# Patient Record
Sex: Female | Born: 1935
Health system: Southern US, Community
[De-identification: ages and names within clinical notes are randomized; demographics above are authoritative.]

## PROBLEM LIST (undated history)

## (undated) DIAGNOSIS — K279 Peptic ulcer, site unspecified, unspecified as acute or chronic, without hemorrhage or perforation: Secondary | ICD-10-CM

## (undated) DIAGNOSIS — R0600 Dyspnea, unspecified: Secondary | ICD-10-CM

## (undated) DIAGNOSIS — M1990 Unspecified osteoarthritis, unspecified site: Secondary | ICD-10-CM

## (undated) DIAGNOSIS — N189 Chronic kidney disease, unspecified: Secondary | ICD-10-CM

## (undated) DIAGNOSIS — Z8639 Personal history of other endocrine, nutritional and metabolic disease: Secondary | ICD-10-CM

## (undated) DIAGNOSIS — I350 Nonrheumatic aortic (valve) stenosis: Secondary | ICD-10-CM

## (undated) DIAGNOSIS — F419 Anxiety disorder, unspecified: Secondary | ICD-10-CM

## (undated) DIAGNOSIS — D649 Anemia, unspecified: Secondary | ICD-10-CM

## (undated) DIAGNOSIS — I509 Heart failure, unspecified: Secondary | ICD-10-CM

## (undated) DIAGNOSIS — I739 Peripheral vascular disease, unspecified: Secondary | ICD-10-CM

## (undated) DIAGNOSIS — K922 Gastrointestinal hemorrhage, unspecified: Secondary | ICD-10-CM

## (undated) DIAGNOSIS — K219 Gastro-esophageal reflux disease without esophagitis: Secondary | ICD-10-CM

## (undated) DIAGNOSIS — R519 Headache, unspecified: Secondary | ICD-10-CM

## (undated) DIAGNOSIS — E785 Hyperlipidemia, unspecified: Secondary | ICD-10-CM

## (undated) DIAGNOSIS — I4891 Unspecified atrial fibrillation: Secondary | ICD-10-CM

## (undated) DIAGNOSIS — N2581 Secondary hyperparathyroidism of renal origin: Secondary | ICD-10-CM

## (undated) DIAGNOSIS — I10 Essential (primary) hypertension: Secondary | ICD-10-CM

## (undated) DIAGNOSIS — R51 Headache: Secondary | ICD-10-CM

## (undated) HISTORY — DX: Secondary hyperparathyroidism of renal origin: N25.81

## (undated) HISTORY — PX: CHOLECYSTECTOMY: SHX55

## (undated) HISTORY — DX: Anemia, unspecified: D64.9

## (undated) HISTORY — DX: Personal history of other endocrine, nutritional and metabolic disease: Z86.39

## (undated) HISTORY — PX: ABDOMINAL HYSTERECTOMY: SHX81

## (undated) HISTORY — PX: EYE SURGERY: SHX253

## (undated) HISTORY — DX: Essential (primary) hypertension: I10

## (undated) HISTORY — PX: COLON SURGERY: SHX602

## (undated) HISTORY — DX: Chronic kidney disease, unspecified: N18.9

---

## 2008-12-06 ENCOUNTER — Ambulatory Visit (HOSPITAL_COMMUNITY): Admission: RE | Admit: 2008-12-06 | Discharge: 2008-12-06 | Payer: Self-pay | Admitting: Nephrology

## 2011-12-11 ENCOUNTER — Encounter (HOSPITAL_COMMUNITY): Payer: Medicare Other | Attending: Nephrology

## 2011-12-11 VITALS — BP 162/78 | HR 67 | Temp 98.5°F

## 2011-12-11 DIAGNOSIS — D631 Anemia in chronic kidney disease: Secondary | ICD-10-CM | POA: Insufficient documentation

## 2011-12-11 MED ORDER — EPOETIN ALFA 10000 UNIT/ML IJ SOLN
6000.0000 [IU] | Freq: Once | INTRAMUSCULAR | Status: AC
  Administered 2011-12-11: 6000 [IU] via SUBCUTANEOUS

## 2011-12-11 NOTE — Progress Notes (Signed)
Patricia Ayala presents today for injection per MD orders. Procrit 6000 administered SQ in right Abdomen. Administration without incident. Patient tolerated well.

## 2011-12-25 ENCOUNTER — Encounter (HOSPITAL_COMMUNITY): Payer: Medicare Other

## 2011-12-25 ENCOUNTER — Other Ambulatory Visit (HOSPITAL_COMMUNITY): Payer: Medicare Other

## 2011-12-25 VITALS — BP 131/65 | HR 67

## 2011-12-25 DIAGNOSIS — D649 Anemia, unspecified: Secondary | ICD-10-CM

## 2011-12-25 LAB — RENAL FUNCTION PANEL
CO2: 18 mEq/L — ABNORMAL LOW (ref 19–32)
Calcium: 7.6 mg/dL — ABNORMAL LOW (ref 8.4–10.5)
Chloride: 107 mEq/L (ref 96–112)
Creatinine, Ser: 6.25 mg/dL — ABNORMAL HIGH (ref 0.50–1.10)
Glucose, Bld: 104 mg/dL — ABNORMAL HIGH (ref 70–99)

## 2011-12-25 MED ORDER — EPOETIN ALFA 10000 UNIT/ML IJ SOLN
6000.0000 [IU] | Freq: Once | INTRAMUSCULAR | Status: AC
  Administered 2011-12-25: 6000 [IU] via SUBCUTANEOUS

## 2011-12-25 NOTE — Progress Notes (Signed)
[name] presents today for injection per MD orders. Procrit 6,000 units administered SQ in right Abdomen.  Blood drawn via venipuncture for labs ordered per Dr. Fausto Skillern. Administration without incident. Patient tolerated well.

## 2012-01-07 ENCOUNTER — Encounter (HOSPITAL_COMMUNITY): Payer: Medicare Other

## 2012-01-07 ENCOUNTER — Encounter (HOSPITAL_COMMUNITY): Payer: Medicare Other | Attending: Nephrology

## 2012-01-07 VITALS — BP 129/70 | HR 69

## 2012-01-07 DIAGNOSIS — N289 Disorder of kidney and ureter, unspecified: Secondary | ICD-10-CM | POA: Insufficient documentation

## 2012-01-07 DIAGNOSIS — D649 Anemia, unspecified: Secondary | ICD-10-CM | POA: Insufficient documentation

## 2012-01-07 MED ORDER — EPOETIN ALFA 10000 UNIT/ML IJ SOLN
6000.0000 [IU] | Freq: Once | INTRAMUSCULAR | Status: AC
  Administered 2012-01-07: 6000 [IU] via SUBCUTANEOUS
  Filled 2012-01-07: qty 1

## 2012-01-07 NOTE — Progress Notes (Signed)
[name] presents today for injection per MD orders. Procrit 6000 units administered SQ in right Abdomen. Administration without incident. Patient tolerated well.

## 2012-01-08 ENCOUNTER — Other Ambulatory Visit (HOSPITAL_COMMUNITY): Payer: Medicare Other

## 2012-01-21 ENCOUNTER — Ambulatory Visit (HOSPITAL_COMMUNITY): Payer: Medicare Other

## 2012-02-11 ENCOUNTER — Encounter (HOSPITAL_COMMUNITY): Payer: Medicare Other | Attending: Nephrology

## 2012-02-11 ENCOUNTER — Encounter (HOSPITAL_COMMUNITY): Payer: Self-pay

## 2012-02-11 VITALS — BP 145/73 | HR 66

## 2012-02-11 DIAGNOSIS — D649 Anemia, unspecified: Secondary | ICD-10-CM

## 2012-02-11 HISTORY — DX: Anemia, unspecified: D64.9

## 2012-02-11 MED ORDER — EPOETIN ALFA 10000 UNIT/ML IJ SOLN
6000.0000 [IU] | Freq: Once | INTRAMUSCULAR | Status: AC
  Administered 2012-02-11: 6000 [IU] via SUBCUTANEOUS

## 2012-02-11 NOTE — Progress Notes (Signed)
Tolerated well

## 2012-02-25 ENCOUNTER — Encounter (HOSPITAL_COMMUNITY): Payer: Medicare Other

## 2012-02-25 VITALS — BP 137/77 | HR 62

## 2012-02-25 DIAGNOSIS — D649 Anemia, unspecified: Secondary | ICD-10-CM

## 2012-02-25 MED ORDER — EPOETIN ALFA 10000 UNIT/ML IJ SOLN
6000.0000 [IU] | Freq: Once | INTRAMUSCULAR | Status: AC
  Administered 2012-02-25: 6000 [IU] via SUBCUTANEOUS

## 2012-02-25 NOTE — Progress Notes (Signed)
Patricia Ayala presents today for injection per MD orders. Procrit 6000 units administered SQ in right Abdomen. Administration without incident. Patient tolerated well. Per pt and her husband she has her lab work done at [facility] office and just had it done last week and is going again this week.

## 2012-03-06 ENCOUNTER — Other Ambulatory Visit: Payer: Self-pay

## 2012-03-06 DIAGNOSIS — Z01810 Encounter for preprocedural cardiovascular examination: Secondary | ICD-10-CM

## 2012-03-06 DIAGNOSIS — N185 Chronic kidney disease, stage 5: Secondary | ICD-10-CM

## 2012-03-10 ENCOUNTER — Ambulatory Visit (HOSPITAL_COMMUNITY): Payer: Medicare Other

## 2012-03-11 ENCOUNTER — Encounter (HOSPITAL_COMMUNITY): Payer: Medicare Other | Attending: Nephrology

## 2012-03-11 VITALS — BP 139/75 | HR 60

## 2012-03-11 DIAGNOSIS — D649 Anemia, unspecified: Secondary | ICD-10-CM | POA: Insufficient documentation

## 2012-03-11 MED ORDER — EPOETIN ALFA 10000 UNIT/ML IJ SOLN
10000.0000 [IU] | INTRAMUSCULAR | Status: DC
  Administered 2012-03-11: 6000 [IU] via SUBCUTANEOUS

## 2012-03-11 NOTE — Progress Notes (Signed)
Tolerated injection well. 

## 2012-03-14 ENCOUNTER — Encounter: Payer: Self-pay | Admitting: Vascular Surgery

## 2012-03-25 ENCOUNTER — Ambulatory Visit (HOSPITAL_COMMUNITY): Payer: Medicare Other

## 2012-03-26 ENCOUNTER — Encounter (HOSPITAL_COMMUNITY): Payer: Medicare Other

## 2012-03-26 ENCOUNTER — Encounter: Payer: Self-pay | Admitting: Vascular Surgery

## 2012-03-26 VITALS — BP 133/68 | HR 69

## 2012-03-26 DIAGNOSIS — D649 Anemia, unspecified: Secondary | ICD-10-CM

## 2012-03-26 MED ORDER — EPOETIN ALFA 10000 UNIT/ML IJ SOLN
10000.0000 [IU] | Freq: Once | INTRAMUSCULAR | Status: AC
  Administered 2012-03-26: 6000 [IU] via SUBCUTANEOUS

## 2012-03-26 NOTE — Progress Notes (Signed)
VSS,  Tolerated injection well.  Refused lab work.  Stated she will "get it done at Dr. Susa Griffins office."

## 2012-03-27 ENCOUNTER — Encounter: Payer: Self-pay | Admitting: Vascular Surgery

## 2012-03-27 ENCOUNTER — Encounter (INDEPENDENT_AMBULATORY_CARE_PROVIDER_SITE_OTHER): Payer: Medicare Other | Admitting: Vascular Surgery

## 2012-03-27 ENCOUNTER — Ambulatory Visit (INDEPENDENT_AMBULATORY_CARE_PROVIDER_SITE_OTHER): Payer: Medicare Other | Admitting: Vascular Surgery

## 2012-03-27 VITALS — BP 161/77 | HR 66 | Temp 98.0°F | Ht 64.0 in | Wt 178.0 lb

## 2012-03-27 DIAGNOSIS — N186 End stage renal disease: Secondary | ICD-10-CM

## 2012-03-27 DIAGNOSIS — N185 Chronic kidney disease, stage 5: Secondary | ICD-10-CM

## 2012-03-27 DIAGNOSIS — Z01810 Encounter for preprocedural cardiovascular examination: Secondary | ICD-10-CM

## 2012-03-27 NOTE — Progress Notes (Signed)
[facility] HISTORY AND PHYSICAL   History of Present Illness:  Patient is a 78 y.o. year old female who presents for placement of a permanent hemodialysis access. The patient is right handed .  The patient is not currently on hemodialysis.  Other chronic medical problems include anemia, hypertension, hyperparathyroidism. These problems are currently controlled.  She is referred by Dr. Befakadu  Past Medical History  Diagnosis Date  . Anemia 02/11/2012  . Chronic kidney disease   . Hypertension   . Vitamin d deficiency   . Hyperparathyroidism, secondary   . H/O metabolic acidosis     History reviewed. No pertinent past surgical history.   Social History History  Substance Use Topics  . Smoking status: Never Smoker   . Smokeless tobacco: Never Used  . Alcohol Use: No    Family History History reviewed. No pertinent family history.  Allergies  No Known Allergies   Current Outpatient Prescriptions  Medication Sig Dispense Refill  . amLODipine (NORVASC) 10 MG tablet Take 10 mg by mouth daily.      . calcitRIOL (ROCALTROL) 0.5 MCG capsule Take 0.5 mcg by mouth daily.      . ergocalciferol (VITAMIN D2) 50000 UNITS capsule Take 50,000 Units by mouth once a week.      . labetalol (NORMODYNE) 200 MG tablet Take 200 mg by mouth 2 (two) times daily.      . promethazine (PHENERGAN) 25 MG tablet Take 25 mg by mouth every 6 (six) hours as needed.      . torsemide (DEMADEX) 20 MG tablet Take 20 mg by mouth daily. 2 tablets      . ALPRAZolam (XANAX) 0.5 MG tablet Take 0.5 mg by mouth at bedtime as needed.      . amLODipine (NORVASC) 5 MG tablet Take 5 mg by mouth daily.      . calcitRIOL (ROCALTROL) 1 MCG/ML solution Take 0.25 mcg by mouth daily.      . doxazosin (CARDURA) 2 MG tablet Take 2 mg by mouth at bedtime.      . triazolam (HALCION) 0.25 MG tablet Take 0.25 mg by mouth at bedtime as needed.        ROS:   General:  No weight loss, Fever,  chills  HEENT: No recent headaches, no nasal bleeding, no visual changes, no sore throat  Neurologic: No dizziness, blackouts, seizures. No recent symptoms of stroke or mini- stroke. No recent episodes of slurred speech, or temporary blindness.  Cardiac: No recent episodes of chest pain/pressure, no shortness of breath at rest.  No shortness of breath with exertion.  Denies history of atrial fibrillation or irregular heartbeat  Vascular: No history of rest pain in feet.  No history of claudication.  No history of non-healing ulcer, No history of DVT   Pulmonary: No home oxygen, no productive cough, no hemoptysis,  No asthma or wheezing  Musculoskeletal:  [ ] Arthritis, [ ] Low back pain,  [ ] Joint pain  Hematologic:No history of hypercoagulable state.  No history of easy bleeding.  No history of anemia  Gastrointestinal: No hematochezia or melena,  No gastroesophageal reflux, no trouble swallowing  Urinary: [x ] chronic Kidney disease, [ ] on HD - [ ] MWF or [ ] TTHS, [ ] Burning with urination, [ ] Frequent urination, [ ] Difficulty urinating;   Skin: No rashes  Psychological: No history of anxiety,  No history of depression   Physical Examination  Filed Vitals:     03/27/12 1552  BP: 161/77  Pulse: 66  Temp: 98 F (36.7 C)  TempSrc: Oral  Height: 5' 4" (1.626 m)  Weight: 178 lb (80.74 kg)    Body mass index is 30.55 kg/(m^2).  General:  Alert and oriented, no acute distress HEENT: Normal Neck: No bruit or JVD Pulmonary: Clear to auscultation bilaterally Cardiac: Regular Rate and Rhythm without murmur Gastrointestinal: Soft, non-tender, non-distended, no mass Skin: No rash Extremity Pulses:  2+ radial, brachial pulses bilaterally Musculoskeletal: No deformity or edema  Neurologic: Upper and lower extremity motor 5/5 and symmetric  DATA: She had a vein mapping ultrasound today which I reviewed and interpreted. The basilic vein was of reasonable quality between 3 and  5 mm bilaterally. The cephalic vein was fairly small bilaterally. However, on placement of a tourniquet on the left upper arm the cephalic vein was of reasonable quality   ASSESSMENT: Left brachiocephalic AV fistula Tuesday, 04/01/2012. Risks benefits possible complications and procedure details were explained to the patient and her family today. She understands and agrees to proceed. These include but are not limited to bleeding infection ischemic steal non-maturation of the fistula.   PLAN: See above  Kiora Hallberg, MD [facility] Office: [phone] Pager: [phone]  

## 2012-03-28 ENCOUNTER — Other Ambulatory Visit: Payer: Self-pay

## 2012-03-31 ENCOUNTER — Encounter (HOSPITAL_COMMUNITY): Payer: Self-pay | Admitting: Pharmacy Technician

## 2012-03-31 ENCOUNTER — Encounter (HOSPITAL_COMMUNITY): Payer: Self-pay | Admitting: *Deleted

## 2012-03-31 MED ORDER — CEFAZOLIN SODIUM 1-5 GM-% IV SOLN
1.0000 g | Freq: Once | INTRAVENOUS | Status: DC
  Filled 2012-03-31: qty 50

## 2012-03-31 MED ORDER — SODIUM CHLORIDE 0.9 % IV SOLN
INTRAVENOUS | Status: DC
  Administered 2012-04-01: 08:00:00 via INTRAVENOUS

## 2012-04-01 ENCOUNTER — Telehealth: Payer: Self-pay | Admitting: Vascular Surgery

## 2012-04-01 ENCOUNTER — Encounter (HOSPITAL_COMMUNITY)
Admission: RE | Disposition: A | Discharge status: Home / Self Care | Payer: Self-pay | Source: Ambulatory Visit | Attending: Vascular Surgery

## 2012-04-01 ENCOUNTER — Other Ambulatory Visit: Payer: Self-pay

## 2012-04-01 ENCOUNTER — Encounter (HOSPITAL_COMMUNITY): Payer: Self-pay

## 2012-04-01 ENCOUNTER — Ambulatory Visit (HOSPITAL_COMMUNITY): Payer: Medicare Other

## 2012-04-01 ENCOUNTER — Ambulatory Visit (HOSPITAL_COMMUNITY): Payer: Medicare Other | Admitting: Anesthesiology

## 2012-04-01 ENCOUNTER — Encounter (HOSPITAL_COMMUNITY): Payer: Self-pay | Admitting: Anesthesiology

## 2012-04-01 ENCOUNTER — Ambulatory Visit (HOSPITAL_COMMUNITY)
Admission: RE | Admit: 2012-04-01 | Discharge: 2012-04-01 | Disposition: A | Discharge status: Home / Self Care | Payer: Medicare Other | Source: Ambulatory Visit | Attending: Vascular Surgery | Admitting: Vascular Surgery

## 2012-04-01 DIAGNOSIS — I120 Hypertensive chronic kidney disease with stage 5 chronic kidney disease or end stage renal disease: Secondary | ICD-10-CM | POA: Insufficient documentation

## 2012-04-01 DIAGNOSIS — N2581 Secondary hyperparathyroidism of renal origin: Secondary | ICD-10-CM | POA: Insufficient documentation

## 2012-04-01 DIAGNOSIS — N186 End stage renal disease: Secondary | ICD-10-CM

## 2012-04-01 DIAGNOSIS — K219 Gastro-esophageal reflux disease without esophagitis: Secondary | ICD-10-CM | POA: Insufficient documentation

## 2012-04-01 HISTORY — PX: AV FISTULA PLACEMENT: SHX1204

## 2012-04-01 LAB — SURGICAL PCR SCREEN: Staphylococcus aureus: NEGATIVE

## 2012-04-01 SURGERY — ARTERIOVENOUS (AV) FISTULA CREATION
Anesthesia: General | Site: Arm Upper | Laterality: Left | Wound class: Clean

## 2012-04-01 MED ORDER — PHENYLEPHRINE HCL 10 MG/ML IJ SOLN
INTRAMUSCULAR | Status: DC | PRN
  Administered 2012-04-01 (×2): 40 ug via INTRAVENOUS

## 2012-04-01 MED ORDER — MUPIROCIN 2 % EX OINT
TOPICAL_OINTMENT | CUTANEOUS | Status: AC
  Administered 2012-04-01: 1 via NASAL
  Filled 2012-04-01: qty 22

## 2012-04-01 MED ORDER — 0.9 % SODIUM CHLORIDE (POUR BTL) OPTIME
TOPICAL | Status: DC | PRN
  Administered 2012-04-01: 1000 mL

## 2012-04-01 MED ORDER — HEPARIN SODIUM (PORCINE) 5000 UNIT/ML IJ SOLN
INTRAMUSCULAR | Status: DC | PRN
  Administered 2012-04-01: 09:00:00

## 2012-04-01 MED ORDER — FENTANYL CITRATE 0.05 MG/ML IJ SOLN
INTRAMUSCULAR | Status: DC | PRN
  Administered 2012-04-01: 25 ug via INTRAVENOUS
  Administered 2012-04-01: 75 ug via INTRAVENOUS

## 2012-04-01 MED ORDER — EPHEDRINE SULFATE 50 MG/ML IJ SOLN
INTRAMUSCULAR | Status: DC | PRN
  Administered 2012-04-01 (×4): 5 mg via INTRAVENOUS

## 2012-04-01 MED ORDER — OXYCODONE HCL 5 MG PO TABS: 5.0000 mg | ORAL_TABLET | Freq: Four times a day (QID) | ORAL | Status: DC | PRN

## 2012-04-01 MED ORDER — GLYCOPYRROLATE 0.2 MG/ML IJ SOLN
INTRAMUSCULAR | Status: DC | PRN
  Administered 2012-04-01: 0.2 mg via INTRAVENOUS

## 2012-04-01 MED ORDER — ONDANSETRON HCL 4 MG/2ML IJ SOLN: 4.0000 mg | Freq: Once | INTRAMUSCULAR | Status: DC | PRN

## 2012-04-01 MED ORDER — CEFAZOLIN SODIUM-DEXTROSE 2-3 GM-% IV SOLR
INTRAVENOUS | Status: AC
  Filled 2012-04-01: qty 50

## 2012-04-01 MED ORDER — HYDROMORPHONE HCL PF 1 MG/ML IJ SOLN: 0.2500 mg | INTRAMUSCULAR | Status: DC | PRN

## 2012-04-01 MED ORDER — HEPARIN SODIUM (PORCINE) 1000 UNIT/ML IJ SOLN
INTRAMUSCULAR | Status: DC | PRN
  Administered 2012-04-01: 5000 [IU] via INTRAVENOUS

## 2012-04-01 MED ORDER — CEFAZOLIN SODIUM-DEXTROSE 2-3 GM-% IV SOLR
2.0000 g | Freq: Once | INTRAVENOUS | Status: AC
  Administered 2012-04-01: 2 g via INTRAVENOUS

## 2012-04-01 MED ORDER — ONDANSETRON HCL 4 MG/2ML IJ SOLN
INTRAMUSCULAR | Status: DC | PRN
  Administered 2012-04-01: 4 mg via INTRAVENOUS

## 2012-04-01 MED ORDER — LIDOCAINE HCL (CARDIAC) 20 MG/ML IV SOLN
INTRAVENOUS | Status: DC | PRN
  Administered 2012-04-01: 100 mg via INTRAVENOUS

## 2012-04-01 MED ORDER — LIDOCAINE HCL (PF) 1 % IJ SOLN
INTRAMUSCULAR | Status: DC | PRN
  Administered 2012-04-01: 7 mL

## 2012-04-01 MED ORDER — MUPIROCIN 2 % EX OINT: TOPICAL_OINTMENT | Freq: Two times a day (BID) | CUTANEOUS | Status: DC

## 2012-04-01 MED ORDER — PROPOFOL 10 MG/ML IV EMUL
INTRAVENOUS | Status: DC | PRN
  Administered 2012-04-01: 110 mg via INTRAVENOUS

## 2012-04-01 SURGICAL SUPPLY — 43 items
CANISTER SUCTION 2500CC (MISCELLANEOUS) ×2 IMPLANT
CLIP TI MEDIUM 6 (CLIP) ×2 IMPLANT
CLIP TI WIDE RED SMALL 6 (CLIP) ×2 IMPLANT
CLOTH BEACON ORANGE TIMEOUT ST (SAFETY) ×2 IMPLANT
COVER PROBE W GEL 5X96 (DRAPES) ×2 IMPLANT
COVER SURGICAL LIGHT HANDLE (MISCELLANEOUS) ×2 IMPLANT
DECANTER SPIKE VIAL GLASS SM (MISCELLANEOUS) ×2 IMPLANT
DERMABOND ADVANCED (GAUZE/BANDAGES/DRESSINGS) ×1
DERMABOND ADVANCED .7 DNX12 (GAUZE/BANDAGES/DRESSINGS) ×1 IMPLANT
DRAIN PENROSE 1/4X12 LTX STRL (WOUND CARE) ×2 IMPLANT
ELECT REM PT RETURN 9FT ADLT (ELECTROSURGICAL) ×2
ELECTRODE REM PT RTRN 9FT ADLT (ELECTROSURGICAL) ×1 IMPLANT
GAUZE SPONGE 2X2 8PLY STRL LF (GAUZE/BANDAGES/DRESSINGS) IMPLANT
GEL ULTRASOUND 20GR AQUASONIC (MISCELLANEOUS) IMPLANT
GLOVE BIO SURGEON STRL SZ 6.5 (GLOVE) ×4 IMPLANT
GLOVE BIO SURGEON STRL SZ7.5 (GLOVE) ×2 IMPLANT
GLOVE BIOGEL PI IND STRL 6.5 (GLOVE) ×2 IMPLANT
GLOVE BIOGEL PI IND STRL 7.0 (GLOVE) ×1 IMPLANT
GLOVE BIOGEL PI IND STRL 7.5 (GLOVE) ×2 IMPLANT
GLOVE BIOGEL PI INDICATOR 6.5 (GLOVE) ×2
GLOVE BIOGEL PI INDICATOR 7.0 (GLOVE) ×1
GLOVE BIOGEL PI INDICATOR 7.5 (GLOVE) ×2
GLOVE ECLIPSE 6.5 STRL STRAW (GLOVE) ×2 IMPLANT
GLOVE SURG SS PI 7.5 STRL IVOR (GLOVE) ×4 IMPLANT
GOWN EXTRA PROTECTION XXL 0583 (GOWNS) ×2 IMPLANT
GOWN PREVENTION PLUS XLARGE (GOWN DISPOSABLE) ×2 IMPLANT
GOWN STRL NON-REIN LRG LVL3 (GOWN DISPOSABLE) ×4 IMPLANT
KIT BASIN OR (CUSTOM PROCEDURE TRAY) ×2 IMPLANT
KIT ROOM TURNOVER OR (KITS) ×2 IMPLANT
LOOP VESSEL MINI RED (MISCELLANEOUS) IMPLANT
NS IRRIG 1000ML POUR BTL (IV SOLUTION) ×2 IMPLANT
PACK CV ACCESS (CUSTOM PROCEDURE TRAY) ×2 IMPLANT
PAD ARMBOARD 7.5X6 YLW CONV (MISCELLANEOUS) ×4 IMPLANT
SPONGE GAUZE 2X2 STER 10/PKG (GAUZE/BANDAGES/DRESSINGS)
SPONGE SURGIFOAM ABS GEL 100 (HEMOSTASIS) IMPLANT
SUT PROLENE 7 0 BV 1 (SUTURE) ×2 IMPLANT
SUT VIC AB 3-0 SH 27 (SUTURE) ×1
SUT VIC AB 3-0 SH 27X BRD (SUTURE) ×1 IMPLANT
SUT VICRYL 4-0 PS2 18IN ABS (SUTURE) ×2 IMPLANT
TOWEL OR 17X24 6PK STRL BLUE (TOWEL DISPOSABLE) ×2 IMPLANT
TOWEL OR 17X26 10 PK STRL BLUE (TOWEL DISPOSABLE) ×2 IMPLANT
UNDERPAD 30X30 INCONTINENT (UNDERPADS AND DIAPERS) ×2 IMPLANT
WATER STERILE IRR 1000ML POUR (IV SOLUTION) ×2 IMPLANT

## 2012-04-01 NOTE — Anesthesia Postprocedure Evaluation (Signed)
  Anesthesia Post-op Note  Patient: Patricia Ayala  Procedure(s) Performed: Procedure(s) (LRB): ARTERIOVENOUS (AV) FISTULA CREATION (Left)  Patient Location: PACU  Anesthesia Type: General  Level of Consciousness: awake, alert , oriented and patient cooperative  Airway and Oxygen Therapy: Patient Spontanous Breathing and Patient connected to nasal cannula oxygen  Post-op Pain: mild  Post-op Assessment: Post-op Vital signs reviewed, Patient's Cardiovascular Status Stable, Respiratory Function Stable, Patent Airway, No signs of Nausea or vomiting and Pain level controlled  Post-op Vital Signs: stable  Complications: No apparent anesthesia complications

## 2012-04-01 NOTE — Transfer of Care (Signed)
Immediate Anesthesia Transfer of Care Note  Patient: Patricia Ayala  Procedure(s) Performed: Procedure(s) (LRB): ARTERIOVENOUS (AV) FISTULA CREATION (Left)  Patient Location: PACU  Anesthesia Type: General  Level of Consciousness: awake, oriented and patient cooperative  Airway & Oxygen Therapy: Patient Spontanous Breathing and Patient connected to nasal cannula oxygen  Post-op Assessment: Report given to PACU RN and Post -op Vital signs reviewed and stable  Post vital signs: Reviewed  Complications: No apparent anesthesia complications

## 2012-04-01 NOTE — Anesthesia Procedure Notes (Signed)
Procedure Name: LMA Insertion Date/Time: 04/01/2012 8:44 AM Performed by: Romie Minus K Pre-anesthesia Checklist: Patient identified, Emergency Drugs available, Suction available, Patient being monitored and Timeout performed Patient Re-evaluated:Patient Re-evaluated prior to inductionOxygen Delivery Method: Circle system utilized Preoxygenation: Pre-oxygenation with 100% oxygen Intubation Type: IV induction Ventilation: Mask ventilation without difficulty LMA: LMA inserted LMA Size: 4.0 Number of attempts: 1 Placement Confirmation: positive ETCO2 and breath sounds checked- equal and bilateral Tube secured with: Tape Dental Injury: Teeth and Oropharynx as per pre-operative assessment

## 2012-04-01 NOTE — Discharge Instructions (Signed)
    04/01/2012 Patricia Ayala [id number] [birth year]  Surgeon(s): Sherren Kerns, MD  Procedure(s): ARTERIOVENOUS (AV) FISTULA CREATION-left brachiocephalic AVF        X Do not stick graft for 12 weeks

## 2012-04-01 NOTE — Preoperative (Signed)
Beta Blockers   Reason not to administer Beta Blockers:Not Applicable 

## 2012-04-01 NOTE — OR Nursing (Signed)
Late entry at 1155 to document fistula.

## 2012-04-01 NOTE — Telephone Encounter (Addendum)
Message copied by Rosalyn Charters on Tue Apr 01, 2012 11:19 AM ------      Message from: Sherren Kerns      Created: Tue Apr 01, 2012 10:57 AM       Left brachial cephalic AVF      Rhyne asst            Needs follow up in 1 month            Charles  NO ANSWERING MACHINE - MAILED APPT. INFO FOR 05-08-12

## 2012-04-01 NOTE — Interval H&P Note (Signed)
History and Physical Interval Note:  04/01/2012 7:35 AM  Patricia Ayala  has presented today for surgery, with the diagnosis of End Stage Renal Disease  The various methods of treatment have been discussed with the patient and family. After consideration of risks, benefits and other options for treatment, the patient has consented to  Procedure(s) (LRB): ARTERIOVENOUS (AV) FISTULA CREATION (Left) as a surgical intervention .  The patients' history has been reviewed, patient examined, no change in status, stable for surgery.  I have reviewed the patients' chart and labs.  Questions were answered to the patient's satisfaction.     Rudolf Blizard E

## 2012-04-01 NOTE — Anesthesia Preprocedure Evaluation (Addendum)
Anesthesia Evaluation  Patient identified by MRN, date of birth, ID band Patient awake    Reviewed: Allergy & Precautions, H&P , NPO status , Patient's Chart, lab work & pertinent test results, reviewed documented beta blocker date and time   History of Anesthesia Complications Negative for: history of anesthetic complications  Airway Mallampati: I TM Distance: >3 FB Neck ROM: full    Dental  (+) Teeth Intact and Dental Advisory Given   Pulmonary neg pulmonary ROS,          Cardiovascular hypertension, Pt. on medications and Pt. on home beta blockers Rhythm:regular Rate:Normal     Neuro/Psych negative neurological ROS     GI/Hepatic GERD-  Medicated and Controlled,  Endo/Other    Renal/GU ESRF and CRFRenal diseasePatient states she has not started dialysis.     Musculoskeletal   Abdominal   Peds  Hematology   Anesthesia Other Findings   Reproductive/Obstetrics (+) Breast feeding                           Anesthesia Physical Anesthesia Plan  ASA: III  Anesthesia Plan: General   Post-op Pain Management:    Induction: Intravenous  Airway Management Planned: LMA and Oral ETT  Additional Equipment:   Intra-op Plan:   Post-operative Plan: Extubation in OR  Informed Consent: I have reviewed the patients History and Physical, chart, labs and discussed the procedure including the risks, benefits and alternatives for the proposed anesthesia with the patient or authorized representative who has indicated his/her understanding and acceptance.     Plan Discussed with: CRNA, Anesthesiologist and Surgeon  Anesthesia Plan Comments:         Anesthesia Quick Evaluation

## 2012-04-01 NOTE — H&P (View-Only) (Signed)
[facility] [place] HISTORY AND PHYSICAL   History of Present Illness:  Patient is a 78 y.o. year old female who presents for placement of a permanent hemodialysis access. The patient is right handed .  The patient is not currently on hemodialysis.  Other chronic medical problems include anemia, hypertension, hyperparathyroidism. These problems are currently controlled.  She is referred by Dr. Fausto Skillern  Past Medical History  Diagnosis Date  . Anemia 02/11/2012  . Chronic kidney disease   . Hypertension   . Vitamin d deficiency   . Hyperparathyroidism, secondary   . H/O metabolic acidosis     History reviewed. No pertinent past surgical history.   Social History History  Substance Use Topics  . Smoking status: Never Smoker   . Smokeless tobacco: Never Used  . Alcohol Use: No    Family History History reviewed. No pertinent family history.  Allergies  No Known Allergies   Current Outpatient Prescriptions  Medication Sig Dispense Refill  . amLODipine (NORVASC) 10 MG tablet Take 10 mg by mouth daily.      . calcitRIOL (ROCALTROL) 0.5 MCG capsule Take 0.5 mcg by mouth daily.      . ergocalciferol (VITAMIN D2) 50000 UNITS capsule Take 50,000 Units by mouth once a week.      . labetalol (NORMODYNE) 200 MG tablet Take 200 mg by mouth 2 (two) times daily.      . promethazine (PHENERGAN) 25 MG tablet Take 25 mg by mouth every 6 (six) hours as needed.      . torsemide (DEMADEX) 20 MG tablet Take 20 mg by mouth daily. 2 tablets      . ALPRAZolam (XANAX) 0.5 MG tablet Take 0.5 mg by mouth at bedtime as needed.      Marland Kitchen amLODipine (NORVASC) 5 MG tablet Take 5 mg by mouth daily.      . calcitRIOL (ROCALTROL) 1 MCG/ML solution Take 0.25 mcg by mouth daily.      Marland Kitchen doxazosin (CARDURA) 2 MG tablet Take 2 mg by mouth at bedtime.      . triazolam (HALCION) 0.25 MG tablet Take 0.25 mg by mouth at bedtime as needed.        ROS:   General:  No weight loss, Fever,  chills  HEENT: No recent headaches, no nasal bleeding, no visual changes, no sore throat  Neurologic: No dizziness, blackouts, seizures. No recent symptoms of stroke or mini- stroke. No recent episodes of slurred speech, or temporary blindness.  Cardiac: No recent episodes of chest pain/pressure, no shortness of breath at rest.  No shortness of breath with exertion.  Denies history of atrial fibrillation or irregular heartbeat  Vascular: No history of rest pain in feet.  No history of claudication.  No history of non-healing ulcer, No history of DVT   Pulmonary: No home oxygen, no productive cough, no hemoptysis,  No asthma or wheezing  Musculoskeletal:  [ ]  Arthritis, [ ]  Low back pain,  [ ]  Joint pain  Hematologic:No history of hypercoagulable state.  No history of easy bleeding.  No history of anemia  Gastrointestinal: No hematochezia or melena,  No gastroesophageal reflux, no trouble swallowing  Urinary: [x ] chronic Kidney disease, [ ]  on HD - [ ]  MWF or [ ]  TTHS, [ ]  Burning with urination, [ ]  Frequent urination, [ ]  Difficulty urinating;   Skin: No rashes  Psychological: No history of anxiety,  No history of depression   Physical Examination  Filed Vitals:  03/27/12 1552  BP: 161/77  Pulse: 66  Temp: 98 F (36.7 C)  TempSrc: Oral  Height: 5\' 4"  (1.626 m)  Weight: 178 lb (80.74 kg)    Body mass index is 30.55 kg/(m^2).  General:  Alert and oriented, no acute distress HEENT: Normal Neck: No bruit or JVD Pulmonary: Clear to auscultation bilaterally Cardiac: Regular Rate and Rhythm without murmur Gastrointestinal: Soft, non-tender, non-distended, no mass Skin: No rash Extremity Pulses:  2+ radial, brachial pulses bilaterally Musculoskeletal: No deformity or edema  Neurologic: Upper and lower extremity motor 5/5 and symmetric  DATA: She had a vein mapping ultrasound today which I reviewed and interpreted. The basilic vein was of reasonable quality between 3 and  5 mm bilaterally. The cephalic vein was fairly small bilaterally. However, on placement of a tourniquet on the left upper arm the cephalic vein was of reasonable quality   ASSESSMENT: Left brachiocephalic AV fistula Tuesday, 04/01/2012. Risks benefits possible complications and procedure details were explained to the patient and her family today. She understands and agrees to proceed. These include but are not limited to bleeding infection ischemic steal non-maturation of the fistula.   PLAN: See above  Fabienne Bruns, MD [facility] [place] [facility] Office: [phone] Pager: [phone]

## 2012-04-01 NOTE — Op Note (Signed)
Procedure: Left Brachial Cephalic AV fistula  Preop: ESRD  Postop: ESRD  Anesthesia: General  Assistant:Samantha Rhyne, PA-C  Findings: 3 mm cephalic vein  Procedure: After obtaining informed consent, the patient was taken to the operating room.  After induction of general anesthesia, the left upper extremity was prepped and draped in usual sterile fashion.  A transverse incision was then made near the antecubital crease the left arm. The incision was carried into the subcutaneous tissues down to level of the cephalic vein. The cephalic vein was approximately 3 mm in diameter. It was of good quality. This was dissected free circumferentially and small side branches ligated and divided between silk ties or clips. Next the brachial artery was dissected free in the medial portion of the incision. The artery was  3 mm in diameter. The vessel loops were placed proximal and distal to the planned site of arteriotomy. The patient was given 5000 units of intravenous heparin. After appropriate circulation time, the vessel loops were used to control the artery. A longitudinal opening was made in the brachial artery.  The vein was ligated distally with a 2-0 silk tie. The vein was controlled proximally with a fine bulldog clamp. The vein was then swung over to the artery and sewn end of vein to side of artery using a running 7-0 Prolene suture. Just prior to completion of the anastomosis, everything was fore bled back bled and thoroughly flushed. The anastomosis was secured, vessel loops released, and there was a palpable thrill in the fistula immediately. After hemostasis was obtained, the subcutaneous tissues were reapproximated using a running 3-0 Vicryl suture. The skin was then closed with a 4 Vicryl subcuticular stitch. Dermabond was applied to the skin incision.    Fabienne Bruns, MD [facility] [place] Office: [phone] Pager: [phone]

## 2012-04-02 ENCOUNTER — Other Ambulatory Visit: Payer: Self-pay | Admitting: *Deleted

## 2012-04-02 ENCOUNTER — Encounter (HOSPITAL_COMMUNITY): Payer: Self-pay | Admitting: Vascular Surgery

## 2012-04-02 DIAGNOSIS — N186 End stage renal disease: Secondary | ICD-10-CM

## 2012-04-02 DIAGNOSIS — Z4931 Encounter for adequacy testing for hemodialysis: Secondary | ICD-10-CM

## 2012-04-04 NOTE — Procedures (Unsigned)
CEPHALIC VEIN MAPPING  INDICATION:  Chronic kidney disease stage 5.  HISTORY: Hypertension.  EXAM:  The right cephalic vein is compressible.  Diameter measurements range from 0.18 to 0.41 cm  The right basilic vein is compressible.  Diameter measurements range from 0.37 to 0.55 cm.  The left cephalic vein is compressible.  Diameter measurements range from 0.19 to 0.32 cm.  The left basilic vein is compressible.  Diameter measurements range from 0.25 to 0.40 cm.  See attached worksheet for all measurements.  IMPRESSION: 1. Patent bilateral cephalic and basilic veins with diameter     measurements as described above. 2. Incidental note is made of bilateral radial and ulnar arteries are     followed in continuity into the upper arm segment without a     connection at the antecubital fossa.  ___________________________________________ Janetta Hora. Fields, MD  SH/MEDQ  D:  03/27/2012  T:  03/27/2012  Job:  [id number]

## 2012-04-08 ENCOUNTER — Other Ambulatory Visit: Payer: Self-pay | Admitting: *Deleted

## 2012-04-09 ENCOUNTER — Encounter (HOSPITAL_COMMUNITY): Payer: Self-pay | Admitting: *Deleted

## 2012-04-09 ENCOUNTER — Encounter (HOSPITAL_COMMUNITY): Payer: Self-pay | Admitting: Respiratory Therapy

## 2012-04-09 ENCOUNTER — Ambulatory Visit (HOSPITAL_COMMUNITY): Payer: Medicare Other

## 2012-04-09 MED ORDER — DEXTROSE 5 % IV SOLN
1.5000 g | INTRAVENOUS | Status: AC
  Administered 2012-04-10: 1.5 g via INTRAVENOUS
  Filled 2012-04-09: qty 1.5

## 2012-04-10 ENCOUNTER — Ambulatory Visit (HOSPITAL_COMMUNITY): Payer: Medicare Other

## 2012-04-10 ENCOUNTER — Encounter (HOSPITAL_COMMUNITY): Payer: Self-pay | Admitting: Anesthesiology

## 2012-04-10 ENCOUNTER — Ambulatory Visit (HOSPITAL_COMMUNITY): Payer: Medicare Other | Admitting: Anesthesiology

## 2012-04-10 ENCOUNTER — Ambulatory Visit (HOSPITAL_COMMUNITY)
Admission: RE | Admit: 2012-04-10 | Discharge: 2012-04-10 | Disposition: A | Discharge status: Home / Self Care | Payer: Medicare Other | Source: Ambulatory Visit | Attending: Vascular Surgery | Admitting: Vascular Surgery

## 2012-04-10 ENCOUNTER — Encounter (HOSPITAL_COMMUNITY)
Admission: RE | Disposition: A | Discharge status: Home / Self Care | Payer: Self-pay | Source: Ambulatory Visit | Attending: Vascular Surgery

## 2012-04-10 ENCOUNTER — Encounter (HOSPITAL_COMMUNITY): Payer: Self-pay | Admitting: Surgery

## 2012-04-10 DIAGNOSIS — N2581 Secondary hyperparathyroidism of renal origin: Secondary | ICD-10-CM | POA: Insufficient documentation

## 2012-04-10 DIAGNOSIS — I120 Hypertensive chronic kidney disease with stage 5 chronic kidney disease or end stage renal disease: Secondary | ICD-10-CM | POA: Insufficient documentation

## 2012-04-10 DIAGNOSIS — N186 End stage renal disease: Secondary | ICD-10-CM | POA: Insufficient documentation

## 2012-04-10 HISTORY — DX: Anxiety disorder, unspecified: F41.9

## 2012-04-10 HISTORY — PX: INSERTION OF DIALYSIS CATHETER: SHX1324

## 2012-04-10 LAB — POCT I-STAT 4, (NA,K, GLUC, HGB,HCT)
HCT: 31 % — ABNORMAL LOW (ref 36.0–46.0)
Hemoglobin: 10.5 g/dL — ABNORMAL LOW (ref 12.0–15.0)
Sodium: 139 mEq/L (ref 135–145)

## 2012-04-10 SURGERY — INSERTION OF DIALYSIS CATHETER
Anesthesia: Monitor Anesthesia Care | Site: Neck | Wound class: Clean

## 2012-04-10 MED ORDER — SODIUM CHLORIDE 0.9 % IR SOLN
Status: DC | PRN
  Administered 2012-04-10: 1000 mL

## 2012-04-10 MED ORDER — DEXTROSE 5 % IV SOLN
INTRAVENOUS | Status: DC | PRN
  Administered 2012-04-10: 10:00:00 via INTRAVENOUS

## 2012-04-10 MED ORDER — LIDOCAINE HCL (PF) 1 % IJ SOLN
INTRAMUSCULAR | Status: DC | PRN
  Administered 2012-04-10: 14 mL

## 2012-04-10 MED ORDER — HEPARIN SODIUM (PORCINE) 1000 UNIT/ML IJ SOLN
INTRAMUSCULAR | Status: DC | PRN
  Administered 2012-04-10: 4.2 mL

## 2012-04-10 MED ORDER — SODIUM CHLORIDE 0.9 % IV SOLN
INTRAVENOUS | Status: DC
  Administered 2012-04-10: 35 mL/h via INTRAVENOUS

## 2012-04-10 MED ORDER — SODIUM CHLORIDE 0.9 % IR SOLN
Status: DC | PRN
  Administered 2012-04-10: 10:00:00

## 2012-04-10 MED ORDER — PROPOFOL 10 MG/ML IV EMUL
INTRAVENOUS | Status: DC | PRN
  Administered 2012-04-10: 25 ug/kg/min via INTRAVENOUS

## 2012-04-10 MED ORDER — FENTANYL CITRATE 0.05 MG/ML IJ SOLN
INTRAMUSCULAR | Status: DC | PRN
  Administered 2012-04-10: 50 ug via INTRAVENOUS

## 2012-04-10 MED ORDER — OXYCODONE HCL 5 MG PO TABS: 5.0000 mg | ORAL_TABLET | Freq: Four times a day (QID) | ORAL | Status: AC | PRN

## 2012-04-10 MED ORDER — SODIUM CHLORIDE 0.9 % IV SOLN
INTRAVENOUS | Status: DC | PRN
  Administered 2012-04-10: 09:00:00 via INTRAVENOUS

## 2012-04-10 MED ORDER — LIDOCAINE HCL (CARDIAC) 20 MG/ML IV SOLN
INTRAVENOUS | Status: DC | PRN
  Administered 2012-04-10: 75 mg via INTRAVENOUS

## 2012-04-10 SURGICAL SUPPLY — 41 items
BAG DECANTER FOR FLEXI CONT (MISCELLANEOUS) ×2 IMPLANT
CATH CANNON HEMO 15F 50CM (CATHETERS) IMPLANT
CATH CANNON HEMO 15FR 19 (HEMODIALYSIS SUPPLIES) ×2 IMPLANT
CATH CANNON HEMO 15FR 23CM (HEMODIALYSIS SUPPLIES) IMPLANT
CATH CANNON HEMO 15FR 31CM (HEMODIALYSIS SUPPLIES) IMPLANT
CATH CANNON HEMO 15FR 32CM (HEMODIALYSIS SUPPLIES) IMPLANT
CLOTH BEACON ORANGE TIMEOUT ST (SAFETY) ×2 IMPLANT
COVER PROBE W GEL 5X96 (DRAPES) IMPLANT
COVER SURGICAL LIGHT HANDLE (MISCELLANEOUS) ×2 IMPLANT
DECANTER SPIKE VIAL GLASS SM (MISCELLANEOUS) IMPLANT
DRAPE C-ARM 42X72 X-RAY (DRAPES) ×2 IMPLANT
DRAPE CHEST BREAST 15X10 FENES (DRAPES) ×2 IMPLANT
GAUZE SPONGE 2X2 8PLY STRL LF (GAUZE/BANDAGES/DRESSINGS) ×1 IMPLANT
GAUZE SPONGE 4X4 16PLY XRAY LF (GAUZE/BANDAGES/DRESSINGS) ×2 IMPLANT
GLOVE BIOGEL PI IND STRL 6.5 (GLOVE) ×2 IMPLANT
GLOVE BIOGEL PI INDICATOR 6.5 (GLOVE) ×2
GLOVE SS BIOGEL STRL SZ 7 (GLOVE) ×1 IMPLANT
GLOVE SUPERSENSE BIOGEL SZ 7 (GLOVE) ×1
GLOVE SURG SS PI 6.0 STRL IVOR (GLOVE) ×2 IMPLANT
GOWN STRL NON-REIN LRG LVL3 (GOWN DISPOSABLE) ×4 IMPLANT
KIT BASIN OR (CUSTOM PROCEDURE TRAY) ×2 IMPLANT
KIT ROOM TURNOVER OR (KITS) ×2 IMPLANT
NEEDLE 18GX1X1/2 (RX/OR ONLY) (NEEDLE) ×2 IMPLANT
NEEDLE 22X1 1/2 (OR ONLY) (NEEDLE) ×2 IMPLANT
NEEDLE HYPO 25GX1X1/2 BEV (NEEDLE) ×2 IMPLANT
NS IRRIG 1000ML POUR BTL (IV SOLUTION) ×2 IMPLANT
PACK SURGICAL SETUP 50X90 (CUSTOM PROCEDURE TRAY) ×2 IMPLANT
PAD ARMBOARD 7.5X6 YLW CONV (MISCELLANEOUS) ×4 IMPLANT
SOAP 2 % CHG 4 OZ (WOUND CARE) ×2 IMPLANT
SPONGE GAUZE 2X2 STER 10/PKG (GAUZE/BANDAGES/DRESSINGS) ×1
SUT ETHILON 3 0 PS 1 (SUTURE) ×2 IMPLANT
SUT VICRYL 4-0 PS2 18IN ABS (SUTURE) ×2 IMPLANT
SYR 20CC LL (SYRINGE) ×2 IMPLANT
SYR 30ML LL (SYRINGE) IMPLANT
SYR 5ML LL (SYRINGE) ×6 IMPLANT
SYR CONTROL 10ML LL (SYRINGE) ×2 IMPLANT
SYRINGE 10CC LL (SYRINGE) IMPLANT
TAPE CLOTH SURG 4X10 WHT LF (GAUZE/BANDAGES/DRESSINGS) ×2 IMPLANT
TOWEL OR 17X24 6PK STRL BLUE (TOWEL DISPOSABLE) ×2 IMPLANT
TOWEL OR 17X26 10 PK STRL BLUE (TOWEL DISPOSABLE) ×2 IMPLANT
WATER STERILE IRR 1000ML POUR (IV SOLUTION) ×2 IMPLANT

## 2012-04-10 NOTE — Anesthesia Preprocedure Evaluation (Addendum)
Anesthesia Evaluation   Patient awake    Reviewed: Allergy & Precautions, H&P , NPO status , Patient's Chart, lab work & pertinent test results, reviewed documented beta blocker date and time   Airway Mallampati: II TM Distance: >3 FB Neck ROM: Full    Dental  (+) Partial Lower and Teeth Intact   Pulmonary Recent URI ,          Cardiovascular hypertension, Pt. on home beta blockers     Neuro/Psych    GI/Hepatic GERD-  Controlled and Medicated,  Endo/Other    Renal/GU      Musculoskeletal   Abdominal   Peds  Hematology   Anesthesia Other Findings   Reproductive/Obstetrics                          Anesthesia Physical Anesthesia Plan  ASA: III  Anesthesia Plan: MAC   Post-op Pain Management:    Induction: Intravenous  Airway Management Planned: Simple Face Mask  Additional Equipment:   Intra-op Plan:   Post-operative Plan:   Informed Consent: I have reviewed the patients History and Physical, chart, labs and discussed the procedure including the risks, benefits and alternatives for the proposed anesthesia with the patient or authorized representative who has indicated his/her understanding and acceptance.     Plan Discussed with: CRNA, Anesthesiologist and Surgeon  Anesthesia Plan Comments:         Anesthesia Quick Evaluation

## 2012-04-10 NOTE — Interval H&P Note (Signed)
History and Physical Interval Note:  04/10/2012 9:13 AM  [name]  has presented today for surgery, with the diagnosis of ESRD  The various methods of treatment have been discussed with the patient and family. After consideration of risks, benefits and other options for treatment, the patient has consented to  Procedure(s) (LRB): INSERTION OF DIALYSIS CATHETER (N/A) as a surgical intervention .  The patients' history has been reviewed, patient examined, no change in status, stable for surgery.  I have reviewed the patients' chart and labs.  Questions were answered to the patient's satisfaction.     Patricia Ayala

## 2012-04-10 NOTE — Anesthesia Postprocedure Evaluation (Signed)
  Anesthesia Post-op Note  Patient: Patricia Ayala  Procedure(s) Performed: Procedure(s) (LRB): INSERTION OF DIALYSIS CATHETER (N/A)  Patient Location: PACU  Anesthesia Type: MAC  Level of Consciousness: awake, oriented, sedated and patient cooperative  Airway and Oxygen Therapy: Patient Spontanous Breathing and Patient connected to nasal cannula oxygen  Post-op Pain: mild  Post-op Assessment: Post-op Vital signs reviewed, Patient's Cardiovascular Status Stable, Respiratory Function Stable, Patent Airway, No signs of Nausea or vomiting and Pain level controlled  Post-op Vital Signs: stable  Complications: No apparent anesthesia complications

## 2012-04-10 NOTE — Progress Notes (Signed)
RN admitting pt is [employer].   I am assuming care at this time

## 2012-04-10 NOTE — Transfer of Care (Signed)
Immediate Anesthesia Transfer of Care Note  Patient: Patricia Ayala  Procedure(s) Performed: Procedure(s) (LRB): INSERTION OF DIALYSIS CATHETER (N/A)  Patient Location: PACU  Anesthesia Type: MAC  Level of Consciousness: awake, alert , oriented and patient cooperative  Airway & Oxygen Therapy: Patient Spontanous Breathing and Patient connected to nasal cannula oxygen  Post-op Assessment: Report given to PACU RN, Post -op Vital signs reviewed and stable and Patient moving all extremities  Post vital signs: Reviewed and stable  Complications: No apparent anesthesia complications

## 2012-04-10 NOTE — Op Note (Signed)
OPERATIVE REPORT  Date of Surgery: 04/10/2012  Surgeon: Josephina Gip, MD  Assistant: Nurse  Pre-op Diagnosis: ESRD  Post-op Diagnosis: Same  Procedure: Procedure(s): INSERTION OF DIALYSIS CATHETER-right internal jugular-19 cm Bilateral localization internal jugular veins using ultrasound Anesthesia: General  EBL: Minimal  Complications: None  Procedure Details: The patient was taken to the operating room placed in the supine position at which time the upper chest was exposed in both internal jugular veins are imaged using B. mode ultrasound. Both noted to be widely patent. After prepping and draping in the routine sterile manner right internal jugular vein was entered using a supraclavicular approach guidewire passed into the right atrium under fluoroscopic guidance. After dilating the track appropriately and 19 cm diateck was inserted positioned in the right atrium total peripherally and secured with nylon sutures. The wound was closed with Vicryl in a subcuticular fashion with Dermabond. Patient taken to the recovery room in stable condition.   Josephina Gip, MD 04/10/2012 10:01 AM

## 2012-04-10 NOTE — Preoperative (Signed)
Beta Blockers   Reason not to administer Beta Blockers:Labetelol 5 a.m. today

## 2012-04-10 NOTE — Progress Notes (Signed)
GIVEN CLAMP X1 AND STERILE DRESSINGS... INSTRUCTED IN CARE OF HD CATHETER AT HOME AND IF CATHETER IS DAMAGED .

## 2012-04-10 NOTE — H&P (View-Only) (Signed)
[facility]  HISTORY AND PHYSICAL   History of Present Illness:  Patient is a 78 y.o. year old female who presents for placement of a permanent hemodialysis access. The patient is right handed .  The patient is not currently on hemodialysis.  Other chronic medical problems include anemia, hypertension, hyperparathyroidism. These problems are currently controlled.  She is referred by Dr. Befakadu  Past Medical History  Diagnosis Date  . Anemia 02/11/2012  . Chronic kidney disease   . Hypertension   . Vitamin d deficiency   . Hyperparathyroidism, secondary   . H/O metabolic acidosis     History reviewed. No pertinent past surgical history.   Social History History  Substance Use Topics  . Smoking status: Never Smoker   . Smokeless tobacco: Never Used  . Alcohol Use: No    Family History History reviewed. No pertinent family history.  Allergies  No Known Allergies   Current Outpatient Prescriptions  Medication Sig Dispense Refill  . amLODipine (NORVASC) 10 MG tablet Take 10 mg by mouth daily.      . calcitRIOL (ROCALTROL) 0.5 MCG capsule Take 0.5 mcg by mouth daily.      . ergocalciferol (VITAMIN D2) 50000 UNITS capsule Take 50,000 Units by mouth once a week.      . labetalol (NORMODYNE) 200 MG tablet Take 200 mg by mouth 2 (two) times daily.      . promethazine (PHENERGAN) 25 MG tablet Take 25 mg by mouth every 6 (six) hours as needed.      . torsemide (DEMADEX) 20 MG tablet Take 20 mg by mouth daily. 2 tablets      . ALPRAZolam (XANAX) 0.5 MG tablet Take 0.5 mg by mouth at bedtime as needed.      . amLODipine (NORVASC) 5 MG tablet Take 5 mg by mouth daily.      . calcitRIOL (ROCALTROL) 1 MCG/ML solution Take 0.25 mcg by mouth daily.      . doxazosin (CARDURA) 2 MG tablet Take 2 mg by mouth at bedtime.      . triazolam (HALCION) 0.25 MG tablet Take 0.25 mg by mouth at bedtime as needed.        ROS:   General:  No weight loss, Fever,  chills  HEENT: No recent headaches, no nasal bleeding, no visual changes, no sore throat  Neurologic: No dizziness, blackouts, seizures. No recent symptoms of stroke or mini- stroke. No recent episodes of slurred speech, or temporary blindness.  Cardiac: No recent episodes of chest pain/pressure, no shortness of breath at rest.  No shortness of breath with exertion.  Denies history of atrial fibrillation or irregular heartbeat  Vascular: No history of rest pain in feet.  No history of claudication.  No history of non-healing ulcer, No history of DVT   Pulmonary: No home oxygen, no productive cough, no hemoptysis,  No asthma or wheezing  Musculoskeletal:  [ ] Arthritis, [ ] Low back pain,  [ ] Joint pain  Hematologic:No history of hypercoagulable state.  No history of easy bleeding.  No history of anemia  Gastrointestinal: No hematochezia or melena,  No gastroesophageal reflux, no trouble swallowing  Urinary: [x ] chronic Kidney disease, [ ] on HD - [ ] MWF or [ ] TTHS, [ ] Burning with urination, [ ] Frequent urination, [ ] Difficulty urinating;   Skin: No rashes  Psychological: No history of anxiety,  No history of depression   Physical Examination  Filed Vitals:     03/27/12 1552  BP: 161/77  Pulse: 66  Temp: 98 F (36.7 C)  TempSrc: Oral  Height: 5' 4" (1.626 m)  Weight: 178 lb (80.74 kg)    Body mass index is 30.55 kg/(m^2).  General:  Alert and oriented, no acute distress HEENT: Normal Neck: No bruit or JVD Pulmonary: Clear to auscultation bilaterally Cardiac: Regular Rate and Rhythm without murmur Gastrointestinal: Soft, non-tender, non-distended, no mass Skin: No rash Extremity Pulses:  2+ radial, brachial pulses bilaterally Musculoskeletal: No deformity or edema  Neurologic: Upper and lower extremity motor 5/5 and symmetric  DATA: She had a vein mapping ultrasound today which I reviewed and interpreted. The basilic vein was of reasonable quality between 3 and  5 mm bilaterally. The cephalic vein was fairly small bilaterally. However, on placement of a tourniquet on the left upper arm the cephalic vein was of reasonable quality   ASSESSMENT: Left brachiocephalic AV fistula Tuesday, 04/01/2012. Risks benefits possible complications and procedure details were explained to the patient and her family today. She understands and agrees to proceed. These include but are not limited to bleeding infection ischemic steal non-maturation of the fistula.   PLAN: See above  Elexa Kivi, MD [facility] [place] Office: [phone] Pager: [phone]  

## 2012-04-11 ENCOUNTER — Encounter (HOSPITAL_COMMUNITY): Payer: Self-pay | Admitting: Vascular Surgery

## 2012-05-07 ENCOUNTER — Encounter: Payer: Self-pay | Admitting: Neurosurgery

## 2012-05-08 ENCOUNTER — Encounter: Payer: Self-pay | Admitting: Neurosurgery

## 2012-05-08 ENCOUNTER — Ambulatory Visit (INDEPENDENT_AMBULATORY_CARE_PROVIDER_SITE_OTHER): Payer: Medicare Other | Admitting: Neurosurgery

## 2012-05-08 ENCOUNTER — Ambulatory Visit: Payer: Medicare Other | Admitting: Vascular Surgery

## 2012-05-08 VITALS — BP 142/72 | HR 68 | Resp 16 | Ht 64.0 in | Wt 177.2 lb

## 2012-05-08 DIAGNOSIS — N186 End stage renal disease: Secondary | ICD-10-CM

## 2012-05-08 NOTE — Progress Notes (Signed)
Subjective:     Patient ID: Patricia Ayala, female   DOB: [birth year], 78 y.o.   MRN: [id number]  HPI: 78 year old female patient of Dr. Darrick Penna who underwent a left brachiocephalic AV fistula on 04/01/2012. The patient subsequently had a catheter placed by Dr. Hart Rochester on June 13 4 use until the fistula matures. The patient has no complaints today other than some mild numbness in her forearm.   Review of Systems: 12 point review of systems is notable for the difficulties described above otherwise unremarkable    Objective:   Physical Exam: The patient is afebrile, vital signs are stable, hemodialysis via her catheter. No signs of steal, positive grip in the left hand, she is well perfused.     Assessment:     6 weeks status post left brachiocephalic AV fistula doing well.    Plan:     The patient knows the catheter will mature and be able to be cannulized the first week in September. If she has any difficulty she knows to notify our office. Otherwise she will followup on a when necessary basis her questions were encouraged and answered, she is in agreement with this.  Lauree Chandler ANP next  Clinic M.D.: [place]

## 2012-08-18 ENCOUNTER — Encounter: Payer: Self-pay | Admitting: Vascular Surgery

## 2012-08-20 ENCOUNTER — Ambulatory Visit: Payer: Medicare Other | Admitting: Vascular Surgery

## 2012-09-22 ENCOUNTER — Other Ambulatory Visit: Payer: Self-pay | Admitting: *Deleted

## 2012-09-22 DIAGNOSIS — T82598A Other mechanical complication of other cardiac and vascular devices and implants, initial encounter: Secondary | ICD-10-CM

## 2012-10-14 ENCOUNTER — Encounter: Payer: Self-pay | Admitting: Vascular Surgery

## 2012-10-15 ENCOUNTER — Ambulatory Visit (INDEPENDENT_AMBULATORY_CARE_PROVIDER_SITE_OTHER): Payer: Medicare Other | Admitting: Vascular Surgery

## 2012-10-15 ENCOUNTER — Encounter: Payer: Self-pay | Admitting: Vascular Surgery

## 2012-10-15 ENCOUNTER — Other Ambulatory Visit: Payer: Self-pay

## 2012-10-15 VITALS — BP 149/71 | HR 70 | Ht 64.0 in | Wt 171.6 lb

## 2012-10-15 DIAGNOSIS — T82598A Other mechanical complication of other cardiac and vascular devices and implants, initial encounter: Secondary | ICD-10-CM

## 2012-10-15 DIAGNOSIS — N186 End stage renal disease: Secondary | ICD-10-CM

## 2012-10-15 NOTE — Progress Notes (Signed)
Left brachiocephalic arteriovenous fistula duplex performed @ [facility] 10/15/2012

## 2012-10-15 NOTE — Progress Notes (Signed)
[facility] [place]  Patient name: Patricia Ayala MRN: [id number] DOB: 11/12/1928 Sex: female  REASON FOR VISIT: evaluate left upper arm AV fistula  HPI: Patricia Ayala is a 78 y.o. female who had a left brachiocephalic AV fistula placed on 04/01/2012. She has been on dialysis for approximately 5-6 months. Recently they have had more problems cannulating her fistula and she was sent to have her fistula evaluated. She has had no real pain associated with the fistula or paresthesias in the left arm.   REVIEW OF SYSTEMS: [X ] denotes positive finding; [  ] denotes negative finding  CARDIOVASCULAR:  [ ] chest pain   [ ] dyspnea on exertion    CONSTITUTIONAL:  [ ] fever   [ ] chills  PHYSICAL EXAM: Filed Vitals:   10/15/12 1514  BP: 149/71  Pulse: 70  Height: 5' 4" (1.626 m)  Weight: 171 lb 9.6 oz (77.837 kg)  SpO2: 98%   Body mass index is 29.45 kg/(m^2). GENERAL: The patient is a well-nourished female, in no acute distress. The vital signs are documented above. CARDIOVASCULAR: There is a regular rate and rhythm  PULMONARY: There is good air exchange bilaterally without wheezing or rales. The fistula has a palpable thrill but the thrill is weak in the mid upper arm. Suspect she developed some stenosis here. She has a palpable left radial pulse.  Have independently interpreted her duplex of her fistula which shows an area of stenosis in the proximal fistula.  MEDICAL ISSUES: I have recommended that we proceed with a fistulogram to evaluate the stenosis in the proximal fistula and hopefully this will be something that is amenable to venoplasty. This is been scheduled for 11/03/2012 if she wanted to wait until after Christmas. We have discussed the procedure potential complications and she is agreeable to proceed. Hopefully we can get the fistula working better with a venoplasty.  Lariah Fleer S [facility] [place] Beeper:  [phone]     

## 2012-10-17 ENCOUNTER — Encounter (HOSPITAL_COMMUNITY): Payer: Self-pay | Admitting: Pharmacy Technician

## 2012-11-03 ENCOUNTER — Ambulatory Visit (HOSPITAL_COMMUNITY)
Admission: RE | Admit: 2012-11-03 | Discharge: 2012-11-03 | Disposition: A | Discharge status: Home / Self Care | Payer: Medicare Other | Source: Ambulatory Visit | Attending: Vascular Surgery | Admitting: Vascular Surgery

## 2012-11-03 ENCOUNTER — Encounter (HOSPITAL_COMMUNITY)
Admission: RE | Disposition: A | Discharge status: Home / Self Care | Payer: Self-pay | Source: Ambulatory Visit | Attending: Vascular Surgery

## 2012-11-03 DIAGNOSIS — I871 Compression of vein: Secondary | ICD-10-CM | POA: Insufficient documentation

## 2012-11-03 DIAGNOSIS — Z992 Dependence on renal dialysis: Secondary | ICD-10-CM | POA: Insufficient documentation

## 2012-11-03 DIAGNOSIS — T82898A Other specified complication of vascular prosthetic devices, implants and grafts, initial encounter: Secondary | ICD-10-CM | POA: Insufficient documentation

## 2012-11-03 DIAGNOSIS — Y832 Surgical operation with anastomosis, bypass or graft as the cause of abnormal reaction of the patient, or of later complication, without mention of misadventure at the time of the procedure: Secondary | ICD-10-CM | POA: Insufficient documentation

## 2012-11-03 DIAGNOSIS — N186 End stage renal disease: Secondary | ICD-10-CM

## 2012-11-03 HISTORY — PX: SHUNTOGRAM: SHX5491

## 2012-11-03 LAB — POCT I-STAT, CHEM 8
BUN: 37 mg/dL — ABNORMAL HIGH (ref 6–23)
Chloride: 107 mEq/L (ref 96–112)
Glucose, Bld: 104 mg/dL — ABNORMAL HIGH (ref 70–99)
HCT: 34 % — ABNORMAL LOW (ref 36.0–46.0)
Potassium: 4.2 mEq/L (ref 3.5–5.1)

## 2012-11-03 SURGERY — ASSESSMENT, SHUNT FUNCTION, WITH CONTRAST RADIOGRAPHIC STUDY
Anesthesia: LOCAL

## 2012-11-03 MED ORDER — SODIUM CHLORIDE 0.9 % IV SOLN: INTRAVENOUS | Status: DC

## 2012-11-03 MED ORDER — MIDAZOLAM HCL 2 MG/2ML IJ SOLN
INTRAMUSCULAR | Status: AC
  Filled 2012-11-03: qty 2

## 2012-11-03 MED ORDER — HEPARIN (PORCINE) IN NACL 2-0.9 UNIT/ML-% IJ SOLN
INTRAMUSCULAR | Status: AC
  Filled 2012-11-03: qty 500

## 2012-11-03 MED ORDER — OXYCODONE-ACETAMINOPHEN 5-325 MG PO TABS
1.0000 | ORAL_TABLET | ORAL | Status: DC | PRN
  Administered 2012-11-03: 1 via ORAL

## 2012-11-03 MED ORDER — LIDOCAINE HCL (PF) 1 % IJ SOLN
INTRAMUSCULAR | Status: AC
  Filled 2012-11-03: qty 30

## 2012-11-03 MED ORDER — OXYCODONE-ACETAMINOPHEN 5-325 MG PO TABS
ORAL_TABLET | ORAL | Status: AC
  Filled 2012-11-03: qty 1

## 2012-11-03 NOTE — Interval H&P Note (Signed)
History and Physical Interval Note:  11/03/2012 10:35 AM  [name]  has presented today for surgery, with the diagnosis of End of life  The various methods of treatment have been discussed with the patient and family. After consideration of risks, benefits and other options for treatment, the patient has consented to  Procedure(s) (LRB) with comments: SHUNTOGRAM (N/A) as a surgical intervention .  The patient's history has been reviewed, patient examined, no change in status, stable for surgery.  I have reviewed the patient's chart and labs.  Questions were answered to the patient's satisfaction.     DICKSON,CHRISTOPHER S

## 2012-11-03 NOTE — H&P (View-Only) (Signed)
[facility] [place]  Patient name: Patricia Ayala MRN: [id number] DOB: 1929/03/18 Sex: female  REASON FOR VISIT: evaluate left upper arm AV fistula  HPI: Patricia Ayala is a 79 y.o. female who had a left brachiocephalic AV fistula placed on 40/98/1191. She has been on dialysis for approximately 5-6 months. Recently they have had more problems cannulating her fistula and she was sent to have her fistula evaluated. She has had no real pain associated with the fistula or paresthesias in the left arm.   REVIEW OF SYSTEMS: Arly.Keller ] denotes positive finding; [  ] denotes negative finding  CARDIOVASCULAR:  [ ]  chest pain   [ ]  dyspnea on exertion    CONSTITUTIONAL:  [ ]  fever   [ ]  chills  PHYSICAL EXAM: Filed Vitals:   10/15/12 1514  BP: 149/71  Pulse: 70  Height: 5\' 4"  (1.626 m)  Weight: 171 lb 9.6 oz (77.837 kg)  SpO2: 98%   Body mass index is 29.45 kg/(m^2). GENERAL: The patient is a well-nourished female, in no acute distress. The vital signs are documented above. CARDIOVASCULAR: There is a regular rate and rhythm  PULMONARY: There is good air exchange bilaterally without wheezing or rales. The fistula has a palpable thrill but the thrill is weak in the mid upper arm. Suspect she developed some stenosis here. She has a palpable left radial pulse.  Have independently interpreted her duplex of her fistula which shows an area of stenosis in the proximal fistula.  MEDICAL ISSUES: I have recommended that we proceed with a fistulogram to evaluate the stenosis in the proximal fistula and hopefully this will be something that is amenable to venoplasty. This is been scheduled for 11/03/2012 if she wanted to wait until after Christmas. We have discussed the procedure potential complications and she is agreeable to proceed. Hopefully we can get the fistula working better with a venoplasty.  DICKSON,CHRISTOPHER S [facility] [place] Beeper:  [phone]

## 2012-11-03 NOTE — Op Note (Signed)
PATIENT: Patricia Ayala   MRN: [id number] DOB: 06/04/1929    DATE OF PROCEDURE: 11/03/2012  INDICATIONS: Patricia Ayala is a 79 y.o. female with a poorly functioning left brachiocephalic AV fistula.  PROCEDURE:  1. Ultrasound-guided access to the left brachiocephalic AV fistula. 2. fistulogram left brachial cephalic AV fistula  SURGEON: Di Kindle. Edilia Bo, MD, FACS  ANESTHESIA: local with sedation   EBL: minimal  TECHNIQUE: The patient was brought to the peripheral vascular lab. She received 1 mg of Versed. The left upper extremity was prepped and draped in the usual sterile fashion. After the skin was anesthetized with 1% lidocaine, and under ultrasound guidance, the upper arm fistula was cannulated in the upper arm in a retrograde fashion. A micropuncture sheath was introduced over the wire and secured. Fistula was then obtained. The fistula was examined from the site of cannulation to the central veins. Next the fistula was compressed central to the cannulation site to allow reflux into the proximal fistula and arterial anastomosis.  FINDINGS:  1. The fistula is widely patent with no areas of significant stenosis identified. 2. The arterial anastomosis is widely patent. 3. There is one large competing branch in the proximal fistula  CLINICAL NOTE: the patient will be scheduled for ligation of the large competing branches on a nondialysis day.  Waverly Ferrari, MD, FACS [facility]  DATE OF DICTATION:   11/03/2012

## 2012-11-04 ENCOUNTER — Encounter (HOSPITAL_COMMUNITY): Payer: Self-pay

## 2012-11-04 ENCOUNTER — Other Ambulatory Visit: Payer: Self-pay | Admitting: *Deleted

## 2012-11-06 ENCOUNTER — Encounter (HOSPITAL_COMMUNITY): Payer: Self-pay

## 2012-11-06 MED ORDER — DEXTROSE 5 % IV SOLN
1.5000 g | INTRAVENOUS | Status: AC
  Administered 2012-11-07: 1.5 g via INTRAVENOUS
  Filled 2012-11-06: qty 1.5

## 2012-11-07 ENCOUNTER — Encounter (HOSPITAL_COMMUNITY): Payer: Self-pay | Admitting: Surgery

## 2012-11-07 ENCOUNTER — Encounter (HOSPITAL_COMMUNITY): Payer: Self-pay | Admitting: Certified Registered Nurse Anesthetist

## 2012-11-07 ENCOUNTER — Ambulatory Visit (HOSPITAL_COMMUNITY): Payer: Medicare Other | Admitting: Certified Registered Nurse Anesthetist

## 2012-11-07 ENCOUNTER — Ambulatory Visit (HOSPITAL_COMMUNITY)
Admission: RE | Admit: 2012-11-07 | Discharge: 2012-11-07 | Disposition: A | Discharge status: Home / Self Care | Payer: Medicare Other | Source: Ambulatory Visit | Attending: Vascular Surgery | Admitting: Vascular Surgery

## 2012-11-07 ENCOUNTER — Encounter (HOSPITAL_COMMUNITY)
Admission: RE | Disposition: A | Discharge status: Home / Self Care | Payer: Self-pay | Source: Ambulatory Visit | Attending: Vascular Surgery

## 2012-11-07 DIAGNOSIS — Z992 Dependence on renal dialysis: Secondary | ICD-10-CM | POA: Insufficient documentation

## 2012-11-07 DIAGNOSIS — Y832 Surgical operation with anastomosis, bypass or graft as the cause of abnormal reaction of the patient, or of later complication, without mention of misadventure at the time of the procedure: Secondary | ICD-10-CM | POA: Insufficient documentation

## 2012-11-07 DIAGNOSIS — T82898A Other specified complication of vascular prosthetic devices, implants and grafts, initial encounter: Secondary | ICD-10-CM

## 2012-11-07 DIAGNOSIS — I120 Hypertensive chronic kidney disease with stage 5 chronic kidney disease or end stage renal disease: Secondary | ICD-10-CM | POA: Insufficient documentation

## 2012-11-07 DIAGNOSIS — N186 End stage renal disease: Secondary | ICD-10-CM

## 2012-11-07 HISTORY — PX: LIGATION OF COMPETING BRANCHES OF ARTERIOVENOUS FISTULA: SHX5949

## 2012-11-07 LAB — POCT I-STAT 4, (NA,K, GLUC, HGB,HCT)
Glucose, Bld: 89 mg/dL (ref 70–99)
Potassium: 4.3 mEq/L (ref 3.5–5.1)
Sodium: 141 mEq/L (ref 135–145)

## 2012-11-07 SURGERY — LIGATION OF COMPETING BRANCHES OF ARTERIOVENOUS FISTULA
Anesthesia: General | Site: Arm Upper | Laterality: Left | Wound class: Clean

## 2012-11-07 MED ORDER — OXYCODONE HCL 5 MG/5ML PO SOLN: 5.0000 mg | Freq: Once | ORAL | Status: AC | PRN

## 2012-11-07 MED ORDER — FENTANYL CITRATE 0.05 MG/ML IJ SOLN
INTRAMUSCULAR | Status: AC
  Filled 2012-11-07: qty 2

## 2012-11-07 MED ORDER — SODIUM CHLORIDE 0.9 % IV SOLN
INTRAVENOUS | Status: DC
  Administered 2012-11-07: 13:00:00 via INTRAVENOUS

## 2012-11-07 MED ORDER — PROPOFOL 10 MG/ML IV BOLUS
INTRAVENOUS | Status: DC | PRN
  Administered 2012-11-07: 130 mg via INTRAVENOUS

## 2012-11-07 MED ORDER — 0.9 % SODIUM CHLORIDE (POUR BTL) OPTIME
TOPICAL | Status: DC | PRN
  Administered 2012-11-07: 1000 mL

## 2012-11-07 MED ORDER — SODIUM CHLORIDE 0.9 % IV SOLN
INTRAVENOUS | Status: DC | PRN
  Administered 2012-11-07: 14:00:00 via INTRAVENOUS

## 2012-11-07 MED ORDER — MUPIROCIN 2 % EX OINT
TOPICAL_OINTMENT | Freq: Two times a day (BID) | CUTANEOUS | Status: DC
  Administered 2012-11-07: 1 via NASAL
  Filled 2012-11-07: qty 22

## 2012-11-07 MED ORDER — PROMETHAZINE HCL 25 MG/ML IJ SOLN: 6.2500 mg | INTRAMUSCULAR | Status: DC | PRN

## 2012-11-07 MED ORDER — MEPERIDINE HCL 25 MG/ML IJ SOLN: 6.2500 mg | INTRAMUSCULAR | Status: DC | PRN

## 2012-11-07 MED ORDER — MIDAZOLAM HCL 2 MG/2ML IJ SOLN: 0.5000 mg | Freq: Once | INTRAMUSCULAR | Status: DC | PRN

## 2012-11-07 MED ORDER — FENTANYL CITRATE 0.05 MG/ML IJ SOLN
INTRAMUSCULAR | Status: DC | PRN
  Administered 2012-11-07: 50 ug via INTRAVENOUS

## 2012-11-07 MED ORDER — OXYCODONE HCL 5 MG PO TABS
5.0000 mg | ORAL_TABLET | Freq: Once | ORAL | Status: AC | PRN
  Administered 2012-11-07: 5 mg via ORAL

## 2012-11-07 MED ORDER — MUPIROCIN 2 % EX OINT
TOPICAL_OINTMENT | CUTANEOUS | Status: AC
  Administered 2012-11-07: 1 via NASAL
  Filled 2012-11-07: qty 22

## 2012-11-07 MED ORDER — OXYCODONE HCL 5 MG PO TABS: 5.0000 mg | ORAL_TABLET | Freq: Four times a day (QID) | ORAL | Status: DC | PRN

## 2012-11-07 MED ORDER — LIDOCAINE HCL (CARDIAC) 10 MG/ML IV SOLN
INTRAVENOUS | Status: DC | PRN
  Administered 2012-11-07: 20 mg via INTRAVENOUS

## 2012-11-07 MED ORDER — FENTANYL CITRATE 0.05 MG/ML IJ SOLN
25.0000 ug | INTRAMUSCULAR | Status: DC | PRN
  Administered 2012-11-07 (×2): 50 ug via INTRAVENOUS

## 2012-11-07 MED ORDER — OXYCODONE HCL 5 MG PO TABS
ORAL_TABLET | ORAL | Status: AC
  Filled 2012-11-07: qty 1

## 2012-11-07 MED ORDER — MIDAZOLAM HCL 5 MG/5ML IJ SOLN
INTRAMUSCULAR | Status: DC | PRN
  Administered 2012-11-07: 2 mg via INTRAVENOUS

## 2012-11-07 SURGICAL SUPPLY — 36 items
CANISTER SUCTION 2500CC (MISCELLANEOUS) ×2 IMPLANT
CLOTH BEACON ORANGE TIMEOUT ST (SAFETY) ×2 IMPLANT
COVER SURGICAL LIGHT HANDLE (MISCELLANEOUS) ×2 IMPLANT
DERMABOND ADVANCED (GAUZE/BANDAGES/DRESSINGS) ×1
DERMABOND ADVANCED .7 DNX12 (GAUZE/BANDAGES/DRESSINGS) ×1 IMPLANT
ELECT REM PT RETURN 9FT ADLT (ELECTROSURGICAL) ×2
ELECTRODE REM PT RTRN 9FT ADLT (ELECTROSURGICAL) ×1 IMPLANT
GEL ULTRASOUND 20GR AQUASONIC (MISCELLANEOUS) IMPLANT
GLOVE BIO SURGEON STRL SZ7.5 (GLOVE) ×2 IMPLANT
GLOVE BIOGEL PI IND STRL 6.5 (GLOVE) ×1 IMPLANT
GLOVE BIOGEL PI IND STRL 7.0 (GLOVE) ×1 IMPLANT
GLOVE BIOGEL PI IND STRL 8 (GLOVE) ×1 IMPLANT
GLOVE BIOGEL PI INDICATOR 6.5 (GLOVE) ×1
GLOVE BIOGEL PI INDICATOR 7.0 (GLOVE) ×1
GLOVE BIOGEL PI INDICATOR 8 (GLOVE) ×1
GLOVE ECLIPSE 6.5 STRL STRAW (GLOVE) ×2 IMPLANT
GOWN STRL NON-REIN LRG LVL3 (GOWN DISPOSABLE) ×4 IMPLANT
KIT BASIN OR (CUSTOM PROCEDURE TRAY) ×2 IMPLANT
KIT ROOM TURNOVER OR (KITS) ×2 IMPLANT
NS IRRIG 1000ML POUR BTL (IV SOLUTION) ×2 IMPLANT
PACK CV ACCESS (CUSTOM PROCEDURE TRAY) ×2 IMPLANT
PAD ARMBOARD 7.5X6 YLW CONV (MISCELLANEOUS) ×4 IMPLANT
SPONGE GAUZE 4X4 12PLY (GAUZE/BANDAGES/DRESSINGS) ×2 IMPLANT
SPONGE SURGIFOAM ABS GEL 100 (HEMOSTASIS) IMPLANT
SUT ETHILON 3 0 PS 1 (SUTURE) IMPLANT
SUT PROLENE 6 0 BV (SUTURE) IMPLANT
SUT SILK 0 TIES 10X30 (SUTURE) ×2 IMPLANT
SUT VIC AB 3-0 SH 27 (SUTURE) ×1
SUT VIC AB 3-0 SH 27X BRD (SUTURE) ×1 IMPLANT
SUT VICRYL 4-0 PS2 18IN ABS (SUTURE) ×2 IMPLANT
SWAB COLLECTION DEVICE MRSA (MISCELLANEOUS) IMPLANT
TOWEL OR 17X24 6PK STRL BLUE (TOWEL DISPOSABLE) ×2 IMPLANT
TOWEL OR 17X26 10 PK STRL BLUE (TOWEL DISPOSABLE) ×2 IMPLANT
TUBE ANAEROBIC SPECIMEN COL (MISCELLANEOUS) IMPLANT
UNDERPAD 30X30 INCONTINENT (UNDERPADS AND DIAPERS) ×2 IMPLANT
WATER STERILE IRR 1000ML POUR (IV SOLUTION) ×2 IMPLANT

## 2012-11-07 NOTE — Anesthesia Postprocedure Evaluation (Signed)
  Anesthesia Post-op Note  Patient: Patricia Ayala  Procedure(s) Performed: Procedure(s) (LRB) with comments: LIGATION OF COMPETING BRANCHES OF ARTERIOVENOUS FISTULA (Left) - Brachio/Cephalic Fistula  Patient Location: PACU  Anesthesia Type:General  Level of Consciousness: awake, alert , oriented and patient cooperative  Airway and Oxygen Therapy: Patient Spontanous Breathing  Post-op Pain: none  Post-op Assessment: Post-op Vital signs reviewed, Patient's Cardiovascular Status Stable, Respiratory Function Stable, Patent Airway, No signs of Nausea or vomiting and Pain level controlled  Post-op Vital Signs: Reviewed and stable  Complications: No apparent anesthesia complications

## 2012-11-07 NOTE — H&P (View-Only) (Signed)
[facility] of [place]  Patient name: Patricia Ayala MRN: [id number] DOB: 10/11/1929 Sex: female  REASON FOR VISIT: evaluate left upper arm AV fistula  HPI: Patricia Ayala is a 79 y.o. female who had a left brachiocephalic AV fistula placed on 04/01/2012. She has been on dialysis for approximately 5-6 months. Recently they have had more problems cannulating her fistula and she was sent to have her fistula evaluated. She has had no real pain associated with the fistula or paresthesias in the left arm.   REVIEW OF SYSTEMS: [X ] denotes positive finding; [  ] denotes negative finding  CARDIOVASCULAR:  [ ] chest pain   [ ] dyspnea on exertion    CONSTITUTIONAL:  [ ] fever   [ ] chills  PHYSICAL EXAM: Filed Vitals:   10/15/12 1514  BP: 149/71  Pulse: 70  Height: 5' 4" (1.626 m)  Weight: 171 lb 9.6 oz (77.837 kg)  SpO2: 98%   Body mass index is 29.45 kg/(m^2). GENERAL: The patient is a well-nourished female, in no acute distress. The vital signs are documented above. CARDIOVASCULAR: There is a regular rate and rhythm  PULMONARY: There is good air exchange bilaterally without wheezing or rales. The fistula has a palpable thrill but the thrill is weak in the mid upper arm. Suspect she developed some stenosis here. She has a palpable left radial pulse.  Have independently interpreted her duplex of her fistula which shows an area of stenosis in the proximal fistula.  MEDICAL ISSUES: I have recommended that we proceed with a fistulogram to evaluate the stenosis in the proximal fistula and hopefully this will be something that is amenable to venoplasty. This is been scheduled for 11/03/2012 if she wanted to wait until after Christmas. We have discussed the procedure potential complications and she is agreeable to proceed. Hopefully we can get the fistula working better with a venoplasty.  DICKSON,CHRISTOPHER S [facility] [place] Beeper:  [phone]     

## 2012-11-07 NOTE — Preoperative (Signed)
Beta Blockers   Reason not to administer Beta Blockers:Not Applicable 

## 2012-11-07 NOTE — Anesthesia Preprocedure Evaluation (Signed)
Anesthesia Evaluation  Patient identified by MRN, date of birth, ID band Patient awake    Reviewed: Allergy & Precautions, H&P , NPO status , Patient's Chart, lab work & pertinent test results  History of Anesthesia Complications Negative for: history of anesthetic complications  Airway Mallampati: I TM Distance: >3 FB Neck ROM: Full    Dental  (+) Missing, Dental Advisory Given and Poor Dentition   Pulmonary neg pulmonary ROS,  breath sounds clear to auscultation  Pulmonary exam normal       Cardiovascular hypertension, Pt. on medications and Pt. on home beta blockers Rhythm:Regular Rate:Normal     Neuro/Psych negative neurological ROS     GI/Hepatic negative GI ROS, Neg liver ROS,   Endo/Other  negative endocrine ROS  Renal/GU ESRF and DialysisRenal disease (dialsis TuThSa, K+  4.3)     Musculoskeletal   Abdominal   Peds  Hematology   Anesthesia Other Findings   Reproductive/Obstetrics                           Anesthesia Physical Anesthesia Plan  ASA: III  Anesthesia Plan: General   Post-op Pain Management:    Induction: Intravenous  Airway Management Planned: LMA  Additional Equipment:   Intra-op Plan:   Post-operative Plan:   Informed Consent: I have reviewed the patients History and Physical, chart, labs and discussed the procedure including the risks, benefits and alternatives for the proposed anesthesia with the patient or authorized representative who has indicated his/her understanding and acceptance.   Dental advisory given  Plan Discussed with: CRNA and Surgeon  Anesthesia Plan Comments: (Plan routine monitors, GA- LMA OK)        Anesthesia Quick Evaluation

## 2012-11-07 NOTE — Op Note (Signed)
NAME: Patricia Ayala   MRN: [id number] DOB: [birth year]    DATE OF OPERATION: 11/07/2012  PREOP DIAGNOSIS: poorly functioning AV fistula  POSTOP DIAGNOSIS: poorly functioning left brachiocephalic AV fistula  PROCEDURE: ligation of competing branch of left brachiocephalic AV fistula  SURGEON: Di Kindle. Edilia Bo, MD, FACS  ASSIST: none  ANESTHESIA: Gen.   EBL: minimal  INDICATIONS: Patricia Ayala is a 79 y.o. female with a poorly functioning left brachiocephalic AV fistula. She underwent a fistulogram and the only potentially correctable problem was a large competing branch. She is brought in for ligation.  FINDINGS: 1 large competing branch which was ligated.  TECHNIQUE: The patient was taken to room and received a general anesthetic. The left upper extremity was prepped and draped in usual sterile fashion. A small incision was made at the site where the large branch had been identified. The branch was dissected free and ligated with 2 2-0 silk ties. Incision was closed with a 4-0 subcuticular stitch. Dermabond was applied. The patient tolerated the procedure well and was transferred to the recovery room in stable condition. All needle and sponge counts were correct.  Waverly Ferrari, MD, FACS [facility]  DATE OF DICTATION:   11/07/2012

## 2012-11-07 NOTE — Transfer of Care (Signed)
Immediate Anesthesia Transfer of Care Note  Patient: Patricia Ayala  Procedure(s) Performed: Procedure(s) (LRB) with comments: LIGATION OF COMPETING BRANCHES OF ARTERIOVENOUS FISTULA (Left) - Brachio/Cephalic Fistula  Patient Location: PACU  Anesthesia Type:General  Level of Consciousness: awake, alert  and oriented  Airway & Oxygen Therapy: Patient Spontanous Breathing and Patient connected to nasal cannula oxygen  Post-op Assessment: Report given to PACU RN, Post -op Vital signs reviewed and stable and Patient moving all extremities X 4  Post vital signs: Reviewed and stable  Complications: No apparent anesthesia complications

## 2012-11-07 NOTE — Interval H&P Note (Signed)
History and Physical Interval Note:  11/07/2012 1:35 PM  Patricia Ayala  has presented today for surgery, with the diagnosis of ESRD;COMP BRANCH  The various methods of treatment have been discussed with the patient and family. After consideration of risks, benefits and other options for treatment, the patient has consented to  Procedure(s) (LRB) with comments: LIGATION OF COMPETING BRANCHES OF ARTERIOVENOUS FISTULA (Left) - B-C as a surgical intervention .  The patient's history has been reviewed, patient examined, no change in status, stable for surgery.  I have reviewed the patient's chart and labs.  Questions were answered to the patient's satisfaction.     Xavier Munger S

## 2012-11-10 ENCOUNTER — Encounter (HOSPITAL_COMMUNITY): Payer: Self-pay | Admitting: Vascular Surgery

## 2012-11-10 ENCOUNTER — Telehealth: Payer: Self-pay | Admitting: Vascular Surgery

## 2012-11-10 NOTE — Telephone Encounter (Addendum)
Message copied by Rosalyn Charters on Mon Nov 10, 2012 11:28 AM ------      Message from: Melene Plan      Created: Mon Nov 10, 2012  9:28 AM      Regarding: FW: charge                   ----- Message -----         From: Chuck Hint, MD         Sent: 11/07/2012   5:28 PM           To: Reuel Derby, Melene Plan, RN      Subject: charge                                                   PROCEDURE: ligation of competing branch of left brachiocephalic AV fistula            SURGEON: Di Kindle. Edilia Bo, MD, FACS            ASSIST: none            She will need a follow up visit in 3 weeks. Thank you. CSD  nofified pt. of 3 wk fu appt. with csd as per staff message on 11-10-12 no answer mailed appt letter

## 2012-12-02 ENCOUNTER — Encounter: Payer: Self-pay | Admitting: Vascular Surgery

## 2012-12-03 ENCOUNTER — Ambulatory Visit (INDEPENDENT_AMBULATORY_CARE_PROVIDER_SITE_OTHER): Payer: Medicare Other | Admitting: Vascular Surgery

## 2012-12-03 ENCOUNTER — Encounter: Payer: Self-pay | Admitting: Vascular Surgery

## 2012-12-03 VITALS — BP 148/69 | HR 66 | Ht 64.0 in | Wt 174.0 lb

## 2012-12-03 DIAGNOSIS — N186 End stage renal disease: Secondary | ICD-10-CM

## 2012-12-03 NOTE — Assessment & Plan Note (Signed)
Her left brachiocephalic AV fistula appears to be working better since she had this large competing branches ligated. Hopefully this will continue to provide adequate access. We will see her back as needed.

## 2012-12-03 NOTE — Progress Notes (Signed)
[facility] [place]  Patient name: MONIGUE Ayala MRN: [id number] DOB: 1929-03-13 Sex: female  REASON FOR VISIT: follow up after ligation of competing branches of left brachiocephalic AV fistula  HPI: Patricia Ayala is a 79 y.o. female who had a left brachiocephalic AV fistula placed on 40/98/1191. She underwent a fistulogram on 11/03/2012 which showed that the fistula was widely patent with no areas of significant stenosis noted. Likewise the anastomosis was widely patent. There was one large competing branches however in the proximal fistula. She underwent ligation of competing branches in the left brachiocephalic AV fistula on 11/07/2012. She comes in for a routine follow up visit. She states that they have been using her fistula for dialysis and it has been working better sense her revision. She's had no pain or paresthesias in the left arm.  REVIEW OF SYSTEMS: Arly.Keller ] denotes positive finding; [  ] denotes negative finding  CARDIOVASCULAR:  [ ]  chest pain   [ ]  dyspnea on exertion    CONSTITUTIONAL:  [ ]  fever   [ ]  chills  PHYSICAL EXAM: Filed Vitals:   12/03/12 1342  BP: 148/69  Pulse: 66  Height: 5\' 4"  (1.626 m)  Weight: 174 lb (78.926 kg)  SpO2: 100%   Body mass index is 29.87 kg/(m^2). GENERAL: The patient is a well-nourished female, in no acute distress. The vital signs are documented above. CARDIOVASCULAR: There is a regular rate and rhythm  PULMONARY: There is good air exchange bilaterally without wheezing or rales. Her left upper arm AV fistula has an excellent bruit and thrill. The fistula is not pulsatile.  MEDICAL ISSUES:  End stage renal disease Her left brachiocephalic AV fistula appears to be working better since she had this large competing branches ligated. Hopefully this will continue to provide adequate access. We will see her back as needed.   DICKSON,CHRISTOPHER S [facility] [place] Beeper: [phone]

## 2013-02-27 ENCOUNTER — Other Ambulatory Visit: Payer: Self-pay | Admitting: *Deleted

## 2013-02-27 DIAGNOSIS — T82598A Other mechanical complication of other cardiac and vascular devices and implants, initial encounter: Secondary | ICD-10-CM

## 2013-03-03 ENCOUNTER — Encounter: Payer: Self-pay | Admitting: Vascular Surgery

## 2013-03-04 ENCOUNTER — Encounter (INDEPENDENT_AMBULATORY_CARE_PROVIDER_SITE_OTHER): Payer: Medicare Other | Admitting: *Deleted

## 2013-03-04 ENCOUNTER — Ambulatory Visit (INDEPENDENT_AMBULATORY_CARE_PROVIDER_SITE_OTHER): Payer: Medicare Other | Admitting: Vascular Surgery

## 2013-03-04 ENCOUNTER — Other Ambulatory Visit: Payer: Self-pay

## 2013-03-04 ENCOUNTER — Encounter: Payer: Self-pay | Admitting: Vascular Surgery

## 2013-03-04 VITALS — BP 185/75 | HR 67 | Resp 18 | Ht 64.0 in | Wt 175.0 lb

## 2013-03-04 DIAGNOSIS — N186 End stage renal disease: Secondary | ICD-10-CM

## 2013-03-04 DIAGNOSIS — T82598A Other mechanical complication of other cardiac and vascular devices and implants, initial encounter: Secondary | ICD-10-CM

## 2013-03-04 NOTE — Progress Notes (Signed)
[facility] [place]  Established Dialysis Access  History of Present Illness  Patricia Ayala is a 79 y.o. (1929/04/19) female who presents for re-evaluation of L BC AVF.  Per patient, they are having poor flow rates with the access.  She denies any steal sx.  Her last procedure was in 11/03/12: sidebranch ligation.   The patient notes they have been able to successfully complete hemodialysis runs with the fistula previously.  Past Medical History  Diagnosis Date  . Anemia 02/11/2012  . Vitamin D deficiency   . Hyperparathyroidism, secondary   . H/O metabolic acidosis   . Anxiety   . Hypertension     Dr. Regino Schultze, [place], [place]  . Chronic kidney disease     not on dialysis yet, Tues, thurs, sat    Past Surgical History  Procedure Laterality Date  . Abdominal hysterectomy    . Cholecystectomy    . Eye surgery      bilateral cataracts, /w IOL - 2013  . Av fistula placement  04/01/2012    Procedure: ARTERIOVENOUS (AV) FISTULA CREATION;  Surgeon: Sherren Kerns, MD;  Location: [facility] OR;  Service: Vascular;  Laterality: Left;  . Colon surgery      for a blockage  . Insertion of dialysis catheter  04/10/2012    Procedure: INSERTION OF DIALYSIS CATHETER;  Surgeon: Pryor Ochoa, MD;  Location: [facility] OR;  Service: Vascular;  Laterality: N/A;  Insertion Diatek Catheter Right Internal Jugular  . Ligation of competing branches of arteriovenous fistula  11/07/2012    Procedure: LIGATION OF COMPETING BRANCHES OF ARTERIOVENOUS FISTULA;  Surgeon: Chuck Hint, MD;  Location: [facility] OR;  Service: Vascular;  Laterality: Left;  Brachio/Cephalic Fistula    History   Social History  . Marital Status: Married    Spouse Name: N/A    Number of Children: N/A  . Years of Education: N/A   Occupational History  . Not on file.   Social History Main Topics  . Smoking status: Never Smoker   . Smokeless tobacco: Never Used  . Alcohol Use: No  . Drug Use: No  . Sexually Active:  Not on file   Other Topics Concern  . Not on file   Social History Narrative  . No narrative on file    Family History  Problem Relation Age of Onset  . Stroke Mother   . Cancer Father   . Heart attack Father     Current Outpatient Prescriptions on File Prior to Visit  Medication Sig Dispense Refill  . ALPRAZolam (XANAX) 0.5 MG tablet Take 1 tablet by mouth every 6 (six) hours as needed. For anxiety      . amLODipine (NORVASC) 10 MG tablet Take 10 mg by mouth daily.      Marland Kitchen BIOTIN PO Take 1 tablet by mouth daily.      . calcitRIOL (ROCALTROL) 0.5 MCG capsule Take 0.5 mcg by mouth daily.      Marland Kitchen labetalol (NORMODYNE) 200 MG tablet Take 400 mg by mouth 2 (two) times daily.       Marland Kitchen oxyCODONE (ROXICODONE) 5 MG immediate release tablet Take 1 tablet (5 mg total) by mouth every 6 (six) hours as needed for pain.  20 tablet  0  . promethazine (PHENERGAN) 25 MG tablet Take 25 mg by mouth every 6 (six) hours as needed. For nausea/vomiting      . sevelamer (RENVELA) 800 MG tablet Take 800 mg by mouth 3 (three) times  daily with meals.      . torsemide (DEMADEX) 20 MG tablet Take 20 mg by mouth daily.       No current facility-administered medications on file prior to visit.    No Known Allergies  Review of Systems (Positive items checked otherwise negative)  General: [ ]  Weight loss, [ ]  Weight gain, [ ]   Loss of appetite, [ ]  Fever  Neurologic: [ ]  Dizziness, [ ]  Blackouts, [ ]  Headaches, [ ]  Seizure  Ear/Nose/Throat: [ ]  Change in eyesight, [ ]  Change in hearing, [ ]  Nose bleeds, [ ]  Sore throat  Vascular: [ ]  Pain in legs with walking, [ ]  Pain in feet while lying flat, [ ]  Non-healing ulcer, Stroke, [ ]  "Mini stroke", [ ]  Slurred speech, [ ]  Temporary blindness, [ ]  Blood clot in vein, [ ]  Phlebitis  Pulmonary: [ ]  Home oxygen, [ ]  Productive cough, [ ]  Bronchitis, [ ]  Coughing up blood, [ ]  Asthma, [ ]  Wheezing  Musculoskeletal: [ ]  Arthritis, [ ]  Joint pain, [ ]  Muscle  pain  Cardiac: [ ]  Chest pain, [ ]  Chest tightness/pressure, [ ]  Shortness of breath when lying flat, [ ]  Shortness of breath with exertion, [ ]  Palpitations, [ ]  Heart murmur, [ ]  Arrythmia,  [ ]  Atrial fibrillation  Hematologic: [ ]  Bleeding problems, [ ]  Clotting disorder, [ ]  Anemia  Psychiatric:  [ ]  Depression, [ ]  Anxiety, [ ]  Attention deficit disorder  Gastrointestinal:  [ ]  Black stool,[ ]   Blood in stool, [ ]  Peptic ulcer disease, [ ]  Reflux,[ ]  Hiatal hernia, [ ]  Trouble swallowing, [ ]  Diarrhea, [ ]  Constipation  Urinary:  [x]  Kidney disease, [ ]  Burning with urination, [ ]  Frequent urination, [ ]  Difficulty urinating  Skin: [ ]  Ulcers, [ ]  Rashes    Physical Examination  Filed Vitals:   03/04/13 1533  BP: 185/75  Pulse: 67  Resp: 18  Height: 5\' 4"  (1.626 m)  Weight: 175 lb (79.379 kg)   Body mass index is 30.02 kg/(m^2).  General: A&O x 3, WD, obese  Pulmonary: Sym exp, good air movt, CTAB, no rales, rhonchi, & wheezing  Cardiac: RRR, Nl S1, S2, no Murmurs, rubs or gallops  Gastrointestinal: soft, NTND, -G/R, - HSM, - masses, - CVAT B  Musculoskeletal: M/S 5/5 throughout , Extremities without  ischemic changes , palpable thrill in access in L BC AVF, + bruit in access  Neurologic: Pain and light touch intact in extremities , Motor exam as listed above  Non-Invasive Vascular Imaging  L access duplex (Date: 03/04/13):   Patent L BC AVF  Anastomosis: 312 c/s  Distal vein: 901 c/s  Diameter: 2.6-8.4 c/s  Medical Decision Making  Patricia Ayala is a 79 y.o. female who presents with poorly functioning L BC AVF, ESRD requiring hemodialysis.   Based on the access duplex, a concern with distal fistula stenosis is raised, which may be compromising inflow to the fistula.    I recommend L arm fistulogram, possible intervention to help determine if this is so.  I discussed with the patient the nature of angiographic procedures, especially the limited  patencies of any endovascular intervention.  The patient is aware of that the risks of an angiographic procedure include but are not limited to: bleeding, infection, access site complications, renal failure, embolization, rupture of vessel, dissection, possible need for emergent surgical intervention, possible need for surgical procedures to treat the patient's pathology, and stroke and death.  The  patient is aware of the risks and agrees to proceed.  The patient has agreed to proceed with the above procedure which will be scheduled 16 MAY 14.  Leonides Sake, MD [facility] [place] Office: [phone] Pager: [phone]  03/04/2013, 4:29 PM

## 2013-03-06 ENCOUNTER — Encounter (HOSPITAL_COMMUNITY): Payer: Self-pay | Admitting: Pharmacy Technician

## 2013-03-09 ENCOUNTER — Ambulatory Visit (HOSPITAL_COMMUNITY)
Admission: RE | Admit: 2013-03-09 | Discharge: 2013-03-09 | Disposition: A | Discharge status: Home / Self Care | Payer: Medicare Other | Source: Ambulatory Visit | Attending: Vascular Surgery | Admitting: Vascular Surgery

## 2013-03-09 ENCOUNTER — Encounter (HOSPITAL_COMMUNITY)
Admission: RE | Disposition: A | Discharge status: Home / Self Care | Payer: Self-pay | Source: Ambulatory Visit | Attending: Vascular Surgery

## 2013-03-09 DIAGNOSIS — Y832 Surgical operation with anastomosis, bypass or graft as the cause of abnormal reaction of the patient, or of later complication, without mention of misadventure at the time of the procedure: Secondary | ICD-10-CM | POA: Insufficient documentation

## 2013-03-09 DIAGNOSIS — T82898A Other specified complication of vascular prosthetic devices, implants and grafts, initial encounter: Secondary | ICD-10-CM

## 2013-03-09 DIAGNOSIS — N2581 Secondary hyperparathyroidism of renal origin: Secondary | ICD-10-CM | POA: Insufficient documentation

## 2013-03-09 DIAGNOSIS — I871 Compression of vein: Secondary | ICD-10-CM | POA: Insufficient documentation

## 2013-03-09 DIAGNOSIS — E559 Vitamin D deficiency, unspecified: Secondary | ICD-10-CM | POA: Insufficient documentation

## 2013-03-09 DIAGNOSIS — F411 Generalized anxiety disorder: Secondary | ICD-10-CM | POA: Insufficient documentation

## 2013-03-09 DIAGNOSIS — N186 End stage renal disease: Secondary | ICD-10-CM | POA: Insufficient documentation

## 2013-03-09 DIAGNOSIS — D649 Anemia, unspecified: Secondary | ICD-10-CM | POA: Insufficient documentation

## 2013-03-09 DIAGNOSIS — Z992 Dependence on renal dialysis: Secondary | ICD-10-CM | POA: Insufficient documentation

## 2013-03-09 DIAGNOSIS — I120 Hypertensive chronic kidney disease with stage 5 chronic kidney disease or end stage renal disease: Secondary | ICD-10-CM | POA: Insufficient documentation

## 2013-03-09 DIAGNOSIS — Z79899 Other long term (current) drug therapy: Secondary | ICD-10-CM | POA: Insufficient documentation

## 2013-03-09 HISTORY — PX: SHUNTOGRAM: SHX5491

## 2013-03-09 LAB — POCT I-STAT, CHEM 8
Calcium, Ion: 1.18 mmol/L (ref 1.13–1.30)
Chloride: 108 mEq/L (ref 96–112)
Glucose, Bld: 93 mg/dL (ref 70–99)
HCT: 37 % (ref 36.0–46.0)
Hemoglobin: 12.6 g/dL (ref 12.0–15.0)
TCO2: 24 mmol/L (ref 0–100)

## 2013-03-09 SURGERY — ASSESSMENT, SHUNT FUNCTION, WITH CONTRAST RADIOGRAPHIC STUDY
Anesthesia: LOCAL | Laterality: Left

## 2013-03-09 MED ORDER — HEPARIN (PORCINE) IN NACL 2-0.9 UNIT/ML-% IJ SOLN
INTRAMUSCULAR | Status: AC
  Filled 2013-03-09: qty 500

## 2013-03-09 MED ORDER — ACETAMINOPHEN 325 MG PO TABS
650.0000 mg | ORAL_TABLET | ORAL | Status: DC | PRN
  Administered 2013-03-09: 650 mg via ORAL

## 2013-03-09 MED ORDER — ONDANSETRON HCL 4 MG/2ML IJ SOLN: 4.0000 mg | Freq: Four times a day (QID) | INTRAMUSCULAR | Status: DC | PRN

## 2013-03-09 MED ORDER — ACETAMINOPHEN 325 MG PO TABS
ORAL_TABLET | ORAL | Status: AC
  Filled 2013-03-09: qty 2

## 2013-03-09 MED ORDER — SODIUM CHLORIDE 0.9 % IJ SOLN: 3.0000 mL | INTRAMUSCULAR | Status: DC | PRN

## 2013-03-09 MED ORDER — ACETAMINOPHEN 325 MG PO TABS: 650.0000 mg | ORAL_TABLET | ORAL | Status: DC | PRN

## 2013-03-09 MED ORDER — LIDOCAINE HCL (PF) 1 % IJ SOLN
INTRAMUSCULAR | Status: AC
  Filled 2013-03-09: qty 30

## 2013-03-09 MED ORDER — SODIUM CHLORIDE 0.9 % IJ SOLN: 3.0000 mL | Freq: Two times a day (BID) | INTRAMUSCULAR | Status: DC

## 2013-03-09 MED ORDER — SODIUM CHLORIDE 0.9 % IV SOLN: 250.0000 mL | INTRAVENOUS | Status: DC | PRN

## 2013-03-09 NOTE — Op Note (Signed)
OPERATIVE NOTE   PROCEDURE: 1. Left brachiocephalic arteriovenous fistula cannulation under ultrasound guidance 2. Left arm fistulogram  PRE-OPERATIVE DIAGNOSIS: Malfunctioning left brachiocephalic arteriovenous fistula  POST-OPERATIVE DIAGNOSIS: same as above   SURGEON: Leonides Sake, MD  ANESTHESIA: local  ESTIMATED BLOOD LOSS: 5 cc  FINDING(S): 1. Narrowed distal 2 cm of cephalic vein (2.0-3.0 mm) 2. Widely patent brachiocephalic arteriovenous fistula and central venous structures  SPECIMEN(S):  None  CONTRAST: 30 cc  INDICATIONS: Patricia Ayala is a 79 y.o. female who  presents with malfunctioning left brachiocephalic arteriovenous fistula with elevated velocities distally in the fistula.  The patient does note since her last office visit that she has undergone successful cannulation and hemodialysis runs.  The patient is scheduled for left arm fistulogram.  The patient is aware the risks include but are not limited to: bleeding, infection, thrombosis of the cannulated access, and possible anaphylactic reaction to the contrast.  The patient is aware of the risks of the procedure and elects to proceed forward.  DESCRIPTION: After full informed written consent was obtained, the patient was brought back to the angiography suite and placed supine upon the angiography table.  The patient was connected to monitoring equipment.  The left arm was prepped and draped in the standard fashion for a left arm fistulogram.  Under ultrasound guidance, the left brachiocephalic arteriovenous fistula was cannulated with a micropuncture needle.  The microwire was advanced into the fistula and the needle was exchanged for the a microsheath, which was lodged 2 cm into the access.  The wire was removed and the sheath was connected to the IV extension tubing.  Hand injections were completed to image the access from the antecubitum up to the level of axilla.  The central venous structures were also imaged by  hand injections.  Based on the images, this patient will need: revision of the brachiocephalic arteriovenous fistula's anastomosis if she continues having flow-rate problems.  A 4-0 Monocryl purse-string suture was sewn around the sheath.  The sheath was removed while tying down the suture.  A sterile bandage was applied to the puncture site.  COMPLICATIONS: none  CONDITION: stable  Leonides Sake, MD [facility] [place] Office: [phone] Pager: [phone]  03/09/2013 12:55 PM

## 2013-03-09 NOTE — Interval H&P Note (Signed)
[facility]   History and Physical Update  The patient was interviewed and re-examined.  The patient's previous History and Physical has been reviewed and is unchanged.  There is no change in the plan of care: L arm fistulogram, with possible intervention.  Leonides Sake, MD [facility] [place] Office: [phone] Pager: [phone]  03/09/2013, 9:37 AM

## 2013-03-09 NOTE — H&P (View-Only) (Signed)
[facility] [place]  Established Dialysis Access  History of Present Illness  Patricia Ayala is a 79 y.o. (06/19/1929) female who presents for re-evaluation of L BC AVF.  Per patient, they are having poor flow rates with the access.  She denies any steal sx.  Her last procedure was in 11/03/12: sidebranch ligation.   The patient notes they have been able to successfully complete hemodialysis runs with the fistula previously.  Past Medical History  Diagnosis Date  . Anemia 02/11/2012  . Vitamin D deficiency   . Hyperparathyroidism, secondary   . H/O metabolic acidosis   . Anxiety   . Hypertension     Dr. McGough, [place], [place]  . Chronic kidney disease     not on dialysis yet, Tues, thurs, sat    Past Surgical History  Procedure Laterality Date  . Abdominal hysterectomy    . Cholecystectomy    . Eye surgery      bilateral cataracts, /w IOL - 2013  . Av fistula placement  04/01/2012    Procedure: ARTERIOVENOUS (AV) FISTULA CREATION;  Surgeon: Charles E Fields, MD;  Location: [facility] OR;  Service: Vascular;  Laterality: Left;  . Colon surgery      for a blockage  . Insertion of dialysis catheter  04/10/2012    Procedure: INSERTION OF DIALYSIS CATHETER;  Surgeon: James D Lawson, MD;  Location: [facility] OR;  Service: Vascular;  Laterality: N/A;  Insertion Diatek Catheter Right Internal Jugular  . Ligation of competing branches of arteriovenous fistula  11/07/2012    Procedure: LIGATION OF COMPETING BRANCHES OF ARTERIOVENOUS FISTULA;  Surgeon: Christopher S Dickson, MD;  Location: [facility] OR;  Service: Vascular;  Laterality: Left;  Brachio/Cephalic Fistula    History   Social History  . Marital Status: Married    Spouse Name: N/A    Number of Children: N/A  . Years of Education: N/A   Occupational History  . Not on file.   Social History Main Topics  . Smoking status: Never Smoker   . Smokeless tobacco: Never Used  . Alcohol Use: No  . Drug Use: No  . Sexually Active:  Not on file   Other Topics Concern  . Not on file   Social History Narrative  . No narrative on file    Family History  Problem Relation Age of Onset  . Stroke Mother   . Cancer Father   . Heart attack Father     Current Outpatient Prescriptions on File Prior to Visit  Medication Sig Dispense Refill  . ALPRAZolam (XANAX) 0.5 MG tablet Take 1 tablet by mouth every 6 (six) hours as needed. For anxiety      . amLODipine (NORVASC) 10 MG tablet Take 10 mg by mouth daily.      . BIOTIN PO Take 1 tablet by mouth daily.      . calcitRIOL (ROCALTROL) 0.5 MCG capsule Take 0.5 mcg by mouth daily.      . labetalol (NORMODYNE) 200 MG tablet Take 400 mg by mouth 2 (two) times daily.       . oxyCODONE (ROXICODONE) 5 MG immediate release tablet Take 1 tablet (5 mg total) by mouth every 6 (six) hours as needed for pain.  20 tablet  0  . promethazine (PHENERGAN) 25 MG tablet Take 25 mg by mouth every 6 (six) hours as needed. For nausea/vomiting      . sevelamer (RENVELA) 800 MG tablet Take 800 mg by mouth 3 (three) times   daily with meals.      . torsemide (DEMADEX) 20 MG tablet Take 20 mg by mouth daily.       No current facility-administered medications on file prior to visit.    No Known Allergies  Review of Systems (Positive items checked otherwise negative)  General: [ ] Weight loss, [ ] Weight gain, [ ]  Loss of appetite, [ ] Fever  Neurologic: [ ] Dizziness, [ ] Blackouts, [ ] Headaches, [ ] Seizure  Ear/Nose/Throat: [ ] Change in eyesight, [ ] Change in hearing, [ ] Nose bleeds, [ ] Sore throat  Vascular: [ ] Pain in legs with walking, [ ] Pain in feet while lying flat, [ ] Non-healing ulcer, Stroke, [ ] "Mini stroke", [ ] Slurred speech, [ ] Temporary blindness, [ ] Blood clot in vein, [ ] Phlebitis  Pulmonary: [ ] Home oxygen, [ ] Productive cough, [ ] Bronchitis, [ ] Coughing up blood, [ ] Asthma, [ ] Wheezing  Musculoskeletal: [ ] Arthritis, [ ] Joint pain, [ ] Muscle  pain  Cardiac: [ ] Chest pain, [ ] Chest tightness/pressure, [ ] Shortness of breath when lying flat, [ ] Shortness of breath with exertion, [ ] Palpitations, [ ] Heart murmur, [ ] Arrythmia,  [ ] Atrial fibrillation  Hematologic: [ ] Bleeding problems, [ ] Clotting disorder, [ ] Anemia  Psychiatric:  [ ] Depression, [ ] Anxiety, [ ] Attention deficit disorder  Gastrointestinal:  [ ] Black stool,[ ]  Blood in stool, [ ] Peptic ulcer disease, [ ] Reflux,[ ] Hiatal hernia, [ ] Trouble swallowing, [ ] Diarrhea, [ ] Constipation  Urinary:  [x] Kidney disease, [ ] Burning with urination, [ ] Frequent urination, [ ] Difficulty urinating  Skin: [ ] Ulcers, [ ] Rashes    Physical Examination  Filed Vitals:   03/04/13 1533  BP: 185/75  Pulse: 67  Resp: 18  Height: 5' 4" (1.626 m)  Weight: 175 lb (79.379 kg)   Body mass index is 30.02 kg/(m^2).  General: A&O x 3, WD, obese  Pulmonary: Sym exp, good air movt, CTAB, no rales, rhonchi, & wheezing  Cardiac: RRR, Nl S1, S2, no Murmurs, rubs or gallops  Gastrointestinal: soft, NTND, -G/R, - HSM, - masses, - CVAT B  Musculoskeletal: M/S 5/5 throughout , Extremities without  ischemic changes , palpable thrill in access in L BC AVF, + bruit in access  Neurologic: Pain and light touch intact in extremities , Motor exam as listed above  Non-Invasive Vascular Imaging  L access duplex (Date: 03/04/13):   Patent L BC AVF  Anastomosis: 312 c/s  Distal vein: 901 c/s  Diameter: 2.6-8.4 c/s  Medical Decision Making  Patricia Ayala is a 79 y.o. female who presents with poorly functioning L BC AVF, ESRD requiring hemodialysis.   Based on the access duplex, a concern with distal fistula stenosis is raised, which may be compromising inflow to the fistula.    I recommend L arm fistulogram, possible intervention to help determine if this is so.  I discussed with the patient the nature of angiographic procedures, especially the limited  patencies of any endovascular intervention.  The patient is aware of that the risks of an angiographic procedure include but are not limited to: bleeding, infection, access site complications, renal failure, embolization, rupture of vessel, dissection, possible need for emergent surgical intervention, possible need for surgical procedures to treat the patient's pathology, and stroke and death.  The   patient is aware of the risks and agrees to proceed.  The patient has agreed to proceed with the above procedure which will be scheduled 16 MAY 14.  Brian Chen, MD [facility] [place] Office: [phone] Pager: [phone]  03/04/2013, 4:29 PM   

## 2013-07-29 ENCOUNTER — Other Ambulatory Visit: Payer: Self-pay | Admitting: *Deleted

## 2013-07-29 DIAGNOSIS — T82598A Other mechanical complication of other cardiac and vascular devices and implants, initial encounter: Secondary | ICD-10-CM

## 2013-08-06 ENCOUNTER — Encounter: Payer: Self-pay | Admitting: Vascular Surgery

## 2013-08-07 ENCOUNTER — Ambulatory Visit (INDEPENDENT_AMBULATORY_CARE_PROVIDER_SITE_OTHER): Payer: Medicare Other | Admitting: Vascular Surgery

## 2013-08-07 ENCOUNTER — Encounter: Payer: Self-pay | Admitting: Vascular Surgery

## 2013-08-07 ENCOUNTER — Ambulatory Visit (HOSPITAL_COMMUNITY)
Admission: RE | Admit: 2013-08-07 | Discharge: 2013-08-07 | Disposition: A | Discharge status: Home / Self Care | Payer: Medicare Other | Source: Ambulatory Visit | Attending: Vascular Surgery | Admitting: Vascular Surgery

## 2013-08-07 VITALS — BP 156/64 | HR 63 | Ht 64.0 in | Wt 174.8 lb

## 2013-08-07 DIAGNOSIS — N186 End stage renal disease: Secondary | ICD-10-CM

## 2013-08-07 DIAGNOSIS — T82598A Other mechanical complication of other cardiac and vascular devices and implants, initial encounter: Secondary | ICD-10-CM | POA: Insufficient documentation

## 2013-08-07 DIAGNOSIS — Y849 Medical procedure, unspecified as the cause of abnormal reaction of the patient, or of later complication, without mention of misadventure at the time of the procedure: Secondary | ICD-10-CM | POA: Insufficient documentation

## 2013-08-07 NOTE — Progress Notes (Signed)
[facility] [place]  Established Dialysis Access  History of Present Illness  Patricia Ayala is a 79 y.o. (Dec 18, 1928) female who presents for re-evaluation of her L BC AVF.  The patient notes she is able to complete her dialysis without any interrupts.  She also notes the light on the machine appears to be green.  By report the flow rate machine demonstrated poor rates.  The patient denies any steal sx.  She has not had any bleeding complications.  The patient's PMH, PSH, SH, FamHx, Med, and Allergies are unchanged from 03/09/13.  On ROS today: no steal sx, no bleeding complications from AVF  Physical Examination  Filed Vitals:   08/07/13 1529  BP: 156/64  Pulse: 63  Height: 5\' 4"  (1.626 m)  Weight: 174 lb 12.8 oz (79.289 kg)  SpO2: 100%   Body mass index is 29.99 kg/(m^2).  General: A&O x 3, WD, WN  Pulmonary: Sym exp, good air movt, CTAB, no rales, rhonchi, & wheezing  Cardiac: RRR, Nl S1, S2, no Murmurs, rubs or gallops  Vascular: Vessel Right Left  Radial Palpable Palpable  Brachial Palpable Palpable   Gastrointestinal: soft, NTND, -G/R, - HSM, - masses, - CVAT B  Musculoskeletal: M/S 5/5 throughout , Extremities without  ischemic changes , palpable thrill in access in Left upper arm, normal bruit in access  Neurologic: Pain and light touch intact in extremities , Motor exam as listed above  Non-Invasive Vascular Imaging  Left arm Access Duplex  (Date: 08/07/2013):   Diameters:  2.7-7.8 mm  Depth:  1.7-8.8 mm  PSV:  687 c/s at anastomosis  2 pages of outside nephrology chart was reviewed.  No flow rate data is documented.  Medical Decision Making  Patricia Ayala is a 79 y.o. female who presents with ESRD requiring hemodialysis, tapered distal Left brachiocephalic arteriovenous fistula    On her May 2014 fistulogram, she had a tapered distal fistula, so the access duplex demonstrates to difference.  The PSV consequentially reflects  this tapered fistula.  As long as the flow rates are adequate on hemodialysis, I doubt there is any benefit to any intervention.    Additionally, the patient is not interested in a repeat fistulogram at this point.  Leonides Sake, MD [facility] [place] Office: [phone] Pager: [phone]  08/07/2013, 4:53 PM

## 2013-10-07 ENCOUNTER — Other Ambulatory Visit: Payer: Self-pay | Admitting: *Deleted

## 2013-10-07 DIAGNOSIS — T82598A Other mechanical complication of other cardiac and vascular devices and implants, initial encounter: Secondary | ICD-10-CM

## 2013-10-23 ENCOUNTER — Ambulatory Visit: Payer: Medicare Other | Admitting: Vascular Surgery

## 2013-10-23 ENCOUNTER — Other Ambulatory Visit (HOSPITAL_COMMUNITY): Payer: Medicare Other

## 2013-11-12 ENCOUNTER — Encounter: Payer: Self-pay | Admitting: Vascular Surgery

## 2013-11-13 ENCOUNTER — Ambulatory Visit (HOSPITAL_COMMUNITY)
Admission: RE | Admit: 2013-11-13 | Discharge: 2013-11-13 | Disposition: A | Discharge status: Home / Self Care | Payer: Medicare Other | Source: Ambulatory Visit | Attending: Vascular Surgery | Admitting: Vascular Surgery

## 2013-11-13 ENCOUNTER — Encounter: Payer: Self-pay | Admitting: Vascular Surgery

## 2013-11-13 ENCOUNTER — Encounter (INDEPENDENT_AMBULATORY_CARE_PROVIDER_SITE_OTHER): Payer: Self-pay

## 2013-11-13 ENCOUNTER — Ambulatory Visit (INDEPENDENT_AMBULATORY_CARE_PROVIDER_SITE_OTHER): Payer: Medicare Other | Admitting: Vascular Surgery

## 2013-11-13 ENCOUNTER — Other Ambulatory Visit: Payer: Self-pay | Admitting: Vascular Surgery

## 2013-11-13 VITALS — BP 145/61 | HR 65 | Ht 64.0 in | Wt 172.7 lb

## 2013-11-13 DIAGNOSIS — T82598A Other mechanical complication of other cardiac and vascular devices and implants, initial encounter: Secondary | ICD-10-CM

## 2013-11-13 DIAGNOSIS — Z4931 Encounter for adequacy testing for hemodialysis: Secondary | ICD-10-CM

## 2013-11-13 DIAGNOSIS — N186 End stage renal disease: Secondary | ICD-10-CM

## 2013-11-13 DIAGNOSIS — T82898A Other specified complication of vascular prosthetic devices, implants and grafts, initial encounter: Secondary | ICD-10-CM | POA: Insufficient documentation

## 2013-11-13 DIAGNOSIS — Y832 Surgical operation with anastomosis, bypass or graft as the cause of abnormal reaction of the patient, or of later complication, without mention of misadventure at the time of the procedure: Secondary | ICD-10-CM | POA: Insufficient documentation

## 2013-11-13 NOTE — Progress Notes (Signed)
Established Dialysis Access  History of Present Illness  Patricia Ayala is a 80 y.o. (08-Sep-1929) female who presents for re-evaluation of left BC AVF.  The patient's flow rates have been poor and they intermittent have difficulties with cannulation.  She had limited infiltration recently on HD.  She has had to have them discontinued HD a few times due to pain.  Past Medical History  Diagnosis Date  . Anemia 02/11/2012  . Vitamin D deficiency   . Hyperparathyroidism, secondary   . H/O metabolic acidosis   . Anxiety   . Hypertension     Dr. Regino SchultzeMcGough, [place],   . Chronic kidney disease     not on dialysis yet, Tues, thurs, sat    Past Surgical History  Procedure Laterality Date  . Abdominal hysterectomy    . Cholecystectomy    . Eye surgery      bilateral cataracts, /w IOL - 2013  . Av fistula placement  04/01/2012    Procedure: ARTERIOVENOUS (AV) FISTULA CREATION;  Surgeon: Sherren Kernsharles E Fields, MD;  [facility];  Service: Vascular;  Laterality: Left;  . Colon surgery      for a blockage  . Insertion of dialysis catheter  04/10/2012    Procedure: INSERTION OF DIALYSIS CATHETER;  Surgeon: Pryor OchoaJames D Lawson, MD;  [facility];  Service: Vascular;  Laterality: N/A;  Insertion Diatek Catheter Right Internal Jugular  . Ligation of competing branches of arteriovenous fistula  11/07/2012    Procedure: LIGATION OF COMPETING BRANCHES OF ARTERIOVENOUS FISTULA;  Surgeon: Chuck Hinthristopher S Dickson, MD;  [facility];  Service: Vascular;  Laterality: Left;  Brachio/Cephalic Fistula    History   Social History  . Marital Status: Married    Spouse Name: N/A    Number of Children: N/A  . Years of Education: N/A   Occupational History  . Not on file.   Social History Main Topics  . Smoking status: Never Smoker   . Smokeless tobacco: Never Used  . Alcohol Use: No  . Drug Use: No  . Sexual Activity: Not on file   Other Topics Concern  . Not on file   Social History Narrative    . No narrative on file    Family History  Problem Relation Age of Onset  . Stroke Mother   . Cancer Father   . Heart attack Father     Current Outpatient Prescriptions on File Prior to Visit  Medication Sig Dispense Refill  . ALPRAZolam (XANAX) 0.5 MG tablet Take 1 tablet by mouth every 6 (six) hours as needed. For anxiety      . amLODipine (NORVASC) 10 MG tablet Take 10 mg by mouth daily.      Marland Kitchen. BIOTIN PO Take 1 tablet by mouth daily.      . calcitRIOL (ROCALTROL) 0.5 MCG capsule Take 0.5 mcg by mouth daily.      Marland Kitchen. doxycycline (VIBRA-TABS) 100 MG tablet Take 100 mg by mouth 2 (two) times daily.       Marland Kitchen. HYDROcodone-acetaminophen (NORCO/VICODIN) 5-325 MG per tablet Take 1 tablet by mouth every 8 (eight) hours as needed for pain.       Marland Kitchen. labetalol (NORMODYNE) 100 MG tablet       . labetalol (NORMODYNE) 200 MG tablet Take 400 mg by mouth 2 (two) times daily.       Marland Kitchen. lidocaine-prilocaine (EMLA) cream Apply 1 application topically as needed (for IV access).       .Marland Kitchen  nystatin-triamcinolone (MYCOLOG II) cream Apply 1 application topically 2 (two) times daily.       Marland Kitchen oxyCODONE (ROXICODONE) 5 MG immediate release tablet Take 1 tablet (5 mg total) by mouth every 6 (six) hours as needed for pain.  20 tablet  0  . promethazine (PHENERGAN) 25 MG tablet Take 25 mg by mouth every 6 (six) hours as needed. For nausea/vomiting      . simvastatin (ZOCOR) 10 MG tablet Take 10 mg by mouth at bedtime.       . torsemide (DEMADEX) 20 MG tablet Take 20 mg by mouth daily.      . sevelamer (RENVELA) 800 MG tablet Take 800 mg by mouth 3 (three) times daily with meals.       No current facility-administered medications on file prior to visit.    No Known Allergies  REVIEW OF SYSTEMS:  (Positives checked otherwise negative)  CARDIOVASCULAR:  []  chest pain, []  chest pressure, []  palpitations, []  shortness of breath when laying flat, []  shortness of breath with exertion,  []  pain in feet when walking, []  pain  in feet when laying flat, []  history of blood clot in veins (DVT), []  history of phlebitis, [x]  swelling in legs, []  varicose veins  PULMONARY:  []  productive cough, []  asthma, []  wheezing  NEUROLOGIC:  []  weakness in arms or legs, []  numbness in arms or legs, []  difficulty speaking or slurred speech, []  temporary loss of vision in one eye, [x]  dizziness  HEMATOLOGIC:  []  bleeding problems, []  problems with blood clotting too easily  MUSCULOSKEL:  []  joint pain, []  joint swelling  GASTROINTEST:  []  vomiting blood, []  blood in stool     GENITOURINARY:  []  burning with urination, []  blood in urine, [x]  ESRD-HD: T-R-Sat  PSYCHIATRIC:  []  history of major depression  INTEGUMENTARY:  [x]  rashes, []  ulcers  Physical Examination  Filed Vitals:   11/13/13 1021  BP: 145/61  Pulse: 65  Height: 5\' 4"  (1.626 m)  Weight: 172 lb 11.2 oz (78.336 kg)  SpO2: 100%   Body mass index is 29.63 kg/(m^2).  General: A&O x 3, WD, WN  Pulmonary: Sym exp, good air movt, CTAB, no rales, rhonchi, & wheezing  Cardiac: RRR, Nl S1, S2, no Murmurs, rubs or gallops  Vascular: Vessel Right Left  Radial Palpable Palpable  Ulnar Faintly Palpable Faintly Palpable  Brachial Palpable Palpable   Gastrointestinal: soft, NTND, -G/R, - HSM, - masses, - CVAT B  Musculoskeletal: M/S 5/5 throughout , Extremities without  ischemic changes , + palpable thrill in access in L BC AVF, + bruit in access, sclerotic palpable distal fistula  Neurologic: Pain and light touch intact in extremities , Motor exam as listed above  Non-Invasive Vascular Imaging  Left Arm Access Duplex  (Date: 11/13/2013):   Diameters:  1.6-9.2 cm  Depth:  0.3-1.2 cm  PSV:  879 c/s  Thrombus in distal fistula  Medical Decision Making  KEAMBER Patricia Ayala is a 80 y.o. female who presents with ESRD requiring hemodialysis, likely distal fistula stenosis with some thrombus  Pt previously has had a distal fistula taper that has kept her from  having steal sx.  At this point, I suspect she has developed some cannulation complications with the distal segment, so fistulogram of L arm with intervention is recommended. I discussed with the patient the nature of angiographic procedures, especially the limited patencies of any endovascular intervention.  The patient is aware of that the risks of an angiographic procedure include  but are not limited to: bleeding, infection, access site complications, renal failure, embolization, rupture of vessel, dissection, possible need for emergent surgical intervention, possible need for surgical procedures to treat the patient's pathology, anaphylactic reaction to contrast, and stroke and death.   The patient is aware of the risks and agrees to proceed.  The patient has agreed to proceed with the above procedure which will be scheduled 26 JAN 15.  Leonides Sake, MD [facility] Office: [phone] Pager: [phone]  11/13/2013, 10:46 AM

## 2013-11-20 ENCOUNTER — Other Ambulatory Visit: Payer: Self-pay

## 2013-11-22 MED ORDER — SODIUM CHLORIDE 0.9 % IJ SOLN: 3.0000 mL | INTRAMUSCULAR | Status: DC | PRN

## 2013-11-23 ENCOUNTER — Other Ambulatory Visit: Payer: Self-pay | Admitting: *Deleted

## 2013-11-23 ENCOUNTER — Encounter (HOSPITAL_COMMUNITY)
Admission: RE | Disposition: A | Discharge status: Home / Self Care | Payer: Self-pay | Source: Ambulatory Visit | Attending: Vascular Surgery

## 2013-11-23 ENCOUNTER — Ambulatory Visit (HOSPITAL_COMMUNITY)
Admission: RE | Admit: 2013-11-23 | Discharge: 2013-11-23 | Disposition: A | Discharge status: Home / Self Care | Payer: Medicare Other | Source: Ambulatory Visit | Attending: Vascular Surgery | Admitting: Vascular Surgery

## 2013-11-23 DIAGNOSIS — T82898A Other specified complication of vascular prosthetic devices, implants and grafts, initial encounter: Secondary | ICD-10-CM

## 2013-11-23 DIAGNOSIS — N189 Chronic kidney disease, unspecified: Secondary | ICD-10-CM | POA: Insufficient documentation

## 2013-11-23 DIAGNOSIS — E213 Hyperparathyroidism, unspecified: Secondary | ICD-10-CM | POA: Insufficient documentation

## 2013-11-23 DIAGNOSIS — I129 Hypertensive chronic kidney disease with stage 1 through stage 4 chronic kidney disease, or unspecified chronic kidney disease: Secondary | ICD-10-CM | POA: Insufficient documentation

## 2013-11-23 DIAGNOSIS — Z4931 Encounter for adequacy testing for hemodialysis: Secondary | ICD-10-CM

## 2013-11-23 DIAGNOSIS — Y832 Surgical operation with anastomosis, bypass or graft as the cause of abnormal reaction of the patient, or of later complication, without mention of misadventure at the time of the procedure: Secondary | ICD-10-CM | POA: Insufficient documentation

## 2013-11-23 DIAGNOSIS — N186 End stage renal disease: Secondary | ICD-10-CM

## 2013-11-23 DIAGNOSIS — F411 Generalized anxiety disorder: Secondary | ICD-10-CM | POA: Insufficient documentation

## 2013-11-23 HISTORY — PX: SHUNTOGRAM: SHX5491

## 2013-11-23 LAB — POCT I-STAT, CHEM 8
BUN: 42 mg/dL — ABNORMAL HIGH (ref 6–23)
Calcium, Ion: 1.12 mmol/L — ABNORMAL LOW (ref 1.13–1.30)
Chloride: 107 mEq/L (ref 96–112)
Creatinine, Ser: 5.3 mg/dL — ABNORMAL HIGH (ref 0.50–1.10)
Glucose, Bld: 102 mg/dL — ABNORMAL HIGH (ref 70–99)
HEMATOCRIT: 36 % (ref 36.0–46.0)
Hemoglobin: 12.2 g/dL (ref 12.0–15.0)
POTASSIUM: 4.1 meq/L (ref 3.7–5.3)
Sodium: 143 mEq/L (ref 137–147)
TCO2: 25 mmol/L (ref 0–100)

## 2013-11-23 SURGERY — ASSESSMENT, SHUNT FUNCTION, WITH CONTRAST RADIOGRAPHIC STUDY
Anesthesia: LOCAL

## 2013-11-23 MED ORDER — HEPARIN (PORCINE) IN NACL 2-0.9 UNIT/ML-% IJ SOLN
INTRAMUSCULAR | Status: AC
  Filled 2013-11-23: qty 500

## 2013-11-23 MED ORDER — ACETAMINOPHEN 325 MG PO TABS: 650.0000 mg | ORAL_TABLET | ORAL | Status: DC | PRN

## 2013-11-23 MED ORDER — SODIUM CHLORIDE 0.9 % IJ SOLN: 3.0000 mL | INTRAMUSCULAR | Status: DC | PRN

## 2013-11-23 MED ORDER — SODIUM CHLORIDE 0.9 % IV SOLN: 250.0000 mL | INTRAVENOUS | Status: DC | PRN

## 2013-11-23 MED ORDER — HEPARIN SODIUM (PORCINE) 1000 UNIT/ML IJ SOLN
INTRAMUSCULAR | Status: AC
Start: 2013-11-23 — End: 2013-11-23
  Filled 2013-11-23: qty 1

## 2013-11-23 MED ORDER — SODIUM CHLORIDE 0.9 % IJ SOLN: 3.0000 mL | Freq: Two times a day (BID) | INTRAMUSCULAR | Status: DC

## 2013-11-23 MED ORDER — ONDANSETRON HCL 4 MG/2ML IJ SOLN
4.0000 mg | Freq: Four times a day (QID) | INTRAMUSCULAR | Status: DC | PRN
Start: 2013-11-23 — End: 2013-11-23

## 2013-11-23 MED ORDER — LIDOCAINE HCL (PF) 1 % IJ SOLN
INTRAMUSCULAR | Status: AC
Start: 2013-11-23 — End: 2013-11-23
  Filled 2013-11-23: qty 30

## 2013-11-23 NOTE — Discharge Instructions (Signed)
Angiography  Angiography is a procedure used to look at the blood vessels that carry blood to different parts of your body (arteries). In this procedure, dye is injected through a long, thin tube (catheter) into an artery. X-rays are then taken. The X-rays will show if there is a blockage or problem in a blood vessel.   LET YOUR HEALTH CARE PROVIDER KNOW ABOUT:   Any allergies you have, including allergies to shellfish or contrast dye.    All medicines you are taking, including vitamins, herbs, eye drops, creams, and over-the-counter medicines.    Previous problems you or members of your family have had with the use of anesthetics.    Any blood disorders you have.    Previous surgeries you have had.   Any previous kidney problems or failure you have had.   Medical conditions you have.    Possibility of pregnancy, if this applies.  RISKS AND COMPLICATIONS  Generally, angiography is a safe procedure. However, as with any procedure, complications can occur. Possible complications include:   Injury to the blood vessels, including rupture or bleeding.   Infection or bruising at the catheter site.   Allergic reaction to the dye or contrast used.   Kidney damage from the dye or contrast used.   Blood clots that can lead to a stroke or heart attack.  BEFORE THE PROCEDURE   Do not eat or drink after midnight on the night before the procedure, or as directed by your health care provider.    Ask your health care provider if you may drink enough water to take any needed medicines the morning of the procedure.   PROCEDURE   You may be given a medicine to help you relax (sedative) before and during the procedure. This medicine is given through an IV access tube that is inserted into one of your veins.    The area where the catheter will be inserted will be washed and shaved. This is usually done in the groin but may be done in the fold of your arm (near your elbow) or in the wrist.   A medicine will be  given to numb the area where the catheter will be inserted (local anesthetic).   The catheter will be inserted with a guide wire into an artery. The catheter is guided by using a type of X-ray (fluoroscopy) to the blood vessel being examined.    Dye is then injected into the catheter, and X-rays are taken. The dye helps to show where any narrowing or blockages are located.   AFTER THE PROCEDURE    If the procedure is done through the leg, you will be kept in bed lying flat for several hours. You will be instructed to not bend or cross your legs.   The insertion site will be checked frequently.   The pulse in your feet or wrist will be checked frequently.   Additional blood tests, X-rays, and electrocardiography may be done.    You may need to stay in the hospital overnight for observation.   Document Released: 07/25/2005 Document Revised: 06/17/2013 Document Reviewed: 03/18/2013  [employer] Patient Information 2014 [employer].

## 2013-11-23 NOTE — Progress Notes (Signed)
Received pt from cath procedure alert .  Pt complain of soreness at site on left arm.  Noted no swelling , or bleeding.

## 2013-11-23 NOTE — H&P (View-Only) (Signed)
Established Dialysis Access  History of Present Illness  Patricia Ayala is a 80 y.o. (08-Sep-1929) female who presents for re-evaluation of left BC AVF.  The patient's flow rates have been poor and they intermittent have difficulties with cannulation.  She had limited infiltration recently on HD.  She has had to have them discontinued HD a few times due to pain.  Past Medical History  Diagnosis Date  . Anemia 02/11/2012  . Vitamin D deficiency   . Hyperparathyroidism, secondary   . H/O metabolic acidosis   . Anxiety   . Hypertension     Dr. Regino SchultzeMcGough, [place],   . Chronic kidney disease     not on dialysis yet, Tues, thurs, sat    Past Surgical History  Procedure Laterality Date  . Abdominal hysterectomy    . Cholecystectomy    . Eye surgery      bilateral cataracts, /w IOL - 2013  . Av fistula placement  04/01/2012    Procedure: ARTERIOVENOUS (AV) FISTULA CREATION;  Surgeon: Sherren Kernsharles E Fields, MD;  [facility];  Service: Vascular;  Laterality: Left;  . Colon surgery      for a blockage  . Insertion of dialysis catheter  04/10/2012    Procedure: INSERTION OF DIALYSIS CATHETER;  Surgeon: Pryor OchoaJames D Lawson, MD;  [facility];  Service: Vascular;  Laterality: N/A;  Insertion Diatek Catheter Right Internal Jugular  . Ligation of competing branches of arteriovenous fistula  11/07/2012    Procedure: LIGATION OF COMPETING BRANCHES OF ARTERIOVENOUS FISTULA;  Surgeon: Chuck Hinthristopher S Dickson, MD;  [facility];  Service: Vascular;  Laterality: Left;  Brachio/Cephalic Fistula    History   Social History  . Marital Status: Married    Spouse Name: N/A    Number of Children: N/A  . Years of Education: N/A   Occupational History  . Not on file.   Social History Main Topics  . Smoking status: Never Smoker   . Smokeless tobacco: Never Used  . Alcohol Use: No  . Drug Use: No  . Sexual Activity: Not on file   Other Topics Concern  . Not on file   Social History Narrative    . No narrative on file    Family History  Problem Relation Age of Onset  . Stroke Mother   . Cancer Father   . Heart attack Father     Current Outpatient Prescriptions on File Prior to Visit  Medication Sig Dispense Refill  . ALPRAZolam (XANAX) 0.5 MG tablet Take 1 tablet by mouth every 6 (six) hours as needed. For anxiety      . amLODipine (NORVASC) 10 MG tablet Take 10 mg by mouth daily.      Marland Kitchen. BIOTIN PO Take 1 tablet by mouth daily.      . calcitRIOL (ROCALTROL) 0.5 MCG capsule Take 0.5 mcg by mouth daily.      Marland Kitchen. doxycycline (VIBRA-TABS) 100 MG tablet Take 100 mg by mouth 2 (two) times daily.       Marland Kitchen. HYDROcodone-acetaminophen (NORCO/VICODIN) 5-325 MG per tablet Take 1 tablet by mouth every 8 (eight) hours as needed for pain.       Marland Kitchen. labetalol (NORMODYNE) 100 MG tablet       . labetalol (NORMODYNE) 200 MG tablet Take 400 mg by mouth 2 (two) times daily.       Marland Kitchen. lidocaine-prilocaine (EMLA) cream Apply 1 application topically as needed (for IV access).       .Marland Kitchen  nystatin-triamcinolone (MYCOLOG II) cream Apply 1 application topically 2 (two) times daily.       Marland Kitchen oxyCODONE (ROXICODONE) 5 MG immediate release tablet Take 1 tablet (5 mg total) by mouth every 6 (six) hours as needed for pain.  20 tablet  0  . promethazine (PHENERGAN) 25 MG tablet Take 25 mg by mouth every 6 (six) hours as needed. For nausea/vomiting      . simvastatin (ZOCOR) 10 MG tablet Take 10 mg by mouth at bedtime.       . torsemide (DEMADEX) 20 MG tablet Take 20 mg by mouth daily.      . sevelamer (RENVELA) 800 MG tablet Take 800 mg by mouth 3 (three) times daily with meals.       No current facility-administered medications on file prior to visit.    No Known Allergies  REVIEW OF SYSTEMS:  (Positives checked otherwise negative)  CARDIOVASCULAR:  []  chest pain, []  chest pressure, []  palpitations, []  shortness of breath when laying flat, []  shortness of breath with exertion,  []  pain in feet when walking, []  pain  in feet when laying flat, []  history of blood clot in veins (DVT), []  history of phlebitis, [x]  swelling in legs, []  varicose veins  PULMONARY:  []  productive cough, []  asthma, []  wheezing  NEUROLOGIC:  []  weakness in arms or legs, []  numbness in arms or legs, []  difficulty speaking or slurred speech, []  temporary loss of vision in one eye, [x]  dizziness  HEMATOLOGIC:  []  bleeding problems, []  problems with blood clotting too easily  MUSCULOSKEL:  []  joint pain, []  joint swelling  GASTROINTEST:  []  vomiting blood, []  blood in stool     GENITOURINARY:  []  burning with urination, []  blood in urine, [x]  ESRD-HD: T-R-Sat  PSYCHIATRIC:  []  history of major depression  INTEGUMENTARY:  [x]  rashes, []  ulcers  Physical Examination  Filed Vitals:   11/13/13 1021  BP: 145/61  Pulse: 65  Height: 5\' 4"  (1.626 m)  Weight: 172 lb 11.2 oz (78.336 kg)  SpO2: 100%   Body mass index is 29.63 kg/(m^2).  General: A&O x 3, WD, WN  Pulmonary: Sym exp, good air movt, CTAB, no rales, rhonchi, & wheezing  Cardiac: RRR, Nl S1, S2, no Murmurs, rubs or gallops  Vascular: Vessel Right Left  Radial Palpable Palpable  Ulnar Faintly Palpable Faintly Palpable  Brachial Palpable Palpable   Gastrointestinal: soft, NTND, -G/R, - HSM, - masses, - CVAT B  Musculoskeletal: M/S 5/5 throughout , Extremities without  ischemic changes , + palpable thrill in access in L BC AVF, + bruit in access, sclerotic palpable distal fistula  Neurologic: Pain and light touch intact in extremities , Motor exam as listed above  Non-Invasive Vascular Imaging  Left Arm Access Duplex  (Date: 11/13/2013):   Diameters:  1.6-9.2 cm  Depth:  0.3-1.2 cm  PSV:  879 c/s  Thrombus in distal fistula  Medical Decision Making  [name] is a 80 y.o. female who presents with ESRD requiring hemodialysis, likely distal fistula stenosis with some thrombus  Pt previously has had a distal fistula taper that has kept her from  having steal sx.  At this point, I suspect she has developed some cannulation complications with the distal segment, so fistulogram of L arm with intervention is recommended. I discussed with the patient the nature of angiographic procedures, especially the limited patencies of any endovascular intervention.  The patient is aware of that the risks of an angiographic procedure include  but are not limited to: bleeding, infection, access site complications, renal failure, embolization, rupture of vessel, dissection, possible need for emergent surgical intervention, possible need for surgical procedures to treat the patient's pathology, anaphylactic reaction to contrast, and stroke and death.   The patient is aware of the risks and agrees to proceed.  The patient has agreed to proceed with the above procedure which will be scheduled 26 JAN 15.  Leonides Sake, MD [facility] Office: [phone] Pager: [phone]  11/13/2013, 10:46 AM

## 2013-11-23 NOTE — Interval H&P Note (Signed)
[facility]  History and Physical Update  The patient was interviewed and re-examined.  The patient's previous History and Physical has been reviewed and is unchanged.  There is no change in the plan of care: L fistulogram, possible intervention.  Leonides Sake, MD [facility] [place] Office: [phone] Pager: [phone]  11/23/2013, 10:53 AM

## 2013-11-23 NOTE — Progress Notes (Signed)
Pt stated that she ate and egg sandwich and a cup of juice this AM at 0830.  Md paged and he is aware.  Will continue to monitor closely.

## 2013-11-23 NOTE — Op Note (Signed)
OPERATIVE NOTE   PROCEDURE: 1.  left brachiocephalic arteriovenous fistula cannulation under ultrasound guidance 2.  left arm fistulogram 3.  Percutaneous thrombectomy of distal fistula via balloon maceration  3.  Venoplasty of cephalic vein x (3 mm x 40 mm, 4 mm x 40 mm)   PRE-OPERATIVE DIAGNOSIS: Malfunctioning left brachiocephalic arteriovenous fistula  POST-OPERATIVE DIAGNOSIS: same as above   SURGEON: Adele Barthel, MD  ANESTHESIA: local  ESTIMATED BLOOD LOSS: 5 cc  FINDING(S): 1. Small amount of thrombus in distal fistula: resolved after serial venoplasty 2. Tapered distal 2.5 cm of fistula: 3 mm residual lumen after serial venoplasty 3. Widely patent fistula and central venous structures otherwise   SPECIMEN(S):  None  CONTRAST: 30 cc  INDICATIONS: Patricia Ayala is a 80 y.o. female who presents with malfunctioning left brachiocephalic arteriovenous fistula.  The patient is scheduled for left arm fistulogram, possible intervention.  The patient is aware the risks include but are not limited to: bleeding, infection, thrombosis of the cannulated access, and possible anaphylactic reaction to the contrast.  The patient is aware of the risks of the procedure and elects to proceed forward.  DESCRIPTION: After full informed written consent was obtained, the patient was brought back to the angiography suite and placed supine upon the angiography table.  The patient was connected to monitoring equipment.  The left arm was prepped and draped in the standard fashion for a percutaneous access intervention.  Under ultrasound guidance, the left brachiocephalic arteriovenous fistula was cannulated with a micropuncture needle.  The microwire was advanced into the fistula and the needle was exchanged for the a microsheath, which was lodged 2 cm into the access.  The wire was removed and the sheath was connected to the IV extension tubing.  Hand injections were completed to image the  access from the antecubitum up to the level of axilla.  The central venous structures were also imaged by hand injections.  Based on the images, this patient will need: venoplasty and balloon maceration of thrombus.  A Benson wire was advanced into the axillary vein and the sheath was exchanged for a short 6-Fr sheath.  Based on the the imaging, a 3 mm x 40 mm angioplasty balloon was selected.  The balloon was centered around the distal stenosis and inflated to 10 atm for 2 minutes.  At this point, the balloon was exchanged for a 4 mm x 40 mm angioplasty balloon.  The balloon was centered around the stenosis and inflated to 10 atm for 2 minutes.  On completion imaging, the residual thrombus was resolved but there appeared to be some recoil.  I placed a high pressure 4 mm x 40 mm balloon and reinflated the balloon in the distal fistula at 16 atm for 2 minutes.  On the completion imaging, there was a 3 mm distal lumen present without any signs of rupture.  The previous thrombus was gone.  I did not feel further venoplasty would be successful and felt rupture might be more likely.  If this fistula fails to function adequately, likely surgical revision is needed.  Based on the completion imaging, no further intervention is necessary.  The wire and balloon were removed from the sheath.  A 4-0 Monocryl purse-string suture was sewn around the sheath.  The sheath was removed while tying down the suture.  A sterile bandage was applied to the puncture site.  COMPLICATIONS: none  CONDITION: stable  Adele Barthel, MD [facility] Office:  [phone] Pager: [phone]  11/23/2013 4:50 PM

## 2013-11-23 NOTE — Progress Notes (Signed)
Discharge instruction given to pt and CG.  Pt and CG able to verbalize understanding.  Pt to car via wheelchair. 

## 2013-11-25 ENCOUNTER — Telehealth: Payer: Self-pay | Admitting: Vascular Surgery

## 2013-11-25 NOTE — Telephone Encounter (Addendum)
Message copied by Fredrich Birks on Wed Nov 25, 2013 10:34 AM ------      Message from: Lorin Mercy K      Created: Mon Nov 23, 2013  5:15 PM      Regarding: Schedule                   ----- Message -----         From: Fransisco Hertz, MD         Sent: 11/23/2013   4:57 PM           To: Reuel Derby, [facility] [address]            NAIDELYN KILIAN      [phone]      05/14/1929            PROCEDURE:      1.  left brachiocephalic arteriovenous fistula cannulation under ultrasound guidance      2.  left arm fistulogram      3.  Percutaneous thrombectomy of distal fistula via balloon maceration        3.  Venoplasty of cephalic vein x (3 mm x 40 mm, 4 mm x 40 mm)            Follow-up: 3 months            Orders(s) for follow-up: L access duplex       ------  11/25/13: spoke with patient to schedule, dpm

## 2014-03-04 ENCOUNTER — Encounter: Payer: Self-pay | Admitting: Vascular Surgery

## 2014-03-05 ENCOUNTER — Encounter: Payer: Medicare Other | Admitting: Vascular Surgery

## 2014-03-05 ENCOUNTER — Other Ambulatory Visit (HOSPITAL_COMMUNITY): Payer: Medicare Other

## 2014-04-01 ENCOUNTER — Encounter: Payer: Self-pay | Admitting: Vascular Surgery

## 2014-04-02 ENCOUNTER — Ambulatory Visit (HOSPITAL_COMMUNITY)
Admission: RE | Admit: 2014-04-02 | Discharge: 2014-04-02 | Disposition: A | Discharge status: Home / Self Care | Payer: Medicare Other | Source: Ambulatory Visit | Attending: Vascular Surgery | Admitting: Vascular Surgery

## 2014-04-02 ENCOUNTER — Ambulatory Visit (INDEPENDENT_AMBULATORY_CARE_PROVIDER_SITE_OTHER): Payer: Medicare Other | Admitting: Vascular Surgery

## 2014-04-02 ENCOUNTER — Encounter: Payer: Self-pay | Admitting: Vascular Surgery

## 2014-04-02 ENCOUNTER — Other Ambulatory Visit: Payer: Self-pay | Admitting: Vascular Surgery

## 2014-04-02 VITALS — BP 165/72 | HR 59 | Ht 64.0 in | Wt 170.0 lb

## 2014-04-02 DIAGNOSIS — Z4931 Encounter for adequacy testing for hemodialysis: Secondary | ICD-10-CM

## 2014-04-02 DIAGNOSIS — T82598A Other mechanical complication of other cardiac and vascular devices and implants, initial encounter: Secondary | ICD-10-CM

## 2014-04-02 DIAGNOSIS — N186 End stage renal disease: Secondary | ICD-10-CM

## 2014-04-02 DIAGNOSIS — T82898A Other specified complication of vascular prosthetic devices, implants and grafts, initial encounter: Secondary | ICD-10-CM | POA: Insufficient documentation

## 2014-04-02 DIAGNOSIS — Y832 Surgical operation with anastomosis, bypass or graft as the cause of abnormal reaction of the patient, or of later complication, without mention of misadventure at the time of the procedure: Secondary | ICD-10-CM | POA: Insufficient documentation

## 2014-04-02 NOTE — Progress Notes (Signed)
    Established Dialysis Access  History of Present Illness  Patricia Ayala is a 80 y.o. (Sep 24, 1929) female who presents for re-evaluation of L BC AVF.  Left BC AVF underwent venoplasty of distal vein on 11/23/13.  The patient denies any flow rate problems.  She has had no bleeding complications.  The patient's PMH, PSH, SH, FamHx, Med, and Allergies are unchanged from 11/23/13.  On ROS today: no steal syndrome, no bleeding complications  Physical Examination  Filed Vitals:   04/02/14 1454  BP: 165/72  Pulse: 59  Height: 5\' 4"  (1.626 m)  Weight: 170 lb (77.111 kg)  SpO2: 100%   Body mass index is 29.17 kg/(m^2).  General: A&O x 3, WD, WN  Pulmonary: Sym exp, good air movt  Cardiac: RRR   Vascular: Vessel Right Left  Radial Palpable Faintly Palpable  Brachial Palpable Palpable   Musculoskeletal: M/S 5/5 throughout , palpable thrill in access in L UA AVG, + bruit in access  Neurologic: Pain and light touch intact in extremities   Non-Invasive Vascular Imaging  Left Arm Access Duplex  (Date: 04/02/2014):   Diameters:  2.0-9.6 mm  Depth:  0.28-1.0 cm  PSV:  103-779 c/s  Medical Decision Making  Patricia Ayala is a 80 y.o. female who presents with ESRD requiring hemodialysis, recurrent distal L BC AVF stenosis   The patient does not want to undergo another percutaneous procedure in the future.  If she develop flow rate issues, I recommend surgically replacing the distal fistula with a tapered graft.  Leonides Sake, MD [facility] [place] Office: [phone] Pager: [phone]  04/02/2014, 3:24 PM

## 2014-05-18 ENCOUNTER — Other Ambulatory Visit: Payer: Self-pay | Admitting: *Deleted

## 2014-05-18 DIAGNOSIS — T82598A Other mechanical complication of other cardiac and vascular devices and implants, initial encounter: Secondary | ICD-10-CM

## 2014-05-21 ENCOUNTER — Encounter: Payer: Self-pay | Admitting: Surgery

## 2014-05-24 ENCOUNTER — Ambulatory Visit: Payer: Medicare Other | Admitting: Surgery

## 2014-05-24 ENCOUNTER — Other Ambulatory Visit (HOSPITAL_COMMUNITY): Payer: Medicare Other

## 2014-10-07 ENCOUNTER — Encounter (HOSPITAL_COMMUNITY): Payer: Self-pay | Admitting: Vascular Surgery

## 2014-10-30 DIAGNOSIS — D509 Iron deficiency anemia, unspecified: Secondary | ICD-10-CM | POA: Diagnosis not present

## 2014-10-30 DIAGNOSIS — N2581 Secondary hyperparathyroidism of renal origin: Secondary | ICD-10-CM | POA: Diagnosis not present

## 2014-10-30 DIAGNOSIS — Z23 Encounter for immunization: Secondary | ICD-10-CM | POA: Diagnosis not present

## 2014-10-30 DIAGNOSIS — N186 End stage renal disease: Secondary | ICD-10-CM | POA: Diagnosis not present

## 2014-10-30 DIAGNOSIS — D631 Anemia in chronic kidney disease: Secondary | ICD-10-CM | POA: Diagnosis not present

## 2014-10-30 DIAGNOSIS — Z992 Dependence on renal dialysis: Secondary | ICD-10-CM | POA: Diagnosis not present

## 2014-11-02 DIAGNOSIS — D631 Anemia in chronic kidney disease: Secondary | ICD-10-CM | POA: Diagnosis not present

## 2014-11-02 DIAGNOSIS — D509 Iron deficiency anemia, unspecified: Secondary | ICD-10-CM | POA: Diagnosis not present

## 2014-11-02 DIAGNOSIS — N186 End stage renal disease: Secondary | ICD-10-CM | POA: Diagnosis not present

## 2014-11-02 DIAGNOSIS — Z23 Encounter for immunization: Secondary | ICD-10-CM | POA: Diagnosis not present

## 2014-11-02 DIAGNOSIS — Z992 Dependence on renal dialysis: Secondary | ICD-10-CM | POA: Diagnosis not present

## 2014-11-02 DIAGNOSIS — N2581 Secondary hyperparathyroidism of renal origin: Secondary | ICD-10-CM | POA: Diagnosis not present

## 2014-11-04 ENCOUNTER — Other Ambulatory Visit: Payer: Self-pay

## 2014-11-04 DIAGNOSIS — Z23 Encounter for immunization: Secondary | ICD-10-CM | POA: Diagnosis not present

## 2014-11-04 DIAGNOSIS — D509 Iron deficiency anemia, unspecified: Secondary | ICD-10-CM | POA: Diagnosis not present

## 2014-11-04 DIAGNOSIS — D631 Anemia in chronic kidney disease: Secondary | ICD-10-CM | POA: Diagnosis not present

## 2014-11-04 DIAGNOSIS — N2581 Secondary hyperparathyroidism of renal origin: Secondary | ICD-10-CM | POA: Diagnosis not present

## 2014-11-04 DIAGNOSIS — N186 End stage renal disease: Secondary | ICD-10-CM | POA: Diagnosis not present

## 2014-11-04 DIAGNOSIS — Z992 Dependence on renal dialysis: Secondary | ICD-10-CM | POA: Diagnosis not present

## 2014-11-04 MED ORDER — SODIUM CHLORIDE 0.9 % IJ SOLN: 3.0000 mL | INTRAMUSCULAR | Status: DC | PRN

## 2014-11-05 ENCOUNTER — Ambulatory Visit (HOSPITAL_COMMUNITY)
Admission: RE | Admit: 2014-11-05 | Discharge: 2014-11-05 | Disposition: A | Discharge status: Home / Self Care | Payer: Medicare Other | Source: Ambulatory Visit | Attending: Vascular Surgery | Admitting: Vascular Surgery

## 2014-11-05 ENCOUNTER — Encounter (HOSPITAL_COMMUNITY)
Admission: RE | Disposition: A | Discharge status: Home / Self Care | Payer: Self-pay | Source: Ambulatory Visit | Attending: Vascular Surgery

## 2014-11-05 ENCOUNTER — Encounter (HOSPITAL_COMMUNITY): Payer: Self-pay | Admitting: Vascular Surgery

## 2014-11-05 DIAGNOSIS — T82898A Other specified complication of vascular prosthetic devices, implants and grafts, initial encounter: Secondary | ICD-10-CM | POA: Insufficient documentation

## 2014-11-05 DIAGNOSIS — N2581 Secondary hyperparathyroidism of renal origin: Secondary | ICD-10-CM | POA: Insufficient documentation

## 2014-11-05 DIAGNOSIS — Z862 Personal history of diseases of the blood and blood-forming organs and certain disorders involving the immune mechanism: Secondary | ICD-10-CM | POA: Insufficient documentation

## 2014-11-05 DIAGNOSIS — Z79899 Other long term (current) drug therapy: Secondary | ICD-10-CM | POA: Diagnosis not present

## 2014-11-05 DIAGNOSIS — N186 End stage renal disease: Secondary | ICD-10-CM | POA: Insufficient documentation

## 2014-11-05 DIAGNOSIS — I120 Hypertensive chronic kidney disease with stage 5 chronic kidney disease or end stage renal disease: Secondary | ICD-10-CM | POA: Insufficient documentation

## 2014-11-05 DIAGNOSIS — Z992 Dependence on renal dialysis: Secondary | ICD-10-CM | POA: Insufficient documentation

## 2014-11-05 DIAGNOSIS — Y929 Unspecified place or not applicable: Secondary | ICD-10-CM | POA: Insufficient documentation

## 2014-11-05 DIAGNOSIS — R9431 Abnormal electrocardiogram [ECG] [EKG]: Secondary | ICD-10-CM | POA: Diagnosis not present

## 2014-11-05 DIAGNOSIS — Y69 Unspecified misadventure during surgical and medical care: Secondary | ICD-10-CM | POA: Diagnosis not present

## 2014-11-05 HISTORY — PX: FISTULOGRAM: SHX5832

## 2014-11-05 LAB — POCT I-STAT, CHEM 8
BUN: 38 mg/dL — ABNORMAL HIGH (ref 6–23)
CALCIUM ION: 1.07 mmol/L — AB (ref 1.13–1.30)
CHLORIDE: 101 meq/L (ref 96–112)
Creatinine, Ser: 5.3 mg/dL — ABNORMAL HIGH (ref 0.50–1.10)
Glucose, Bld: 101 mg/dL — ABNORMAL HIGH (ref 70–99)
HCT: 37 % (ref 36.0–46.0)
Hemoglobin: 12.6 g/dL (ref 12.0–15.0)
POTASSIUM: 4 mmol/L (ref 3.5–5.1)
SODIUM: 140 mmol/L (ref 135–145)
TCO2: 27 mmol/L (ref 0–100)

## 2014-11-05 SURGERY — FISTULOGRAM
Anesthesia: LOCAL

## 2014-11-05 MED ORDER — SODIUM CHLORIDE 0.9 % IV SOLN: 1.0000 mL/kg/h | INTRAVENOUS | Status: DC

## 2014-11-05 MED ORDER — OXYCODONE-ACETAMINOPHEN 5-325 MG PO TABS: 1.0000 | ORAL_TABLET | ORAL | Status: DC | PRN

## 2014-11-05 MED ORDER — HEPARIN (PORCINE) IN NACL 2-0.9 UNIT/ML-% IJ SOLN
INTRAMUSCULAR | Status: AC
  Filled 2014-11-05: qty 500

## 2014-11-05 MED ORDER — LIDOCAINE HCL (PF) 1 % IJ SOLN
INTRAMUSCULAR | Status: AC
  Filled 2014-11-05: qty 30

## 2014-11-05 NOTE — Discharge Instructions (Signed)
Fistulogram, Care After °Refer to this sheet in the next few weeks. These instructions provide you with information on caring for yourself after your procedure. Your health care provider may also give you more specific instructions. Your treatment has been planned according to current medical practices, but problems sometimes occur. Call your health care provider if you have any problems or questions after your procedure. °WHAT TO EXPECT AFTER THE PROCEDURE °After your procedure, it is typical to have the following: °· A small amount of discomfort in the area where the catheters were placed. °· A small amount of bruising around the fistula. °· Sleepiness and fatigue. °HOME CARE INSTRUCTIONS °· Rest at home for the day following your procedure. °· Do not drive or operate heavy machinery while taking pain medicine. °· Take medicines only as directed by your health care provider. °· Do not take baths, swim, or use a hot tub until your health care provider approves. You may shower 24 hours after the procedure or as directed by your health care provider. °· There are many different ways to close and cover an incision, including stitches, skin glue, and adhesive strips. Follow your health care provider's instructions on: °¨ Incision care. °¨ Bandage (dressing) changes and removal. °¨ Incision closure removal. °· Monitor your dialysis fistula carefully. °SEEK MEDICAL CARE IF: °· You have drainage, redness, swelling, or pain at your catheter site. °· You have a fever. °· You have chills. °SEEK IMMEDIATE MEDICAL CARE IF: °· You feel weak. °· You have trouble balancing. °· You have trouble moving your arms or legs. °· You have problems with your speech or vision. °· You can no longer feel a vibration or buzz when you put your fingers over your dialysis fistula. °· The limb that was used for the procedure: °¨ Swells. °¨ Is painful. °¨ Is cold. °¨ Is discolored, such as blue or pale white. °Document Released: 03/01/2014  Document Reviewed: 12/04/2013 °ExitCare® Patient Information ©2015 [employer]. This information is not intended to replace advice given to you by your health care provider. Make sure you discuss any questions you have with your health care provider. ° °

## 2014-11-05 NOTE — H&P (Signed)
Patient name: Patricia Ayala MRN: [id number] DOB: 1929/04/05 Sex: female  History of present illness:   Patient is here today for evaluation of left arm AV fistula. She has a left upper arm AV fistula that was initially placed in June 2013. She had [facility] for [facility]ulogram. She did have difficulty in the past and had a angioplasty of the upper arm venous stenosis one year ago. She is concerned that the main reason for access difficulty as the quality of the access technicians.    Past Medical History  Diagnosis Date  . Anemia 02/11/2012  . Vitamin D deficiency   . Hyperparathyroidism, secondary   . H/O metabolic acidosis   . Anxiety   . Hypertension     Dr. Regino Schultze, [facility], [place]  . Chronic kidney disease     not on dialysis yet, Tues, thurs, sat    Past Surgical History  Procedure Laterality Date  . Abdominal hysterectomy    . Cholecystectomy    . Eye surgery      bilateral cataracts, /w IOL - 2013  . Av fistula placement  04/01/2012    Procedure: ARTERIOVENOUS (AV) FISTULA CREATION;  Surgeon: Sherren Kerns, MD;  Location: [facility] OR;  Service: Vascular;  Laterality: Left;  . Colon surgery      for a blockage  . Insertion of dialysis catheter  04/10/2012    Procedure: INSERTION OF DIALYSIS CATHETER;  Surgeon: Pryor Ochoa, MD;  Location: [facility] OR;  Service: Vascular;  Laterality: N/A;  Insertion Diatek Catheter Right Internal Jugular  . Ligation of competing branches of arteriovenous fistula  11/07/2012    Procedure: LIGATION OF COMPETING BRANCHES OF ARTERIOVENOUS FISTULA;  Surgeon: Chuck Hint, MD;  Location: [facility] OR;  Service: Vascular;  Laterality: Left;  Brachio/Cephalic Fistula  . Shuntogram N/A 11/03/2012    Procedure: Betsey Amen;  Surgeon: Chuck Hint, MD;  Location: [facility] CATH LAB;  Service: Cardiovascular;  Laterality: N/A;  . Shuntogram Left 03/09/2013    Procedure: Fistulogram;  Surgeon: Fransisco Hertz, MD;  Location: [facility] CATH LAB;  Service: Cardiovascular;  Laterality: Left;  . Shuntogram Left 11/23/2013    Procedure: FISTULOGRAM;  Surgeon: Fransisco Hertz, MD;  Location: [facility] CATH LAB;  Service: Cardiovascular;  Laterality: Left;    History   Social History  . Marital Status: Married    Spouse Name: N/A    Number of Children: N/A  . Years of Education: N/A   Occupational History  . Not on file.   Social History Main Topics  . Smoking status: Never Smoker   . Smokeless tobacco: Never Used  . Alcohol Use: No  . Drug Use: No  . Sexual Activity: Not on file   Other Topics Concern  . Not on file   Social History Narrative    Family History  Problem Relation Age of Onset  . Stroke Mother   . Cancer Father   . Heart attack Father     Allergies as of 11/03/2014  . (No Known Allergies)    No current facility-administered medications on file prior to encounter.   Current Outpatient Prescriptions on File Prior to Encounter  Medication Sig Dispense Refill  . ALPRAZolam (XANAX) 0.5 MG tablet Take 1 tablet by mouth every 6 (six) hours as needed. For anxiety    . amLODipine (NORVASC) 10 MG tablet Take 10 mg by mouth daily.    Marland Kitchen BIOTIN  PO Take 1,000 mg by mouth daily.     . calcitRIOL (ROCALTROL) 0.5 MCG capsule Take 0.5 mcg by mouth daily.    Marland Kitchen labetalol (NORMODYNE) 200 MG tablet Take 100 mg by mouth daily.     Marland Kitchen lidocaine-prilocaine (EMLA) cream Apply 1 application topically as needed (for IV access).     . sevelamer (RENVELA) 800 MG tablet Take 800 mg by mouth 3 (three) times daily with meals.    . simvastatin (ZOCOR) 10 MG tablet Take 10 mg by mouth at bedtime.     Marland Kitchen zolpidem (AMBIEN) 10 MG tablet Take 10 mg by mouth at bedtime as needed for sleep.         PHYSICAL EXAMINATION:  General: The patient is a well-nourished female, in no acute distress. Vital signs are BP 156/62 mmHg  Pulse 70  Temp(Src) 97.8 F (36.6 C) (Oral)  Resp 18  Ht 5\' 4"  (1.626 m)  Wt 174  lb (78.926 kg)  BMI 29.85 kg/m2  SpO2 95% Pulmonary: There is a good air exchange  Musculoskeletal: There are no major deformities.  There is no significant extremity pain. Neurologic: No focal weakness or paresthesias are detected, Skin: There are no ulcer or rashes noted. Psychiatric: The patient has normal affect. Cardiovascular: Palpable radial pulses Excellent thrill in her left upper arm AV fistula    Impression and Plan:  Recent difficulty with access from left upper arm AV fistula at the dialysis center. For shuntogram today and possible angioplasty    Leaner Morici [facility] Office: [phone]

## 2014-11-05 NOTE — Op Note (Signed)
    OPERATIVE REPORT  DATE OF SURGERY: 11/05/2014  PATIENT: Patricia Ayala, 81 y.o. female MRN: [id number]  DOB: 09-04-1929  PRE-OPERATIVE DIAGNOSIS: End-stage renal disease with recent infiltration of left upper arm AV fistula  POST-OPERATIVE DIAGNOSIS:  Same  PROCEDURE: Left upper arm AV fistula shuntogram  SURGEON:  Gretta Began, M.D.  PHYSICIAN ASSISTANT: Nurse  ANESTHESIA:  1% lidocaine local  EBL: Minimal ml     BLOOD ADMINISTERED: None  DRAINS: None  SPECIMEN: None  COUNTS CORRECT:  YES  PLAN OF CARE: PACU   PATIENT DISPOSITION:  PACU - hemodynamically stable  PROCEDURE DETAILS: The patient was taken to the perfect cath lab placed supine position with the left arm prepped and draped in sterile fashion using SonoSite ultrasound and micropuncture technique the left upper arm fistula was accessed with a needle directed towards the antecubital space. A micropuncture catheter was placed over this. Hand injections were used through this for shuntogram. The fistula was imaged from the antecubital space on the way centrally. There was no evidence of any central venous stenosis and the fistula was widely patent throughout its course. The fistula was manually compressed proximally and hand injection was used to give reflux through the arterial anastomosis. There was a change in caliber of the vein for approximately 2-3 cm from the arterial anastomosis to the first few centimeters of the vein. On comparing this to the shuntogram from one year ago there was no change. There was no critical stenosis throughout this area and this is felt to be the caliber of the vein at this location. No intervention was deemed appropriate. The micropuncture sheath was pulled after placing a 3-0 nylon pursestring around this area. This was secured and hemostasis was adequate. The patient was transferred to the holding area in stable condition   Gretta Began, M.D. 11/05/2014 11:05 AM

## 2014-11-06 DIAGNOSIS — D631 Anemia in chronic kidney disease: Secondary | ICD-10-CM | POA: Diagnosis not present

## 2014-11-06 DIAGNOSIS — N2581 Secondary hyperparathyroidism of renal origin: Secondary | ICD-10-CM | POA: Diagnosis not present

## 2014-11-06 DIAGNOSIS — Z992 Dependence on renal dialysis: Secondary | ICD-10-CM | POA: Diagnosis not present

## 2014-11-06 DIAGNOSIS — Z23 Encounter for immunization: Secondary | ICD-10-CM | POA: Diagnosis not present

## 2014-11-06 DIAGNOSIS — D509 Iron deficiency anemia, unspecified: Secondary | ICD-10-CM | POA: Diagnosis not present

## 2014-11-06 DIAGNOSIS — N186 End stage renal disease: Secondary | ICD-10-CM | POA: Diagnosis not present

## 2014-11-09 DIAGNOSIS — D509 Iron deficiency anemia, unspecified: Secondary | ICD-10-CM | POA: Diagnosis not present

## 2014-11-09 DIAGNOSIS — N2581 Secondary hyperparathyroidism of renal origin: Secondary | ICD-10-CM | POA: Diagnosis not present

## 2014-11-09 DIAGNOSIS — Z992 Dependence on renal dialysis: Secondary | ICD-10-CM | POA: Diagnosis not present

## 2014-11-09 DIAGNOSIS — N186 End stage renal disease: Secondary | ICD-10-CM | POA: Diagnosis not present

## 2014-11-09 DIAGNOSIS — Z23 Encounter for immunization: Secondary | ICD-10-CM | POA: Diagnosis not present

## 2014-11-09 DIAGNOSIS — D631 Anemia in chronic kidney disease: Secondary | ICD-10-CM | POA: Diagnosis not present

## 2014-11-13 DIAGNOSIS — N186 End stage renal disease: Secondary | ICD-10-CM | POA: Diagnosis not present

## 2014-11-13 DIAGNOSIS — N2581 Secondary hyperparathyroidism of renal origin: Secondary | ICD-10-CM | POA: Diagnosis not present

## 2014-11-13 DIAGNOSIS — Z23 Encounter for immunization: Secondary | ICD-10-CM | POA: Diagnosis not present

## 2014-11-13 DIAGNOSIS — D509 Iron deficiency anemia, unspecified: Secondary | ICD-10-CM | POA: Diagnosis not present

## 2014-11-13 DIAGNOSIS — Z992 Dependence on renal dialysis: Secondary | ICD-10-CM | POA: Diagnosis not present

## 2014-11-13 DIAGNOSIS — D631 Anemia in chronic kidney disease: Secondary | ICD-10-CM | POA: Diagnosis not present

## 2014-11-16 DIAGNOSIS — D631 Anemia in chronic kidney disease: Secondary | ICD-10-CM | POA: Diagnosis not present

## 2014-11-16 DIAGNOSIS — Z992 Dependence on renal dialysis: Secondary | ICD-10-CM | POA: Diagnosis not present

## 2014-11-16 DIAGNOSIS — N2581 Secondary hyperparathyroidism of renal origin: Secondary | ICD-10-CM | POA: Diagnosis not present

## 2014-11-16 DIAGNOSIS — N186 End stage renal disease: Secondary | ICD-10-CM | POA: Diagnosis not present

## 2014-11-16 DIAGNOSIS — Z23 Encounter for immunization: Secondary | ICD-10-CM | POA: Diagnosis not present

## 2014-11-16 DIAGNOSIS — D509 Iron deficiency anemia, unspecified: Secondary | ICD-10-CM | POA: Diagnosis not present

## 2014-11-18 DIAGNOSIS — D631 Anemia in chronic kidney disease: Secondary | ICD-10-CM | POA: Diagnosis not present

## 2014-11-18 DIAGNOSIS — Z23 Encounter for immunization: Secondary | ICD-10-CM | POA: Diagnosis not present

## 2014-11-18 DIAGNOSIS — D509 Iron deficiency anemia, unspecified: Secondary | ICD-10-CM | POA: Diagnosis not present

## 2014-11-18 DIAGNOSIS — N186 End stage renal disease: Secondary | ICD-10-CM | POA: Diagnosis not present

## 2014-11-18 DIAGNOSIS — Z992 Dependence on renal dialysis: Secondary | ICD-10-CM | POA: Diagnosis not present

## 2014-11-18 DIAGNOSIS — N2581 Secondary hyperparathyroidism of renal origin: Secondary | ICD-10-CM | POA: Diagnosis not present

## 2014-11-23 DIAGNOSIS — Z23 Encounter for immunization: Secondary | ICD-10-CM | POA: Diagnosis not present

## 2014-11-23 DIAGNOSIS — D509 Iron deficiency anemia, unspecified: Secondary | ICD-10-CM | POA: Diagnosis not present

## 2014-11-23 DIAGNOSIS — N2581 Secondary hyperparathyroidism of renal origin: Secondary | ICD-10-CM | POA: Diagnosis not present

## 2014-11-23 DIAGNOSIS — Z992 Dependence on renal dialysis: Secondary | ICD-10-CM | POA: Diagnosis not present

## 2014-11-23 DIAGNOSIS — D631 Anemia in chronic kidney disease: Secondary | ICD-10-CM | POA: Diagnosis not present

## 2014-11-23 DIAGNOSIS — N186 End stage renal disease: Secondary | ICD-10-CM | POA: Diagnosis not present

## 2014-11-25 DIAGNOSIS — D509 Iron deficiency anemia, unspecified: Secondary | ICD-10-CM | POA: Diagnosis not present

## 2014-11-25 DIAGNOSIS — N186 End stage renal disease: Secondary | ICD-10-CM | POA: Diagnosis not present

## 2014-11-25 DIAGNOSIS — D631 Anemia in chronic kidney disease: Secondary | ICD-10-CM | POA: Diagnosis not present

## 2014-11-25 DIAGNOSIS — Z992 Dependence on renal dialysis: Secondary | ICD-10-CM | POA: Diagnosis not present

## 2014-11-25 DIAGNOSIS — N2581 Secondary hyperparathyroidism of renal origin: Secondary | ICD-10-CM | POA: Diagnosis not present

## 2014-11-25 DIAGNOSIS — Z23 Encounter for immunization: Secondary | ICD-10-CM | POA: Diagnosis not present

## 2014-11-27 DIAGNOSIS — D631 Anemia in chronic kidney disease: Secondary | ICD-10-CM | POA: Diagnosis not present

## 2014-11-27 DIAGNOSIS — Z23 Encounter for immunization: Secondary | ICD-10-CM | POA: Diagnosis not present

## 2014-11-27 DIAGNOSIS — N186 End stage renal disease: Secondary | ICD-10-CM | POA: Diagnosis not present

## 2014-11-27 DIAGNOSIS — D509 Iron deficiency anemia, unspecified: Secondary | ICD-10-CM | POA: Diagnosis not present

## 2014-11-27 DIAGNOSIS — Z992 Dependence on renal dialysis: Secondary | ICD-10-CM | POA: Diagnosis not present

## 2014-11-27 DIAGNOSIS — N2581 Secondary hyperparathyroidism of renal origin: Secondary | ICD-10-CM | POA: Diagnosis not present

## 2014-11-28 DIAGNOSIS — N186 End stage renal disease: Secondary | ICD-10-CM | POA: Diagnosis not present

## 2014-11-28 DIAGNOSIS — Z992 Dependence on renal dialysis: Secondary | ICD-10-CM | POA: Diagnosis not present

## 2014-11-30 DIAGNOSIS — Z992 Dependence on renal dialysis: Secondary | ICD-10-CM | POA: Diagnosis not present

## 2014-11-30 DIAGNOSIS — D631 Anemia in chronic kidney disease: Secondary | ICD-10-CM | POA: Diagnosis not present

## 2014-11-30 DIAGNOSIS — N2581 Secondary hyperparathyroidism of renal origin: Secondary | ICD-10-CM | POA: Diagnosis not present

## 2014-11-30 DIAGNOSIS — N186 End stage renal disease: Secondary | ICD-10-CM | POA: Diagnosis not present

## 2014-12-02 DIAGNOSIS — N186 End stage renal disease: Secondary | ICD-10-CM | POA: Diagnosis not present

## 2014-12-02 DIAGNOSIS — Z992 Dependence on renal dialysis: Secondary | ICD-10-CM | POA: Diagnosis not present

## 2014-12-02 DIAGNOSIS — D631 Anemia in chronic kidney disease: Secondary | ICD-10-CM | POA: Diagnosis not present

## 2014-12-02 DIAGNOSIS — N2581 Secondary hyperparathyroidism of renal origin: Secondary | ICD-10-CM | POA: Diagnosis not present

## 2014-12-04 DIAGNOSIS — D631 Anemia in chronic kidney disease: Secondary | ICD-10-CM | POA: Diagnosis not present

## 2014-12-04 DIAGNOSIS — N186 End stage renal disease: Secondary | ICD-10-CM | POA: Diagnosis not present

## 2014-12-04 DIAGNOSIS — Z992 Dependence on renal dialysis: Secondary | ICD-10-CM | POA: Diagnosis not present

## 2014-12-04 DIAGNOSIS — N2581 Secondary hyperparathyroidism of renal origin: Secondary | ICD-10-CM | POA: Diagnosis not present

## 2014-12-07 DIAGNOSIS — N186 End stage renal disease: Secondary | ICD-10-CM | POA: Diagnosis not present

## 2014-12-07 DIAGNOSIS — D631 Anemia in chronic kidney disease: Secondary | ICD-10-CM | POA: Diagnosis not present

## 2014-12-07 DIAGNOSIS — Z992 Dependence on renal dialysis: Secondary | ICD-10-CM | POA: Diagnosis not present

## 2014-12-07 DIAGNOSIS — N2581 Secondary hyperparathyroidism of renal origin: Secondary | ICD-10-CM | POA: Diagnosis not present

## 2014-12-09 DIAGNOSIS — D631 Anemia in chronic kidney disease: Secondary | ICD-10-CM | POA: Diagnosis not present

## 2014-12-09 DIAGNOSIS — Z992 Dependence on renal dialysis: Secondary | ICD-10-CM | POA: Diagnosis not present

## 2014-12-09 DIAGNOSIS — N2581 Secondary hyperparathyroidism of renal origin: Secondary | ICD-10-CM | POA: Diagnosis not present

## 2014-12-09 DIAGNOSIS — N186 End stage renal disease: Secondary | ICD-10-CM | POA: Diagnosis not present

## 2014-12-10 DIAGNOSIS — Z6831 Body mass index (BMI) 31.0-31.9, adult: Secondary | ICD-10-CM | POA: Diagnosis not present

## 2014-12-10 DIAGNOSIS — B370 Candidal stomatitis: Secondary | ICD-10-CM | POA: Diagnosis not present

## 2014-12-11 DIAGNOSIS — D631 Anemia in chronic kidney disease: Secondary | ICD-10-CM | POA: Diagnosis not present

## 2014-12-11 DIAGNOSIS — Z992 Dependence on renal dialysis: Secondary | ICD-10-CM | POA: Diagnosis not present

## 2014-12-11 DIAGNOSIS — N2581 Secondary hyperparathyroidism of renal origin: Secondary | ICD-10-CM | POA: Diagnosis not present

## 2014-12-11 DIAGNOSIS — N186 End stage renal disease: Secondary | ICD-10-CM | POA: Diagnosis not present

## 2014-12-14 DIAGNOSIS — N186 End stage renal disease: Secondary | ICD-10-CM | POA: Diagnosis not present

## 2014-12-14 DIAGNOSIS — Z992 Dependence on renal dialysis: Secondary | ICD-10-CM | POA: Diagnosis not present

## 2014-12-14 DIAGNOSIS — N2581 Secondary hyperparathyroidism of renal origin: Secondary | ICD-10-CM | POA: Diagnosis not present

## 2014-12-14 DIAGNOSIS — D631 Anemia in chronic kidney disease: Secondary | ICD-10-CM | POA: Diagnosis not present

## 2014-12-16 DIAGNOSIS — N186 End stage renal disease: Secondary | ICD-10-CM | POA: Diagnosis not present

## 2014-12-16 DIAGNOSIS — N2581 Secondary hyperparathyroidism of renal origin: Secondary | ICD-10-CM | POA: Diagnosis not present

## 2014-12-16 DIAGNOSIS — D631 Anemia in chronic kidney disease: Secondary | ICD-10-CM | POA: Diagnosis not present

## 2014-12-16 DIAGNOSIS — Z992 Dependence on renal dialysis: Secondary | ICD-10-CM | POA: Diagnosis not present

## 2014-12-18 DIAGNOSIS — N186 End stage renal disease: Secondary | ICD-10-CM | POA: Diagnosis not present

## 2014-12-18 DIAGNOSIS — Z992 Dependence on renal dialysis: Secondary | ICD-10-CM | POA: Diagnosis not present

## 2014-12-18 DIAGNOSIS — N2581 Secondary hyperparathyroidism of renal origin: Secondary | ICD-10-CM | POA: Diagnosis not present

## 2014-12-18 DIAGNOSIS — D631 Anemia in chronic kidney disease: Secondary | ICD-10-CM | POA: Diagnosis not present

## 2014-12-21 DIAGNOSIS — D631 Anemia in chronic kidney disease: Secondary | ICD-10-CM | POA: Diagnosis not present

## 2014-12-21 DIAGNOSIS — N2581 Secondary hyperparathyroidism of renal origin: Secondary | ICD-10-CM | POA: Diagnosis not present

## 2014-12-21 DIAGNOSIS — N186 End stage renal disease: Secondary | ICD-10-CM | POA: Diagnosis not present

## 2014-12-21 DIAGNOSIS — Z992 Dependence on renal dialysis: Secondary | ICD-10-CM | POA: Diagnosis not present

## 2014-12-23 DIAGNOSIS — D631 Anemia in chronic kidney disease: Secondary | ICD-10-CM | POA: Diagnosis not present

## 2014-12-23 DIAGNOSIS — Z992 Dependence on renal dialysis: Secondary | ICD-10-CM | POA: Diagnosis not present

## 2014-12-23 DIAGNOSIS — N186 End stage renal disease: Secondary | ICD-10-CM | POA: Diagnosis not present

## 2014-12-23 DIAGNOSIS — N2581 Secondary hyperparathyroidism of renal origin: Secondary | ICD-10-CM | POA: Diagnosis not present

## 2014-12-25 DIAGNOSIS — N2581 Secondary hyperparathyroidism of renal origin: Secondary | ICD-10-CM | POA: Diagnosis not present

## 2014-12-25 DIAGNOSIS — N186 End stage renal disease: Secondary | ICD-10-CM | POA: Diagnosis not present

## 2014-12-25 DIAGNOSIS — Z992 Dependence on renal dialysis: Secondary | ICD-10-CM | POA: Diagnosis not present

## 2014-12-25 DIAGNOSIS — D631 Anemia in chronic kidney disease: Secondary | ICD-10-CM | POA: Diagnosis not present

## 2014-12-27 DIAGNOSIS — N186 End stage renal disease: Secondary | ICD-10-CM | POA: Diagnosis not present

## 2014-12-27 DIAGNOSIS — Z992 Dependence on renal dialysis: Secondary | ICD-10-CM | POA: Diagnosis not present

## 2014-12-28 DIAGNOSIS — Z992 Dependence on renal dialysis: Secondary | ICD-10-CM | POA: Diagnosis not present

## 2014-12-28 DIAGNOSIS — N186 End stage renal disease: Secondary | ICD-10-CM | POA: Diagnosis not present

## 2014-12-28 DIAGNOSIS — D631 Anemia in chronic kidney disease: Secondary | ICD-10-CM | POA: Diagnosis not present

## 2014-12-28 DIAGNOSIS — N2581 Secondary hyperparathyroidism of renal origin: Secondary | ICD-10-CM | POA: Diagnosis not present

## 2014-12-30 DIAGNOSIS — Z992 Dependence on renal dialysis: Secondary | ICD-10-CM | POA: Diagnosis not present

## 2014-12-30 DIAGNOSIS — N2581 Secondary hyperparathyroidism of renal origin: Secondary | ICD-10-CM | POA: Diagnosis not present

## 2014-12-30 DIAGNOSIS — D631 Anemia in chronic kidney disease: Secondary | ICD-10-CM | POA: Diagnosis not present

## 2014-12-30 DIAGNOSIS — N186 End stage renal disease: Secondary | ICD-10-CM | POA: Diagnosis not present

## 2015-01-01 DIAGNOSIS — N186 End stage renal disease: Secondary | ICD-10-CM | POA: Diagnosis not present

## 2015-01-01 DIAGNOSIS — Z992 Dependence on renal dialysis: Secondary | ICD-10-CM | POA: Diagnosis not present

## 2015-01-01 DIAGNOSIS — D631 Anemia in chronic kidney disease: Secondary | ICD-10-CM | POA: Diagnosis not present

## 2015-01-01 DIAGNOSIS — N2581 Secondary hyperparathyroidism of renal origin: Secondary | ICD-10-CM | POA: Diagnosis not present

## 2015-01-04 DIAGNOSIS — N186 End stage renal disease: Secondary | ICD-10-CM | POA: Diagnosis not present

## 2015-01-04 DIAGNOSIS — J040 Acute laryngitis: Secondary | ICD-10-CM | POA: Diagnosis not present

## 2015-01-04 DIAGNOSIS — Z992 Dependence on renal dialysis: Secondary | ICD-10-CM | POA: Diagnosis not present

## 2015-01-04 DIAGNOSIS — Z6831 Body mass index (BMI) 31.0-31.9, adult: Secondary | ICD-10-CM | POA: Diagnosis not present

## 2015-01-04 DIAGNOSIS — D631 Anemia in chronic kidney disease: Secondary | ICD-10-CM | POA: Diagnosis not present

## 2015-01-04 DIAGNOSIS — N2581 Secondary hyperparathyroidism of renal origin: Secondary | ICD-10-CM | POA: Diagnosis not present

## 2015-01-08 DIAGNOSIS — N2581 Secondary hyperparathyroidism of renal origin: Secondary | ICD-10-CM | POA: Diagnosis not present

## 2015-01-08 DIAGNOSIS — Z992 Dependence on renal dialysis: Secondary | ICD-10-CM | POA: Diagnosis not present

## 2015-01-08 DIAGNOSIS — N186 End stage renal disease: Secondary | ICD-10-CM | POA: Diagnosis not present

## 2015-01-08 DIAGNOSIS — D631 Anemia in chronic kidney disease: Secondary | ICD-10-CM | POA: Diagnosis not present

## 2015-01-11 DIAGNOSIS — N186 End stage renal disease: Secondary | ICD-10-CM | POA: Diagnosis not present

## 2015-01-11 DIAGNOSIS — Z992 Dependence on renal dialysis: Secondary | ICD-10-CM | POA: Diagnosis not present

## 2015-01-11 DIAGNOSIS — D631 Anemia in chronic kidney disease: Secondary | ICD-10-CM | POA: Diagnosis not present

## 2015-01-11 DIAGNOSIS — N2581 Secondary hyperparathyroidism of renal origin: Secondary | ICD-10-CM | POA: Diagnosis not present

## 2015-01-13 DIAGNOSIS — N2581 Secondary hyperparathyroidism of renal origin: Secondary | ICD-10-CM | POA: Diagnosis not present

## 2015-01-13 DIAGNOSIS — N186 End stage renal disease: Secondary | ICD-10-CM | POA: Diagnosis not present

## 2015-01-13 DIAGNOSIS — Z992 Dependence on renal dialysis: Secondary | ICD-10-CM | POA: Diagnosis not present

## 2015-01-13 DIAGNOSIS — D631 Anemia in chronic kidney disease: Secondary | ICD-10-CM | POA: Diagnosis not present

## 2015-01-15 DIAGNOSIS — Z992 Dependence on renal dialysis: Secondary | ICD-10-CM | POA: Diagnosis not present

## 2015-01-15 DIAGNOSIS — N186 End stage renal disease: Secondary | ICD-10-CM | POA: Diagnosis not present

## 2015-01-15 DIAGNOSIS — D631 Anemia in chronic kidney disease: Secondary | ICD-10-CM | POA: Diagnosis not present

## 2015-01-15 DIAGNOSIS — N2581 Secondary hyperparathyroidism of renal origin: Secondary | ICD-10-CM | POA: Diagnosis not present

## 2015-01-18 DIAGNOSIS — D631 Anemia in chronic kidney disease: Secondary | ICD-10-CM | POA: Diagnosis not present

## 2015-01-18 DIAGNOSIS — N186 End stage renal disease: Secondary | ICD-10-CM | POA: Diagnosis not present

## 2015-01-18 DIAGNOSIS — Z992 Dependence on renal dialysis: Secondary | ICD-10-CM | POA: Diagnosis not present

## 2015-01-18 DIAGNOSIS — N2581 Secondary hyperparathyroidism of renal origin: Secondary | ICD-10-CM | POA: Diagnosis not present

## 2015-01-20 DIAGNOSIS — Z992 Dependence on renal dialysis: Secondary | ICD-10-CM | POA: Diagnosis not present

## 2015-01-20 DIAGNOSIS — N2581 Secondary hyperparathyroidism of renal origin: Secondary | ICD-10-CM | POA: Diagnosis not present

## 2015-01-20 DIAGNOSIS — N186 End stage renal disease: Secondary | ICD-10-CM | POA: Diagnosis not present

## 2015-01-20 DIAGNOSIS — D631 Anemia in chronic kidney disease: Secondary | ICD-10-CM | POA: Diagnosis not present

## 2015-01-22 DIAGNOSIS — N2581 Secondary hyperparathyroidism of renal origin: Secondary | ICD-10-CM | POA: Diagnosis not present

## 2015-01-22 DIAGNOSIS — D631 Anemia in chronic kidney disease: Secondary | ICD-10-CM | POA: Diagnosis not present

## 2015-01-22 DIAGNOSIS — N186 End stage renal disease: Secondary | ICD-10-CM | POA: Diagnosis not present

## 2015-01-22 DIAGNOSIS — Z992 Dependence on renal dialysis: Secondary | ICD-10-CM | POA: Diagnosis not present

## 2015-01-25 DIAGNOSIS — N186 End stage renal disease: Secondary | ICD-10-CM | POA: Diagnosis not present

## 2015-01-25 DIAGNOSIS — N2581 Secondary hyperparathyroidism of renal origin: Secondary | ICD-10-CM | POA: Diagnosis not present

## 2015-01-25 DIAGNOSIS — D631 Anemia in chronic kidney disease: Secondary | ICD-10-CM | POA: Diagnosis not present

## 2015-01-25 DIAGNOSIS — Z992 Dependence on renal dialysis: Secondary | ICD-10-CM | POA: Diagnosis not present

## 2015-01-27 DIAGNOSIS — N186 End stage renal disease: Secondary | ICD-10-CM | POA: Diagnosis not present

## 2015-01-27 DIAGNOSIS — N2581 Secondary hyperparathyroidism of renal origin: Secondary | ICD-10-CM | POA: Diagnosis not present

## 2015-01-27 DIAGNOSIS — D631 Anemia in chronic kidney disease: Secondary | ICD-10-CM | POA: Diagnosis not present

## 2015-01-27 DIAGNOSIS — Z992 Dependence on renal dialysis: Secondary | ICD-10-CM | POA: Diagnosis not present

## 2015-01-29 DIAGNOSIS — D631 Anemia in chronic kidney disease: Secondary | ICD-10-CM | POA: Diagnosis not present

## 2015-01-29 DIAGNOSIS — N186 End stage renal disease: Secondary | ICD-10-CM | POA: Diagnosis not present

## 2015-01-29 DIAGNOSIS — N2581 Secondary hyperparathyroidism of renal origin: Secondary | ICD-10-CM | POA: Diagnosis not present

## 2015-01-29 DIAGNOSIS — Z992 Dependence on renal dialysis: Secondary | ICD-10-CM | POA: Diagnosis not present

## 2015-01-31 DIAGNOSIS — R110 Nausea: Secondary | ICD-10-CM | POA: Diagnosis not present

## 2015-01-31 DIAGNOSIS — Z6831 Body mass index (BMI) 31.0-31.9, adult: Secondary | ICD-10-CM | POA: Diagnosis not present

## 2015-01-31 DIAGNOSIS — E6609 Other obesity due to excess calories: Secondary | ICD-10-CM | POA: Diagnosis not present

## 2015-02-01 DIAGNOSIS — Z992 Dependence on renal dialysis: Secondary | ICD-10-CM | POA: Diagnosis not present

## 2015-02-01 DIAGNOSIS — N186 End stage renal disease: Secondary | ICD-10-CM | POA: Diagnosis not present

## 2015-02-01 DIAGNOSIS — D631 Anemia in chronic kidney disease: Secondary | ICD-10-CM | POA: Diagnosis not present

## 2015-02-01 DIAGNOSIS — N2581 Secondary hyperparathyroidism of renal origin: Secondary | ICD-10-CM | POA: Diagnosis not present

## 2015-02-03 DIAGNOSIS — N2581 Secondary hyperparathyroidism of renal origin: Secondary | ICD-10-CM | POA: Diagnosis not present

## 2015-02-03 DIAGNOSIS — D631 Anemia in chronic kidney disease: Secondary | ICD-10-CM | POA: Diagnosis not present

## 2015-02-03 DIAGNOSIS — Z992 Dependence on renal dialysis: Secondary | ICD-10-CM | POA: Diagnosis not present

## 2015-02-03 DIAGNOSIS — N186 End stage renal disease: Secondary | ICD-10-CM | POA: Diagnosis not present

## 2015-02-05 DIAGNOSIS — Z992 Dependence on renal dialysis: Secondary | ICD-10-CM | POA: Diagnosis not present

## 2015-02-05 DIAGNOSIS — D631 Anemia in chronic kidney disease: Secondary | ICD-10-CM | POA: Diagnosis not present

## 2015-02-05 DIAGNOSIS — N186 End stage renal disease: Secondary | ICD-10-CM | POA: Diagnosis not present

## 2015-02-05 DIAGNOSIS — N2581 Secondary hyperparathyroidism of renal origin: Secondary | ICD-10-CM | POA: Diagnosis not present

## 2015-02-10 DIAGNOSIS — Z992 Dependence on renal dialysis: Secondary | ICD-10-CM | POA: Diagnosis not present

## 2015-02-10 DIAGNOSIS — N2581 Secondary hyperparathyroidism of renal origin: Secondary | ICD-10-CM | POA: Diagnosis not present

## 2015-02-10 DIAGNOSIS — D631 Anemia in chronic kidney disease: Secondary | ICD-10-CM | POA: Diagnosis not present

## 2015-02-10 DIAGNOSIS — N186 End stage renal disease: Secondary | ICD-10-CM | POA: Diagnosis not present

## 2015-02-12 DIAGNOSIS — N2581 Secondary hyperparathyroidism of renal origin: Secondary | ICD-10-CM | POA: Diagnosis not present

## 2015-02-12 DIAGNOSIS — N186 End stage renal disease: Secondary | ICD-10-CM | POA: Diagnosis not present

## 2015-02-12 DIAGNOSIS — D631 Anemia in chronic kidney disease: Secondary | ICD-10-CM | POA: Diagnosis not present

## 2015-02-12 DIAGNOSIS — Z992 Dependence on renal dialysis: Secondary | ICD-10-CM | POA: Diagnosis not present

## 2015-02-15 DIAGNOSIS — N186 End stage renal disease: Secondary | ICD-10-CM | POA: Diagnosis not present

## 2015-02-15 DIAGNOSIS — N2581 Secondary hyperparathyroidism of renal origin: Secondary | ICD-10-CM | POA: Diagnosis not present

## 2015-02-15 DIAGNOSIS — D631 Anemia in chronic kidney disease: Secondary | ICD-10-CM | POA: Diagnosis not present

## 2015-02-15 DIAGNOSIS — Z992 Dependence on renal dialysis: Secondary | ICD-10-CM | POA: Diagnosis not present

## 2015-02-17 DIAGNOSIS — D631 Anemia in chronic kidney disease: Secondary | ICD-10-CM | POA: Diagnosis not present

## 2015-02-17 DIAGNOSIS — N186 End stage renal disease: Secondary | ICD-10-CM | POA: Diagnosis not present

## 2015-02-17 DIAGNOSIS — Z992 Dependence on renal dialysis: Secondary | ICD-10-CM | POA: Diagnosis not present

## 2015-02-17 DIAGNOSIS — N2581 Secondary hyperparathyroidism of renal origin: Secondary | ICD-10-CM | POA: Diagnosis not present

## 2015-02-19 DIAGNOSIS — D631 Anemia in chronic kidney disease: Secondary | ICD-10-CM | POA: Diagnosis not present

## 2015-02-19 DIAGNOSIS — Z992 Dependence on renal dialysis: Secondary | ICD-10-CM | POA: Diagnosis not present

## 2015-02-19 DIAGNOSIS — N186 End stage renal disease: Secondary | ICD-10-CM | POA: Diagnosis not present

## 2015-02-19 DIAGNOSIS — N2581 Secondary hyperparathyroidism of renal origin: Secondary | ICD-10-CM | POA: Diagnosis not present

## 2015-02-22 DIAGNOSIS — N2581 Secondary hyperparathyroidism of renal origin: Secondary | ICD-10-CM | POA: Diagnosis not present

## 2015-02-22 DIAGNOSIS — D631 Anemia in chronic kidney disease: Secondary | ICD-10-CM | POA: Diagnosis not present

## 2015-02-22 DIAGNOSIS — N186 End stage renal disease: Secondary | ICD-10-CM | POA: Diagnosis not present

## 2015-02-22 DIAGNOSIS — Z992 Dependence on renal dialysis: Secondary | ICD-10-CM | POA: Diagnosis not present

## 2015-02-24 DIAGNOSIS — Z992 Dependence on renal dialysis: Secondary | ICD-10-CM | POA: Diagnosis not present

## 2015-02-24 DIAGNOSIS — N186 End stage renal disease: Secondary | ICD-10-CM | POA: Diagnosis not present

## 2015-02-24 DIAGNOSIS — N2581 Secondary hyperparathyroidism of renal origin: Secondary | ICD-10-CM | POA: Diagnosis not present

## 2015-02-24 DIAGNOSIS — D631 Anemia in chronic kidney disease: Secondary | ICD-10-CM | POA: Diagnosis not present

## 2015-02-26 DIAGNOSIS — D631 Anemia in chronic kidney disease: Secondary | ICD-10-CM | POA: Diagnosis not present

## 2015-02-26 DIAGNOSIS — N2581 Secondary hyperparathyroidism of renal origin: Secondary | ICD-10-CM | POA: Diagnosis not present

## 2015-02-26 DIAGNOSIS — N186 End stage renal disease: Secondary | ICD-10-CM | POA: Diagnosis not present

## 2015-02-26 DIAGNOSIS — Z992 Dependence on renal dialysis: Secondary | ICD-10-CM | POA: Diagnosis not present

## 2015-03-01 DIAGNOSIS — Z992 Dependence on renal dialysis: Secondary | ICD-10-CM | POA: Diagnosis not present

## 2015-03-01 DIAGNOSIS — N186 End stage renal disease: Secondary | ICD-10-CM | POA: Diagnosis not present

## 2015-03-01 DIAGNOSIS — D631 Anemia in chronic kidney disease: Secondary | ICD-10-CM | POA: Diagnosis not present

## 2015-03-01 DIAGNOSIS — N2581 Secondary hyperparathyroidism of renal origin: Secondary | ICD-10-CM | POA: Diagnosis not present

## 2015-03-03 DIAGNOSIS — N2581 Secondary hyperparathyroidism of renal origin: Secondary | ICD-10-CM | POA: Diagnosis not present

## 2015-03-03 DIAGNOSIS — D631 Anemia in chronic kidney disease: Secondary | ICD-10-CM | POA: Diagnosis not present

## 2015-03-03 DIAGNOSIS — Z992 Dependence on renal dialysis: Secondary | ICD-10-CM | POA: Diagnosis not present

## 2015-03-03 DIAGNOSIS — N186 End stage renal disease: Secondary | ICD-10-CM | POA: Diagnosis not present

## 2015-03-05 DIAGNOSIS — Z992 Dependence on renal dialysis: Secondary | ICD-10-CM | POA: Diagnosis not present

## 2015-03-05 DIAGNOSIS — D631 Anemia in chronic kidney disease: Secondary | ICD-10-CM | POA: Diagnosis not present

## 2015-03-05 DIAGNOSIS — N2581 Secondary hyperparathyroidism of renal origin: Secondary | ICD-10-CM | POA: Diagnosis not present

## 2015-03-05 DIAGNOSIS — N186 End stage renal disease: Secondary | ICD-10-CM | POA: Diagnosis not present

## 2015-03-08 DIAGNOSIS — D631 Anemia in chronic kidney disease: Secondary | ICD-10-CM | POA: Diagnosis not present

## 2015-03-08 DIAGNOSIS — N2581 Secondary hyperparathyroidism of renal origin: Secondary | ICD-10-CM | POA: Diagnosis not present

## 2015-03-08 DIAGNOSIS — N186 End stage renal disease: Secondary | ICD-10-CM | POA: Diagnosis not present

## 2015-03-08 DIAGNOSIS — Z992 Dependence on renal dialysis: Secondary | ICD-10-CM | POA: Diagnosis not present

## 2015-03-10 DIAGNOSIS — N2581 Secondary hyperparathyroidism of renal origin: Secondary | ICD-10-CM | POA: Diagnosis not present

## 2015-03-10 DIAGNOSIS — D631 Anemia in chronic kidney disease: Secondary | ICD-10-CM | POA: Diagnosis not present

## 2015-03-10 DIAGNOSIS — Z992 Dependence on renal dialysis: Secondary | ICD-10-CM | POA: Diagnosis not present

## 2015-03-10 DIAGNOSIS — N186 End stage renal disease: Secondary | ICD-10-CM | POA: Diagnosis not present

## 2015-03-12 DIAGNOSIS — Z992 Dependence on renal dialysis: Secondary | ICD-10-CM | POA: Diagnosis not present

## 2015-03-12 DIAGNOSIS — N2581 Secondary hyperparathyroidism of renal origin: Secondary | ICD-10-CM | POA: Diagnosis not present

## 2015-03-12 DIAGNOSIS — D631 Anemia in chronic kidney disease: Secondary | ICD-10-CM | POA: Diagnosis not present

## 2015-03-12 DIAGNOSIS — N186 End stage renal disease: Secondary | ICD-10-CM | POA: Diagnosis not present

## 2015-03-15 DIAGNOSIS — Z992 Dependence on renal dialysis: Secondary | ICD-10-CM | POA: Diagnosis not present

## 2015-03-15 DIAGNOSIS — D631 Anemia in chronic kidney disease: Secondary | ICD-10-CM | POA: Diagnosis not present

## 2015-03-15 DIAGNOSIS — N2581 Secondary hyperparathyroidism of renal origin: Secondary | ICD-10-CM | POA: Diagnosis not present

## 2015-03-15 DIAGNOSIS — N186 End stage renal disease: Secondary | ICD-10-CM | POA: Diagnosis not present

## 2015-03-17 DIAGNOSIS — D631 Anemia in chronic kidney disease: Secondary | ICD-10-CM | POA: Diagnosis not present

## 2015-03-17 DIAGNOSIS — N2581 Secondary hyperparathyroidism of renal origin: Secondary | ICD-10-CM | POA: Diagnosis not present

## 2015-03-17 DIAGNOSIS — Z992 Dependence on renal dialysis: Secondary | ICD-10-CM | POA: Diagnosis not present

## 2015-03-17 DIAGNOSIS — N186 End stage renal disease: Secondary | ICD-10-CM | POA: Diagnosis not present

## 2015-03-19 DIAGNOSIS — N186 End stage renal disease: Secondary | ICD-10-CM | POA: Diagnosis not present

## 2015-03-19 DIAGNOSIS — N2581 Secondary hyperparathyroidism of renal origin: Secondary | ICD-10-CM | POA: Diagnosis not present

## 2015-03-19 DIAGNOSIS — D631 Anemia in chronic kidney disease: Secondary | ICD-10-CM | POA: Diagnosis not present

## 2015-03-19 DIAGNOSIS — Z992 Dependence on renal dialysis: Secondary | ICD-10-CM | POA: Diagnosis not present

## 2015-03-22 DIAGNOSIS — D631 Anemia in chronic kidney disease: Secondary | ICD-10-CM | POA: Diagnosis not present

## 2015-03-22 DIAGNOSIS — N186 End stage renal disease: Secondary | ICD-10-CM | POA: Diagnosis not present

## 2015-03-22 DIAGNOSIS — N2581 Secondary hyperparathyroidism of renal origin: Secondary | ICD-10-CM | POA: Diagnosis not present

## 2015-03-22 DIAGNOSIS — Z992 Dependence on renal dialysis: Secondary | ICD-10-CM | POA: Diagnosis not present

## 2015-03-24 DIAGNOSIS — N2581 Secondary hyperparathyroidism of renal origin: Secondary | ICD-10-CM | POA: Diagnosis not present

## 2015-03-24 DIAGNOSIS — N186 End stage renal disease: Secondary | ICD-10-CM | POA: Diagnosis not present

## 2015-03-24 DIAGNOSIS — D631 Anemia in chronic kidney disease: Secondary | ICD-10-CM | POA: Diagnosis not present

## 2015-03-24 DIAGNOSIS — Z992 Dependence on renal dialysis: Secondary | ICD-10-CM | POA: Diagnosis not present

## 2015-03-26 DIAGNOSIS — Z992 Dependence on renal dialysis: Secondary | ICD-10-CM | POA: Diagnosis not present

## 2015-03-26 DIAGNOSIS — N2581 Secondary hyperparathyroidism of renal origin: Secondary | ICD-10-CM | POA: Diagnosis not present

## 2015-03-26 DIAGNOSIS — D631 Anemia in chronic kidney disease: Secondary | ICD-10-CM | POA: Diagnosis not present

## 2015-03-26 DIAGNOSIS — N186 End stage renal disease: Secondary | ICD-10-CM | POA: Diagnosis not present

## 2015-03-29 DIAGNOSIS — N2581 Secondary hyperparathyroidism of renal origin: Secondary | ICD-10-CM | POA: Diagnosis not present

## 2015-03-29 DIAGNOSIS — N186 End stage renal disease: Secondary | ICD-10-CM | POA: Diagnosis not present

## 2015-03-29 DIAGNOSIS — Z992 Dependence on renal dialysis: Secondary | ICD-10-CM | POA: Diagnosis not present

## 2015-03-29 DIAGNOSIS — D631 Anemia in chronic kidney disease: Secondary | ICD-10-CM | POA: Diagnosis not present

## 2015-03-31 DIAGNOSIS — N2581 Secondary hyperparathyroidism of renal origin: Secondary | ICD-10-CM | POA: Diagnosis not present

## 2015-03-31 DIAGNOSIS — D631 Anemia in chronic kidney disease: Secondary | ICD-10-CM | POA: Diagnosis not present

## 2015-03-31 DIAGNOSIS — Z992 Dependence on renal dialysis: Secondary | ICD-10-CM | POA: Diagnosis not present

## 2015-03-31 DIAGNOSIS — N186 End stage renal disease: Secondary | ICD-10-CM | POA: Diagnosis not present

## 2015-04-02 DIAGNOSIS — Z992 Dependence on renal dialysis: Secondary | ICD-10-CM | POA: Diagnosis not present

## 2015-04-02 DIAGNOSIS — D631 Anemia in chronic kidney disease: Secondary | ICD-10-CM | POA: Diagnosis not present

## 2015-04-02 DIAGNOSIS — N186 End stage renal disease: Secondary | ICD-10-CM | POA: Diagnosis not present

## 2015-04-02 DIAGNOSIS — N2581 Secondary hyperparathyroidism of renal origin: Secondary | ICD-10-CM | POA: Diagnosis not present

## 2015-04-05 DIAGNOSIS — Z992 Dependence on renal dialysis: Secondary | ICD-10-CM | POA: Diagnosis not present

## 2015-04-05 DIAGNOSIS — D631 Anemia in chronic kidney disease: Secondary | ICD-10-CM | POA: Diagnosis not present

## 2015-04-05 DIAGNOSIS — N186 End stage renal disease: Secondary | ICD-10-CM | POA: Diagnosis not present

## 2015-04-05 DIAGNOSIS — N2581 Secondary hyperparathyroidism of renal origin: Secondary | ICD-10-CM | POA: Diagnosis not present

## 2015-04-07 DIAGNOSIS — Z992 Dependence on renal dialysis: Secondary | ICD-10-CM | POA: Diagnosis not present

## 2015-04-07 DIAGNOSIS — D631 Anemia in chronic kidney disease: Secondary | ICD-10-CM | POA: Diagnosis not present

## 2015-04-07 DIAGNOSIS — N2581 Secondary hyperparathyroidism of renal origin: Secondary | ICD-10-CM | POA: Diagnosis not present

## 2015-04-07 DIAGNOSIS — N186 End stage renal disease: Secondary | ICD-10-CM | POA: Diagnosis not present

## 2015-04-09 DIAGNOSIS — D631 Anemia in chronic kidney disease: Secondary | ICD-10-CM | POA: Diagnosis not present

## 2015-04-09 DIAGNOSIS — N2581 Secondary hyperparathyroidism of renal origin: Secondary | ICD-10-CM | POA: Diagnosis not present

## 2015-04-09 DIAGNOSIS — N186 End stage renal disease: Secondary | ICD-10-CM | POA: Diagnosis not present

## 2015-04-09 DIAGNOSIS — Z992 Dependence on renal dialysis: Secondary | ICD-10-CM | POA: Diagnosis not present

## 2015-04-12 DIAGNOSIS — Z992 Dependence on renal dialysis: Secondary | ICD-10-CM | POA: Diagnosis not present

## 2015-04-12 DIAGNOSIS — N2581 Secondary hyperparathyroidism of renal origin: Secondary | ICD-10-CM | POA: Diagnosis not present

## 2015-04-12 DIAGNOSIS — D631 Anemia in chronic kidney disease: Secondary | ICD-10-CM | POA: Diagnosis not present

## 2015-04-12 DIAGNOSIS — N186 End stage renal disease: Secondary | ICD-10-CM | POA: Diagnosis not present

## 2015-04-14 DIAGNOSIS — N2581 Secondary hyperparathyroidism of renal origin: Secondary | ICD-10-CM | POA: Diagnosis not present

## 2015-04-14 DIAGNOSIS — N186 End stage renal disease: Secondary | ICD-10-CM | POA: Diagnosis not present

## 2015-04-14 DIAGNOSIS — Z992 Dependence on renal dialysis: Secondary | ICD-10-CM | POA: Diagnosis not present

## 2015-04-14 DIAGNOSIS — D631 Anemia in chronic kidney disease: Secondary | ICD-10-CM | POA: Diagnosis not present

## 2015-04-16 DIAGNOSIS — D631 Anemia in chronic kidney disease: Secondary | ICD-10-CM | POA: Diagnosis not present

## 2015-04-16 DIAGNOSIS — N2581 Secondary hyperparathyroidism of renal origin: Secondary | ICD-10-CM | POA: Diagnosis not present

## 2015-04-16 DIAGNOSIS — N186 End stage renal disease: Secondary | ICD-10-CM | POA: Diagnosis not present

## 2015-04-16 DIAGNOSIS — Z992 Dependence on renal dialysis: Secondary | ICD-10-CM | POA: Diagnosis not present

## 2015-04-21 DIAGNOSIS — Z992 Dependence on renal dialysis: Secondary | ICD-10-CM | POA: Diagnosis not present

## 2015-04-21 DIAGNOSIS — N2581 Secondary hyperparathyroidism of renal origin: Secondary | ICD-10-CM | POA: Diagnosis not present

## 2015-04-21 DIAGNOSIS — D631 Anemia in chronic kidney disease: Secondary | ICD-10-CM | POA: Diagnosis not present

## 2015-04-21 DIAGNOSIS — N186 End stage renal disease: Secondary | ICD-10-CM | POA: Diagnosis not present

## 2015-04-23 DIAGNOSIS — Z992 Dependence on renal dialysis: Secondary | ICD-10-CM | POA: Diagnosis not present

## 2015-04-23 DIAGNOSIS — N2581 Secondary hyperparathyroidism of renal origin: Secondary | ICD-10-CM | POA: Diagnosis not present

## 2015-04-23 DIAGNOSIS — D631 Anemia in chronic kidney disease: Secondary | ICD-10-CM | POA: Diagnosis not present

## 2015-04-23 DIAGNOSIS — N186 End stage renal disease: Secondary | ICD-10-CM | POA: Diagnosis not present

## 2015-04-26 DIAGNOSIS — N2581 Secondary hyperparathyroidism of renal origin: Secondary | ICD-10-CM | POA: Diagnosis not present

## 2015-04-26 DIAGNOSIS — Z992 Dependence on renal dialysis: Secondary | ICD-10-CM | POA: Diagnosis not present

## 2015-04-26 DIAGNOSIS — N186 End stage renal disease: Secondary | ICD-10-CM | POA: Diagnosis not present

## 2015-04-26 DIAGNOSIS — D631 Anemia in chronic kidney disease: Secondary | ICD-10-CM | POA: Diagnosis not present

## 2015-04-28 DIAGNOSIS — N186 End stage renal disease: Secondary | ICD-10-CM | POA: Diagnosis not present

## 2015-04-28 DIAGNOSIS — Z992 Dependence on renal dialysis: Secondary | ICD-10-CM | POA: Diagnosis not present

## 2015-04-28 DIAGNOSIS — N2581 Secondary hyperparathyroidism of renal origin: Secondary | ICD-10-CM | POA: Diagnosis not present

## 2015-04-28 DIAGNOSIS — D631 Anemia in chronic kidney disease: Secondary | ICD-10-CM | POA: Diagnosis not present

## 2015-04-30 DIAGNOSIS — N186 End stage renal disease: Secondary | ICD-10-CM | POA: Diagnosis not present

## 2015-04-30 DIAGNOSIS — N2581 Secondary hyperparathyroidism of renal origin: Secondary | ICD-10-CM | POA: Diagnosis not present

## 2015-04-30 DIAGNOSIS — Z992 Dependence on renal dialysis: Secondary | ICD-10-CM | POA: Diagnosis not present

## 2015-04-30 DIAGNOSIS — D631 Anemia in chronic kidney disease: Secondary | ICD-10-CM | POA: Diagnosis not present

## 2015-05-03 DIAGNOSIS — N186 End stage renal disease: Secondary | ICD-10-CM | POA: Diagnosis not present

## 2015-05-03 DIAGNOSIS — D631 Anemia in chronic kidney disease: Secondary | ICD-10-CM | POA: Diagnosis not present

## 2015-05-03 DIAGNOSIS — N2581 Secondary hyperparathyroidism of renal origin: Secondary | ICD-10-CM | POA: Diagnosis not present

## 2015-05-03 DIAGNOSIS — Z992 Dependence on renal dialysis: Secondary | ICD-10-CM | POA: Diagnosis not present

## 2015-05-07 DIAGNOSIS — N186 End stage renal disease: Secondary | ICD-10-CM | POA: Diagnosis not present

## 2015-05-07 DIAGNOSIS — Z992 Dependence on renal dialysis: Secondary | ICD-10-CM | POA: Diagnosis not present

## 2015-05-07 DIAGNOSIS — N2581 Secondary hyperparathyroidism of renal origin: Secondary | ICD-10-CM | POA: Diagnosis not present

## 2015-05-07 DIAGNOSIS — D631 Anemia in chronic kidney disease: Secondary | ICD-10-CM | POA: Diagnosis not present

## 2015-05-10 DIAGNOSIS — N2581 Secondary hyperparathyroidism of renal origin: Secondary | ICD-10-CM | POA: Diagnosis not present

## 2015-05-10 DIAGNOSIS — Z992 Dependence on renal dialysis: Secondary | ICD-10-CM | POA: Diagnosis not present

## 2015-05-10 DIAGNOSIS — N186 End stage renal disease: Secondary | ICD-10-CM | POA: Diagnosis not present

## 2015-05-10 DIAGNOSIS — D631 Anemia in chronic kidney disease: Secondary | ICD-10-CM | POA: Diagnosis not present

## 2015-05-12 DIAGNOSIS — Z992 Dependence on renal dialysis: Secondary | ICD-10-CM | POA: Diagnosis not present

## 2015-05-12 DIAGNOSIS — D631 Anemia in chronic kidney disease: Secondary | ICD-10-CM | POA: Diagnosis not present

## 2015-05-12 DIAGNOSIS — N2581 Secondary hyperparathyroidism of renal origin: Secondary | ICD-10-CM | POA: Diagnosis not present

## 2015-05-12 DIAGNOSIS — N186 End stage renal disease: Secondary | ICD-10-CM | POA: Diagnosis not present

## 2015-05-14 DIAGNOSIS — Z992 Dependence on renal dialysis: Secondary | ICD-10-CM | POA: Diagnosis not present

## 2015-05-14 DIAGNOSIS — D631 Anemia in chronic kidney disease: Secondary | ICD-10-CM | POA: Diagnosis not present

## 2015-05-14 DIAGNOSIS — N2581 Secondary hyperparathyroidism of renal origin: Secondary | ICD-10-CM | POA: Diagnosis not present

## 2015-05-14 DIAGNOSIS — N186 End stage renal disease: Secondary | ICD-10-CM | POA: Diagnosis not present

## 2015-05-17 DIAGNOSIS — Z992 Dependence on renal dialysis: Secondary | ICD-10-CM | POA: Diagnosis not present

## 2015-05-17 DIAGNOSIS — N186 End stage renal disease: Secondary | ICD-10-CM | POA: Diagnosis not present

## 2015-05-17 DIAGNOSIS — N2581 Secondary hyperparathyroidism of renal origin: Secondary | ICD-10-CM | POA: Diagnosis not present

## 2015-05-17 DIAGNOSIS — D631 Anemia in chronic kidney disease: Secondary | ICD-10-CM | POA: Diagnosis not present

## 2015-05-19 DIAGNOSIS — N186 End stage renal disease: Secondary | ICD-10-CM | POA: Diagnosis not present

## 2015-05-19 DIAGNOSIS — N2581 Secondary hyperparathyroidism of renal origin: Secondary | ICD-10-CM | POA: Diagnosis not present

## 2015-05-19 DIAGNOSIS — D631 Anemia in chronic kidney disease: Secondary | ICD-10-CM | POA: Diagnosis not present

## 2015-05-19 DIAGNOSIS — Z992 Dependence on renal dialysis: Secondary | ICD-10-CM | POA: Diagnosis not present

## 2015-05-21 DIAGNOSIS — Z992 Dependence on renal dialysis: Secondary | ICD-10-CM | POA: Diagnosis not present

## 2015-05-21 DIAGNOSIS — D631 Anemia in chronic kidney disease: Secondary | ICD-10-CM | POA: Diagnosis not present

## 2015-05-21 DIAGNOSIS — N186 End stage renal disease: Secondary | ICD-10-CM | POA: Diagnosis not present

## 2015-05-21 DIAGNOSIS — N2581 Secondary hyperparathyroidism of renal origin: Secondary | ICD-10-CM | POA: Diagnosis not present

## 2015-05-24 DIAGNOSIS — N186 End stage renal disease: Secondary | ICD-10-CM | POA: Diagnosis not present

## 2015-05-24 DIAGNOSIS — N2581 Secondary hyperparathyroidism of renal origin: Secondary | ICD-10-CM | POA: Diagnosis not present

## 2015-05-24 DIAGNOSIS — Z992 Dependence on renal dialysis: Secondary | ICD-10-CM | POA: Diagnosis not present

## 2015-05-24 DIAGNOSIS — D631 Anemia in chronic kidney disease: Secondary | ICD-10-CM | POA: Diagnosis not present

## 2015-05-26 DIAGNOSIS — D631 Anemia in chronic kidney disease: Secondary | ICD-10-CM | POA: Diagnosis not present

## 2015-05-26 DIAGNOSIS — N2581 Secondary hyperparathyroidism of renal origin: Secondary | ICD-10-CM | POA: Diagnosis not present

## 2015-05-26 DIAGNOSIS — N186 End stage renal disease: Secondary | ICD-10-CM | POA: Diagnosis not present

## 2015-05-26 DIAGNOSIS — Z992 Dependence on renal dialysis: Secondary | ICD-10-CM | POA: Diagnosis not present

## 2015-05-28 DIAGNOSIS — D631 Anemia in chronic kidney disease: Secondary | ICD-10-CM | POA: Diagnosis not present

## 2015-05-28 DIAGNOSIS — N186 End stage renal disease: Secondary | ICD-10-CM | POA: Diagnosis not present

## 2015-05-28 DIAGNOSIS — Z992 Dependence on renal dialysis: Secondary | ICD-10-CM | POA: Diagnosis not present

## 2015-05-28 DIAGNOSIS — N2581 Secondary hyperparathyroidism of renal origin: Secondary | ICD-10-CM | POA: Diagnosis not present

## 2015-05-29 DIAGNOSIS — Z992 Dependence on renal dialysis: Secondary | ICD-10-CM | POA: Diagnosis not present

## 2015-05-29 DIAGNOSIS — N186 End stage renal disease: Secondary | ICD-10-CM | POA: Diagnosis not present

## 2015-05-31 DIAGNOSIS — N186 End stage renal disease: Secondary | ICD-10-CM | POA: Diagnosis not present

## 2015-05-31 DIAGNOSIS — D631 Anemia in chronic kidney disease: Secondary | ICD-10-CM | POA: Diagnosis not present

## 2015-05-31 DIAGNOSIS — N2581 Secondary hyperparathyroidism of renal origin: Secondary | ICD-10-CM | POA: Diagnosis not present

## 2015-05-31 DIAGNOSIS — E611 Iron deficiency: Secondary | ICD-10-CM | POA: Diagnosis not present

## 2015-06-02 DIAGNOSIS — N186 End stage renal disease: Secondary | ICD-10-CM | POA: Diagnosis not present

## 2015-06-02 DIAGNOSIS — N2581 Secondary hyperparathyroidism of renal origin: Secondary | ICD-10-CM | POA: Diagnosis not present

## 2015-06-02 DIAGNOSIS — D631 Anemia in chronic kidney disease: Secondary | ICD-10-CM | POA: Diagnosis not present

## 2015-06-02 DIAGNOSIS — E611 Iron deficiency: Secondary | ICD-10-CM | POA: Diagnosis not present

## 2015-06-04 DIAGNOSIS — D631 Anemia in chronic kidney disease: Secondary | ICD-10-CM | POA: Diagnosis not present

## 2015-06-04 DIAGNOSIS — N2581 Secondary hyperparathyroidism of renal origin: Secondary | ICD-10-CM | POA: Diagnosis not present

## 2015-06-04 DIAGNOSIS — N186 End stage renal disease: Secondary | ICD-10-CM | POA: Diagnosis not present

## 2015-06-04 DIAGNOSIS — E611 Iron deficiency: Secondary | ICD-10-CM | POA: Diagnosis not present

## 2015-06-07 DIAGNOSIS — E611 Iron deficiency: Secondary | ICD-10-CM | POA: Diagnosis not present

## 2015-06-07 DIAGNOSIS — Z992 Dependence on renal dialysis: Secondary | ICD-10-CM | POA: Diagnosis not present

## 2015-06-07 DIAGNOSIS — N2581 Secondary hyperparathyroidism of renal origin: Secondary | ICD-10-CM | POA: Diagnosis not present

## 2015-06-07 DIAGNOSIS — D631 Anemia in chronic kidney disease: Secondary | ICD-10-CM | POA: Diagnosis not present

## 2015-06-07 DIAGNOSIS — N186 End stage renal disease: Secondary | ICD-10-CM | POA: Diagnosis not present

## 2015-06-09 DIAGNOSIS — N186 End stage renal disease: Secondary | ICD-10-CM | POA: Diagnosis not present

## 2015-06-09 DIAGNOSIS — E611 Iron deficiency: Secondary | ICD-10-CM | POA: Diagnosis not present

## 2015-06-09 DIAGNOSIS — D631 Anemia in chronic kidney disease: Secondary | ICD-10-CM | POA: Diagnosis not present

## 2015-06-09 DIAGNOSIS — N2581 Secondary hyperparathyroidism of renal origin: Secondary | ICD-10-CM | POA: Diagnosis not present

## 2015-06-11 DIAGNOSIS — D631 Anemia in chronic kidney disease: Secondary | ICD-10-CM | POA: Diagnosis not present

## 2015-06-11 DIAGNOSIS — N186 End stage renal disease: Secondary | ICD-10-CM | POA: Diagnosis not present

## 2015-06-11 DIAGNOSIS — N2581 Secondary hyperparathyroidism of renal origin: Secondary | ICD-10-CM | POA: Diagnosis not present

## 2015-06-11 DIAGNOSIS — E611 Iron deficiency: Secondary | ICD-10-CM | POA: Diagnosis not present

## 2015-06-14 DIAGNOSIS — E611 Iron deficiency: Secondary | ICD-10-CM | POA: Diagnosis not present

## 2015-06-14 DIAGNOSIS — N2581 Secondary hyperparathyroidism of renal origin: Secondary | ICD-10-CM | POA: Diagnosis not present

## 2015-06-14 DIAGNOSIS — N186 End stage renal disease: Secondary | ICD-10-CM | POA: Diagnosis not present

## 2015-06-14 DIAGNOSIS — D631 Anemia in chronic kidney disease: Secondary | ICD-10-CM | POA: Diagnosis not present

## 2015-06-16 DIAGNOSIS — E611 Iron deficiency: Secondary | ICD-10-CM | POA: Diagnosis not present

## 2015-06-16 DIAGNOSIS — N2581 Secondary hyperparathyroidism of renal origin: Secondary | ICD-10-CM | POA: Diagnosis not present

## 2015-06-16 DIAGNOSIS — D631 Anemia in chronic kidney disease: Secondary | ICD-10-CM | POA: Diagnosis not present

## 2015-06-16 DIAGNOSIS — N186 End stage renal disease: Secondary | ICD-10-CM | POA: Diagnosis not present

## 2015-06-18 DIAGNOSIS — D631 Anemia in chronic kidney disease: Secondary | ICD-10-CM | POA: Diagnosis not present

## 2015-06-18 DIAGNOSIS — E611 Iron deficiency: Secondary | ICD-10-CM | POA: Diagnosis not present

## 2015-06-18 DIAGNOSIS — N186 End stage renal disease: Secondary | ICD-10-CM | POA: Diagnosis not present

## 2015-06-18 DIAGNOSIS — N2581 Secondary hyperparathyroidism of renal origin: Secondary | ICD-10-CM | POA: Diagnosis not present

## 2015-06-21 DIAGNOSIS — D631 Anemia in chronic kidney disease: Secondary | ICD-10-CM | POA: Diagnosis not present

## 2015-06-21 DIAGNOSIS — E611 Iron deficiency: Secondary | ICD-10-CM | POA: Diagnosis not present

## 2015-06-21 DIAGNOSIS — N186 End stage renal disease: Secondary | ICD-10-CM | POA: Diagnosis not present

## 2015-06-21 DIAGNOSIS — N2581 Secondary hyperparathyroidism of renal origin: Secondary | ICD-10-CM | POA: Diagnosis not present

## 2015-06-23 DIAGNOSIS — D631 Anemia in chronic kidney disease: Secondary | ICD-10-CM | POA: Diagnosis not present

## 2015-06-23 DIAGNOSIS — E611 Iron deficiency: Secondary | ICD-10-CM | POA: Diagnosis not present

## 2015-06-23 DIAGNOSIS — N2581 Secondary hyperparathyroidism of renal origin: Secondary | ICD-10-CM | POA: Diagnosis not present

## 2015-06-23 DIAGNOSIS — N186 End stage renal disease: Secondary | ICD-10-CM | POA: Diagnosis not present

## 2015-06-25 DIAGNOSIS — N186 End stage renal disease: Secondary | ICD-10-CM | POA: Diagnosis not present

## 2015-06-25 DIAGNOSIS — E611 Iron deficiency: Secondary | ICD-10-CM | POA: Diagnosis not present

## 2015-06-25 DIAGNOSIS — N2581 Secondary hyperparathyroidism of renal origin: Secondary | ICD-10-CM | POA: Diagnosis not present

## 2015-06-25 DIAGNOSIS — D631 Anemia in chronic kidney disease: Secondary | ICD-10-CM | POA: Diagnosis not present

## 2015-06-28 DIAGNOSIS — N2581 Secondary hyperparathyroidism of renal origin: Secondary | ICD-10-CM | POA: Diagnosis not present

## 2015-06-28 DIAGNOSIS — E611 Iron deficiency: Secondary | ICD-10-CM | POA: Diagnosis not present

## 2015-06-28 DIAGNOSIS — D631 Anemia in chronic kidney disease: Secondary | ICD-10-CM | POA: Diagnosis not present

## 2015-06-28 DIAGNOSIS — N186 End stage renal disease: Secondary | ICD-10-CM | POA: Diagnosis not present

## 2015-06-29 DIAGNOSIS — N186 End stage renal disease: Secondary | ICD-10-CM | POA: Diagnosis not present

## 2015-06-29 DIAGNOSIS — Z992 Dependence on renal dialysis: Secondary | ICD-10-CM | POA: Diagnosis not present

## 2015-06-30 DIAGNOSIS — N2581 Secondary hyperparathyroidism of renal origin: Secondary | ICD-10-CM | POA: Diagnosis not present

## 2015-06-30 DIAGNOSIS — N186 End stage renal disease: Secondary | ICD-10-CM | POA: Diagnosis not present

## 2015-06-30 DIAGNOSIS — D631 Anemia in chronic kidney disease: Secondary | ICD-10-CM | POA: Diagnosis not present

## 2015-06-30 DIAGNOSIS — E611 Iron deficiency: Secondary | ICD-10-CM | POA: Diagnosis not present

## 2015-07-02 DIAGNOSIS — E611 Iron deficiency: Secondary | ICD-10-CM | POA: Diagnosis not present

## 2015-07-02 DIAGNOSIS — N2581 Secondary hyperparathyroidism of renal origin: Secondary | ICD-10-CM | POA: Diagnosis not present

## 2015-07-02 DIAGNOSIS — D631 Anemia in chronic kidney disease: Secondary | ICD-10-CM | POA: Diagnosis not present

## 2015-07-02 DIAGNOSIS — N186 End stage renal disease: Secondary | ICD-10-CM | POA: Diagnosis not present

## 2015-07-05 DIAGNOSIS — N2581 Secondary hyperparathyroidism of renal origin: Secondary | ICD-10-CM | POA: Diagnosis not present

## 2015-07-05 DIAGNOSIS — N186 End stage renal disease: Secondary | ICD-10-CM | POA: Diagnosis not present

## 2015-07-05 DIAGNOSIS — E611 Iron deficiency: Secondary | ICD-10-CM | POA: Diagnosis not present

## 2015-07-05 DIAGNOSIS — D631 Anemia in chronic kidney disease: Secondary | ICD-10-CM | POA: Diagnosis not present

## 2015-07-07 DIAGNOSIS — E611 Iron deficiency: Secondary | ICD-10-CM | POA: Diagnosis not present

## 2015-07-07 DIAGNOSIS — D631 Anemia in chronic kidney disease: Secondary | ICD-10-CM | POA: Diagnosis not present

## 2015-07-07 DIAGNOSIS — N2581 Secondary hyperparathyroidism of renal origin: Secondary | ICD-10-CM | POA: Diagnosis not present

## 2015-07-07 DIAGNOSIS — N186 End stage renal disease: Secondary | ICD-10-CM | POA: Diagnosis not present

## 2015-07-09 DIAGNOSIS — D631 Anemia in chronic kidney disease: Secondary | ICD-10-CM | POA: Diagnosis not present

## 2015-07-09 DIAGNOSIS — N2581 Secondary hyperparathyroidism of renal origin: Secondary | ICD-10-CM | POA: Diagnosis not present

## 2015-07-09 DIAGNOSIS — E611 Iron deficiency: Secondary | ICD-10-CM | POA: Diagnosis not present

## 2015-07-09 DIAGNOSIS — N186 End stage renal disease: Secondary | ICD-10-CM | POA: Diagnosis not present

## 2015-07-11 DIAGNOSIS — Z01419 Encounter for gynecological examination (general) (routine) without abnormal findings: Secondary | ICD-10-CM | POA: Diagnosis not present

## 2015-07-12 DIAGNOSIS — D631 Anemia in chronic kidney disease: Secondary | ICD-10-CM | POA: Diagnosis not present

## 2015-07-12 DIAGNOSIS — N2581 Secondary hyperparathyroidism of renal origin: Secondary | ICD-10-CM | POA: Diagnosis not present

## 2015-07-12 DIAGNOSIS — Z992 Dependence on renal dialysis: Secondary | ICD-10-CM | POA: Diagnosis not present

## 2015-07-12 DIAGNOSIS — E611 Iron deficiency: Secondary | ICD-10-CM | POA: Diagnosis not present

## 2015-07-12 DIAGNOSIS — N186 End stage renal disease: Secondary | ICD-10-CM | POA: Diagnosis not present

## 2015-07-14 DIAGNOSIS — N2581 Secondary hyperparathyroidism of renal origin: Secondary | ICD-10-CM | POA: Diagnosis not present

## 2015-07-14 DIAGNOSIS — D631 Anemia in chronic kidney disease: Secondary | ICD-10-CM | POA: Diagnosis not present

## 2015-07-14 DIAGNOSIS — N186 End stage renal disease: Secondary | ICD-10-CM | POA: Diagnosis not present

## 2015-07-14 DIAGNOSIS — E611 Iron deficiency: Secondary | ICD-10-CM | POA: Diagnosis not present

## 2015-07-16 DIAGNOSIS — N2581 Secondary hyperparathyroidism of renal origin: Secondary | ICD-10-CM | POA: Diagnosis not present

## 2015-07-16 DIAGNOSIS — N186 End stage renal disease: Secondary | ICD-10-CM | POA: Diagnosis not present

## 2015-07-16 DIAGNOSIS — D631 Anemia in chronic kidney disease: Secondary | ICD-10-CM | POA: Diagnosis not present

## 2015-07-16 DIAGNOSIS — E611 Iron deficiency: Secondary | ICD-10-CM | POA: Diagnosis not present

## 2015-07-19 DIAGNOSIS — N2581 Secondary hyperparathyroidism of renal origin: Secondary | ICD-10-CM | POA: Diagnosis not present

## 2015-07-19 DIAGNOSIS — N186 End stage renal disease: Secondary | ICD-10-CM | POA: Diagnosis not present

## 2015-07-19 DIAGNOSIS — E611 Iron deficiency: Secondary | ICD-10-CM | POA: Diagnosis not present

## 2015-07-19 DIAGNOSIS — D631 Anemia in chronic kidney disease: Secondary | ICD-10-CM | POA: Diagnosis not present

## 2015-07-21 DIAGNOSIS — E611 Iron deficiency: Secondary | ICD-10-CM | POA: Diagnosis not present

## 2015-07-21 DIAGNOSIS — D631 Anemia in chronic kidney disease: Secondary | ICD-10-CM | POA: Diagnosis not present

## 2015-07-21 DIAGNOSIS — N2581 Secondary hyperparathyroidism of renal origin: Secondary | ICD-10-CM | POA: Diagnosis not present

## 2015-07-21 DIAGNOSIS — N186 End stage renal disease: Secondary | ICD-10-CM | POA: Diagnosis not present

## 2015-07-23 DIAGNOSIS — D631 Anemia in chronic kidney disease: Secondary | ICD-10-CM | POA: Diagnosis not present

## 2015-07-23 DIAGNOSIS — N186 End stage renal disease: Secondary | ICD-10-CM | POA: Diagnosis not present

## 2015-07-23 DIAGNOSIS — N2581 Secondary hyperparathyroidism of renal origin: Secondary | ICD-10-CM | POA: Diagnosis not present

## 2015-07-23 DIAGNOSIS — E611 Iron deficiency: Secondary | ICD-10-CM | POA: Diagnosis not present

## 2015-07-26 DIAGNOSIS — E611 Iron deficiency: Secondary | ICD-10-CM | POA: Diagnosis not present

## 2015-07-26 DIAGNOSIS — N186 End stage renal disease: Secondary | ICD-10-CM | POA: Diagnosis not present

## 2015-07-26 DIAGNOSIS — N2581 Secondary hyperparathyroidism of renal origin: Secondary | ICD-10-CM | POA: Diagnosis not present

## 2015-07-26 DIAGNOSIS — D631 Anemia in chronic kidney disease: Secondary | ICD-10-CM | POA: Diagnosis not present

## 2015-07-28 DIAGNOSIS — N2581 Secondary hyperparathyroidism of renal origin: Secondary | ICD-10-CM | POA: Diagnosis not present

## 2015-07-28 DIAGNOSIS — D631 Anemia in chronic kidney disease: Secondary | ICD-10-CM | POA: Diagnosis not present

## 2015-07-28 DIAGNOSIS — E611 Iron deficiency: Secondary | ICD-10-CM | POA: Diagnosis not present

## 2015-07-28 DIAGNOSIS — N186 End stage renal disease: Secondary | ICD-10-CM | POA: Diagnosis not present

## 2015-07-29 DIAGNOSIS — N186 End stage renal disease: Secondary | ICD-10-CM | POA: Diagnosis not present

## 2015-07-29 DIAGNOSIS — Z992 Dependence on renal dialysis: Secondary | ICD-10-CM | POA: Diagnosis not present

## 2015-07-30 DIAGNOSIS — D631 Anemia in chronic kidney disease: Secondary | ICD-10-CM | POA: Diagnosis not present

## 2015-07-30 DIAGNOSIS — N186 End stage renal disease: Secondary | ICD-10-CM | POA: Diagnosis not present

## 2015-07-30 DIAGNOSIS — E611 Iron deficiency: Secondary | ICD-10-CM | POA: Diagnosis not present

## 2015-07-30 DIAGNOSIS — N2581 Secondary hyperparathyroidism of renal origin: Secondary | ICD-10-CM | POA: Diagnosis not present

## 2015-08-02 DIAGNOSIS — N2581 Secondary hyperparathyroidism of renal origin: Secondary | ICD-10-CM | POA: Diagnosis not present

## 2015-08-02 DIAGNOSIS — E611 Iron deficiency: Secondary | ICD-10-CM | POA: Diagnosis not present

## 2015-08-02 DIAGNOSIS — D631 Anemia in chronic kidney disease: Secondary | ICD-10-CM | POA: Diagnosis not present

## 2015-08-02 DIAGNOSIS — N186 End stage renal disease: Secondary | ICD-10-CM | POA: Diagnosis not present

## 2015-08-04 DIAGNOSIS — D631 Anemia in chronic kidney disease: Secondary | ICD-10-CM | POA: Diagnosis not present

## 2015-08-04 DIAGNOSIS — E611 Iron deficiency: Secondary | ICD-10-CM | POA: Diagnosis not present

## 2015-08-04 DIAGNOSIS — N186 End stage renal disease: Secondary | ICD-10-CM | POA: Diagnosis not present

## 2015-08-04 DIAGNOSIS — N2581 Secondary hyperparathyroidism of renal origin: Secondary | ICD-10-CM | POA: Diagnosis not present

## 2015-08-06 DIAGNOSIS — N186 End stage renal disease: Secondary | ICD-10-CM | POA: Diagnosis not present

## 2015-08-06 DIAGNOSIS — E611 Iron deficiency: Secondary | ICD-10-CM | POA: Diagnosis not present

## 2015-08-06 DIAGNOSIS — D631 Anemia in chronic kidney disease: Secondary | ICD-10-CM | POA: Diagnosis not present

## 2015-08-06 DIAGNOSIS — N2581 Secondary hyperparathyroidism of renal origin: Secondary | ICD-10-CM | POA: Diagnosis not present

## 2015-08-09 DIAGNOSIS — Z992 Dependence on renal dialysis: Secondary | ICD-10-CM | POA: Diagnosis not present

## 2015-08-09 DIAGNOSIS — N2581 Secondary hyperparathyroidism of renal origin: Secondary | ICD-10-CM | POA: Diagnosis not present

## 2015-08-09 DIAGNOSIS — D631 Anemia in chronic kidney disease: Secondary | ICD-10-CM | POA: Diagnosis not present

## 2015-08-09 DIAGNOSIS — N186 End stage renal disease: Secondary | ICD-10-CM | POA: Diagnosis not present

## 2015-08-09 DIAGNOSIS — E611 Iron deficiency: Secondary | ICD-10-CM | POA: Diagnosis not present

## 2015-08-11 DIAGNOSIS — N186 End stage renal disease: Secondary | ICD-10-CM | POA: Diagnosis not present

## 2015-08-11 DIAGNOSIS — D631 Anemia in chronic kidney disease: Secondary | ICD-10-CM | POA: Diagnosis not present

## 2015-08-11 DIAGNOSIS — N2581 Secondary hyperparathyroidism of renal origin: Secondary | ICD-10-CM | POA: Diagnosis not present

## 2015-08-11 DIAGNOSIS — E611 Iron deficiency: Secondary | ICD-10-CM | POA: Diagnosis not present

## 2015-08-13 DIAGNOSIS — E611 Iron deficiency: Secondary | ICD-10-CM | POA: Diagnosis not present

## 2015-08-13 DIAGNOSIS — N2581 Secondary hyperparathyroidism of renal origin: Secondary | ICD-10-CM | POA: Diagnosis not present

## 2015-08-13 DIAGNOSIS — D631 Anemia in chronic kidney disease: Secondary | ICD-10-CM | POA: Diagnosis not present

## 2015-08-13 DIAGNOSIS — N186 End stage renal disease: Secondary | ICD-10-CM | POA: Diagnosis not present

## 2015-08-16 DIAGNOSIS — E611 Iron deficiency: Secondary | ICD-10-CM | POA: Diagnosis not present

## 2015-08-16 DIAGNOSIS — D631 Anemia in chronic kidney disease: Secondary | ICD-10-CM | POA: Diagnosis not present

## 2015-08-16 DIAGNOSIS — N186 End stage renal disease: Secondary | ICD-10-CM | POA: Diagnosis not present

## 2015-08-16 DIAGNOSIS — N2581 Secondary hyperparathyroidism of renal origin: Secondary | ICD-10-CM | POA: Diagnosis not present

## 2015-08-18 DIAGNOSIS — N2581 Secondary hyperparathyroidism of renal origin: Secondary | ICD-10-CM | POA: Diagnosis not present

## 2015-08-18 DIAGNOSIS — N186 End stage renal disease: Secondary | ICD-10-CM | POA: Diagnosis not present

## 2015-08-18 DIAGNOSIS — D631 Anemia in chronic kidney disease: Secondary | ICD-10-CM | POA: Diagnosis not present

## 2015-08-18 DIAGNOSIS — E611 Iron deficiency: Secondary | ICD-10-CM | POA: Diagnosis not present

## 2015-08-20 DIAGNOSIS — N2581 Secondary hyperparathyroidism of renal origin: Secondary | ICD-10-CM | POA: Diagnosis not present

## 2015-08-20 DIAGNOSIS — E611 Iron deficiency: Secondary | ICD-10-CM | POA: Diagnosis not present

## 2015-08-20 DIAGNOSIS — N186 End stage renal disease: Secondary | ICD-10-CM | POA: Diagnosis not present

## 2015-08-20 DIAGNOSIS — D631 Anemia in chronic kidney disease: Secondary | ICD-10-CM | POA: Diagnosis not present

## 2015-08-23 DIAGNOSIS — N2581 Secondary hyperparathyroidism of renal origin: Secondary | ICD-10-CM | POA: Diagnosis not present

## 2015-08-23 DIAGNOSIS — N186 End stage renal disease: Secondary | ICD-10-CM | POA: Diagnosis not present

## 2015-08-23 DIAGNOSIS — D631 Anemia in chronic kidney disease: Secondary | ICD-10-CM | POA: Diagnosis not present

## 2015-08-23 DIAGNOSIS — E611 Iron deficiency: Secondary | ICD-10-CM | POA: Diagnosis not present

## 2015-08-25 DIAGNOSIS — N2581 Secondary hyperparathyroidism of renal origin: Secondary | ICD-10-CM | POA: Diagnosis not present

## 2015-08-25 DIAGNOSIS — N186 End stage renal disease: Secondary | ICD-10-CM | POA: Diagnosis not present

## 2015-08-25 DIAGNOSIS — D631 Anemia in chronic kidney disease: Secondary | ICD-10-CM | POA: Diagnosis not present

## 2015-08-25 DIAGNOSIS — E611 Iron deficiency: Secondary | ICD-10-CM | POA: Diagnosis not present

## 2015-08-27 DIAGNOSIS — E611 Iron deficiency: Secondary | ICD-10-CM | POA: Diagnosis not present

## 2015-08-27 DIAGNOSIS — D631 Anemia in chronic kidney disease: Secondary | ICD-10-CM | POA: Diagnosis not present

## 2015-08-27 DIAGNOSIS — N2581 Secondary hyperparathyroidism of renal origin: Secondary | ICD-10-CM | POA: Diagnosis not present

## 2015-08-27 DIAGNOSIS — N186 End stage renal disease: Secondary | ICD-10-CM | POA: Diagnosis not present

## 2015-08-29 DIAGNOSIS — N186 End stage renal disease: Secondary | ICD-10-CM | POA: Diagnosis not present

## 2015-08-29 DIAGNOSIS — Z992 Dependence on renal dialysis: Secondary | ICD-10-CM | POA: Diagnosis not present

## 2015-08-30 DIAGNOSIS — N186 End stage renal disease: Secondary | ICD-10-CM | POA: Diagnosis not present

## 2015-08-30 DIAGNOSIS — D631 Anemia in chronic kidney disease: Secondary | ICD-10-CM | POA: Diagnosis not present

## 2015-08-30 DIAGNOSIS — E611 Iron deficiency: Secondary | ICD-10-CM | POA: Diagnosis not present

## 2015-08-30 DIAGNOSIS — N2581 Secondary hyperparathyroidism of renal origin: Secondary | ICD-10-CM | POA: Diagnosis not present

## 2015-09-01 DIAGNOSIS — D631 Anemia in chronic kidney disease: Secondary | ICD-10-CM | POA: Diagnosis not present

## 2015-09-01 DIAGNOSIS — N186 End stage renal disease: Secondary | ICD-10-CM | POA: Diagnosis not present

## 2015-09-01 DIAGNOSIS — N2581 Secondary hyperparathyroidism of renal origin: Secondary | ICD-10-CM | POA: Diagnosis not present

## 2015-09-01 DIAGNOSIS — E611 Iron deficiency: Secondary | ICD-10-CM | POA: Diagnosis not present

## 2015-09-03 DIAGNOSIS — N186 End stage renal disease: Secondary | ICD-10-CM | POA: Diagnosis not present

## 2015-09-03 DIAGNOSIS — D631 Anemia in chronic kidney disease: Secondary | ICD-10-CM | POA: Diagnosis not present

## 2015-09-03 DIAGNOSIS — N2581 Secondary hyperparathyroidism of renal origin: Secondary | ICD-10-CM | POA: Diagnosis not present

## 2015-09-03 DIAGNOSIS — E611 Iron deficiency: Secondary | ICD-10-CM | POA: Diagnosis not present

## 2015-09-06 DIAGNOSIS — N2581 Secondary hyperparathyroidism of renal origin: Secondary | ICD-10-CM | POA: Diagnosis not present

## 2015-09-06 DIAGNOSIS — D631 Anemia in chronic kidney disease: Secondary | ICD-10-CM | POA: Diagnosis not present

## 2015-09-06 DIAGNOSIS — N186 End stage renal disease: Secondary | ICD-10-CM | POA: Diagnosis not present

## 2015-09-06 DIAGNOSIS — E611 Iron deficiency: Secondary | ICD-10-CM | POA: Diagnosis not present

## 2015-09-08 DIAGNOSIS — D631 Anemia in chronic kidney disease: Secondary | ICD-10-CM | POA: Diagnosis not present

## 2015-09-08 DIAGNOSIS — N2581 Secondary hyperparathyroidism of renal origin: Secondary | ICD-10-CM | POA: Diagnosis not present

## 2015-09-08 DIAGNOSIS — N186 End stage renal disease: Secondary | ICD-10-CM | POA: Diagnosis not present

## 2015-09-08 DIAGNOSIS — E611 Iron deficiency: Secondary | ICD-10-CM | POA: Diagnosis not present

## 2015-09-10 DIAGNOSIS — N2581 Secondary hyperparathyroidism of renal origin: Secondary | ICD-10-CM | POA: Diagnosis not present

## 2015-09-10 DIAGNOSIS — E611 Iron deficiency: Secondary | ICD-10-CM | POA: Diagnosis not present

## 2015-09-10 DIAGNOSIS — D631 Anemia in chronic kidney disease: Secondary | ICD-10-CM | POA: Diagnosis not present

## 2015-09-10 DIAGNOSIS — N186 End stage renal disease: Secondary | ICD-10-CM | POA: Diagnosis not present

## 2015-09-13 DIAGNOSIS — E611 Iron deficiency: Secondary | ICD-10-CM | POA: Diagnosis not present

## 2015-09-13 DIAGNOSIS — D631 Anemia in chronic kidney disease: Secondary | ICD-10-CM | POA: Diagnosis not present

## 2015-09-13 DIAGNOSIS — N186 End stage renal disease: Secondary | ICD-10-CM | POA: Diagnosis not present

## 2015-09-13 DIAGNOSIS — N2581 Secondary hyperparathyroidism of renal origin: Secondary | ICD-10-CM | POA: Diagnosis not present

## 2015-09-17 DIAGNOSIS — N2581 Secondary hyperparathyroidism of renal origin: Secondary | ICD-10-CM | POA: Diagnosis not present

## 2015-09-17 DIAGNOSIS — N186 End stage renal disease: Secondary | ICD-10-CM | POA: Diagnosis not present

## 2015-09-17 DIAGNOSIS — E611 Iron deficiency: Secondary | ICD-10-CM | POA: Diagnosis not present

## 2015-09-17 DIAGNOSIS — D631 Anemia in chronic kidney disease: Secondary | ICD-10-CM | POA: Diagnosis not present

## 2015-09-20 DIAGNOSIS — N2581 Secondary hyperparathyroidism of renal origin: Secondary | ICD-10-CM | POA: Diagnosis not present

## 2015-09-20 DIAGNOSIS — D631 Anemia in chronic kidney disease: Secondary | ICD-10-CM | POA: Diagnosis not present

## 2015-09-20 DIAGNOSIS — N186 End stage renal disease: Secondary | ICD-10-CM | POA: Diagnosis not present

## 2015-09-20 DIAGNOSIS — E611 Iron deficiency: Secondary | ICD-10-CM | POA: Diagnosis not present

## 2015-09-24 ENCOUNTER — Emergency Department (HOSPITAL_COMMUNITY): Payer: Medicare Other

## 2015-09-24 ENCOUNTER — Emergency Department (HOSPITAL_COMMUNITY)
Admission: EM | Admit: 2015-09-24 | Discharge: 2015-09-24 | Disposition: A | Discharge status: Home / Self Care | Payer: Medicare Other | Attending: Emergency Medicine | Admitting: Emergency Medicine

## 2015-09-24 ENCOUNTER — Encounter (HOSPITAL_COMMUNITY): Payer: Self-pay

## 2015-09-24 DIAGNOSIS — Z79899 Other long term (current) drug therapy: Secondary | ICD-10-CM | POA: Insufficient documentation

## 2015-09-24 DIAGNOSIS — R42 Dizziness and giddiness: Secondary | ICD-10-CM | POA: Insufficient documentation

## 2015-09-24 DIAGNOSIS — Z862 Personal history of diseases of the blood and blood-forming organs and certain disorders involving the immune mechanism: Secondary | ICD-10-CM | POA: Diagnosis not present

## 2015-09-24 DIAGNOSIS — R350 Frequency of micturition: Secondary | ICD-10-CM | POA: Insufficient documentation

## 2015-09-24 DIAGNOSIS — J159 Unspecified bacterial pneumonia: Secondary | ICD-10-CM | POA: Diagnosis not present

## 2015-09-24 DIAGNOSIS — E611 Iron deficiency: Secondary | ICD-10-CM | POA: Diagnosis not present

## 2015-09-24 DIAGNOSIS — D631 Anemia in chronic kidney disease: Secondary | ICD-10-CM | POA: Diagnosis not present

## 2015-09-24 DIAGNOSIS — N186 End stage renal disease: Secondary | ICD-10-CM | POA: Diagnosis not present

## 2015-09-24 DIAGNOSIS — N2581 Secondary hyperparathyroidism of renal origin: Secondary | ICD-10-CM | POA: Diagnosis not present

## 2015-09-24 DIAGNOSIS — Z8639 Personal history of other endocrine, nutritional and metabolic disease: Secondary | ICD-10-CM | POA: Insufficient documentation

## 2015-09-24 DIAGNOSIS — Z8659 Personal history of other mental and behavioral disorders: Secondary | ICD-10-CM | POA: Diagnosis not present

## 2015-09-24 DIAGNOSIS — R05 Cough: Secondary | ICD-10-CM | POA: Diagnosis present

## 2015-09-24 DIAGNOSIS — Z992 Dependence on renal dialysis: Secondary | ICD-10-CM | POA: Insufficient documentation

## 2015-09-24 DIAGNOSIS — I120 Hypertensive chronic kidney disease with stage 5 chronic kidney disease or end stage renal disease: Secondary | ICD-10-CM | POA: Insufficient documentation

## 2015-09-24 DIAGNOSIS — R531 Weakness: Secondary | ICD-10-CM | POA: Diagnosis not present

## 2015-09-24 DIAGNOSIS — J189 Pneumonia, unspecified organism: Secondary | ICD-10-CM

## 2015-09-24 LAB — BASIC METABOLIC PANEL
ANION GAP: 9 (ref 5–15)
BUN: 27 mg/dL — ABNORMAL HIGH (ref 6–20)
CHLORIDE: 101 mmol/L (ref 101–111)
CO2: 27 mmol/L (ref 22–32)
Calcium: 9 mg/dL (ref 8.9–10.3)
Creatinine, Ser: 4.26 mg/dL — ABNORMAL HIGH (ref 0.44–1.00)
GFR calc Af Amer: 10 mL/min — ABNORMAL LOW (ref >60)
GFR, EST NON AFRICAN AMERICAN: 9 mL/min — AB (ref >60)
Glucose, Bld: 131 mg/dL — ABNORMAL HIGH (ref 65–99)
Potassium: 3.2 mmol/L — ABNORMAL LOW (ref 3.5–5.1)
SODIUM: 137 mmol/L (ref 135–145)

## 2015-09-24 LAB — URINALYSIS, ROUTINE W REFLEX MICROSCOPIC
Bilirubin Urine: NEGATIVE
Glucose, UA: 100 mg/dL — AB
Ketones, ur: NEGATIVE mg/dL
LEUKOCYTES UA: NEGATIVE
Nitrite: NEGATIVE
PROTEIN: 100 mg/dL — AB
SPECIFIC GRAVITY, URINE: 1.01 (ref 1.005–1.030)
pH: 8 (ref 5.0–8.0)

## 2015-09-24 LAB — CBC WITH DIFFERENTIAL/PLATELET
BASOS ABS: 0 10*3/uL (ref 0.0–0.1)
Basophils Relative: 0 %
EOS ABS: 0.1 10*3/uL (ref 0.0–0.7)
EOS PCT: 1 %
HCT: 34.4 % — ABNORMAL LOW (ref 36.0–46.0)
HEMOGLOBIN: 10.9 g/dL — AB (ref 12.0–15.0)
LYMPHS ABS: 1.2 10*3/uL (ref 0.7–4.0)
LYMPHS PCT: 13 %
MCH: 29 pg (ref 26.0–34.0)
MCHC: 31.7 g/dL (ref 30.0–36.0)
MCV: 91.5 fL (ref 78.0–100.0)
Monocytes Absolute: 1.2 10*3/uL — ABNORMAL HIGH (ref 0.1–1.0)
Monocytes Relative: 12 %
NEUTROS PCT: 74 %
Neutro Abs: 7.3 10*3/uL (ref 1.7–7.7)
PLATELETS: 250 10*3/uL (ref 150–400)
RBC: 3.76 MIL/uL — AB (ref 3.87–5.11)
RDW: 14.3 % (ref 11.5–15.5)
WBC: 9.8 10*3/uL (ref 4.0–10.5)

## 2015-09-24 LAB — URINE MICROSCOPIC-ADD ON
Bacteria, UA: NONE SEEN
SQUAMOUS EPITHELIAL / LPF: NONE SEEN
WBC UA: NONE SEEN WBC/hpf (ref 0–5)

## 2015-09-24 LAB — TROPONIN I: TROPONIN I: 0.04 ng/mL — AB (ref <0.031)

## 2015-09-24 MED ORDER — LEVOFLOXACIN 500 MG PO TABS: 500.0000 mg | ORAL_TABLET | ORAL | Status: AC

## 2015-09-24 MED ORDER — LEVOFLOXACIN 750 MG PO TABS
750.0000 mg | ORAL_TABLET | Freq: Once | ORAL | Status: AC
  Administered 2015-09-24: 750 mg via ORAL
  Filled 2015-09-24: qty 1

## 2015-09-24 NOTE — ED Notes (Signed)
Pt made aware to return if symptoms worsen or if any life threatening symptoms occur.   

## 2015-09-24 NOTE — Discharge Instructions (Signed)

## 2015-09-24 NOTE — ED Notes (Signed)
Patient just left HD feeling weak and generalized body aches. Began about a week ago. Husband had flu last week.

## 2015-09-24 NOTE — ED Provider Notes (Signed)
CSN: [id number]     Arrival date & time 09/24/15  1054 History  By signing my name below, I, Ronney Lion, attest that this documentation has been prepared under the direction and in the presence of Linwood Dibbles, MD. Electronically Signed: Ronney Lion, ED Scribe. 09/24/2015. 2:47 PM.     Chief Complaint  Patient presents with  . Fatigue   The history is provided by the patient. No language interpreter was used.    HPI Comments: Patricia Ayala is a 81 y.o. female with a history of CKD (dialyzed [facility]Th[facility]S) who presents to the Emergency Department with multiple complaints, including fatigue. She also complains of a headache, dizziness, chills, back pain, and cough. She was told she had a fever while in dialysis this morning, but she does not know her temperature. She complains of urinary frequency and reports she urinates about 3-4 times a day, which is more than her usual 2 times a day. She notes resolved SOB this morning that was improved after she was done with dialysis. Patient has had sick contact with her husband, who had the flu last week. However, patient states she had a flu vaccine this year.   Past Medical History  Diagnosis Date  . Anemia 02/11/2012  . Vitamin D deficiency   . Hyperparathyroidism, secondary (HCC)   . H/O metabolic acidosis   . Anxiety   . Hypertension     Dr. Regino Schultze, [facility], [place]  . Chronic kidney disease     not on dialysis yet, Tues, thurs, sat   Past Surgical History  Procedure Laterality Date  . Abdominal hysterectomy    . Cholecystectomy    . Eye surgery      bilateral cataracts, /w IOL - 2013  . Av fistula placement  04/01/2012    Procedure: ARTERIOVENOUS (AV) FISTULA CREATION;  Surgeon: Sherren Kerns, MD;  Location: [facility] OR;  Service: Vascular;  Laterality: Left;  . Colon surgery      for a blockage  . Insertion of dialysis catheter  04/10/2012    Procedure: INSERTION OF DIALYSIS CATHETER;  Surgeon: Pryor Ochoa, MD;  Location: [facility] OR;  Service: Vascular;   Laterality: N/A;  Insertion Diatek Catheter Right Internal Jugular  . Ligation of competing branches of arteriovenous fistula  11/07/2012    Procedure: LIGATION OF COMPETING BRANCHES OF ARTERIOVENOUS FISTULA;  Surgeon: Chuck Hint, MD;  Location: [facility] OR;  Service: Vascular;  Laterality: Left;  Brachio/Cephalic Fistula  . Shuntogram N/A 11/03/2012    Procedure: Betsey Amen;  Surgeon: Chuck Hint, MD;  Location: [facility] CATH LAB;  Service: Cardiovascular;  Laterality: N/A;  . Shuntogram Left 03/09/2013    Procedure: Fistulogram;  Surgeon: Fransisco Hertz, MD;  Location: [facility] CATH LAB;  Service: Cardiovascular;  Laterality: Left;  . Shuntogram Left 11/23/2013    Procedure: FISTULOGRAM;  Surgeon: Fransisco Hertz, MD;  Location: [facility] CATH LAB;  Service: Cardiovascular;  Laterality: Left;  . Fistulogram N/A 11/05/2014    Procedure: FISTULOGRAM;  Surgeon: Larina Earthly, MD;  Location: [facility] CATH LAB;  Service: Cardiovascular;  Laterality: N/A;   Family History  Problem Relation Age of Onset  . Stroke Mother   . Cancer Father   . Heart attack Father    Social History  Substance Use Topics  . Smoking status: Never Smoker   . Smokeless tobacco: Never Used  . Alcohol Use: No   OB History    No data available     Review of Systems A  complete 10 system review of systems was obtained and all systems are negative except as noted in the HPI and PMH.    Allergies  Review of patient's allergies indicates no known allergies.  Home Medications   Prior to Admission medications   Medication Sig Start Date End Date Taking? Authorizing Provider  acetaminophen (TYLENOL) 500 MG tablet Take 1,000 mg by mouth every 6 (six) hours as needed for moderate pain.   Yes Historical Provider, MD  amLODipine (NORVASC) 5 MG tablet Take 5 mg by mouth daily.   Yes Historical Provider, MD  BIOTIN PO Take 1,000 mg by mouth daily.    Yes Historical Provider, MD  labetalol (NORMODYNE) 200 MG tablet Take 200 mg by mouth 2 (two)  times daily.    Yes Historical Provider, MD  lidocaine-prilocaine (EMLA) cream Apply 1 application topically as needed (for IV access).  01/12/13  Yes Historical Provider, MD  multivitamin (RENA-VIT) TABS tablet Take 1 tablet by mouth daily.   Yes Historical Provider, MD  promethazine (PHENERGAN) 25 MG tablet Take 25 mg by mouth every 6 (six) hours as needed for nausea or vomiting.   Yes Historical Provider, MD  simvastatin (ZOCOR) 10 MG tablet Take 10 mg by mouth at bedtime.  02/06/13  Yes Historical Provider, MD  zolpidem (AMBIEN) 10 MG tablet Take 10 mg by mouth at bedtime as needed for sleep.  11/10/13  Yes Historical Provider, MD  levofloxacin (LEVAQUIN) 500 MG tablet Take 1 tablet (500 mg total) by mouth every other day. First dose on  11/28 09/26/15 10/01/15  Linwood Dibbles, MD   BP 188/96 mmHg  Pulse 70  Temp(Src) 98 F (36.7 C) (Oral)  Resp 18  Ht 5\' 4"  (1.626 m)  Wt 78.019 kg  BMI 29.51 kg/m2  SpO2 95% Physical Exam  Constitutional: She appears well-developed and well-nourished. No distress.  HENT:  Head: Normocephalic and atraumatic.  Right Ear: External ear normal.  Left Ear: External ear normal.  Eyes: Conjunctivae are normal. Right eye exhibits no discharge. Left eye exhibits no discharge. No scleral icterus.  Neck: Neck supple. No tracheal deviation present.  Cardiovascular: Normal rate, regular rhythm and intact distal pulses.   Pulmonary/Chest: Effort normal and breath sounds normal. No stridor. No respiratory distress. She has no wheezes. She has no rales.  Lungs are clear to auscultation.   Abdominal: Soft. Bowel sounds are normal. She exhibits no distension. There is no tenderness. There is no rebound and no guarding.  Musculoskeletal: She exhibits no edema or tenderness.  Neurological: She is alert. She has normal strength. No cranial nerve deficit (no facial droop, extraocular movements intact, no slurred speech) or sensory deficit. She exhibits normal muscle tone. She  displays no seizure activity. Coordination normal.  Skin: Skin is warm and dry. No rash noted.  Psychiatric: She has a normal mood and affect.  Nursing note and vitals reviewed.   ED Course  Procedures (including critical care time)  DIAGNOSTIC STUDIES: Oxygen Saturation is 99% on RA, normal by my interpretation.    COORDINATION OF CARE: 2:47 PM - Discussed treatment plan with pt at bedside which includes diagnostic testing. Pt verbalized understanding and agreed to plan.   Labs Review Labs Reviewed  CBC WITH DIFFERENTIAL/PLATELET - Abnormal; Notable for the following:    RBC 3.76 (*)    Hemoglobin 10.9 (*)    HCT 34.4 (*)    Monocytes Absolute 1.2 (*)    All other components within normal limits  BASIC METABOLIC PANEL -  Abnormal; Notable for the following:    Potassium 3.2 (*)    Glucose, Bld 131 (*)    BUN 27 (*)    Creatinine, Ser 4.26 (*)    GFR calc non Af Amer 9 (*)    GFR calc Af Amer 10 (*)    All other components within normal limits  URINALYSIS, ROUTINE W REFLEX MICROSCOPIC (NOT AT [facility]) - Abnormal; Notable for the following:    Glucose, UA 100 (*)    Hgb urine dipstick MODERATE (*)    Protein, ur 100 (*)    All other components within normal limits  TROPONIN I - Abnormal; Notable for the following:    Troponin I 0.04 (*)    All other components within normal limits  URINE MICROSCOPIC-ADD ON    Imaging Review Dg Chest 2 View  09/24/2015  CLINICAL DATA:  Generalized body aches and weakness. EXAM: CHEST  2 VIEW COMPARISON:  04/01/2012 FINDINGS: Lungs are adequately inflated with subtle prominence of the perihilar bronchovascular markings. Mild left base opacification compatible with small effusion and possible associated atelectasis versus infection in the left base. Moderate cardiomegaly. Calcified plaque over the thoracoabdominal aorta. There are degenerative changes of the spine. IMPRESSION: Left base opacification compatible with small effusion with associated  atelectasis versus infection. Cardiomegaly with mild prominence of the central bronchovascular markings which may be due to mild vascular congestion or acute bronchitic process. Electronically Signed   By: Elberta Fortis M.D.   On: 09/24/2015 13:21   I have personally reviewed and evaluated these images and lab results as part of my medical decision-making.   EKG Interpretation   Date/Time:  Saturday September 24 2015 12:42:26 EST Ventricular Rate:  73 PR Interval:  167 QRS Duration: 90 QT Interval:  448 QTC Calculation: 494 R Axis:   16 Text Interpretation:  Sinus rhythm Anteroseptal infarct, age indeterminate  Lateral leads are also involved Baseline wander in lead(s) V3 No  significant change since last tracing Confirmed by Gunther Zawadzki  MD-J, Rashaunda Rahl  ([id number]) on 09/24/2015 12:53:31 PM      MDM   Final diagnoses:  HCAP (healthcare-associated pneumonia)   The patient's chest x-ray suggests the possibility of pneumonia versus atelectasis and small effusion. Patient admits she has been coughing recently.  Labs are consistent with her chronic renal failure.  Slight increase in troponin but I think this is related to her chronic renal insufficiency and not an acute cardiac event.   She is afebrile, oxygenating well and appears nontoxic. I will discharge her home on a course of oral antibiotics.  I consulted with the pharmacist and we'll give her Levaquin today 750 mg. Follow-up with 500 mg every 48 hours dosing.   I personally performed the services described in this documentation, which was scribed in my presence.  The recorded information has been reviewed and is accurate.      Linwood Dibbles, MD 09/24/15 [phone]

## 2015-09-24 NOTE — ED Notes (Signed)
Pt ambulated to bathroom for urine sample.

## 2015-09-27 DIAGNOSIS — N2581 Secondary hyperparathyroidism of renal origin: Secondary | ICD-10-CM | POA: Diagnosis not present

## 2015-09-27 DIAGNOSIS — E611 Iron deficiency: Secondary | ICD-10-CM | POA: Diagnosis not present

## 2015-09-27 DIAGNOSIS — N186 End stage renal disease: Secondary | ICD-10-CM | POA: Diagnosis not present

## 2015-09-27 DIAGNOSIS — D631 Anemia in chronic kidney disease: Secondary | ICD-10-CM | POA: Diagnosis not present

## 2015-09-28 DIAGNOSIS — N186 End stage renal disease: Secondary | ICD-10-CM | POA: Diagnosis not present

## 2015-09-28 DIAGNOSIS — Z992 Dependence on renal dialysis: Secondary | ICD-10-CM | POA: Diagnosis not present

## 2015-09-29 DIAGNOSIS — N186 End stage renal disease: Secondary | ICD-10-CM | POA: Diagnosis not present

## 2015-09-29 DIAGNOSIS — N2581 Secondary hyperparathyroidism of renal origin: Secondary | ICD-10-CM | POA: Diagnosis not present

## 2015-09-29 DIAGNOSIS — D631 Anemia in chronic kidney disease: Secondary | ICD-10-CM | POA: Diagnosis not present

## 2015-09-29 DIAGNOSIS — E611 Iron deficiency: Secondary | ICD-10-CM | POA: Diagnosis not present

## 2015-10-01 DIAGNOSIS — N2581 Secondary hyperparathyroidism of renal origin: Secondary | ICD-10-CM | POA: Diagnosis not present

## 2015-10-01 DIAGNOSIS — E611 Iron deficiency: Secondary | ICD-10-CM | POA: Diagnosis not present

## 2015-10-01 DIAGNOSIS — D631 Anemia in chronic kidney disease: Secondary | ICD-10-CM | POA: Diagnosis not present

## 2015-10-01 DIAGNOSIS — N186 End stage renal disease: Secondary | ICD-10-CM | POA: Diagnosis not present

## 2015-10-04 DIAGNOSIS — D631 Anemia in chronic kidney disease: Secondary | ICD-10-CM | POA: Diagnosis not present

## 2015-10-04 DIAGNOSIS — N186 End stage renal disease: Secondary | ICD-10-CM | POA: Diagnosis not present

## 2015-10-04 DIAGNOSIS — E611 Iron deficiency: Secondary | ICD-10-CM | POA: Diagnosis not present

## 2015-10-04 DIAGNOSIS — N2581 Secondary hyperparathyroidism of renal origin: Secondary | ICD-10-CM | POA: Diagnosis not present

## 2015-10-06 DIAGNOSIS — N186 End stage renal disease: Secondary | ICD-10-CM | POA: Diagnosis not present

## 2015-10-06 DIAGNOSIS — D631 Anemia in chronic kidney disease: Secondary | ICD-10-CM | POA: Diagnosis not present

## 2015-10-06 DIAGNOSIS — N2581 Secondary hyperparathyroidism of renal origin: Secondary | ICD-10-CM | POA: Diagnosis not present

## 2015-10-06 DIAGNOSIS — E611 Iron deficiency: Secondary | ICD-10-CM | POA: Diagnosis not present

## 2015-10-08 DIAGNOSIS — N2581 Secondary hyperparathyroidism of renal origin: Secondary | ICD-10-CM | POA: Diagnosis not present

## 2015-10-08 DIAGNOSIS — D631 Anemia in chronic kidney disease: Secondary | ICD-10-CM | POA: Diagnosis not present

## 2015-10-08 DIAGNOSIS — E611 Iron deficiency: Secondary | ICD-10-CM | POA: Diagnosis not present

## 2015-10-08 DIAGNOSIS — N186 End stage renal disease: Secondary | ICD-10-CM | POA: Diagnosis not present

## 2015-10-11 DIAGNOSIS — N2581 Secondary hyperparathyroidism of renal origin: Secondary | ICD-10-CM | POA: Diagnosis not present

## 2015-10-11 DIAGNOSIS — N186 End stage renal disease: Secondary | ICD-10-CM | POA: Diagnosis not present

## 2015-10-11 DIAGNOSIS — E611 Iron deficiency: Secondary | ICD-10-CM | POA: Diagnosis not present

## 2015-10-11 DIAGNOSIS — D631 Anemia in chronic kidney disease: Secondary | ICD-10-CM | POA: Diagnosis not present

## 2015-10-11 DIAGNOSIS — Z992 Dependence on renal dialysis: Secondary | ICD-10-CM | POA: Diagnosis not present

## 2015-10-13 DIAGNOSIS — N186 End stage renal disease: Secondary | ICD-10-CM | POA: Diagnosis not present

## 2015-10-13 DIAGNOSIS — E611 Iron deficiency: Secondary | ICD-10-CM | POA: Diagnosis not present

## 2015-10-13 DIAGNOSIS — D631 Anemia in chronic kidney disease: Secondary | ICD-10-CM | POA: Diagnosis not present

## 2015-10-13 DIAGNOSIS — N2581 Secondary hyperparathyroidism of renal origin: Secondary | ICD-10-CM | POA: Diagnosis not present

## 2015-10-15 DIAGNOSIS — N186 End stage renal disease: Secondary | ICD-10-CM | POA: Diagnosis not present

## 2015-10-15 DIAGNOSIS — N2581 Secondary hyperparathyroidism of renal origin: Secondary | ICD-10-CM | POA: Diagnosis not present

## 2015-10-15 DIAGNOSIS — D631 Anemia in chronic kidney disease: Secondary | ICD-10-CM | POA: Diagnosis not present

## 2015-10-15 DIAGNOSIS — E611 Iron deficiency: Secondary | ICD-10-CM | POA: Diagnosis not present

## 2015-10-18 DIAGNOSIS — E611 Iron deficiency: Secondary | ICD-10-CM | POA: Diagnosis not present

## 2015-10-18 DIAGNOSIS — N2581 Secondary hyperparathyroidism of renal origin: Secondary | ICD-10-CM | POA: Diagnosis not present

## 2015-10-18 DIAGNOSIS — D631 Anemia in chronic kidney disease: Secondary | ICD-10-CM | POA: Diagnosis not present

## 2015-10-18 DIAGNOSIS — N186 End stage renal disease: Secondary | ICD-10-CM | POA: Diagnosis not present

## 2015-10-20 DIAGNOSIS — N2581 Secondary hyperparathyroidism of renal origin: Secondary | ICD-10-CM | POA: Diagnosis not present

## 2015-10-20 DIAGNOSIS — E611 Iron deficiency: Secondary | ICD-10-CM | POA: Diagnosis not present

## 2015-10-20 DIAGNOSIS — D631 Anemia in chronic kidney disease: Secondary | ICD-10-CM | POA: Diagnosis not present

## 2015-10-20 DIAGNOSIS — N186 End stage renal disease: Secondary | ICD-10-CM | POA: Diagnosis not present

## 2015-10-22 DIAGNOSIS — E611 Iron deficiency: Secondary | ICD-10-CM | POA: Diagnosis not present

## 2015-10-22 DIAGNOSIS — D631 Anemia in chronic kidney disease: Secondary | ICD-10-CM | POA: Diagnosis not present

## 2015-10-22 DIAGNOSIS — N186 End stage renal disease: Secondary | ICD-10-CM | POA: Diagnosis not present

## 2015-10-22 DIAGNOSIS — N2581 Secondary hyperparathyroidism of renal origin: Secondary | ICD-10-CM | POA: Diagnosis not present

## 2015-10-25 DIAGNOSIS — E611 Iron deficiency: Secondary | ICD-10-CM | POA: Diagnosis not present

## 2015-10-25 DIAGNOSIS — N186 End stage renal disease: Secondary | ICD-10-CM | POA: Diagnosis not present

## 2015-10-25 DIAGNOSIS — D631 Anemia in chronic kidney disease: Secondary | ICD-10-CM | POA: Diagnosis not present

## 2015-10-25 DIAGNOSIS — N2581 Secondary hyperparathyroidism of renal origin: Secondary | ICD-10-CM | POA: Diagnosis not present

## 2015-10-27 DIAGNOSIS — E611 Iron deficiency: Secondary | ICD-10-CM | POA: Diagnosis not present

## 2015-10-27 DIAGNOSIS — N186 End stage renal disease: Secondary | ICD-10-CM | POA: Diagnosis not present

## 2015-10-27 DIAGNOSIS — D631 Anemia in chronic kidney disease: Secondary | ICD-10-CM | POA: Diagnosis not present

## 2015-10-27 DIAGNOSIS — N2581 Secondary hyperparathyroidism of renal origin: Secondary | ICD-10-CM | POA: Diagnosis not present

## 2015-10-29 DIAGNOSIS — N2581 Secondary hyperparathyroidism of renal origin: Secondary | ICD-10-CM | POA: Diagnosis not present

## 2015-10-29 DIAGNOSIS — E611 Iron deficiency: Secondary | ICD-10-CM | POA: Diagnosis not present

## 2015-10-29 DIAGNOSIS — N186 End stage renal disease: Secondary | ICD-10-CM | POA: Diagnosis not present

## 2015-10-29 DIAGNOSIS — Z992 Dependence on renal dialysis: Secondary | ICD-10-CM | POA: Diagnosis not present

## 2015-10-29 DIAGNOSIS — D631 Anemia in chronic kidney disease: Secondary | ICD-10-CM | POA: Diagnosis not present

## 2015-11-01 DIAGNOSIS — N186 End stage renal disease: Secondary | ICD-10-CM | POA: Diagnosis not present

## 2015-11-01 DIAGNOSIS — Z992 Dependence on renal dialysis: Secondary | ICD-10-CM | POA: Diagnosis not present

## 2015-11-03 DIAGNOSIS — N186 End stage renal disease: Secondary | ICD-10-CM | POA: Diagnosis not present

## 2015-11-03 DIAGNOSIS — Z992 Dependence on renal dialysis: Secondary | ICD-10-CM | POA: Diagnosis not present

## 2015-11-08 DIAGNOSIS — N186 End stage renal disease: Secondary | ICD-10-CM | POA: Diagnosis not present

## 2015-11-08 DIAGNOSIS — Z992 Dependence on renal dialysis: Secondary | ICD-10-CM | POA: Diagnosis not present

## 2015-11-10 DIAGNOSIS — N186 End stage renal disease: Secondary | ICD-10-CM | POA: Diagnosis not present

## 2015-11-10 DIAGNOSIS — Z992 Dependence on renal dialysis: Secondary | ICD-10-CM | POA: Diagnosis not present

## 2015-11-12 DIAGNOSIS — Z992 Dependence on renal dialysis: Secondary | ICD-10-CM | POA: Diagnosis not present

## 2015-11-12 DIAGNOSIS — N186 End stage renal disease: Secondary | ICD-10-CM | POA: Diagnosis not present

## 2015-11-15 DIAGNOSIS — N186 End stage renal disease: Secondary | ICD-10-CM | POA: Diagnosis not present

## 2015-11-15 DIAGNOSIS — Z992 Dependence on renal dialysis: Secondary | ICD-10-CM | POA: Diagnosis not present

## 2015-11-17 DIAGNOSIS — N186 End stage renal disease: Secondary | ICD-10-CM | POA: Diagnosis not present

## 2015-11-17 DIAGNOSIS — Z992 Dependence on renal dialysis: Secondary | ICD-10-CM | POA: Diagnosis not present

## 2015-11-19 DIAGNOSIS — Z992 Dependence on renal dialysis: Secondary | ICD-10-CM | POA: Diagnosis not present

## 2015-11-19 DIAGNOSIS — N186 End stage renal disease: Secondary | ICD-10-CM | POA: Diagnosis not present

## 2015-11-22 DIAGNOSIS — Z992 Dependence on renal dialysis: Secondary | ICD-10-CM | POA: Diagnosis not present

## 2015-11-22 DIAGNOSIS — N186 End stage renal disease: Secondary | ICD-10-CM | POA: Diagnosis not present

## 2015-11-24 DIAGNOSIS — Z992 Dependence on renal dialysis: Secondary | ICD-10-CM | POA: Diagnosis not present

## 2015-11-24 DIAGNOSIS — N186 End stage renal disease: Secondary | ICD-10-CM | POA: Diagnosis not present

## 2015-11-26 DIAGNOSIS — Z992 Dependence on renal dialysis: Secondary | ICD-10-CM | POA: Diagnosis not present

## 2015-11-26 DIAGNOSIS — N186 End stage renal disease: Secondary | ICD-10-CM | POA: Diagnosis not present

## 2015-11-29 DIAGNOSIS — N186 End stage renal disease: Secondary | ICD-10-CM | POA: Diagnosis not present

## 2015-11-29 DIAGNOSIS — Z992 Dependence on renal dialysis: Secondary | ICD-10-CM | POA: Diagnosis not present

## 2015-12-01 DIAGNOSIS — N186 End stage renal disease: Secondary | ICD-10-CM | POA: Diagnosis not present

## 2015-12-01 DIAGNOSIS — Z992 Dependence on renal dialysis: Secondary | ICD-10-CM | POA: Diagnosis not present

## 2015-12-03 DIAGNOSIS — N186 End stage renal disease: Secondary | ICD-10-CM | POA: Diagnosis not present

## 2015-12-03 DIAGNOSIS — Z992 Dependence on renal dialysis: Secondary | ICD-10-CM | POA: Diagnosis not present

## 2015-12-06 DIAGNOSIS — N186 End stage renal disease: Secondary | ICD-10-CM | POA: Diagnosis not present

## 2015-12-06 DIAGNOSIS — Z992 Dependence on renal dialysis: Secondary | ICD-10-CM | POA: Diagnosis not present

## 2015-12-08 DIAGNOSIS — N186 End stage renal disease: Secondary | ICD-10-CM | POA: Diagnosis not present

## 2015-12-08 DIAGNOSIS — Z992 Dependence on renal dialysis: Secondary | ICD-10-CM | POA: Diagnosis not present

## 2015-12-09 DIAGNOSIS — J019 Acute sinusitis, unspecified: Secondary | ICD-10-CM | POA: Diagnosis not present

## 2015-12-09 DIAGNOSIS — E782 Mixed hyperlipidemia: Secondary | ICD-10-CM | POA: Diagnosis not present

## 2015-12-09 DIAGNOSIS — N185 Chronic kidney disease, stage 5: Secondary | ICD-10-CM | POA: Diagnosis not present

## 2015-12-09 DIAGNOSIS — J029 Acute pharyngitis, unspecified: Secondary | ICD-10-CM | POA: Diagnosis not present

## 2015-12-09 DIAGNOSIS — Z1389 Encounter for screening for other disorder: Secondary | ICD-10-CM | POA: Diagnosis not present

## 2015-12-10 DIAGNOSIS — N186 End stage renal disease: Secondary | ICD-10-CM | POA: Diagnosis not present

## 2015-12-10 DIAGNOSIS — Z992 Dependence on renal dialysis: Secondary | ICD-10-CM | POA: Diagnosis not present

## 2015-12-13 DIAGNOSIS — Z992 Dependence on renal dialysis: Secondary | ICD-10-CM | POA: Diagnosis not present

## 2015-12-13 DIAGNOSIS — N186 End stage renal disease: Secondary | ICD-10-CM | POA: Diagnosis not present

## 2015-12-15 DIAGNOSIS — Z992 Dependence on renal dialysis: Secondary | ICD-10-CM | POA: Diagnosis not present

## 2015-12-15 DIAGNOSIS — N186 End stage renal disease: Secondary | ICD-10-CM | POA: Diagnosis not present

## 2015-12-17 DIAGNOSIS — N186 End stage renal disease: Secondary | ICD-10-CM | POA: Diagnosis not present

## 2015-12-17 DIAGNOSIS — Z992 Dependence on renal dialysis: Secondary | ICD-10-CM | POA: Diagnosis not present

## 2015-12-20 DIAGNOSIS — N186 End stage renal disease: Secondary | ICD-10-CM | POA: Diagnosis not present

## 2015-12-20 DIAGNOSIS — Z992 Dependence on renal dialysis: Secondary | ICD-10-CM | POA: Diagnosis not present

## 2015-12-21 DIAGNOSIS — L219 Seborrheic dermatitis, unspecified: Secondary | ICD-10-CM | POA: Diagnosis not present

## 2015-12-22 DIAGNOSIS — N186 End stage renal disease: Secondary | ICD-10-CM | POA: Diagnosis not present

## 2015-12-22 DIAGNOSIS — Z992 Dependence on renal dialysis: Secondary | ICD-10-CM | POA: Diagnosis not present

## 2015-12-24 DIAGNOSIS — N186 End stage renal disease: Secondary | ICD-10-CM | POA: Diagnosis not present

## 2015-12-24 DIAGNOSIS — Z992 Dependence on renal dialysis: Secondary | ICD-10-CM | POA: Diagnosis not present

## 2015-12-27 DIAGNOSIS — N186 End stage renal disease: Secondary | ICD-10-CM | POA: Diagnosis not present

## 2015-12-27 DIAGNOSIS — Z992 Dependence on renal dialysis: Secondary | ICD-10-CM | POA: Diagnosis not present

## 2015-12-29 DIAGNOSIS — N186 End stage renal disease: Secondary | ICD-10-CM | POA: Diagnosis not present

## 2015-12-29 DIAGNOSIS — Z992 Dependence on renal dialysis: Secondary | ICD-10-CM | POA: Diagnosis not present

## 2015-12-31 DIAGNOSIS — N186 End stage renal disease: Secondary | ICD-10-CM | POA: Diagnosis not present

## 2015-12-31 DIAGNOSIS — Z992 Dependence on renal dialysis: Secondary | ICD-10-CM | POA: Diagnosis not present

## 2016-01-03 DIAGNOSIS — Z992 Dependence on renal dialysis: Secondary | ICD-10-CM | POA: Diagnosis not present

## 2016-01-03 DIAGNOSIS — N186 End stage renal disease: Secondary | ICD-10-CM | POA: Diagnosis not present

## 2016-01-07 DIAGNOSIS — N186 End stage renal disease: Secondary | ICD-10-CM | POA: Diagnosis not present

## 2016-01-07 DIAGNOSIS — Z992 Dependence on renal dialysis: Secondary | ICD-10-CM | POA: Diagnosis not present

## 2016-01-10 DIAGNOSIS — N186 End stage renal disease: Secondary | ICD-10-CM | POA: Diagnosis not present

## 2016-01-10 DIAGNOSIS — Z992 Dependence on renal dialysis: Secondary | ICD-10-CM | POA: Diagnosis not present

## 2016-01-12 DIAGNOSIS — Z992 Dependence on renal dialysis: Secondary | ICD-10-CM | POA: Diagnosis not present

## 2016-01-12 DIAGNOSIS — N186 End stage renal disease: Secondary | ICD-10-CM | POA: Diagnosis not present

## 2016-01-14 DIAGNOSIS — Z992 Dependence on renal dialysis: Secondary | ICD-10-CM | POA: Diagnosis not present

## 2016-01-14 DIAGNOSIS — N186 End stage renal disease: Secondary | ICD-10-CM | POA: Diagnosis not present

## 2016-01-17 DIAGNOSIS — Z992 Dependence on renal dialysis: Secondary | ICD-10-CM | POA: Diagnosis not present

## 2016-01-17 DIAGNOSIS — N186 End stage renal disease: Secondary | ICD-10-CM | POA: Diagnosis not present

## 2016-01-19 DIAGNOSIS — N186 End stage renal disease: Secondary | ICD-10-CM | POA: Diagnosis not present

## 2016-01-19 DIAGNOSIS — Z992 Dependence on renal dialysis: Secondary | ICD-10-CM | POA: Diagnosis not present

## 2016-01-21 DIAGNOSIS — Z992 Dependence on renal dialysis: Secondary | ICD-10-CM | POA: Diagnosis not present

## 2016-01-21 DIAGNOSIS — N186 End stage renal disease: Secondary | ICD-10-CM | POA: Diagnosis not present

## 2016-01-24 DIAGNOSIS — Z992 Dependence on renal dialysis: Secondary | ICD-10-CM | POA: Diagnosis not present

## 2016-01-24 DIAGNOSIS — N186 End stage renal disease: Secondary | ICD-10-CM | POA: Diagnosis not present

## 2016-01-26 DIAGNOSIS — N186 End stage renal disease: Secondary | ICD-10-CM | POA: Diagnosis not present

## 2016-01-26 DIAGNOSIS — Z992 Dependence on renal dialysis: Secondary | ICD-10-CM | POA: Diagnosis not present

## 2016-01-27 DIAGNOSIS — Z992 Dependence on renal dialysis: Secondary | ICD-10-CM | POA: Diagnosis not present

## 2016-01-27 DIAGNOSIS — N186 End stage renal disease: Secondary | ICD-10-CM | POA: Diagnosis not present

## 2016-01-28 DIAGNOSIS — Z992 Dependence on renal dialysis: Secondary | ICD-10-CM | POA: Diagnosis not present

## 2016-01-28 DIAGNOSIS — N186 End stage renal disease: Secondary | ICD-10-CM | POA: Diagnosis not present

## 2016-01-31 DIAGNOSIS — Z992 Dependence on renal dialysis: Secondary | ICD-10-CM | POA: Diagnosis not present

## 2016-01-31 DIAGNOSIS — N186 End stage renal disease: Secondary | ICD-10-CM | POA: Diagnosis not present

## 2016-02-02 DIAGNOSIS — Z992 Dependence on renal dialysis: Secondary | ICD-10-CM | POA: Diagnosis not present

## 2016-02-02 DIAGNOSIS — N186 End stage renal disease: Secondary | ICD-10-CM | POA: Diagnosis not present

## 2016-02-04 DIAGNOSIS — N186 End stage renal disease: Secondary | ICD-10-CM | POA: Diagnosis not present

## 2016-02-04 DIAGNOSIS — Z992 Dependence on renal dialysis: Secondary | ICD-10-CM | POA: Diagnosis not present

## 2016-02-07 DIAGNOSIS — N186 End stage renal disease: Secondary | ICD-10-CM | POA: Diagnosis not present

## 2016-02-07 DIAGNOSIS — Z992 Dependence on renal dialysis: Secondary | ICD-10-CM | POA: Diagnosis not present

## 2016-02-09 DIAGNOSIS — Z992 Dependence on renal dialysis: Secondary | ICD-10-CM | POA: Diagnosis not present

## 2016-02-09 DIAGNOSIS — N186 End stage renal disease: Secondary | ICD-10-CM | POA: Diagnosis not present

## 2016-02-11 DIAGNOSIS — N186 End stage renal disease: Secondary | ICD-10-CM | POA: Diagnosis not present

## 2016-02-11 DIAGNOSIS — Z992 Dependence on renal dialysis: Secondary | ICD-10-CM | POA: Diagnosis not present

## 2016-02-14 DIAGNOSIS — Z992 Dependence on renal dialysis: Secondary | ICD-10-CM | POA: Diagnosis not present

## 2016-02-14 DIAGNOSIS — N186 End stage renal disease: Secondary | ICD-10-CM | POA: Diagnosis not present

## 2016-02-16 DIAGNOSIS — N186 End stage renal disease: Secondary | ICD-10-CM | POA: Diagnosis not present

## 2016-02-16 DIAGNOSIS — Z992 Dependence on renal dialysis: Secondary | ICD-10-CM | POA: Diagnosis not present

## 2016-02-18 DIAGNOSIS — Z992 Dependence on renal dialysis: Secondary | ICD-10-CM | POA: Diagnosis not present

## 2016-02-18 DIAGNOSIS — N186 End stage renal disease: Secondary | ICD-10-CM | POA: Diagnosis not present

## 2016-02-21 DIAGNOSIS — N186 End stage renal disease: Secondary | ICD-10-CM | POA: Diagnosis not present

## 2016-02-21 DIAGNOSIS — Z992 Dependence on renal dialysis: Secondary | ICD-10-CM | POA: Diagnosis not present

## 2016-02-23 DIAGNOSIS — N186 End stage renal disease: Secondary | ICD-10-CM | POA: Diagnosis not present

## 2016-02-23 DIAGNOSIS — Z992 Dependence on renal dialysis: Secondary | ICD-10-CM | POA: Diagnosis not present

## 2016-02-25 DIAGNOSIS — N186 End stage renal disease: Secondary | ICD-10-CM | POA: Diagnosis not present

## 2016-02-25 DIAGNOSIS — Z992 Dependence on renal dialysis: Secondary | ICD-10-CM | POA: Diagnosis not present

## 2016-02-26 DIAGNOSIS — N186 End stage renal disease: Secondary | ICD-10-CM | POA: Diagnosis not present

## 2016-02-26 DIAGNOSIS — Z992 Dependence on renal dialysis: Secondary | ICD-10-CM | POA: Diagnosis not present

## 2016-02-28 DIAGNOSIS — N186 End stage renal disease: Secondary | ICD-10-CM | POA: Diagnosis not present

## 2016-02-28 DIAGNOSIS — Z992 Dependence on renal dialysis: Secondary | ICD-10-CM | POA: Diagnosis not present

## 2016-03-01 DIAGNOSIS — N186 End stage renal disease: Secondary | ICD-10-CM | POA: Diagnosis not present

## 2016-03-01 DIAGNOSIS — Z992 Dependence on renal dialysis: Secondary | ICD-10-CM | POA: Diagnosis not present

## 2016-03-06 DIAGNOSIS — Z992 Dependence on renal dialysis: Secondary | ICD-10-CM | POA: Diagnosis not present

## 2016-03-06 DIAGNOSIS — N186 End stage renal disease: Secondary | ICD-10-CM | POA: Diagnosis not present

## 2016-03-08 DIAGNOSIS — Z992 Dependence on renal dialysis: Secondary | ICD-10-CM | POA: Diagnosis not present

## 2016-03-08 DIAGNOSIS — N186 End stage renal disease: Secondary | ICD-10-CM | POA: Diagnosis not present

## 2016-03-10 DIAGNOSIS — N186 End stage renal disease: Secondary | ICD-10-CM | POA: Diagnosis not present

## 2016-03-10 DIAGNOSIS — Z992 Dependence on renal dialysis: Secondary | ICD-10-CM | POA: Diagnosis not present

## 2016-03-13 DIAGNOSIS — Z992 Dependence on renal dialysis: Secondary | ICD-10-CM | POA: Diagnosis not present

## 2016-03-13 DIAGNOSIS — N186 End stage renal disease: Secondary | ICD-10-CM | POA: Diagnosis not present

## 2016-03-15 DIAGNOSIS — N186 End stage renal disease: Secondary | ICD-10-CM | POA: Diagnosis not present

## 2016-03-15 DIAGNOSIS — Z992 Dependence on renal dialysis: Secondary | ICD-10-CM | POA: Diagnosis not present

## 2016-03-17 DIAGNOSIS — Z992 Dependence on renal dialysis: Secondary | ICD-10-CM | POA: Diagnosis not present

## 2016-03-17 DIAGNOSIS — N186 End stage renal disease: Secondary | ICD-10-CM | POA: Diagnosis not present

## 2016-03-20 DIAGNOSIS — Z992 Dependence on renal dialysis: Secondary | ICD-10-CM | POA: Diagnosis not present

## 2016-03-20 DIAGNOSIS — N186 End stage renal disease: Secondary | ICD-10-CM | POA: Diagnosis not present

## 2016-03-22 DIAGNOSIS — Z992 Dependence on renal dialysis: Secondary | ICD-10-CM | POA: Diagnosis not present

## 2016-03-22 DIAGNOSIS — N186 End stage renal disease: Secondary | ICD-10-CM | POA: Diagnosis not present

## 2016-03-24 DIAGNOSIS — N186 End stage renal disease: Secondary | ICD-10-CM | POA: Diagnosis not present

## 2016-03-24 DIAGNOSIS — Z992 Dependence on renal dialysis: Secondary | ICD-10-CM | POA: Diagnosis not present

## 2016-03-27 DIAGNOSIS — N186 End stage renal disease: Secondary | ICD-10-CM | POA: Diagnosis not present

## 2016-03-27 DIAGNOSIS — Z992 Dependence on renal dialysis: Secondary | ICD-10-CM | POA: Diagnosis not present

## 2016-03-28 DIAGNOSIS — Z992 Dependence on renal dialysis: Secondary | ICD-10-CM | POA: Diagnosis not present

## 2016-03-28 DIAGNOSIS — N186 End stage renal disease: Secondary | ICD-10-CM | POA: Diagnosis not present

## 2016-03-29 DIAGNOSIS — N186 End stage renal disease: Secondary | ICD-10-CM | POA: Diagnosis not present

## 2016-03-29 DIAGNOSIS — Z992 Dependence on renal dialysis: Secondary | ICD-10-CM | POA: Diagnosis not present

## 2016-03-31 DIAGNOSIS — N186 End stage renal disease: Secondary | ICD-10-CM | POA: Diagnosis not present

## 2016-03-31 DIAGNOSIS — Z992 Dependence on renal dialysis: Secondary | ICD-10-CM | POA: Diagnosis not present

## 2016-04-03 DIAGNOSIS — N186 End stage renal disease: Secondary | ICD-10-CM | POA: Diagnosis not present

## 2016-04-03 DIAGNOSIS — Z992 Dependence on renal dialysis: Secondary | ICD-10-CM | POA: Diagnosis not present

## 2016-04-05 DIAGNOSIS — N186 End stage renal disease: Secondary | ICD-10-CM | POA: Diagnosis not present

## 2016-04-05 DIAGNOSIS — Z992 Dependence on renal dialysis: Secondary | ICD-10-CM | POA: Diagnosis not present

## 2016-04-07 DIAGNOSIS — Z992 Dependence on renal dialysis: Secondary | ICD-10-CM | POA: Diagnosis not present

## 2016-04-07 DIAGNOSIS — N186 End stage renal disease: Secondary | ICD-10-CM | POA: Diagnosis not present

## 2016-04-10 DIAGNOSIS — Z992 Dependence on renal dialysis: Secondary | ICD-10-CM | POA: Diagnosis not present

## 2016-04-10 DIAGNOSIS — N186 End stage renal disease: Secondary | ICD-10-CM | POA: Diagnosis not present

## 2016-04-12 DIAGNOSIS — Z992 Dependence on renal dialysis: Secondary | ICD-10-CM | POA: Diagnosis not present

## 2016-04-12 DIAGNOSIS — N186 End stage renal disease: Secondary | ICD-10-CM | POA: Diagnosis not present

## 2016-04-14 DIAGNOSIS — Z992 Dependence on renal dialysis: Secondary | ICD-10-CM | POA: Diagnosis not present

## 2016-04-14 DIAGNOSIS — N186 End stage renal disease: Secondary | ICD-10-CM | POA: Diagnosis not present

## 2016-04-17 DIAGNOSIS — Z992 Dependence on renal dialysis: Secondary | ICD-10-CM | POA: Diagnosis not present

## 2016-04-17 DIAGNOSIS — N186 End stage renal disease: Secondary | ICD-10-CM | POA: Diagnosis not present

## 2016-04-19 DIAGNOSIS — N186 End stage renal disease: Secondary | ICD-10-CM | POA: Diagnosis not present

## 2016-04-19 DIAGNOSIS — Z992 Dependence on renal dialysis: Secondary | ICD-10-CM | POA: Diagnosis not present

## 2016-04-21 DIAGNOSIS — N186 End stage renal disease: Secondary | ICD-10-CM | POA: Diagnosis not present

## 2016-04-21 DIAGNOSIS — Z992 Dependence on renal dialysis: Secondary | ICD-10-CM | POA: Diagnosis not present

## 2016-04-24 DIAGNOSIS — N186 End stage renal disease: Secondary | ICD-10-CM | POA: Diagnosis not present

## 2016-04-24 DIAGNOSIS — Z992 Dependence on renal dialysis: Secondary | ICD-10-CM | POA: Diagnosis not present

## 2016-04-26 DIAGNOSIS — Z992 Dependence on renal dialysis: Secondary | ICD-10-CM | POA: Diagnosis not present

## 2016-04-26 DIAGNOSIS — N186 End stage renal disease: Secondary | ICD-10-CM | POA: Diagnosis not present

## 2016-04-27 DIAGNOSIS — Z992 Dependence on renal dialysis: Secondary | ICD-10-CM | POA: Diagnosis not present

## 2016-04-27 DIAGNOSIS — N186 End stage renal disease: Secondary | ICD-10-CM | POA: Diagnosis not present

## 2016-04-28 DIAGNOSIS — N186 End stage renal disease: Secondary | ICD-10-CM | POA: Diagnosis not present

## 2016-04-28 DIAGNOSIS — Z992 Dependence on renal dialysis: Secondary | ICD-10-CM | POA: Diagnosis not present

## 2016-04-30 DIAGNOSIS — I10 Essential (primary) hypertension: Secondary | ICD-10-CM | POA: Diagnosis not present

## 2016-04-30 DIAGNOSIS — M1990 Unspecified osteoarthritis, unspecified site: Secondary | ICD-10-CM | POA: Diagnosis not present

## 2016-04-30 DIAGNOSIS — Z1389 Encounter for screening for other disorder: Secondary | ICD-10-CM | POA: Diagnosis not present

## 2016-05-01 DIAGNOSIS — Z992 Dependence on renal dialysis: Secondary | ICD-10-CM | POA: Diagnosis not present

## 2016-05-01 DIAGNOSIS — N186 End stage renal disease: Secondary | ICD-10-CM | POA: Diagnosis not present

## 2016-05-03 DIAGNOSIS — N186 End stage renal disease: Secondary | ICD-10-CM | POA: Diagnosis not present

## 2016-05-03 DIAGNOSIS — Z992 Dependence on renal dialysis: Secondary | ICD-10-CM | POA: Diagnosis not present

## 2016-05-05 DIAGNOSIS — N186 End stage renal disease: Secondary | ICD-10-CM | POA: Diagnosis not present

## 2016-05-05 DIAGNOSIS — Z992 Dependence on renal dialysis: Secondary | ICD-10-CM | POA: Diagnosis not present

## 2016-05-07 DIAGNOSIS — N186 End stage renal disease: Secondary | ICD-10-CM | POA: Diagnosis not present

## 2016-05-07 DIAGNOSIS — Z992 Dependence on renal dialysis: Secondary | ICD-10-CM | POA: Diagnosis not present

## 2016-05-10 DIAGNOSIS — Z992 Dependence on renal dialysis: Secondary | ICD-10-CM | POA: Diagnosis not present

## 2016-05-10 DIAGNOSIS — N186 End stage renal disease: Secondary | ICD-10-CM | POA: Diagnosis not present

## 2016-05-12 DIAGNOSIS — Z992 Dependence on renal dialysis: Secondary | ICD-10-CM | POA: Diagnosis not present

## 2016-05-12 DIAGNOSIS — N186 End stage renal disease: Secondary | ICD-10-CM | POA: Diagnosis not present

## 2016-05-15 DIAGNOSIS — N186 End stage renal disease: Secondary | ICD-10-CM | POA: Diagnosis not present

## 2016-05-15 DIAGNOSIS — Z992 Dependence on renal dialysis: Secondary | ICD-10-CM | POA: Diagnosis not present

## 2016-05-17 DIAGNOSIS — N186 End stage renal disease: Secondary | ICD-10-CM | POA: Diagnosis not present

## 2016-05-17 DIAGNOSIS — Z992 Dependence on renal dialysis: Secondary | ICD-10-CM | POA: Diagnosis not present

## 2016-05-19 DIAGNOSIS — N186 End stage renal disease: Secondary | ICD-10-CM | POA: Diagnosis not present

## 2016-05-19 DIAGNOSIS — Z992 Dependence on renal dialysis: Secondary | ICD-10-CM | POA: Diagnosis not present

## 2016-05-22 DIAGNOSIS — Z992 Dependence on renal dialysis: Secondary | ICD-10-CM | POA: Diagnosis not present

## 2016-05-22 DIAGNOSIS — N186 End stage renal disease: Secondary | ICD-10-CM | POA: Diagnosis not present

## 2016-05-24 DIAGNOSIS — N186 End stage renal disease: Secondary | ICD-10-CM | POA: Diagnosis not present

## 2016-05-24 DIAGNOSIS — Z992 Dependence on renal dialysis: Secondary | ICD-10-CM | POA: Diagnosis not present

## 2016-05-26 DIAGNOSIS — Z992 Dependence on renal dialysis: Secondary | ICD-10-CM | POA: Diagnosis not present

## 2016-05-26 DIAGNOSIS — N186 End stage renal disease: Secondary | ICD-10-CM | POA: Diagnosis not present

## 2016-05-28 DIAGNOSIS — Z992 Dependence on renal dialysis: Secondary | ICD-10-CM | POA: Diagnosis not present

## 2016-05-28 DIAGNOSIS — N186 End stage renal disease: Secondary | ICD-10-CM | POA: Diagnosis not present

## 2016-05-29 DIAGNOSIS — N186 End stage renal disease: Secondary | ICD-10-CM | POA: Diagnosis not present

## 2016-05-29 DIAGNOSIS — Z992 Dependence on renal dialysis: Secondary | ICD-10-CM | POA: Diagnosis not present

## 2016-05-31 DIAGNOSIS — N186 End stage renal disease: Secondary | ICD-10-CM | POA: Diagnosis not present

## 2016-05-31 DIAGNOSIS — Z992 Dependence on renal dialysis: Secondary | ICD-10-CM | POA: Diagnosis not present

## 2016-06-02 DIAGNOSIS — N186 End stage renal disease: Secondary | ICD-10-CM | POA: Diagnosis not present

## 2016-06-02 DIAGNOSIS — Z992 Dependence on renal dialysis: Secondary | ICD-10-CM | POA: Diagnosis not present

## 2016-06-05 DIAGNOSIS — Z992 Dependence on renal dialysis: Secondary | ICD-10-CM | POA: Diagnosis not present

## 2016-06-05 DIAGNOSIS — N186 End stage renal disease: Secondary | ICD-10-CM | POA: Diagnosis not present

## 2016-06-07 DIAGNOSIS — Z992 Dependence on renal dialysis: Secondary | ICD-10-CM | POA: Diagnosis not present

## 2016-06-07 DIAGNOSIS — N186 End stage renal disease: Secondary | ICD-10-CM | POA: Diagnosis not present

## 2016-06-09 DIAGNOSIS — N186 End stage renal disease: Secondary | ICD-10-CM | POA: Diagnosis not present

## 2016-06-09 DIAGNOSIS — Z992 Dependence on renal dialysis: Secondary | ICD-10-CM | POA: Diagnosis not present

## 2016-06-12 DIAGNOSIS — E611 Iron deficiency: Secondary | ICD-10-CM | POA: Diagnosis not present

## 2016-06-12 DIAGNOSIS — Z992 Dependence on renal dialysis: Secondary | ICD-10-CM | POA: Diagnosis not present

## 2016-06-12 DIAGNOSIS — N186 End stage renal disease: Secondary | ICD-10-CM | POA: Diagnosis not present

## 2016-06-14 DIAGNOSIS — Z992 Dependence on renal dialysis: Secondary | ICD-10-CM | POA: Diagnosis not present

## 2016-06-14 DIAGNOSIS — N186 End stage renal disease: Secondary | ICD-10-CM | POA: Diagnosis not present

## 2016-06-16 DIAGNOSIS — N186 End stage renal disease: Secondary | ICD-10-CM | POA: Diagnosis not present

## 2016-06-16 DIAGNOSIS — Z992 Dependence on renal dialysis: Secondary | ICD-10-CM | POA: Diagnosis not present

## 2016-06-19 DIAGNOSIS — Z992 Dependence on renal dialysis: Secondary | ICD-10-CM | POA: Diagnosis not present

## 2016-06-19 DIAGNOSIS — N186 End stage renal disease: Secondary | ICD-10-CM | POA: Diagnosis not present

## 2016-06-21 DIAGNOSIS — Z992 Dependence on renal dialysis: Secondary | ICD-10-CM | POA: Diagnosis not present

## 2016-06-21 DIAGNOSIS — N186 End stage renal disease: Secondary | ICD-10-CM | POA: Diagnosis not present

## 2016-06-23 DIAGNOSIS — N186 End stage renal disease: Secondary | ICD-10-CM | POA: Diagnosis not present

## 2016-06-23 DIAGNOSIS — Z992 Dependence on renal dialysis: Secondary | ICD-10-CM | POA: Diagnosis not present

## 2016-06-26 DIAGNOSIS — N186 End stage renal disease: Secondary | ICD-10-CM | POA: Diagnosis not present

## 2016-06-26 DIAGNOSIS — Z992 Dependence on renal dialysis: Secondary | ICD-10-CM | POA: Diagnosis not present

## 2016-06-28 DIAGNOSIS — Z992 Dependence on renal dialysis: Secondary | ICD-10-CM | POA: Diagnosis not present

## 2016-06-28 DIAGNOSIS — N186 End stage renal disease: Secondary | ICD-10-CM | POA: Diagnosis not present

## 2016-06-30 DIAGNOSIS — Z992 Dependence on renal dialysis: Secondary | ICD-10-CM | POA: Diagnosis not present

## 2016-06-30 DIAGNOSIS — N186 End stage renal disease: Secondary | ICD-10-CM | POA: Diagnosis not present

## 2016-06-30 DIAGNOSIS — Z23 Encounter for immunization: Secondary | ICD-10-CM | POA: Diagnosis not present

## 2016-07-03 DIAGNOSIS — Z992 Dependence on renal dialysis: Secondary | ICD-10-CM | POA: Diagnosis not present

## 2016-07-03 DIAGNOSIS — N186 End stage renal disease: Secondary | ICD-10-CM | POA: Diagnosis not present

## 2016-07-03 DIAGNOSIS — Z23 Encounter for immunization: Secondary | ICD-10-CM | POA: Diagnosis not present

## 2016-07-05 DIAGNOSIS — Z23 Encounter for immunization: Secondary | ICD-10-CM | POA: Diagnosis not present

## 2016-07-05 DIAGNOSIS — N186 End stage renal disease: Secondary | ICD-10-CM | POA: Diagnosis not present

## 2016-07-05 DIAGNOSIS — Z992 Dependence on renal dialysis: Secondary | ICD-10-CM | POA: Diagnosis not present

## 2016-07-07 DIAGNOSIS — N186 End stage renal disease: Secondary | ICD-10-CM | POA: Diagnosis not present

## 2016-07-07 DIAGNOSIS — Z23 Encounter for immunization: Secondary | ICD-10-CM | POA: Diagnosis not present

## 2016-07-07 DIAGNOSIS — Z992 Dependence on renal dialysis: Secondary | ICD-10-CM | POA: Diagnosis not present

## 2016-07-09 DIAGNOSIS — N186 End stage renal disease: Secondary | ICD-10-CM | POA: Diagnosis not present

## 2016-07-09 DIAGNOSIS — Z992 Dependence on renal dialysis: Secondary | ICD-10-CM | POA: Diagnosis not present

## 2016-07-09 DIAGNOSIS — Z23 Encounter for immunization: Secondary | ICD-10-CM | POA: Diagnosis not present

## 2016-07-12 DIAGNOSIS — Z992 Dependence on renal dialysis: Secondary | ICD-10-CM | POA: Diagnosis not present

## 2016-07-12 DIAGNOSIS — Z23 Encounter for immunization: Secondary | ICD-10-CM | POA: Diagnosis not present

## 2016-07-12 DIAGNOSIS — E611 Iron deficiency: Secondary | ICD-10-CM | POA: Diagnosis not present

## 2016-07-12 DIAGNOSIS — N186 End stage renal disease: Secondary | ICD-10-CM | POA: Diagnosis not present

## 2016-07-14 DIAGNOSIS — Z992 Dependence on renal dialysis: Secondary | ICD-10-CM | POA: Diagnosis not present

## 2016-07-14 DIAGNOSIS — N186 End stage renal disease: Secondary | ICD-10-CM | POA: Diagnosis not present

## 2016-07-14 DIAGNOSIS — Z23 Encounter for immunization: Secondary | ICD-10-CM | POA: Diagnosis not present

## 2016-07-17 DIAGNOSIS — N186 End stage renal disease: Secondary | ICD-10-CM | POA: Diagnosis not present

## 2016-07-17 DIAGNOSIS — Z23 Encounter for immunization: Secondary | ICD-10-CM | POA: Diagnosis not present

## 2016-07-17 DIAGNOSIS — Z992 Dependence on renal dialysis: Secondary | ICD-10-CM | POA: Diagnosis not present

## 2016-07-19 DIAGNOSIS — N186 End stage renal disease: Secondary | ICD-10-CM | POA: Diagnosis not present

## 2016-07-19 DIAGNOSIS — Z992 Dependence on renal dialysis: Secondary | ICD-10-CM | POA: Diagnosis not present

## 2016-07-19 DIAGNOSIS — Z23 Encounter for immunization: Secondary | ICD-10-CM | POA: Diagnosis not present

## 2016-07-21 DIAGNOSIS — Z992 Dependence on renal dialysis: Secondary | ICD-10-CM | POA: Diagnosis not present

## 2016-07-21 DIAGNOSIS — N186 End stage renal disease: Secondary | ICD-10-CM | POA: Diagnosis not present

## 2016-07-21 DIAGNOSIS — Z23 Encounter for immunization: Secondary | ICD-10-CM | POA: Diagnosis not present

## 2016-07-24 DIAGNOSIS — N186 End stage renal disease: Secondary | ICD-10-CM | POA: Diagnosis not present

## 2016-07-24 DIAGNOSIS — Z992 Dependence on renal dialysis: Secondary | ICD-10-CM | POA: Diagnosis not present

## 2016-07-24 DIAGNOSIS — Z23 Encounter for immunization: Secondary | ICD-10-CM | POA: Diagnosis not present

## 2016-07-26 DIAGNOSIS — N186 End stage renal disease: Secondary | ICD-10-CM | POA: Diagnosis not present

## 2016-07-26 DIAGNOSIS — Z23 Encounter for immunization: Secondary | ICD-10-CM | POA: Diagnosis not present

## 2016-07-26 DIAGNOSIS — Z992 Dependence on renal dialysis: Secondary | ICD-10-CM | POA: Diagnosis not present

## 2016-07-28 DIAGNOSIS — Z23 Encounter for immunization: Secondary | ICD-10-CM | POA: Diagnosis not present

## 2016-07-28 DIAGNOSIS — Z992 Dependence on renal dialysis: Secondary | ICD-10-CM | POA: Diagnosis not present

## 2016-07-28 DIAGNOSIS — N186 End stage renal disease: Secondary | ICD-10-CM | POA: Diagnosis not present

## 2016-07-31 DIAGNOSIS — Z992 Dependence on renal dialysis: Secondary | ICD-10-CM | POA: Diagnosis not present

## 2016-07-31 DIAGNOSIS — N186 End stage renal disease: Secondary | ICD-10-CM | POA: Diagnosis not present

## 2016-08-02 DIAGNOSIS — N186 End stage renal disease: Secondary | ICD-10-CM | POA: Diagnosis not present

## 2016-08-02 DIAGNOSIS — Z992 Dependence on renal dialysis: Secondary | ICD-10-CM | POA: Diagnosis not present

## 2016-08-04 DIAGNOSIS — Z992 Dependence on renal dialysis: Secondary | ICD-10-CM | POA: Diagnosis not present

## 2016-08-04 DIAGNOSIS — N186 End stage renal disease: Secondary | ICD-10-CM | POA: Diagnosis not present

## 2016-08-07 DIAGNOSIS — N186 End stage renal disease: Secondary | ICD-10-CM | POA: Diagnosis not present

## 2016-08-07 DIAGNOSIS — Z992 Dependence on renal dialysis: Secondary | ICD-10-CM | POA: Diagnosis not present

## 2016-08-09 DIAGNOSIS — Z992 Dependence on renal dialysis: Secondary | ICD-10-CM | POA: Diagnosis not present

## 2016-08-09 DIAGNOSIS — N186 End stage renal disease: Secondary | ICD-10-CM | POA: Diagnosis not present

## 2016-08-11 DIAGNOSIS — N186 End stage renal disease: Secondary | ICD-10-CM | POA: Diagnosis not present

## 2016-08-11 DIAGNOSIS — Z992 Dependence on renal dialysis: Secondary | ICD-10-CM | POA: Diagnosis not present

## 2016-08-14 DIAGNOSIS — N186 End stage renal disease: Secondary | ICD-10-CM | POA: Diagnosis not present

## 2016-08-14 DIAGNOSIS — Z992 Dependence on renal dialysis: Secondary | ICD-10-CM | POA: Diagnosis not present

## 2016-08-16 DIAGNOSIS — N186 End stage renal disease: Secondary | ICD-10-CM | POA: Diagnosis not present

## 2016-08-16 DIAGNOSIS — Z992 Dependence on renal dialysis: Secondary | ICD-10-CM | POA: Diagnosis not present

## 2016-08-18 DIAGNOSIS — Z992 Dependence on renal dialysis: Secondary | ICD-10-CM | POA: Diagnosis not present

## 2016-08-18 DIAGNOSIS — N186 End stage renal disease: Secondary | ICD-10-CM | POA: Diagnosis not present

## 2016-08-21 DIAGNOSIS — Z992 Dependence on renal dialysis: Secondary | ICD-10-CM | POA: Diagnosis not present

## 2016-08-21 DIAGNOSIS — N186 End stage renal disease: Secondary | ICD-10-CM | POA: Diagnosis not present

## 2016-08-22 ENCOUNTER — Ambulatory Visit (HOSPITAL_COMMUNITY)
Admission: RE | Admit: 2016-08-22 | Discharge: 2016-08-22 | Disposition: A | Discharge status: Home / Self Care | Payer: Medicare Other | Source: Ambulatory Visit | Attending: Internal Medicine | Admitting: Internal Medicine

## 2016-08-22 ENCOUNTER — Other Ambulatory Visit (HOSPITAL_COMMUNITY): Payer: Self-pay | Admitting: Internal Medicine

## 2016-08-22 DIAGNOSIS — J060 Acute laryngopharyngitis: Secondary | ICD-10-CM | POA: Diagnosis not present

## 2016-08-22 DIAGNOSIS — R0600 Dyspnea, unspecified: Secondary | ICD-10-CM | POA: Insufficient documentation

## 2016-08-22 DIAGNOSIS — E782 Mixed hyperlipidemia: Secondary | ICD-10-CM | POA: Diagnosis not present

## 2016-08-22 DIAGNOSIS — K219 Gastro-esophageal reflux disease without esophagitis: Secondary | ICD-10-CM | POA: Diagnosis not present

## 2016-08-22 DIAGNOSIS — N185 Chronic kidney disease, stage 5: Secondary | ICD-10-CM | POA: Diagnosis not present

## 2016-08-22 DIAGNOSIS — J90 Pleural effusion, not elsewhere classified: Secondary | ICD-10-CM | POA: Diagnosis not present

## 2016-08-22 DIAGNOSIS — Z1389 Encounter for screening for other disorder: Secondary | ICD-10-CM | POA: Diagnosis not present

## 2016-08-22 DIAGNOSIS — R600 Localized edema: Secondary | ICD-10-CM | POA: Diagnosis not present

## 2016-08-23 DIAGNOSIS — N186 End stage renal disease: Secondary | ICD-10-CM | POA: Diagnosis not present

## 2016-08-23 DIAGNOSIS — Z992 Dependence on renal dialysis: Secondary | ICD-10-CM | POA: Diagnosis not present

## 2016-08-25 DIAGNOSIS — N186 End stage renal disease: Secondary | ICD-10-CM | POA: Diagnosis not present

## 2016-08-25 DIAGNOSIS — Z992 Dependence on renal dialysis: Secondary | ICD-10-CM | POA: Diagnosis not present

## 2016-08-28 DIAGNOSIS — Z992 Dependence on renal dialysis: Secondary | ICD-10-CM | POA: Diagnosis not present

## 2016-08-28 DIAGNOSIS — N186 End stage renal disease: Secondary | ICD-10-CM | POA: Diagnosis not present

## 2016-08-30 DIAGNOSIS — N186 End stage renal disease: Secondary | ICD-10-CM | POA: Diagnosis not present

## 2016-08-30 DIAGNOSIS — Z992 Dependence on renal dialysis: Secondary | ICD-10-CM | POA: Diagnosis not present

## 2016-09-01 DIAGNOSIS — N186 End stage renal disease: Secondary | ICD-10-CM | POA: Diagnosis not present

## 2016-09-01 DIAGNOSIS — Z992 Dependence on renal dialysis: Secondary | ICD-10-CM | POA: Diagnosis not present

## 2016-09-04 DIAGNOSIS — N186 End stage renal disease: Secondary | ICD-10-CM | POA: Diagnosis not present

## 2016-09-04 DIAGNOSIS — Z992 Dependence on renal dialysis: Secondary | ICD-10-CM | POA: Diagnosis not present

## 2016-09-06 DIAGNOSIS — Z992 Dependence on renal dialysis: Secondary | ICD-10-CM | POA: Diagnosis not present

## 2016-09-06 DIAGNOSIS — N186 End stage renal disease: Secondary | ICD-10-CM | POA: Diagnosis not present

## 2016-09-08 DIAGNOSIS — N186 End stage renal disease: Secondary | ICD-10-CM | POA: Diagnosis not present

## 2016-09-08 DIAGNOSIS — Z992 Dependence on renal dialysis: Secondary | ICD-10-CM | POA: Diagnosis not present

## 2016-09-11 DIAGNOSIS — N186 End stage renal disease: Secondary | ICD-10-CM | POA: Diagnosis not present

## 2016-09-11 DIAGNOSIS — Z992 Dependence on renal dialysis: Secondary | ICD-10-CM | POA: Diagnosis not present

## 2016-09-13 DIAGNOSIS — Z992 Dependence on renal dialysis: Secondary | ICD-10-CM | POA: Diagnosis not present

## 2016-09-13 DIAGNOSIS — N186 End stage renal disease: Secondary | ICD-10-CM | POA: Diagnosis not present

## 2016-09-15 DIAGNOSIS — N186 End stage renal disease: Secondary | ICD-10-CM | POA: Diagnosis not present

## 2016-09-15 DIAGNOSIS — Z992 Dependence on renal dialysis: Secondary | ICD-10-CM | POA: Diagnosis not present

## 2016-09-18 DIAGNOSIS — N186 End stage renal disease: Secondary | ICD-10-CM | POA: Diagnosis not present

## 2016-09-18 DIAGNOSIS — Z992 Dependence on renal dialysis: Secondary | ICD-10-CM | POA: Diagnosis not present

## 2016-09-20 DIAGNOSIS — N186 End stage renal disease: Secondary | ICD-10-CM | POA: Diagnosis not present

## 2016-09-20 DIAGNOSIS — Z992 Dependence on renal dialysis: Secondary | ICD-10-CM | POA: Diagnosis not present

## 2016-09-22 DIAGNOSIS — Z992 Dependence on renal dialysis: Secondary | ICD-10-CM | POA: Diagnosis not present

## 2016-09-22 DIAGNOSIS — N186 End stage renal disease: Secondary | ICD-10-CM | POA: Diagnosis not present

## 2016-09-25 DIAGNOSIS — N186 End stage renal disease: Secondary | ICD-10-CM | POA: Diagnosis not present

## 2016-09-25 DIAGNOSIS — Z992 Dependence on renal dialysis: Secondary | ICD-10-CM | POA: Diagnosis not present

## 2016-09-26 ENCOUNTER — Encounter (HOSPITAL_COMMUNITY): Payer: Self-pay | Admitting: *Deleted

## 2016-09-26 ENCOUNTER — Inpatient Hospital Stay (HOSPITAL_COMMUNITY)
Admission: EM | Admit: 2016-09-26 | Discharge: 2016-09-29 | DRG: 291 | Disposition: A | Discharge status: Home / Self Care | Payer: Medicare Other | Attending: Family Medicine | Admitting: Family Medicine

## 2016-09-26 ENCOUNTER — Emergency Department (HOSPITAL_COMMUNITY): Payer: Medicare Other

## 2016-09-26 ENCOUNTER — Observation Stay (HOSPITAL_BASED_OUTPATIENT_CLINIC_OR_DEPARTMENT_OTHER): Payer: Medicare Other

## 2016-09-26 DIAGNOSIS — M898X9 Other specified disorders of bone, unspecified site: Secondary | ICD-10-CM | POA: Diagnosis present

## 2016-09-26 DIAGNOSIS — G629 Polyneuropathy, unspecified: Secondary | ICD-10-CM | POA: Diagnosis not present

## 2016-09-26 DIAGNOSIS — R0602 Shortness of breath: Secondary | ICD-10-CM

## 2016-09-26 DIAGNOSIS — E559 Vitamin D deficiency, unspecified: Secondary | ICD-10-CM | POA: Diagnosis not present

## 2016-09-26 DIAGNOSIS — I10 Essential (primary) hypertension: Secondary | ICD-10-CM | POA: Diagnosis not present

## 2016-09-26 DIAGNOSIS — D631 Anemia in chronic kidney disease: Secondary | ICD-10-CM | POA: Diagnosis not present

## 2016-09-26 DIAGNOSIS — I5030 Unspecified diastolic (congestive) heart failure: Secondary | ICD-10-CM | POA: Diagnosis not present

## 2016-09-26 DIAGNOSIS — Z809 Family history of malignant neoplasm, unspecified: Secondary | ICD-10-CM

## 2016-09-26 DIAGNOSIS — Z8249 Family history of ischemic heart disease and other diseases of the circulatory system: Secondary | ICD-10-CM

## 2016-09-26 DIAGNOSIS — M899 Disorder of bone, unspecified: Secondary | ICD-10-CM | POA: Diagnosis present

## 2016-09-26 DIAGNOSIS — I509 Heart failure, unspecified: Secondary | ICD-10-CM

## 2016-09-26 DIAGNOSIS — J811 Chronic pulmonary edema: Secondary | ICD-10-CM | POA: Diagnosis present

## 2016-09-26 DIAGNOSIS — Z823 Family history of stroke: Secondary | ICD-10-CM | POA: Diagnosis not present

## 2016-09-26 DIAGNOSIS — Z992 Dependence on renal dialysis: Secondary | ICD-10-CM | POA: Diagnosis not present

## 2016-09-26 DIAGNOSIS — I248 Other forms of acute ischemic heart disease: Secondary | ICD-10-CM | POA: Diagnosis not present

## 2016-09-26 DIAGNOSIS — J810 Acute pulmonary edema: Secondary | ICD-10-CM | POA: Diagnosis not present

## 2016-09-26 DIAGNOSIS — F419 Anxiety disorder, unspecified: Secondary | ICD-10-CM | POA: Diagnosis present

## 2016-09-26 DIAGNOSIS — I132 Hypertensive heart and chronic kidney disease with heart failure and with stage 5 chronic kidney disease, or end stage renal disease: Secondary | ICD-10-CM | POA: Diagnosis not present

## 2016-09-26 DIAGNOSIS — Z79899 Other long term (current) drug therapy: Secondary | ICD-10-CM

## 2016-09-26 DIAGNOSIS — N2581 Secondary hyperparathyroidism of renal origin: Secondary | ICD-10-CM | POA: Diagnosis present

## 2016-09-26 DIAGNOSIS — R0902 Hypoxemia: Secondary | ICD-10-CM | POA: Diagnosis not present

## 2016-09-26 DIAGNOSIS — I160 Hypertensive urgency: Secondary | ICD-10-CM | POA: Diagnosis not present

## 2016-09-26 DIAGNOSIS — N186 End stage renal disease: Secondary | ICD-10-CM | POA: Diagnosis present

## 2016-09-26 LAB — ECHOCARDIOGRAM COMPLETE
HEIGHTINCHES: 64 in
WEIGHTICAEL: 2776.03 [oz_av]

## 2016-09-26 LAB — CBC
HCT: 32.9 % — ABNORMAL LOW (ref 36.0–46.0)
Hemoglobin: 10.2 g/dL — ABNORMAL LOW (ref 12.0–15.0)
MCH: 30.4 pg (ref 26.0–34.0)
MCHC: 31 g/dL (ref 30.0–36.0)
MCV: 97.9 fL (ref 78.0–100.0)
Platelets: 240 10*3/uL (ref 150–400)
RBC: 3.36 MIL/uL — ABNORMAL LOW (ref 3.87–5.11)
RDW: 13.8 % (ref 11.5–15.5)
WBC: 11.4 10*3/uL — ABNORMAL HIGH (ref 4.0–10.5)

## 2016-09-26 LAB — CBC WITH DIFFERENTIAL/PLATELET
Basophils Absolute: 0.1 10*3/uL (ref 0.0–0.1)
Basophils Relative: 1 %
EOS ABS: 0.3 10*3/uL (ref 0.0–0.7)
Eosinophils Relative: 3 %
HEMATOCRIT: 34.7 % — AB (ref 36.0–46.0)
HEMOGLOBIN: 10.7 g/dL — AB (ref 12.0–15.0)
LYMPHS ABS: 1.5 10*3/uL (ref 0.7–4.0)
Lymphocytes Relative: 14 %
MCH: 30.2 pg (ref 26.0–34.0)
MCHC: 30.8 g/dL (ref 30.0–36.0)
MCV: 98 fL (ref 78.0–100.0)
MONOS PCT: 9 %
Monocytes Absolute: 1 10*3/uL (ref 0.1–1.0)
NEUTROS ABS: 7.9 10*3/uL — AB (ref 1.7–7.7)
NEUTROS PCT: 73 %
Platelets: 270 10*3/uL (ref 150–400)
RBC: 3.54 MIL/uL — AB (ref 3.87–5.11)
RDW: 13.8 % (ref 11.5–15.5)
WBC: 10.7 10*3/uL — AB (ref 4.0–10.5)

## 2016-09-26 LAB — RENAL FUNCTION PANEL
Albumin: 3.5 g/dL (ref 3.5–5.0)
Anion gap: 11 (ref 5–15)
BUN: 33 mg/dL — ABNORMAL HIGH (ref 6–20)
CO2: 28 mmol/L (ref 22–32)
Calcium: 9.6 mg/dL (ref 8.9–10.3)
Chloride: 100 mmol/L — ABNORMAL LOW (ref 101–111)
Creatinine, Ser: 5.82 mg/dL — ABNORMAL HIGH (ref 0.44–1.00)
GFR calc Af Amer: 7 mL/min — ABNORMAL LOW (ref >60)
GFR calc non Af Amer: 6 mL/min — ABNORMAL LOW (ref >60)
Glucose, Bld: 99 mg/dL (ref 65–99)
Phosphorus: 4.9 mg/dL — ABNORMAL HIGH (ref 2.5–4.6)
Potassium: 4.2 mmol/L (ref 3.5–5.1)
Sodium: 139 mmol/L (ref 135–145)

## 2016-09-26 LAB — BASIC METABOLIC PANEL
ANION GAP: 11 (ref 5–15)
BUN: 32 mg/dL — ABNORMAL HIGH (ref 6–20)
CHLORIDE: 100 mmol/L — AB (ref 101–111)
CO2: 28 mmol/L (ref 22–32)
Calcium: 9.9 mg/dL (ref 8.9–10.3)
Creatinine, Ser: 5.69 mg/dL — ABNORMAL HIGH (ref 0.44–1.00)
GFR calc Af Amer: 7 mL/min — ABNORMAL LOW (ref >60)
GFR, EST NON AFRICAN AMERICAN: 6 mL/min — AB (ref >60)
Glucose, Bld: 150 mg/dL — ABNORMAL HIGH (ref 65–99)
POTASSIUM: 4.2 mmol/L (ref 3.5–5.1)
SODIUM: 139 mmol/L (ref 135–145)

## 2016-09-26 LAB — TROPONIN I
TROPONIN I: 0.04 ng/mL — AB (ref <0.03)
TROPONIN I: 0.05 ng/mL — AB (ref <0.03)
TROPONIN I: 0.05 ng/mL — AB (ref <0.03)
Troponin I: 0.05 ng/mL (ref <0.03)

## 2016-09-26 LAB — C DIFFICILE QUICK SCREEN W PCR REFLEX
C Diff antigen: NEGATIVE
C Diff interpretation: NOT DETECTED
C Diff toxin: NEGATIVE

## 2016-09-26 LAB — MRSA PCR SCREENING: MRSA by PCR: NEGATIVE

## 2016-09-26 LAB — BRAIN NATRIURETIC PEPTIDE: B NATRIURETIC PEPTIDE 5: 1570 pg/mL — AB (ref 0.0–100.0)

## 2016-09-26 MED ORDER — PENTAFLUOROPROP-TETRAFLUOROETH EX AERO
1.0000 "application " | INHALATION_SPRAY | CUTANEOUS | Status: DC | PRN
  Filled 2016-09-26: qty 30

## 2016-09-26 MED ORDER — ACETAMINOPHEN 500 MG PO TABS: 1000.0000 mg | ORAL_TABLET | Freq: Four times a day (QID) | ORAL | Status: DC | PRN

## 2016-09-26 MED ORDER — LIDOCAINE-PRILOCAINE 2.5-2.5 % EX CREA
1.0000 "application " | TOPICAL_CREAM | CUTANEOUS | Status: DC | PRN
  Filled 2016-09-26: qty 5

## 2016-09-26 MED ORDER — SODIUM CHLORIDE 0.9% FLUSH
3.0000 mL | Freq: Two times a day (BID) | INTRAVENOUS | Status: DC
  Administered 2016-09-26 – 2016-09-29 (×6): 3 mL via INTRAVENOUS

## 2016-09-26 MED ORDER — LABETALOL HCL 200 MG PO TABS
200.0000 mg | ORAL_TABLET | Freq: Two times a day (BID) | ORAL | Status: DC
  Administered 2016-09-26 – 2016-09-29 (×5): 200 mg via ORAL
  Filled 2016-09-26 (×6): qty 1

## 2016-09-26 MED ORDER — LIDOCAINE HCL (PF) 1 % IJ SOLN: 5.0000 mL | INTRAMUSCULAR | Status: DC | PRN

## 2016-09-26 MED ORDER — NEPRO/CARBSTEADY PO LIQD: 237.0000 mL | ORAL | Status: DC

## 2016-09-26 MED ORDER — HEPARIN SODIUM (PORCINE) 5000 UNIT/ML IJ SOLN
5000.0000 [IU] | Freq: Three times a day (TID) | INTRAMUSCULAR | Status: DC
  Administered 2016-09-26 – 2016-09-29 (×10): 5000 [IU] via SUBCUTANEOUS
  Filled 2016-09-26 (×8): qty 1

## 2016-09-26 MED ORDER — ONDANSETRON HCL 4 MG/2ML IJ SOLN: 4.0000 mg | Freq: Four times a day (QID) | INTRAMUSCULAR | Status: DC | PRN

## 2016-09-26 MED ORDER — SODIUM CHLORIDE 0.9 % IV SOLN
250.0000 mL | INTRAVENOUS | Status: DC | PRN
  Administered 2016-09-26: 250 mL via INTRAVENOUS

## 2016-09-26 MED ORDER — SODIUM CHLORIDE 0.9 % IV SOLN: 100.0000 mL | INTRAVENOUS | Status: DC | PRN

## 2016-09-26 MED ORDER — NITROGLYCERIN IN D5W 200-5 MCG/ML-% IV SOLN
5.0000 ug/min | Freq: Once | INTRAVENOUS | Status: AC
  Administered 2016-09-26: 5 ug/min via INTRAVENOUS
  Filled 2016-09-26: qty 250

## 2016-09-26 MED ORDER — HYDRALAZINE HCL 25 MG PO TABS
25.0000 mg | ORAL_TABLET | Freq: Four times a day (QID) | ORAL | Status: DC | PRN
  Administered 2016-09-27 (×2): 25 mg via ORAL
  Filled 2016-09-26 (×3): qty 1

## 2016-09-26 MED ORDER — ALTEPLASE 2 MG IJ SOLR
2.0000 mg | Freq: Once | INTRAMUSCULAR | Status: DC | PRN
  Filled 2016-09-26: qty 2

## 2016-09-26 MED ORDER — SIMVASTATIN 20 MG PO TABS
10.0000 mg | ORAL_TABLET | Freq: Every day | ORAL | Status: DC
  Administered 2016-09-26 – 2016-09-28 (×3): 10 mg via ORAL
  Filled 2016-09-26 (×3): qty 1

## 2016-09-26 MED ORDER — NEPRO/CARBSTEADY PO LIQD
237.0000 mL | ORAL | Status: DC
  Administered 2016-09-26 – 2016-09-27 (×2): 237 mL via ORAL

## 2016-09-26 MED ORDER — MORPHINE SULFATE (PF) 2 MG/ML IV SOLN: 2.0000 mg | INTRAVENOUS | Status: DC | PRN

## 2016-09-26 MED ORDER — SODIUM CHLORIDE 0.9% FLUSH: 3.0000 mL | INTRAVENOUS | Status: DC | PRN

## 2016-09-26 MED ORDER — ONDANSETRON HCL 4 MG PO TABS: 4.0000 mg | ORAL_TABLET | Freq: Four times a day (QID) | ORAL | Status: DC | PRN

## 2016-09-26 MED ORDER — ZOLPIDEM TARTRATE 5 MG PO TABS
5.0000 mg | ORAL_TABLET | Freq: Every evening | ORAL | Status: DC | PRN
  Administered 2016-09-26 – 2016-09-27 (×2): 5 mg via ORAL
  Filled 2016-09-26 (×2): qty 1

## 2016-09-26 MED ORDER — AMLODIPINE BESYLATE 5 MG PO TABS
5.0000 mg | ORAL_TABLET | Freq: Every day | ORAL | Status: DC
  Administered 2016-09-26: 5 mg via ORAL
  Filled 2016-09-26: qty 1

## 2016-09-26 MED ORDER — HEPARIN SODIUM (PORCINE) 1000 UNIT/ML DIALYSIS
20.0000 [IU]/kg | INTRAMUSCULAR | Status: DC | PRN
  Filled 2016-09-26: qty 2

## 2016-09-26 MED ORDER — HEPARIN SODIUM (PORCINE) 1000 UNIT/ML DIALYSIS
1000.0000 [IU] | INTRAMUSCULAR | Status: DC | PRN
  Filled 2016-09-26: qty 1

## 2016-09-26 NOTE — Progress Notes (Signed)
Nutrition Brief Note  Patient identified on the Malnutrition Screening Tool (MST) Report  Wt Readings from Last 15 Encounters:  09/26/16 173 lb (78.5 kg)  09/24/15 172 lb (78 kg)  11/05/14 174 lb (78.9 kg)  04/02/14 170 lb (77.1 kg)  11/23/13 172 lb (78 kg)  11/13/13 172 lb 11.2 oz (78.3 kg)  08/07/13 174 lb 12.8 oz (79.3 kg)  03/09/13 173 lb (78.5 kg)  03/04/13 175 lb (79.4 kg)  12/03/12 174 lb (78.9 kg)  11/07/12 167 lb 5.3 oz (75.9 kg)  11/03/12 176 lb (79.8 kg)  10/15/12 171 lb 9.6 oz (77.8 kg)  05/08/12 177 lb 3.2 oz (80.4 kg)  03/27/12 178 lb (80.7 kg)    Body mass index is 29.7 kg/m. Patient meets criteria for Overweight based on current BMI.   Pt reports that her weight was 210 lbs when she went on dialysis 4 years ago. She says she lost to ~173 lbs soon after. Chart review indicates an extremely stable weight and if pt has lost weight it has been in the distant past.   RD asked about renal diet. She says she has a paper she follows regarding what she can and can not eat. She denies any questions at this time. RD mentioned that the dialysis center will have an RD available for contact if she does want to ask nutrition questions.   Typically she receives a protein bar with dialysis sessions. She was agreeable to nepro.   Current diet order is Heart Healthy. There is no documented meal intake as of yet.  No nutrition interventions warranted at this time. If nutrition issues arise, please consult RD.   Christophe Louis RD, LDN, CNSC Clinical Nutrition Pager: [phone] 09/26/2016 12:25 PM

## 2016-09-26 NOTE — H&P (Signed)
TRH H&P    Patient Demographics:    Patricia Ayala, is a 82 y.o. female  MRN: [id number]  DOB - 1929-04-20  Admit Date - 09/26/2016  Referring MD/NP/PA: Dr. Wilkie Aye  Outpatient Primary MD for the patient is [name]., MD  Patient coming from: Home  Chief Complaint  Patient presents with  . Shortness of Breath      HPI:    Patricia Ayala  is a 82 y.o. female, With a history of ESRD on hemodialysis Tuesday Thursday and Saturday came to the ED with worsening shortness of breath since this morning. He shouldn't says that usually sleeps with 2 pillows for past 1 year, this morning she noticed she was more short of breath than usual also having generalized pain involving both sides of chest and upper abdomen. She denies coughing up phlegm but has been coughing. No nausea vomiting or diarrhea. No constipation. No dysuria. Patient still makes urine twice a day. She got dialyzed yesterday.  In the ED chest x-ray revealed pulmonary edema, BNP significantly elevated to 1570. Troponin 0.04 Patient started on nitroglycerin infusion, for hypertensive urgency as well as pulmonary edema. Patient will be placed under observation and stepdown unit. Nephrology has been consulted or possible hemodialysis today.    Review of systems:    In addition to the HPI above,  No Fever-chills, No Headache, No changes with Vision or hearing, No problems swallowing food or Liquids, No Abdominal pain, No Nausea or Vomiting, bowel movements are regular, No Blood in stool or Urine, No dysuria, No new skin rashes or bruises, No new joints pains-aches,  No new weakness, tingling, numbness in any extremity, No recent weight gain or loss, No polyuria, polydypsia or polyphagia, No significant Mental Stressors.  A full 10 point Review of Systems was done, except as stated above, all other Review of Systems were negative.   With  Past History of the following :    Past Medical History:  Diagnosis Date  . Anemia 02/11/2012  . Anxiety   . Chronic kidney disease    not on dialysis yet, Tues, thurs, sat  . H/O metabolic acidosis   . Hyperparathyroidism, secondary (HCC)   . Hypertension    Dr. Regino Schultze, [place], [place]  . Vitamin D deficiency       Past Surgical History:  Procedure Laterality Date  . ABDOMINAL HYSTERECTOMY    . AV FISTULA PLACEMENT  04/01/2012   Procedure: ARTERIOVENOUS (AV) FISTULA CREATION;  Surgeon: Sherren Kerns, MD;  Location: [facility] OR;  Service: Vascular;  Laterality: Left;  . CHOLECYSTECTOMY    . COLON SURGERY     for a blockage  . EYE SURGERY     bilateral cataracts, /w IOL - 2013  . FISTULOGRAM N/A 11/05/2014   Procedure: FISTULOGRAM;  Surgeon: Larina Earthly, MD;  Location: [facility] C[facility] LAB;  Service: Cardiovascular;  Laterality: N/A;  . INSERTION OF DIALYSIS CATHETER  04/10/2012   Procedure: INSERTION OF DIALYSIS CATHETER;  Surgeon: Pryor Ochoa, MD;  Location: [facility] OR;  Service: Vascular;  Laterality:  N/A;  Insertion Diatek Catheter Right Internal Jugular  . LIGATION OF COMPETING BRANCHES OF ARTERIOVENOUS FISTULA  11/07/2012   Procedure: LIGATION OF COMPETING BRANCHES OF ARTERIOVENOUS FISTULA;  Surgeon: Chuck Hint, MD;  Location: [facility] OR;  Service: Vascular;  Laterality: Left;  Brachio/Cephalic Fistula  . SHUNTOGRAM N/A 11/03/2012   Procedure: Betsey Amen;  Surgeon: Chuck Hint, MD;  Location: [facility] CATH LAB;  Service: Cardiovascular;  Laterality: N/A;  . SHUNTOGRAM Left 03/09/2013   Procedure: Fistulogram;  Surgeon: Fransisco Hertz, MD;  Location: [facility] CATH LAB;  Service: Cardiovascular;  Laterality: Left;  . SHUNTOGRAM Left 11/23/2013   Procedure: FISTULOGRAM;  Surgeon: Fransisco Hertz, MD;  Location: [facility] CATH LAB;  Service: Cardiovascular;  Laterality: Left;      Social History:      Social History  Substance Use Topics  . Smoking status: Never Smoker  . Smokeless tobacco: Never  Used  . Alcohol use No       Family History :     Family History  Problem Relation Age of Onset  . Stroke Mother   . Cancer Father   . Heart attack Father       Home Medications:   Prior to Admission medications   Medication Sig Start Date End Date Taking? Authorizing Provider  acetaminophen (TYLENOL) 500 MG tablet Take 1,000 mg by mouth every 6 (six) hours as needed for moderate pain.    Historical Provider, MD  amLODipine (NORVASC) 5 MG tablet Take 5 mg by mouth daily.    Historical Provider, MD  BIOTIN PO Take 1,000 mg by mouth daily.     Historical Provider, MD  labetalol (NORMODYNE) 200 MG tablet Take 200 mg by mouth 2 (two) times daily.     Historical Provider, MD  lidocaine-prilocaine (EMLA) cream Apply 1 application topically as needed (for IV access).  01/12/13   Historical Provider, MD  multivitamin (RENA-VIT) TABS tablet Take 1 tablet by mouth daily.    Historical Provider, MD  promethazine (PHENERGAN) 25 MG tablet Take 25 mg by mouth every 6 (six) hours as needed for nausea or vomiting.    Historical Provider, MD  simvastatin (ZOCOR) 10 MG tablet Take 10 mg by mouth at bedtime.  02/06/13   Historical Provider, MD  zolpidem (AMBIEN) 10 MG tablet Take 10 mg by mouth at bedtime as needed for sleep.  11/10/13   Historical Provider, MD     Allergies:    No Known Allergies   Physical Exam:   Vitals  Blood pressure 179/78, pulse 66, temperature 98.4 F (36.9 C), temperature source Oral, resp. rate 24, height 5\' 4"  (1.626 m), weight 78.5 kg (173 lb), SpO2 99 %.  1.  General: African-American female in no acute distress  2. Psychiatric:  Intact judgement and  insight, awake alert, oriented x 3.  3. Neurologic: No focal neurological deficits, all cranial nerves intact.Strength 5/5 all 4 extremities, sensation intact all 4 extremities, plantars down going.  4. Eyes :  anicteric sclerae, moist conjunctivae with no lid lag. PERRLA.  5. ENMT:  Oropharynx clear with  moist mucous membranes and good dentition  6. Neck:  supple, no cervical lymphadenopathy appriciated, No thyromegaly  7. Respiratory : Normal respiratory effort, decreased breath sounds bilaterally  8. Cardiovascular : RRR, no gallops, rubs or murmurs, trace leg edema  9. Gastrointestinal:  Positive bowel sounds, abdomen soft, non-tender to palpation,no hepatosplenomegaly, no rigidity or guarding       10. Skin:  No cyanosis, normal  texture and turgor, no rash, lesions or ulcers  11.Musculoskeletal:  Good muscle tone,  joints appear normal , no effusions,  normal range of motion    Data Review:    CBC  Recent Labs Lab 09/26/16 0629  WBC 10.7*  HGB 10.7*  HCT 34.7*  PLT 270  MCV 98.0  MCH 30.2  MCHC 30.8  RDW 13.8  LYMPHSABS 1.5  MONOABS 1.0  EOSABS 0.3  BASOSABS 0.1   ------------------------------------------------------------------------------------------------------------------  Chemistries   Recent Labs Lab 09/26/16 0629  NA 139  K 4.2  CL 100*  CO2 28  GLUCOSE 150*  BUN 32*  CREATININE 5.69*  CALCIUM 9.9   ------------------------------------------------------------------------------------------------------------------  --------------------------------------------------------Cardiac Enzymes:  Recent Labs Lab 09/26/16 0629  TROPONINI 0.04*    --------------------------------------------------------------------------------------------------------------- Urine analysis:    Component Value Date/Time   COLORURINE YELLOW 09/24/2015 1341   APPEARANCEUR CLEAR 09/24/2015 1341   LABSPEC 1.010 09/24/2015 1341   PHURINE 8.0 09/24/2015 1341   GLUCOSEU 100 (A) 09/24/2015 1341   HGBUR MODERATE (A) 09/24/2015 1341   BILIRUBINUR NEGATIVE 09/24/2015 1341   KETONESUR NEGATIVE 09/24/2015 1341   PROTEINUR 100 (A) 09/24/2015 1341   NITRITE NEGATIVE 09/24/2015 1341   LEUKOCYTESUR NEGATIVE 09/24/2015 1341      Imaging Results:    Dg Chest Port 1  View  Result Date: 09/26/2016 CLINICAL DATA:  Shortness of breath. EXAM: PORTABLE CHEST 1 VIEW COMPARISON:  Radiographs of August 22, 2016. FINDINGS: Stable cardiomegaly. Atherosclerosis of thoracic aorta is noted. Diffuse bilateral interstitial densities are noted concerning for pulmonary edema. No pneumothorax is noted. Bony thorax is unremarkable. IMPRESSION: Aortic atherosclerosis. Diffuse bilateral interstitial densities are noted most consistent with pulmonary edema. Electronically Signed   By: Lupita RaiderJames  Green Jr, M.D.   On: 09/26/2016 07:02    My personal review of EKG: Rhythm NSR, LVH   Assessment & Plan:    Active Problems:   End stage renal disease (HCC)   Pulmonary edema   1. Pulmonary edema- Chest x-ray showed pulmonary edema, patient started on nitroglycerin infusion. We'll place in stepdown unit, probably will require hemodialysis. Nephrology has been consulted. 2. Congestive heart failure- patient's BNP significantly elevated 1570, never had echocardiogram in the past. Will check echocardiogram to rule out underlying diastolic heart failure. 3. ESRD on hemodialysis- patient is on hemodialysis Tuesday Thursday and Saturday. Last dialysis was yesterday. Nephrology has been consulted. 4. Mild elevation of troponin- troponin mildly elevated 0.04, likely from demand ischemia. EKG shows only LVH changes. We'll cycle troponin every 6 hours 3.   DVT Prophylaxis-  heparin  AM Labs Ordered, also please review Full Orders  Family Communication: Admission, patients condition and plan of care including tests being ordered have been discussed with the patient and her husband and daughter at bedside who indicate understanding and agree with the plan and Code Status.  Code Status: Full code  Admission status: Observation    Time spent in minutes : 60 minutes   Kester Stimpson S M.D on 09/26/2016 at 8:15 AM  Between 7am to 7pm - Pager - [phone]. After 7pm go to [email or web address] -  password [email or web address]  [facility] - Office  [phone]

## 2016-09-26 NOTE — ED Provider Notes (Signed)
[facility]-EMERGENCY DEPT Provider Note   CSN: [id number] Arrival date & time: 09/26/16  0603     History   Chief Complaint Chief Complaint  Patient presents with  . Shortness of Breath    HPI Patricia Ayala is a 82 y.o. female.  HPI  This is an 82 year old female with history of end-stage renal disease on dialysis Tuesday, Thursday, Saturday who presents with shortness of breath. Patient reports acute onset shortness of breath upon awakening this morning. She states that she felt fine when she went to bed last night. She reports a nonproductive cough. No fevers. Shortness of breath is worse with laying flat. She last dialyzed yesterday and reports a full dialysis session. Reports shoulder pain. Denies any chest pain. A slums lower extremity swelling.  Past Medical History:  Diagnosis Date  . Anemia 02/11/2012  . Anxiety   . Chronic kidney disease    not on dialysis yet, Tues, thurs, sat  . H/O metabolic acidosis   . Hyperparathyroidism, secondary (HCC)   . Hypertension    Dr. Regino Schultze, [place], [place]  . Vitamin D deficiency     Patient Active Problem List   Diagnosis Date Noted  . Mechanical complication of other vascular device, implant, and graft 08/07/2013  . End stage renal disease (HCC) 03/27/2012  . Anemia 02/11/2012    Past Surgical History:  Procedure Laterality Date  . ABDOMINAL HYSTERECTOMY    . AV FISTULA PLACEMENT  04/01/2012   Procedure: ARTERIOVENOUS (AV) FISTULA CREATION;  Surgeon: Sherren Kerns, MD;  Location: [facility] OR;  Service: Vascular;  Laterality: Left;  . CHOLECYSTECTOMY    . COLON SURGERY     for a blockage  . EYE SURGERY     bilateral cataracts, /w IOL - 2013  . FISTULOGRAM N/A 11/05/2014   Procedure: FISTULOGRAM;  Surgeon: Larina Earthly, MD;  Location: [facility] CATH LAB;  Service: Cardiovascular;  Laterality: N/A;  . INSERTION OF DIALYSIS CATHETER  04/10/2012   Procedure: INSERTION OF DIALYSIS CATHETER;  Surgeon: Pryor Ochoa, MD;  Location: [facility] OR;   Service: Vascular;  Laterality: N/A;  Insertion Diatek Catheter Right Internal Jugular  . LIGATION OF COMPETING BRANCHES OF ARTERIOVENOUS FISTULA  11/07/2012   Procedure: LIGATION OF COMPETING BRANCHES OF ARTERIOVENOUS FISTULA;  Surgeon: Chuck Hint, MD;  Location: [facility] OR;  Service: Vascular;  Laterality: Left;  Brachio/Cephalic Fistula  . SHUNTOGRAM N/A 11/03/2012   Procedure: Betsey Amen;  Surgeon: Chuck Hint, MD;  Location: [facility] CATH LAB;  Service: Cardiovascular;  Laterality: N/A;  . SHUNTOGRAM Left 03/09/2013   Procedure: Fistulogram;  Surgeon: Fransisco Hertz, MD;  Location: [facility] CATH LAB;  Service: Cardiovascular;  Laterality: Left;  . SHUNTOGRAM Left 11/23/2013   Procedure: FISTULOGRAM;  Surgeon: Fransisco Hertz, MD;  Location: [facility] CATH LAB;  Service: Cardiovascular;  Laterality: Left;    OB History    No data available       Home Medications    Prior to Admission medications   Medication Sig Start Date End Date Taking? Authorizing Provider  acetaminophen (TYLENOL) 500 MG tablet Take 1,000 mg by mouth every 6 (six) hours as needed for moderate pain.    Historical Provider, MD  amLODipine (NORVASC) 5 MG tablet Take 5 mg by mouth daily.    Historical Provider, MD  BIOTIN PO Take 1,000 mg by mouth daily.     Historical Provider, MD  labetalol (NORMODYNE) 200 MG tablet Take 200 mg by mouth 2 (two) times daily.  Historical Provider, MD  lidocaine-prilocaine (EMLA) cream Apply 1 application topically as needed (for IV access).  01/12/13   Historical Provider, MD  multivitamin (RENA-VIT) TABS tablet Take 1 tablet by mouth daily.    Historical Provider, MD  promethazine (PHENERGAN) 25 MG tablet Take 25 mg by mouth every 6 (six) hours as needed for nausea or vomiting.    Historical Provider, MD  simvastatin (ZOCOR) 10 MG tablet Take 10 mg by mouth at bedtime.  02/06/13   Historical Provider, MD  zolpidem (AMBIEN) 10 MG tablet Take 10 mg by mouth at bedtime as needed for sleep.   11/10/13   Historical Provider, MD    Family History Family History  Problem Relation Age of Onset  . Stroke Mother   . Cancer Father   . Heart attack Father     Social History Social History  Substance Use Topics  . Smoking status: Never Smoker  . Smokeless tobacco: Never Used  . Alcohol use No     Allergies   Patient has no known allergies.   Review of Systems Review of Systems  Constitutional: Negative for fever.  Respiratory: Positive for cough and shortness of breath. Negative for wheezing.   Cardiovascular: Positive for leg swelling. Negative for chest pain.  Gastrointestinal: Negative for abdominal pain, nausea and vomiting.  All other systems reviewed and are negative.    Physical Exam Updated Vital Signs BP (!) 206/74   Pulse 71   Temp 98.4 F (36.9 C) (Oral)   Resp 25   Ht 5\' 4"  (1.626 m)   Wt 173 lb (78.5 kg)   SpO2 97%   BMI 29.70 kg/m   Physical Exam  Constitutional: She is oriented to person, place, and time. She appears well-developed and well-nourished.  Overweight, tachypnea, speaking in short sentences  HENT:  Head: Normocephalic and atraumatic.  Cardiovascular: Normal rate, regular rhythm and normal heart sounds.   No murmur heard. Pulmonary/Chest: She has no wheezes.  Tachypnea, speaking in short sentences, shallow air movement, diminished breath sounds left upper lobe, no wheezing or crackles noted  Abdominal: Soft. Bowel sounds are normal. There is no tenderness. There is no guarding.  Musculoskeletal: She exhibits edema.  Trace bilateral lower extremity edema  Neurological: She is alert and oriented to person, place, and time.  Skin: Skin is warm and dry.  Psychiatric: She has a normal mood and affect.  Nursing note and vitals reviewed.    ED Treatments / Results  Labs (all labs ordered are listed, but only abnormal results are displayed) Labs Reviewed  CBC WITH DIFFERENTIAL/PLATELET - Abnormal; Notable for the following:        Result Value   WBC 10.7 (*)    RBC 3.54 (*)    Hemoglobin 10.7 (*)    HCT 34.7 (*)    Neutro Abs 7.9 (*)    All other components within normal limits  BRAIN NATRIURETIC PEPTIDE - Abnormal; Notable for the following:    B Natriuretic Peptide 1,570.0 (*)    All other components within normal limits  BASIC METABOLIC PANEL - Abnormal; Notable for the following:    Chloride 100 (*)    Glucose, Bld 150 (*)    BUN 32 (*)    Creatinine, Ser 5.69 (*)    GFR calc non Af Amer 6 (*)    GFR calc Af Amer 7 (*)    All other components within normal limits  TROPONIN I - Abnormal; Notable for the following:  Troponin I 0.04 (*)    All other components within normal limits    EKG  EKG Interpretation  Date/Time:  Wednesday September 26 2016 06:20:07 EST Ventricular Rate:  69 PR Interval:    QRS Duration: 96 QT Interval:  474 QTC Calculation: 508 R Axis:   19 Text Interpretation:  Sinus rhythm LVH with secondary repolarization abnormality Prolonged QT interval Confirmed by Wilkie AyeHORTON  MD, Adisyn Ruscitti ([id number]) on 09/26/2016 6:25:23 AM       Radiology Dg Chest Port 1 View  Result Date: 09/26/2016 CLINICAL DATA:  Shortness of breath. EXAM: PORTABLE CHEST 1 VIEW COMPARISON:  Radiographs of August 22, 2016. FINDINGS: Stable cardiomegaly. Atherosclerosis of thoracic aorta is noted. Diffuse bilateral interstitial densities are noted concerning for pulmonary edema. No pneumothorax is noted. Bony thorax is unremarkable. IMPRESSION: Aortic atherosclerosis. Diffuse bilateral interstitial densities are noted most consistent with pulmonary edema. Electronically Signed   By: Lupita RaiderJames  Green Jr, M.D.   On: 09/26/2016 07:02    Procedures Procedures (including critical care time)  CRITICAL CARE Performed by: Shon BatonHORTON, Najae Filsaime F   Total critical care time: 30 minutes  Critical care time was exclusive of separately billable procedures and treating other patients.  Critical care was necessary to treat or  prevent imminent or life-threatening deterioration.  Critical care was time spent personally by me on the following activities: development of treatment plan with patient and/or surrogate as well as nursing, discussions with consultants, evaluation of patient's response to treatment, examination of patient, obtaining history from patient or surrogate, ordering and performing treatments and interventions, ordering and review of laboratory studies, ordering and review of radiographic studies, pulse oximetry and re-evaluation of patient's condition.   Medications Ordered in ED Medications - No data to display   Initial Impression / Assessment and Plan / ED Course  I have reviewed the triage vital signs and the nursing notes.  Pertinent labs & imaging results that were available during my care of the patient were reviewed by me and considered in my medical decision making (see chart for details).  Clinical Course     Patient presents with shortness of breath. Fairly acute in onset.  Endorses orthopnea. Reports full dialysis yesterday. No history of CHF. No echo on chart. Denies chest pain. EKG is nonischemic. Patient placed on supplemental oxygen. She may require BiPAP for workup breathing. Lab work obtained. Chest x-ray concerning for pulmonary edema. On recheck, patient reports improvement of symptoms with supplemental oxygen; however, she still is tachypnea With shallow respirations. BNP is elevated. Troponin is at its baseline. Given chest x-ray, patient was placed on a nitroglycerin drip for afterload reduction.  Discussed with Dr. Wolfgang PhoenixBhutani regarding my concerns that the patient may need an extra dialysis session. He will evaluate the patient. She may also need a formal echocardiogram to evaluate cardiac function. Given oxygen requirement, need for dialysis and acute respiratory compromise, will admit to the hospital for further management.    Final Clinical Impressions(s) / ED Diagnoses    Final diagnoses:  Acute pulmonary edema (HCC)  Hypoxia    New Prescriptions New Prescriptions   No medications on file     Shon Batonourtney F Catelyn Friel, MD 09/26/16 [phone]

## 2016-09-26 NOTE — ED Notes (Signed)
CRITICAL VALUE ALERT  Critical value received:  Troponin 0.04  Date of notification:  09/26/16  Time of notification:  0710  Critical value read back:Yes.    Nurse who received alert:  c Cordell Coke rn:  Responding MD:  Dr. Wilkie Aye  Time MD responded:  [phone]

## 2016-09-26 NOTE — Consult Note (Signed)
Patricia Ayala MRN: [id number] DOB/AGE: 80-Oct-1930 80 y.o. Primary Care Physician:FUSCO,LAWRENCE J., MD Admit date: 09/26/2016 Chief Complaint:  Chief Complaint  Patient presents with  . Shortness of Breath   HPI: Pt is a 82 year old  female with a past medical history of ESRD who came to the ER with  C/o  shortness of breath .  HPI dates back to  this morning when pt had sudden onset of dyspnea. Py also  says that she usually sleeps with 2 pillows but this morning she noticed she was more short of breath than usual also having generalized pain involving both sides of chest and upper abdomen.  Pt c/o coughing but non productive  No nausea vomiting or diarrhea.  No constipation. No dysuria.  Up  On evaluation in ER chest x-ray showed pulmonary edema and BNP  Was  elevated as well. Pt was admitted for further care and Nephrology was consulted. Pt seen in ICU. Pt daughters are also present in the room. Pt main concern continues to be shortness of breath.    Past Medical History:  Diagnosis Date  . Anemia 02/11/2012  . Anxiety   . Chronic kidney disease    not on dialysis yet, Tues, thurs, sat  . H/O metabolic acidosis   . Hyperparathyroidism, secondary (HCC)   . Hypertension    Dr. Regino Schultze, [place], [place]  . Vitamin D deficiency         Family History  Problem Relation Age of Onset  . Stroke Mother   . Cancer Father   . Heart attack Father     Social History:  reports that she has never smoked. She has never used smokeless tobacco. She reports that she does not drink alcohol or use drugs.   Allergies: No Known Allergies   (Not in a hospital admission)      EPP:IRJJO from the symptoms mentioned above,there are no other symptoms referable to all systems reviewed.  Physical Exam: Vital signs in last 24 hours: Temp:  [98.4 F (36.9 C)] 98.4 F (36.9 C) (11/29 0640) Pulse Rate:  [71] 71 (11/29 0640) Resp:  [25] 25 (11/29 0640) BP: (206)/(74) 206/74 (11/29  0640) SpO2:  [97 %] 97 % (11/29 0640) Weight:  [173 lb (78.5 kg)] 173 lb (78.5 kg) (11/29 0610) Weight change:     Intake/Output from previous day: No intake/output data recorded. No intake/output data recorded.   Physical Exam: General- pt is awake,alert, oriented to time place and person Resp- No acute REsp distress, Rhonchi+ CVS- S1S2 regular in rate and rhythm GIT- BS+, soft, NT, ND EXT- NO LE Edema, Cyanosis CNS- CN 2-12 grossly intact. Moving all 4 extremities Psych- normal mod and affect Access- AVF   Lab Results: CBC  Recent Labs  09/26/16 0629  WBC 10.7*  HGB 10.7*  HCT 34.7*  PLT 270    BMET  Recent Labs  09/26/16 0629  NA 139  K 4.2  CL 100*  CO2 28  GLUCOSE 150*  BUN 32*  CREATININE 5.69*  CALCIUM 9.9    MICRO No results found for this or any previous visit (from the past 240 hour(s)).    Lab Results  Component Value Date   CALCIUM 9.9 09/26/2016   CAION 1.07 (L) 11/05/2014   PHOS 5.9 (H) 12/25/2011   CXR showed  Diffuse bilateral interstitial densities are noted most consistent with pulmonary edema.  Impression: 1)Renal  ESRD on HD  Pt is on Tue/Thurs/saturday schedule                Pt was dialyzed yesterday but comes in with fluid overload                Pt will be dialyzed today   2)HTN BP not at goal  on NTG drip Hd should help   3)Anemia In ESRD the goal for HGb is 9--11. Hgb at goal Will keep on epo  4)CKD Mineral-Bone Disorder Phosphorus on higher side. Calcium is  at goal.  5)CHF- Pt admitted with pulmonary edemaPMD following  6)Electrolytes  Normokalemic NOrmonatremic   7)Acid base Co2 at goal     Plan:  Will dialyze today Will try to take 2.5 liters off      Patricia Ayala S 09/26/2016, 7:20 AM

## 2016-09-26 NOTE — Progress Notes (Signed)
*  PRELIMINARY RESULTS* Echocardiogram 2D Echocardiogram has been performed.  Jeryl Columbia 09/26/2016, 3:10 PM

## 2016-09-26 NOTE — ED Triage Notes (Signed)
Pt c/o sob that started earlier tonight

## 2016-09-27 DIAGNOSIS — Z823 Family history of stroke: Secondary | ICD-10-CM | POA: Diagnosis not present

## 2016-09-27 DIAGNOSIS — Z992 Dependence on renal dialysis: Secondary | ICD-10-CM | POA: Diagnosis not present

## 2016-09-27 DIAGNOSIS — R0902 Hypoxemia: Secondary | ICD-10-CM | POA: Diagnosis not present

## 2016-09-27 DIAGNOSIS — I248 Other forms of acute ischemic heart disease: Secondary | ICD-10-CM | POA: Diagnosis not present

## 2016-09-27 DIAGNOSIS — Z8249 Family history of ischemic heart disease and other diseases of the circulatory system: Secondary | ICD-10-CM | POA: Diagnosis not present

## 2016-09-27 DIAGNOSIS — G629 Polyneuropathy, unspecified: Secondary | ICD-10-CM | POA: Diagnosis not present

## 2016-09-27 DIAGNOSIS — D631 Anemia in chronic kidney disease: Secondary | ICD-10-CM | POA: Diagnosis not present

## 2016-09-27 DIAGNOSIS — Z79899 Other long term (current) drug therapy: Secondary | ICD-10-CM | POA: Diagnosis not present

## 2016-09-27 DIAGNOSIS — I5030 Unspecified diastolic (congestive) heart failure: Secondary | ICD-10-CM | POA: Diagnosis not present

## 2016-09-27 DIAGNOSIS — I132 Hypertensive heart and chronic kidney disease with heart failure and with stage 5 chronic kidney disease, or end stage renal disease: Secondary | ICD-10-CM | POA: Diagnosis not present

## 2016-09-27 DIAGNOSIS — M899 Disorder of bone, unspecified: Secondary | ICD-10-CM | POA: Diagnosis not present

## 2016-09-27 DIAGNOSIS — Z809 Family history of malignant neoplasm, unspecified: Secondary | ICD-10-CM | POA: Diagnosis not present

## 2016-09-27 DIAGNOSIS — N2581 Secondary hyperparathyroidism of renal origin: Secondary | ICD-10-CM | POA: Diagnosis not present

## 2016-09-27 DIAGNOSIS — E559 Vitamin D deficiency, unspecified: Secondary | ICD-10-CM | POA: Diagnosis not present

## 2016-09-27 DIAGNOSIS — N186 End stage renal disease: Secondary | ICD-10-CM | POA: Diagnosis not present

## 2016-09-27 DIAGNOSIS — I160 Hypertensive urgency: Secondary | ICD-10-CM | POA: Diagnosis not present

## 2016-09-27 DIAGNOSIS — M898X9 Other specified disorders of bone, unspecified site: Secondary | ICD-10-CM | POA: Diagnosis not present

## 2016-09-27 LAB — CBC
HCT: 32.2 % — ABNORMAL LOW (ref 36.0–46.0)
Hemoglobin: 10 g/dL — ABNORMAL LOW (ref 12.0–15.0)
MCH: 30.4 pg (ref 26.0–34.0)
MCHC: 31.1 g/dL (ref 30.0–36.0)
MCV: 97.9 fL (ref 78.0–100.0)
PLATELETS: 233 10*3/uL (ref 150–400)
RBC: 3.29 MIL/uL — AB (ref 3.87–5.11)
RDW: 13.7 % (ref 11.5–15.5)
WBC: 10.4 10*3/uL (ref 4.0–10.5)

## 2016-09-27 LAB — COMPREHENSIVE METABOLIC PANEL
ALT: 16 U/L (ref 14–54)
AST: 17 U/L (ref 15–41)
Albumin: 3.3 g/dL — ABNORMAL LOW (ref 3.5–5.0)
Alkaline Phosphatase: 42 U/L (ref 38–126)
Anion gap: 9 (ref 5–15)
BUN: 16 mg/dL (ref 6–20)
CHLORIDE: 99 mmol/L — AB (ref 101–111)
CO2: 30 mmol/L (ref 22–32)
Calcium: 9.1 mg/dL (ref 8.9–10.3)
Creatinine, Ser: 3.64 mg/dL — ABNORMAL HIGH (ref 0.44–1.00)
GFR, EST AFRICAN AMERICAN: 12 mL/min — AB (ref >60)
GFR, EST NON AFRICAN AMERICAN: 10 mL/min — AB (ref >60)
Glucose, Bld: 116 mg/dL — ABNORMAL HIGH (ref 65–99)
POTASSIUM: 3.8 mmol/L (ref 3.5–5.1)
SODIUM: 138 mmol/L (ref 135–145)
Total Bilirubin: 0.5 mg/dL (ref 0.3–1.2)
Total Protein: 6.4 g/dL — ABNORMAL LOW (ref 6.5–8.1)

## 2016-09-27 LAB — HEPATITIS B SURFACE ANTIGEN: Hepatitis B Surface Ag: NEGATIVE

## 2016-09-27 MED ORDER — ALPRAZOLAM 0.5 MG PO TABS
0.5000 mg | ORAL_TABLET | Freq: Three times a day (TID) | ORAL | Status: DC | PRN
  Administered 2016-09-27 – 2016-09-28 (×2): 0.5 mg via ORAL
  Filled 2016-09-27: qty 1
  Filled 2016-09-27: qty 2

## 2016-09-27 MED ORDER — AMLODIPINE BESYLATE 5 MG PO TABS
10.0000 mg | ORAL_TABLET | Freq: Every day | ORAL | Status: DC
  Administered 2016-09-27 – 2016-09-29 (×2): 10 mg via ORAL
  Filled 2016-09-27 (×2): qty 2

## 2016-09-27 NOTE — Progress Notes (Signed)
[facility]  PROGRESS NOTE  Patricia Ayala ZOX:[id number] DOB: 12-20-1928 DOA: 09/26/2016 PCP: Cassell SmilesFUSCO,LAWRENCE J., MD   Brief HPI:    82 y.o. female, With a history of ESRD on hemodialysis Tuesday Thursday and Saturday came to the ED with worsening shortness of breath since this morning. He shouldn't says that usually sleeps with 2 pillows for past 1 year, this morning she noticed she was more short of breath than usual also having generalized pain involving both sides of chest and upper abdomen. She denies coughing up phlegm but has been coughing. No nausea vomiting or diarrhea. No constipation. No dysuria. Patient still makes urine twice a day. She got dialyzed yesterday.   Subjective   Patient seen and examined, breathing has improved, she is off nitro drip. Echocardiogram showed diastolic dysfunction, with EF 81-19%55-60%.   Assessment/Plan:     1. Pulmonary edema- improved after hemodialysis and nitroglycerin. Nitroglycerin has been tapered off.  Patient will require another hemodialysis session as per Dr. Kristian CoveyBefekadu. 2. Hypertension- patient is off nitro drip, blood pressure slightly elevated. Continue amlodipine, labetalol,  hydralazine when necessary. 3. Diastolic heart failure- echocardiogram shows EF 55-60% with diastolic dysfunction, slowly improving with hemodialysis. 4. ESRD on hemodialysis- patient is a minimal dialysis Tuesday Thursday and Saturday. Last dialysis was yesterday. Next dialysis session on 09/28/2016. 5. Mild elevation of troponin- troponin is mildly elevated to 0.05, likely from demand ischemia.    DVT prophylaxis: Heparin  Code Status: Full code  Family Communication: Discussed with patient's daughter on phone  Disposition Plan: Home when medically stable   Consultants:  Nephrology  Procedures:  None  Continuous infusions KVO    Antibiotics:   Anti-infectives    None       Objective   Vitals:   09/27/16 0715 09/27/16 0800  09/27/16 1003 09/27/16 1123  BP:  (!) 179/66 (!) 160/58   Pulse: 64 69 65 65  Resp: (!) 27 19  (!) 24  Temp: 98.5 F (36.9 C)   98.4 F (36.9 C)  TempSrc: Oral   Oral  SpO2: 99% 98%  98%  Weight:      Height:        Intake/Output Summary (Last 24 hours) at 09/27/16 1602 Last data filed at 09/27/16 0853  Gross per 24 hour  Intake           611.67 ml  Output             2500 ml  Net         -1888.33 ml   Filed Weights   09/26/16 1000 09/26/16 1845 09/27/16 0500  Weight: 78.7 kg (173 lb 8 oz) 78.7 kg (173 lb 8 oz) 76.5 kg (168 lb 10.4 oz)     Physical Examination:  General exam: Appears calm and comfortable. Respiratory system:  Bibasilar crackles Cardiovascular system:  RRR. No  murmurs, rubs, gallops. 2+ pitting edema GI system: Abdomen is nondistended, soft and nontender. No organomegaly.  Central nervous system. No focal neurological deficits. 5 x 5 power in all extremities. Skin: No rashes, lesions or ulcers. Psychiatry: Alert, oriented x 3.Judgement and insight appear normal. Affect normal.    Data Reviewed: I have personally reviewed following labs and imaging studies  CBG: No results for input(s): GLUCAP in the last 168 hours.  CBC:  Recent Labs Lab 09/26/16 0629 09/26/16 1057 09/27/16 0419  WBC 10.7* 11.4* 10.4  NEUTROABS 7.9*  --   --   HGB 10.7* 10.2* 10.0*  HCT 34.7*  32.9* 32.2*  MCV 98.0 97.9 97.9  PLT 270 240 233    Basic Metabolic Panel:  Recent Labs Lab 09/26/16 0629 09/26/16 0947 09/27/16 0419  NA 139 139 138  K 4.2 4.2 3.8  CL 100* 100* 99*  CO2 28 28 30   GLUCOSE 150* 99 116*  BUN 32* 33* 16  CREATININE 5.69* 5.82* 3.64*  CALCIUM 9.9 9.6 9.1  PHOS  --  4.9*  --     Recent Results (from the past 240 hour(s))  MRSA PCR Screening     Status: None   Collection Time: 09/26/16  9:30 AM  Result Value Ref Range Status   MRSA by PCR NEGATIVE NEGATIVE Final    Comment:        The GeneXpert MRSA Assay (FDA approved for NASAL  specimens only), is one component of a comprehensive MRSA colonization surveillance program. It is not intended to diagnose MRSA infection nor to guide or monitor treatment for MRSA infections.   C difficile quick scan w PCR reflex     Status: None   Collection Time: 09/26/16 12:50 PM  Result Value Ref Range Status   C Diff antigen NEGATIVE NEGATIVE Final   C Diff toxin NEGATIVE NEGATIVE Final   C Diff interpretation No C. difficile detected.  Final     Liver Function Tests:  Recent Labs Lab 09/26/16 0947 09/27/16 0419  AST  --  17  ALT  --  16  ALKPHOS  --  42  BILITOT  --  0.5  PROT  --  6.4*  ALBUMIN 3.5 3.3*   No results for input(s): LIPASE, AMYLASE in the last 168 hours. No results for input(s): AMMONIA in the last 168 hours.  Cardiac Enzymes:  Recent Labs Lab 09/26/16 0629 09/26/16 0947 09/26/16 1530 09/26/16 2119  TROPONINI 0.04* 0.05* 0.05* 0.05*   BNP (last 3 results)  Recent Labs  09/26/16 0629  BNP 1,570.0*    ProBNP (last 3 results) No results for input(s): PROBNP in the last 8760 hours.    Studies: Dg Chest Port 1 View  Result Date: 09/26/2016 CLINICAL DATA:  Shortness of breath. EXAM: PORTABLE CHEST 1 VIEW COMPARISON:  Radiographs of August 22, 2016. FINDINGS: Stable cardiomegaly. Atherosclerosis of thoracic aorta is noted. Diffuse bilateral interstitial densities are noted concerning for pulmonary edema. No pneumothorax is noted. Bony thorax is unremarkable. IMPRESSION: Aortic atherosclerosis. Diffuse bilateral interstitial densities are noted most consistent with pulmonary edema. Electronically Signed   By: Lupita Raider, M.D.   On: 09/26/2016 07:02    Scheduled Meds: . amLODipine  10 mg Oral Daily  . feeding supplement (NEPRO CARB STEADY)  237 mL Oral Q24H  . heparin  5,000 Units Subcutaneous Q8H  . labetalol  200 mg Oral BID  . simvastatin  10 mg Oral QHS  . sodium chloride flush  3 mL Intravenous Q12H      Time spent: 25  min  [facility]   [facility] Pager [phone]. If 7PM-7AM, please contact night-coverage at [email or web address], Office  [phone]  password [email or web address] 09/27/2016, 4:02 PM  LOS: 1 day

## 2016-09-27 NOTE — Care Management Obs Status (Signed)
MEDICARE OBSERVATION STATUS NOTIFICATION   Patient Details  Name: Patricia Ayala MRN: [id number] Date of Birth: 09/25/1929   Medicare Observation Status Notification Given:  Yes    Malcolm Metro, RN 09/27/2016, 3:28 PM

## 2016-09-27 NOTE — Progress Notes (Signed)
Subjective: Interval History: has no complaint of difficulty in breathing. Patient denies any chest pain. Patient however complains of some leg cramping on dialysis last night  Objective: Vital signs in last 24 hours: Temp:  [98 F (36.7 C)-98.5 F (36.9 C)] 98.5 F (36.9 C) (11/30 0715) Pulse Rate:  [62-110] 64 (11/30 0715) Resp:  [17-30] 27 (11/30 0715) BP: (102-209)/(54-88) 157/62 (11/30 0400) SpO2:  [90 %-100 %] 99 % (11/30 0715) Weight:  [76.5 kg (168 lb 10.4 oz)-78.7 kg (173 lb 8 oz)] 76.5 kg (168 lb 10.4 oz) (11/30 0500) Weight change: 0.228 kg (8 oz)  Intake/Output from previous day: 11/29 0701 - 11/30 0700 In: 531.5 [P.O.:420; I.V.:111.5] Out: 2700 [Urine:400] Intake/Output this shift: No intake/output data recorded.  General appearance: alert, cooperative and no distress Resp: clear to auscultation bilaterally Cardio: regular rate and rhythm Extremities: extremities normal, atraumatic, no cyanosis or edema  Lab Results:  Recent Labs  09/26/16 1057 09/27/16 0419  WBC 11.4* 10.4  HGB 10.2* 10.0*  HCT 32.9* 32.2*  PLT 240 233   BMET:  Recent Labs  09/26/16 0947 09/27/16 0419  NA 139 138  K 4.2 3.8  CL 100* 99*  CO2 28 30  GLUCOSE 99 116*  BUN 33* 16  CREATININE 5.82* 3.64*  CALCIUM 9.6 9.1   No results for input(Ayala): PTH in the last 72 hours. Iron Studies: No results for input(Ayala): IRON, TIBC, TRANSFERRIN, FERRITIN in the last 72 hours.  Studies/Results: Dg Chest Port 1 View  Result Date: 09/26/2016 CLINICAL DATA:  Shortness of breath. EXAM: PORTABLE CHEST 1 VIEW COMPARISON:  Radiographs of August 22, 2016. FINDINGS: Stable cardiomegaly. Atherosclerosis of thoracic aorta is noted. Diffuse bilateral interstitial densities are noted concerning for pulmonary edema. No pneumothorax is noted. Bony thorax is unremarkable. IMPRESSION: Aortic atherosclerosis. Diffuse bilateral interstitial densities are noted most consistent with pulmonary edema.  Electronically Signed   By: Lupita Raider, M.D.   On: 09/26/2016 07:02    I have reviewed the patient'Ayala current medications.  Assessment/Plan: Problem #1 difficulty breathing: Possibly from pulmonary edema. Patient was dialyzed yesterday with 2.7  liters fluid removal and presently she is feeling much better. Problem #2 hypertension: Presently she is off nitro drip. Her blood pressure is slightly high but reasonable. Problem #3 anemia: Her hemoglobin is within TARGET goal. Problem #4 metabolic bone disease: Her calcium and phosphorus is range Problem #5 anxiety disorder Problem #6 peripheral neuropathy Plan: We'll increase amlodipine to 10 mg by mouth once a day 2] we'll continue his labetalol 200 mg by mouth twice a day 3] patient does require dialysis today but will dialyze her tomorrow. 4] we'll check her CBC and renal panel in the morning.   LOS: 1 day   Patricia Ayala 09/27/2016,8:02 AM

## 2016-09-27 NOTE — Care Management Note (Signed)
Case Management Note  Patient Details  Name: Patricia Ayala MRN: [id number] Date of Birth: [birth year]  Subjective/Objective:                  Pt admitted with pulmonary edema. She is from home, lives with her husband and has family at bedside. Pt has PCP, drive herself to appointments. She has no DME or HH services PTA. She uses [facility] for medications. She is on HD T/TH/Sa at [facility] in [place].   Action/Plan: She plans to return home with self care at DC.  Anticipate DC home tomorrow following HD.  No CM needs anticipated.   Expected Discharge Date:     09/28/2016             Expected Discharge Plan:  Home/Self Care  In-House Referral:  NA  Discharge planning Services  CM Consult  Post Acute Care Choice:  NA Choice offered to:  NA  Status of Service:  Completed, signed off   Malcolm Metro, RN 09/27/2016, 3:29 PM

## 2016-09-28 LAB — RENAL FUNCTION PANEL
ANION GAP: 9 (ref 5–15)
Albumin: 3.1 g/dL — ABNORMAL LOW (ref 3.5–5.0)
BUN: 31 mg/dL — ABNORMAL HIGH (ref 6–20)
CO2: 28 mmol/L (ref 22–32)
Calcium: 9.5 mg/dL (ref 8.9–10.3)
Chloride: 100 mmol/L — ABNORMAL LOW (ref 101–111)
Creatinine, Ser: 5.89 mg/dL — ABNORMAL HIGH (ref 0.44–1.00)
GFR calc non Af Amer: 6 mL/min — ABNORMAL LOW (ref >60)
GFR, EST AFRICAN AMERICAN: 7 mL/min — AB (ref >60)
GLUCOSE: 101 mg/dL — AB (ref 65–99)
POTASSIUM: 3.9 mmol/L (ref 3.5–5.1)
Phosphorus: 5.4 mg/dL — ABNORMAL HIGH (ref 2.5–4.6)
Sodium: 137 mmol/L (ref 135–145)

## 2016-09-28 LAB — CBC
HEMATOCRIT: 31.8 % — AB (ref 36.0–46.0)
HEMOGLOBIN: 9.7 g/dL — AB (ref 12.0–15.0)
MCH: 30.1 pg (ref 26.0–34.0)
MCHC: 30.5 g/dL (ref 30.0–36.0)
MCV: 98.8 fL (ref 78.0–100.0)
Platelets: 230 10*3/uL (ref 150–400)
RBC: 3.22 MIL/uL — AB (ref 3.87–5.11)
RDW: 13.7 % (ref 11.5–15.5)
WBC: 8.9 10*3/uL (ref 4.0–10.5)

## 2016-09-28 MED ORDER — SODIUM CHLORIDE 0.9 % IV SOLN: 100.0000 mL | INTRAVENOUS | Status: DC | PRN

## 2016-09-28 MED ORDER — LIDOCAINE HCL (PF) 1 % IJ SOLN: 5.0000 mL | INTRAMUSCULAR | Status: DC | PRN

## 2016-09-28 MED ORDER — ALTEPLASE 2 MG IJ SOLR
2.0000 mg | Freq: Once | INTRAMUSCULAR | Status: DC | PRN
  Filled 2016-09-28: qty 2

## 2016-09-28 NOTE — Progress Notes (Signed)
[facility]  PROGRESS NOTE  Leafy Kindlerene B Rust RUE:[id number] DOB: 06-Apr-1929 DOA: 09/26/2016 PCP: Cassell SmilesFUSCO,LAWRENCE J., MD   Brief HPI:    82 y.o. female, With a history of ESRD on hemodialysis Tuesday Thursday and Saturday came to the ED with worsening shortness of breath since this morning. He shouldn't says that usually sleeps with 2 pillows for past 1 year, this morning she noticed she was more short of breath than usual also having generalized pain involving both sides of chest and upper abdomen. She denies coughing up phlegm but has been coughing. No nausea vomiting or diarrhea. No constipation. No dysuria. Patient still makes urine twice a day. She got dialyzed yesterday.   Subjective   Patient seen and examined, breathing has improved, she is off nitro drip. Echocardiogram showed diastolic dysfunction, with EF 14-78%55-60%.Seen after HD, says that she feels weak, and is having cramps in the legs.   Assessment/Plan:     1. Pulmonary edema- improved after hemodialysis and nitroglycerin. Nitroglycerin has been tapered off.  2. Hypertension- patient is off nitro drip, blood pressure slightly elevated. Continue amlodipine, labetalol,  hydralazine when necessary. 3. Diastolic heart failure- echocardiogram shows EF 55-60% with diastolic dysfunction, slowly improving with hemodialysis. 4. ESRD on hemodialysis- patient is a minimal dialysis Tuesday Thursday and Saturday. Last dialysis was yesterday. Next dialysis session on 09/28/2016. 5. Mild elevation of troponin- troponin is mildly elevated to 0.05, likely from demand ischemia. 6. ESRD - on hemodialysis, feels weal after todays HD.    DVT prophylaxis: Heparin  Code Status: Full code  Family Communication: Discussed with patient's daughter on phone  Disposition Plan: Home when medically stable   Consultants:  Nephrology  Procedures:  None  Continuous infusions KVO    Antibiotics:   Anti-infectives    None        Objective   Vitals:   09/28/16 1500 09/28/16 1530 09/28/16 1550 09/28/16 1615  BP: (!) 141/73 137/70 (!) 154/76 (!) 155/73  Pulse: 64 66 66 65  Resp:    16  Temp:      TempSrc:      SpO2:      Weight:      Height:        Intake/Output Summary (Last 24 hours) at 09/28/16 1636 Last data filed at 09/28/16 1550  Gross per 24 hour  Intake              600 ml  Output             2504 ml  Net            -1904 ml   Filed Weights   09/27/16 0500 09/28/16 0455 09/28/16 1145  Weight: 76.5 kg (168 lb 10.4 oz) 76.9 kg (169 lb 8.5 oz) 77 kg (169 lb 12.1 oz)     Physical Examination:  General exam: Appears calm and comfortable. Respiratory system:  Bibasilar crackles Cardiovascular system:  RRR. No  murmurs, rubs, gallops. 2+ pitting edema GI system: Abdomen is nondistended, soft and nontender. No organomegaly.  Central nervous system. No focal neurological deficits. 5 x 5 power in all extremities. Skin: No rashes, lesions or ulcers. Psychiatry: Alert, oriented x 3.Judgement and insight appear normal. Affect normal.    Data Reviewed: I have personally reviewed following labs and imaging studies  CBG: No results for input(s): GLUCAP in the last 168 hours.  CBC:  Recent Labs Lab 09/26/16 0629 09/26/16 1057 09/27/16 0419 09/28/16 0416  WBC 10.7* 11.4* 10.4 8.9  NEUTROABS 7.9*  --   --   --   HGB 10.7* 10.2* 10.0* 9.7*  HCT 34.7* 32.9* 32.2* 31.8*  MCV 98.0 97.9 97.9 98.8  PLT 270 240 233 230    Basic Metabolic Panel:  Recent Labs Lab 09/26/16 0629 09/26/16 0947 09/27/16 0419 09/28/16 0416  NA 139 139 138 137  K 4.2 4.2 3.8 3.9  CL 100* 100* 99* 100*  CO2 28 28 30 28   GLUCOSE 150* 99 116* 101*  BUN 32* 33* 16 31*  CREATININE 5.69* 5.82* 3.64* 5.89*  CALCIUM 9.9 9.6 9.1 9.5  PHOS  --  4.9*  --  5.4*    Recent Results (from the past 240 hour(s))  MRSA PCR Screening     Status: None   Collection Time: 09/26/16  9:30 AM  Result Value Ref Range Status    MRSA by PCR NEGATIVE NEGATIVE Final    Comment:        The GeneXpert MRSA Assay (FDA approved for NASAL specimens only), is one component of a comprehensive MRSA colonization surveillance program. It is not intended to diagnose MRSA infection nor to guide or monitor treatment for MRSA infections.   C difficile quick scan w PCR reflex     Status: None   Collection Time: 09/26/16 12:50 PM  Result Value Ref Range Status   C Diff antigen NEGATIVE NEGATIVE Final   C Diff toxin NEGATIVE NEGATIVE Final   C Diff interpretation No C. difficile detected.  Final     Liver Function Tests:  Recent Labs Lab 09/26/16 0947 09/27/16 0419 09/28/16 0416  AST  --  17  --   ALT  --  16  --   ALKPHOS  --  42  --   BILITOT  --  0.5  --   PROT  --  6.4*  --   ALBUMIN 3.5 3.3* 3.1*   No results for input(s): LIPASE, AMYLASE in the last 168 hours. No results for input(s): AMMONIA in the last 168 hours.  Cardiac Enzymes:  Recent Labs Lab 09/26/16 0629 09/26/16 0947 09/26/16 1530 09/26/16 2119  TROPONINI 0.04* 0.05* 0.05* 0.05*   BNP (last 3 results)  Recent Labs  09/26/16 0629  BNP 1,570.0*    ProBNP (last 3 results) No results for input(s): PROBNP in the last 8760 hours.    Studies: No results found.  Scheduled Meds: . amLODipine  10 mg Oral Daily  . feeding supplement (NEPRO CARB STEADY)  237 mL Oral Q24H  . heparin  5,000 Units Subcutaneous Q8H  . labetalol  200 mg Oral BID  . simvastatin  10 mg Oral QHS  . sodium chloride flush  3 mL Intravenous Q12H      Time spent: 25 min  [facility]   [facility] Pager [phone]. If 7PM-7AM, please contact night-coverage at [email or web address], Office  [phone]  password [email or web address] 09/28/2016, 4:36 PM  LOS: 1 day

## 2016-09-28 NOTE — Procedures (Signed)
   HEMODIALYSIS TREATMENT NOTE:  4 hour heparin-free dialysis completed via left upper arm AVF (15g/antegrade). Goal NOT met: Unable to meet goal of 2.5 liters due to declining BP.  Net UF 2.1 liters.  All blood was returned and hemostasis was achieved within 15 minutes. Report called to [facility], RN.  Rockwell Alexandria, RN, CDN

## 2016-09-28 NOTE — Progress Notes (Addendum)
Subjective: Interval History: Patient performs no complaints. She denies any chest pain or difficulty breathing.  Objective: Vital signs in last 24 hours: Temp:  [97.4 F (36.3 C)-98.9 F (37.2 C)] 98.3 F (36.8 C) (12/01 0700) Pulse Rate:  [59-73] 66 (12/01 0700) Resp:  [16-24] 20 (12/01 0700) BP: (122-178)/(54-79) 164/67 (12/01 0700) SpO2:  [98 %-100 %] 100 % (12/01 0700) Weight:  [76.9 kg (169 lb 8.5 oz)] 76.9 kg (169 lb 8.5 oz) (12/01 0455) Weight change: -1.8 kg (-3 lb 15.5 oz)  Intake/Output from previous day: 11/30 0701 - 12/01 0700 In: 510 [P.O.:480; I.V.:30] Out: 250 [Urine:250] Intake/Output this shift: No intake/output data recorded.  General appearance: alert, cooperative and no distress Resp: clear to auscultation bilaterally Cardio: regular rate and rhythm Extremities: extremities normal, atraumatic, no cyanosis or edema  Lab Results:  Recent Labs  09/27/16 0419 09/28/16 0416  WBC 10.4 8.9  HGB 10.0* 9.7*  HCT 32.2* 31.8*  PLT 233 230   BMET:   Recent Labs  09/27/16 0419 09/28/16 0416  NA 138 137  K 3.8 3.9  CL 99* 100*  CO2 30 28  GLUCOSE 116* 101*  BUN 16 31*  CREATININE 3.64* 5.89*  CALCIUM 9.1 9.5   No results for input(s): PTH in the last 72 hours. Iron Studies: No results for input(s): IRON, TIBC, TRANSFERRIN, FERRITIN in the last 72 hours.  Studies/Results: No results found.  I have reviewed the patient's current medications.  Assessment/Plan: Problem #1 difficulty breathing: Possibly from pulmonary edema. Patient presently is asymptomatic. Problem #2 hypertension: Patient is on labetalol and amlodipine. Amlodpine  was increased to 10 mg po once a day . Her blood pressure is somewhat better . Problem #3 anemia: Her hemoglobin is below TARGET goal. patient is on Epogen during dialysis. Problem #4 metabolic bone disease: Her calcium and phosphorus is range Problem #5 anxiety disorder Problem #6 peripheral neuropathy Plan: 1]  we'll make arrangements for patient to dialysis today. 2] we'll follow her blood pressure other outpatient and make an adjustment.    LOS: 1 day   Raymund Manrique S 09/28/2016,8:40 AM

## 2016-09-29 MED ORDER — AMLODIPINE BESYLATE 10 MG PO TABS: 10.0000 mg | ORAL_TABLET | Freq: Every day | ORAL | 2 refills | Status: DC

## 2016-09-29 NOTE — Discharge Summary (Signed)
Physician Discharge Summary  Patricia Ayala Valent ZOX:[id number] DOB: 1929-10-08 DOA: 09/26/2016  PCP: Cassell SmilesFUSCO,LAWRENCE J., MD  Admit date: 09/26/2016 Discharge date: 09/29/2016  Time spent: 25 minutes  Recommendations for Outpatient Follow-up:  Follow up PCP in 2 weeks   Discharge Diagnoses:  Active Problems:   End stage renal disease [facility](HCC)   Pulmonary edema   Discharge Condition: Stable  Diet recommendation: Low salt diet  Filed Weights   09/28/16 0455 09/28/16 1145 09/29/16 0400  Weight: 76.9 kg (169 lb 8.5 oz) 77 kg (169 lb 12.1 oz) 74.3 kg (163 lb 12.8 oz)    History of present illness:  82 y.o.female,With a history of ESRD on hemodialysis Tuesday Thursday and Saturday came to the ED with worsening shortness of breath since this morning. He shouldn't says that usually sleeps with 2 pillows for past 1 year,this morning she noticed she was more short of breath than usual also having generalized pain involving both sides of chest and upper abdomen. She denies coughing up phlegm but has been coughing. No nausea vomiting or diarrhea. No constipation. No dysuria. Patient still makes urine twice a day. She got dialyzed yesterday.  Hospital Course:  1. Pulmonary edema- improved after hemodialysis and nitroglycerin. Nitroglycerin has been tapered off.  2. Hypertension- patient is off nitro drip, blood pressure has improved. Continue Amlodipine 10 mg po daily, labetalol 200 mg po bid. 3. Diastolic heart failure- echocardiogram shows EF 55-60% with diastolic dysfunction, improved with  hemodialysis. 4. ESRD on hemodialysis- patient is a minimal dialysis Tuesday Thursday and Saturday. Last dialysis was yesterday. Next dialysis session on 10/01/2016. 5. Mild elevation of troponin- troponin is mildly elevated to 0.05, likely from demand ischemia.    Procedures        None   Consultations:  Nephrology   Discharge Exam: Vitals:   09/29/16 0700 09/29/16 0900  BP: (!) 153/53 (!)  144/62  Pulse: 60 63  Resp: 14 16  Temp:      General: Appears in no acute distress Cardiovascular: RRR Respiratory: Clear bilaterally  Discharge Instructions   Discharge Instructions    Diet - low sodium heart healthy    Complete by:  As directed    Increase activity slowly    Complete by:  As directed      Current Discharge Medication List    CONTINUE these medications which have CHANGED   Details  amLODipine (NORVASC) 10 MG tablet Take 1 tablet (10 mg total) by mouth daily. Qty: 30 tablet, Refills: 2      CONTINUE these medications which have NOT CHANGED   Details  ALPRAZolam (XANAX) 0.5 MG tablet Take 1 tablet by mouth 3 (three) times daily as needed for anxiety.    BIOTIN PO Take 1,000 mg by mouth daily.     labetalol (NORMODYNE) 200 MG tablet Take 200 mg by mouth 2 (two) times daily.     lidocaine-prilocaine (EMLA) cream Apply 1 application topically as needed (for IV access).     multivitamin (RENA-VIT) TABS tablet Take 1 tablet by mouth daily.    pantoprazole (PROTONIX) 40 MG tablet Take 1 tablet by mouth daily.    promethazine (PHENERGAN) 25 MG tablet Take 25 mg by mouth every 6 (six) hours as needed for nausea or vomiting.    simvastatin (ZOCOR) 10 MG tablet Take 10 mg by mouth at bedtime.     zolpidem (AMBIEN) 10 MG tablet Take 10 mg by mouth at bedtime as needed for sleep.  STOP taking these medications     acetaminophen (TYLENOL) 500 MG tablet        No Known Allergies Follow-up Information    FUSCO,LAWRENCE J., MD. Schedule an appointment as soon as possible for a visit in 2 week(s).   Specialty:  Internal Medicine Contact information: [address] [place] [address] [phone]            The results of significant diagnostics from this hospitalization (including imaging, microbiology, ancillary and laboratory) are listed below for reference.    Significant Diagnostic Studies: Dg Chest Port 1 View  Result Date:  09/26/2016 CLINICAL DATA:  Shortness of breath. EXAM: PORTABLE CHEST 1 VIEW COMPARISON:  Radiographs of August 22, 2016. FINDINGS: Stable cardiomegaly. Atherosclerosis of thoracic aorta is noted. Diffuse bilateral interstitial densities are noted concerning for pulmonary edema. No pneumothorax is noted. Bony thorax is unremarkable. IMPRESSION: Aortic atherosclerosis. Diffuse bilateral interstitial densities are noted most consistent with pulmonary edema. Electronically Signed   By: Lupita Raider, M.D.   On: 09/26/2016 07:02    Microbiology: Recent Results (from the past 240 hour(s))  MRSA PCR Screening     Status: None   Collection Time: 09/26/16  9:30 AM  Result Value Ref Range Status   MRSA by PCR NEGATIVE NEGATIVE Final    Comment:        The GeneXpert MRSA Assay (FDA approved for NASAL specimens only), is one component of a comprehensive MRSA colonization surveillance program. It is not intended to diagnose MRSA infection nor to guide or monitor treatment for MRSA infections.   C difficile quick scan w PCR reflex     Status: None   Collection Time: 09/26/16 12:50 PM  Result Value Ref Range Status   C Diff antigen NEGATIVE NEGATIVE Final   C Diff toxin NEGATIVE NEGATIVE Final   C Diff interpretation No C. difficile detected.  Final     Labs: Basic Metabolic Panel:  Recent Labs Lab 09/26/16 0629 09/26/16 0947 09/27/16 0419 09/28/16 0416  NA 139 139 138 137  K 4.2 4.2 3.8 3.9  CL 100* 100* 99* 100*  CO2 28 28 30 28   GLUCOSE 150* 99 116* 101*  BUN 32* 33* 16 31*  CREATININE 5.69* 5.82* 3.64* 5.89*  CALCIUM 9.9 9.6 9.1 9.5  PHOS  --  4.9*  --  5.4*   Liver Function Tests:  Recent Labs Lab 09/26/16 0947 09/27/16 0419 09/28/16 0416  AST  --  17  --   ALT  --  16  --   ALKPHOS  --  42  --   BILITOT  --  0.5  --   PROT  --  6.4*  --   ALBUMIN 3.5 3.3* 3.1*   No results for input(s): LIPASE, AMYLASE in the last 168 hours. No results for input(s): AMMONIA in  the last 168 hours. CBC:  Recent Labs Lab 09/26/16 0629 09/26/16 1057 09/27/16 0419 09/28/16 0416  WBC 10.7* 11.4* 10.4 8.9  NEUTROABS 7.9*  --   --   --   HGB 10.7* 10.2* 10.0* 9.7*  HCT 34.7* 32.9* 32.2* 31.8*  MCV 98.0 97.9 97.9 98.8  PLT 270 240 233 230   Cardiac Enzymes:  Recent Labs Lab 09/26/16 0629 09/26/16 0947 09/26/16 1530 09/26/16 2119  TROPONINI 0.04* 0.05* 0.05* 0.05*   BNP: BNP (last 3 results)  Recent Labs  09/26/16 0629  BNP 1,570.0*     Signed:  Meredeth Ide MD.  [facility] 09/29/2016, 9:56 AM

## 2016-09-29 NOTE — Progress Notes (Signed)
Subjective: Interval History: Patient feels good and no complaint  Objective: Vital signs in last 24 hours: Temp:  [98.3 F (36.8 C)-99.3 F (37.4 C)] 98.6 F (37 C) (12/02 0400) Pulse Rate:  [57-78] 60 (12/02 0700) Resp:  [14-21] 14 (12/02 0700) BP: (120-188)/(51-85) 153/53 (12/02 0700) SpO2:  [92 %-100 %] 96 % (12/02 0843) FiO2 (%):  [28 %] 28 % (12/01 2000) Weight:  [74.3 kg (163 lb 12.8 oz)-77 kg (169 lb 12.1 oz)] 74.3 kg (163 lb 12.8 oz) (12/02 0400) Weight change: 0.1 kg (3.5 oz)  Intake/Output from previous day: 12/01 0701 - 12/02 0700 In: 610 [P.O.:240; I.V.:370] Out: 2254 [Urine:125] Intake/Output this shift: No intake/output data recorded.  General appearance: alert, cooperative and no distress Resp: clear to auscultation bilaterally Cardio: regular rate and rhythm Extremities: extremities normal, atraumatic, no cyanosis or edema  Lab Results:  Recent Labs  09/27/16 0419 09/28/16 0416  WBC 10.4 8.9  HGB 10.0* 9.7*  HCT 32.2* 31.8*  PLT 233 230   BMET:   Recent Labs  09/27/16 0419 09/28/16 0416  NA 138 137  K 3.8 3.9  CL 99* 100*  CO2 30 28  GLUCOSE 116* 101*  BUN 16 31*  CREATININE 3.64* 5.89*  CALCIUM 9.1 9.5   No results for input(s): PTH in the last 72 hours. Iron Studies: No results for input(s): IRON, TIBC, TRANSFERRIN, FERRITIN in the last 72 hours.  Studies/Results: No results found.  I have reviewed the patient's current medications.  Assessment/Plan: Problem #1 difficulty breathing: Possibly from pulmonary edema. Patient was dialyzed yesterday and able to remove about 2200 cc presently asymptotmaitc Problem #2 hypertension: Patient is on labetalol and amlodipine. Her blood pressure is better Problem #3 anemia: Her hemoglobin is below TARGET goal. patient is on Epogen during dialysis. Problem #4 metabolic bone disease: Her calcium and phosphorus is range Problem #5 anxiety disorder Problem #6 peripheral neuropathy Problem #7  ESRD: Her potassium is normal and no uremic sign and symptoms Plan: 1] Her next dialysis was on Monday and I have called her unit and inform them 2] we'll follow her blood pressure other outpatient and make an adjustment.    LOS: 1 day   Patricia Ayala S 09/29/2016,9:23 AM

## 2016-10-01 DIAGNOSIS — N186 End stage renal disease: Secondary | ICD-10-CM | POA: Diagnosis not present

## 2016-10-01 DIAGNOSIS — Z992 Dependence on renal dialysis: Secondary | ICD-10-CM | POA: Diagnosis not present

## 2016-10-04 DIAGNOSIS — Z992 Dependence on renal dialysis: Secondary | ICD-10-CM | POA: Diagnosis not present

## 2016-10-04 DIAGNOSIS — N186 End stage renal disease: Secondary | ICD-10-CM | POA: Diagnosis not present

## 2016-10-06 DIAGNOSIS — N186 End stage renal disease: Secondary | ICD-10-CM | POA: Diagnosis not present

## 2016-10-06 DIAGNOSIS — Z992 Dependence on renal dialysis: Secondary | ICD-10-CM | POA: Diagnosis not present

## 2016-10-08 DIAGNOSIS — Z1389 Encounter for screening for other disorder: Secondary | ICD-10-CM | POA: Diagnosis not present

## 2016-10-08 DIAGNOSIS — J9600 Acute respiratory failure, unspecified whether with hypoxia or hypercapnia: Secondary | ICD-10-CM | POA: Diagnosis not present

## 2016-10-08 DIAGNOSIS — I10 Essential (primary) hypertension: Secondary | ICD-10-CM | POA: Diagnosis not present

## 2016-10-08 DIAGNOSIS — R600 Localized edema: Secondary | ICD-10-CM | POA: Diagnosis not present

## 2016-10-08 DIAGNOSIS — M79606 Pain in leg, unspecified: Secondary | ICD-10-CM | POA: Diagnosis not present

## 2016-10-08 DIAGNOSIS — J810 Acute pulmonary edema: Secondary | ICD-10-CM | POA: Diagnosis not present

## 2016-10-09 ENCOUNTER — Other Ambulatory Visit (HOSPITAL_COMMUNITY): Payer: Self-pay | Admitting: Internal Medicine

## 2016-10-09 DIAGNOSIS — I739 Peripheral vascular disease, unspecified: Secondary | ICD-10-CM

## 2016-10-09 DIAGNOSIS — N186 End stage renal disease: Secondary | ICD-10-CM | POA: Diagnosis not present

## 2016-10-09 DIAGNOSIS — Z992 Dependence on renal dialysis: Secondary | ICD-10-CM | POA: Diagnosis not present

## 2016-10-11 DIAGNOSIS — N186 End stage renal disease: Secondary | ICD-10-CM | POA: Diagnosis not present

## 2016-10-11 DIAGNOSIS — Z992 Dependence on renal dialysis: Secondary | ICD-10-CM | POA: Diagnosis not present

## 2016-10-13 DIAGNOSIS — N186 End stage renal disease: Secondary | ICD-10-CM | POA: Diagnosis not present

## 2016-10-13 DIAGNOSIS — Z992 Dependence on renal dialysis: Secondary | ICD-10-CM | POA: Diagnosis not present

## 2016-10-16 DIAGNOSIS — N186 End stage renal disease: Secondary | ICD-10-CM | POA: Diagnosis not present

## 2016-10-16 DIAGNOSIS — Z992 Dependence on renal dialysis: Secondary | ICD-10-CM | POA: Diagnosis not present

## 2016-10-17 ENCOUNTER — Ambulatory Visit (HOSPITAL_COMMUNITY)
Admission: RE | Admit: 2016-10-17 | Discharge: 2016-10-17 | Disposition: A | Discharge status: Home / Self Care | Payer: Medicare Other | Source: Ambulatory Visit | Attending: Internal Medicine | Admitting: Internal Medicine

## 2016-10-17 DIAGNOSIS — I70202 Unspecified atherosclerosis of native arteries of extremities, left leg: Secondary | ICD-10-CM | POA: Insufficient documentation

## 2016-10-17 DIAGNOSIS — I739 Peripheral vascular disease, unspecified: Secondary | ICD-10-CM | POA: Insufficient documentation

## 2016-10-17 DIAGNOSIS — M79662 Pain in left lower leg: Secondary | ICD-10-CM | POA: Diagnosis not present

## 2016-10-17 DIAGNOSIS — M79661 Pain in right lower leg: Secondary | ICD-10-CM | POA: Diagnosis not present

## 2016-10-18 DIAGNOSIS — Z992 Dependence on renal dialysis: Secondary | ICD-10-CM | POA: Diagnosis not present

## 2016-10-18 DIAGNOSIS — N186 End stage renal disease: Secondary | ICD-10-CM | POA: Diagnosis not present

## 2016-10-20 DIAGNOSIS — Z992 Dependence on renal dialysis: Secondary | ICD-10-CM | POA: Diagnosis not present

## 2016-10-20 DIAGNOSIS — N186 End stage renal disease: Secondary | ICD-10-CM | POA: Diagnosis not present

## 2016-10-23 DIAGNOSIS — N186 End stage renal disease: Secondary | ICD-10-CM | POA: Diagnosis not present

## 2016-10-23 DIAGNOSIS — Z992 Dependence on renal dialysis: Secondary | ICD-10-CM | POA: Diagnosis not present

## 2016-10-25 DIAGNOSIS — N186 End stage renal disease: Secondary | ICD-10-CM | POA: Diagnosis not present

## 2016-10-25 DIAGNOSIS — Z992 Dependence on renal dialysis: Secondary | ICD-10-CM | POA: Diagnosis not present

## 2016-10-28 DIAGNOSIS — Z992 Dependence on renal dialysis: Secondary | ICD-10-CM | POA: Diagnosis not present

## 2016-10-28 DIAGNOSIS — N186 End stage renal disease: Secondary | ICD-10-CM | POA: Diagnosis not present

## 2016-10-29 ENCOUNTER — Emergency Department (HOSPITAL_COMMUNITY): Payer: Medicare Other

## 2016-10-29 ENCOUNTER — Encounter (HOSPITAL_COMMUNITY): Payer: Self-pay

## 2016-10-29 ENCOUNTER — Inpatient Hospital Stay (HOSPITAL_COMMUNITY)
Admission: EM | Admit: 2016-10-29 | Discharge: 2016-10-30 | DRG: 291 | Disposition: A | Discharge status: Home / Self Care | Payer: Medicare Other | Attending: Internal Medicine | Admitting: Internal Medicine

## 2016-10-29 DIAGNOSIS — K219 Gastro-esophageal reflux disease without esophagitis: Secondary | ICD-10-CM | POA: Diagnosis present

## 2016-10-29 DIAGNOSIS — I5033 Acute on chronic diastolic (congestive) heart failure: Secondary | ICD-10-CM | POA: Diagnosis not present

## 2016-10-29 DIAGNOSIS — D631 Anemia in chronic kidney disease: Secondary | ICD-10-CM | POA: Diagnosis not present

## 2016-10-29 DIAGNOSIS — Z9115 Patient's noncompliance with renal dialysis: Secondary | ICD-10-CM

## 2016-10-29 DIAGNOSIS — Z90710 Acquired absence of both cervix and uterus: Secondary | ICD-10-CM | POA: Diagnosis not present

## 2016-10-29 DIAGNOSIS — N186 End stage renal disease: Secondary | ICD-10-CM

## 2016-10-29 DIAGNOSIS — Z79899 Other long term (current) drug therapy: Secondary | ICD-10-CM

## 2016-10-29 DIAGNOSIS — J9600 Acute respiratory failure, unspecified whether with hypoxia or hypercapnia: Secondary | ICD-10-CM | POA: Diagnosis not present

## 2016-10-29 DIAGNOSIS — E785 Hyperlipidemia, unspecified: Secondary | ICD-10-CM | POA: Diagnosis not present

## 2016-10-29 DIAGNOSIS — I509 Heart failure, unspecified: Secondary | ICD-10-CM | POA: Diagnosis not present

## 2016-10-29 DIAGNOSIS — I10 Essential (primary) hypertension: Secondary | ICD-10-CM | POA: Diagnosis not present

## 2016-10-29 DIAGNOSIS — J069 Acute upper respiratory infection, unspecified: Secondary | ICD-10-CM | POA: Diagnosis not present

## 2016-10-29 DIAGNOSIS — Z8249 Family history of ischemic heart disease and other diseases of the circulatory system: Secondary | ICD-10-CM

## 2016-10-29 DIAGNOSIS — Z992 Dependence on renal dialysis: Secondary | ICD-10-CM | POA: Diagnosis not present

## 2016-10-29 DIAGNOSIS — J811 Chronic pulmonary edema: Secondary | ICD-10-CM | POA: Diagnosis present

## 2016-10-29 DIAGNOSIS — N2581 Secondary hyperparathyroidism of renal origin: Secondary | ICD-10-CM | POA: Diagnosis not present

## 2016-10-29 DIAGNOSIS — I132 Hypertensive heart and chronic kidney disease with heart failure and with stage 5 chronic kidney disease, or end stage renal disease: Secondary | ICD-10-CM | POA: Diagnosis not present

## 2016-10-29 DIAGNOSIS — I120 Hypertensive chronic kidney disease with stage 5 chronic kidney disease or end stage renal disease: Secondary | ICD-10-CM | POA: Diagnosis not present

## 2016-10-29 DIAGNOSIS — R05 Cough: Secondary | ICD-10-CM | POA: Diagnosis not present

## 2016-10-29 DIAGNOSIS — R0603 Acute respiratory distress: Secondary | ICD-10-CM

## 2016-10-29 DIAGNOSIS — F419 Anxiety disorder, unspecified: Secondary | ICD-10-CM | POA: Diagnosis present

## 2016-10-29 LAB — CBC WITH DIFFERENTIAL/PLATELET
Basophils Absolute: 0 10*3/uL (ref 0.0–0.1)
Basophils Relative: 0 %
Eosinophils Absolute: 0.3 10*3/uL (ref 0.0–0.7)
Eosinophils Relative: 2 %
HCT: 33.8 % — ABNORMAL LOW (ref 36.0–46.0)
Hemoglobin: 10.6 g/dL — ABNORMAL LOW (ref 12.0–15.0)
Lymphocytes Relative: 9 %
Lymphs Abs: 1.2 10*3/uL (ref 0.7–4.0)
MCH: 30.5 pg (ref 26.0–34.0)
MCHC: 31.4 g/dL (ref 30.0–36.0)
MCV: 97.4 fL (ref 78.0–100.0)
Monocytes Absolute: 1.2 10*3/uL — ABNORMAL HIGH (ref 0.1–1.0)
Monocytes Relative: 9 %
Neutro Abs: 10.1 10*3/uL — ABNORMAL HIGH (ref 1.7–7.7)
Neutrophils Relative %: 80 %
Platelets: 247 10*3/uL (ref 150–400)
RBC: 3.47 MIL/uL — ABNORMAL LOW (ref 3.87–5.11)
RDW: 14.4 % (ref 11.5–15.5)
WBC: 12.8 10*3/uL — ABNORMAL HIGH (ref 4.0–10.5)

## 2016-10-29 LAB — BRAIN NATRIURETIC PEPTIDE: B Natriuretic Peptide: 1261 pg/mL — ABNORMAL HIGH (ref 0.0–100.0)

## 2016-10-29 LAB — RENAL FUNCTION PANEL
Albumin: 3.7 g/dL (ref 3.5–5.0)
Anion gap: 9 (ref 5–15)
BUN: 57 mg/dL — ABNORMAL HIGH (ref 6–20)
CO2: 27 mmol/L (ref 22–32)
Calcium: 9.3 mg/dL (ref 8.9–10.3)
Chloride: 105 mmol/L (ref 101–111)
Creatinine, Ser: 9.92 mg/dL — ABNORMAL HIGH (ref 0.44–1.00)
GFR calc Af Amer: 4 mL/min — ABNORMAL LOW (ref >60)
GFR calc non Af Amer: 3 mL/min — ABNORMAL LOW (ref >60)
Glucose, Bld: 126 mg/dL — ABNORMAL HIGH (ref 65–99)
Phosphorus: 5.6 mg/dL — ABNORMAL HIGH (ref 2.5–4.6)
Potassium: 4.4 mmol/L (ref 3.5–5.1)
Sodium: 141 mmol/L (ref 135–145)

## 2016-10-29 LAB — INFLUENZA PANEL BY PCR (TYPE A & B)
INFLAPCR: NEGATIVE
INFLBPCR: NEGATIVE

## 2016-10-29 LAB — CBC
HCT: 32.9 % — ABNORMAL LOW (ref 36.0–46.0)
Hemoglobin: 10.3 g/dL — ABNORMAL LOW (ref 12.0–15.0)
MCH: 30.5 pg (ref 26.0–34.0)
MCHC: 31.3 g/dL (ref 30.0–36.0)
MCV: 97.3 fL (ref 78.0–100.0)
PLATELETS: 221 10*3/uL (ref 150–400)
RBC: 3.38 MIL/uL — AB (ref 3.87–5.11)
RDW: 14.2 % (ref 11.5–15.5)
WBC: 12.7 10*3/uL — ABNORMAL HIGH (ref 4.0–10.5)

## 2016-10-29 LAB — MRSA PCR SCREENING: MRSA BY PCR: NEGATIVE

## 2016-10-29 LAB — CREATININE, SERUM
CREATININE: 5.23 mg/dL — AB (ref 0.44–1.00)
GFR calc Af Amer: 8 mL/min — ABNORMAL LOW (ref >60)
GFR calc non Af Amer: 7 mL/min — ABNORMAL LOW (ref >60)

## 2016-10-29 LAB — TROPONIN I: Troponin I: 0.05 ng/mL (ref <0.03)

## 2016-10-29 MED ORDER — LIDOCAINE HCL (PF) 1 % IJ SOLN: 5.0000 mL | INTRAMUSCULAR | Status: DC | PRN

## 2016-10-29 MED ORDER — CINACALCET HCL 30 MG PO TABS
30.0000 mg | ORAL_TABLET | Freq: Every day | ORAL | Status: DC
  Administered 2016-10-29: 30 mg via ORAL
  Filled 2016-10-29 (×2): qty 1

## 2016-10-29 MED ORDER — HEPARIN SODIUM (PORCINE) 1000 UNIT/ML DIALYSIS: 1000.0000 [IU] | INTRAMUSCULAR | Status: DC | PRN

## 2016-10-29 MED ORDER — ACETAMINOPHEN 650 MG RE SUPP: 650.0000 mg | Freq: Four times a day (QID) | RECTAL | Status: DC | PRN

## 2016-10-29 MED ORDER — LABETALOL HCL 200 MG PO TABS
200.0000 mg | ORAL_TABLET | Freq: Two times a day (BID) | ORAL | Status: DC
  Administered 2016-10-29 – 2016-10-30 (×2): 200 mg via ORAL
  Filled 2016-10-29 (×2): qty 1

## 2016-10-29 MED ORDER — EPOETIN ALFA 3000 UNIT/ML IJ SOLN
3000.0000 [IU] | INTRAMUSCULAR | Status: DC
  Administered 2016-10-29: 3000 [IU] via SUBCUTANEOUS
  Filled 2016-10-29 (×2): qty 1

## 2016-10-29 MED ORDER — SEVELAMER CARBONATE 800 MG PO TABS
2400.0000 mg | ORAL_TABLET | Freq: Three times a day (TID) | ORAL | Status: DC
  Administered 2016-10-30: 2400 mg via ORAL
  Filled 2016-10-29: qty 3

## 2016-10-29 MED ORDER — PANTOPRAZOLE SODIUM 40 MG PO TBEC
40.0000 mg | DELAYED_RELEASE_TABLET | Freq: Every day | ORAL | Status: DC
  Administered 2016-10-29 – 2016-10-30 (×2): 40 mg via ORAL
  Filled 2016-10-29 (×2): qty 1

## 2016-10-29 MED ORDER — ONDANSETRON HCL 4 MG/2ML IJ SOLN: 4.0000 mg | Freq: Four times a day (QID) | INTRAMUSCULAR | Status: DC | PRN

## 2016-10-29 MED ORDER — LIDOCAINE-PRILOCAINE 2.5-2.5 % EX CREA: 1.0000 "application " | TOPICAL_CREAM | CUTANEOUS | Status: DC | PRN

## 2016-10-29 MED ORDER — PENTAFLUOROPROP-TETRAFLUOROETH EX AERO: 1.0000 "application " | INHALATION_SPRAY | CUTANEOUS | Status: DC | PRN

## 2016-10-29 MED ORDER — AMLODIPINE BESYLATE 5 MG PO TABS
10.0000 mg | ORAL_TABLET | Freq: Every day | ORAL | Status: DC
  Administered 2016-10-29 – 2016-10-30 (×2): 10 mg via ORAL
  Filled 2016-10-29 (×2): qty 2

## 2016-10-29 MED ORDER — ORAL CARE MOUTH RINSE: 15.0000 mL | Freq: Two times a day (BID) | OROMUCOSAL | Status: DC

## 2016-10-29 MED ORDER — ONDANSETRON HCL 4 MG PO TABS: 4.0000 mg | ORAL_TABLET | Freq: Four times a day (QID) | ORAL | Status: DC | PRN

## 2016-10-29 MED ORDER — SIMVASTATIN 10 MG PO TABS
10.0000 mg | ORAL_TABLET | Freq: Every day | ORAL | Status: DC
  Administered 2016-10-29: 10 mg via ORAL
  Filled 2016-10-29: qty 1

## 2016-10-29 MED ORDER — PENTOXIFYLLINE ER 400 MG PO TBCR
400.0000 mg | EXTENDED_RELEASE_TABLET | Freq: Every day | ORAL | Status: DC
  Administered 2016-10-29 – 2016-10-30 (×2): 400 mg via ORAL
  Filled 2016-10-29 (×2): qty 1

## 2016-10-29 MED ORDER — HEPARIN SODIUM (PORCINE) 1000 UNIT/ML DIALYSIS: 20.0000 [IU]/kg | INTRAMUSCULAR | Status: DC | PRN

## 2016-10-29 MED ORDER — ALPRAZOLAM 0.5 MG PO TABS
0.5000 mg | ORAL_TABLET | Freq: Three times a day (TID) | ORAL | Status: DC | PRN
Start: 2016-10-29 — End: 2016-10-30
  Administered 2016-10-29: 0.5 mg via ORAL
  Filled 2016-10-29: qty 1

## 2016-10-29 MED ORDER — HEPARIN SODIUM (PORCINE) 5000 UNIT/ML IJ SOLN
5000.0000 [IU] | Freq: Three times a day (TID) | INTRAMUSCULAR | Status: DC
  Administered 2016-10-29 – 2016-10-30 (×2): 5000 [IU] via SUBCUTANEOUS
  Filled 2016-10-29 (×2): qty 1

## 2016-10-29 MED ORDER — CHLORHEXIDINE GLUCONATE 0.12 % MT SOLN
15.0000 mL | Freq: Two times a day (BID) | OROMUCOSAL | Status: DC
  Administered 2016-10-29 – 2016-10-30 (×2): 15 mL via OROMUCOSAL
  Filled 2016-10-29 (×2): qty 15

## 2016-10-29 MED ORDER — EPOETIN ALFA 4000 UNIT/ML IJ SOLN
INTRAMUSCULAR | Status: AC
  Filled 2016-10-29: qty 1

## 2016-10-29 MED ORDER — RENA-VITE PO TABS
1.0000 | ORAL_TABLET | Freq: Every day | ORAL | Status: DC
  Administered 2016-10-29 – 2016-10-30 (×2): 1 via ORAL
  Filled 2016-10-29 (×2): qty 1

## 2016-10-29 MED ORDER — ALTEPLASE 2 MG IJ SOLR: 2.0000 mg | Freq: Once | INTRAMUSCULAR | Status: DC | PRN

## 2016-10-29 MED ORDER — SODIUM CHLORIDE 0.9 % IV SOLN: 100.0000 mL | INTRAVENOUS | Status: DC | PRN

## 2016-10-29 MED ORDER — ACETAMINOPHEN 325 MG PO TABS: 650.0000 mg | ORAL_TABLET | Freq: Four times a day (QID) | ORAL | Status: DC | PRN

## 2016-10-29 NOTE — Consult Note (Addendum)
Patricia Ayala MRN: [id number] DOB/AGE: April 30, 1929 83 y.o. Primary Care Physician:FUSCO,LAWRENCE J., MD Admit date: 10/29/2016 Chief Complaint:  Chief Complaint  Patient presents with  . Shortness of Breath   HPI: Pt is a 83 year old  female with a past medical history of ESRD who came to the ER with  C/o  shortness of breath .  HPI dates back to  this morning when pt had sudden onset of dyspnea. Py also  says that she usually sleeps with 2 pillows but this morning she noticed she was more short of breath than usual . Pt c/o feeling congested. "I have running nose."  No nausea vomiting or diarrhea.  No constipation. No dysuria. Up  On evaluation in ER chest x-ray showed pulmonary edema . Pt was admitted for further care and Nephrology was consulted. Pt daughters are also present in the room. Pt main concern continues to be shortness of breath. Pt says -"I need my dialysis"   Past Medical History:  Diagnosis Date  . Anemia 02/11/2012  . Anxiety   . Chronic kidney disease    not on dialysis yet, Tues, thurs, sat  . H/O metabolic acidosis   . Hyperparathyroidism, secondary (HCC)   . Hypertension    Dr. Regino Schultze, [place], [place]  . Vitamin D deficiency         Family History  Problem Relation Age of Onset  . Stroke Mother   . Cancer Father   . Heart attack Father   NO hx of ESRD  Social History:  reports that she has never smoked. She has never used smokeless tobacco. She reports that she does not drink alcohol or use drugs.   Allergies: No Known Allergies    WUJ:WJXBJ from the symptoms mentioned above,there are no other symptoms referable to all systems reviewed.  Physical Exam: Vital signs in last 24 hours: Temp:  [98.3 F (36.8 C)] 98.3 F (36.8 C) (01/01 0737) Pulse Rate:  [68-80] 73 (01/01 0800) Resp:  [22-28] 28 (01/01 0800) BP: (183-189)/(72-78) 183/78 (01/01 0800) SpO2:  [89 %-95 %] 93 % (01/01 0800) Weight:  [163 lb (73.9 kg)] 163 lb (73.9 kg) (01/01  0730) Weight change:     Intake/Output from previous day: No intake/output data recorded. No intake/output data recorded.   Physical Exam: General- pt is awake,alert, oriented to time place and person Resp- No acute REsp distress, Rhonchi+ CVS- S1S2 regular in rate and rhythm GIT- BS+, soft, NT, ND EXT- trace LE Edema, NO Cyanosis CNS- CN 2-12 grossly intact. Moving all 4 extremities Psych- normal mood and affect Access- AVF +   Lab Results: CBC  Recent Labs  10/29/16 0740  WBC 12.8*  HGB 10.6*  HCT 33.8*  PLT 247    BMET  Recent Labs  10/29/16 0740  NA 141  K 4.4  CL 105  CO2 27  GLUCOSE 126*  BUN 57*  CREATININE 9.92*  CALCIUM 9.3    MICRO No results found for this or any previous visit (from the past 240 hour(s)).    Lab Results  Component Value Date   CALCIUM 9.3 10/29/2016   CAION 1.07 (L) 11/05/2014   PHOS 5.6 (H) 10/29/2016   CXR showed-Congestive heart failure similar in appearance to the most recent prior chest radiograph.  Impression: 1)Renal  ESRD on HD                Pt is on Tue/Thurs/saturday schedule  Pt was dialyzed on Thursday ,missed Saturday session                Pt will be dialyzed today   2)HTN BP not at goal  Hd should help   3)Anemia In ESRD the goal for HGb is 9--11. Hgb at goal Will keep on epo  4)CKD Mineral-Bone Disorder Phosphorus on higher side. Calcium is  at goal.  5)CHF- Pt admitted with pulmonary edema PMD following  6)Electrolytes  Normokalemic NOrmonatremic   7)Acid base Co2 at goal     Plan:  Will dialyze today Will try to take 2.5 liters off Will keep on epo   Addendum Pt seen on HD   Heavyn Yearsley S 10/29/2016, 9:10 AM

## 2016-10-29 NOTE — ED Notes (Signed)
Gave report to [employer] Room being cleaned. Will call back in approx 30-45 min to see if can bring pt up. Family aware

## 2016-10-29 NOTE — H&P (Signed)
History and Physical    Patricia Ayala:[id number] DOB: Aug 16, 1929 DOA: 10/29/2016  PCP: [name]., MD  Patient coming from: home  Chief Complaint: shortness of breath  HPI: Patricia Ayala is a 83 y.o. female with medical history significant of end-stage renal disease on hemodialysis, chronic diastolic congestive heart failure, hypertension who presents to the hospital with complaints of shortness of breath. Patient's usual dialysis schedule is on Tuesday, Thursday, Saturday. She missed her dialysis session on Saturday, since she did not feel well and felt that she was having a cold. Her last dialysis session was Thursday. She woke up this morning with sudden onset of shortness of breath. Denies any fever. She has had a cough for the last several days. Denies any nasal congestion. She has a sore throat which she relates to her repeated coughing. Denies any chest pain. No vomiting or diarrhea.  ED Course: She was evaluated in the emergency room her chest x-ray indicated evidence of decompensated heart failure. She was referred for admission for further dialysis treatments.  Review of Systems: As per HPI otherwise 10 point review of systems negative.    Past Medical History:  Diagnosis Date  . Anemia 02/11/2012  . Anxiety   . Chronic kidney disease    not on dialysis yet, Tues, thurs, sat  . H/O metabolic acidosis   . Hyperparathyroidism, secondary (HCC)   . Hypertension    Dr. Regino Schultze, [place], [place]  . Vitamin D deficiency     Past Surgical History:  Procedure Laterality Date  . ABDOMINAL HYSTERECTOMY    . AV FISTULA PLACEMENT  04/01/2012   Procedure: ARTERIOVENOUS (AV) FISTULA CREATION;  Surgeon: Sherren Kerns, MD;  Location: [facility] OR;  Service: Vascular;  Laterality: Left;  . CHOLECYSTECTOMY    . COLON SURGERY     for a blockage  . EYE SURGERY     bilateral cataracts, /w IOL - 2013  . FISTULOGRAM N/A 11/05/2014   Procedure: FISTULOGRAM;  Surgeon: Larina Earthly, MD;   Location: [facility] CATH LAB;  Service: Cardiovascular;  Laterality: N/A;  . INSERTION OF DIALYSIS CATHETER  04/10/2012   Procedure: INSERTION OF DIALYSIS CATHETER;  Surgeon: Pryor Ochoa, MD;  Location: [facility] OR;  Service: Vascular;  Laterality: N/A;  Insertion Diatek Catheter Right Internal Jugular  . LIGATION OF COMPETING BRANCHES OF ARTERIOVENOUS FISTULA  11/07/2012   Procedure: LIGATION OF COMPETING BRANCHES OF ARTERIOVENOUS FISTULA;  Surgeon: Chuck Hint, MD;  Location: [facility] OR;  Service: Vascular;  Laterality: Left;  Brachio/Cephalic Fistula  . SHUNTOGRAM N/A 11/03/2012   Procedure: Betsey Amen;  Surgeon: Chuck Hint, MD;  Location: [facility] CATH LAB;  Service: Cardiovascular;  Laterality: N/A;  . SHUNTOGRAM Left 03/09/2013   Procedure: Fistulogram;  Surgeon: Fransisco Hertz, MD;  Location: [facility] CATH LAB;  Service: Cardiovascular;  Laterality: Left;  . SHUNTOGRAM Left 11/23/2013   Procedure: FISTULOGRAM;  Surgeon: Fransisco Hertz, MD;  Location: [facility] CATH LAB;  Service: Cardiovascular;  Laterality: Left;     reports that she has never smoked. She has never used smokeless tobacco. She reports that she does not drink alcohol or use drugs.  No Known Allergies  Family History  Problem Relation Age of Onset  . Stroke Mother   . Cancer Father   . Heart attack Father      Prior to Admission medications   Medication Sig Start Date End Date Taking? Authorizing Provider  ALPRAZolam Prudy Feeler) 0.5 MG tablet Take 1 tablet by mouth 3 (  three) times daily as needed for anxiety. 09/21/16  Yes Historical Provider, MD  amLODipine (NORVASC) 10 MG tablet Take 1 tablet (10 mg total) by mouth daily. 09/30/16  Yes Meredeth Ide, MD  BIOTIN PO Take 1,000 mg by mouth daily.    Yes Historical Provider, MD  cinacalcet (SENSIPAR) 30 MG tablet Take 30 mg by mouth at bedtime.   Yes Historical Provider, MD  labetalol (NORMODYNE) 200 MG tablet Take 200 mg by mouth 2 (two) times daily.    Yes Historical Provider, MD    lidocaine-prilocaine (EMLA) cream Apply 1 application topically as needed (for IV access).  01/12/13  Yes Historical Provider, MD  multivitamin (RENA-VIT) TABS tablet Take 1 tablet by mouth daily.   Yes Historical Provider, MD  pantoprazole (PROTONIX) 40 MG tablet Take 1 tablet by mouth daily. 09/03/16  Yes Historical Provider, MD  pentoxifylline (TRENTAL) 400 MG CR tablet Take 400 mg by mouth 3 (three) times daily with meals.   Yes Historical Provider, MD  promethazine (PHENERGAN) 25 MG tablet Take 25 mg by mouth every 6 (six) hours as needed for nausea or vomiting.   Yes Historical Provider, MD  sevelamer carbonate (RENVELA) 800 MG tablet Take 2,400 mg by mouth 3 (three) times daily with meals. Take 800mg  with snacks daily.   Yes Historical Provider, MD  simvastatin (ZOCOR) 10 MG tablet Take 10 mg by mouth at bedtime.  02/06/13  Yes Historical Provider, MD    Physical Exam: Vitals:   10/29/16 1530 10/29/16 1600 10/29/16 1630 10/29/16 1700  BP: (!) 170/82 (!) 167/86 (!) 149/73 (!) 150/82  Pulse: (!) 58 67 64 69  Resp:      Temp:      TempSrc:      SpO2:      Weight:      Height:          Constitutional: NAD, calm, comfortable Vitals:   10/29/16 1530 10/29/16 1600 10/29/16 1630 10/29/16 1700  BP: (!) 170/82 (!) 167/86 (!) 149/73 (!) 150/82  Pulse: (!) 58 67 64 69  Resp:      Temp:      TempSrc:      SpO2:      Weight:      Height:       Eyes: PERRL, lids and conjunctivae normal ENMT: Mucous membranes are moist. Posterior pharynx clear of any exudate or lesions.Normal dentition.  Neck: normal, supple, no masses, no thyromegaly Respiratory: clear to auscultation bilaterally, no wheezing, no crackles. Normal respiratory effort. No accessory muscle use.  Cardiovascular: Regular rate and rhythm, no murmurs / rubs / gallops. 1+ extremity edema. 2+ pedal pulses. No carotid bruits.  Abdomen: no tenderness, no masses palpated. No hepatosplenomegaly. Bowel sounds positive.   Musculoskeletal: no clubbing / cyanosis. No joint deformity upper and lower extremities. Good ROM, no contractures. Normal muscle tone.  Skin: no rashes, lesions, ulcers. No induration Neurologic: CN 2-12 grossly intact. Sensation intact, DTR normal. Strength 5/5 in all 4.  Psychiatric: Normal judgment and insight. Alert and oriented x 3. Normal mood.    Labs on Admission: I have personally reviewed following labs and imaging studies  CBC:  Recent Labs Lab 10/29/16 0740  WBC 12.8*  NEUTROABS 10.1*  HGB 10.6*  HCT 33.8*  MCV 97.4  PLT 247   Basic Metabolic Panel:  Recent Labs Lab 10/29/16 0740  NA 141  K 4.4  CL 105  CO2 27  GLUCOSE 126*  BUN 57*  CREATININE 9.92*  CALCIUM 9.3  PHOS 5.6*   GFR: Estimated Creatinine Clearance: 4.2 mL/min (by C-G formula based on SCr of 9.92 mg/dL (H)). Liver Function Tests:  Recent Labs Lab 10/29/16 0740  ALBUMIN 3.7   No results for input(s): LIPASE, AMYLASE in the last 168 hours. No results for input(s): AMMONIA in the last 168 hours. Coagulation Profile: No results for input(s): INR, PROTIME in the last 168 hours. Cardiac Enzymes:  Recent Labs Lab 10/29/16 0740  TROPONINI 0.05*   BNP (last 3 results) No results for input(s): PROBNP in the last 8760 hours. HbA1C: No results for input(s): HGBA1C in the last 72 hours. CBG: No results for input(s): GLUCAP in the last 168 hours. Lipid Profile: No results for input(s): CHOL, HDL, LDLCALC, TRIG, CHOLHDL, LDLDIRECT in the last 72 hours. Thyroid Function Tests: No results for input(s): TSH, T4TOTAL, FREET4, T3FREE, THYROIDAB in the last 72 hours. Anemia Panel: No results for input(s): VITAMINB12, FOLATE, FERRITIN, TIBC, IRON, RETICCTPCT in the last 72 hours. Urine analysis:    Component Value Date/Time   COLORURINE YELLOW 09/24/2015 1341   APPEARANCEUR CLEAR 09/24/2015 1341   LABSPEC 1.010 09/24/2015 1341   PHURINE 8.0 09/24/2015 1341   GLUCOSEU 100 (A) 09/24/2015  1341   HGBUR MODERATE (A) 09/24/2015 1341   BILIRUBINUR NEGATIVE 09/24/2015 1341   KETONESUR NEGATIVE 09/24/2015 1341   PROTEINUR 100 (A) 09/24/2015 1341   NITRITE NEGATIVE 09/24/2015 1341   LEUKOCYTESUR NEGATIVE 09/24/2015 1341   Sepsis Labs: !!!!!!!!!!!!!!!!!!!!!!!!!!!!!!!!!!!!!!!!!!!! @LABRCNTIP (procalcitonin:4,lacticidven:4) )No results found for this or any previous visit (from the past 240 hour(s)).   Radiological Exams on Admission: Dg Chest Portable 1 View  Result Date: 10/29/2016 CLINICAL DATA:  woke up feeling sob this morning. --congested cough since arriving. --swelling to bilateral lower legs - EXAM: PORTABLE CHEST 1 VIEW COMPARISON:  09/26/2016 FINDINGS: Moderate enlargement of the cardiopericardial silhouette. No mediastinal or hilar masses. There is central vascular congestion and bilateral interstitial thickening with small pleural effusions. Findings are similar to the most recent prior exam consistent with congestive heart failure. No pneumothorax. IMPRESSION: 1. Congestive heart failure similar in appearance to the most recent prior chest radiograph. Electronically Signed   By: Amie Portland M.D.   On: 10/29/2016 08:03    EKG: Independently reviewed. No acute changes  Assessment/Plan Active Problems:   Pulmonary edema   ESRD (end stage renal disease) (HCC)   Benign essential HTN   GERD (gastroesophageal reflux disease)   HLD (hyperlipidemia)   Acute on chronic diastolic CHF (congestive heart failure) (HCC)    1. Acute on chronic diastolic congestive heart failure. Likely due to noncompliance with dialysis. She is currently undergoing dialysis with fluid removal and is feeling better. We'll discuss with nephrology if she will undergo another treatment tomorrow. Can probably discharge tomorrow after her treatment.  2. End-stage renal disease on hemodialysis. Dialysis per nephrology  3. Hypertension. Continue home regimen  4. Hyperlipidemia. Continue statin  5.  GERD. Continue PPI   DVT prophylaxis: heparin Code Status: full code Family Communication: no family present Disposition Plan: discharge home once improved Consults called: nephrology Admission status: observation, telemetry   Jacion Dismore MD [facility] Pager [phone]  If 7PM-7AM, please contact night-coverage [email or web address] Password [email or web address]  10/29/2016, 6:01 PM

## 2016-10-29 NOTE — ED Triage Notes (Signed)
Pt reports she woke up feeling sob this morning.  Denies fever and cough however pt has had congested cough since arriving.  Also has swelling to bilateral lower legs but says isn't new.  Denies pain.

## 2016-10-29 NOTE — ED Provider Notes (Signed)
[facility]-EMERGENCY DEPT Provider Note   CSN: [id number] Arrival date & time: 10/29/16  0724     History   Chief Complaint Chief Complaint  Patient presents with  . Shortness of Breath    HPI Patricia Ayala is a 83 y.o. female.  HPI   83 y.o. female with medical history significant of end-stage renal disease on hemodialysis, chronic diastolic congestive heart failure, hypertension who presents to the hospital with complaints of shortness of breath. Patient's usual dialysis schedule is on Tuesday, Thursday, Saturday. She missed her dialysis session on Saturday, since she did not feel well and felt that she was having a cold. Her last dialysis session was Thursday. She woke up this morning with sudden onset of shortness of breath. Denies any fever. She has had a cough for the last several days. Denies any nasal congestion. She has a sore throat which she relates to her repeated coughing. Denies any chest pain. No vomiting or diarrhea.  Past Medical History:  Diagnosis Date  . Anemia 02/11/2012  . Anxiety   . Chronic kidney disease    not on dialysis yet, Tues, thurs, sat  . H/O metabolic acidosis   . Hyperparathyroidism, secondary (HCC)   . Hypertension    Dr. Regino Schultze, [place], Standard  . Vitamin D deficiency     Patient Active Problem List   Diagnosis Date Noted  . Pulmonary edema 09/26/2016  . Mechanical complication of other vascular device, implant, and graft 08/07/2013  . End stage renal disease (HCC) 03/27/2012  . Anemia 02/11/2012    Past Surgical History:  Procedure Laterality Date  . ABDOMINAL HYSTERECTOMY    . AV FISTULA PLACEMENT  04/01/2012   Procedure: ARTERIOVENOUS (AV) FISTULA CREATION;  Surgeon: Sherren Kerns, MD;  Location: [facility] OR;  Service: Vascular;  Laterality: Left;  . CHOLECYSTECTOMY    . COLON SURGERY     for a blockage  . EYE SURGERY     bilateral cataracts, /w IOL - 2013  . FISTULOGRAM N/A 11/05/2014   Procedure: FISTULOGRAM;  Surgeon: Larina Earthly,  MD;  Location: [facility] CATH LAB;  Service: Cardiovascular;  Laterality: N/A;  . INSERTION OF DIALYSIS CATHETER  04/10/2012   Procedure: INSERTION OF DIALYSIS CATHETER;  Surgeon: Pryor Ochoa, MD;  Location: [facility] OR;  Service: Vascular;  Laterality: N/A;  Insertion Diatek Catheter Right Internal Jugular  . LIGATION OF COMPETING BRANCHES OF ARTERIOVENOUS FISTULA  11/07/2012   Procedure: LIGATION OF COMPETING BRANCHES OF ARTERIOVENOUS FISTULA;  Surgeon: Chuck Hint, MD;  Location: [facility] OR;  Service: Vascular;  Laterality: Left;  Brachio/Cephalic Fistula  . SHUNTOGRAM N/A 11/03/2012   Procedure: Betsey Amen;  Surgeon: Chuck Hint, MD;  Location: [facility] CATH LAB;  Service: Cardiovascular;  Laterality: N/A;  . SHUNTOGRAM Left 03/09/2013   Procedure: Fistulogram;  Surgeon: Fransisco Hertz, MD;  Location: [facility] CATH LAB;  Service: Cardiovascular;  Laterality: Left;  . SHUNTOGRAM Left 11/23/2013   Procedure: FISTULOGRAM;  Surgeon: Fransisco Hertz, MD;  Location: [facility] CATH LAB;  Service: Cardiovascular;  Laterality: Left;    OB History    No data available       Home Medications    Prior to Admission medications   Medication Sig Start Date End Date Taking? Authorizing Provider  ALPRAZolam Prudy Feeler) 0.5 MG tablet Take 1 tablet by mouth 3 (three) times daily as needed for anxiety. 09/21/16   Historical Provider, MD  amLODipine (NORVASC) 10 MG tablet Take 1 tablet (10 mg total) by mouth  daily. 09/30/16   Meredeth Ide, MD  BIOTIN PO Take 1,000 mg by mouth daily.     Historical Provider, MD  labetalol (NORMODYNE) 200 MG tablet Take 200 mg by mouth 2 (two) times daily.     Historical Provider, MD  lidocaine-prilocaine (EMLA) cream Apply 1 application topically as needed (for IV access).  01/12/13   Historical Provider, MD  multivitamin (RENA-VIT) TABS tablet Take 1 tablet by mouth daily.    Historical Provider, MD  pantoprazole (PROTONIX) 40 MG tablet Take 1 tablet by mouth daily. 09/03/16   Historical Provider, MD    promethazine (PHENERGAN) 25 MG tablet Take 25 mg by mouth every 6 (six) hours as needed for nausea or vomiting.    Historical Provider, MD  simvastatin (ZOCOR) 10 MG tablet Take 10 mg by mouth at bedtime.  02/06/13   Historical Provider, MD  zolpidem (AMBIEN) 10 MG tablet Take 10 mg by mouth at bedtime as needed for sleep.  11/10/13   Historical Provider, MD    Family History Family History  Problem Relation Age of Onset  . Stroke Mother   . Cancer Father   . Heart attack Father     Social History Social History  Substance Use Topics  . Smoking status: Never Smoker  . Smokeless tobacco: Never Used  . Alcohol use No     Allergies   Patient has no known allergies.   Review of Systems Review of Systems  All systems reviewed and negative, other than as noted in HPI.  Physical Exam Updated Vital Signs There were no vitals taken for this visit.  Physical Exam  Constitutional: She appears well-developed and well-nourished. No distress.  HENT:  Head: Normocephalic and atraumatic.  Eyes: Conjunctivae are normal. Right eye exhibits no discharge. Left eye exhibits no discharge.  Neck: Neck supple.  Cardiovascular: Normal rate, regular rhythm and normal heart sounds.  Exam reveals no gallop and no friction rub.   No murmur heard. Pulmonary/Chest: No respiratory distress.  Tachypnea. Crackles b/l bases.   Abdominal: Soft. She exhibits no distension. There is no tenderness.  Musculoskeletal: She exhibits edema. She exhibits no tenderness.  Neurological: She is alert.  Skin: Skin is warm and dry.  Psychiatric: She has a normal mood and affect. Her behavior is normal. Thought content normal.  Nursing note and vitals reviewed.    ED Treatments / Results  Labs (all labs ordered are listed, but only abnormal results are displayed) Labs Reviewed  RENAL FUNCTION PANEL - Abnormal; Notable for the following:       Result Value   Glucose, Bld 126 (*)    BUN 57 (*)    Creatinine,  Ser 9.92 (*)    Phosphorus 5.6 (*)    GFR calc non Af Amer 3 (*)    GFR calc Af Amer 4 (*)    All other components within normal limits  CBC WITH DIFFERENTIAL/PLATELET - Abnormal; Notable for the following:    WBC 12.8 (*)    RBC 3.47 (*)    Hemoglobin 10.6 (*)    HCT 33.8 (*)    Neutro Abs 10.1 (*)    Monocytes Absolute 1.2 (*)    All other components within normal limits  BRAIN NATRIURETIC PEPTIDE - Abnormal; Notable for the following:    B Natriuretic Peptide 1,261.0 (*)    All other components within normal limits  TROPONIN I - Abnormal; Notable for the following:    Troponin I 0.05 (*)  All other components within normal limits  CBC - Abnormal; Notable for the following:    WBC 12.7 (*)    RBC 3.38 (*)    Hemoglobin 10.3 (*)    HCT 32.9 (*)    All other components within normal limits  CREATININE, SERUM - Abnormal; Notable for the following:    Creatinine, Ser 5.23 (*)    GFR calc non Af Amer 7 (*)    GFR calc Af Amer 8 (*)    All other components within normal limits  CBC - Abnormal; Notable for the following:    WBC 11.4 (*)    RBC 3.52 (*)    Hemoglobin 10.8 (*)    HCT 34.0 (*)    All other components within normal limits  BASIC METABOLIC PANEL - Abnormal; Notable for the following:    Chloride 98 (*)    Glucose, Bld 122 (*)    BUN 35 (*)    Creatinine, Ser 6.49 (*)    GFR calc non Af Amer 5 (*)    GFR calc Af Amer 6 (*)    All other components within normal limits  MRSA PCR SCREENING  INFLUENZA PANEL BY PCR (TYPE A & B, H1N1)  HEPATITIS B SURFACE ANTIGEN  HEPATITIS B SURFACE ANTIBODY, QUANTITATIVE  HEPATITIS B CORE ANTIBODY, TOTAL    EKG  EKG Interpretation  Date/Time:  Monday October 29 2016 07:31:32 EST Ventricular Rate:  68 PR Interval:    QRS Duration: 91 QT Interval:  473 QTC Calculation: 504 R Axis:   32 Text Interpretation:  Sinus rhythm Atrial premature complexes Left ventricular hypertrophy Poor data quality Confirmed by Juleen China  MD,  Brigitte Soderberg ([id number]) on 10/29/2016 7:36:34 AM       Radiology No results found.   [place] Lower Ext Art Bilat  Result Date: 10/17/2016 CLINICAL DATA:  Bilateral lower extremity pain for 3 months with walking. EXAM: BILATERAL LOWER EXTREMITY ARTERIAL DUPLEX SCAN TECHNIQUE: Gray-scale sonography as well as color Doppler and duplex ultrasound was performed to evaluate the arteries of both lower extremities. COMPARISON:  None. FINDINGS: There is extensive plaque in the right common femoral artery which is mixed. Maximal velocity in the common femoral artery is 186 cm/sec which is somewhat elevated. Doppler analysis demonstrates a biphasic waveform. Color Doppler imaging demonstrates flow in the proximal superficial femoral artery but occlusion in the mid and distal superficial femoral artery. Flow was not visualized in the popliteal artery. Monophasic flow was present in the anterior tibial artery. There is no evidence of flow in the posterior tibial artery. There is monophasic flow in the peroneal artery. There is extensive plaque within the common femoral artery. Very poor flow is seen at the periphery of the plaque. Flow was not visualized in the superficial femoral or popliteal arteries. Monophasic flow was present in the anterior tibial artery and peroneal arteries. Flow was not visualize in the posterior tibial artery. IMPRESSION: Significant plaque with elevated velocity in the right common femoral artery. Right superficial femoral artery and popliteal artery occlusion are suggested. Monophasic waveforms in the right anterior tibial and peroneal arteries. There is no flow in the posterior tibial artery. Severe disease with subtotal occlusion of the left common femoral artery. Findings suggest occlusion of the left superficial femoral and popliteal arteries. Monophasic waveforms in the left anterior tibial and peroneal arteries. Electronically Signed   By: Jolaine Click M.D.   On: 10/17/2016 15:48   Dg Chest Port  1 View  Result Date: 11/02/2016 CLINICAL  DATA:  PICC line placement. EXAM: PORTABLE CHEST 1 VIEW COMPARISON:  Radiograph of November 01, 2016. FINDINGS: Stable cardiomegaly. Atherosclerosis of thoracic aorta is noted. Central pulmonary vascular congestion is noted. No pneumothorax or significant pleural effusion is noted. Probable mild bibasilar subsegmental atelectasis is noted. Bony thorax is unremarkable. Interval placement of right internal jugular catheter with distal tip in expected position of the SVC. IMPRESSION: Cardiomegaly. Aortic atherosclerosis. Stable central pulmonary vascular congestion. Probable bibasilar subsegmental atelectasis. Interval placement of right internal jugular catheter with distal tip in expected position of SVC. Electronically Signed   By: Lupita Raider, M.D.   On: 11/02/2016 11:41   Dg Chest Port 1 View  Result Date: 11/01/2016 CLINICAL DATA:  Dyspnea this morning EXAM: PORTABLE CHEST 1 VIEW COMPARISON:  10/29/2016 FINDINGS: Marked cardiomegaly, unchanged. Mild vascular prominence. The vascular and interstitial pattern is improved from 10/29/2016. No airspace consolidation or significant alveolar edema. IMPRESSION: Improved from 10/29/2016.  Marked cardiomegaly persists. Electronically Signed   By: Ellery Plunk M.D.   On: 11/01/2016 06:56   Dg Chest Portable 1 View  Result Date: 10/29/2016 CLINICAL DATA:  woke up feeling sob this morning. --congested cough since arriving. --swelling to bilateral lower legs - EXAM: PORTABLE CHEST 1 VIEW COMPARISON:  09/26/2016 FINDINGS: Moderate enlargement of the cardiopericardial silhouette. No mediastinal or hilar masses. There is central vascular congestion and bilateral interstitial thickening with small pleural effusions. Findings are similar to the most recent prior exam consistent with congestive heart failure. No pneumothorax. IMPRESSION: 1. Congestive heart failure similar in appearance to the most recent prior chest radiograph.  Electronically Signed   By: Amie Portland M.D.   On: 10/29/2016 08:03    Procedures Procedures (including critical care time)  Medications Ordered in ED Medications - No data to display   Initial Impression / Assessment and Plan / ED Course  I have reviewed the triage vital signs and the nursing notes.  Pertinent labs & imaging results that were available during my care of the patient were reviewed by me and considered in my medical decision making (see chart for details).  Clinical Course     83 year old female with acute respiratory distress. Acute decompensated heart failure likely secondary to missing dialysis. Nephrology and medicine consultation. Admitted.  Final Clinical Impressions(s) / ED Diagnoses   Final diagnoses:  Acute respiratory distress  ESRD (end stage renal disease) (HCC)    New Prescriptions New Prescriptions   No medications on file     Raeford Razor, MD 11/06/16 1347

## 2016-10-29 NOTE — ED Notes (Signed)
02 [place] 2L placed due to 02 85-89% ra

## 2016-10-29 NOTE — Progress Notes (Signed)
Pt admitted to [phone] from ED.  Pt alert and oriented with pt husband to bedside.  Pt oriented to room environment, call bell given.  HD RN paged and states she will be ready to dialyses pt in approximately 1 hour.  Pt informed.

## 2016-10-30 DIAGNOSIS — J069 Acute upper respiratory infection, unspecified: Secondary | ICD-10-CM | POA: Diagnosis present

## 2016-10-30 LAB — CBC
HCT: 34 % — ABNORMAL LOW (ref 36.0–46.0)
Hemoglobin: 10.8 g/dL — ABNORMAL LOW (ref 12.0–15.0)
MCH: 30.7 pg (ref 26.0–34.0)
MCHC: 31.8 g/dL (ref 30.0–36.0)
MCV: 96.6 fL (ref 78.0–100.0)
PLATELETS: 223 10*3/uL (ref 150–400)
RBC: 3.52 MIL/uL — AB (ref 3.87–5.11)
RDW: 14.1 % (ref 11.5–15.5)
WBC: 11.4 10*3/uL — AB (ref 4.0–10.5)

## 2016-10-30 LAB — BASIC METABOLIC PANEL
ANION GAP: 12 (ref 5–15)
BUN: 35 mg/dL — ABNORMAL HIGH (ref 6–20)
CALCIUM: 8.9 mg/dL (ref 8.9–10.3)
CO2: 27 mmol/L (ref 22–32)
Chloride: 98 mmol/L — ABNORMAL LOW (ref 101–111)
Creatinine, Ser: 6.49 mg/dL — ABNORMAL HIGH (ref 0.44–1.00)
GFR, EST AFRICAN AMERICAN: 6 mL/min — AB (ref >60)
GFR, EST NON AFRICAN AMERICAN: 5 mL/min — AB (ref >60)
Glucose, Bld: 122 mg/dL — ABNORMAL HIGH (ref 65–99)
POTASSIUM: 4 mmol/L (ref 3.5–5.1)
Sodium: 137 mmol/L (ref 135–145)

## 2016-10-30 MED ORDER — ALBUTEROL SULFATE HFA 108 (90 BASE) MCG/ACT IN AERS: 2.0000 | INHALATION_SPRAY | Freq: Four times a day (QID) | RESPIRATORY_TRACT | 2 refills | Status: DC | PRN

## 2016-10-30 MED ORDER — MENTHOL 3 MG MT LOZG: 1.0000 | LOZENGE | OROMUCOSAL | Status: DC | PRN

## 2016-10-30 MED ORDER — GUAIFENESIN ER 600 MG PO TB12: 600.0000 mg | ORAL_TABLET | Freq: Two times a day (BID) | ORAL | 0 refills | Status: DC

## 2016-10-30 MED ORDER — EPOETIN ALFA 3000 UNIT/ML IJ SOLN: 3000.0000 [IU] | INTRAMUSCULAR | Status: DC

## 2016-10-30 NOTE — Progress Notes (Signed)
Patricia Ayala  MRN: [id number]  DOB/AGE: 02/03/1929 83 y.o.  Primary Care Physician:FUSCO,LAWRENCE J., MD  Admit date: 10/29/2016  Chief Complaint:  Chief Complaint  Patient presents with  . Shortness of Breath    S-Pt presented on  10/29/2016 with  Chief Complaint  Patient presents with  . Shortness of Breath  .    Pt today feels better.   Marland Kitchen amLODipine  10 mg Oral Daily  . chlorhexidine  15 mL Mouth Rinse BID  . cinacalcet  30 mg Oral QHS  . epoetin (EPOGEN/PROCRIT) injection  3,000 Units Subcutaneous Q T,Th,Sa-HD  . heparin  5,000 Units Subcutaneous Q8H  . labetalol  200 mg Oral BID  . mouth rinse  15 mL Mouth Rinse q12n4p  . multivitamin  1 tablet Oral Daily  . pantoprazole  40 mg Oral Daily  . pentoxifylline  400 mg Oral Daily  . sevelamer carbonate  2,400 mg Oral TID WC  . simvastatin  10 mg Oral QHS        Physical Exam: Vital signs in last 24 hours: Temp:  [98.1 F (36.7 C)-98.9 F (37.2 C)] 98.3 F (36.8 C) (01/02 8527) Pulse Rate:  [57-82] 57 (01/02 0635) Resp:  [15-22] 20 (01/02 0635) BP: (149-197)/(51-102) 164/51 (01/02 0635) SpO2:  [94 %-99 %] 98 % (01/02 0635) Weight:  [169 lb 11.2 oz (77 kg)-174 lb 6.4 oz (79.1 kg)] 172 lb 9.9 oz (78.3 kg) (01/02 0845) Weight change:  Last BM Date: 10/29/16  Intake/Output from previous day: 01/01 0701 - 01/02 0700 In: -  Out: 2500  No intake/output data recorded.   Physical Exam: General- pt is awake,alert, oriented to time place and person Resp- No acute REsp distress, Rhonchi+ CVS- S1S2 regular in rate and rhythm GIT- BS+, soft, NT, ND EXT- trace LE Edema, NO Cyanosis Access- AVF +    Lab Results: CBC  Recent Labs  10/29/16 1923 10/30/16 0604  WBC 12.7* 11.4*  HGB 10.3* 10.8*  HCT 32.9* 34.0*  PLT 221 223    BMET  Recent Labs  10/29/16 0740 10/29/16 1923 10/30/16 0604  NA 141  --  137  K 4.4  --  4.0  CL 105  --  98*  CO2 27  --  27  GLUCOSE 126*  --  122*  BUN 57*  --  35*   CREATININE 9.92* 5.23* 6.49*  CALCIUM 9.3  --  8.9    MICRO Recent Results (from the past 240 hour(s))  MRSA PCR Screening     Status: None   Collection Time: 10/29/16 12:28 PM  Result Value Ref Range Status   MRSA by PCR NEGATIVE NEGATIVE Final    Comment:        The GeneXpert MRSA Assay (FDA approved for NASAL specimens only), is one component of a comprehensive MRSA colonization surveillance program. It is not intended to diagnose MRSA infection nor to guide or monitor treatment for MRSA infections.       Lab Results  Component Value Date   CALCIUM 8.9 10/30/2016   CAION 1.07 (L) 11/05/2014   PHOS 5.6 (H) 10/29/2016               Impression: 1)Renal  ESRD on HD                Pt is on Tue/Thurs/saturday schedule                Pt was dialyzed on Thursday ,missed Saturday session  Pt will be dialyzed today as it pt scheduled day  2)HTN BP not at goal  Hd should help   3)Anemia In ESRD the goal for HGb is 9--11. Hgb at goal Will keep on epo  4)CKD Mineral-Bone Disorder Phosphorus on higher side.    On binders  Calcium is  at goal.  5)CHF- Pt admitted with pulmonary edema PMD following  6)Electrolytes  Normokalemic NOrmonatremic   7)Acid base Co2 at goal     Plan:  Will dialyze today as its her scheduled day Will try to take 2 liters off   Addendum. Pt seen on HD Pt tolerating tx well.   [name] S 10/30/2016, 9:51 AM

## 2016-10-30 NOTE — Progress Notes (Signed)
SATURATION QUALIFICATIONS: (This note is used to comply with regulatory documentation for home oxygen)  Patient Saturations on Room Air at Rest = 96%  Patient Saturations on Room Air while Ambulating = 97%  Patient Saturations on 0 Liters of oxygen while Ambulating n/a  Please briefly explain why patient needs home oxygen: 

## 2016-10-30 NOTE — Discharge Summary (Signed)
Physician Discharge Summary  Patricia Ayala ZOX:[id number] DOB: 01-Mar-1929 DOA: 10/29/2016  PCP: [name]., MD  Admit date: 10/29/2016 Discharge date: 10/30/2016  Admitted From: home Disposition:  home  Recommendations for Outpatient Follow-up:  1. Follow up with PCP in 1-2 weeks 2. Please obtain BMP/CBC in one week 3. Follow for regularly scheduled dialysis on thursday  Home Health: Equipment/Devices:  Discharge Condition: stable CODE STATUS: full Diet recommendation: Heart Healthy   Brief/Interim Summary: Patricia Ayala is a 83 y.o. female with medical history significant of end-stage renal disease on hemodialysis, chronic diastolic congestive heart failure, hypertension who presents to the hospital with complaints of shortness of breath. Patient's usual dialysis schedule is on Tuesday, Thursday, Saturday. She missed her dialysis session on Saturday, since she did not feel well and felt that she was having a cold. Her last dialysis session was Thursday. She woke up this morning with sudden onset of shortness of breath. Denies any fever. She has had a cough for the last several days. Denies any nasal congestion. She has a sore throat which she relates to her repeated coughing. Denies any chest pain. No vomiting or diarrhea.  ED Course: She was evaluated in the emergency room her chest x-ray indicated evidence of decompensated heart failure. She was referred for admission for further dialysis treatments.  Discharge Diagnoses:  Active Problems:   Pulmonary edema   ESRD (end stage renal disease) (HCC)   Benign essential HTN   GERD (gastroesophageal reflux disease)   HLD (hyperlipidemia)   Acute on chronic diastolic CHF (congestive heart failure) (HCC)   URI (upper respiratory infection)  Patient was monitored in the hospital overnight. She underwent a dialysis session on the day of admission, and another one on the following day. With fluid removal, her respiratory status has  improved. She is no longer short of breath or requiring supplemental oxygen. She is able to ambulate on room air without difficulty. She continues to have sore throat and some rhonchi on exam and likely has an upper respiratory tract infection. She is afebrile. Will treat supportively at this time with bronchodilators and mucolytics. She's been advised to follow-up with her regular dialysis center on Thursday for her scheduled treatment. She is otherwise stable for discharge home.   Discharge Instructions  Discharge Instructions    Diet - low sodium heart healthy    Complete by:  As directed    Diet - low sodium heart healthy    Complete by:  As directed    Increase activity slowly    Complete by:  As directed    Increase activity slowly    Complete by:  As directed      Allergies as of 10/30/2016   No Known Allergies     Medication List    TAKE these medications   albuterol 108 (90 Base) MCG/ACT inhaler Commonly known as:  PROVENTIL HFA;VENTOLIN HFA Inhale 2 puffs into the lungs every 6 (six) hours as needed for wheezing or shortness of breath.   ALPRAZolam 0.5 MG tablet Commonly known as:  XANAX Take 1 tablet by mouth 3 (three) times daily as needed for anxiety.   amLODipine 10 MG tablet Commonly known as:  NORVASC Take 1 tablet (10 mg total) by mouth daily.   BIOTIN PO Take 1,000 mg by mouth daily.   cinacalcet 30 MG tablet Commonly known as:  SENSIPAR Take 30 mg by mouth at bedtime.   guaiFENesin 600 MG 12 hr tablet Commonly known as:  MUCINEX  Take 1 tablet (600 mg total) by mouth 2 (two) times daily.   labetalol 200 MG tablet Commonly known as:  NORMODYNE Take 200 mg by mouth 2 (two) times daily.   lidocaine-prilocaine cream Commonly known as:  EMLA Apply 1 application topically as needed (for IV access).   multivitamin Tabs tablet Take 1 tablet by mouth daily.   pantoprazole 40 MG tablet Commonly known as:  PROTONIX Take 1 tablet by mouth daily.    pentoxifylline 400 MG CR tablet Commonly known as:  TRENTAL Take 400 mg by mouth 3 (three) times daily with meals.   promethazine 25 MG tablet Commonly known as:  PHENERGAN Take 25 mg by mouth every 6 (six) hours as needed for nausea or vomiting.   sevelamer carbonate 800 MG tablet Commonly known as:  RENVELA Take 2,400 mg by mouth 3 (three) times daily with meals. Take 800mg  with snacks daily.   simvastatin 10 MG tablet Commonly known as:  ZOCOR Take 10 mg by mouth at bedtime.      Follow-up Information    [name]., MD Follow up on 11/01/2016.   Specialty:  Internal Medicine Why:  appointment time 2:30PM Contact information: [address] [place] [address] [phone]          No Known Allergies  Consultations: Nephrology   Procedures/Studies: [place] Lower Ext Art Bilat  Result Date: 10/17/2016 CLINICAL DATA:  Bilateral lower extremity pain for 3 months with walking. EXAM: BILATERAL LOWER EXTREMITY ARTERIAL DUPLEX SCAN TECHNIQUE: Gray-scale sonography as well as color Doppler and duplex ultrasound was performed to evaluate the arteries of both lower extremities. COMPARISON:  None. FINDINGS: There is extensive plaque in the right common femoral artery which is mixed. Maximal velocity in the common femoral artery is 186 cm/sec which is somewhat elevated. Doppler analysis demonstrates a biphasic waveform. Color Doppler imaging demonstrates flow in the proximal superficial femoral artery but occlusion in the mid and distal superficial femoral artery. Flow was not visualized in the popliteal artery. Monophasic flow was present in the anterior tibial artery. There is no evidence of flow in the posterior tibial artery. There is monophasic flow in the peroneal artery. There is extensive plaque within the common femoral artery. Very poor flow is seen at the periphery of the plaque. Flow was not visualized in the superficial femoral or popliteal arteries. Monophasic  flow was present in the anterior tibial artery and peroneal arteries. Flow was not visualize in the posterior tibial artery. IMPRESSION: Significant plaque with elevated velocity in the right common femoral artery. Right superficial femoral artery and popliteal artery occlusion are suggested. Monophasic waveforms in the right anterior tibial and peroneal arteries. There is no flow in the posterior tibial artery. Severe disease with subtotal occlusion of the left common femoral artery. Findings suggest occlusion of the left superficial femoral and popliteal arteries. Monophasic waveforms in the left anterior tibial and peroneal arteries. Electronically Signed   By: Jolaine Click M.D.   On: 10/17/2016 15:48   Dg Chest Portable 1 View  Result Date: 10/29/2016 CLINICAL DATA:  woke up feeling sob this morning. --congested cough since arriving. --swelling to bilateral lower legs - EXAM: PORTABLE CHEST 1 VIEW COMPARISON:  09/26/2016 FINDINGS: Moderate enlargement of the cardiopericardial silhouette. No mediastinal or hilar masses. There is central vascular congestion and bilateral interstitial thickening with small pleural effusions. Findings are similar to the most recent prior exam consistent with congestive heart failure. No pneumothorax. IMPRESSION: 1. Congestive heart failure similar in appearance to  the most recent prior chest radiograph. Electronically Signed   By: Amie Portland M.D.   On: 10/29/2016 08:03      Subjective: Shortness of breath improved. Still has sore throat  Discharge Exam: Vitals:   10/30/16 1300 10/30/16 1310  BP: 140/71 (!) 145/68  Pulse: 60 66  Resp:  20  Temp:  97.9 F (36.6 C)   Vitals:   10/30/16 1200 10/30/16 1230 10/30/16 1300 10/30/16 1310  BP: (!) 125/59 (!) 122/54 140/71 (!) 145/68  Pulse: 63 (!) 58 60 66  Resp:    20  Temp:    97.9 F (36.6 C)  TempSrc:    Oral  SpO2:      Weight:      Height:        General: Pt is alert, awake, not in acute  distress Cardiovascular: RRR, S1/S2 +, no rubs, no gallops Respiratory: CTA bilaterally, no wheezing, no rhonchi Abdominal: Soft, NT, ND, bowel sounds + Extremities: no edema, no cyanosis    The results of significant diagnostics from this hospitalization (including imaging, microbiology, ancillary and laboratory) are listed below for reference.     Microbiology: Recent Results (from the past 240 hour(s))  MRSA PCR Screening     Status: None   Collection Time: 10/29/16 12:28 PM  Result Value Ref Range Status   MRSA by PCR NEGATIVE NEGATIVE Final    Comment:        The GeneXpert MRSA Assay (FDA approved for NASAL specimens only), is one component of a comprehensive MRSA colonization surveillance program. It is not intended to diagnose MRSA infection nor to guide or monitor treatment for MRSA infections.      Labs: BNP (last 3 results)  Recent Labs  09/26/16 0629 10/29/16 0740  BNP 1,570.0* 1,261.0*   Basic Metabolic Panel:  Recent Labs Lab 10/29/16 0740 10/29/16 1923 10/30/16 0604  NA 141  --  137  K 4.4  --  4.0  CL 105  --  98*  CO2 27  --  27  GLUCOSE 126*  --  122*  BUN 57*  --  35*  CREATININE 9.92* 5.23* 6.49*  CALCIUM 9.3  --  8.9  PHOS 5.6*  --   --    Liver Function Tests:  Recent Labs Lab 10/29/16 0740  ALBUMIN 3.7   No results for input(s): LIPASE, AMYLASE in the last 168 hours. No results for input(s): AMMONIA in the last 168 hours. CBC:  Recent Labs Lab 10/29/16 0740 10/29/16 1923 10/30/16 0604  WBC 12.8* 12.7* 11.4*  NEUTROABS 10.1*  --   --   HGB 10.6* 10.3* 10.8*  HCT 33.8* 32.9* 34.0*  MCV 97.4 97.3 96.6  PLT 247 221 223   Cardiac Enzymes:  Recent Labs Lab 10/29/16 0740  TROPONINI 0.05*   BNP: Invalid input(s): POCBNP CBG: No results for input(s): GLUCAP in the last 168 hours. D-Dimer No results for input(s): DDIMER in the last 72 hours. Hgb A1c No results for input(s): HGBA1C in the last 72 hours. Lipid  Profile No results for input(s): CHOL, HDL, LDLCALC, TRIG, CHOLHDL, LDLDIRECT in the last 72 hours. Thyroid function studies No results for input(s): TSH, T4TOTAL, T3FREE, THYROIDAB in the last 72 hours.  Invalid input(s): FREET3 Anemia work up No results for input(s): VITAMINB12, FOLATE, FERRITIN, TIBC, IRON, RETICCTPCT in the last 72 hours. Urinalysis    Component Value Date/Time   COLORURINE YELLOW 09/24/2015 1341   APPEARANCEUR CLEAR 09/24/2015 1341   LABSPEC 1.010 09/24/2015  1341   PHURINE 8.0 09/24/2015 1341   GLUCOSEU 100 (A) 09/24/2015 1341   HGBUR MODERATE (A) 09/24/2015 1341   BILIRUBINUR NEGATIVE 09/24/2015 1341   KETONESUR NEGATIVE 09/24/2015 1341   PROTEINUR 100 (A) 09/24/2015 1341   NITRITE NEGATIVE 09/24/2015 1341   LEUKOCYTESUR NEGATIVE 09/24/2015 1341   Sepsis Labs Invalid input(s): PROCALCITONIN,  WBC,  LACTICIDVEN Microbiology Recent Results (from the past 240 hour(s))  MRSA PCR Screening     Status: None   Collection Time: 10/29/16 12:28 PM  Result Value Ref Range Status   MRSA by PCR NEGATIVE NEGATIVE Final    Comment:        The GeneXpert MRSA Assay (FDA approved for NASAL specimens only), is one component of a comprehensive MRSA colonization surveillance program. It is not intended to diagnose MRSA infection nor to guide or monitor treatment for MRSA infections.      Time coordinating discharge: Over 30 minutes  SIGNED:   Erick Blinks, MD  [facility] 10/30/2016, 4:12 PM Pager   If 7PM-7AM, please contact night-coverage [email or web address] Password [email or web address]

## 2016-10-30 NOTE — Progress Notes (Signed)
Discharge instructions given to patient,and family,who verbalized understanding. Prescription sent to Pharmacy of choice documented on AVS. Staff accompanied patient to an awaiting vehicle.

## 2016-10-30 NOTE — Care Management Obs Status (Signed)
MEDICARE OBSERVATION STATUS NOTIFICATION   Patient Details  Name: Patricia Ayala MRN: [id number] Date of Birth: 04-28-1929   Medicare Observation Status Notification Given:  Yes    Harmon Bommarito, Chrystine Oiler, RN 10/30/2016, 3:28 PM

## 2016-10-30 NOTE — Care Management Note (Addendum)
Case Management Note  Patient Details  Name: Patricia Ayala MRN: [id number] Date of Birth: Oct 09, 1929  Subjective/Objective:                  Chart reviewed for CM needs. No needs identified. She is from home, lives with her husband .  She has PCP, drive herself to appointments. She has no DME or HH services PTA. She uses [facility] for medications. She is on HD T/TH/Sa at [facility] in [place].   Action/Plan: Per attending patient will DC today after dialysis.   Later entry: spoke with patient and husband, No CM needs.   Expected Discharge Date:       10/30/2016           Expected Discharge Plan:  Home/Self Care  In-House Referral:  NA  Discharge planning Services  CM Consult  Post Acute Care Choice:  NA Choice offered to:  NA  DME Arranged:    DME Agency:     HH Arranged:    HH Agency:     Status of Service:  Completed, signed off  If discussed at [employer] of Stay Meetings, dates discussed:    Additional Comments:  Dennie Moltz, Chrystine Oiler, RN 10/30/2016, 12:52 PM

## 2016-10-31 LAB — HEPATITIS B CORE ANTIBODY, TOTAL: Hep B Core Total Ab: NEGATIVE

## 2016-10-31 LAB — HEPATITIS B SURFACE ANTIGEN: Hepatitis B Surface Ag: NEGATIVE

## 2016-10-31 LAB — HEPATITIS B SURFACE ANTIBODY, QUANTITATIVE: Hep B S AB Quant (Post): 221.2 m[IU]/mL (ref >9.9)

## 2016-11-01 ENCOUNTER — Inpatient Hospital Stay (HOSPITAL_COMMUNITY)
Admission: EM | Admit: 2016-11-01 | Discharge: 2016-11-07 | DRG: 308 | Disposition: A | Discharge status: Skilled Nursing Facility | Payer: Medicare Other | Attending: Internal Medicine | Admitting: Internal Medicine

## 2016-11-01 ENCOUNTER — Emergency Department (HOSPITAL_COMMUNITY): Payer: Medicare Other

## 2016-11-01 ENCOUNTER — Encounter (HOSPITAL_COMMUNITY): Payer: Self-pay

## 2016-11-01 DIAGNOSIS — R748 Abnormal levels of other serum enzymes: Secondary | ICD-10-CM | POA: Diagnosis not present

## 2016-11-01 DIAGNOSIS — M6281 Muscle weakness (generalized): Secondary | ICD-10-CM

## 2016-11-01 DIAGNOSIS — I5032 Chronic diastolic (congestive) heart failure: Secondary | ICD-10-CM | POA: Diagnosis present

## 2016-11-01 DIAGNOSIS — E8770 Fluid overload, unspecified: Secondary | ICD-10-CM | POA: Diagnosis not present

## 2016-11-01 DIAGNOSIS — F419 Anxiety disorder, unspecified: Secondary | ICD-10-CM | POA: Diagnosis present

## 2016-11-01 DIAGNOSIS — N186 End stage renal disease: Secondary | ICD-10-CM | POA: Diagnosis not present

## 2016-11-01 DIAGNOSIS — J069 Acute upper respiratory infection, unspecified: Secondary | ICD-10-CM | POA: Diagnosis not present

## 2016-11-01 DIAGNOSIS — R0602 Shortness of breath: Secondary | ICD-10-CM | POA: Diagnosis present

## 2016-11-01 DIAGNOSIS — J9600 Acute respiratory failure, unspecified whether with hypoxia or hypercapnia: Secondary | ICD-10-CM | POA: Diagnosis present

## 2016-11-01 DIAGNOSIS — I501 Left ventricular failure: Secondary | ICD-10-CM | POA: Diagnosis not present

## 2016-11-01 DIAGNOSIS — J40 Bronchitis, not specified as acute or chronic: Secondary | ICD-10-CM | POA: Diagnosis present

## 2016-11-01 DIAGNOSIS — I10 Essential (primary) hypertension: Secondary | ICD-10-CM | POA: Diagnosis not present

## 2016-11-01 DIAGNOSIS — I132 Hypertensive heart and chronic kidney disease with heart failure and with stage 5 chronic kidney disease, or end stage renal disease: Secondary | ICD-10-CM | POA: Diagnosis present

## 2016-11-01 DIAGNOSIS — R262 Difficulty in walking, not elsewhere classified: Secondary | ICD-10-CM | POA: Diagnosis not present

## 2016-11-01 DIAGNOSIS — J9811 Atelectasis: Secondary | ICD-10-CM | POA: Diagnosis present

## 2016-11-01 DIAGNOSIS — Z992 Dependence on renal dialysis: Secondary | ICD-10-CM

## 2016-11-01 DIAGNOSIS — D631 Anemia in chronic kidney disease: Secondary | ICD-10-CM | POA: Diagnosis present

## 2016-11-01 DIAGNOSIS — I517 Cardiomegaly: Secondary | ICD-10-CM | POA: Diagnosis not present

## 2016-11-01 DIAGNOSIS — Z961 Presence of intraocular lens: Secondary | ICD-10-CM | POA: Diagnosis present

## 2016-11-01 DIAGNOSIS — R279 Unspecified lack of coordination: Secondary | ICD-10-CM | POA: Diagnosis not present

## 2016-11-01 DIAGNOSIS — I4891 Unspecified atrial fibrillation: Principal | ICD-10-CM | POA: Diagnosis present

## 2016-11-01 DIAGNOSIS — Z452 Encounter for adjustment and management of vascular access device: Secondary | ICD-10-CM | POA: Diagnosis not present

## 2016-11-01 DIAGNOSIS — Z9841 Cataract extraction status, right eye: Secondary | ICD-10-CM

## 2016-11-01 DIAGNOSIS — N2581 Secondary hyperparathyroidism of renal origin: Secondary | ICD-10-CM | POA: Diagnosis not present

## 2016-11-01 DIAGNOSIS — D649 Anemia, unspecified: Secondary | ICD-10-CM | POA: Diagnosis not present

## 2016-11-01 DIAGNOSIS — M79661 Pain in right lower leg: Secondary | ICD-10-CM | POA: Diagnosis not present

## 2016-11-01 DIAGNOSIS — Z79899 Other long term (current) drug therapy: Secondary | ICD-10-CM | POA: Diagnosis not present

## 2016-11-01 DIAGNOSIS — R0600 Dyspnea, unspecified: Secondary | ICD-10-CM | POA: Diagnosis not present

## 2016-11-01 DIAGNOSIS — E785 Hyperlipidemia, unspecified: Secondary | ICD-10-CM | POA: Diagnosis not present

## 2016-11-01 DIAGNOSIS — J9601 Acute respiratory failure with hypoxia: Secondary | ICD-10-CM | POA: Diagnosis present

## 2016-11-01 DIAGNOSIS — R2689 Other abnormalities of gait and mobility: Secondary | ICD-10-CM

## 2016-11-01 DIAGNOSIS — I4892 Unspecified atrial flutter: Secondary | ICD-10-CM | POA: Diagnosis present

## 2016-11-01 DIAGNOSIS — I120 Hypertensive chronic kidney disease with stage 5 chronic kidney disease or end stage renal disease: Secondary | ICD-10-CM | POA: Diagnosis not present

## 2016-11-01 DIAGNOSIS — K219 Gastro-esophageal reflux disease without esophagitis: Secondary | ICD-10-CM | POA: Diagnosis not present

## 2016-11-01 DIAGNOSIS — Z9842 Cataract extraction status, left eye: Secondary | ICD-10-CM | POA: Diagnosis not present

## 2016-11-01 DIAGNOSIS — R52 Pain, unspecified: Secondary | ICD-10-CM

## 2016-11-01 DIAGNOSIS — Z9981 Dependence on supplemental oxygen: Secondary | ICD-10-CM | POA: Diagnosis not present

## 2016-11-01 LAB — TROPONIN I
TROPONIN I: 0.07 ng/mL — AB (ref <0.03)
Troponin I: 0.06 ng/mL (ref <0.03)

## 2016-11-01 LAB — CBC WITH DIFFERENTIAL/PLATELET
BASOS ABS: 0 10*3/uL (ref 0.0–0.1)
Basophils Relative: 0 %
EOS ABS: 0.1 10*3/uL (ref 0.0–0.7)
Eosinophils Relative: 1 %
HEMATOCRIT: 33.8 % — AB (ref 36.0–46.0)
Hemoglobin: 10.6 g/dL — ABNORMAL LOW (ref 12.0–15.0)
Lymphocytes Relative: 4 %
Lymphs Abs: 0.5 10*3/uL — ABNORMAL LOW (ref 0.7–4.0)
MCH: 30.1 pg (ref 26.0–34.0)
MCHC: 31.4 g/dL (ref 30.0–36.0)
MCV: 96 fL (ref 78.0–100.0)
MONO ABS: 1.6 10*3/uL — AB (ref 0.1–1.0)
Monocytes Relative: 12 %
NEUTROS ABS: 10.5 10*3/uL — AB (ref 1.7–7.7)
Neutrophils Relative %: 83 %
PLATELETS: 216 10*3/uL (ref 150–400)
RBC: 3.52 MIL/uL — ABNORMAL LOW (ref 3.87–5.11)
RDW: 14 % (ref 11.5–15.5)
WBC: 12.6 10*3/uL — ABNORMAL HIGH (ref 4.0–10.5)

## 2016-11-01 LAB — COMPREHENSIVE METABOLIC PANEL
ALBUMIN: 3.3 g/dL — AB (ref 3.5–5.0)
ALK PHOS: 66 U/L (ref 38–126)
ALT: 33 U/L (ref 14–54)
ANION GAP: 14 (ref 5–15)
AST: 28 U/L (ref 15–41)
BILIRUBIN TOTAL: 1.3 mg/dL — AB (ref 0.3–1.2)
BUN: 55 mg/dL — ABNORMAL HIGH (ref 6–20)
CALCIUM: 9.2 mg/dL (ref 8.9–10.3)
CO2: 26 mmol/L (ref 22–32)
CREATININE: 7.72 mg/dL — AB (ref 0.44–1.00)
Chloride: 93 mmol/L — ABNORMAL LOW (ref 101–111)
GFR calc non Af Amer: 4 mL/min — ABNORMAL LOW (ref >60)
GFR, EST AFRICAN AMERICAN: 5 mL/min — AB (ref >60)
GLUCOSE: 137 mg/dL — AB (ref 65–99)
Potassium: 4.2 mmol/L (ref 3.5–5.1)
Sodium: 133 mmol/L — ABNORMAL LOW (ref 135–145)
TOTAL PROTEIN: 7 g/dL (ref 6.5–8.1)

## 2016-11-01 LAB — BRAIN NATRIURETIC PEPTIDE: B NATRIURETIC PEPTIDE 5: 1962 pg/mL — AB (ref 0.0–100.0)

## 2016-11-01 LAB — I-STAT CG4 LACTIC ACID, ED: Lactic Acid, Venous: 1.5 mmol/L (ref 0.5–1.9)

## 2016-11-01 MED ORDER — ENOXAPARIN SODIUM 80 MG/0.8ML ~~LOC~~ SOLN
1.0000 mg/kg | Freq: Once | SUBCUTANEOUS | Status: AC
  Administered 2016-11-01: 08:00:00 80 mg via SUBCUTANEOUS
  Filled 2016-11-01: qty 0.8

## 2016-11-01 MED ORDER — PANTOPRAZOLE SODIUM 40 MG PO TBEC
40.0000 mg | DELAYED_RELEASE_TABLET | Freq: Every day | ORAL | Status: DC
  Administered 2016-11-01 – 2016-11-07 (×7): 40 mg via ORAL
  Filled 2016-11-01 (×7): qty 1

## 2016-11-01 MED ORDER — DILTIAZEM LOAD VIA INFUSION
20.0000 mg | Freq: Once | INTRAVENOUS | Status: AC
  Administered 2016-11-01: 20 mg via INTRAVENOUS
  Filled 2016-11-01: qty 20

## 2016-11-01 MED ORDER — DILTIAZEM HCL 100 MG IV SOLR
5.0000 mg/h | INTRAVENOUS | Status: DC
  Administered 2016-11-01: 15 mg/h via INTRAVENOUS
  Administered 2016-11-01: 5 mg/h via INTRAVENOUS
  Administered 2016-11-01: 15 mg/h via INTRAVENOUS
  Administered 2016-11-02: 10 mg/h via INTRAVENOUS
  Administered 2016-11-02: 15 mg/h via INTRAVENOUS
  Administered 2016-11-02: 7 mg/h via INTRAVENOUS
  Administered 2016-11-03: 5 mg/h via INTRAVENOUS
  Filled 2016-11-01 (×9): qty 100

## 2016-11-01 MED ORDER — ONDANSETRON HCL 4 MG/2ML IJ SOLN
INTRAMUSCULAR | Status: AC
  Filled 2016-11-01: qty 2

## 2016-11-01 MED ORDER — SODIUM CHLORIDE 0.9 % IV SOLN: 100.0000 mL | INTRAVENOUS | Status: DC | PRN

## 2016-11-01 MED ORDER — DOXYCYCLINE HYCLATE 100 MG PO TABS
100.0000 mg | ORAL_TABLET | Freq: Two times a day (BID) | ORAL | Status: DC
  Administered 2016-11-01 – 2016-11-07 (×12): 100 mg via ORAL
  Filled 2016-11-01 (×12): qty 1

## 2016-11-01 MED ORDER — ALBUTEROL SULFATE (2.5 MG/3ML) 0.083% IN NEBU
2.5000 mL | INHALATION_SOLUTION | Freq: Four times a day (QID) | RESPIRATORY_TRACT | Status: DC | PRN
  Filled 2016-11-01: qty 3

## 2016-11-01 MED ORDER — LIDOCAINE HCL (PF) 1 % IJ SOLN: 5.0000 mL | INTRAMUSCULAR | Status: DC | PRN

## 2016-11-01 MED ORDER — GUAIFENESIN ER 600 MG PO TB12
600.0000 mg | ORAL_TABLET | Freq: Two times a day (BID) | ORAL | Status: DC
  Administered 2016-11-01 – 2016-11-07 (×12): 600 mg via ORAL
  Filled 2016-11-01 (×12): qty 1

## 2016-11-01 MED ORDER — PROMETHAZINE HCL 12.5 MG PO TABS: 25.0000 mg | ORAL_TABLET | Freq: Four times a day (QID) | ORAL | Status: DC | PRN

## 2016-11-01 MED ORDER — MORPHINE SULFATE (PF) 4 MG/ML IV SOLN
4.0000 mg | Freq: Once | INTRAVENOUS | Status: AC
  Administered 2016-11-01: 4 mg via INTRAVENOUS
  Filled 2016-11-01: qty 1

## 2016-11-01 MED ORDER — PENTAFLUOROPROP-TETRAFLUOROETH EX AERO
1.0000 "application " | INHALATION_SPRAY | CUTANEOUS | Status: DC | PRN
  Filled 2016-11-01: qty 30

## 2016-11-01 MED ORDER — RENA-VITE PO TABS
1.0000 | ORAL_TABLET | Freq: Every day | ORAL | Status: DC
  Administered 2016-11-01 – 2016-11-07 (×7): 1 via ORAL
  Filled 2016-11-01 (×4): qty 1
  Filled 2016-11-01: qty 3
  Filled 2016-11-01: qty 1

## 2016-11-01 MED ORDER — HEPARIN (PORCINE) IN NACL 100-0.45 UNIT/ML-% IJ SOLN
1100.0000 [IU]/h | INTRAMUSCULAR | Status: DC
  Administered 2016-11-02 – 2016-11-03 (×2): 1000 [IU]/h via INTRAVENOUS
  Administered 2016-11-04 – 2016-11-05 (×2): 1100 [IU]/h via INTRAVENOUS
  Filled 2016-11-01 (×5): qty 250

## 2016-11-01 MED ORDER — PENTOXIFYLLINE ER 400 MG PO TBCR
400.0000 mg | EXTENDED_RELEASE_TABLET | Freq: Every day | ORAL | Status: DC
  Administered 2016-11-01 – 2016-11-07 (×6): 400 mg via ORAL
  Filled 2016-11-01 (×6): qty 1

## 2016-11-01 MED ORDER — ALPRAZOLAM 0.5 MG PO TABS
0.5000 mg | ORAL_TABLET | Freq: Three times a day (TID) | ORAL | Status: DC | PRN
  Administered 2016-11-01 – 2016-11-05 (×4): 0.5 mg via ORAL
  Filled 2016-11-01 (×4): qty 1

## 2016-11-01 MED ORDER — SEVELAMER CARBONATE 800 MG PO TABS
2400.0000 mg | ORAL_TABLET | Freq: Three times a day (TID) | ORAL | Status: DC
  Administered 2016-11-01 – 2016-11-07 (×15): 2400 mg via ORAL
  Filled 2016-11-01 (×18): qty 3

## 2016-11-01 MED ORDER — SEVELAMER CARBONATE 800 MG PO TABS: 800.0000 mg | ORAL_TABLET | Freq: Every day | ORAL | Status: DC | PRN

## 2016-11-01 MED ORDER — CINACALCET HCL 30 MG PO TABS
30.0000 mg | ORAL_TABLET | Freq: Every day | ORAL | Status: DC
Start: 2016-11-01 — End: 2016-11-07
  Administered 2016-11-01 – 2016-11-06 (×6): 30 mg via ORAL
  Filled 2016-11-01 (×8): qty 1

## 2016-11-01 MED ORDER — LIDOCAINE-PRILOCAINE 2.5-2.5 % EX CREA
1.0000 "application " | TOPICAL_CREAM | CUTANEOUS | Status: DC | PRN
  Filled 2016-11-01: qty 5

## 2016-11-01 MED ORDER — METOPROLOL TARTRATE 5 MG/5ML IV SOLN
5.0000 mg | Freq: Four times a day (QID) | INTRAVENOUS | Status: DC | PRN
  Administered 2016-11-01: 5 mg via INTRAVENOUS
  Filled 2016-11-01: qty 5

## 2016-11-01 MED ORDER — SIMVASTATIN 10 MG PO TABS
10.0000 mg | ORAL_TABLET | Freq: Every day | ORAL | Status: DC
  Administered 2016-11-01 – 2016-11-06 (×6): 10 mg via ORAL
  Filled 2016-11-01 (×6): qty 1

## 2016-11-01 MED ORDER — ONDANSETRON HCL 4 MG/2ML IJ SOLN
4.0000 mg | Freq: Once | INTRAMUSCULAR | Status: AC
  Administered 2016-11-01: 4 mg via INTRAVENOUS

## 2016-11-01 NOTE — ED Provider Notes (Signed)
[facility]-EMERGENCY DEPT Provider Note   CSN: [id number] Arrival date & time: 11/01/16  0610     History   Chief Complaint Chief Complaint  Patient presents with  . Shortness of Breath    HPI Patricia Ayala is a 83 y.o. female.  HPI  The patient is a 83 year old female with a history of chronic kidney disease with end-stage renal failure on dialysis, she had initially missed her dialysis session last Saturday and on Monday was admitted to the hospital for shortness of breath and fluid overload. She underwent dialysis on both Monday and Tuesday and was discharged home on Tuesday evening. The patient was supposed to go to dialysis again today however over the last 24 hours she has had increasing amounts of shortness of breath, coughing up green phlegm and feeling extremely dyspneic at home. She presented hypoxic in respiratory distress. The patient is unable to give much information because of increased work of breathing  Echo performed on 09/26/16 showed severe LVH, no effusion  Past Medical History:  Diagnosis Date  . Anemia 02/11/2012  . Anxiety   . Chronic kidney disease    not on dialysis yet, Tues, thurs, sat  . H/O metabolic acidosis   . Hyperparathyroidism, secondary (HCC)   . Hypertension    Dr. Regino Schultze, [place], [place]  . Vitamin D deficiency     Patient Active Problem List   Diagnosis Date Noted  . URI (upper respiratory infection) 10/30/2016  . ESRD (end stage renal disease) (HCC) 10/29/2016  . Benign essential HTN 10/29/2016  . GERD (gastroesophageal reflux disease) 10/29/2016  . HLD (hyperlipidemia) 10/29/2016  . Acute on chronic diastolic CHF (congestive heart failure) (HCC) 10/29/2016  . Pulmonary edema 09/26/2016  . Mechanical complication of other vascular device, implant, and graft 08/07/2013  . End stage renal disease (HCC) 03/27/2012  . Anemia 02/11/2012    Past Surgical History:  Procedure Laterality Date  . ABDOMINAL HYSTERECTOMY    . AV FISTULA  PLACEMENT  04/01/2012   Procedure: ARTERIOVENOUS (AV) FISTULA CREATION;  Surgeon: Sherren Kerns, MD;  Location: [facility] OR;  Service: Vascular;  Laterality: Left;  . CHOLECYSTECTOMY    . COLON SURGERY     for a blockage  . EYE SURGERY     bilateral cataracts, /w IOL - 2013  . FISTULOGRAM N/A 11/05/2014   Procedure: FISTULOGRAM;  Surgeon: Larina Earthly, MD;  Location: [facility] CATH LAB;  Service: Cardiovascular;  Laterality: N/A;  . INSERTION OF DIALYSIS CATHETER  04/10/2012   Procedure: INSERTION OF DIALYSIS CATHETER;  Surgeon: Pryor Ochoa, MD;  Location: [facility] OR;  Service: Vascular;  Laterality: N/A;  Insertion Diatek Catheter Right Internal Jugular  . LIGATION OF COMPETING BRANCHES OF ARTERIOVENOUS FISTULA  11/07/2012   Procedure: LIGATION OF COMPETING BRANCHES OF ARTERIOVENOUS FISTULA;  Surgeon: Chuck Hint, MD;  Location: [facility] OR;  Service: Vascular;  Laterality: Left;  Brachio/Cephalic Fistula  . SHUNTOGRAM N/A 11/03/2012   Procedure: Betsey Amen;  Surgeon: Chuck Hint, MD;  Location: [facility] CATH LAB;  Service: Cardiovascular;  Laterality: N/A;  . SHUNTOGRAM Left 03/09/2013   Procedure: Fistulogram;  Surgeon: Fransisco Hertz, MD;  Location: [facility] CATH LAB;  Service: Cardiovascular;  Laterality: Left;  . SHUNTOGRAM Left 11/23/2013   Procedure: FISTULOGRAM;  Surgeon: Fransisco Hertz, MD;  Location: [facility] CATH LAB;  Service: Cardiovascular;  Laterality: Left;    OB History    No data available       Home Medications    Prior  to Admission medications   Medication Sig Start Date End Date Taking? Authorizing Provider  albuterol (PROVENTIL HFA;VENTOLIN HFA) 108 (90 Base) MCG/ACT inhaler Inhale 2 puffs into the lungs every 6 (six) hours as needed for wheezing or shortness of breath. 10/30/16   Erick Blinks, MD  ALPRAZolam Prudy Feeler) 0.5 MG tablet Take 1 tablet by mouth 3 (three) times daily as needed for anxiety. 09/21/16   Historical Provider, MD  amLODipine (NORVASC) 10 MG tablet Take 1 tablet (10 mg  total) by mouth daily. 09/30/16   Meredeth Ide, MD  BIOTIN PO Take 1,000 mg by mouth daily.     Historical Provider, MD  cinacalcet (SENSIPAR) 30 MG tablet Take 30 mg by mouth at bedtime.    Historical Provider, MD  guaiFENesin (MUCINEX) 600 MG 12 hr tablet Take 1 tablet (600 mg total) by mouth 2 (two) times daily. 10/30/16   Erick Blinks, MD  labetalol (NORMODYNE) 200 MG tablet Take 200 mg by mouth 2 (two) times daily.     Historical Provider, MD  lidocaine-prilocaine (EMLA) cream Apply 1 application topically as needed (for IV access).  01/12/13   Historical Provider, MD  multivitamin (RENA-VIT) TABS tablet Take 1 tablet by mouth daily.    Historical Provider, MD  pantoprazole (PROTONIX) 40 MG tablet Take 1 tablet by mouth daily. 09/03/16   Historical Provider, MD  pentoxifylline (TRENTAL) 400 MG CR tablet Take 400 mg by mouth 3 (three) times daily with meals.    Historical Provider, MD  promethazine (PHENERGAN) 25 MG tablet Take 25 mg by mouth every 6 (six) hours as needed for nausea or vomiting.    Historical Provider, MD  sevelamer carbonate (RENVELA) 800 MG tablet Take 2,400 mg by mouth 3 (three) times daily with meals. Take 800mg  with snacks daily.    Historical Provider, MD  simvastatin (ZOCOR) 10 MG tablet Take 10 mg by mouth at bedtime.  02/06/13   Historical Provider, MD    Family History Family History  Problem Relation Age of Onset  . Stroke Mother   . Cancer Father   . Heart attack Father     Social History Social History  Substance Use Topics  . Smoking status: Never Smoker  . Smokeless tobacco: Never Used  . Alcohol use No     Allergies   Patient has no known allergies.   Review of Systems Review of Systems  All other systems reviewed and are negative.    Physical Exam Updated Vital Signs BP 118/76 (BP Location: Right Arm)   Pulse (!) 134   Temp 98.4 F (36.9 C) (Oral)   Resp 26   Wt 172 lb (78 kg)   SpO2 94%   BMI 29.52 kg/m   Physical Exam    Constitutional:  Increased work of breathing, mild distress  HENT:  Normocephalic and atraumatic, mucous membranes appear slightly dehydrated  Eyes:  Conjunctivae are clear, pupils are equal round and reactive to light  Neck:  Overweight, no obvious JVD seen  Cardiovascular:  Atrial fibrillation with a rapid rate is present, dialysis access patent  Pulmonary/Chest:  Rales bilateral lower lobes, tachypnea, mild increased work of breathing, speaks in shortened sentences  Abdominal:  Soft nontender abdomen  Musculoskeletal: Normal range of motion. She exhibits no edema or deformity.  Neurological: She is alert. She exhibits normal muscle tone. Coordination normal.  Skin: Skin is warm and dry. No rash noted.     ED Treatments / Results  Labs (all labs ordered are listed,  but only abnormal results are displayed) Labs Reviewed  CULTURE, BLOOD (ROUTINE X 2)  CULTURE, BLOOD (ROUTINE X 2)  URINE CULTURE  COMPREHENSIVE METABOLIC PANEL  CBC WITH DIFFERENTIAL/PLATELET  URINALYSIS, ROUTINE W REFLEX MICROSCOPIC  BRAIN NATRIURETIC PEPTIDE  TROPONIN I  I-STAT CG4 LACTIC ACID, ED    EKG  EKG Interpretation  Date/Time:  Thursday November 01 2016 06:19:01 EST Ventricular Rate:  125 PR Interval:    QRS Duration: 90 QT Interval:  340 QTC Calculation: 491 R Axis:   3 Text Interpretation:  Atrial fibrillation Ventricular premature complex Probable LVH with secondary repol abnrm Borderline prolonged QT interval new onset afib with RVR Confirmed by Hyacinth Meeker  MD, Yamen Castrogiovanni ([id number]) on 11/01/2016 6:37:10 AM       Radiology Dg Chest Port 1 View  Result Date: 11/01/2016 CLINICAL DATA:  Dyspnea this morning EXAM: PORTABLE CHEST 1 VIEW COMPARISON:  10/29/2016 FINDINGS: Marked cardiomegaly, unchanged. Mild vascular prominence. The vascular and interstitial pattern is improved from 10/29/2016. No airspace consolidation or significant alveolar edema. IMPRESSION: Improved from 10/29/2016.  Marked  cardiomegaly persists. Electronically Signed   By: Ellery Plunk M.D.   On: 11/01/2016 06:56    Procedures Procedures (including critical care time)  Medications Ordered in ED Medications  diltiazem (CARDIZEM) 1 mg/mL load via infusion 20 mg (20 mg Intravenous Bolus from Bag 11/01/16 0709)    And  diltiazem (CARDIZEM) 100 mg in dextrose 5 % 100 mL (1 mg/mL) infusion (5 mg/hr Intravenous New Bag/Given 11/01/16 0708)     Initial Impression / Assessment and Plan / ED Course  I have reviewed the triage vital signs and the nursing notes.  Pertinent labs & imaging results that were available during my care of the patient were reviewed by me and considered in my medical decision making (see chart for details).  Clinical Course     Echo reviewed Labs pending Xray shows improved aeration, persistent cardiomegaly - no infiltrates cardizem bolus and drip for ongoing tachycardia - pt will need admission.  CRITICAL CARE Performed by: Vida Roller Total critical care time: 35 minutes Critical care time was exclusive of separately billable procedures and treating other patients. Critical care was necessary to treat or prevent imminent or life-threatening deterioration. Critical care was time spent personally by me on the following activities: development of treatment plan with patient and/or surrogate as well as nursing, discussions with consultants, evaluation of patient's response to treatment, examination of patient, obtaining history from patient or surrogate, ordering and performing treatments and interventions, ordering and review of laboratory studies, ordering and review of radiographic studies, pulse oximetry and re-evaluation of patient's condition.  Change of shift - care signed out to Dr. Particia Nearing - pending labs to d/w hospitalist for admistion.  Final Clinical Impressions(s) / ED Diagnoses   Final diagnoses:  Atrial fibrillation with rapid ventricular response [facility])    New  Prescriptions New Prescriptions   No medications on file     Eber Hong, MD 11/01/16 [phone]

## 2016-11-01 NOTE — ED Provider Notes (Signed)
Pt signed out by Dr. Hyacinth Meeker.  She presented to the ED with SOB and has a hx of ESRD on HD with a recent admission with fluid overload.  She was due for dialysis today.  Pt was found to be in new onset a.fib with RVR (CHADVASC score 5).  Pt was given a cardizem bolus and placed on a cardizem drip.  HR has improved, but she is still in a.fib.  She was given a lovenox dose.  Pt d/w Dr. Blake Divine ([facility]) who will admit.   Jacalyn Lefevre, MD 11/01/16 [phone]

## 2016-11-01 NOTE — Consult Note (Signed)
Reason for Consult: End-stage renal disease Referring Physician: Dr. Hosie Poisson  Patricia Ayala is an 83 y.o. female.  HPI: She is a patient with history of hypertension, cardiomegaly, end-stage renal disease on maintenance hemodialysis presently came with complaints of cough, sputum production, and difficulty in breathing the last 24 hours. Patient also stated that she is producing some sputum. When she was evaluated in emergency room shows found to have atrial fibrillation with a controlled heart rate is admitted to the hospital for further management. Presently patient denies any nausea or vomiting. She complains of some difficulty in breathing but has improved since she came to the emergency room. Patient does not have any chest pain.  Past Medical History:  Diagnosis Date  . Anemia 02/11/2012  . Anxiety   . Chronic kidney disease    not on dialysis yet, Tues, thurs, sat  . H/O metabolic acidosis   . Hyperparathyroidism, secondary ([place])   . Hypertension    Dr. Orson Ape, Linna Hoff, [place]  . Vitamin D deficiency     Past Surgical History:  Procedure Laterality Date  . ABDOMINAL HYSTERECTOMY    . AV FISTULA PLACEMENT  04/01/2012   Procedure: ARTERIOVENOUS (AV) FISTULA CREATION;  Surgeon: Elam Dutch, MD;  Location: [place];  Service: Vascular;  Laterality: Left;  . CHOLECYSTECTOMY    . COLON SURGERY     for a blockage  . EYE SURGERY     bilateral cataracts, /w IOL - 2013  . FISTULOGRAM N/A 11/05/2014   Procedure: FISTULOGRAM;  Surgeon: Rosetta Posner, MD;  Location: [facility] CATH LAB;  Service: Cardiovascular;  Laterality: N/A;  . INSERTION OF DIALYSIS CATHETER  04/10/2012   Procedure: INSERTION OF DIALYSIS CATHETER;  Surgeon: Mal Misty, MD;  Location: [place];  Service: Vascular;  Laterality: N/A;  Insertion Diatek Catheter Right Internal Jugular  . LIGATION OF COMPETING BRANCHES OF ARTERIOVENOUS FISTULA  11/07/2012   Procedure: LIGATION OF COMPETING BRANCHES OF ARTERIOVENOUS FISTULA;   Surgeon: Angelia Mould, MD;  Location: [place];  Service: Vascular;  Laterality: Left;  Brachio/Cephalic Fistula  . SHUNTOGRAM N/A 11/03/2012   Procedure: Earney Mallet;  Surgeon: Angelia Mould, MD;  Location: [facility] CATH LAB;  Service: Cardiovascular;  Laterality: N/A;  . SHUNTOGRAM Left 03/09/2013   Procedure: Fistulogram;  Surgeon: Conrad Corinth, MD;  Location: [facility] CATH LAB;  Service: Cardiovascular;  Laterality: Left;  . SHUNTOGRAM Left 11/23/2013   Procedure: FISTULOGRAM;  Surgeon: Conrad [place], MD;  Location: [facility] CATH LAB;  Service: Cardiovascular;  Laterality: Left;    Family History  Problem Relation Age of Onset  . Stroke Mother   . Cancer Father   . Heart attack Father     Social History:  reports that she has never smoked. She has never used smokeless tobacco. She reports that she does not drink alcohol or use drugs.  Allergies: No Known Allergies  Medications: I have reviewed the patient's current medications.  Results for orders placed or performed during the hospital encounter of 11/01/16 (from the past 48 hour(s))  Comprehensive metabolic panel     Status: Abnormal   Collection Time: 11/01/16  6:55 AM  Result Value Ref Range   Sodium 133 (L) 135 - 145 mmol/L   Potassium 4.2 3.5 - 5.1 mmol/L   Chloride 93 (L) 101 - 111 mmol/L   CO2 26 22 - 32 mmol/L   Glucose, Bld 137 (H) 65 - 99 mg/dL   BUN 55 (H) 6 - 20 mg/dL  Creatinine, Ser 7.72 (H) 0.44 - 1.00 mg/dL   Calcium 9.2 8.9 - 10.3 mg/dL   Total Protein 7.0 6.5 - 8.1 g/dL   Albumin 3.3 (L) 3.5 - 5.0 g/dL   AST 28 15 - 41 U/L   ALT 33 14 - 54 U/L   Alkaline Phosphatase 66 38 - 126 U/L   Total Bilirubin 1.3 (H) 0.3 - 1.2 mg/dL   GFR calc non Af Amer 4 (L) >60 mL/min   GFR calc Af Amer 5 (L) >60 mL/min    Comment: (NOTE) The eGFR has been calculated using the CKD EPI equation. This calculation has not been validated in all clinical situations. eGFR's persistently <60 mL/min signify possible Chronic  Kidney Disease.    Anion gap 14 5 - 15  CBC WITH DIFFERENTIAL     Status: Abnormal   Collection Time: 11/01/16  6:55 AM  Result Value Ref Range   WBC 12.6 (H) 4.0 - 10.5 K/uL   RBC 3.52 (L) 3.87 - 5.11 MIL/uL   Hemoglobin 10.6 (L) 12.0 - 15.0 g/dL   HCT 33.8 (L) 36.0 - 46.0 %   MCV 96.0 78.0 - 100.0 fL   MCH 30.1 26.0 - 34.0 pg   MCHC 31.4 30.0 - 36.0 g/dL   RDW 14.0 11.5 - 15.5 %   Platelets 216 150 - 400 K/uL   Neutrophils Relative % 83 %   Neutro Abs 10.5 (H) 1.7 - 7.7 K/uL   Lymphocytes Relative 4 %   Lymphs Abs 0.5 (L) 0.7 - 4.0 K/uL   Monocytes Relative 12 %   Monocytes Absolute 1.6 (H) 0.1 - 1.0 K/uL   Eosinophils Relative 1 %   Eosinophils Absolute 0.1 0.0 - 0.7 K/uL   Basophils Relative 0 %   Basophils Absolute 0.0 0.0 - 0.1 K/uL  Blood Culture (routine x 2)     Status: None (Preliminary result)   Collection Time: 11/01/16  6:55 AM  Result Value Ref Range   Specimen Description BLOOD RIGHT HAND    Special Requests BOTTLES DRAWN AEROBIC AND ANAEROBIC 6CC    Culture NO GROWTH <12 HOURS    Report Status PENDING   Brain natriuretic peptide     Status: Abnormal   Collection Time: 11/01/16  6:55 AM  Result Value Ref Range   B Natriuretic Peptide 1,962.0 (H) 0.0 - 100.0 pg/mL  Troponin I     Status: Abnormal   Collection Time: 11/01/16  6:55 AM  Result Value Ref Range   Troponin I 0.07 (HH) <0.03 ng/mL    Comment: CRITICAL RESULT CALLED TO, READ BACK BY AND VERIFIED WITH: SHORE,L AT 7:30AM ON 11/01/16 BY FESTERMAN,C   I-Stat CG4 Lactic Acid, ED  (not at  [facility])     Status: None   Collection Time: 11/01/16  6:59 AM  Result Value Ref Range   Lactic Acid, Venous 1.50 0.5 - 1.9 mmol/L  Blood Culture (routine x 2)     Status: None (Preliminary result)   Collection Time: 11/01/16  7:01 AM  Result Value Ref Range   Specimen Description BLOOD RIGHT HAND    Special Requests BOTTLES DRAWN AEROBIC ONLY 4CC    Culture NO GROWTH <12 HOURS    Report Status PENDING     Dg  Chest Port 1 View  Result Date: 11/01/2016 CLINICAL DATA:  Dyspnea this morning EXAM: PORTABLE CHEST 1 VIEW COMPARISON:  10/29/2016 FINDINGS: Marked cardiomegaly, unchanged. Mild vascular prominence. The vascular and interstitial pattern is improved from 10/29/2016.  No airspace consolidation or significant alveolar edema. IMPRESSION: Improved from 10/29/2016.  Marked cardiomegaly persists. Electronically Signed   By: Andreas Newport M.D.   On: 11/01/2016 06:56    Review of Systems  Constitutional: Positive for malaise/fatigue. Negative for chills and fever.  HENT: Positive for congestion.   Respiratory: Positive for cough, sputum production and shortness of breath.   Cardiovascular: Negative for chest pain and orthopnea.  Gastrointestinal: Negative for nausea and vomiting.  Musculoskeletal: Positive for back pain.   Blood pressure 99/56, pulse 106, temperature 98.4 F (36.9 C), temperature source Oral, resp. rate 17, weight 78 kg (172 lb), SpO2 (!) 89 %. Physical Exam  Constitutional: She is oriented to person, place, and time. No distress.  Neck: No JVD present.  Cardiovascular:  No murmur heard. Irregular rate and rhythm  Respiratory: She has no wheezes. She has no rales.  GI: She exhibits no distension. There is no tenderness.  Musculoskeletal: She exhibits no edema.  Neurological: She is alert and oriented to person, place, and time.    Assessment/Plan: Problem #1 end-stage renal disease: Patient is due for dialysis today. Her potassium is normal and she doesn't have any nausea or vomiting. Problem #2 Atrail fibrillation: Presently she is on Cardizem drip and her heart rate has come down Problem #3 hypertension: Her blood pressure is reasonably controlled Problem #4 fluid management: Presently patient with difficulty breathing. Chest x-ray cardiomegaly. The patient has missed her dialysis however was dialyzed 2 days consecutively. Problem #5 anemia: Her hemoglobin is within our  target goal Problem #6 metabolic bone disease her calcium is a range. Problem #7 history of cough with sputum production: Possibly upper respiratory tract infection. Presently she is a febrile and her white blood cell count is normal. Plan: 1] We'll make arrangements for patient to get dialysis today once she is admitted and goes to the floor 2] We'll dialyze her for 4 hours and try to get about 3 days if systolic blood pressure remains above 90. 3] We'll check her renal panel and CBC in the morning.  Suzane Vanderweide S 11/01/2016, 9:36 AM

## 2016-11-01 NOTE — ED Notes (Signed)
CRITICAL VALUE ALERT  Critical value received:  Troponin - 0.07  Date of notification:  11/01/2016  Time of notification:  0734 Critical value read back: yes  Nurse who received alert:  LJS  MD notified (1st page):  Dr Particia Nearing  Time of first page:    MD notified (2nd page):  Time of second page:  Responding MD:  Dr Particia Nearing  Time MD responded:  [phone]

## 2016-11-01 NOTE — Progress Notes (Addendum)
ANTICOAGULATION CONSULT NOTE - Initial Consult  Pharmacy Consult for Heparin with no bolus Indication: atrial fibrillation  No Known Allergies  Patient Measurements: Height: 5\' 4"  (162.6 cm) Weight: 164 lb 0.4 oz (74.4 kg) IBW/kg (Calculated) : 54.7 HEPARIN DW (KG): 70.2  Vital Signs: Temp: 98.2 F (36.8 C) (01/04 1310) Temp Source: Oral (01/04 1310) BP: 111/58 (01/04 1330) Pulse Rate: 106 (01/04 1330)  Labs:  Recent Labs  10/29/16 1923 10/30/16 0604 11/01/16 0655 11/01/16 1110  HGB 10.3* 10.8* 10.6*  --   HCT 32.9* 34.0* 33.8*  --   PLT 221 223 216  --   CREATININE 5.23* 6.49* 7.72*  --   TROPONINI  --   --  0.07* 0.06*    Estimated Creatinine Clearance: 5.3 mL/min (by C-G formula based on SCr of 7.72 mg/dL (H)).   Medical History: Past Medical History:  Diagnosis Date  . Anemia 02/11/2012  . Anxiety   . Chronic kidney disease    not on dialysis yet, Tues, thurs, sat  . H/O metabolic acidosis   . Hyperparathyroidism, secondary ([facility])   . Hypertension    Dr. Regino Schultze, [place], [place]  . Vitamin D deficiency     Medications:  Prescriptions Prior to Admission  Medication Sig Dispense Refill Last Dose  . albuterol (PROVENTIL HFA;VENTOLIN HFA) 108 (90 Base) MCG/ACT inhaler Inhale 2 puffs into the lungs every 6 (six) hours as needed for wheezing or shortness of breath. 1 Inhaler 2 unknown  . ALPRAZolam (XANAX) 0.5 MG tablet Take 1 tablet by mouth 3 (three) times daily as needed for anxiety.   Past Week at Unknown time  . amLODipine (NORVASC) 10 MG tablet Take 1 tablet (10 mg total) by mouth daily. 30 tablet 2 11/01/2016 at Unknown time  . BIOTIN PO Take 1,000 mg by mouth daily.    10/31/2016 at Unknown time  . cinacalcet (SENSIPAR) 30 MG tablet Take 30 mg by mouth at bedtime.   10/31/2016 at Unknown time  . guaiFENesin (MUCINEX) 600 MG 12 hr tablet Take 1 tablet (600 mg total) by mouth 2 (two) times daily. 30 tablet 0 10/31/2016 at Unknown time  . labetalol (NORMODYNE) 200  MG tablet Take 200 mg by mouth 2 (two) times daily.    10/31/2016 at Unknown time  . lidocaine-prilocaine (EMLA) cream Apply 1 application topically as needed (for IV access).    unknown  . multivitamin (RENA-VIT) TABS tablet Take 1 tablet by mouth daily.   10/31/2016 at Unknown time  . pantoprazole (PROTONIX) 40 MG tablet Take 1 tablet by mouth daily.   10/31/2016 at Unknown time  . pentoxifylline (TRENTAL) 400 MG CR tablet Take 400 mg by mouth 3 (three) times daily with meals.   10/31/2016 at Unknown time  . promethazine (PHENERGAN) 25 MG tablet Take 25 mg by mouth every 6 (six) hours as needed for nausea or vomiting.   unknown  . sevelamer carbonate (RENVELA) 800 MG tablet Take 2,400 mg by mouth 3 (three) times daily with meals. Take 800mg  with snacks daily.   10/31/2016 at Unknown time  . simvastatin (ZOCOR) 10 MG tablet Take 10 mg by mouth at bedtime.    10/31/2016 at Unknown time    Assessment: 83 y.o. female with medical history significant of ESRD on HD TTS, anemia, hypertension, recently discharged from the hospital on 1/2 after being treated for fluid overload. On arrival to ED, she was found to be in Afib with RVR, new onset. Was given lovenox 1mg /kg this AM and  plan to continue anticoagulation with Heparin(no bolus per MD).  Goal of Therapy:  Heparin level 0.3-0.7 units/ml Monitor platelets by anticoagulation protocol: Yes   Plan:  Start heparin infusion at 1000 units/hr to start 24 hours after lovenox administration this morning at 0800 Check anti-Xa level in 8 hours and daily while on heparin Continue to monitor H&H and platelets  Elder Cyphers, BS Loura Back, BCPS Clinical Pharmacist Pager [phone] 11/01/2016,1:39 PM

## 2016-11-01 NOTE — ED Notes (Signed)
Patient's O2 saturation drops to 85% while patient is sleeping on 3L via nasal canula. Upon wakening patient, O2 saturation increases to 94%. Increased oxygen to 4L at this time.

## 2016-11-01 NOTE — Progress Notes (Signed)
Pt's HR ranging from 110-140's while on dialysis. Maxed out on Cardizem gtt at 15mg /hr. Paged Md. Order of metoprolol 5mg  Q6 PRN given.  Before dialysis pt's HR ranged 80's-115.  Will continue to monitor patient.

## 2016-11-01 NOTE — ED Triage Notes (Signed)
Spouse states that she is supposed to have dialysis this morning.  States that she has dialysis on Tuesday, Thursday, and Saturday.  States that she had dialysis on Tuesday prior to discharge.

## 2016-11-01 NOTE — Procedures (Signed)
   HEMODIALYSIS TREATMENT NOTE:  Pre-dialysis HR 80-115, Afib. 4 hour HD session ordered with goal of 3 liters.  2 hours and 15 minutes into HD session HR increased to 110-145.  BP stable, pt asymptomatic. UF rate and blood flow rate were decreased while Dr. Kristian Covey was paged.  Order was received to terminate dialysis today, nephrologist to follow up tomorrow.  Primary RN to administer Metoprolol bolus.   Total HD time: 2 hours, 30 minutes Net UF: 2033cc  Arman Filter, RN, CDN

## 2016-11-01 NOTE — H&P (Signed)
History and Physical    Patricia Ayala:[id number] DOB: Feb 05, 1929 DOA: 11/01/2016  PCP: [name]., MD   Patient coming from: Home.   Chief Complaint: sob.  HPI: Patricia Ayala is a 84 y.o. female with medical history significant of ESRD on HD TTS, anemia, hypertension, recently discharged from the hospital on 1/2 after being treated for fluid overload from missing HD, presents to day with sob, associated with cough, myalgias, hypoxic on arrival. She denies any chest pain, nausea, vomiting or palpitations, syncope. On arrival to ED, she was found to be in Afib with RVR, new onset . She was started on cardizem drip and was given a dose of lovenox for anticoagulation. She was referred to medical service for admission.  She reports some orthopnea, but no PND. She requires about 4 lit of [place] oxygen to keep sats greater than 90%.      Review of Systems: As per HPI otherwise 10 point review of systems negative.    Past Medical History:  Diagnosis Date  . Anemia 02/11/2012  . Anxiety   . Chronic kidney disease    not on dialysis yet, Tues, thurs, sat  . H/O metabolic acidosis   . Hyperparathyroidism, secondary (HCC)   . Hypertension    Dr. Regino Schultze, [facility], [place]  . Vitamin D deficiency     Past Surgical History:  Procedure Laterality Date  . ABDOMINAL HYSTERECTOMY    . AV FISTULA PLACEMENT  04/01/2012   Procedure: ARTERIOVENOUS (AV) FISTULA CREATION;  Surgeon: Sherren Kerns, MD;  Location: [facility] OR;  Service: Vascular;  Laterality: Left;  . CHOLECYSTECTOMY    . COLON SURGERY     for a blockage  . EYE SURGERY     bilateral cataracts, /w IOL - 2013  . FISTULOGRAM N/A 11/05/2014   Procedure: FISTULOGRAM;  Surgeon: Larina Earthly, MD;  Location: [facility] CATH LAB;  Service: Cardiovascular;  Laterality: N/A;  . INSERTION OF DIALYSIS CATHETER  04/10/2012   Procedure: INSERTION OF DIALYSIS CATHETER;  Surgeon: Pryor Ochoa, MD;  Location: [facility] OR;  Service: Vascular;  Laterality: N/A;   Insertion Diatek Catheter Right Internal Jugular  . LIGATION OF COMPETING BRANCHES OF ARTERIOVENOUS FISTULA  11/07/2012   Procedure: LIGATION OF COMPETING BRANCHES OF ARTERIOVENOUS FISTULA;  Surgeon: Chuck Hint, MD;  Location: [facility] OR;  Service: Vascular;  Laterality: Left;  Brachio/Cephalic Fistula  . SHUNTOGRAM N/A 11/03/2012   Procedure: Betsey Amen;  Surgeon: Chuck Hint, MD;  Location: [facility] CATH LAB;  Service: Cardiovascular;  Laterality: N/A;  . SHUNTOGRAM Left 03/09/2013   Procedure: Fistulogram;  Surgeon: Fransisco Hertz, MD;  Location: [facility] CATH LAB;  Service: Cardiovascular;  Laterality: Left;  . SHUNTOGRAM Left 11/23/2013   Procedure: FISTULOGRAM;  Surgeon: Fransisco Hertz, MD;  Location: [facility] CATH LAB;  Service: Cardiovascular;  Laterality: Left;     reports that she has never smoked. She has never used smokeless tobacco. She reports that she does not drink alcohol or use drugs.  No Known Allergies  Family History  Problem Relation Age of Onset  . Stroke Mother   . Cancer Father   . Heart attack Father    Reviewed.   Prior to Admission medications   Medication Sig Start Date End Date Taking? Authorizing Provider  albuterol (PROVENTIL HFA;VENTOLIN HFA) 108 (90 Base) MCG/ACT inhaler Inhale 2 puffs into the lungs every 6 (six) hours as needed for wheezing or shortness of breath. 10/30/16   Erick Blinks, MD  ALPRAZolam Prudy Feeler)  0.5 MG tablet Take 1 tablet by mouth 3 (three) times daily as needed for anxiety. 09/21/16   Historical Provider, MD  amLODipine (NORVASC) 10 MG tablet Take 1 tablet (10 mg total) by mouth daily. 09/30/16   Meredeth Ide, MD  BIOTIN PO Take 1,000 mg by mouth daily.     Historical Provider, MD  cinacalcet (SENSIPAR) 30 MG tablet Take 30 mg by mouth at bedtime.    Historical Provider, MD  guaiFENesin (MUCINEX) 600 MG 12 hr tablet Take 1 tablet (600 mg total) by mouth 2 (two) times daily. 10/30/16   Erick Blinks, MD  labetalol (NORMODYNE) 200 MG tablet Take  200 mg by mouth 2 (two) times daily.     Historical Provider, MD  lidocaine-prilocaine (EMLA) cream Apply 1 application topically as needed (for IV access).  01/12/13   Historical Provider, MD  multivitamin (RENA-VIT) TABS tablet Take 1 tablet by mouth daily.    Historical Provider, MD  pantoprazole (PROTONIX) 40 MG tablet Take 1 tablet by mouth daily. 09/03/16   Historical Provider, MD  pentoxifylline (TRENTAL) 400 MG CR tablet Take 400 mg by mouth 3 (three) times daily with meals.    Historical Provider, MD  promethazine (PHENERGAN) 25 MG tablet Take 25 mg by mouth every 6 (six) hours as needed for nausea or vomiting.    Historical Provider, MD  sevelamer carbonate (RENVELA) 800 MG tablet Take 2,400 mg by mouth 3 (three) times daily with meals. Take 800mg  with snacks daily.    Historical Provider, MD  simvastatin (ZOCOR) 10 MG tablet Take 10 mg by mouth at bedtime.  02/06/13   Historical Provider, MD    Physical Exam: Vitals:   11/01/16 0900 11/01/16 0915 11/01/16 0930 11/01/16 1010  BP: 112/63 99/56 110/64   Pulse: 97 106 71 91  Resp: 19 17 19  (!) 21  Temp:    98.1 F (36.7 C)  TempSrc:    Oral  SpO2: 90% (!) 89% 93% (!) 86%  Weight:    74.4 kg (164 lb 0.4 oz)  Height:    5\' 4"  (1.626 m)      Constitutional: NAD, calm, comfortable on 4 to 5 liters of [place] oxygen.  Vitals:   11/01/16 0900 11/01/16 0915 11/01/16 0930 11/01/16 1010  BP: 112/63 99/56 110/64   Pulse: 97 106 71 91  Resp: 19 17 19  (!) 21  Temp:    98.1 F (36.7 C)  TempSrc:    Oral  SpO2: 90% (!) 89% 93% (!) 86%  Weight:    74.4 kg (164 lb 0.4 oz)  Height:    5\' 4"  (1.626 m)   Eyes: PERRL, lids and conjunctivae normal ENMT: Mucous membranes are moist. Posterior pharynx clear of any exudate or lesions.Normal dentition.  Neck: normal, supple, no masses, no thyromegaly Respiratory: clear to auscultation bilaterally, no wheezing, no crackles. Normal respiratory effort. No accessory muscle use.  Cardiovascular: Regular  rate and rhythm, no murmurs / rubs / gallops. No extremity edema. 2+ pedal pulses. No carotid bruits.  Abdomen: no tenderness, no masses palpated. No hepatosplenomegaly. Bowel sounds positive.  Musculoskeletal: no clubbing / cyanosis. No joint deformity upper and lower extremities. Good ROM, no contractures. Normal muscle tone.  Skin: no rashes, lesions, ulcers. No induration Neurologic: CN 2-12 grossly intact. Sensation intact, DTR normal. Strength 5/5 in all 4.  Psychiatric: Normal judgment and insight. Alert and oriented x 3. Normal mood.     Labs on Admission: I have personally reviewed following labs  and imaging studies  CBC:  Recent Labs Lab 10/29/16 0740 10/29/16 1923 10/30/16 0604 11/01/16 0655  WBC 12.8* 12.7* 11.4* 12.6*  NEUTROABS 10.1*  --   --  10.5*  HGB 10.6* 10.3* 10.8* 10.6*  HCT 33.8* 32.9* 34.0* 33.8*  MCV 97.4 97.3 96.6 96.0  PLT 247 221 223 216   Basic Metabolic Panel:  Recent Labs Lab 10/29/16 0740 10/29/16 1923 10/30/16 0604 11/01/16 0655  NA 141  --  137 133*  K 4.4  --  4.0 4.2  CL 105  --  98* 93*  CO2 27  --  27 26  GLUCOSE 126*  --  122* 137*  BUN 57*  --  35* 55*  CREATININE 9.92* 5.23* 6.49* 7.72*  CALCIUM 9.3  --  8.9 9.2  PHOS 5.6*  --   --   --    GFR: Estimated Creatinine Clearance: 5.3 mL/min (by C-G formula based on SCr of 7.72 mg/dL (H)). Liver Function Tests:  Recent Labs Lab 10/29/16 0740 11/01/16 0655  AST  --  28  ALT  --  33  ALKPHOS  --  66  BILITOT  --  1.3*  PROT  --  7.0  ALBUMIN 3.7 3.3*   No results for input(s): LIPASE, AMYLASE in the last 168 hours. No results for input(s): AMMONIA in the last 168 hours. Coagulation Profile: No results for input(s): INR, PROTIME in the last 168 hours. Cardiac Enzymes:  Recent Labs Lab 10/29/16 0740 11/01/16 0655  TROPONINI 0.05* 0.07*   BNP (last 3 results) No results for input(s): PROBNP in the last 8760 hours. HbA1C: No results for input(s): HGBA1C in the  last 72 hours. CBG: No results for input(s): GLUCAP in the last 168 hours. Lipid Profile: No results for input(s): CHOL, HDL, LDLCALC, TRIG, CHOLHDL, LDLDIRECT in the last 72 hours. Thyroid Function Tests: No results for input(s): TSH, T4TOTAL, FREET4, T3FREE, THYROIDAB in the last 72 hours. Anemia Panel: No results for input(s): VITAMINB12, FOLATE, FERRITIN, TIBC, IRON, RETICCTPCT in the last 72 hours. Urine analysis:    Component Value Date/Time   COLORURINE YELLOW 09/24/2015 1341   APPEARANCEUR CLEAR 09/24/2015 1341   LABSPEC 1.010 09/24/2015 1341   PHURINE 8.0 09/24/2015 1341   GLUCOSEU 100 (A) 09/24/2015 1341   HGBUR MODERATE (A) 09/24/2015 1341   BILIRUBINUR NEGATIVE 09/24/2015 1341   KETONESUR NEGATIVE 09/24/2015 1341   PROTEINUR 100 (A) 09/24/2015 1341   NITRITE NEGATIVE 09/24/2015 1341   LEUKOCYTESUR NEGATIVE 09/24/2015 1341   Sepsis Labs: !!!!!!!!!!!!!!!!!!!!!!!!!!!!!!!!!!!!!!!!!!!! @LABRCNTIP (procalcitonin:4,lacticidven:4) ) Recent Results (from the past 240 hour(s))  MRSA PCR Screening     Status: None   Collection Time: 10/29/16 12:28 PM  Result Value Ref Range Status   MRSA by PCR NEGATIVE NEGATIVE Final    Comment:        The GeneXpert MRSA Assay (FDA approved for NASAL specimens only), is one component of a comprehensive MRSA colonization surveillance program. It is not intended to diagnose MRSA infection nor to guide or monitor treatment for MRSA infections.   Blood Culture (routine x 2)     Status: None (Preliminary result)   Collection Time: 11/01/16  6:55 AM  Result Value Ref Range Status   Specimen Description BLOOD RIGHT HAND  Final   Special Requests BOTTLES DRAWN AEROBIC AND ANAEROBIC 6CC  Final   Culture NO GROWTH <12 HOURS  Final   Report Status PENDING  Incomplete  Blood Culture (routine x 2)     Status: None (Preliminary  result)   Collection Time: 11/01/16  7:01 AM  Result Value Ref Range Status   Specimen Description BLOOD RIGHT HAND   Final   Special Requests BOTTLES DRAWN AEROBIC ONLY 4CC  Final   Culture NO GROWTH <12 HOURS  Final   Report Status PENDING  Incomplete     Radiological Exams on Admission: Dg Chest Port 1 View  Result Date: 11/01/2016 CLINICAL DATA:  Dyspnea this morning EXAM: PORTABLE CHEST 1 VIEW COMPARISON:  10/29/2016 FINDINGS: Marked cardiomegaly, unchanged. Mild vascular prominence. The vascular and interstitial pattern is improved from 10/29/2016. No airspace consolidation or significant alveolar edema. IMPRESSION: Improved from 10/29/2016.  Marked cardiomegaly persists. Electronically Signed   By: Ellery Plunk M.D.   On: 11/01/2016 06:56    EKG: Independently reviewed. afib with RVR with prolonged qt interval.   Assessment/Plan Active Problems:   Atrial fibrillation with RVR (HCC)   Acute respiratory failure (HCC)   Acute respiratory failure with hypoxia suspect from fluid overload,. BNP elevated at 1960.  CXR shows improvement of interstitial edema when compared to CXR from 1/1.  Productive sputum, ? Bronchitis. Add doxycycline.  No air space consolidation or infiltrate on CXR.  Get Echocardiogram for further evaluation.  [place] oxygen as needed.  Lactic acid is normal.    Atrial fibrillation with RVR: Rate better controlled after starting cardizem gtt.  ECHO, anti coagulation, and serial troponins.  Cardiology consulted for recommendations.  Repeat EKG follow up.     ESRD on HD:  Nephrology consulted for HD today. Potassium level within normal limits.   Hypertension: well controlled.     DVT prophylaxis: heparin.  Code Status: full code.  Family Communication: family at bedside.  Disposition Plan: pending further eval.  Consults called: cardiology and nephrology.  Admission status: inpatient sdu.    Kathlen Mody MD [facility] Pager [phone]   If 7PM-7AM, please contact night-coverage [email or web address] Password [email or web address]  11/01/2016, 10:36 AM

## 2016-11-01 NOTE — ED Triage Notes (Signed)
Patient states that she was in the hospital for two days and discharged on the 2nd.  Spouse states that she is having pain in her back, shortness of breath, and a real bad cough.

## 2016-11-02 ENCOUNTER — Inpatient Hospital Stay (HOSPITAL_COMMUNITY): Payer: Medicare Other

## 2016-11-02 DIAGNOSIS — N186 End stage renal disease: Secondary | ICD-10-CM

## 2016-11-02 DIAGNOSIS — R748 Abnormal levels of other serum enzymes: Secondary | ICD-10-CM

## 2016-11-02 DIAGNOSIS — I4891 Unspecified atrial fibrillation: Principal | ICD-10-CM

## 2016-11-02 DIAGNOSIS — I10 Essential (primary) hypertension: Secondary | ICD-10-CM

## 2016-11-02 LAB — TROPONIN I
TROPONIN I: 0.05 ng/mL — AB (ref <0.03)
TROPONIN I: 0.06 ng/mL — AB (ref <0.03)

## 2016-11-02 LAB — RENAL FUNCTION PANEL
ALBUMIN: 3 g/dL — AB (ref 3.5–5.0)
ANION GAP: 10 (ref 5–15)
BUN: 39 mg/dL — ABNORMAL HIGH (ref 6–20)
CO2: 29 mmol/L (ref 22–32)
Calcium: 8.2 mg/dL — ABNORMAL LOW (ref 8.9–10.3)
Chloride: 93 mmol/L — ABNORMAL LOW (ref 101–111)
Creatinine, Ser: 6.06 mg/dL — ABNORMAL HIGH (ref 0.44–1.00)
GFR calc Af Amer: 7 mL/min — ABNORMAL LOW (ref >60)
GFR calc non Af Amer: 6 mL/min — ABNORMAL LOW (ref >60)
GLUCOSE: 144 mg/dL — AB (ref 65–99)
PHOSPHORUS: 4.4 mg/dL (ref 2.5–4.6)
POTASSIUM: 4.1 mmol/L (ref 3.5–5.1)
Sodium: 132 mmol/L — ABNORMAL LOW (ref 135–145)

## 2016-11-02 LAB — HEPARIN LEVEL (UNFRACTIONATED): HEPARIN UNFRACTIONATED: 0.54 [IU]/mL (ref 0.30–0.70)

## 2016-11-02 LAB — CBC
HEMATOCRIT: 32.9 % — AB (ref 36.0–46.0)
HEMOGLOBIN: 10 g/dL — AB (ref 12.0–15.0)
MCH: 30.1 pg (ref 26.0–34.0)
MCHC: 30.4 g/dL (ref 30.0–36.0)
MCV: 99.1 fL (ref 78.0–100.0)
Platelets: 242 10*3/uL (ref 150–400)
RBC: 3.32 MIL/uL — ABNORMAL LOW (ref 3.87–5.11)
RDW: 14.3 % (ref 11.5–15.5)
WBC: 12.5 10*3/uL — ABNORMAL HIGH (ref 4.0–10.5)

## 2016-11-02 LAB — PROTIME-INR
INR: 1.13
PROTHROMBIN TIME: 14.5 s (ref 11.4–15.2)

## 2016-11-02 MED ORDER — WARFARIN - PHARMACIST DOSING INPATIENT
Freq: Every day | Status: DC
  Administered 2016-11-04 – 2016-11-05 (×2)

## 2016-11-02 MED ORDER — DILTIAZEM HCL 30 MG PO TABS
30.0000 mg | ORAL_TABLET | Freq: Four times a day (QID) | ORAL | Status: DC
  Administered 2016-11-02 – 2016-11-04 (×7): 30 mg via ORAL
  Filled 2016-11-02 (×7): qty 1

## 2016-11-02 MED ORDER — WARFARIN SODIUM 7.5 MG PO TABS
7.5000 mg | ORAL_TABLET | Freq: Once | ORAL | Status: AC
  Administered 2016-11-02: 7.5 mg via ORAL
  Filled 2016-11-02: qty 1

## 2016-11-02 NOTE — Progress Notes (Signed)
Patricia Ayala  MRN: [id number]  DOB/AGE: 81/06/1929 81 y.o.  Primary Care Physician:FUSCO,LAWRENCE J., MD  Admit date: 11/01/2016  Chief Complaint:  Chief Complaint  Patient presents with  . Shortness of Breath    S-Pt presented on  11/01/2016 with  Chief Complaint  Patient presents with  . Shortness of Breath  .    Pt today feels better.Pt's Husband and family are present in the room.  Meds  . cinacalcet  30 mg Oral Q supper  . doxycycline  100 mg Oral Q12H  . guaiFENesin  600 mg Oral BID  . multivitamin  1 tablet Oral Daily  . pantoprazole  40 mg Oral Daily  . pentoxifylline  400 mg Oral QAC lunch  . sevelamer carbonate  2,400 mg Oral TID WC  . simvastatin  10 mg Oral QHS        Physical Exam: Vital signs in last 24 hours: Temp:  [97.7 F (36.5 C)-100.5 F (38.1 C)] 98.1 F (36.7 C) (01/05 0015) Pulse Rate:  [88-145] 92 (01/05 0912) Resp:  [15-22] 17 (01/05 0912) BP: (79-136)/(43-96) 116/64 (01/05 0912) SpO2:  [87 %-98 %] 96 % (01/05 0912) Weight:  [164 lb 0.4 oz (74.4 kg)] 164 lb 0.4 oz (74.4 kg) (01/04 1310) Weight change: -7 lb 15.6 oz (-3.619 kg) Last BM Date: 10/30/16  Intake/Output from previous day: 01/04 0701 - 01/05 0700 In: 180 [I.V.:180] Out: 2033  No intake/output data recorded.   Physical Exam: General- pt is awake,alert, oriented to time place and person Resp- No acute REsp distress, Rhonchi+ CVS- S1S2 regular in rate and rhythm GIT- BS+, soft, NT, ND EXT- trace LE Edema, NO Cyanosis Access- AVF +    Lab Results: CBC  Recent Labs  11/01/16 0655 11/02/16 0515  WBC 12.6* 12.5*  HGB 10.6* 10.0*  HCT 33.8* 32.9*  PLT 216 242    BMET  Recent Labs  11/01/16 0655 11/02/16 0521  NA 133* 132*  K 4.2 4.1  CL 93* 93*  CO2 26 29  GLUCOSE 137* 144*  BUN 55* 39*  CREATININE 7.72* 6.06*  CALCIUM 9.2 8.2*    MICRO Recent Results (from the past 240 hour(s))  MRSA PCR Screening     Status: None   Collection Time:  10/29/16 12:28 PM  Result Value Ref Range Status   MRSA by PCR NEGATIVE NEGATIVE Final    Comment:        The GeneXpert MRSA Assay (FDA approved for NASAL specimens only), is one component of a comprehensive MRSA colonization surveillance program. It is not intended to diagnose MRSA infection nor to guide or monitor treatment for MRSA infections.   Blood Culture (routine x 2)     Status: None (Preliminary result)   Collection Time: 11/01/16  6:55 AM  Result Value Ref Range Status   Specimen Description BLOOD RIGHT HAND  Final   Special Requests BOTTLES DRAWN AEROBIC AND ANAEROBIC 6CC  Final   Culture NO GROWTH <12 HOURS  Final   Report Status PENDING  Incomplete  Blood Culture (routine x 2)     Status: None (Preliminary result)   Collection Time: 11/01/16  7:01 AM  Result Value Ref Range Status   Specimen Description BLOOD RIGHT HAND  Final   Special Requests BOTTLES DRAWN AEROBIC ONLY 4CC  Final   Culture NO GROWTH <12 HOURS  Final   Report Status PENDING  Incomplete      Lab Results  Component Value Date   CALCIUM 8.2 (  L) 11/02/2016   CAION 1.07 (L) 11/05/2014   PHOS 4.4 11/02/2016               Impression: 1)Renal  ESRD on HD                Pt is on Tue/Thurs/saturday schedule                Pt will be dialyzed today as it unable to have complete tx yesterday  2)HTN BP not at goal  Hd should help   3)Anemia In ESRD the goal for HGb is 9--11. Hgb at goal Will keep on epo  4)CKD Mineral-Bone Disorder Phosphorus on higher side.    On binders  Calcium is  at goal.  5)[facility]- Pt admitted with new onset Afib with RVR. PMD following  6)Electrolytes  Normokalemic NOrmonatremic   7)Acid base Co2 at goal     Plan:  Will dialyze today. Will try to take 2 liters off   Addendum Pt seen on HD Pt tolerating tx well.   Haly Feher S 11/02/2016, 10:13 AM

## 2016-11-02 NOTE — Progress Notes (Signed)
ANTICOAGULATION CONSULT NOTE  Pharmacy Consult for Heparin Indication: atrial fibrillation  No Known Allergies  Patient Measurements: Height: 5\' 4"  (162.6 cm) Weight: 164 lb 0.4 oz (74.4 kg) IBW/kg (Calculated) : 54.7 HEPARIN DW (KG): 70.2  Vital Signs: Temp: 100.4 F (38 C) (01/05 2000) Temp Source: Oral (01/05 2000) BP: 123/61 (01/05 1915) Pulse Rate: 103 (01/05 1915)  Labs:  Recent Labs  11/01/16 0655 11/01/16 1110 11/01/16 2340 11/02/16 0515 11/02/16 0521 11/02/16 2031  HGB 10.6*  --   --  10.0*  --   --   HCT 33.8*  --   --  32.9*  --   --   PLT 216  --   --  242  --   --   LABPROT  --   --   --  14.5  --   --   INR  --   --   --  1.13  --   --   HEPARINUNFRC  --   --   --   --   --  0.54  CREATININE 7.72*  --   --   --  6.06*  --   TROPONINI 0.07* 0.06* 0.06* 0.05*  --   --     Estimated Creatinine Clearance: 6.7 mL/min (by C-G formula based on SCr of 6.06 mg/dL (H)).   Medical History: Past Medical History:  Diagnosis Date  . Anemia 02/11/2012  . Anxiety   . Chronic kidney disease    not on dialysis yet, Tues, thurs, sat  . H/O metabolic acidosis   . Hyperparathyroidism, secondary (HCC)   . Hypertension    Dr. Regino Schultze, [facility], [place]  . Vitamin D deficiency     Medications:  Prescriptions Prior to Admission  Medication Sig Dispense Refill Last Dose  . albuterol (PROVENTIL HFA;VENTOLIN HFA) 108 (90 Base) MCG/ACT inhaler Inhale 2 puffs into the lungs every 6 (six) hours as needed for wheezing or shortness of breath. 1 Inhaler 2 unknown  . ALPRAZolam (XANAX) 0.5 MG tablet Take 1 tablet by mouth 3 (three) times daily as needed for anxiety.   Past Week at Unknown time  . amLODipine (NORVASC) 10 MG tablet Take 1 tablet (10 mg total) by mouth daily. 30 tablet 2 11/01/2016 at Unknown time  . BIOTIN PO Take 1,000 mg by mouth daily.    10/31/2016 at Unknown time  . cinacalcet (SENSIPAR) 30 MG tablet Take 30 mg by mouth at bedtime.   10/31/2016 at Unknown time  .  guaiFENesin (MUCINEX) 600 MG 12 hr tablet Take 1 tablet (600 mg total) by mouth 2 (two) times daily. 30 tablet 0 10/31/2016 at Unknown time  . labetalol (NORMODYNE) 200 MG tablet Take 200 mg by mouth 2 (two) times daily.    10/31/2016 at Unknown time  . lidocaine-prilocaine (EMLA) cream Apply 1 application topically as needed (for IV access).    unknown  . multivitamin (RENA-VIT) TABS tablet Take 1 tablet by mouth daily.   10/31/2016 at Unknown time  . pantoprazole (PROTONIX) 40 MG tablet Take 1 tablet by mouth daily.   10/31/2016 at Unknown time  . pentoxifylline (TRENTAL) 400 MG CR tablet Take 400 mg by mouth 3 (three) times daily with meals.   10/31/2016 at Unknown time  . promethazine (PHENERGAN) 25 MG tablet Take 25 mg by mouth every 6 (six) hours as needed for nausea or vomiting.   unknown  . sevelamer carbonate (RENVELA) 800 MG tablet Take 2,400 mg by mouth 3 (three) times daily with  meals. Take 800mg  with snacks daily.   10/31/2016 at Unknown time  . simvastatin (ZOCOR) 10 MG tablet Take 10 mg by mouth at bedtime.    10/31/2016 at Unknown time    Assessment: 83 y.o. female with medical history significant of ESRD on HD TTS, anemia, hypertension, recently discharged from the hospital on 1/2 after being treated for fluid overload. On arrival to ED, she was found to be in Afib with RVR, new onset. Was given lovenox 1mg /kg this AM and plan to continue anticoagulation with Heparin(no bolus per MD). Initial heparin level is therapeutic.  Goal of Therapy:  Heparin level 0.3-0.7 units/ml Monitor platelets by anticoagulation protocol: Yes   Plan:  Cont heparin infusion at 1000 units/hr Check anti-Xa level  daily while on heparin Continue to monitor H&H and platelets  Thanks for allowing pharmacy to be a part of this patient's care.  Talbert Cage, PharmD Clinical Pharmacist 11/02/2016,9:33 PM

## 2016-11-02 NOTE — Progress Notes (Signed)
PROGRESS NOTE    Patricia Ayala  UJW:[id number] DOB: 26-Jan-1929 DOA: 11/01/2016 PCP: [name]., MD   Brief Narrative: Patricia Ayala is a 83 y.o. female with medical history significant of ESRD on HD TTS, anemia, hypertension, recently discharged from the hospital on 1/2 after being treated for fluid overload from missing HD, presents to day with sob, associated with cough, myalgias, hypoxic on arrival.  Assessment & Plan:   Active Problems:   Anemia   ESRD (end stage renal disease) (HCC)   Benign essential HTN   URI (upper respiratory infection)   Atrial fibrillation with RVR (HCC)   Acute respiratory failure (HCC)   Acute respiratory failure with hypoxia:  Unclear etiology, suspect fluid overload with some bronchitis.  Febrile overnight. Blood cultures done early on 1/4. Leukocytosis persistent at 12,500.  Currently on doxycycline for bronchitis. She might need broad coverage if continues to have fevers.    Normocytic anemia:  Hemoglobin stable around 10.    ESRD on HD: TTS.   Hypertension; well controlled.    New onset afib with RVR:  Rate better controlled. Minimally elevated troponins. BNP is 1962.  Echocardiogram on 11/17 shows  Wall thickness was increased in a pattern of severe LVH. Systolic function was   normal. The estimated ejection fraction was in the range of 55%   to 60%. Wall motion was normal; there were no regional wall   motion abnormalities. Doppler parameters are consistent with   restrictive physiology, indicative of decreased left ventricular   diastolic compliance and/or increased left atrial pressure.   Doppler parameters are consistent with high ventricular filling   pressure.  Started her on cardizem gtt, still in afib/ aflutter with rates in 80 to 90's. Prn metoprolol.  Started her on IV heparin for anticoagulation.   Cardiology consulted for recommendations.         DVT prophylaxis: IV heparin) Code Status: (Full) Family  Communication: husband at bedside.  Disposition Plan: pending further eval.    Consultants:   Cardiology  Nephrology.    Procedures: none.    Antimicrobials: doxycycline.    Subjective: Reports better than yesterday. Febrile overnight.   Objective: Vitals:   11/02/16 1410 11/02/16 1420 11/02/16 1430 11/02/16 1445  BP: 134/62 (!) 110/48 (!) 119/59 126/62  Pulse: 84 91 81 97  Resp:      Temp:      TempSrc:      SpO2: 95%     Weight:      Height:        Intake/Output Summary (Last 24 hours) at 11/02/16 1454 Last data filed at 11/02/16 0600  Gross per 24 hour  Intake              180 ml  Output             2033 ml  Net            -1853 ml   Filed Weights   11/01/16 0616 11/01/16 1010 11/01/16 1310  Weight: 78 kg (172 lb) 74.4 kg (164 lb 0.4 oz) 74.4 kg (164 lb 0.4 oz)    Examination:  General exam: Appears calm and comfortable on 3 lit of [place] oxygen.  Respiratory system: Clear to auscultation. Respiratory effort normal. Cardiovascular system: S1 & S2 heard, RRR. No JVD, murmurs, rubs, gallops or clicks.  Gastrointestinal system: Abdomen is nondistended, soft and nontender. No organomegaly or masses felt. Normal bowel sounds heard. Central nervous system: Alert and oriented. No focal  neurological deficits. Extremities: trace edema.  Skin: No rashes, lesions or ulcers Psychiatry: Judgement and insight appear normal. Mood & affect appropriate.     Data Reviewed: I have personally reviewed following labs and imaging studies  CBC:  Recent Labs Lab 10/29/16 0740 10/29/16 1923 10/30/16 0604 11/01/16 0655 11/02/16 0515  WBC 12.8* 12.7* 11.4* 12.6* 12.5*  NEUTROABS 10.1*  --   --  10.5*  --   HGB 10.6* 10.3* 10.8* 10.6* 10.0*  HCT 33.8* 32.9* 34.0* 33.8* 32.9*  MCV 97.4 97.3 96.6 96.0 99.1  PLT 247 221 223 216 242   Basic Metabolic Panel:  Recent Labs Lab 10/29/16 0740 10/29/16 1923 10/30/16 0604 11/01/16 0655 11/02/16 0521  NA 141  --  137 133*  132*  K 4.4  --  4.0 4.2 4.1  CL 105  --  98* 93* 93*  CO2 27  --  27 26 29   GLUCOSE 126*  --  122* 137* 144*  BUN 57*  --  35* 55* 39*  CREATININE 9.92* 5.23* 6.49* 7.72* 6.06*  CALCIUM 9.3  --  8.9 9.2 8.2*  PHOS 5.6*  --   --   --  4.4   GFR: Estimated Creatinine Clearance: 6.7 mL/min (by C-G formula based on SCr of 6.06 mg/dL (H)). Liver Function Tests:  Recent Labs Lab 10/29/16 0740 11/01/16 0655 11/02/16 0521  AST  --  28  --   ALT  --  33  --   ALKPHOS  --  66  --   BILITOT  --  1.3*  --   PROT  --  7.0  --   ALBUMIN 3.7 3.3* 3.0*   No results for input(s): LIPASE, AMYLASE in the last 168 hours. No results for input(s): AMMONIA in the last 168 hours. Coagulation Profile:  Recent Labs Lab 11/02/16 0515  INR 1.13   Cardiac Enzymes:  Recent Labs Lab 10/29/16 0740 11/01/16 0655 11/01/16 1110 11/01/16 2340 11/02/16 0515  TROPONINI 0.05* 0.07* 0.06* 0.06* 0.05*   BNP (last 3 results) No results for input(s): PROBNP in the last 8760 hours. HbA1C: No results for input(s): HGBA1C in the last 72 hours. CBG: No results for input(s): GLUCAP in the last 168 hours. Lipid Profile: No results for input(s): CHOL, HDL, LDLCALC, TRIG, CHOLHDL, LDLDIRECT in the last 72 hours. Thyroid Function Tests: No results for input(s): TSH, T4TOTAL, FREET4, T3FREE, THYROIDAB in the last 72 hours. Anemia Panel: No results for input(s): VITAMINB12, FOLATE, FERRITIN, TIBC, IRON, RETICCTPCT in the last 72 hours. Sepsis Labs:  Recent Labs Lab 11/01/16 1308  LATICACIDVEN 1.50    Recent Results (from the past 240 hour(s))  MRSA PCR Screening     Status: None   Collection Time: 10/29/16 12:28 PM  Result Value Ref Range Status   MRSA by PCR NEGATIVE NEGATIVE Final    Comment:        The GeneXpert MRSA Assay (FDA approved for NASAL specimens only), is one component of a comprehensive MRSA colonization surveillance program. It is not intended to diagnose MRSA infection nor  to guide or monitor treatment for MRSA infections.   Blood Culture (routine x 2)     Status: None (Preliminary result)   Collection Time: 11/01/16  6:55 AM  Result Value Ref Range Status   Specimen Description BLOOD RIGHT HAND  Final   Special Requests BOTTLES DRAWN AEROBIC AND ANAEROBIC 6CC  Final   Culture NO GROWTH 1 DAY  Final   Report Status PENDING  Incomplete  Blood Culture (routine x 2)     Status: None (Preliminary result)   Collection Time: 11/01/16  7:01 AM  Result Value Ref Range Status   Specimen Description BLOOD RIGHT HAND  Final   Special Requests BOTTLES DRAWN AEROBIC ONLY 4CC  Final   Culture NO GROWTH 1 DAY  Final   Report Status PENDING  Incomplete         Radiology Studies: Dg Chest Port 1 View  Result Date: 11/02/2016 CLINICAL DATA:  PICC line placement. EXAM: PORTABLE CHEST 1 VIEW COMPARISON:  Radiograph of November 01, 2016. FINDINGS: Stable cardiomegaly. Atherosclerosis of thoracic aorta is noted. Central pulmonary vascular congestion is noted. No pneumothorax or significant pleural effusion is noted. Probable mild bibasilar subsegmental atelectasis is noted. Bony thorax is unremarkable. Interval placement of right internal jugular catheter with distal tip in expected position of the SVC. IMPRESSION: Cardiomegaly. Aortic atherosclerosis. Stable central pulmonary vascular congestion. Probable bibasilar subsegmental atelectasis. Interval placement of right internal jugular catheter with distal tip in expected position of SVC. Electronically Signed   By: Lupita Raider, M.D.   On: 11/02/2016 11:41   Dg Chest Port 1 View  Result Date: 11/01/2016 CLINICAL DATA:  Dyspnea this morning EXAM: PORTABLE CHEST 1 VIEW COMPARISON:  10/29/2016 FINDINGS: Marked cardiomegaly, unchanged. Mild vascular prominence. The vascular and interstitial pattern is improved from 10/29/2016. No airspace consolidation or significant alveolar edema. IMPRESSION: Improved from 10/29/2016.  Marked  cardiomegaly persists. Electronically Signed   By: Ellery Plunk M.D.   On: 11/01/2016 06:56        Scheduled Meds: . cinacalcet  30 mg Oral Q supper  . doxycycline  100 mg Oral Q12H  . guaiFENesin  600 mg Oral BID  . multivitamin  1 tablet Oral Daily  . pantoprazole  40 mg Oral Daily  . pentoxifylline  400 mg Oral QAC lunch  . sevelamer carbonate  2,400 mg Oral TID WC  . simvastatin  10 mg Oral QHS   Continuous Infusions: . diltiazem (CARDIZEM) infusion 10 mg/hr (11/02/16 1419)  . heparin 1,000 Units/hr (11/02/16 0912)     LOS: 1 day    Time spent: 35 minutes   Laterica Matarazzo, MD [facility] Pager [phone]  If 7PM-7AM, please contact night-coverage [email or web address] Password [email or web address] 11/02/2016, 2:54 PM

## 2016-11-02 NOTE — Care Management Note (Signed)
Case Management Note  Patient Details  Name: MOZELLE TORSON MRN: [id number] Date of Birth: 11-25-1928   Expected Discharge Date:      11/03/2016         Expected Discharge Plan:  Home w Home Health Services  In-House Referral:     Discharge planning Services  CM Consult  Post Acute Care Choice:    Choice offered to:     DME Arranged:    DME Agency:     HH Arranged:    HH Agency:     Status of Service:  In process, will continue to follow  If discussed at Long Length of Stay Meetings, dates discussed:    Additional Comments: CM unable to see patient. She is currently in dialysis session. CM has seen patient recent admission and patient had no needs. See previous note 3 days ago. [facility] is attempting to reach out to patient and offer their services due to recurrent admissions.  Ericson Nafziger, Chrystine Oiler, RN 11/02/2016, 4:55 PM

## 2016-11-02 NOTE — Consult Note (Addendum)
CARDIOLOGY CONSULT NOTE   Patient ID: ROSABELL GEYER MRN: [id number] DOB/AGE: October 10, 1929 83 y.o.  Admit Date: 11/01/2016 Referring Physician: Marta Antu MD Primary Physician: [name]., MD Consulting Cardiologist: Dina Rich MD Primary Cardiologist: New Reason for Consultation: Atrial fib with RVR  Clinical Summary Ms. Selway is a 83 y.o.female with known history of end-stage renal disease on hemodialysis, hypertension, chronic diastolic heart failure with fluid balance her dialysis, recently admitted to [facility] on 10/29/2016 in the setting of acute pulmonary edema and decompensated heart failure. Dialysis improved fluid status and she was sent home the following day. She was also found to have a possible upper respiratory infection, she was sent home with bronchodilators and mucolytic that antibiotic therapy at that time. She was to follow up for her usual dialysis treatment.  She returned to the emergency room on 11/01/2016 for worsening dyspnea. She was found to be in atrial fib with RVR. EKG revealed bradycardia 125 bpm with LVH and T-wave inversion in V5 and V6.Marland Kitchen Blood pressure 118/76, O2 sat 94% on 3 L. She was afebrile. Labs revealed sodium of 133, chloride 93, glucose 137, BUN 55, creatinine 7.72. She was found to be mildly anemic with a hemoglobin of 10.6, hematocrit 33.8, white blood cells 12.6, platelets 216. BNP 1962. Troponin 0.07. Chest x-ray revealed cardiomegaly but no air space consolidation or alveolar edema. She was started on diltiazem drip after 20 mg bolus, given morphine, and admitted to ICU.      No Known Allergies  Medications Scheduled Medications: . cinacalcet  30 mg Oral Q supper  . doxycycline  100 mg Oral Q12H  . guaiFENesin  600 mg Oral BID  . multivitamin  1 tablet Oral Daily  . pantoprazole  40 mg Oral Daily  . pentoxifylline  400 mg Oral QAC lunch  . sevelamer carbonate  2,400 mg Oral TID WC  . simvastatin  10 mg Oral  QHS     Infusions: . diltiazem (CARDIZEM) infusion 15 mg/hr (11/02/16 0618)  . heparin 1,000 Units/hr (11/02/16 0912)     PRN Medications:  sodium chloride, sodium chloride, albuterol, ALPRAZolam, lidocaine (PF), lidocaine-prilocaine, lidocaine-prilocaine, metoprolol, pentafluoroprop-tetrafluoroeth, promethazine, sevelamer carbonate   Past Medical History:  Diagnosis Date  . Anemia 02/11/2012  . Anxiety   . Chronic kidney disease    not on dialysis yet, Tues, thurs, sat  . H/O metabolic acidosis   . Hyperparathyroidism, secondary (HCC)   . Hypertension    Dr. Regino Schultze, [place], [place]  . Vitamin D deficiency     Past Surgical History:  Procedure Laterality Date  . ABDOMINAL HYSTERECTOMY    . AV FISTULA PLACEMENT  04/01/2012   Procedure: ARTERIOVENOUS (AV) FISTULA CREATION;  Surgeon: Sherren Kerns, MD;  Location: [facility] OR;  Service: Vascular;  Laterality: Left;  . CHOLECYSTECTOMY    . COLON SURGERY     for a blockage  . EYE SURGERY     bilateral cataracts, /w IOL - 2013  . FISTULOGRAM N/A 11/05/2014   Procedure: FISTULOGRAM;  Surgeon: Larina Earthly, MD;  Location: [facility] CATH LAB;  Service: Cardiovascular;  Laterality: N/A;  . INSERTION OF DIALYSIS CATHETER  04/10/2012   Procedure: INSERTION OF DIALYSIS CATHETER;  Surgeon: Pryor Ochoa, MD;  Location: [facility] OR;  Service: Vascular;  Laterality: N/A;  Insertion Diatek Catheter Right Internal Jugular  . LIGATION OF COMPETING BRANCHES OF ARTERIOVENOUS FISTULA  11/07/2012   Procedure: LIGATION OF COMPETING BRANCHES OF ARTERIOVENOUS FISTULA;  Surgeon: Chuck Hint,  MD;  Location: [facility] OR;  Service: Vascular;  Laterality: Left;  Brachio/Cephalic Fistula  . SHUNTOGRAM N/A 11/03/2012   Procedure: Betsey Amen;  Surgeon: Chuck Hint, MD;  Location: [facility] CATH LAB;  Service: Cardiovascular;  Laterality: N/A;  . SHUNTOGRAM Left 03/09/2013   Procedure: Fistulogram;  Surgeon: Fransisco Hertz, MD;  Location: [facility] CATH LAB;  Service:  Cardiovascular;  Laterality: Left;  . SHUNTOGRAM Left 11/23/2013   Procedure: FISTULOGRAM;  Surgeon: Fransisco Hertz, MD;  Location: [facility] CATH LAB;  Service: Cardiovascular;  Laterality: Left;    Family History  Problem Relation Age of Onset  . Stroke Mother   . Cancer Father   . Heart attack Father     Social History Ms. Eagleton reports that she has never smoked. She has never used smokeless tobacco. Ms. Marquess reports that she does not drink alcohol.  Review of Systems Complete review of systems are found to be negative unless outlined in H&P above.  Physical Examination Blood pressure 101/65, pulse 91, temperature 98.1 F (36.7 C), temperature source Oral, resp. rate 18, height 5\' 4"  (1.626 m), weight 164 lb 0.4 oz (74.4 kg), SpO2 95 %.  Intake/Output Summary (Last 24 hours) at 11/02/16 1106 Last data filed at 11/02/16 0600  Gross per 24 hour  Intake              180 ml  Output             2033 ml  Net            -1853 ml    Telemetry:  GEN: HEENT: Conjunctiva and lids normal, oropharynx clear with moist mucosa. Neck: Supple, no elevated JVP or carotid bruits, no thyromegaly. Lungs: Clear to auscultation, nonlabored breathing at rest. Cardiac: Regular rate and rhythm, no S3 or significant systolic murmur, no pericardial rub. Abdomen: Soft, nontender, no hepatomegaly, bowel sounds present, no guarding or rebound. Extremities: No pitting edema, distal pulses 2+. Skin: Warm and dry. Musculoskeletal: No kyphosis. Neuropsychiatric: Alert and oriented x3, affect grossly appropriate.  Prior Cardiac Testing/Procedures 1.Echocardiogram 09/26/2016 Left ventricle: The cavity size was normal. Wall thickness was   increased in a pattern of severe LVH. Systolic function was   normal. The estimated ejection fraction was in the range of 55%   to 60%. Wall motion was normal; there were no regional wall   motion abnormalities. Doppler parameters are consistent with   restrictive  physiology, indicative of decreased left ventricular   diastolic compliance and/or increased left atrial pressure.   Doppler parameters are consistent with high ventricular filling   pressure. - Aortic valve: Trileaflet; mildly thickened, mildly calcified   leaflets. Morphologically, there appears to be mild aortic   stenosis. There was trivial regurgitation. - Mitral valve: Calcified annulus. Mildly thickened leaflets .   There was mild regurgitation. - Left atrium: The atrium was severely dilated. - Tricuspid valve: There was mild-moderate regurgitation. - Pulmonary arteries: PA peak pressure: 36 mm Hg (S). - Pericardium, extracardiac: Small pericardial effusion. Features   were not consistent with tamponade physiology.  Lab Results  Basic Metabolic Panel:  Recent Labs Lab 10/29/16 0740 10/29/16 1923 10/30/16 0604 11/01/16 0655 11/02/16 0521  NA 141  --  137 133* 132*  K 4.4  --  4.0 4.2 4.1  CL 105  --  98* 93* 93*  CO2 27  --  27 26 29   GLUCOSE 126*  --  122* 137* 144*  BUN 57*  --  35* 55* 39*  CREATININE 9.92* 5.23* 6.49* 7.72* 6.06*  CALCIUM 9.3  --  8.9 9.2 8.2*  PHOS 5.6*  --   --   --  4.4    Liver Function Tests:  Recent Labs Lab 10/29/16 0740 11/01/16 0655 11/02/16 0521  AST  --  28  --   ALT  --  33  --   ALKPHOS  --  66  --   BILITOT  --  1.3*  --   PROT  --  7.0  --   ALBUMIN 3.7 3.3* 3.0*    CBC:  Recent Labs Lab 10/29/16 0740 10/29/16 1923 10/30/16 0604 11/01/16 0655 11/02/16 0515  WBC 12.8* 12.7* 11.4* 12.6* 12.5*  NEUTROABS 10.1*  --   --  10.5*  --   HGB 10.6* 10.3* 10.8* 10.6* 10.0*  HCT 33.8* 32.9* 34.0* 33.8* 32.9*  MCV 97.4 97.3 96.6 96.0 99.1  PLT 247 221 223 216 242    Cardiac Enzymes:  Recent Labs Lab 10/29/16 0740 11/01/16 0655 11/01/16 1110 11/01/16 2340 11/02/16 0515  TROPONINI 0.05* 0.07* 0.06* 0.06* 0.05*    Radiology: Dg Chest Port 1 View  Result Date: 11/01/2016 CLINICAL DATA:  Dyspnea this morning  EXAM: PORTABLE CHEST 1 VIEW COMPARISON:  10/29/2016 FINDINGS: Marked cardiomegaly, unchanged. Mild vascular prominence. The vascular and interstitial pattern is improved from 10/29/2016. No airspace consolidation or significant alveolar edema. IMPRESSION: Improved from 10/29/2016.  Marked cardiomegaly persists. Electronically Signed   By: Ellery Plunk M.D.   On: 11/01/2016 06:56     ECG: Atrial fibrillation with RVR heart rate 125 beats minute with T-wave inversion noted in V5 and V6 with evidence of LVH.   Impression and Recommendations  1. New Onset Atrial fib with RVR: New finding compared to prior EKG on 10/29/2016 revealing normal sinus rhythm with PACs. The patient is currently undergoing sterile procedure and has not been assessed. Heart rate is much better controlled on review of telemetry with heart rate in the 80s A. Fib/flutter. Remains on diltiazem drip. CHADS VASC Score of 3. Consider DOAC.   Most recent echocardiogram completed in November 2017 revealed normal LV systolic function, restrictive pathology noted.  2. Hypertension: Blood pressure is much better controlled currently on IV drip of diltiazem, and emergent dialysis. May be able to wean diltiazem, and convert to by mouth today.  3. End-stage renal disease: Emergent dialysis completed on 11/01/2016, removing 2033 cc. She is currently undergoing sterile procedure  For insertion of IJ access . Unable fo to assess at this time.  4.Demand ischemia: Slight elevation in troponin but flat, 0.06 ;0.06; and 0.05 respectively.   5. Anemia: Chronic in the setting of end-stage renal disease.  .  SignedBettey Mare. Lawrence NP AACC  11/02/2016, 11:06 AM Co-Sign MD  Patient seen and discussed with NP Lyman Bishop, I agree with her documentation. 83 yo female history of ESRD,. HTN admitted with new onset afib with RVR. Rates managed with dilt drip initially.    08/2016 echo LVEF 55-60%, restrictive diastolic dysfunction K  4.2Trop 0.07-->0.05, Cr 7.72, Hgb 10.6, Plt 216, BNP 1962,   Flat nonspecific troponin in setting of afib with RVR, not specific for ischemia. No ischemic testing is planned. We will start short acting dilt and work to transitoin off dilt gtt. CHADS2Vasc score is 3. I discussed risks and benefits of anticoagulation with family in details. Given her ESRD coumadin would be preferred management. Family agrees to start, we will place pharmacy consult. Can stop heparin drip once INR  therapeutic in hospital, would be ok to discharge prior to INR being therapeutic without bridging.   Dominga Ferry MD

## 2016-11-02 NOTE — Care Management Important Message (Signed)
Important Message  Patient Details  Name: Patricia Ayala MRN: [id number] Date of Birth: 1929-07-20   Medicare Important Message Given:  Yes    Ronne Stefanski, Chrystine Oiler, RN 11/02/2016, 4:58 PM

## 2016-11-02 NOTE — Progress Notes (Signed)
ANTICOAGULATION CONSULT NOTE - Initial Consult  Pharmacy Consult for coumadin Indication: atrial fibrillation  No Known Allergies  Patient Measurements: Height: 5\' 4"  (162.6 cm) Weight: 164 lb 0.4 oz (74.4 kg) IBW/kg (Calculated) : 54.7   Vital Signs: Temp: 99.4 F (37.4 C) (01/05 1100) Temp Source: Oral (01/05 1100) BP: 112/71 (01/05 1515) Pulse Rate: 108 (01/05 1515)  Labs:  Recent Labs  11/01/16 0655 11/01/16 1110 11/01/16 2340 11/02/16 0515 11/02/16 0521  HGB 10.6*  --   --  10.0*  --   HCT 33.8*  --   --  32.9*  --   PLT 216  --   --  242  --   LABPROT  --   --   --  14.5  --   INR  --   --   --  1.13  --   CREATININE 7.72*  --   --   --  6.06*  TROPONINI 0.07* 0.06* 0.06* 0.05*  --     Estimated Creatinine Clearance: 6.7 mL/min (by C-G formula based on SCr of 6.06 mg/dL (H)).   Medical History: Past Medical History:  Diagnosis Date  . Anemia 02/11/2012  . Anxiety   . Chronic kidney disease    not on dialysis yet, Tues, thurs, sat  . H/O metabolic acidosis   . Hyperparathyroidism, secondary (HCC)   . Hypertension    Dr. Regino Schultze, [place], [place]  . Vitamin D deficiency     Medications:  Prescriptions Prior to Admission  Medication Sig Dispense Refill Last Dose  . albuterol (PROVENTIL HFA;VENTOLIN HFA) 108 (90 Base) MCG/ACT inhaler Inhale 2 puffs into the lungs every 6 (six) hours as needed for wheezing or shortness of breath. 1 Inhaler 2 unknown  . ALPRAZolam (XANAX) 0.5 MG tablet Take 1 tablet by mouth 3 (three) times daily as needed for anxiety.   Past Week at Unknown time  . amLODipine (NORVASC) 10 MG tablet Take 1 tablet (10 mg total) by mouth daily. 30 tablet 2 11/01/2016 at Unknown time  . BIOTIN PO Take 1,000 mg by mouth daily.    10/31/2016 at Unknown time  . cinacalcet (SENSIPAR) 30 MG tablet Take 30 mg by mouth at bedtime.   10/31/2016 at Unknown time  . guaiFENesin (MUCINEX) 600 MG 12 hr tablet Take 1 tablet (600 mg total) by mouth 2 (two) times  daily. 30 tablet 0 10/31/2016 at Unknown time  . labetalol (NORMODYNE) 200 MG tablet Take 200 mg by mouth 2 (two) times daily.    10/31/2016 at Unknown time  . lidocaine-prilocaine (EMLA) cream Apply 1 application topically as needed (for IV access).    unknown  . multivitamin (RENA-VIT) TABS tablet Take 1 tablet by mouth daily.   10/31/2016 at Unknown time  . pantoprazole (PROTONIX) 40 MG tablet Take 1 tablet by mouth daily.   10/31/2016 at Unknown time  . pentoxifylline (TRENTAL) 400 MG CR tablet Take 400 mg by mouth 3 (three) times daily with meals.   10/31/2016 at Unknown time  . promethazine (PHENERGAN) 25 MG tablet Take 25 mg by mouth every 6 (six) hours as needed for nausea or vomiting.   unknown  . sevelamer carbonate (RENVELA) 800 MG tablet Take 2,400 mg by mouth 3 (three) times daily with meals. Take 800mg  with snacks daily.   10/31/2016 at Unknown time  . simvastatin (ZOCOR) 10 MG tablet Take 10 mg by mouth at bedtime.    10/31/2016 at Unknown time    Assessment: 83 yo lady on  IV heparin to start coumadin for afib.  INR today 1.13 Goal of Therapy:  INR 2-3 Monitor platelets by anticoagulation protocol: Yes   Plan:  Coumadin 7.5 mg po today Daily PT/INR  Patricia Ayala 11/02/2016,3:29 PM

## 2016-11-02 NOTE — Procedures (Signed)
    HEMODIALYSIS TREATMENT NOTE:   3 hour dialysis completed via left upper arm AVF (15g/antegrade). Goal NOT met: Unable to tolerate removal of 3 liters as ordered. Ultrafiltration interrupted x20 minutes for hypotension.  Net UF 2.2 liters.  All blood returned.  Hemostasis achieved in 20 minutes. Report given to [facility], Therapist, sports.  Rockwell Alexandria, RN, CDN

## 2016-11-03 LAB — CBC
HEMATOCRIT: 32.2 % — AB (ref 36.0–46.0)
HEMOGLOBIN: 9.9 g/dL — AB (ref 12.0–15.0)
MCH: 30.7 pg (ref 26.0–34.0)
MCHC: 30.7 g/dL (ref 30.0–36.0)
MCV: 100 fL (ref 78.0–100.0)
Platelets: 264 10*3/uL (ref 150–400)
RBC: 3.22 MIL/uL — ABNORMAL LOW (ref 3.87–5.11)
RDW: 14.2 % (ref 11.5–15.5)
WBC: 11.1 10*3/uL — ABNORMAL HIGH (ref 4.0–10.5)

## 2016-11-03 LAB — HEPARIN LEVEL (UNFRACTIONATED): Heparin Unfractionated: 0.43 IU/mL (ref 0.30–0.70)

## 2016-11-03 LAB — PROTIME-INR
INR: 1.11
PROTHROMBIN TIME: 14.3 s (ref 11.4–15.2)

## 2016-11-03 MED ORDER — COUMADIN BOOK
Freq: Once | Status: AC
  Administered 2016-11-03: 13:00:00
  Filled 2016-11-03: qty 1

## 2016-11-03 MED ORDER — WARFARIN VIDEO: Freq: Once | Status: DC

## 2016-11-03 MED ORDER — IPRATROPIUM-ALBUTEROL 0.5-2.5 (3) MG/3ML IN SOLN
3.0000 mL | Freq: Four times a day (QID) | RESPIRATORY_TRACT | Status: DC | PRN
  Administered 2016-11-07: 3 mL via RESPIRATORY_TRACT

## 2016-11-03 MED ORDER — WARFARIN SODIUM 5 MG PO TABS
6.0000 mg | ORAL_TABLET | Freq: Once | ORAL | Status: AC
  Administered 2016-11-03: 16:00:00 6 mg via ORAL
  Filled 2016-11-03: qty 1

## 2016-11-03 NOTE — Consult Note (Signed)
   [facility] CM Inpatient Consult   11/03/2016  Patricia Ayala [birth year] [id number]   Chart review revealed patient eligible for [facility] Care Management services and post hospital discharge follow up related to a diagnosis of Atrial fib and frequent readmissions. Patient was evaluated for community based chronic disease management services with [facility] care Management Program as a benefit of patient's [employer]. Went to visit with the patient at the bedside to explain [facility] Care Management services, but patient was receiving dialysis and was sleeping. There were no family members present to discuss [facility] and with whom to leave information. Spoke with inpatient care manager who stated patient's husband would be the person to contact for patient. Husband not present at this time. For additional questions please contact:   Wesleigh Markovic RN, BSN [facility] Liaison  [phone]) Business Mobile [phone]) Toll free office

## 2016-11-03 NOTE — Progress Notes (Signed)
Subjective: Interval History: has complaints  patient denies any difficulty breathing. Her appetite is poor but no nausea or vomiting. Objective: Vital signs in last 24 hours: Temp:  [97.7 F (36.5 C)-100.4 F (38 C)] 99.3 F (37.4 C) (01/06 0738) Pulse Rate:  [75-115] 96 (01/06 0900) Resp:  [13-20] 16 (01/06 0900) BP: (83-140)/(48-80) 98/75 (01/06 0900) SpO2:  [85 %-100 %] 96 % (01/06 0900) Weight:  [72.6 kg (160 lb 0.9 oz)] 72.6 kg (160 lb 0.9 oz) (01/06 0400) Weight change: -1.8 kg (-3 lb 15.5 oz)  Intake/Output from previous day: 01/05 0701 - 01/06 0700 In: 330.5 [I.V.:330.5] Out: 2930 [Urine:730] Intake/Output this shift: Total I/O In: 165 [I.V.:165] Out: -   General appearance: alert, cooperative and no distress Resp: diminished breath sounds bilaterally Cardio: irregularly irregular rhythm Extremities: No edema  Lab Results:  Recent Labs  11/02/16 0515 11/03/16 0440  WBC 12.5* 11.1*  HGB 10.0* 9.9*  HCT 32.9* 32.2*  PLT 242 264   BMET:  Recent Labs  11/01/16 0655 11/02/16 0521  NA 133* 132*  K 4.2 4.1  CL 93* 93*  CO2 26 29  GLUCOSE 137* 144*  BUN 55* 39*  CREATININE 7.72* 6.06*  CALCIUM 9.2 8.2*   No results for input(s): PTH in the last 72 hours. Iron Studies: No results for input(s): IRON, TIBC, TRANSFERRIN, FERRITIN in the last 72 hours.  Studies/Results: Dg Chest Port 1 View  Result Date: 11/02/2016 CLINICAL DATA:  PICC line placement. EXAM: PORTABLE CHEST 1 VIEW COMPARISON:  Radiograph of November 01, 2016. FINDINGS: Stable cardiomegaly. Atherosclerosis of thoracic aorta is noted. Central pulmonary vascular congestion is noted. No pneumothorax or significant pleural effusion is noted. Probable mild bibasilar subsegmental atelectasis is noted. Bony thorax is unremarkable. Interval placement of right internal jugular catheter with distal tip in expected position of the SVC. IMPRESSION: Cardiomegaly. Aortic atherosclerosis. Stable central pulmonary  vascular congestion. Probable bibasilar subsegmental atelectasis. Interval placement of right internal jugular catheter with distal tip in expected position of SVC. Electronically Signed   By: Lupita Raider, M.D.   On: 11/02/2016 11:41    I have reviewed the patient's current medications.  Assessment/Plan  Problem #1 a trial fibrillation: Heart rate is controlled. Problem but to end-stage renal disease: She is status post hemodialysis the last 2 days. Presently her potassium is normal. Except her appetite she didn't have any other sinus symptoms off uremia. Problem #3. Anemia: Her hemoglobin is low. She is on Epogen Problem #4 metabolic bone disease: Her calcium and her phosphorus is in range. Problem #5 hypertension: Her blood pressure is reasonably controlled Problem #6 difficulty breathing: Presently she is feeling better. Patient had dialysis yesterday and we are able to remove about 2200 mL. Plan: Patient does require dialysis today. Her next dialysis will be on Monday. We'll check renal panel and CBC in the morning.   LOS: 2 days   Arrion Broaddus S 11/03/2016,9:47 AM

## 2016-11-03 NOTE — Progress Notes (Signed)
ANTICOAGULATION CONSULT NOTE  Pharmacy Consult for Heparin, coumadin Indication: atrial fibrillation  No Known Allergies  Patient Measurements: Height: 5\' 4"  (162.6 cm) Weight: 160 lb 0.9 oz (72.6 kg) IBW/kg (Calculated) : 54.7 HEPARIN DW (KG): 70.2  Vital Signs: Temp: 99.3 F (37.4 C) (01/06 0738) Temp Source: Oral (01/06 0738) BP: 115/61 (01/06 0800) Pulse Rate: 98 (01/06 0800)  Labs:  Recent Labs  11/01/16 0655 11/01/16 1110 11/01/16 2340 11/02/16 0515 11/02/16 0521 11/02/16 2031 11/03/16 0440  HGB 10.6*  --   --  10.0*  --   --  9.9*  HCT 33.8*  --   --  32.9*  --   --  32.2*  PLT 216  --   --  242  --   --  264  LABPROT  --   --   --  14.5  --   --  14.3  INR  --   --   --  1.13  --   --  1.11  HEPARINUNFRC  --   --   --   --   --  0.54 0.43  CREATININE 7.72*  --   --   --  6.06*  --   --   TROPONINI 0.07* 0.06* 0.06* 0.05*  --   --   --     Estimated Creatinine Clearance: 6.6 mL/min (by C-G formula based on SCr of 6.06 mg/dL (H)).   Medical History: Past Medical History:  Diagnosis Date  . Anemia 02/11/2012  . Anxiety   . Chronic kidney disease    not on dialysis yet, Tues, thurs, sat  . H/O metabolic acidosis   . Hyperparathyroidism, secondary (HCC)   . Hypertension    Dr. Regino Schultze, [place],   . Vitamin D deficiency     Medications:  Prescriptions Prior to Admission  Medication Sig Dispense Refill Last Dose  . albuterol (PROVENTIL HFA;VENTOLIN HFA) 108 (90 Base) MCG/ACT inhaler Inhale 2 puffs into the lungs every 6 (six) hours as needed for wheezing or shortness of breath. 1 Inhaler 2 unknown  . ALPRAZolam (XANAX) 0.5 MG tablet Take 1 tablet by mouth 3 (three) times daily as needed for anxiety.   Past Week at Unknown time  . amLODipine (NORVASC) 10 MG tablet Take 1 tablet (10 mg total) by mouth daily. 30 tablet 2 11/01/2016 at Unknown time  . BIOTIN PO Take 1,000 mg by mouth daily.    10/31/2016 at Unknown time  . cinacalcet (SENSIPAR) 30 MG  tablet Take 30 mg by mouth at bedtime.   10/31/2016 at Unknown time  . guaiFENesin (MUCINEX) 600 MG 12 hr tablet Take 1 tablet (600 mg total) by mouth 2 (two) times daily. 30 tablet 0 10/31/2016 at Unknown time  . labetalol (NORMODYNE) 200 MG tablet Take 200 mg by mouth 2 (two) times daily.    10/31/2016 at Unknown time  . lidocaine-prilocaine (EMLA) cream Apply 1 application topically as needed (for IV access).    unknown  . multivitamin (RENA-VIT) TABS tablet Take 1 tablet by mouth daily.   10/31/2016 at Unknown time  . pantoprazole (PROTONIX) 40 MG tablet Take 1 tablet by mouth daily.   10/31/2016 at Unknown time  . pentoxifylline (TRENTAL) 400 MG CR tablet Take 400 mg by mouth 3 (three) times daily with meals.   10/31/2016 at Unknown time  . promethazine (PHENERGAN) 25 MG tablet Take 25 mg by mouth every 6 (six) hours as needed for nausea or vomiting.   unknown  . sevelamer  carbonate (RENVELA) 800 MG tablet Take 2,400 mg by mouth 3 (three) times daily with meals. Take 800mg  with snacks daily.   10/31/2016 at Unknown time  . simvastatin (ZOCOR) 10 MG tablet Take 10 mg by mouth at bedtime.    10/31/2016 at Unknown time    Assessment: 83 y.o. female with medical history significant of ESRD on HD TTS, anemia, hypertension, recently discharged from the hospital on 1/2 after being treated for fluid overload. On arrival to ED, she was found to be in Afib with RVR, new onset. Was given lovenox 1mg /kg and plan to continue anticoagulation with Heparin(no bolus per MD). Coumadin started yesterday.  Heparin level therapeutic this am.    Goal of Therapy:  INR 2-3 Heparin level 0.3-0.7 units/ml Monitor platelets by anticoagulation protocol: Yes   Plan:  Cont heparin infusion at 1000 units/hr Check anti-Xa level  daily while on heparin Continue to monitor H&H and platelets Coumadin 6 mg po today  Thanks for allowing pharmacy to be a part of this patient's care.  Talbert Cage, PharmD Clinical  Pharmacist 11/03/2016,8:32 AM

## 2016-11-03 NOTE — Progress Notes (Signed)
PROGRESS NOTE    Patricia Ayala  UJW:[id number] DOB: 15-Feb-1929 DOA: 11/01/2016 PCP: [name]., MD   Brief Narrative: Patricia Ayala is a 83 y.o. female with medical history significant of ESRD on HD TTS, anemia, hypertension, recently discharged from the hospital on 1/2 after being treated for fluid overload from missing HD, presents to day with sob, associated with cough, myalgias, hypoxic on arrival.  Assessment & Plan:   Active Problems:   Anemia   ESRD (end stage renal disease) (HCC)   Benign essential HTN   URI (upper respiratory infection)   Atrial fibrillation with RVR (HCC)   Acute respiratory failure (HCC)   Acute respiratory failure with hypoxia:  Unclear etiology, suspect fluid overload with some bronchitis.  Febrile overnight. Blood cultures done early on 1/4. Leukocytosis persistent at 12,500.  Currently on doxycycline for bronchitis. She might need broad coverage if continues to have fevers. So far cultures have been negative. Added on robitussin for cough.  Wean her off the oxygen as needed.    Normocytic anemia:  Hemoglobin stable around 10.    ESRD on HD: TTS.   Hypertension; well controlled.    New onset afib with RVR:  Rate better controlled. Minimally elevated troponins. BNP is 1962.  Echocardiogram on 11/17 shows  Wall thickness was increased in a pattern of severe LVH. Systolic function was   normal. The estimated ejection fraction was in the range of 55%   to 60%. Wall motion was normal; there were no regional wall   motion abnormalities. Doppler parameters are consistent with   restrictive physiology, indicative of decreased left ventricular   diastolic compliance and/or increased left atrial pressure.   Doppler parameters are consistent with high ventricular filling   pressure.  Started her on cardizem gtt, still in afib/ aflutter with rates in 80 to 90's. Prn metoprolol.  Started her on IV heparin for anticoagulation.   Cardiology  consulted for recommendations.  Today wean her off the cardizem and start her on oral cardizem and metoprolol as needed.   Repeat CXR shows Stable central pulmonary vascular congestion. Probable bibasilar subsegmental atelectasis. Interval placement of right internal jugular catheter with distal tip in expected position of SVC. No evidence of pneumonia or infiltrates.         DVT prophylaxis: IV heparin) Code Status: (Full) Family Communication: husband at bedside.  Disposition Plan: pending further eval. PT eval.    Consultants:   Cardiology  Nephrology.    Procedures: none.    Antimicrobials: doxycycline.    Subjective: Reports better than yesterday. Febrile overnight. Reports she feels weak in the legs.   Objective: Vitals:   11/03/16 1326 11/03/16 1600 11/03/16 1646 11/03/16 1707  BP: 103/76 109/75  108/67  Pulse: 87 90 77   Resp: Temp:   99.2 F (37.3 C)   TempSrc:   Oral   SpO2: 96% 96% 96%   Weight:      Height:        Intake/Output Summary (Last 24 hours) at 11/03/16 1917 Last data filed at 11/03/16 1742  Gross per 24 hour  Intake           436.35 ml  Output              730 ml  Net          -293.65 ml   Filed Weights   11/01/16 1010 11/01/16 1310 11/03/16 0400  Weight: 74.4 kg (164 lb 0.4  oz) 74.4 kg (164 lb 0.4 oz) 72.6 kg (160 lb 0.9 oz)    Examination:  General exam: Appears calm and comfortable on 3 lit of [employer] oxygen.  Respiratory system: scattered wheezing. Air entry fair.  Cardiovascular system: S1 & S2 heard, RRR. No JVD, murmurs, rubs, gallops or clicks.  Gastrointestinal system: Abdomen is nondistended, soft and nontender. No organomegaly or masses felt. Normal bowel sounds heard. Central nervous system: Alert and oriented. No focal neurological deficits. Extremities: trace edema.  Skin: No rashes, lesions or ulcers Psychiatry: Judgement and insight appear normal. Mood & affect appropriate.     Data Reviewed: I  have personally reviewed following labs and imaging studies  CBC:  Recent Labs Lab 10/29/16 0740 10/29/16 1923 10/30/16 0604 11/01/16 0655 11/02/16 0515 11/03/16 0440  WBC 12.8* 12.7* 11.4* 12.6* 12.5* 11.1*  NEUTROABS 10.1*  --   --  10.5*  --   --   HGB 10.6* 10.3* 10.8* 10.6* 10.0* 9.9*  HCT 33.8* 32.9* 34.0* 33.8* 32.9* 32.2*  MCV 97.4 97.3 96.6 96.0 99.1 100.0  PLT 247 221 223 216 242 264   Basic Metabolic Panel:  Recent Labs Lab 10/29/16 0740 10/29/16 1923 10/30/16 0604 11/01/16 0655 11/02/16 0521  NA 141  --  137 133* 132*  K 4.4  --  4.0 4.2 4.1  CL 105  --  98* 93* 93*  CO2 27  --  27 26 29   GLUCOSE 126*  --  122* 137* 144*  BUN 57*  --  35* 55* 39*  CREATININE 9.92* 5.23* 6.49* 7.72* 6.06*  CALCIUM 9.3  --  8.9 9.2 8.2*  PHOS 5.6*  --   --   --  4.4   GFR: Estimated Creatinine Clearance: 6.6 mL/min (by C-G formula based on SCr of 6.06 mg/dL (H)). Liver Function Tests:  Recent Labs Lab 10/29/16 0740 11/01/16 0655 11/02/16 0521  AST  --  28  --   ALT  --  33  --   ALKPHOS  --  66  --   BILITOT  --  1.3*  --   PROT  --  7.0  --   ALBUMIN 3.7 3.3* 3.0*   No results for input(s): LIPASE, AMYLASE in the last 168 hours. No results for input(s): AMMONIA in the last 168 hours. Coagulation Profile:  Recent Labs Lab 11/02/16 0515 11/03/16 0440  INR 1.13 1.11   Cardiac Enzymes:  Recent Labs Lab 10/29/16 0740 11/01/16 0655 11/01/16 1110 11/01/16 2340 11/02/16 0515  TROPONINI 0.05* 0.07* 0.06* 0.06* 0.05*   BNP (last 3 results) No results for input(s): PROBNP in the last 8760 hours. HbA1C: No results for input(s): HGBA1C in the last 72 hours. CBG: No results for input(s): GLUCAP in the last 168 hours. Lipid Profile: No results for input(s): CHOL, HDL, LDLCALC, TRIG, CHOLHDL, LDLDIRECT in the last 72 hours. Thyroid Function Tests: No results for input(s): TSH, T4TOTAL, FREET4, T3FREE, THYROIDAB in the last 72 hours. Anemia Panel: No  results for input(s): VITAMINB12, FOLATE, FERRITIN, TIBC, IRON, RETICCTPCT in the last 72 hours. Sepsis Labs:  Recent Labs Lab 11/01/16 8757  LATICACIDVEN 1.50    Recent Results (from the past 240 hour(s))  MRSA PCR Screening     Status: None   Collection Time: 10/29/16 12:28 PM  Result Value Ref Range Status   MRSA by PCR NEGATIVE NEGATIVE Final    Comment:        The GeneXpert MRSA Assay (FDA approved for NASAL specimens only), is  one component of a comprehensive MRSA colonization surveillance program. It is not intended to diagnose MRSA infection nor to guide or monitor treatment for MRSA infections.   Blood Culture (routine x 2)     Status: None (Preliminary result)   Collection Time: 11/01/16  6:55 AM  Result Value Ref Range Status   Specimen Description BLOOD RIGHT HAND  Final   Special Requests BOTTLES DRAWN AEROBIC AND ANAEROBIC 6CC  Final   Culture NO GROWTH 1 DAY  Final   Report Status PENDING  Incomplete  Blood Culture (routine x 2)     Status: None (Preliminary result)   Collection Time: 11/01/16  7:01 AM  Result Value Ref Range Status   Specimen Description BLOOD RIGHT HAND  Final   Special Requests BOTTLES DRAWN AEROBIC ONLY 4CC  Final   Culture NO GROWTH 1 DAY  Final   Report Status PENDING  Incomplete         Radiology Studies: Dg Chest Port 1 View  Result Date: 11/02/2016 CLINICAL DATA:  PICC line placement. EXAM: PORTABLE CHEST 1 VIEW COMPARISON:  Radiograph of November 01, 2016. FINDINGS: Stable cardiomegaly. Atherosclerosis of thoracic aorta is noted. Central pulmonary vascular congestion is noted. No pneumothorax or significant pleural effusion is noted. Probable mild bibasilar subsegmental atelectasis is noted. Bony thorax is unremarkable. Interval placement of right internal jugular catheter with distal tip in expected position of the SVC. IMPRESSION: Cardiomegaly. Aortic atherosclerosis. Stable central pulmonary vascular congestion. Probable  bibasilar subsegmental atelectasis. Interval placement of right internal jugular catheter with distal tip in expected position of SVC. Electronically Signed   By: Lupita Raider, M.D.   On: 11/02/2016 11:41        Scheduled Meds: . cinacalcet  30 mg Oral Q supper  . diltiazem  30 mg Oral Q6H  . doxycycline  100 mg Oral Q12H  . guaiFENesin  600 mg Oral BID  . multivitamin  1 tablet Oral Daily  . pantoprazole  40 mg Oral Daily  . pentoxifylline  400 mg Oral QAC lunch  . sevelamer carbonate  2,400 mg Oral TID WC  . simvastatin  10 mg Oral QHS  . warfarin   Does not apply Once  . Warfarin - Pharmacist Dosing Inpatient   Does not apply q1800   Continuous Infusions: . diltiazem (CARDIZEM) infusion Stopped (11/03/16 1336)  . heparin 1,000 Units/hr (11/03/16 0940)     LOS: 2 days    Time spent: 35 minutes   Brihany Butch, MD [facility] Pager [phone]  If 7PM-7AM, please contact night-coverage [email or web address] Password [email or web address] 11/03/2016, 7:17 PM

## 2016-11-04 LAB — RENAL FUNCTION PANEL
ANION GAP: 10 (ref 5–15)
Albumin: 2.9 g/dL — ABNORMAL LOW (ref 3.5–5.0)
Albumin: 2.8 g/dL — ABNORMAL LOW (ref 3.5–5.0)
Anion gap: 12 (ref 5–15)
BUN: 51 mg/dL — ABNORMAL HIGH (ref 6–20)
BUN: 60 mg/dL — AB (ref 6–20)
CHLORIDE: 93 mmol/L — AB (ref 101–111)
CHLORIDE: 90 mmol/L — AB (ref 101–111)
CO2: 28 mmol/L (ref 22–32)
CO2: 28 mmol/L (ref 22–32)
CREATININE: 7.26 mg/dL — AB (ref 0.44–1.00)
Calcium: 8.3 mg/dL — ABNORMAL LOW (ref 8.9–10.3)
Calcium: 8.6 mg/dL — ABNORMAL LOW (ref 8.9–10.3)
Creatinine, Ser: 8.26 mg/dL — ABNORMAL HIGH (ref 0.44–1.00)
GFR calc Af Amer: 5 mL/min — ABNORMAL LOW (ref >60)
GFR calc non Af Amer: 5 mL/min — ABNORMAL LOW (ref >60)
GFR calc non Af Amer: 4 mL/min — ABNORMAL LOW (ref >60)
GFR, EST AFRICAN AMERICAN: 5 mL/min — AB (ref >60)
GLUCOSE: 115 mg/dL — AB (ref 65–99)
Glucose, Bld: 137 mg/dL — ABNORMAL HIGH (ref 65–99)
POTASSIUM: 4.4 mmol/L (ref 3.5–5.1)
POTASSIUM: 4.7 mmol/L (ref 3.5–5.1)
Phosphorus: 5.1 mg/dL — ABNORMAL HIGH (ref 2.5–4.6)
Phosphorus: 5.5 mg/dL — ABNORMAL HIGH (ref 2.5–4.6)
Sodium: 131 mmol/L — ABNORMAL LOW (ref 135–145)
Sodium: 130 mmol/L — ABNORMAL LOW (ref 135–145)

## 2016-11-04 LAB — CBC
HEMATOCRIT: 31.2 % — AB (ref 36.0–46.0)
HEMATOCRIT: 31.1 % — AB (ref 36.0–46.0)
Hemoglobin: 9.7 g/dL — ABNORMAL LOW (ref 12.0–15.0)
Hemoglobin: 10 g/dL — ABNORMAL LOW (ref 12.0–15.0)
MCH: 30.3 pg (ref 26.0–34.0)
MCH: 30.8 pg (ref 26.0–34.0)
MCHC: 31.1 g/dL (ref 30.0–36.0)
MCHC: 32.2 g/dL (ref 30.0–36.0)
MCV: 97.5 fL (ref 78.0–100.0)
MCV: 95.7 fL (ref 78.0–100.0)
Platelets: 291 10*3/uL (ref 150–400)
Platelets: 303 10*3/uL (ref 150–400)
RBC: 3.2 MIL/uL — ABNORMAL LOW (ref 3.87–5.11)
RBC: 3.25 MIL/uL — ABNORMAL LOW (ref 3.87–5.11)
RDW: 13.7 % (ref 11.5–15.5)
RDW: 13.6 % (ref 11.5–15.5)
WBC: 11.6 10*3/uL — ABNORMAL HIGH (ref 4.0–10.5)
WBC: 12.9 10*3/uL — AB (ref 4.0–10.5)

## 2016-11-04 LAB — PROTIME-INR
INR: 1.41
Prothrombin Time: 17.4 seconds — ABNORMAL HIGH (ref 11.4–15.2)

## 2016-11-04 LAB — HEPARIN LEVEL (UNFRACTIONATED)
Heparin Unfractionated: 0.22 IU/mL — ABNORMAL LOW (ref 0.30–0.70)
Heparin Unfractionated: 1.68 IU/mL — ABNORMAL HIGH (ref 0.30–0.70)

## 2016-11-04 MED ORDER — ALTEPLASE 2 MG IJ SOLR
2.0000 mg | Freq: Once | INTRAMUSCULAR | Status: DC | PRN
  Filled 2016-11-04: qty 2

## 2016-11-04 MED ORDER — SODIUM CHLORIDE 0.9 % IV SOLN: 100.0000 mL | INTRAVENOUS | Status: DC | PRN

## 2016-11-04 MED ORDER — DILTIAZEM HCL 60 MG PO TABS
60.0000 mg | ORAL_TABLET | Freq: Four times a day (QID) | ORAL | Status: DC
  Administered 2016-11-04 – 2016-11-06 (×8): 60 mg via ORAL
  Filled 2016-11-04 (×8): qty 1

## 2016-11-04 MED ORDER — LIDOCAINE HCL (PF) 1 % IJ SOLN: 5.0000 mL | INTRAMUSCULAR | Status: DC | PRN

## 2016-11-04 MED ORDER — HEPARIN SODIUM (PORCINE) 1000 UNIT/ML DIALYSIS
1000.0000 [IU] | INTRAMUSCULAR | Status: DC | PRN
  Filled 2016-11-04: qty 1

## 2016-11-04 MED ORDER — WARFARIN SODIUM 5 MG PO TABS
6.0000 mg | ORAL_TABLET | Freq: Once | ORAL | Status: AC
  Administered 2016-11-04: 18:00:00 6 mg via ORAL
  Filled 2016-11-04: qty 1

## 2016-11-04 MED ORDER — ACETAMINOPHEN 325 MG PO TABS
650.0000 mg | ORAL_TABLET | Freq: Once | ORAL | Status: AC
  Administered 2016-11-04: 650 mg via ORAL
  Filled 2016-11-04: qty 2

## 2016-11-04 NOTE — Progress Notes (Signed)
PROGRESS NOTE    [name]  ZOX:[id number] DOB: Oct 27, 1929 DOA: 11/01/2016 PCP: [name]., MD   Brief Narrative: Patricia Ayala is a 83 y.o. female with medical history significant of ESRD on HD TTS, anemia, hypertension, recently discharged from the hospital on 1/2 after being treated for fluid overload from missing HD, presents to day with sob, associated with cough, myalgias, hypoxic on arrival.  Assessment & Plan:   Active Problems:   Anemia   ESRD (end stage renal disease) (HCC)   Benign essential HTN   URI (upper respiratory infection)   Atrial fibrillation with RVR (HCC)   Acute respiratory failure (HCC)   Acute respiratory failure with hypoxia:  Unclear etiology, suspect fluid overload with some bronchitis.  Currently on doxycycline for bronchitis. She might need broad coverage if continues to have fevers. So far cultures have been negative. Added on robitussin for cough.  Wean her off the oxygen as needed.  Leukocytosis improving. Afebrile overnight.  Transfer patient out of step down to telemetry.    Normocytic anemia:  Hemoglobin stable around 10.    ESRD on HD: TTS. Further management as per renal HD tomorrow.   Hypertension; well controlled.    New onset afib with RVR:  Rate better controlled. Minimally elevated troponins. BNP is 1962.  Echocardiogram on 11/17 shows  Wall thickness was increased in a pattern of severe LVH. Systolic function was   normal. The estimated ejection fraction was in the range of 55%   to 60%. Wall motion was normal; there were no regional wall   motion abnormalities. Doppler parameters are consistent with   restrictive physiology, indicative of decreased left ventricular   diastolic compliance and/or increased left atrial pressure.   Doppler parameters are consistent with high ventricular filling   pressure.  Started her on cardizem gtt, still in afib/ aflutter with rates in 80 to 90's.   Started her on IV heparin  for anticoagulation, coumadin started . Check INR and dose coumadin as per pharmacy.   Cardiology consulted for recommendations.  She was started on oral cardizem 30 every 6 hours, rate is slightly high today, increased it to 60 mg every 6 hours.   Repeat CXR shows Stable central pulmonary vascular congestion. Probable bibasilar subsegmental atelectasis. Interval placement of right internal jugular catheter with distal tip in expected position of SVC. No evidence of pneumonia or infiltrates.   Transfer her out of stepdown and start PT for generalized weakness.         DVT prophylaxis: IV heparin) Code Status: (Full) Family Communication: husband at bedside.  Disposition Plan: pending further eval. PT eval. Transfer to telemetry.    Consultants:   Cardiology  Nephrology.    Procedures: none.    Antimicrobials: doxycycline.    Subjective: Wants to get out of bed.  Afebrile overnight.  Cough and breathing better. Weaning off oxygen.   Objective: Vitals:   11/04/16 0745 11/04/16 0800 11/04/16 1123 11/04/16 1222  BP:  111/72  122/64  Pulse: (!) 102 87 (!) 119 (!) 103  Resp: Temp: 98.3 F (36.8 C)  98.5 F (36.9 C)   TempSrc: Oral  Oral   SpO2: 91% 93% 96% 97%  Weight:      Height:        Intake/Output Summary (Last 24 hours) at 11/04/16 1336 Last data filed at 11/04/16 1200  Gross per 24 hour  Intake  657.03 ml  Output                0 ml  Net           657.03 ml   Filed Weights   11/01/16 1310 11/03/16 0400 11/04/16 0449  Weight: 74.4 kg (164 lb 0.4 oz) 72.6 kg (160 lb 0.9 oz) 73 kg (160 lb 15 oz)    Examination:  General exam: Appears calm and comfortable on 2 lit of [place] oxygen.  Respiratory system: scattered wheezing. Air entry fair.  Cardiovascular system: S1 & S2 heard, RRR. No JVD, murmurs, rubs, gallops or clicks.  Gastrointestinal system: Abdomen is nondistended, soft and nontender. No organomegaly or masses felt.  Normal bowel sounds heard. Central nervous system: Alert and oriented. No focal neurological deficits. Extremities: trace edema.  Skin: No rashes, lesions or ulcers Psychiatry: Judgement and insight appear normal. Mood & affect appropriate.     Data Reviewed: I have personally reviewed following labs and imaging studies  CBC:  Recent Labs Lab 10/29/16 0740  10/30/16 0604 11/01/16 0655 11/02/16 0515 11/03/16 0440 11/04/16 0515  WBC 12.8*  < > 11.4* 12.6* 12.5* 11.1* 11.6*  NEUTROABS 10.1*  --   --  10.5*  --   --   --   HGB 10.6*  < > 10.8* 10.6* 10.0* 9.9* 9.7*  HCT 33.8*  < > 34.0* 33.8* 32.9* 32.2* 31.2*  MCV 97.4  < > 96.6 96.0 99.1 100.0 97.5  PLT 247  < > 223 216 242 264 291  < > = values in this interval not displayed. Basic Metabolic Panel:  Recent Labs Lab 10/29/16 0740 10/29/16 1923 10/30/16 0604 11/01/16 0655 11/02/16 0521 11/04/16 0515  NA 141  --  137 133* 132* 131*  K 4.4  --  4.0 4.2 4.1 4.4  CL 105  --  98* 93* 93* 93*  CO2 27  --  27 26 29 28   GLUCOSE 126*  --  122* 137* 144* 137*  BUN 57*  --  35* 55* 39* 51*  CREATININE 9.92* 5.23* 6.49* 7.72* 6.06* 7.26*  CALCIUM 9.3  --  8.9 9.2 8.2* 8.3*  PHOS 5.6*  --   --   --  4.4 5.1*   GFR: Estimated Creatinine Clearance: 5.5 mL/min (by C-G formula based on SCr of 7.26 mg/dL (H)). Liver Function Tests:  Recent Labs Lab 10/29/16 0740 11/01/16 0655 11/02/16 0521 11/04/16 0515  AST  --  28  --   --   ALT  --  33  --   --   ALKPHOS  --  66  --   --   BILITOT  --  1.3*  --   --   PROT  --  7.0  --   --   ALBUMIN 3.7 3.3* 3.0* 2.9*   No results for input(s): LIPASE, AMYLASE in the last 168 hours. No results for input(s): AMMONIA in the last 168 hours. Coagulation Profile:  Recent Labs Lab 11/02/16 0515 11/03/16 0440 11/04/16 0515  INR 1.13 1.11 1.41   Cardiac Enzymes:  Recent Labs Lab 10/29/16 0740 11/01/16 0655 11/01/16 1110 11/01/16 2340 11/02/16 0515  TROPONINI 0.05* 0.07*  0.06* 0.06* 0.05*   BNP (last 3 results) No results for input(s): PROBNP in the last 8760 hours. HbA1C: No results for input(s): HGBA1C in the last 72 hours. CBG: No results for input(s): GLUCAP in the last 168 hours. Lipid Profile: No results for input(s): CHOL, HDL, LDLCALC, TRIG, CHOLHDL, LDLDIRECT  in the last 72 hours. Thyroid Function Tests: No results for input(s): TSH, T4TOTAL, FREET4, T3FREE, THYROIDAB in the last 72 hours. Anemia Panel: No results for input(s): VITAMINB12, FOLATE, FERRITIN, TIBC, IRON, RETICCTPCT in the last 72 hours. Sepsis Labs:  Recent Labs Lab 11/01/16 4540  LATICACIDVEN 1.50    Recent Results (from the past 240 hour(s))  MRSA PCR Screening     Status: None   Collection Time: 10/29/16 12:28 PM  Result Value Ref Range Status   MRSA by PCR NEGATIVE NEGATIVE Final    Comment:        The GeneXpert MRSA Assay (FDA approved for NASAL specimens only), is one component of a comprehensive MRSA colonization surveillance program. It is not intended to diagnose MRSA infection nor to guide or monitor treatment for MRSA infections.   Blood Culture (routine x 2)     Status: None (Preliminary result)   Collection Time: 11/01/16  6:55 AM  Result Value Ref Range Status   Specimen Description BLOOD RIGHT HAND  Final   Special Requests BOTTLES DRAWN AEROBIC AND ANAEROBIC 6CC  Final   Culture NO GROWTH 3 DAYS  Final   Report Status PENDING  Incomplete  Blood Culture (routine x 2)     Status: None (Preliminary result)   Collection Time: 11/01/16  7:01 AM  Result Value Ref Range Status   Specimen Description BLOOD RIGHT HAND  Final   Special Requests BOTTLES DRAWN AEROBIC ONLY 4CC  Final   Culture NO GROWTH 3 DAYS  Final   Report Status PENDING  Incomplete         Radiology Studies: No results found.      Scheduled Meds: . cinacalcet  30 mg Oral Q supper  . diltiazem  60 mg Oral Q6H  . doxycycline  100 mg Oral Q12H  . guaiFENesin  600 mg  Oral BID  . multivitamin  1 tablet Oral Daily  . pantoprazole  40 mg Oral Daily  . pentoxifylline  400 mg Oral QAC lunch  . sevelamer carbonate  2,400 mg Oral TID WC  . simvastatin  10 mg Oral QHS  . warfarin  6 mg Oral Once  . warfarin   Does not apply Once  . Warfarin - Pharmacist Dosing Inpatient   Does not apply q1800   Continuous Infusions: . heparin 1,100 Units/hr (11/04/16 1056)     LOS: 3 days    Time spent: 35 minutes   Jairen Goldfarb, MD [facility] Pager [phone]  If 7PM-7AM, please contact night-coverage [email or web address] Password [email or web address] 11/04/2016, 1:36 PM

## 2016-11-04 NOTE — Progress Notes (Signed)
Subjective: Interval History: Patient says that she's feeling much better. Presently she denies any difficulty breathing. Her appetite is good. Objective: Vital signs in last 24 hours: Temp:  [98.3 F (36.8 C)-99.2 F (37.3 C)] 98.3 F (36.8 C) (01/07 0745) Pulse Rate:  [77-102] 87 (01/07 0800) Resp:  [14-20] 20 (01/07 0800) BP: (103-111)/(67-76) 111/72 (01/07 0800) SpO2:  [91 %-100 %] 93 % (01/07 0800) Weight:  [73 kg (160 lb 15 oz)] 73 kg (160 lb 15 oz) (01/07 0449) Weight change: 0.4 kg (14.1 oz)  Intake/Output from previous day: 01/06 0701 - 01/07 0700 In: 768 [P.O.:360; I.V.:408] Out: -  Intake/Output this shift: No intake/output data recorded.  General appearance: alert, cooperative and no distress Resp: diminished breath sounds bilaterally Cardio: irregularly irregular rhythm Extremities: No edema  Lab Results:  Recent Labs  11/03/16 0440 11/04/16 0515  WBC 11.1* 11.6*  HGB 9.9* 9.7*  HCT 32.2* 31.2*  PLT 264 291   BMET:   Recent Labs  11/02/16 0521 11/04/16 0515  NA 132* 131*  K 4.1 4.4  CL 93* 93*  CO2 29 28  GLUCOSE 144* 137*  BUN 39* 51*  CREATININE 6.06* 7.26*  CALCIUM 8.2* 8.3*   No results for input(s): PTH in the last 72 hours. Iron Studies: No results for input(s): IRON, TIBC, TRANSFERRIN, FERRITIN in the last 72 hours.  Studies/Results: Dg Chest Port 1 View  Result Date: 11/02/2016 CLINICAL DATA:  PICC line placement. EXAM: PORTABLE CHEST 1 VIEW COMPARISON:  Radiograph of November 01, 2016. FINDINGS: Stable cardiomegaly. Atherosclerosis of thoracic aorta is noted. Central pulmonary vascular congestion is noted. No pneumothorax or significant pleural effusion is noted. Probable mild bibasilar subsegmental atelectasis is noted. Bony thorax is unremarkable. Interval placement of right internal jugular catheter with distal tip in expected position of the SVC. IMPRESSION: Cardiomegaly. Aortic atherosclerosis. Stable central pulmonary vascular  congestion. Probable bibasilar subsegmental atelectasis. Interval placement of right internal jugular catheter with distal tip in expected position of SVC. Electronically Signed   By: Lupita Raider, M.D.   On: 11/02/2016 11:41    I have reviewed the patient's current medications.  Assessment/Plan  Problem #1 a trial fibrillation: Heart rate is controlled. Patient on Cardizem  Problem #2 end-stage renal disease: Her potassium is normal. She doesn't have any uremic sign and symptoms. Problem #3. Anemia: Her hemoglobin is low. She is on Epogen Problem #4 metabolic bone disease: Her calcium and her phosphorus is in range. Patient is  on a binder Renvela Problem #5 hypertension: Her blood pressure is reasonably controlled Problem #6 difficulty in breathing: Patient is presently asymptomatic. No signs of fluid over load  Plan: 1]Patient does require dialysis today 2] to make arrangements for patient to get dialysis tomorrow for 4 hours 3] we'll try to remove about 3 L if her blood pressure tolerates. 4]We'll check renal panel and CBC in the morning.   LOS: 3 days   Lashawnna Lambrecht S 11/04/2016,9:22 AM

## 2016-11-04 NOTE — Progress Notes (Signed)
ANTICOAGULATION CONSULT NOTE  Pharmacy Consult for Heparin Indication: atrial fibrillation  No Known Allergies  Patient Measurements: Height: 5\' 4"  (162.6 cm) Weight: 160 lb 15 oz (73 kg) IBW/kg (Calculated) : 54.7 HEPARIN DW (KG): 70.2  Vital Signs: Temp: 98.5 F (36.9 C) (01/07 1123) Temp Source: Oral (01/07 1123) BP: 126/71 (01/07 1300) Pulse Rate: 93 (01/07 1300)  Labs:  Recent Labs  11/01/16 2340  11/02/16 0515 11/02/16 0521  11/03/16 0440 11/04/16 0515 11/04/16 1626  HGB  --   < > 10.0*  --   --  9.9* 9.7* 10.0*  HCT  --   < > 32.9*  --   --  32.2* 31.2* 31.1*  PLT  --   < > 242  --   --  264 291 303  LABPROT  --   --  14.5  --   --  14.3 17.4*  --   INR  --   --  1.13  --   --  1.11 1.41  --   HEPARINUNFRC  --   --   --   --   < > 0.43 0.22* 1.68*  CREATININE  --   --   --  6.06*  --   --  7.26* 8.26*  TROPONINI 0.06*  --  0.05*  --   --   --   --   --   < > = values in this interval not displayed.  Estimated Creatinine Clearance: 4.9 mL/min (by C-G formula based on SCr of 8.26 mg/dL (H)).   Medical History: Past Medical History:  Diagnosis Date  . Anemia 02/11/2012  . Anxiety   . Chronic kidney disease    not on dialysis yet, Tues, thurs, sat  . H/O metabolic acidosis   . Hyperparathyroidism, secondary (HCC)   . Hypertension    Dr. Regino Schultze, [place] [facility], [place]  . Vitamin D deficiency     Medications:  Prescriptions Prior to Admission  Medication Sig Dispense Refill Last Dose  . albuterol (PROVENTIL HFA;VENTOLIN HFA) 108 (90 Base) MCG/ACT inhaler Inhale 2 puffs into the lungs every 6 (six) hours as needed for wheezing or shortness of breath. 1 Inhaler 2 unknown  . ALPRAZolam (XANAX) 0.5 MG tablet Take 1 tablet by mouth 3 (three) times daily as needed for anxiety.   Past Week at Unknown time  . amLODipine (NORVASC) 10 MG tablet Take 1 tablet (10 mg total) by mouth daily. 30 tablet 2 11/01/2016 at Unknown time  . BIOTIN PO Take 1,000 mg by mouth daily.     10/31/2016 at Unknown time  . cinacalcet (SENSIPAR) 30 MG tablet Take 30 mg by mouth at bedtime.   10/31/2016 at Unknown time  . guaiFENesin (MUCINEX) 600 MG 12 hr tablet Take 1 tablet (600 mg total) by mouth 2 (two) times daily. 30 tablet 0 10/31/2016 at Unknown time  . labetalol (NORMODYNE) 200 MG tablet Take 200 mg by mouth 2 (two) times daily.    10/31/2016 at Unknown time  . lidocaine-prilocaine (EMLA) cream Apply 1 application topically as needed (for IV access).    unknown  . multivitamin (RENA-VIT) TABS tablet Take 1 tablet by mouth daily.   10/31/2016 at Unknown time  . pantoprazole (PROTONIX) 40 MG tablet Take 1 tablet by mouth daily.   10/31/2016 at Unknown time  . pentoxifylline (TRENTAL) 400 MG CR tablet Take 400 mg by mouth 3 (three) times daily with meals.   10/31/2016 at Unknown time  . promethazine (PHENERGAN) 25 MG tablet  Take 25 mg by mouth every 6 (six) hours as needed for nausea or vomiting.   unknown  . sevelamer carbonate (RENVELA) 800 MG tablet Take 2,400 mg by mouth 3 (three) times daily with meals. Take 800mg  with snacks daily.   10/31/2016 at Unknown time  . simvastatin (ZOCOR) 10 MG tablet Take 10 mg by mouth at bedtime.    10/31/2016 at Unknown time    Assessment: 83 y.o. female with medical history significant of ESRD on HD TTS, anemia, hypertension, recently discharged from the hospital on 1/2 after being treated for fluid overload. On arrival to ED, she was found to be in Afib with RVR, new onset. Was given lovenox 1mg /kg and plan to continue anticoagulation with Heparin(no bolus per MD). Coumadin started 1/6.  Heparin level this afternoon is elevated.  Level was drawn from PICC line as pt refusing lab stick.  Heparin level was trending down on 1000 units/hr, rate increased to 1100 units/hr.  Level is not accurate.  I think it is reasonable to continue heparin at this rate and f/u level in am  Goal of Therapy:  INR 2-3 Heparin level 0.3-0.7 units/ml Monitor platelets by  anticoagulation protocol: Yes   Plan:  Cont heparin infusion at 1100 units/hr Check labs in am Continue to monitor H&H and platelets  Thanks for allowing pharmacy to be a part of this patient's care.  Talbert Cage, PharmD Clinical Pharmacist 11/04/2016,5:38 PM

## 2016-11-04 NOTE — Progress Notes (Signed)
ANTICOAGULATION CONSULT NOTE  Pharmacy Consult for Heparin, coumadin Indication: atrial fibrillation  No Known Allergies  Patient Measurements: Height: 5\' 4"  (162.6 cm) Weight: 160 lb 15 oz (73 kg) IBW/kg (Calculated) : 54.7 HEPARIN DW (KG): 70.2  Vital Signs: Temp: 98.3 F (36.8 C) (01/07 0745) Temp Source: Oral (01/07 0745) BP: 111/72 (01/07 0800) Pulse Rate: 87 (01/07 0800)  Labs:  Recent Labs  11/01/16 1110 11/01/16 2340  11/02/16 0515 11/02/16 0521 11/02/16 2031 11/03/16 0440 11/04/16 0515  HGB  --   --   < > 10.0*  --   --  9.9* 9.7*  HCT  --   --   --  32.9*  --   --  32.2* 31.2*  PLT  --   --   --  242  --   --  264 291  LABPROT  --   --   --  14.5  --   --  14.3 17.4*  INR  --   --   --  1.13  --   --  1.11 1.41  HEPARINUNFRC  --   --   --   --   --  0.54 0.43 0.22*  CREATININE  --   --   --   --  6.06*  --   --  7.26*  TROPONINI 0.06* 0.06*  --  0.05*  --   --   --   --   < > = values in this interval not displayed.  Estimated Creatinine Clearance: 5.5 mL/min (by C-G formula based on SCr of 7.26 mg/dL (H)).   Medical History: Past Medical History:  Diagnosis Date  . Anemia 02/11/2012  . Anxiety   . Chronic kidney disease    not on dialysis yet, Tues, thurs, sat  . H/O metabolic acidosis   . Hyperparathyroidism, secondary (HCC)   . Hypertension    Dr. Regino Schultze, [facility], [place]  . Vitamin D deficiency     Medications:  Prescriptions Prior to Admission  Medication Sig Dispense Refill Last Dose  . albuterol (PROVENTIL HFA;VENTOLIN HFA) 108 (90 Base) MCG/ACT inhaler Inhale 2 puffs into the lungs every 6 (six) hours as needed for wheezing or shortness of breath. 1 Inhaler 2 unknown  . ALPRAZolam (XANAX) 0.5 MG tablet Take 1 tablet by mouth 3 (three) times daily as needed for anxiety.   Past Week at Unknown time  . amLODipine (NORVASC) 10 MG tablet Take 1 tablet (10 mg total) by mouth daily. 30 tablet 2 11/01/2016 at Unknown time  . BIOTIN PO Take 1,000  mg by mouth daily.    10/31/2016 at Unknown time  . cinacalcet (SENSIPAR) 30 MG tablet Take 30 mg by mouth at bedtime.   10/31/2016 at Unknown time  . guaiFENesin (MUCINEX) 600 MG 12 hr tablet Take 1 tablet (600 mg total) by mouth 2 (two) times daily. 30 tablet 0 10/31/2016 at Unknown time  . labetalol (NORMODYNE) 200 MG tablet Take 200 mg by mouth 2 (two) times daily.    10/31/2016 at Unknown time  . lidocaine-prilocaine (EMLA) cream Apply 1 application topically as needed (for IV access).    unknown  . multivitamin (RENA-VIT) TABS tablet Take 1 tablet by mouth daily.   10/31/2016 at Unknown time  . pantoprazole (PROTONIX) 40 MG tablet Take 1 tablet by mouth daily.   10/31/2016 at Unknown time  . pentoxifylline (TRENTAL) 400 MG CR tablet Take 400 mg by mouth 3 (three) times daily with meals.   10/31/2016 at Unknown  time  . promethazine (PHENERGAN) 25 MG tablet Take 25 mg by mouth every 6 (six) hours as needed for nausea or vomiting.   unknown  . sevelamer carbonate (RENVELA) 800 MG tablet Take 2,400 mg by mouth 3 (three) times daily with meals. Take 800mg  with snacks daily.   10/31/2016 at Unknown time  . simvastatin (ZOCOR) 10 MG tablet Take 10 mg by mouth at bedtime.    10/31/2016 at Unknown time    Assessment: 83 y.o. female with medical history significant of ESRD on HD TTS, anemia, hypertension, recently discharged from the hospital on 1/2 after being treated for fluid overload. On arrival to ED, she was found to be in Afib with RVR, new onset. Was given lovenox 1mg /kg and plan to continue anticoagulation with Heparin(no bolus per MD). Coumadin started 1/6.  Heparin level subtherapeutic this am  Goal of Therapy:  INR 2-3 Heparin level 0.3-0.7 units/ml Monitor platelets by anticoagulation protocol: Yes   Plan:  Increase heparin infusion to 1100 units/hr Check anti-Xa level later today Continue to monitor H&H and platelets Coumadin 6 mg po today  Thanks for allowing pharmacy to be a part of this  patient's care.  Talbert Cage, PharmD Clinical Pharmacist 11/04/2016,9:04 AM

## 2016-11-05 ENCOUNTER — Inpatient Hospital Stay (HOSPITAL_COMMUNITY): Payer: Medicare Other

## 2016-11-05 LAB — RENAL FUNCTION PANEL
ANION GAP: 14 (ref 5–15)
Albumin: 2.6 g/dL — ABNORMAL LOW (ref 3.5–5.0)
BUN: 73 mg/dL — AB (ref 6–20)
CO2: 26 mmol/L (ref 22–32)
CREATININE: 9.25 mg/dL — AB (ref 0.44–1.00)
Calcium: 8.3 mg/dL — ABNORMAL LOW (ref 8.9–10.3)
Chloride: 90 mmol/L — ABNORMAL LOW (ref 101–111)
GFR calc Af Amer: 4 mL/min — ABNORMAL LOW (ref >60)
GFR calc non Af Amer: 3 mL/min — ABNORMAL LOW (ref >60)
GLUCOSE: 97 mg/dL (ref 65–99)
Phosphorus: 6.5 mg/dL — ABNORMAL HIGH (ref 2.5–4.6)
Potassium: 5 mmol/L (ref 3.5–5.1)
SODIUM: 130 mmol/L — AB (ref 135–145)

## 2016-11-05 LAB — CBC
HCT: 30.7 % — ABNORMAL LOW (ref 36.0–46.0)
Hemoglobin: 9.9 g/dL — ABNORMAL LOW (ref 12.0–15.0)
MCH: 30.6 pg (ref 26.0–34.0)
MCHC: 32.2 g/dL (ref 30.0–36.0)
MCV: 94.8 fL (ref 78.0–100.0)
PLATELETS: 318 10*3/uL (ref 150–400)
RBC: 3.24 MIL/uL — ABNORMAL LOW (ref 3.87–5.11)
RDW: 13.7 % (ref 11.5–15.5)
WBC: 13.2 10*3/uL — ABNORMAL HIGH (ref 4.0–10.5)

## 2016-11-05 LAB — HEPARIN LEVEL (UNFRACTIONATED): Heparin Unfractionated: 0.52 IU/mL (ref 0.30–0.70)

## 2016-11-05 LAB — PROTIME-INR
INR: 2.16
Prothrombin Time: 24.4 seconds — ABNORMAL HIGH (ref 11.4–15.2)

## 2016-11-05 MED ORDER — WARFARIN SODIUM 2 MG PO TABS
2.0000 mg | ORAL_TABLET | Freq: Once | ORAL | Status: AC
  Administered 2016-11-05: 2 mg via ORAL
  Filled 2016-11-05: qty 1

## 2016-11-05 MED ORDER — EPOETIN ALFA 4000 UNIT/ML IJ SOLN
INTRAMUSCULAR | Status: AC
  Filled 2016-11-05: qty 1

## 2016-11-05 MED ORDER — EPOETIN ALFA 4000 UNIT/ML IJ SOLN
3000.0000 [IU] | Freq: Once | INTRAMUSCULAR | Status: AC
Start: 2016-11-05 — End: 2016-11-05
  Administered 2016-11-05: 3000 [IU] via INTRAVENOUS

## 2016-11-05 NOTE — Progress Notes (Signed)
11/05/16  0950  Heparin level 0.52  Continue at the same rate per order if level is between 0.3-0.7

## 2016-11-05 NOTE — Progress Notes (Signed)
11/05/16  1420  Notified Dr Blake Divine that patient is c/o bilateral leg and foot pain. Pt husband states patient is scheduled to see the vein specialist on this upcoming Friday for scans. But is requesting that the scans be done while patient is here in the hospital.

## 2016-11-05 NOTE — Progress Notes (Signed)
PROGRESS NOTE    Patricia Ayala  WUJ:[id number] DOB: 04-05-1929 DOA: 11/01/2016 PCP: [name]., MD   Brief Narrative: Patricia Ayala is a 83 y.o. female with medical history significant of ESRD on HD TTS, anemia, hypertension, recently discharged from the hospital on 1/2 after being treated for fluid overload from missing HD, presents to day with sob, associated with cough, myalgias, hypoxic on arrival.  Assessment & Plan:   Active Problems:   Anemia   ESRD (end stage renal disease) (HCC)   Benign essential HTN   URI (upper respiratory infection)   Atrial fibrillation with RVR (HCC)   Acute respiratory failure (HCC)   Acute respiratory failure with hypoxia:  Unclear etiology, suspect fluid overload with some bronchitis.  Currently on doxycycline for bronchitis. She might need broad coverage if continues to have fevers. So far cultures have been negative. Added on robitussin for cough. No fevers over the last 48 hours.  Wean her off the oxygen as appropriate.     Normocytic anemia:  Hemoglobin stable around 10.    ESRD on HD: TTS. Further management as per renal HD today.   Hypertension; well controlled.    New onset afib with RVR:  Rate better controlled. Minimally elevated troponins. BNP is 1962.  Echocardiogram on 11/17 shows  Wall thickness was increased in a pattern of severe LVH. Systolic function was   normal. The estimated ejection fraction was in the range of 55%   to 60%. Wall motion was normal; there were no regional wall   motion abnormalities. Doppler parameters are consistent with   restrictive physiology, indicative of decreased left ventricular   diastolic compliance and/or increased left atrial pressure.   Doppler parameters are consistent with high ventricular filling   pressure.  Started her on cardizem gtt, still in afib/ aflutter with rates in 80 to 90's.  Changed to oral cardizem , rate  Much better.  Started her on IV heparin for  anticoagulation, coumadin started . Check INR and dose coumadin as per pharmacy. Stop heparin in am as her INR is therapeutic.  Appreciate cardiology assistance.   Repeat CXR shows Stable central pulmonary vascular congestion. Probable bibasilar subsegmental atelectasis. Interval placement of right internal jugular catheter with distal tip in expected position of SVC. No evidence of pneumonia or infiltrates.     Bilateral lower extremity pain and tenderness:  Family adamant about getting US of the legs, though she is already therapeutic with coumadin.    Mild leukocytosis: Unclear etiology. Monitor.    DVT prophylaxis: IV heparin) Code Status: (Full) Family Communication: none at bedside. But discussed with husband about the tests.  Disposition Plan: pending further eval. PT eval.    Consultants:   Cardiology  Nephrology.    Procedures: none.    Antimicrobials: doxycycline.    Subjective: On 2 lit of [place] oxygen, no new complaints.   Objective: Vitals:   11/05/16 1300 11/05/16 1330 11/05/16 1400 11/05/16 1430  BP: (!) 102/50 (!) 126/47 (!) 93/53 102/62  Pulse: 100 (!) 104 92 88  Resp:      Temp:      TempSrc:      SpO2:      Weight:      Height:        Intake/Output Summary (Last 24 hours) at 11/05/16 1516 Last data filed at 11/05/16 1100  Gross per 24 hour  Intake              143 ml  Output  0 ml  Net              143 ml   Filed Weights   11/04/16 0449 11/05/16 0645 11/05/16 1045  Weight: 73 kg (160 lb 15 oz) 73.5 kg (162 lb 0.6 oz) 73.5 kg (162 lb 0.6 oz)    Examination:  General exam: Appears calm and comfortable on 2 lit of [place] oxygen.  Respiratory system: no wheezing, air entry fair.  Cardiovascular system: S1 & S2 heard, irregular,  No JVD, murmurs, rubs, gallops or clicks.  Gastrointestinal system: Abdomen is nondistended, soft and nontender. No organomegaly or masses felt. Normal bowel sounds heard. Central nervous system:  Alert and oriented. No focal neurological deficits. Extremities: trace edema.  Skin: No rashes, lesions or ulcers     Data Reviewed: I have personally reviewed following labs and imaging studies  CBC:  Recent Labs Lab 11/01/16 0655 11/02/16 0515 11/03/16 0440 11/04/16 0515 11/04/16 1626 11/05/16 0630  WBC 12.6* 12.5* 11.1* 11.6* 12.9* 13.2*  NEUTROABS 10.5*  --   --   --   --   --   HGB 10.6* 10.0* 9.9* 9.7* 10.0* 9.9*  HCT 33.8* 32.9* 32.2* 31.2* 31.1* 30.7*  MCV 96.0 99.1 100.0 97.5 95.7 94.8  PLT 216 242 264 291 303 318   Basic Metabolic Panel:  Recent Labs Lab 11/01/16 0655 11/02/16 0521 11/04/16 0515 11/04/16 1626 11/05/16 0630  NA 133* 132* 131* 130* 130*  K 4.2 4.1 4.4 4.7 5.0  CL 93* 93* 93* 90* 90*  CO2 GLUCOSE 137* 144* 137* 115* 97  BUN 55* 39* 51* 60* 73*  CREATININE 7.72* 6.06* 7.26* 8.26* 9.25*  CALCIUM 9.2 8.2* 8.3* 8.6* 8.3*  PHOS  --  4.4 5.1* 5.5* 6.5*   GFR: Estimated Creatinine Clearance: 4.4 mL/min (by C-G formula based on SCr of 9.25 mg/dL (H)). Liver Function Tests:  Recent Labs Lab 11/01/16 0655 11/02/16 0521 11/04/16 0515 11/04/16 1626 11/05/16 0630  AST 28  --   --   --   --   ALT 33  --   --   --   --   ALKPHOS 66  --   --   --   --   BILITOT 1.3*  --   --   --   --   PROT 7.0  --   --   --   --   ALBUMIN 3.3* 3.0* 2.9* 2.8* 2.6*   No results for input(s): LIPASE, AMYLASE in the last 168 hours. No results for input(s): AMMONIA in the last 168 hours. Coagulation Profile:  Recent Labs Lab 11/02/16 0515 11/03/16 0440 11/04/16 0515 11/05/16 0832  INR 1.13 1.11 1.41 2.16   Cardiac Enzymes:  Recent Labs Lab 11/01/16 0655 11/01/16 1110 11/01/16 2340 11/02/16 0515  TROPONINI 0.07* 0.06* 0.06* 0.05*   BNP (last 3 results) No results for input(s): PROBNP in the last 8760 hours. HbA1C: No results for input(s): HGBA1C in the last 72 hours. CBG: No results for input(s): GLUCAP in the last 168  hours. Lipid Profile: No results for input(s): CHOL, HDL, LDLCALC, TRIG, CHOLHDL, LDLDIRECT in the last 72 hours. Thyroid Function Tests: No results for input(s): TSH, T4TOTAL, FREET4, T3FREE, THYROIDAB in the last 72 hours. Anemia Panel: No results for input(s): VITAMINB12, FOLATE, FERRITIN, TIBC, IRON, RETICCTPCT in the last 72 hours. Sepsis Labs:  Recent Labs Lab 11/01/16 0659  LATICACIDVEN 1.50    Recent Results (from the past 240 hour(s))  MRSA PCR Screening     Status: None   Collection Time: 10/29/16 12:28 PM  Result Value Ref Range Status   MRSA by PCR NEGATIVE NEGATIVE Final    Comment:        The GeneXpert MRSA Assay (FDA approved for NASAL specimens only), is one component of a comprehensive MRSA colonization surveillance program. It is not intended to diagnose MRSA infection nor to guide or monitor treatment for MRSA infections.   Blood Culture (routine x 2)     Status: None (Preliminary result)   Collection Time: 11/01/16  6:55 AM  Result Value Ref Range Status   Specimen Description BLOOD RIGHT HAND  Final   Special Requests BOTTLES DRAWN AEROBIC AND ANAEROBIC 6CC  Final   Culture NO GROWTH 4 DAYS  Final   Report Status PENDING  Incomplete  Blood Culture (routine x 2)     Status: None (Preliminary result)   Collection Time: 11/01/16  7:01 AM  Result Value Ref Range Status   Specimen Description BLOOD RIGHT HAND  Final   Special Requests BOTTLES DRAWN AEROBIC ONLY 4CC  Final   Culture NO GROWTH 4 DAYS  Final   Report Status PENDING  Incomplete         Radiology Studies: No results found.      Scheduled Meds: . cinacalcet  30 mg Oral Q supper  . diltiazem  60 mg Oral Q6H  . doxycycline  100 mg Oral Q12H  . guaiFENesin  600 mg Oral BID  . multivitamin  1 tablet Oral Daily  . pantoprazole  40 mg Oral Daily  . pentoxifylline  400 mg Oral QAC lunch  . sevelamer carbonate  2,400 mg Oral TID WC  . simvastatin  10 mg Oral QHS  . warfarin  2 mg  Oral Once  . warfarin   Does not apply Once  . Warfarin - Pharmacist Dosing Inpatient   Does not apply q1800   Continuous Infusions: . heparin 1,100 Units/hr (11/05/16 1210)     LOS: 4 days    Time spent: 35 minutes   Patricia Niese, MD [facility] Pager [phone]  If 7PM-7AM, please contact night-coverage [email or web address] Password [email or web address] 11/05/2016, 3:16 PM

## 2016-11-05 NOTE — Progress Notes (Signed)
ANTICOAGULATION CONSULT NOTE  Pharmacy Consult for Heparin, coumadin Indication: atrial fibrillation  No Known Allergies  Patient Measurements: Height: 5\' 4"  (162.6 cm) Weight: 162 lb 0.6 oz (73.5 kg) IBW/kg (Calculated) : 54.7 HEPARIN DW (KG): 70.2  Vital Signs: Temp: 98 F (36.7 C) (01/08 0645) Temp Source: Oral (01/08 0645) BP: 111/55 (01/08 0645)  Labs:  Recent Labs  11/03/16 0440 11/04/16 0515 11/04/16 1626 11/05/16 0630 11/05/16 0832  HGB 9.9* 9.7* 10.0* 9.9*  --   HCT 32.2* 31.2* 31.1* 30.7*  --   PLT 264 291 303 318  --   LABPROT 14.3 17.4*  --   --  24.4*  INR 1.11 1.41  --   --  2.16  HEPARINUNFRC 0.43 0.22* 1.68*  --  0.52  CREATININE  --  7.26* 8.26* 9.25*  --     Estimated Creatinine Clearance: 4.4 mL/min (by C-G formula based on SCr of 9.25 mg/dL (H)).   Medical History: Past Medical History:  Diagnosis Date  . Anemia 02/11/2012  . Anxiety   . Chronic kidney disease    not on dialysis yet, Tues, thurs, sat  . H/O metabolic acidosis   . Hyperparathyroidism, secondary (HCC)   . Hypertension    Dr. Regino Schultze, [place], [place]  . Vitamin D deficiency     Medications:  Prescriptions Prior to Admission  Medication Sig Dispense Refill Last Dose  . albuterol (PROVENTIL HFA;VENTOLIN HFA) 108 (90 Base) MCG/ACT inhaler Inhale 2 puffs into the lungs every 6 (six) hours as needed for wheezing or shortness of breath. 1 Inhaler 2 unknown  . ALPRAZolam (XANAX) 0.5 MG tablet Take 1 tablet by mouth 3 (three) times daily as needed for anxiety.   Past Week at Unknown time  . amLODipine (NORVASC) 10 MG tablet Take 1 tablet (10 mg total) by mouth daily. 30 tablet 2 11/01/2016 at Unknown time  . BIOTIN PO Take 1,000 mg by mouth daily.    10/31/2016 at Unknown time  . cinacalcet (SENSIPAR) 30 MG tablet Take 30 mg by mouth at bedtime.   10/31/2016 at Unknown time  . guaiFENesin (MUCINEX) 600 MG 12 hr tablet Take 1 tablet (600 mg total) by mouth 2 (two) times daily. 30 tablet 0  10/31/2016 at Unknown time  . labetalol (NORMODYNE) 200 MG tablet Take 200 mg by mouth 2 (two) times daily.    10/31/2016 at Unknown time  . lidocaine-prilocaine (EMLA) cream Apply 1 application topically as needed (for IV access).    unknown  . multivitamin (RENA-VIT) TABS tablet Take 1 tablet by mouth daily.   10/31/2016 at Unknown time  . pantoprazole (PROTONIX) 40 MG tablet Take 1 tablet by mouth daily.   10/31/2016 at Unknown time  . pentoxifylline (TRENTAL) 400 MG CR tablet Take 400 mg by mouth 3 (three) times daily with meals.   10/31/2016 at Unknown time  . promethazine (PHENERGAN) 25 MG tablet Take 25 mg by mouth every 6 (six) hours as needed for nausea or vomiting.   unknown  . sevelamer carbonate (RENVELA) 800 MG tablet Take 2,400 mg by mouth 3 (three) times daily with meals. Take 800mg  with snacks daily.   10/31/2016 at Unknown time  . simvastatin (ZOCOR) 10 MG tablet Take 10 mg by mouth at bedtime.    10/31/2016 at Unknown time    Assessment: 83 y.o. female with medical history significant of ESRD on HD TTS, anemia, hypertension, recently discharged from the hospital on 1/2 after being treated for fluid overload. On arrival to  ED, she was found to be in Afib with RVR, new onset. Was given lovenox 1mg /kg and plan to continue anticoagulation with Heparin(no bolus per MD). Coumadin started 1/6.   Heparin level is therapeutic this am.  INR is also therapeutic 2.16, large increase in protime overnight  Goal of Therapy:  INR 2-3 Heparin level 0.3-0.7 units/ml Monitor platelets by anticoagulation protocol: Yes   Plan:  Can dc heparin today as INR therapeutic Continue to monitor H&H and platelets Coumadin 2 mg po today  Thanks for allowing pharmacy to be a part of this patient's care.  Talbert Cage, PharmD Clinical Pharmacist 11/05/2016,9:36 AM

## 2016-11-05 NOTE — Progress Notes (Signed)
PT Cancellation Note  Patient Details Name: Patricia Ayala MRN: [id number] DOB: August 11, 1929   Cancelled Treatment:     Pt in dialysis.   Virgina Organ, PT CLT [phone] 11/05/2016, 1:42 PM

## 2016-11-05 NOTE — Discharge Instructions (Signed)
Information on my medicine - Coumadin®   (Warfarin) ° °This medication education was reviewed with me or my healthcare representative as part of my discharge preparation.  The pharmacist that spoke with me during my hospital stay was:  Henrique Parekh R, RPH ° °Why was Coumadin prescribed for you? °Coumadin was prescribed for you because you have a blood clot or a medical condition that can cause an increased risk of forming blood clots. Blood clots can cause serious health problems by blocking the flow of blood to the heart, lung, or brain. Coumadin can prevent harmful blood clots from forming. °As a reminder your indication for Coumadin is:   Stroke Prevention Because Of Atrial Fibrillation ° °What test will check on my response to Coumadin? °While on Coumadin (warfarin) you will need to have an INR test regularly to ensure that your dose is keeping you in the desired range. The INR (international normalized ratio) number is calculated from the result of the laboratory test called prothrombin time (PT). ° °If an INR APPOINTMENT HAS NOT ALREADY BEEN MADE FOR YOU please schedule an appointment to have this lab work done by your health care provider within 7 days. °Your INR goal is usually a number between:  2 to 3 or your provider may give you a more narrow range like 2-2.5.  Ask your health care provider during an office visit what your goal INR is. ° °What  do you need to  know  About  COUMADIN? °Take Coumadin (warfarin) exactly as prescribed by your healthcare provider about the same time each day.  DO NOT stop taking without talking to the doctor who prescribed the medication.  Stopping without other blood clot prevention medication to take the place of Coumadin may increase your risk of developing a new clot or stroke.  Get refills before you run out. ° °What do you do if you miss a dose? °If you miss a dose, take it as soon as you remember on the same day then continue your regularly scheduled regimen the next  day.  Do not take two doses of Coumadin at the same time. ° °Important Safety Information °A possible side effect of Coumadin (Warfarin) is an increased risk of bleeding. You should call your healthcare provider right away if you experience any of the following: °  Bleeding from an injury or your nose that does not stop. °  Unusual colored urine (red or dark brown) or unusual colored stools (red or black). °  Unusual bruising for unknown reasons. °  A serious fall or if you hit your head (even if there is no bleeding). ° °Some foods or medicines interact with Coumadin® (warfarin) and might alter your response to warfarin. To help avoid this: °  Eat a balanced diet, maintaining a consistent amount of Vitamin K. °  Notify your provider about major diet changes you plan to make. °  Avoid alcohol or limit your intake to 1 drink for women and 2 drinks for men per day. °(1 drink is 5 oz. wine, 12 oz. beer, or 1.5 oz. liquor.) ° °Make sure that ANY health care provider who prescribes medication for you knows that you are taking Coumadin (warfarin).  Also make sure the healthcare provider who is monitoring your Coumadin knows when you have started a new medication including herbals and non-prescription products. ° °Coumadin® (Warfarin)  Major Drug Interactions  °Increased Warfarin Effect Decreased Warfarin Effect  °Alcohol (large quantities) °Antibiotics (esp. Septra/Bactrim, Flagyl, Cipro) °Amiodarone (Cordarone) °Aspirin (  ASA) °Cimetidine (Tagamet) °Megestrol (Megace) °NSAIDs (ibuprofen, naproxen, etc.) °Piroxicam (Feldene) °Propafenone (Rythmol SR) °Propranolol (Inderal) °Isoniazid (INH) °Posaconazole (Noxafil) Barbiturates (Phenobarbital) °Carbamazepine (Tegretol) °Chlordiazepoxide (Librium) °Cholestyramine (Questran) °Griseofulvin °Oral Contraceptives °Rifampin °Sucralfate (Carafate) °Vitamin K  ° °Coumadin® (Warfarin) Major Herbal Interactions  °Increased Warfarin Effect Decreased Warfarin Effect   °Garlic °Ginseng °Ginkgo biloba Coenzyme Q10 °Green tea °St. John’s wort   ° °Coumadin® (Warfarin) FOOD Interactions  °Eat a consistent number of servings per week of foods HIGH in Vitamin K °(1 serving = ½ cup)  °Collards (cooked, or boiled & drained) °Kale (cooked, or boiled & drained) °Mustard greens (cooked, or boiled & drained) °Parsley *serving size only = ¼ cup °Spinach (cooked, or boiled & drained) °Swiss chard (cooked, or boiled & drained) °Turnip greens (cooked, or boiled & drained)  °Eat a consistent number of servings per week of foods MEDIUM-HIGH in Vitamin K °(1 serving = 1 cup)  °Asparagus (cooked, or boiled & drained) °Broccoli (cooked, boiled & drained, or raw & chopped) °Brussel sprouts (cooked, or boiled & drained) *serving size only = ½ cup °Lettuce, raw (green leaf, endive, romaine) °Spinach, raw °Turnip greens, raw & chopped  ° °These websites have more information on Coumadin (warfarin):  www.coumadin.com; °www.ahrq.gov/consumer/coumadin.htm; ° ° ° °

## 2016-11-05 NOTE — Progress Notes (Signed)
Subjective: Interval History: Patient complains of some weakness otherwise feels okay. She denies any difficulty breathing. Objective: Vital signs in last 24 hours: Temp:  [98 F (36.7 C)-98.5 F (36.9 C)] 98 F (36.7 C) (01/08 0645) Pulse Rate:  [75-119] 75 (01/07 2112) Resp:  [16-17] 17 (01/08 0645) BP: (111-126)/(55-73) 111/55 (01/08 0645) SpO2:  [96 %-100 %] 100 % (01/08 0645) Weight:  [73.5 kg (162 lb 0.6 oz)] 73.5 kg (162 lb 0.6 oz) (01/08 0645) Weight change: 0.5 kg (1 lb 1.6 oz)  Intake/Output from previous day: 01/07 0701 - 01/08 0700 In: 175 [I.V.:175] Out: -  Intake/Output this shift: No intake/output data recorded.  General appearance: alert, cooperative and no distress Resp: diminished breath sounds bilaterally Cardio: irregularly irregular rhythm Extremities: No edema  Lab Results:  Recent Labs  11/04/16 1626 11/05/16 0630  WBC 12.9* 13.2*  HGB 10.0* 9.9*  HCT 31.1* 30.7*  PLT 303 318   BMET:   Recent Labs  11/04/16 1626 11/05/16 0630  NA 130* 130*  K 4.7 5.0  CL 90* 90*  CO2 28 26  GLUCOSE 115* 97  BUN 60* 73*  CREATININE 8.26* 9.25*  CALCIUM 8.6* 8.3*   No results for input(s): PTH in the last 72 hours. Iron Studies: No results for input(s): IRON, TIBC, TRANSFERRIN, FERRITIN in the last 72 hours.  Studies/Results: No results found.  I have reviewed the patient's current medications.  Assessment/Plan  Problem #1 a trial fibrillation: Heart rate is controlled.Presently she is on Cardizem. Problem #2 end-stage renal disease: Her potassium is normal and patient is due for dialysis today. Problem #3. Anemia: Her hemoglobin is slightly low. She is on Epogen. Her hemoglobin remains stable. Problem #4 metabolic bone disease: Her calcium  is in range. Her phosphorus is slightly high this morning. Presently she is on a binder. Problem #5 hypertension: Her blood pressure is reasonably controlled Problem #6 difficulty breathing: Patient seems to  be very comfortable and denies any difficulty breathing. Plan: We'll make arrangements for patient to get dialysis today. We'll remove about 3 L if systolic blood pressure remains stable. We'll check renal panel and CBC in the morning.   LOS: 4 days   Chalonda Schlatter S 11/05/2016,9:11 AM

## 2016-11-06 ENCOUNTER — Inpatient Hospital Stay (HOSPITAL_COMMUNITY): Payer: Medicare Other

## 2016-11-06 LAB — CBC
HEMATOCRIT: 32.3 % — AB (ref 36.0–46.0)
Hemoglobin: 10.2 g/dL — ABNORMAL LOW (ref 12.0–15.0)
MCH: 29.9 pg (ref 26.0–34.0)
MCHC: 31.6 g/dL (ref 30.0–36.0)
MCV: 94.7 fL (ref 78.0–100.0)
Platelets: 382 10*3/uL (ref 150–400)
RBC: 3.41 MIL/uL — ABNORMAL LOW (ref 3.87–5.11)
RDW: 13.7 % (ref 11.5–15.5)
WBC: 13.1 10*3/uL — AB (ref 4.0–10.5)

## 2016-11-06 LAB — CULTURE, BLOOD (ROUTINE X 2)
CULTURE: NO GROWTH
Culture: NO GROWTH

## 2016-11-06 LAB — HEPARIN LEVEL (UNFRACTIONATED): HEPARIN UNFRACTIONATED: 0.4 [IU]/mL (ref 0.30–0.70)

## 2016-11-06 LAB — PROTIME-INR
INR: 1.84
Prothrombin Time: 21.5 seconds — ABNORMAL HIGH (ref 11.4–15.2)

## 2016-11-06 MED ORDER — WARFARIN SODIUM 5 MG PO TABS
5.0000 mg | ORAL_TABLET | Freq: Once | ORAL | Status: AC
  Administered 2016-11-06: 5 mg via ORAL
  Filled 2016-11-06: qty 1

## 2016-11-06 MED ORDER — DILTIAZEM HCL ER COATED BEADS 240 MG PO CP24: 240.0000 mg | ORAL_CAPSULE | Freq: Every day | ORAL | Status: DC

## 2016-11-06 MED ORDER — DILTIAZEM HCL ER COATED BEADS 240 MG PO CP24
240.0000 mg | ORAL_CAPSULE | Freq: Every day | ORAL | Status: DC
  Administered 2016-11-06 – 2016-11-07 (×2): 240 mg via ORAL
  Filled 2016-11-06 (×2): qty 1

## 2016-11-06 MED ORDER — METOPROLOL TARTRATE 5 MG/5ML IV SOLN
5.0000 mg | Freq: Once | INTRAVENOUS | Status: AC
  Administered 2016-11-06: 5 mg via INTRAVENOUS
  Filled 2016-11-06: qty 5

## 2016-11-06 MED ORDER — IPRATROPIUM-ALBUTEROL 0.5-2.5 (3) MG/3ML IN SOLN
3.0000 mL | Freq: Four times a day (QID) | RESPIRATORY_TRACT | 0 refills | Status: DC | PRN
Start: 2016-11-06 — End: 2017-04-03

## 2016-11-06 MED ORDER — WARFARIN SODIUM 5 MG PO TABS: 5.0000 mg | ORAL_TABLET | Freq: Every day | ORAL | 0 refills | Status: DC

## 2016-11-06 NOTE — [place] FL2 (Signed)
[place] MEDICAID FL2 LEVEL OF CARE SCREENING TOOL     IDENTIFICATION  Patient Name: Patricia Ayala Birthdate: [birth year] Sex: female Admission Date (Current Location): 11/01/2016  [facility] and [place] Number:  [employer] and Address:  [facility],  [address], [place] [address]      Provider Number: [id number]  Attending Physician Name and Address:  Kathlen Mody, MD  Relative Name and Phone Number:       Current Level of Care:   Recommended Level of Care:   Prior Approval Number:    Date Approved/Denied:   PASRR Number: [id number] ([id number])  Discharge Plan: SNF    Current Diagnoses: Patient Active Problem List   Diagnosis Date Noted  . Atrial fibrillation with RVR (HCC) 11/01/2016  . Acute respiratory failure (HCC) 11/01/2016  . URI (upper respiratory infection) 10/30/2016  . ESRD (end stage renal disease) (HCC) 10/29/2016  . Benign essential HTN 10/29/2016  . GERD (gastroesophageal reflux disease) 10/29/2016  . HLD (hyperlipidemia) 10/29/2016  . Acute on chronic diastolic CHF (congestive heart failure) (HCC) 10/29/2016  . Pulmonary edema 09/26/2016  . Mechanical complication of other vascular device, implant, and graft 08/07/2013  . End stage renal disease (HCC) 03/27/2012  . Anemia 02/11/2012    Orientation RESPIRATION BLADDER Height & Weight     Self, Time, Situation, Place  O2, Tracheostomy (2L) Continent Weight: 158 lb 15.2 oz (72.1 kg) Height:  5\' 4"  (162.6 cm)  BEHAVIORAL SYMPTOMS/MOOD NEUROLOGICAL BOWEL NUTRITION STATUS      Continent Diet (Diet rental with fluid restriction. Fluid restriction )  AMBULATORY STATUS COMMUNICATION OF NEEDS Skin   Limited Assist Verbally Normal                       Personal Care Assistance Level of Assistance  Bathing, Dressing, Feeding Bathing Assistance: Limited assistance Feeding assistance: Independent Dressing Assistance: Limited assistance     Functional  Limitations Info  Sight, Hearing, Speech Sight Info: Adequate Hearing Info: Adequate Speech Info: Adequate    SPECIAL CARE FACTORS FREQUENCY  PT (By licensed PT)     PT Frequency: 5x/weel              Contractures Contractures Info: Not present    Additional Factors Info  Code Status, Psychotropic Code Status Info: Full   Psychotropic Info: Xanax         Current Medications (11/06/2016):  This is the current hospital active medication list Current Facility-Administered Medications  Medication Dose Route Frequency Provider Last Rate Last Dose  . 0.9 %  sodium chloride infusion  100 mL Intravenous PRN [name], MD      . 0.9 %  sodium chloride infusion  100 mL Intravenous PRN [name], MD      . 0.9 %  sodium chloride infusion  100 mL Intravenous PRN [name], MD      . 0.9 %  sodium chloride infusion  100 mL Intravenous PRN [name], MD      . albuterol (PROVENTIL) (2.5 MG/3ML) 0.083% nebulizer solution 2.5 mL  2.5 mL Inhalation Q6H PRN Kathlen Mody, MD      . ALPRAZolam Prudy Feeler) tablet 0.5 mg  0.5 mg Oral TID PRN Kathlen Mody, MD   0.5 mg at 11/05/16 2147  . alteplase (CATHFLO ACTIVASE) injection 2 mg  2 mg Intracatheter Once PRN [name], MD      . cinacalcet (SENSIPAR) tablet 30 mg  30 mg Oral Q supper  Kathlen Mody, MD   30 mg at 11/05/16 1743  . diltiazem (CARDIZEM) tablet 60 mg  60 mg Oral Q6H Kathlen Mody, MD   60 mg at 11/06/16 0600  . doxycycline (VIBRA-TABS) tablet 100 mg  100 mg Oral Q12H Kathlen Mody, MD   100 mg at 11/06/16 1010  . guaiFENesin (MUCINEX) 12 hr tablet 600 mg  600 mg Oral BID Kathlen Mody, MD   600 mg at 11/06/16 1010  . heparin ADULT infusion 100 units/mL (25000 units/226mL sodium chloride 0.45%)  1,100 Units/hr Intravenous Continuous Kathlen Mody, MD 11 mL/hr at 11/05/16 1210 1,100 Units/hr at 11/05/16 1210  . heparin injection 1,000 Units  1,000 Units Dialysis PRN [name], MD      .  ipratropium-albuterol (DUONEB) 0.5-2.5 (3) MG/3ML nebulizer solution 3 mL  3 mL Nebulization Q6H PRN Kathlen Mody, MD      . lidocaine (PF) (XYLOCAINE) 1 % injection 5 mL  5 mL Intradermal PRN [name], MD      . lidocaine (PF) (XYLOCAINE) 1 % injection 5 mL  5 mL Intradermal PRN [name], MD      . lidocaine-prilocaine (EMLA) cream 1 application  1 application Topical PRN [name], MD      . lidocaine-prilocaine (EMLA) cream 1 application  1 application Topical PRN Kathlen Mody, MD      . metoprolol (LOPRESSOR) injection 5 mg  5 mg Intravenous Q6H PRN Kathlen Mody, MD   5 mg at 11/01/16 1559  . multivitamin (RENA-VIT) tablet 1 tablet  1 tablet Oral Daily Kathlen Mody, MD   1 tablet at 11/06/16 1010  . pantoprazole (PROTONIX) EC tablet 40 mg  40 mg Oral Daily Kathlen Mody, MD   40 mg at 11/06/16 1010  . pentafluoroprop-tetrafluoroeth (GEBAUERS) aerosol 1 application  1 application Topical PRN [name], MD      . pentoxifylline (TRENTAL) CR tablet 400 mg  400 mg Oral QAC lunch Kathlen Mody, MD   Stopped at 11/05/16 1200  . promethazine (PHENERGAN) tablet 25 mg  25 mg Oral Q6H PRN Kathlen Mody, MD      . sevelamer carbonate (RENVELA) tablet 2,400 mg  2,400 mg Oral TID WC Kathlen Mody, MD   2,400 mg at 11/06/16 0823  . sevelamer carbonate (RENVELA) tablet 800 mg  800 mg Oral Daily PRN Kathlen Mody, MD      . simvastatin (ZOCOR) tablet 10 mg  10 mg Oral QHS Kathlen Mody, MD   10 mg at 11/05/16 2147  . warfarin (COUMADIN) tablet 5 mg  5 mg Oral Once Kathlen Mody, MD      . warfarin (COUMADIN) video   Does not apply Once Kathlen Mody, MD      . Warfarin - Pharmacist Dosing Inpatient   Does not apply [id number] Kathlen Mody, MD         Discharge Medications: Please see discharge summary for a list of discharge medications.  Relevant Imaging Results:  Relevant Lab Results:   Additional Information SSN [id number]. Patient goes to dialysis on TTS at [facility] in  [place].   Nazaria Riesen, Juleen China, LCSW

## 2016-11-06 NOTE — Care Management Note (Signed)
Case Management Note  Patient Details  Name: Patricia Ayala MRN: [id number] Date of Birth: 02/19/1929   If discussed at Long Length of Stay Meetings, dates discussed:   11/06/2016 Additional Comments:  Alyne Martinson, Chrystine Oiler, RN 11/06/2016, 10:49 AM

## 2016-11-06 NOTE — Progress Notes (Addendum)
ANTICOAGULATION CONSULT NOTE  Pharmacy Consult for Heparin, coumadin Indication: atrial fibrillation  No Known Allergies  Patient Measurements: Height: 5\' 4"  (162.6 cm) Weight: 158 lb 15.2 oz (72.1 kg) IBW/kg (Calculated) : 54.7 HEPARIN DW (KG): 70.2  Vital Signs: Temp: 98.5 F (36.9 C) (01/09 0626) Temp Source: Oral (01/09 0626) BP: 123/82 (01/09 0626) Pulse Rate: 74 (01/09 0626)  Labs:  Recent Labs  11/04/16 0515 11/04/16 1626 11/05/16 0630 11/05/16 0832 11/06/16 0609  HGB 9.7* 10.0* 9.9*  --  10.2*  HCT 31.2* 31.1* 30.7*  --  32.3*  PLT 291 303 318  --  382  LABPROT 17.4*  --   --  24.4* 21.5*  INR 1.41  --   --  2.16 1.84  HEPARINUNFRC 0.22* 1.68*  --  0.52 0.40  CREATININE 7.26* 8.26* 9.25*  --   --    Estimated Creatinine Clearance: 4.3 mL/min (by C-G formula based on SCr of 9.25 mg/dL (H)).  Medical History: Past Medical History:  Diagnosis Date  . Anemia 02/11/2012  . Anxiety   . Chronic kidney disease    not on dialysis yet, Tues, thurs, sat  . H/O metabolic acidosis   . Hyperparathyroidism, secondary (HCC)   . Hypertension    Dr. Regino Schultze, [place], [place]  . Vitamin D deficiency    Medications:  Prescriptions Prior to Admission  Medication Sig Dispense Refill Last Dose  . albuterol (PROVENTIL HFA;VENTOLIN HFA) 108 (90 Base) MCG/ACT inhaler Inhale 2 puffs into the lungs every 6 (six) hours as needed for wheezing or shortness of breath. 1 Inhaler 2 unknown  . ALPRAZolam (XANAX) 0.5 MG tablet Take 1 tablet by mouth 3 (three) times daily as needed for anxiety.   Past Week at Unknown time  . amLODipine (NORVASC) 10 MG tablet Take 1 tablet (10 mg total) by mouth daily. 30 tablet 2 11/01/2016 at Unknown time  . BIOTIN PO Take 1,000 mg by mouth daily.    10/31/2016 at Unknown time  . cinacalcet (SENSIPAR) 30 MG tablet Take 30 mg by mouth at bedtime.   10/31/2016 at Unknown time  . guaiFENesin (MUCINEX) 600 MG 12 hr tablet Take 1 tablet (600 mg total) by mouth 2  (two) times daily. 30 tablet 0 10/31/2016 at Unknown time  . labetalol (NORMODYNE) 200 MG tablet Take 200 mg by mouth 2 (two) times daily.    10/31/2016 at Unknown time  . lidocaine-prilocaine (EMLA) cream Apply 1 application topically as needed (for IV access).    unknown  . multivitamin (RENA-VIT) TABS tablet Take 1 tablet by mouth daily.   10/31/2016 at Unknown time  . pantoprazole (PROTONIX) 40 MG tablet Take 1 tablet by mouth daily.   10/31/2016 at Unknown time  . pentoxifylline (TRENTAL) 400 MG CR tablet Take 400 mg by mouth 3 (three) times daily with meals.   10/31/2016 at Unknown time  . promethazine (PHENERGAN) 25 MG tablet Take 25 mg by mouth every 6 (six) hours as needed for nausea or vomiting.   unknown  . sevelamer carbonate (RENVELA) 800 MG tablet Take 2,400 mg by mouth 3 (three) times daily with meals. Take 800mg  with snacks daily.   10/31/2016 at Unknown time  . simvastatin (ZOCOR) 10 MG tablet Take 10 mg by mouth at bedtime.    10/31/2016 at Unknown time   Assessment: 83 y.o. female with medical history significant of ESRD on HD TTS, anemia, hypertension, recently discharged from the hospital on 1/2 after being treated for fluid overload. On arrival  to ED, she was found to be in Afib with RVR, new onset. Was given lovenox 1mg /kg and plan to continue anticoagulation with Heparin(no bolus per MD). Coumadin started 1/6.  Heparin level is therapeutic this am.  INR is SUBtherapeutic.   Goal of Therapy:  INR 2-3 Heparin level 0.3-0.7 units/ml Monitor platelets by anticoagulation protocol: Yes   Plan:  Continue Heparin at current rate Continue to monitor H&H and platelets Coumadin 5 mg po today  Thanks for allowing pharmacy to be a part of this patient's care.  Valrie Hart, PharmD Clinical Pharmacist Pager:  [phone] 11/06/2016

## 2016-11-06 NOTE — Discharge Summary (Signed)
Physician Discharge Summary  Patricia Ayala:[id number] DOB: 06/14/1929 DOA: 11/01/2016  PCP: [name]., MD  Admit date: 11/01/2016 Discharge date: 11/07/2016  Admitted From: Home Disposition:  SNF  Recommendations for Outpatient Follow-up:  1. Follow up with PCP in 1-2 weeks 2. Please obtain BMP/CBC in one week 3. Please follow up with nephrology as recommended.  4. Please follow up with cardiology as recommended.  5. Please get INR checked every week.     Discharge Condition:stable.  CODE STATUS:full code.  Diet recommendation: Renal diet.   Brief/Interim Summary: Patricia Fleagle Johnsonis a 83 y.o.femalewith medical history significant of ESRD on HD TTS, anemia, hypertension, recently discharged from the hospital on 1/2 after being treated for fluid overload from missing HD, presents  with sob, associated with cough, myalgias, hypoxic on arrival. She was also found to be in new onset afib with RVR. Cardiology consulted and she was started on cardizem gtt and heparin iv. Echocardiogram done.  Cardiology assistance appreciated.   Discharge Diagnoses:  Active Problems:   Anemia   ESRD (end stage renal disease) (HCC)   Benign essential HTN   URI (upper respiratory infection)   Atrial fibrillation with RVR (HCC)   Acute respiratory failure (HCC)  Acute respiratory failure with hypoxia:  Unclear etiology, suspect fluid overload with some bronchitis.  Currently on doxycycline for bronchitis.. So far cultures have been negative. Added on robitussin for cough. No fevers over the last 48 hours.  Weaned her off the oxygen as appropriate.     Normocytic anemia:  Hemoglobin stable around 10.    ESRD on HD: TTS. Further management as per renal.   Hypertension; well controlled.    New onset afib with RVR:  Rate better controlled. Minimally elevated troponins. BNP is 1962.   Echocardiogram on 11/17 shows  Wall thickness was increased in a pattern of severe LVH.  Systolic function was normal. The estimated ejection fraction was in the range of 55% to 60%. Wall motion was normal; there were no regional wall motion abnormalities. Doppler parameters are consistent with restrictive physiology, indicative of decreased left ventricular diastolic compliance and/or increased left atrial pressure. Doppler parameters are consistent with high ventricular filling pressure.  Started her on cardizem gtt, still in afib/ aflutter with rates in 80 to 90's.  Changed to oral cardizem , rate  Much better.  Started her on IV heparin for anticoagulation, coumadin started .  INR is therapeutic.  Appreciate cardiology assistance.    Hypoxia; resolved. Possibly from fluid overload. Repeat CXR shows Stable central pulmonary vascular congestion. Probable bibasilar subsegmental atelectasis. Interval placement of right internal jugular catheter with distal tip in expected position of SVC. No evidence of pneumonia or infiltrates.  Weaned her off the oxygen.  PT eval recommended SNF on discharge.  Awaiting bed placement.     Bilateral lower extremity pain and tenderness:  Family adamant about getting US of the legs venous duplex ordered, though she is already therapeutic with coumadin.    Mild leukocytosis: Unclear etiology. Monitor, prn,     Discharge Instructions   Allergies as of 11/06/2016   No Known Allergies     Medication List    STOP taking these medications   amLODipine 10 MG tablet Commonly known as:  NORVASC   labetalol 200 MG tablet Commonly known as:  NORMODYNE     TAKE these medications   albuterol 108 (90 Base) MCG/ACT inhaler Commonly known as:  PROVENTIL HFA;VENTOLIN HFA Inhale 2 puffs into the lungs every  6 (six) hours as needed for wheezing or shortness of breath.   ALPRAZolam 0.5 MG tablet Commonly known as:  XANAX Take 1 tablet by mouth 3 (three) times daily as needed for anxiety.   BIOTIN PO Take 1,000  mg by mouth daily.   cinacalcet 30 MG tablet Commonly known as:  SENSIPAR Take 30 mg by mouth at bedtime.   diltiazem 240 MG 24 hr capsule Commonly known as:  CARDIZEM CD Take 1 capsule (240 mg total) by mouth daily.   guaiFENesin 600 MG 12 hr tablet Commonly known as:  MUCINEX Take 1 tablet (600 mg total) by mouth 2 (two) times daily.   ipratropium-albuterol 0.5-2.5 (3) MG/3ML Soln Commonly known as:  DUONEB Take 3 mLs by nebulization every 6 (six) hours as needed.   lidocaine-prilocaine cream Commonly known as:  EMLA Apply 1 application topically as needed (for IV access).   multivitamin Tabs tablet Take 1 tablet by mouth daily.   pantoprazole 40 MG tablet Commonly known as:  PROTONIX Take 1 tablet by mouth daily.   pentoxifylline 400 MG CR tablet Commonly known as:  TRENTAL Take 400 mg by mouth 3 (three) times daily with meals.   promethazine 25 MG tablet Commonly known as:  PHENERGAN Take 25 mg by mouth every 6 (six) hours as needed for nausea or vomiting.   sevelamer carbonate 800 MG tablet Commonly known as:  RENVELA Take 2,400 mg by mouth 3 (three) times daily with meals. Take 800mg  with snacks daily.   simvastatin 10 MG tablet Commonly known as:  ZOCOR Take 10 mg by mouth at bedtime.   warfarin 5 MG tablet Commonly known as:  COUMADIN Take 1 tablet (5 mg total) by mouth daily at 6 PM.       No Known Allergies  Consultations:  Cardiology,   nephrology   Procedures/Studies: [place] Lower Ext Art Bilat  Result Date: 10/17/2016 CLINICAL DATA:  Bilateral lower extremity pain for 3 months with walking. EXAM: BILATERAL LOWER EXTREMITY ARTERIAL DUPLEX SCAN TECHNIQUE: Gray-scale sonography as well as color Doppler and duplex ultrasound was performed to evaluate the arteries of both lower extremities. COMPARISON:  None. FINDINGS: There is extensive plaque in the right common femoral artery which is mixed. Maximal velocity in the common femoral artery is 186  cm/sec which is somewhat elevated. Doppler analysis demonstrates a biphasic waveform. Color Doppler imaging demonstrates flow in the proximal superficial femoral artery but occlusion in the mid and distal superficial femoral artery. Flow was not visualized in the popliteal artery. Monophasic flow was present in the anterior tibial artery. There is no evidence of flow in the posterior tibial artery. There is monophasic flow in the peroneal artery. There is extensive plaque within the common femoral artery. Very poor flow is seen at the periphery of the plaque. Flow was not visualized in the superficial femoral or popliteal arteries. Monophasic flow was present in the anterior tibial artery and peroneal arteries. Flow was not visualize in the posterior tibial artery. IMPRESSION: Significant plaque with elevated velocity in the right common femoral artery. Right superficial femoral artery and popliteal artery occlusion are suggested. Monophasic waveforms in the right anterior tibial and peroneal arteries. There is no flow in the posterior tibial artery. Severe disease with subtotal occlusion of the left common femoral artery. Findings suggest occlusion of the left superficial femoral and popliteal arteries. Monophasic waveforms in the left anterior tibial and peroneal arteries. Electronically Signed   By: Jolaine Click M.D.   On: 10/17/2016  15:48   Dg Chest Port 1 View  Result Date: 11/02/2016 CLINICAL DATA:  PICC line placement. EXAM: PORTABLE CHEST 1 VIEW COMPARISON:  Radiograph of November 01, 2016. FINDINGS: Stable cardiomegaly. Atherosclerosis of thoracic aorta is noted. Central pulmonary vascular congestion is noted. No pneumothorax or significant pleural effusion is noted. Probable mild bibasilar subsegmental atelectasis is noted. Bony thorax is unremarkable. Interval placement of right internal jugular catheter with distal tip in expected position of the SVC. IMPRESSION: Cardiomegaly. Aortic atherosclerosis.  Stable central pulmonary vascular congestion. Probable bibasilar subsegmental atelectasis. Interval placement of right internal jugular catheter with distal tip in expected position of SVC. Electronically Signed   By: Lupita Raider, M.D.   On: 11/02/2016 11:41   Dg Chest Port 1 View  Result Date: 11/01/2016 CLINICAL DATA:  Dyspnea this morning EXAM: PORTABLE CHEST 1 VIEW COMPARISON:  10/29/2016 FINDINGS: Marked cardiomegaly, unchanged. Mild vascular prominence. The vascular and interstitial pattern is improved from 10/29/2016. No airspace consolidation or significant alveolar edema. IMPRESSION: Improved from 10/29/2016.  Marked cardiomegaly persists. Electronically Signed   By: Ellery Plunk M.D.   On: 11/01/2016 06:56   Dg Chest Portable 1 View  Result Date: 10/29/2016 CLINICAL DATA:  woke up feeling sob this morning. --congested cough since arriving. --swelling to bilateral lower legs - EXAM: PORTABLE CHEST 1 VIEW COMPARISON:  09/26/2016 FINDINGS: Moderate enlargement of the cardiopericardial silhouette. No mediastinal or hilar masses. There is central vascular congestion and bilateral interstitial thickening with small pleural effusions. Findings are similar to the most recent prior exam consistent with congestive heart failure. No pneumothorax. IMPRESSION: 1. Congestive heart failure similar in appearance to the most recent prior chest radiograph. Electronically Signed   By: Amie Portland M.D.   On: 10/29/2016 08:03      Subjective: NO NEW Complaints  Discharge Exam: Vitals:   11/06/16 0626 11/06/16 1212  BP: 123/82 (!) 138/55  Pulse: 74   Resp: 18   Temp: 98.5 F (36.9 C)    Vitals:   11/06/16 0626 11/06/16 1000 11/06/16 1053 11/06/16 1212  BP: 123/82   (!) 138/55  Pulse: 74     Resp: 18     Temp: 98.5 F (36.9 C)     TempSrc: Oral     SpO2: 98% 98% 98%   Weight: 72.1 kg (158 lb 15.2 oz)     Height:        General: Pt is alert, awake, not in acute  distress Cardiovascular: irregular. S1/S2 +, no rubs, no gallops Respiratory: CTA bilaterally, no wheezing, no rhonchi Abdominal: Soft, NT, ND, bowel sounds + Extremities: no edema, no cyanosis    The results of significant diagnostics from this hospitalization (including imaging, microbiology, ancillary and laboratory) are listed below for reference.     Microbiology: Recent Results (from the past 240 hour(s))  MRSA PCR Screening     Status: None   Collection Time: 10/29/16 12:28 PM  Result Value Ref Range Status   MRSA by PCR NEGATIVE NEGATIVE Final    Comment:        The GeneXpert MRSA Assay (FDA approved for NASAL specimens only), is one component of a comprehensive MRSA colonization surveillance program. It is not intended to diagnose MRSA infection nor to guide or monitor treatment for MRSA infections.   Blood Culture (routine x 2)     Status: None   Collection Time: 11/01/16  6:55 AM  Result Value Ref Range Status   Specimen Description BLOOD RIGHT HAND  Final  Special Requests BOTTLES DRAWN AEROBIC AND ANAEROBIC 6CC  Final   Culture NO GROWTH 5 DAYS  Final   Report Status 11/06/2016 FINAL  Final  Blood Culture (routine x 2)     Status: None   Collection Time: 11/01/16  7:01 AM  Result Value Ref Range Status   Specimen Description BLOOD RIGHT HAND  Final   Special Requests BOTTLES DRAWN AEROBIC ONLY 4CC  Final   Culture NO GROWTH 5 DAYS  Final   Report Status 11/06/2016 FINAL  Final     Labs: BNP (last 3 results)  Recent Labs  09/26/16 0629 10/29/16 0740 11/01/16 0655  BNP 1,570.0* 1,261.0* 1,962.0*   Basic Metabolic Panel:  Recent Labs Lab 11/01/16 0655 11/02/16 0521 11/04/16 0515 11/04/16 1626 11/05/16 0630  NA 133* 132* 131* 130* 130*  K 4.2 4.1 4.4 4.7 5.0  CL 93* 93* 93* 90* 90*  CO2 GLUCOSE 137* 144* 137* 115* 97  BUN 55* 39* 51* 60* 73*  CREATININE 7.72* 6.06* 7.26* 8.26* 9.25*  CALCIUM 9.2 8.2* 8.3* 8.6* 8.3*  PHOS   --  4.4 5.1* 5.5* 6.5*   Liver Function Tests:  Recent Labs Lab 11/01/16 0655 11/02/16 0521 11/04/16 0515 11/04/16 1626 11/05/16 0630  AST 28  --   --   --   --   ALT 33  --   --   --   --   ALKPHOS 66  --   --   --   --   BILITOT 1.3*  --   --   --   --   PROT 7.0  --   --   --   --   ALBUMIN 3.3* 3.0* 2.9* 2.8* 2.6*   No results for input(s): LIPASE, AMYLASE in the last 168 hours. No results for input(s): AMMONIA in the last 168 hours. CBC:  Recent Labs Lab 11/01/16 0655  11/03/16 0440 11/04/16 0515 11/04/16 1626 11/05/16 0630 11/06/16 0609  WBC 12.6*  < > 11.1* 11.6* 12.9* 13.2* 13.1*  NEUTROABS 10.5*  --   --   --   --   --   --   HGB 10.6*  < > 9.9* 9.7* 10.0* 9.9* 10.2*  HCT 33.8*  < > 32.2* 31.2* 31.1* 30.7* 32.3*  MCV 96.0  < > 100.0 97.5 95.7 94.8 94.7  PLT 216  < > 264 291 303 318 382  < > = values in this interval not displayed. Cardiac Enzymes:  Recent Labs Lab 11/01/16 0655 11/01/16 1110 11/01/16 2340 11/02/16 0515  TROPONINI 0.07* 0.06* 0.06* 0.05*   BNP: Invalid input(s): POCBNP CBG: No results for input(s): GLUCAP in the last 168 hours. D-Dimer No results for input(s): DDIMER in the last 72 hours. Hgb A1c No results for input(s): HGBA1C in the last 72 hours. Lipid Profile No results for input(s): CHOL, HDL, LDLCALC, TRIG, CHOLHDL, LDLDIRECT in the last 72 hours. Thyroid function studies No results for input(s): TSH, T4TOTAL, T3FREE, THYROIDAB in the last 72 hours.  Invalid input(s): FREET3 Anemia work up No results for input(s): VITAMINB12, FOLATE, FERRITIN, TIBC, IRON, RETICCTPCT in the last 72 hours. Urinalysis    Component Value Date/Time   COLORURINE YELLOW 09/24/2015 1341   APPEARANCEUR CLEAR 09/24/2015 1341   LABSPEC 1.010 09/24/2015 1341   PHURINE 8.0 09/24/2015 1341   GLUCOSEU 100 (A) 09/24/2015 1341   HGBUR MODERATE (A) 09/24/2015 1341   BILIRUBINUR NEGATIVE 09/24/2015 1341   KETONESUR NEGATIVE 09/24/2015 1341  PROTEINUR 100 (A) 09/24/2015 1341   NITRITE NEGATIVE 09/24/2015 1341   LEUKOCYTESUR NEGATIVE 09/24/2015 1341   Sepsis Labs Invalid input(s): PROCALCITONIN,  WBC,  LACTICIDVEN Microbiology Recent Results (from the past 240 hour(s))  MRSA PCR Screening     Status: None   Collection Time: 10/29/16 12:28 PM  Result Value Ref Range Status   MRSA by PCR NEGATIVE NEGATIVE Final    Comment:        The GeneXpert MRSA Assay (FDA approved for NASAL specimens only), is one component of a comprehensive MRSA colonization surveillance program. It is not intended to diagnose MRSA infection nor to guide or monitor treatment for MRSA infections.   Blood Culture (routine x 2)     Status: None   Collection Time: 11/01/16  6:55 AM  Result Value Ref Range Status   Specimen Description BLOOD RIGHT HAND  Final   Special Requests BOTTLES DRAWN AEROBIC AND ANAEROBIC 6CC  Final   Culture NO GROWTH 5 DAYS  Final   Report Status 11/06/2016 FINAL  Final  Blood Culture (routine x 2)     Status: None   Collection Time: 11/01/16  7:01 AM  Result Value Ref Range Status   Specimen Description BLOOD RIGHT HAND  Final   Special Requests BOTTLES DRAWN AEROBIC ONLY 4CC  Final   Culture NO GROWTH 5 DAYS  Final   Report Status 11/06/2016 FINAL  Final     Time coordinating discharge: Over 30 minutes  SIGNED:   Kathlen Mody, MD  [facility] 11/06/2016, 1:12 PM Pager   If 7PM-7AM, please contact night-coverage [email or web address] Password [email or web address]

## 2016-11-06 NOTE — Evaluation (Signed)
Physical Therapy Evaluation Patient Details Name: Patricia Ayala MRN: [id number] DOB: 1929/01/14 Today's Date: 11/06/2016   History of Present Illness  Patricia Ayala is a 83 y.o. female with medical history significant of ESRD on HD TTS, anemia, hypertension, recently discharged from the hospital on 1/2 after being treated for fluid overload from missing HD, presents to day with sob, associated with cough, myalgias, hypoxic on arrival. She denies any chest pain, nausea, vomiting or palpitations, syncope. On arrival to ED, she was found to be in Afib with RVR, new onset . She was started on cardizem drip and was given a dose of lovenox for anticoagulation. She was referred to medical service for admission.  Dx: Acute respiratory failure with hypoxia, fluid overload with bronchitis, and Afib with RVR.    Clinical Impression  Pt received in bed, husband present, and pt is agreeable to PT evaluation.  Pt expressed that at baseline she is independent with unlimited ambulation in the community, as well as independent with ADL's and IADL's.  However, during PT evaluation, she required Min A for supine<>sit and up to Mod A for sit<>stand due to posterior lean.  She required Min A for transfer bed<>chair due to continued posterior lean during transfer with need for vc's to correct.  Further gait not assessed due to posterior lean and fatigue with transfer.  She is recommended for SNF upon d/c due to increased level of assistance she is currently requiring for all functional mobility activities.      Follow Up Recommendations SNF    Equipment Recommendations  None recommended by PT    Recommendations for Other Services       Precautions / Restrictions Precautions Precautions: Fall Precaution Comments: immobility Restrictions Weight Bearing Restrictions: No      Mobility  Bed Mobility Overal bed mobility: Needs Assistance Bed Mobility: Supine to Sit     Supine to sit: Min assist;HOB  elevated     General bed mobility comments: increased time.   Transfers Overall transfer level: Needs assistance Equipment used: Rolling walker (2 wheeled) Transfers: Sit to/from [employer] Sit to Stand: Mod assist;Min assist Stand pivot transfers: Min assist       General transfer comment: multiple VC's for hand placement and anterior weight shift.  Pt demonstrates posterior lean and requires multiple vc's for anterior weight shift once she is standing.  Pt was able to take a few steps to transfer bed<>chair, but requires vc's for safety with getting lined up with the chair and using hands to guide herself down into the chair.    Ambulation/Gait Ambulation/Gait assistance:  (NA due to poor standing balance and poor endurance with transfer. )              Stairs            Wheelchair Mobility    Modified Rankin (Stroke Patients Only)       Balance Overall balance assessment: Needs assistance Sitting-balance support: Feet supported;Bilateral upper extremity supported Sitting balance-Leahy Scale: Fair     Standing balance support: Bilateral upper extremity supported Standing balance-Leahy Scale: Zero Standing balance comment: posterior lean during standing and needs vc's and tc's to lean forward to correct.                              Pertinent Vitals/Pain Pain Assessment: 0-10 Pain Score: 7  Pain Location: B heels and sensitive to light touch.  Can't stand  for the cover to touch it.   Pain Descriptors / Indicators: Burning;Sharp Pain Intervention(s): Limited activity within patient's tolerance;Monitored during session;RN gave pain meds during session    Home Living   Living Arrangements: Spouse/significant other Available Help at Discharge: Family (children come by 1x per week.  ) Type of Home: House Home Access: Stairs to enter   [employer] of Steps: One step at the front.   Home Layout: One level Home  Equipment: Walker - standard;Shower seat      Prior Function Level of Independence: Independent   Gait / Transfers Assistance Needed: independent  ADL's / Homemaking Assistance Needed: independent with dressing and bathing.  independent with household activities and cooking/cleaning.  Still driving, Independent with community ambulation and grocery shopping        Hand Dominance   Dominant Hand: Right    Extremity/Trunk Assessment   Upper Extremity Assessment Upper Extremity Assessment: Generalized weakness    Lower Extremity Assessment Lower Extremity Assessment: Generalized weakness       Communication   Communication: No difficulties  Cognition Arousal/Alertness: Awake/alert Behavior During Therapy: WFL for tasks assessed/performed Overall Cognitive Status: Within Functional Limits for tasks assessed                      General Comments      Exercises General Exercises - Lower Extremity Ankle Circles/Pumps: Strengthening;Both;10 reps;Seated Long Arc Quad: Strengthening;Both;10 reps;Seated Hip Flexion/Marching: Strengthening;Both;10 reps;Seated   Assessment/Plan    PT Assessment Patient needs continued PT services  PT Problem List Decreased strength;Decreased activity tolerance;Decreased balance;Decreased mobility;Decreased knowledge of use of DME;Decreased safety awareness;Cardiopulmonary status limiting activity;Impaired sensation          PT Treatment Interventions DME instruction;Gait training;Functional mobility training;Therapeutic activities;Therapeutic exercise;Balance training;Patient/family education    PT Goals (Current goals can be found in the Care Plan section)  Acute Rehab PT Goals Patient Stated Goal: To get stronger PT Goal Formulation: With patient Time For Goal Achievement: 11/13/16 Potential to Achieve Goals: Good    Frequency Min 3X/week   Barriers to discharge        Co-evaluation               End of Session  Equipment Utilized During Treatment: Gait belt Activity Tolerance: Patient limited by fatigue Patient left: in chair;with call bell/phone within reach;with family/visitor present Nurse Communication: Mobility status;Need for lift equipment Lissa Hoard, RN notified of need to use STEDY during transfer.  Mobiltiy sheet left in the room.  )    Functional Assessment Tool Used: [employer] AM-PAC "6-clicks"  Functional Limitation: Mobility: Walking and moving around Mobility: Walking and Moving Around Current Status [phone]): At least 40 percent but less than 60 percent impaired, limited or restricted Mobility: Walking and Moving Around Goal Status [phone]): At least 20 percent but less than 40 percent impaired, limited or restricted    Time: 0900-0931 PT Time Calculation (min) (ACUTE ONLY): 31 min   Charges:   PT Evaluation $PT Eval Low Complexity: 1 Procedure PT Treatments $Therapeutic Exercise: 8-22 mins   PT G Codes:   PT G-Codes **NOT FOR INPATIENT CLASS** Functional Assessment Tool Used: [employer] "6-clicks"  Functional Limitation: Mobility: Walking and moving around Mobility: Walking and Moving Around Current Status [phone]): At least 40 percent but less than 60 percent impaired, limited or restricted Mobility: Walking and Moving Around Goal Status [phone]): At least 20 percent but less than 40 percent impaired, limited or restricted    [employer],  PT, DPT X: [id number]

## 2016-11-06 NOTE — Progress Notes (Signed)
Subjective: Interval History: Except complaining of feeling weak patient offers no other complaints. Her appetite is improving and she denies any difficulty breathing. Her cough is also getting better. Objective: Vital signs in last 24 hours: Temp:  [97.8 F (36.6 C)-99.4 F (37.4 C)] 98.5 F (36.9 C) (01/09 0626) Pulse Rate:  [74-127] 74 (01/09 0626) Resp:  [16-21] 18 (01/09 0626) BP: (93-160)/(47-111) 123/82 (01/09 0626) SpO2:  [91 %-100 %] 98 % (01/09 0626) Weight:  [72.1 kg (158 lb 15.2 oz)-73.5 kg (162 lb 0.6 oz)] 72.1 kg (158 lb 15.2 oz) (01/09 0626) Weight change: 0 kg (0 lb)  Intake/Output from previous day: 01/08 0701 - 01/09 0700 In: 44 [I.V.:44] Out: 2000  Intake/Output this shift: Total I/O In: 50 [P.O.:50] Out: -   General appearance: alert, cooperative and no distress Resp: diminished breath sounds bilaterally Cardio: irregularly irregular rhythm Extremities: No edema  Lab Results:  Recent Labs  11/05/16 0630 11/06/16 0609  WBC 13.2* 13.1*  HGB 9.9* 10.2*  HCT 30.7* 32.3*  PLT 318 382   BMET:   Recent Labs  11/04/16 1626 11/05/16 0630  NA 130* 130*  K 4.7 5.0  CL 90* 90*  CO2 28 26  GLUCOSE 115* 97  BUN 60* 73*  CREATININE 8.26* 9.25*  CALCIUM 8.6* 8.3*   No results for input(s): PTH in the last 72 hours. Iron Studies: No results for input(s): IRON, TIBC, TRANSFERRIN, FERRITIN in the last 72 hours.  Studies/Results: No results found.  I have reviewed the patient's current medications.  Assessment/Plan  Problem #1 atrial fibrillation: Heart rate is controlled.Presently she is on Cardizem. Problem #2 end-stage renal disease: Her potassium is normal . She is status post hemodialysis yesterday and presently there is no uremic sign and symptoms. Problem #3. Anemia: Patient presently on Epogen during dialysis. Her hemoglobin has returned to our target goal. Problem #4 metabolic bone disease: Her calcium  is in range. Her phosphorus is  slightly high this morning. Presently she is on a binder. Problem #5 hypertension: Her blood pressure is reasonably controlled Problem #6 difficulty breathing: Presently patient is feeling much better. We're able to remove about 2 L with dialysis yesterday. Plan: 1] Patient doesn't require dialysis today 2] her next dialysis will be tomorrow. 3] We'll check renal panel and CBC in the morning.   LOS: 5 days   Patricia Ayala S 11/06/2016,10:07 AM

## 2016-11-06 NOTE — Progress Notes (Signed)
MD aware, orders to give 1800 cardizem now and call in an hr with HR. Will continue to monitor.

## 2016-11-06 NOTE — Clinical Social Work Placement (Signed)
   CLINICAL SOCIAL WORK PLACEMENT  NOTE  Date:  11/06/2016  Patient Details  Name: Patricia Ayala MRN: [id number] Date of Birth: 10-18-1929  Clinical Social Work is seeking post-discharge placement for this patient at the Skilled  Nursing Facility level of care (*CSW will initial, date and re-position this form in  chart as items are completed):  Yes   Patient/family provided with [facility] Clinical Social Work Department's list of facilities offering this level of care within the geographic area requested by the patient (or if unable, by the patient's family).  Yes   Patient/family informed of their freedom to choose among providers that offer the needed level of care, that participate in Medicare, Medicaid or managed care program needed by the patient, have an available bed and are willing to accept the patient.  Yes   Patient/family informed of [facility]'s ownership interest in [facility] and [facility], as well as of the fact that they are under no obligation to receive care at these facilities.  PASRR submitted to EDS on 11/06/16     PASRR number received on 11/06/16     Existing PASRR number confirmed on       FL2 transmitted to all facilities in geographic area requested by pt/family on 11/06/16     FL2 transmitted to all facilities within larger geographic area on       Patient informed that his/her managed care company has contracts with or will negotiate with certain facilities, including the following:        Yes   Patient/family informed of bed offers received.  Patient chooses bed at [facility]     Physician recommends and patient chooses bed at      Patient to be transferred to [facility] on 11/06/16.  Patient to be transferred to facility by [facility] hospital staff     Patient family notified on 11/06/16 of transfer.  Name of family member notified:  Allecia Koesters, daughter     PHYSICIAN       Additional Comment: CSW signing  off.   _______________________________________________ Annice Needy, LCSW 11/06/2016, 3:33 PM

## 2016-11-06 NOTE — Progress Notes (Signed)
MD made aware of continued elevated HR, stating to hold discharge for now and do an EKG. Will continue to monitor.

## 2016-11-07 ENCOUNTER — Encounter (HOSPITAL_COMMUNITY)
Admission: RE | Admit: 2016-11-07 | Discharge: 2016-11-07 | Disposition: A | Discharge status: Home / Self Care | Payer: Medicare Other | Source: Skilled Nursing Facility | Attending: Internal Medicine | Admitting: Internal Medicine

## 2016-11-07 DIAGNOSIS — I482 Chronic atrial fibrillation: Secondary | ICD-10-CM | POA: Diagnosis not present

## 2016-11-07 DIAGNOSIS — K219 Gastro-esophageal reflux disease without esophagitis: Secondary | ICD-10-CM | POA: Diagnosis not present

## 2016-11-07 DIAGNOSIS — N186 End stage renal disease: Secondary | ICD-10-CM | POA: Diagnosis not present

## 2016-11-07 DIAGNOSIS — R279 Unspecified lack of coordination: Secondary | ICD-10-CM | POA: Diagnosis not present

## 2016-11-07 DIAGNOSIS — I313 Pericardial effusion (noninflammatory): Secondary | ICD-10-CM | POA: Diagnosis not present

## 2016-11-07 DIAGNOSIS — I5033 Acute on chronic diastolic (congestive) heart failure: Secondary | ICD-10-CM | POA: Diagnosis not present

## 2016-11-07 DIAGNOSIS — R079 Chest pain, unspecified: Secondary | ICD-10-CM | POA: Diagnosis present

## 2016-11-07 DIAGNOSIS — R0602 Shortness of breath: Secondary | ICD-10-CM | POA: Diagnosis not present

## 2016-11-07 DIAGNOSIS — I501 Left ventricular failure: Secondary | ICD-10-CM | POA: Diagnosis not present

## 2016-11-07 DIAGNOSIS — N2581 Secondary hyperparathyroidism of renal origin: Secondary | ICD-10-CM | POA: Diagnosis not present

## 2016-11-07 DIAGNOSIS — Z9981 Dependence on supplemental oxygen: Secondary | ICD-10-CM | POA: Diagnosis not present

## 2016-11-07 DIAGNOSIS — Z992 Dependence on renal dialysis: Secondary | ICD-10-CM | POA: Diagnosis not present

## 2016-11-07 DIAGNOSIS — I10 Essential (primary) hypertension: Secondary | ICD-10-CM | POA: Diagnosis not present

## 2016-11-07 DIAGNOSIS — Z7951 Long term (current) use of inhaled steroids: Secondary | ICD-10-CM | POA: Diagnosis not present

## 2016-11-07 DIAGNOSIS — I4891 Unspecified atrial fibrillation: Secondary | ICD-10-CM | POA: Diagnosis not present

## 2016-11-07 DIAGNOSIS — D72829 Elevated white blood cell count, unspecified: Secondary | ICD-10-CM | POA: Diagnosis not present

## 2016-11-07 DIAGNOSIS — M898X9 Other specified disorders of bone, unspecified site: Secondary | ICD-10-CM | POA: Diagnosis not present

## 2016-11-07 DIAGNOSIS — J90 Pleural effusion, not elsewhere classified: Secondary | ICD-10-CM | POA: Diagnosis not present

## 2016-11-07 DIAGNOSIS — R05 Cough: Secondary | ICD-10-CM | POA: Diagnosis not present

## 2016-11-07 DIAGNOSIS — E785 Hyperlipidemia, unspecified: Secondary | ICD-10-CM | POA: Diagnosis not present

## 2016-11-07 DIAGNOSIS — I132 Hypertensive heart and chronic kidney disease with heart failure and with stage 5 chronic kidney disease, or end stage renal disease: Secondary | ICD-10-CM | POA: Diagnosis not present

## 2016-11-07 DIAGNOSIS — E871 Hypo-osmolality and hyponatremia: Secondary | ICD-10-CM | POA: Diagnosis not present

## 2016-11-07 DIAGNOSIS — D631 Anemia in chronic kidney disease: Secondary | ICD-10-CM | POA: Diagnosis not present

## 2016-11-07 DIAGNOSIS — Z79899 Other long term (current) drug therapy: Secondary | ICD-10-CM | POA: Diagnosis not present

## 2016-11-07 DIAGNOSIS — Z7901 Long term (current) use of anticoagulants: Secondary | ICD-10-CM | POA: Diagnosis not present

## 2016-11-07 DIAGNOSIS — I5032 Chronic diastolic (congestive) heart failure: Secondary | ICD-10-CM | POA: Diagnosis not present

## 2016-11-07 DIAGNOSIS — D649 Anemia, unspecified: Secondary | ICD-10-CM | POA: Diagnosis not present

## 2016-11-07 DIAGNOSIS — R262 Difficulty in walking, not elsewhere classified: Secondary | ICD-10-CM | POA: Diagnosis not present

## 2016-11-07 LAB — CBC
HEMATOCRIT: 32.2 % — AB (ref 36.0–46.0)
HEMOGLOBIN: 10.3 g/dL — AB (ref 12.0–15.0)
MCH: 30 pg (ref 26.0–34.0)
MCHC: 32 g/dL (ref 30.0–36.0)
MCV: 93.9 fL (ref 78.0–100.0)
Platelets: 419 10*3/uL — ABNORMAL HIGH (ref 150–400)
RBC: 3.43 MIL/uL — ABNORMAL LOW (ref 3.87–5.11)
RDW: 13.7 % (ref 11.5–15.5)
WBC: 14.4 10*3/uL — ABNORMAL HIGH (ref 4.0–10.5)

## 2016-11-07 LAB — RENAL FUNCTION PANEL
ALBUMIN: 2.7 g/dL — AB (ref 3.5–5.0)
ANION GAP: 11 (ref 5–15)
BUN: 52 mg/dL — ABNORMAL HIGH (ref 6–20)
CHLORIDE: 93 mmol/L — AB (ref 101–111)
CO2: 28 mmol/L (ref 22–32)
Calcium: 9.2 mg/dL (ref 8.9–10.3)
Creatinine, Ser: 7.24 mg/dL — ABNORMAL HIGH (ref 0.44–1.00)
GFR calc Af Amer: 5 mL/min — ABNORMAL LOW (ref >60)
GFR, EST NON AFRICAN AMERICAN: 5 mL/min — AB (ref >60)
Glucose, Bld: 108 mg/dL — ABNORMAL HIGH (ref 65–99)
PHOSPHORUS: 4.4 mg/dL (ref 2.5–4.6)
POTASSIUM: 4 mmol/L (ref 3.5–5.1)
Sodium: 132 mmol/L — ABNORMAL LOW (ref 135–145)

## 2016-11-07 LAB — PROTIME-INR
INR: 2.23
Prothrombin Time: 25.1 seconds — ABNORMAL HIGH (ref 11.4–15.2)

## 2016-11-07 LAB — HEPARIN LEVEL (UNFRACTIONATED): Heparin Unfractionated: <0.1 IU/mL — ABNORMAL LOW (ref 0.30–0.70)

## 2016-11-07 MED ORDER — EPOETIN ALFA 4000 UNIT/ML IJ SOLN
INTRAMUSCULAR | Status: DC
Start: 2016-11-07 — End: 2016-11-07
  Filled 2016-11-07: qty 1

## 2016-11-07 MED ORDER — LIDOCAINE HCL (PF) 1 % IJ SOLN: 5.0000 mL | INTRAMUSCULAR | Status: DC | PRN

## 2016-11-07 MED ORDER — WARFARIN SODIUM 5 MG PO TABS
5.0000 mg | ORAL_TABLET | Freq: Once | ORAL | Status: DC
Start: 2016-11-07 — End: 2016-11-07

## 2016-11-07 MED ORDER — SODIUM CHLORIDE 0.9 % IV SOLN: 100.0000 mL | INTRAVENOUS | Status: DC | PRN

## 2016-11-07 MED ORDER — HEPARIN SODIUM (PORCINE) 1000 UNIT/ML DIALYSIS
1000.0000 [IU] | INTRAMUSCULAR | Status: DC | PRN
  Filled 2016-11-07: qty 1

## 2016-11-07 MED ORDER — ALTEPLASE 2 MG IJ SOLR
2.0000 mg | Freq: Once | INTRAMUSCULAR | Status: DC | PRN
  Filled 2016-11-07: qty 2

## 2016-11-07 MED ORDER — EPOETIN ALFA 4000 UNIT/ML IJ SOLN
4000.0000 [IU] | Freq: Once | INTRAMUSCULAR | Status: AC
  Administered 2016-11-07: 4000 [IU] via SUBCUTANEOUS
  Filled 2016-11-07: qty 1

## 2016-11-07 NOTE — Progress Notes (Signed)
GEANNINE DHONDT  MRN: [id number]  DOB/AGE: [birth year] 83 y.o.  Primary Care Physician:FUSCO,LAWRENCE J., MD  Admit date: 11/01/2016  Chief Complaint:  Chief Complaint  Patient presents with  . Shortness of Breath    S-Pt presented on  11/01/2016 with  Chief Complaint  Patient presents with  . Shortness of Breath  .    Pt today feels better.   Meds  . cinacalcet  30 mg Oral Q supper  . diltiazem  240 mg Oral Daily  . doxycycline  100 mg Oral Q12H  . epoetin alfa      . guaiFENesin  600 mg Oral BID  . multivitamin  1 tablet Oral Daily  . pantoprazole  40 mg Oral Daily  . pentoxifylline  400 mg Oral QAC lunch  . sevelamer carbonate  2,400 mg Oral TID WC  . simvastatin  10 mg Oral QHS  . warfarin  5 mg Oral Once  . warfarin   Does not apply Once  . Warfarin - Pharmacist Dosing Inpatient   Does not apply q1800        Physical Exam: Vital signs in last 24 hours: Temp:  [97.5 F (36.4 C)-98.1 F (36.7 C)] 97.8 F (36.6 C) (01/10 1120) Pulse Rate:  [83-153] 114 (01/10 1120) Resp:  [18-22] 20 (01/10 1120) BP: (108-154)/(55-78) 154/78 (01/10 1120) SpO2:  [93 %-100 %] 93 % (01/10 0840) Weight:  [158 lb 11.7 oz (72 kg)] 158 lb 11.7 oz (72 kg) (01/10 0800) Weight change: -3 lb 4.9 oz (-1.5 kg) Last BM Date: 11/06/16  Intake/Output from previous day: 01/09 0701 - 01/10 0700 In: 214 [P.O.:170; I.V.:44] Out: -  Total I/O In: -  Out: 1100 [Other:1100]   Physical Exam: General- pt is awake,alert, oriented to time place and person Resp- No acute REsp distress, Rhonchi+ CVS- S1S2 regular in rate and rhythm GIT- BS+, soft, NT, ND EXT- trace LE Edema, NO Cyanosis Access- AVF +    Lab Results: CBC  Recent Labs  11/06/16 0609 11/07/16 0559  WBC 13.1* 14.4*  HGB 10.2* 10.3*  HCT 32.3* 32.2*  PLT 382 419*    BMET  Recent Labs  11/05/16 0630 11/07/16 0559  NA 130* 132*  K 5.0 4.0  CL 90* 93*  CO2 26 28  GLUCOSE 97 108*  BUN 73* 52*  CREATININE  9.25* 7.24*  CALCIUM 8.3* 9.2    MICRO Recent Results (from the past 240 hour(s))  MRSA PCR Screening     Status: None   Collection Time: 10/29/16 12:28 PM  Result Value Ref Range Status   MRSA by PCR NEGATIVE NEGATIVE Final    Comment:        The GeneXpert MRSA Assay (FDA approved for NASAL specimens only), is one component of a comprehensive MRSA colonization surveillance program. It is not intended to diagnose MRSA infection nor to guide or monitor treatment for MRSA infections.   Blood Culture (routine x 2)     Status: None   Collection Time: 11/01/16  6:55 AM  Result Value Ref Range Status   Specimen Description BLOOD RIGHT HAND  Final   Special Requests BOTTLES DRAWN AEROBIC AND ANAEROBIC 6CC  Final   Culture NO GROWTH 5 DAYS  Final   Report Status 11/06/2016 FINAL  Final  Blood Culture (routine x 2)     Status: None   Collection Time: 11/01/16  7:01 AM  Result Value Ref Range Status   Specimen Description BLOOD RIGHT HAND  Final  Special Requests BOTTLES DRAWN AEROBIC ONLY 4CC  Final   Culture NO GROWTH 5 DAYS  Final   Report Status 11/06/2016 FINAL  Final      Lab Results  Component Value Date   CALCIUM 9.2 11/07/2016   CAION 1.07 (L) 11/05/2014   PHOS 4.4 11/07/2016               Impression: 1)Renal  ESRD on HD                Pt is on Tue/Thurs/saturday schedule                Pt will be dialyzed today as it unable to have complete tx yesterday  2)HTN BP not at goal  Hd should help   3)Anemia In ESRD the goal for HGb is 9--11. Hgb at goal Will keep on epo  4)CKD Mineral-Bone Disorder Phosphorus on higher side.    On binders  Calcium is  at goal.  5)[facility]- Pt admitted with new onset Afib with RVR. on Cardizem and Coumadin PMD following  6)Electrolytes  Normokalemic NOrmonatremic   7)Acid base Co2 at goal     Plan:  Will dialyze today. Will try to take 2 liters off    Addendum Pt seen on  HD  Tarhonda Hollenberg S 11/07/2016, 11:53 AM

## 2016-11-07 NOTE — Clinical Social Work Note (Signed)
CSW notified Patricia Ayala and Lieutenant Diego that patient was discharging today.  CSW notified patient's daughter, Patricia Ayala, that patient was discharging today and would be transported to [facility] by [facility] staff.  CSW signing off.   Keshana Klemz, Juleen China, LCSW

## 2016-11-07 NOTE — Progress Notes (Signed)
Patient kept in the hospital an extra night due to elevated HR with a fib. Rate is now in the low 100s. Deemed ok to be discharged back to SNF. Will receive HD session today prior to DC.  Peggye Pitt, MD [facility] Pager: [phone]

## 2016-11-07 NOTE — Care Management Important Message (Signed)
Important Message  Patient Details  Name: SHANENA SKRZYPEK MRN: [id number] Date of Birth: 30-Jun-1929   Medicare Important Message Given:  Yes    Antoine Vandermeulen, Chrystine Oiler, RN 11/07/2016, 11:33 AM

## 2016-11-07 NOTE — Progress Notes (Signed)
ANTICOAGULATION CONSULT NOTE  Pharmacy Consult for Coumadin Indication: atrial fibrillation  No Known Allergies  Patient Measurements: Height: 5\' 4"  (162.6 cm) Weight: 158 lb 11.7 oz (72 kg) IBW/kg (Calculated) : 54.7 HEPARIN DW (KG): 70.2  Vital Signs: Temp: 97.8 F (36.6 C) (01/10 0840) Temp Source: Oral (01/10 0840) BP: 120/72 (01/10 1000) Pulse Rate: 83 (01/10 1000)  Labs:  Recent Labs  11/04/16 1626 11/05/16 0630 11/05/16 0832 11/06/16 0609 11/07/16 0559  HGB 10.0* 9.9*  --  10.2* 10.3*  HCT 31.1* 30.7*  --  32.3* 32.2*  PLT 303 318  --  382 419*  LABPROT  --   --  24.4* 21.5* 25.1*  INR  --   --  2.16 1.84 2.23  HEPARINUNFRC 1.68*  --  0.52 0.40 <0.10*  CREATININE 8.26* 9.25*  --   --  7.24*   Estimated Creatinine Clearance: 5.5 mL/min (by C-G formula based on SCr of 7.24 mg/dL (H)).  Medical History: Past Medical History:  Diagnosis Date  . Anemia 02/11/2012  . Anxiety   . Chronic kidney disease    not on dialysis yet, Tues, thurs, sat  . H/O metabolic acidosis   . Hyperparathyroidism, secondary (HCC)   . Hypertension    Dr. Regino Schultze, [place], [place]  . Vitamin D deficiency    Medications:  Prescriptions Prior to Admission  Medication Sig Dispense Refill Last Dose  . albuterol (PROVENTIL HFA;VENTOLIN HFA) 108 (90 Base) MCG/ACT inhaler Inhale 2 puffs into the lungs every 6 (six) hours as needed for wheezing or shortness of breath. 1 Inhaler 2 unknown  . ALPRAZolam (XANAX) 0.5 MG tablet Take 1 tablet by mouth 3 (three) times daily as needed for anxiety.   Past Week at Unknown time  . amLODipine (NORVASC) 10 MG tablet Take 1 tablet (10 mg total) by mouth daily. 30 tablet 2 11/01/2016 at Unknown time  . BIOTIN PO Take 1,000 mg by mouth daily.    10/31/2016 at Unknown time  . cinacalcet (SENSIPAR) 30 MG tablet Take 30 mg by mouth at bedtime.   10/31/2016 at Unknown time  . guaiFENesin (MUCINEX) 600 MG 12 hr tablet Take 1 tablet (600 mg total) by mouth 2 (two)  times daily. 30 tablet 0 10/31/2016 at Unknown time  . labetalol (NORMODYNE) 200 MG tablet Take 200 mg by mouth 2 (two) times daily.    10/31/2016 at Unknown time  . lidocaine-prilocaine (EMLA) cream Apply 1 application topically as needed (for IV access).    unknown  . multivitamin (RENA-VIT) TABS tablet Take 1 tablet by mouth daily.   10/31/2016 at Unknown time  . pantoprazole (PROTONIX) 40 MG tablet Take 1 tablet by mouth daily.   10/31/2016 at Unknown time  . pentoxifylline (TRENTAL) 400 MG CR tablet Take 400 mg by mouth 3 (three) times daily with meals.   10/31/2016 at Unknown time  . promethazine (PHENERGAN) 25 MG tablet Take 25 mg by mouth every 6 (six) hours as needed for nausea or vomiting.   unknown  . sevelamer carbonate (RENVELA) 800 MG tablet Take 2,400 mg by mouth 3 (three) times daily with meals. Take 800mg  with snacks daily.   10/31/2016 at Unknown time  . simvastatin (ZOCOR) 10 MG tablet Take 10 mg by mouth at bedtime.    10/31/2016 at Unknown time   Assessment: 83 y.o. female with medical history significant of ESRD on HD TTS, anemia, hypertension, recently discharged from the hospital on 1/2 after being treated for fluid overload. On arrival to  ED, she was found to be in Afib with RVR, new onset. Coumadin started 1/6.  INR is therapeutic.   Goal of Therapy:  INR 2-3 Monitor platelets by anticoagulation protocol: Yes   Plan:  Coumadin 5 mg po today INR daily Monitor for s/sx of bleeding  Thanks for allowing pharmacy to be a part of this patient's care.  Valrie Hart, PharmD Clinical Pharmacist Pager:  [phone] 11/07/2016

## 2016-11-08 ENCOUNTER — Encounter (HOSPITAL_COMMUNITY): Payer: Self-pay

## 2016-11-08 ENCOUNTER — Encounter: Payer: Self-pay | Admitting: Internal Medicine

## 2016-11-08 ENCOUNTER — Non-Acute Institutional Stay (SKILLED_NURSING_FACILITY): Payer: Medicare Other | Admitting: Internal Medicine

## 2016-11-08 ENCOUNTER — Encounter (HOSPITAL_COMMUNITY)
Admission: RE | Admit: 2016-11-08 | Discharge: 2016-11-08 | Disposition: A | Discharge status: Home / Self Care | Payer: Medicare Other | Source: Skilled Nursing Facility | Attending: Internal Medicine | Admitting: Internal Medicine

## 2016-11-08 ENCOUNTER — Emergency Department (HOSPITAL_COMMUNITY): Payer: Medicare Other

## 2016-11-08 ENCOUNTER — Inpatient Hospital Stay (HOSPITAL_COMMUNITY)
Admission: EM | Admit: 2016-11-08 | Discharge: 2016-11-10 | DRG: 308 | Disposition: A | Discharge status: Skilled Nursing Facility | Payer: Medicare Other | Attending: Internal Medicine | Admitting: Internal Medicine

## 2016-11-08 DIAGNOSIS — Z79899 Other long term (current) drug therapy: Secondary | ICD-10-CM

## 2016-11-08 DIAGNOSIS — Z7901 Long term (current) use of anticoagulants: Secondary | ICD-10-CM | POA: Diagnosis not present

## 2016-11-08 DIAGNOSIS — I5032 Chronic diastolic (congestive) heart failure: Secondary | ICD-10-CM | POA: Diagnosis not present

## 2016-11-08 DIAGNOSIS — I482 Chronic atrial fibrillation: Secondary | ICD-10-CM | POA: Diagnosis not present

## 2016-11-08 DIAGNOSIS — I4891 Unspecified atrial fibrillation: Secondary | ICD-10-CM | POA: Insufficient documentation

## 2016-11-08 DIAGNOSIS — M6281 Muscle weakness (generalized): Secondary | ICD-10-CM | POA: Diagnosis not present

## 2016-11-08 DIAGNOSIS — R262 Difficulty in walking, not elsewhere classified: Secondary | ICD-10-CM | POA: Diagnosis not present

## 2016-11-08 DIAGNOSIS — E785 Hyperlipidemia, unspecified: Secondary | ICD-10-CM | POA: Diagnosis not present

## 2016-11-08 DIAGNOSIS — D72829 Elevated white blood cell count, unspecified: Secondary | ICD-10-CM | POA: Diagnosis present

## 2016-11-08 DIAGNOSIS — I5033 Acute on chronic diastolic (congestive) heart failure: Secondary | ICD-10-CM

## 2016-11-08 DIAGNOSIS — N186 End stage renal disease: Secondary | ICD-10-CM | POA: Diagnosis not present

## 2016-11-08 DIAGNOSIS — Z7951 Long term (current) use of inhaled steroids: Secondary | ICD-10-CM | POA: Diagnosis not present

## 2016-11-08 DIAGNOSIS — R05 Cough: Secondary | ICD-10-CM | POA: Diagnosis not present

## 2016-11-08 DIAGNOSIS — D631 Anemia in chronic kidney disease: Secondary | ICD-10-CM

## 2016-11-08 DIAGNOSIS — N2581 Secondary hyperparathyroidism of renal origin: Secondary | ICD-10-CM | POA: Diagnosis present

## 2016-11-08 DIAGNOSIS — I132 Hypertensive heart and chronic kidney disease with heart failure and with stage 5 chronic kidney disease, or end stage renal disease: Secondary | ICD-10-CM | POA: Diagnosis present

## 2016-11-08 DIAGNOSIS — E871 Hypo-osmolality and hyponatremia: Secondary | ICD-10-CM | POA: Diagnosis present

## 2016-11-08 DIAGNOSIS — Z992 Dependence on renal dialysis: Secondary | ICD-10-CM | POA: Diagnosis not present

## 2016-11-08 DIAGNOSIS — M898X9 Other specified disorders of bone, unspecified site: Secondary | ICD-10-CM | POA: Diagnosis present

## 2016-11-08 DIAGNOSIS — I501 Left ventricular failure: Secondary | ICD-10-CM | POA: Diagnosis not present

## 2016-11-08 DIAGNOSIS — K219 Gastro-esophageal reflux disease without esophagitis: Secondary | ICD-10-CM | POA: Diagnosis present

## 2016-11-08 DIAGNOSIS — Z9981 Dependence on supplemental oxygen: Secondary | ICD-10-CM | POA: Diagnosis not present

## 2016-11-08 DIAGNOSIS — J90 Pleural effusion, not elsewhere classified: Secondary | ICD-10-CM | POA: Diagnosis not present

## 2016-11-08 DIAGNOSIS — R079 Chest pain, unspecified: Secondary | ICD-10-CM | POA: Diagnosis not present

## 2016-11-08 DIAGNOSIS — I10 Essential (primary) hypertension: Secondary | ICD-10-CM | POA: Diagnosis not present

## 2016-11-08 DIAGNOSIS — R279 Unspecified lack of coordination: Secondary | ICD-10-CM | POA: Diagnosis not present

## 2016-11-08 DIAGNOSIS — R0602 Shortness of breath: Secondary | ICD-10-CM | POA: Diagnosis not present

## 2016-11-08 DIAGNOSIS — I313 Pericardial effusion (noninflammatory): Secondary | ICD-10-CM | POA: Diagnosis not present

## 2016-11-08 DIAGNOSIS — D649 Anemia, unspecified: Secondary | ICD-10-CM | POA: Diagnosis not present

## 2016-11-08 HISTORY — DX: Heart failure, unspecified: I50.9

## 2016-11-08 HISTORY — DX: Hyperlipidemia, unspecified: E78.5

## 2016-11-08 HISTORY — DX: Unspecified atrial fibrillation: I48.91

## 2016-11-08 HISTORY — DX: Gastro-esophageal reflux disease without esophagitis: K21.9

## 2016-11-08 LAB — CBC WITH DIFFERENTIAL/PLATELET
Basophils Absolute: 0.1 10*3/uL (ref 0.0–0.1)
Basophils Relative: 0 %
EOS ABS: 0.2 10*3/uL (ref 0.0–0.7)
Eosinophils Relative: 1 %
HCT: 37.7 % (ref 36.0–46.0)
HEMOGLOBIN: 11.9 g/dL — AB (ref 12.0–15.0)
LYMPHS ABS: 2.1 10*3/uL (ref 0.7–4.0)
LYMPHS PCT: 11 %
MCH: 29.9 pg (ref 26.0–34.0)
MCHC: 31.6 g/dL (ref 30.0–36.0)
MCV: 94.7 fL (ref 78.0–100.0)
Monocytes Absolute: 2.2 10*3/uL — ABNORMAL HIGH (ref 0.1–1.0)
Monocytes Relative: 11 %
NEUTROS PCT: 77 %
Neutro Abs: 14.4 10*3/uL — ABNORMAL HIGH (ref 1.7–7.7)
Platelets: 530 10*3/uL — ABNORMAL HIGH (ref 150–400)
RBC: 3.98 MIL/uL (ref 3.87–5.11)
RDW: 13.5 % (ref 11.5–15.5)
WBC: 19 10*3/uL — AB (ref 4.0–10.5)

## 2016-11-08 LAB — COMPREHENSIVE METABOLIC PANEL
ALK PHOS: 66 U/L (ref 38–126)
ALT: 19 U/L (ref 14–54)
AST: 23 U/L (ref 15–41)
Albumin: 3.2 g/dL — ABNORMAL LOW (ref 3.5–5.0)
Anion gap: 13 (ref 5–15)
BUN: 27 mg/dL — AB (ref 6–20)
CALCIUM: 9.8 mg/dL (ref 8.9–10.3)
CO2: 29 mmol/L (ref 22–32)
CREATININE: 4.49 mg/dL — AB (ref 0.44–1.00)
Chloride: 92 mmol/L — ABNORMAL LOW (ref 101–111)
GFR calc non Af Amer: 8 mL/min — ABNORMAL LOW (ref >60)
GFR, EST AFRICAN AMERICAN: 9 mL/min — AB (ref >60)
GLUCOSE: 140 mg/dL — AB (ref 65–99)
Potassium: 3.7 mmol/L (ref 3.5–5.1)
SODIUM: 134 mmol/L — AB (ref 135–145)
Total Bilirubin: 1 mg/dL (ref 0.3–1.2)
Total Protein: 7.8 g/dL (ref 6.5–8.1)

## 2016-11-08 LAB — TROPONIN I
Troponin I: 0.06 ng/mL (ref <0.03)
Troponin I: 0.05 ng/mL (ref <0.03)

## 2016-11-08 LAB — PROTIME-INR
INR: 2.34
Prothrombin Time: 26.1 seconds — ABNORMAL HIGH (ref 11.4–15.2)

## 2016-11-08 MED ORDER — SODIUM CHLORIDE 0.9% FLUSH: 3.0000 mL | INTRAVENOUS | Status: DC | PRN

## 2016-11-08 MED ORDER — CINACALCET HCL 30 MG PO TABS
30.0000 mg | ORAL_TABLET | Freq: Every day | ORAL | Status: DC
  Administered 2016-11-08 – 2016-11-09 (×2): 30 mg via ORAL
  Filled 2016-11-08 (×4): qty 1

## 2016-11-08 MED ORDER — PENTOXIFYLLINE ER 400 MG PO TBCR
400.0000 mg | EXTENDED_RELEASE_TABLET | Freq: Three times a day (TID) | ORAL | Status: DC
  Administered 2016-11-09 – 2016-11-10 (×6): 400 mg via ORAL
  Filled 2016-11-08 (×6): qty 1

## 2016-11-08 MED ORDER — DILTIAZEM LOAD VIA INFUSION
20.0000 mg | Freq: Once | INTRAVENOUS | Status: AC
  Administered 2016-11-08: 20 mg via INTRAVENOUS
  Filled 2016-11-08: qty 20

## 2016-11-08 MED ORDER — SIMVASTATIN 20 MG PO TABS
10.0000 mg | ORAL_TABLET | Freq: Every day | ORAL | Status: DC
  Administered 2016-11-08 – 2016-11-09 (×2): 10 mg via ORAL
  Filled 2016-11-08 (×2): qty 1

## 2016-11-08 MED ORDER — PANTOPRAZOLE SODIUM 40 MG PO TBEC
40.0000 mg | DELAYED_RELEASE_TABLET | Freq: Every day | ORAL | Status: DC
Start: 2016-11-09 — End: 2016-11-10
  Administered 2016-11-09 – 2016-11-10 (×2): 40 mg via ORAL
  Filled 2016-11-08 (×2): qty 1

## 2016-11-08 MED ORDER — METOPROLOL TARTRATE 25 MG PO TABS
25.0000 mg | ORAL_TABLET | Freq: Two times a day (BID) | ORAL | Status: DC
  Administered 2016-11-08 – 2016-11-10 (×4): 25 mg via ORAL
  Filled 2016-11-08 (×4): qty 1

## 2016-11-08 MED ORDER — ALPRAZOLAM 0.5 MG PO TABS
0.5000 mg | ORAL_TABLET | Freq: Three times a day (TID) | ORAL | Status: DC | PRN
  Administered 2016-11-08 – 2016-11-09 (×2): 0.5 mg via ORAL
  Filled 2016-11-08 (×2): qty 1

## 2016-11-08 MED ORDER — ONDANSETRON HCL 4 MG PO TABS: 4.0000 mg | ORAL_TABLET | Freq: Four times a day (QID) | ORAL | Status: DC | PRN

## 2016-11-08 MED ORDER — ACETAMINOPHEN 325 MG PO TABS: 650.0000 mg | ORAL_TABLET | Freq: Four times a day (QID) | ORAL | Status: DC | PRN

## 2016-11-08 MED ORDER — SEVELAMER CARBONATE 800 MG PO TABS
2400.0000 mg | ORAL_TABLET | Freq: Three times a day (TID) | ORAL | Status: DC
  Administered 2016-11-09 – 2016-11-10 (×5): 2400 mg via ORAL
  Filled 2016-11-08 (×5): qty 3

## 2016-11-08 MED ORDER — ACETAMINOPHEN 650 MG RE SUPP: 650.0000 mg | Freq: Four times a day (QID) | RECTAL | Status: DC | PRN

## 2016-11-08 MED ORDER — SODIUM CHLORIDE 0.9 % IV SOLN: 250.0000 mL | INTRAVENOUS | Status: DC | PRN

## 2016-11-08 MED ORDER — ONDANSETRON 4 MG PO TBDP
4.0000 mg | ORAL_TABLET | Freq: Once | ORAL | Status: AC
  Administered 2016-11-08: 4 mg via ORAL
  Filled 2016-11-08: qty 1

## 2016-11-08 MED ORDER — DILTIAZEM HCL 100 MG IV SOLR
5.0000 mg/h | INTRAVENOUS | Status: DC
  Administered 2016-11-08: 5 mg/h via INTRAVENOUS
  Administered 2016-11-09 (×2): 15 mg/h via INTRAVENOUS
  Filled 2016-11-08 (×3): qty 100

## 2016-11-08 MED ORDER — ONDANSETRON HCL 4 MG/2ML IJ SOLN
4.0000 mg | Freq: Four times a day (QID) | INTRAMUSCULAR | Status: DC | PRN
Start: 2016-11-08 — End: 2016-11-10

## 2016-11-08 MED ORDER — SODIUM CHLORIDE 0.9% FLUSH: 3.0000 mL | Freq: Two times a day (BID) | INTRAVENOUS | Status: DC

## 2016-11-08 MED ORDER — ALBUTEROL SULFATE (2.5 MG/3ML) 0.083% IN NEBU
5.0000 mg | INHALATION_SOLUTION | Freq: Once | RESPIRATORY_TRACT | Status: AC
  Administered 2016-11-08: 5 mg via RESPIRATORY_TRACT
  Filled 2016-11-08: qty 6

## 2016-11-08 MED ORDER — WARFARIN - PHARMACIST DOSING INPATIENT
Freq: Every day | Status: DC
  Administered 2016-11-09: 1

## 2016-11-08 NOTE — ED Notes (Signed)
CRITICAL VALUE ALERT  Critical value received: trop 0.06  Date of notification:  11/08/16  Time of notification:  2016  Critical value read back:yes  Nurse who received alert:  Wynona Dove rn  MD notified (1st page):  miller  Time of first page:  2017  MD notified (2nd page):  Time of second page:  Responding MD:  Hyacinth Meeker  Time MD responded:  2017

## 2016-11-08 NOTE — ED Provider Notes (Signed)
[facility] DEPT Provider Note   CSN: [id number] Arrival date & time: 11/08/16  1825     History   Chief Complaint Chief Complaint  Patient presents with  . Atrial Fibrillation    HPI Patricia Ayala is a 83 y.o. female.  HPI  The patient is an 83 year old female, she has a history of chronic kidney disease on dialysis Tuesday Thursday and Saturday, she has a history of hyperlipidemia and a history of hypertension. She was recently admitted to the hospital during which time the patient was in fluid overload and underwent dialysis successfully however it was noted that she had acute onset of atrial fibrillation with rapid ventricular response. This was new onset, she was started on Cardizem for rate control as well as Coumadin for anticoagulation purposes. During the hospitalization the patient had a Doppler of her lower extremity which showed a chronic nonocclusive DVT behind her left knee as well as in the left femoral vein. She was already anticoagulated with Coumadin thus no other treatment was initiated for that. She was discharged in stable condition to the [facility] where she has been undergoing some physical therapy for the last 24-48 hours. It was noted today that the patient was having some shortness of breath and chest discomfort, at that time the patient was noted to have atrial fibrillation with a rapid rate around 150 that she was sent back to the hospital. At this time the patient complains of minimal discomfort in her chest when she complains of pain in her legs which is chronic.  The patient also reports that she has had approximately 2 weeks of progressive coughing and wheezing, she is bringing up sputum  Past Medical History:  Diagnosis Date  . Anemia 02/11/2012  . Anxiety   . Atrial fibrillation (HCC)   . CHF (congestive heart failure) (HCC)   . Chronic kidney disease    not on dialysis yet, Tues, thurs, sat  . GERD (gastroesophageal reflux disease)   . H/O  metabolic acidosis   . Hyperlipidemia   . Hyperparathyroidism, secondary (HCC)   . Hypertension    Dr. Regino Schultze, [place], [place]  . Oxygen dependent   . Vitamin D deficiency     Patient Active Problem List   Diagnosis Date Noted  . Atrial fibrillation with RVR (HCC) 11/01/2016  . Acute respiratory failure (HCC) 11/01/2016  . URI (upper respiratory infection) 10/30/2016  . ESRD (end stage renal disease) (HCC) 10/29/2016  . Benign essential HTN 10/29/2016  . GERD (gastroesophageal reflux disease) 10/29/2016  . HLD (hyperlipidemia) 10/29/2016  . Acute on chronic diastolic CHF (congestive heart failure) (HCC) 10/29/2016  . Pulmonary edema 09/26/2016  . Mechanical complication of other vascular device, implant, and graft 08/07/2013  . End stage renal disease (HCC) 03/27/2012  . Anemia 02/11/2012    Past Surgical History:  Procedure Laterality Date  . ABDOMINAL HYSTERECTOMY    . AV FISTULA PLACEMENT  04/01/2012   Procedure: ARTERIOVENOUS (AV) FISTULA CREATION;  Surgeon: Sherren Kerns, MD;  Location: [facility] OR;  Service: Vascular;  Laterality: Left;  . CHOLECYSTECTOMY    . COLON SURGERY     for a blockage  . EYE SURGERY     bilateral cataracts, /w IOL - 2013  . FISTULOGRAM N/A 11/05/2014   Procedure: FISTULOGRAM;  Surgeon: Larina Earthly, MD;  Location: [facility] CATH LAB;  Service: Cardiovascular;  Laterality: N/A;  . INSERTION OF DIALYSIS CATHETER  04/10/2012   Procedure: INSERTION OF DIALYSIS CATHETER;  Surgeon: Quita Skye  Hart Rochester, MD;  Location: [facility] OR;  Service: Vascular;  Laterality: N/A;  Insertion Diatek Catheter Right Internal Jugular  . LIGATION OF COMPETING BRANCHES OF ARTERIOVENOUS FISTULA  11/07/2012   Procedure: LIGATION OF COMPETING BRANCHES OF ARTERIOVENOUS FISTULA;  Surgeon: Chuck Hint, MD;  Location: [facility] OR;  Service: Vascular;  Laterality: Left;  Brachio/Cephalic Fistula  . SHUNTOGRAM N/A 11/03/2012   Procedure: Betsey Amen;  Surgeon: Chuck Hint, MD;  Location: [facility]  CATH LAB;  Service: Cardiovascular;  Laterality: N/A;  . SHUNTOGRAM Left 03/09/2013   Procedure: Fistulogram;  Surgeon: Fransisco Hertz, MD;  Location: [facility] CATH LAB;  Service: Cardiovascular;  Laterality: Left;  . SHUNTOGRAM Left 11/23/2013   Procedure: FISTULOGRAM;  Surgeon: Fransisco Hertz, MD;  Location: [facility] CATH LAB;  Service: Cardiovascular;  Laterality: Left;    OB History    No data available       Home Medications    Prior to Admission medications   Medication Sig Start Date End Date Taking? Authorizing Provider  albuterol (PROVENTIL HFA;VENTOLIN HFA) 108 (90 Base) MCG/ACT inhaler Inhale 2 puffs into the lungs every 6 (six) hours as needed for wheezing or shortness of breath. 10/30/16   Erick Blinks, MD  ALPRAZolam Prudy Feeler) 0.5 MG tablet Take 1 tablet by mouth 3 (three) times daily as needed for anxiety. 09/21/16   Historical Provider, MD  Biotin 1 MG CAPS Take 1 mg by mouth daily.    Historical Provider, MD  cinacalcet (SENSIPAR) 30 MG tablet Take 30 mg by mouth at bedtime.    Historical Provider, MD  diltiazem (CARDIZEM CD) 240 MG 24 hr capsule Take 1 capsule (240 mg total) by mouth daily. 11/06/16   Kathlen Mody, MD  guaiFENesin (MUCINEX) 600 MG 12 hr tablet Take 1 tablet (600 mg total) by mouth 2 (two) times daily. 10/30/16   Erick Blinks, MD  ipratropium-albuterol (DUONEB) 0.5-2.5 (3) MG/3ML SOLN Take 3 mLs by nebulization every 6 (six) hours as needed. 11/06/16   Kathlen Mody, MD  lidocaine-prilocaine (EMLA) cream Apply 1 application topically as needed (for IV access).  01/12/13   Historical Provider, MD  multivitamin (RENA-VIT) TABS tablet Take 1 tablet by mouth daily.    Historical Provider, MD  pantoprazole (PROTONIX) 40 MG tablet Take 1 tablet by mouth daily. 09/03/16   Historical Provider, MD  pentoxifylline (TRENTAL) 400 MG CR tablet Take 400 mg by mouth 3 (three) times daily with meals.    Historical Provider, MD  promethazine (PHENERGAN) 25 MG tablet Take 25 mg by mouth every 6  (six) hours as needed for nausea or vomiting.    Historical Provider, MD  sevelamer carbonate (RENVELA) 800 MG tablet Take 2,400 mg by mouth 3 (three) times daily with meals. Take 800mg  with snacks daily.    Historical Provider, MD  simvastatin (ZOCOR) 10 MG tablet Take 10 mg by mouth at bedtime.  02/06/13   Historical Provider, MD  warfarin (COUMADIN) 5 MG tablet Take 1 tablet (5 mg total) by mouth daily at 6 PM. 11/06/16   Kathlen Mody, MD    Family History Family History  Problem Relation Age of Onset  . Stroke Mother   . Cancer Father   . Heart attack Father     Social History Social History  Substance Use Topics  . Smoking status: Never Smoker  . Smokeless tobacco: Never Used  . Alcohol use No     Allergies   Patient has no known allergies.   Review of Systems Review of  Systems  All other systems reviewed and are negative.    Physical Exam Updated Vital Signs BP 127/86 (BP Location: Right Arm)   Pulse (!) 54   Temp 98.5 F (36.9 C) (Oral)   Resp 19   Ht 5\' 4"  (1.626 m)   Wt 158 lb (71.7 kg)   SpO2 94%   BMI 27.12 kg/m   Physical Exam  Constitutional: She appears well-developed and well-nourished. She appears distressed.  HENT:  Head: Normocephalic and atraumatic.  Mouth/Throat: Oropharynx is clear and moist. No oropharyngeal exudate.  Eyes: Conjunctivae and EOM are normal. Pupils are equal, round, and reactive to light. Right eye exhibits no discharge. Left eye exhibits no discharge. No scleral icterus.  Neck: Normal range of motion. Neck supple. No JVD present. No thyromegaly present.  Cardiovascular: Normal heart sounds and intact distal pulses.  Exam reveals no gallop and no friction rub.   No murmur heard. Tachycardic with a rapid ventricular rate, irregularly irregular, good thrill in the left upper extremity fistula  Pulmonary/Chest: She is in respiratory distress. She has wheezes. She has no rales.  Abdominal: Soft. Bowel sounds are normal. She  exhibits no distension and no mass. There is no tenderness.  Musculoskeletal: Normal range of motion. She exhibits no edema or tenderness.  Lymphadenopathy:    She has no cervical adenopathy.  Neurological: She is alert. Coordination normal.  Skin: Skin is warm and dry. No rash noted. No erythema.  Psychiatric: She has a normal mood and affect. Her behavior is normal.  Nursing note and vitals reviewed.    ED Treatments / Results  Labs (all labs ordered are listed, but only abnormal results are displayed) Labs Reviewed  CBC WITH DIFFERENTIAL/PLATELET - Abnormal; Notable for the following:       Result Value   WBC 19.0 (*)    Hemoglobin 11.9 (*)    Platelets 530 (*)    Neutro Abs 14.4 (*)    Monocytes Absolute 2.2 (*)    All other components within normal limits  COMPREHENSIVE METABOLIC PANEL - Abnormal; Notable for the following:    Sodium 134 (*)    Chloride 92 (*)    Glucose, Bld 140 (*)    BUN 27 (*)    Creatinine, Ser 4.49 (*)    Albumin 3.2 (*)    GFR calc non Af Amer 8 (*)    GFR calc Af Amer 9 (*)    All other components within normal limits  TROPONIN I - Abnormal; Notable for the following:    Troponin I 0.06 (*)    All other components within normal limits    EKG  EKG Interpretation None       Radiology Dg Chest Port 1 View  Result Date: 11/08/2016 CLINICAL DATA:  Atrial fibrillation, shortness of breath and chest tightness. Nonproductive cough. EXAM: PORTABLE CHEST 1 VIEW COMPARISON:  11/02/2016 FINDINGS: Cardiomediastinal silhouette is enlarged and globular. Mediastinal contours appear intact. Calcific atherosclerotic disease and tortuosity of the aorta seen. There is no evidence of focal airspace consolidation, pleural effusion or pneumothorax. Mild pulmonary vascular congestion. Osseous structures are without acute abnormality. Soft tissues are grossly normal. IMPRESSION: Enlarged and globular cardiac silhouette. This may represent cardiomegaly or  pericardial effusion. Please correlate clinically. Mild pulmonary vascular congestion. Calcific atherosclerotic disease of the aorta. Electronically Signed   By: Ted Mcalpine M.D.   On: 11/08/2016 19:18    Procedures Procedures (including critical care time)  Medications Ordered in ED Medications  diltiazem (CARDIZEM)  1 mg/mL load via infusion 20 mg (20 mg Intravenous Bolus from Bag 11/08/16 1922)    And  diltiazem (CARDIZEM) 100 mg in dextrose 5 % 100 mL (1 mg/mL) infusion (5 mg/hr Intravenous New Bag/Given 11/08/16 1921)  albuterol (PROVENTIL) (2.5 MG/3ML) 0.083% nebulizer solution 5 mg (5 mg Nebulization Given 11/08/16 1935)  ondansetron (ZOFRAN-ODT) disintegrating tablet 4 mg (4 mg Oral Given 11/08/16 1949)     Initial Impression / Assessment and Plan / ED Course  I have reviewed the triage vital signs and the nursing notes.  Pertinent labs & imaging results that were available during my care of the patient were reviewed by me and considered in my medical decision making (see chart for details).  Clinical Course    The patient's EKG doesn't fact show atrial fibrillation with a rapid ventricular rate, at this time I do not necessarily think this is acute ischemia, she does have known congestive disease likely related to fluid overload and I would not be surprised if she has chronic atrial fibrillation from this point forced though at this time she cannot be acutely converted by cardioversion secondary to the length of time she has been anticoagulated. The patient likely would benefit from rate control, she will likely need inpatient admission, the patient does appear critically ill with rapid ventricular rate. Cardizem drip will be started   The patient has ongoing coughing and wheezing as well, the etiology of this is not clear, consider bronchitis, asthma, potentially fluid overload causing wheezing as well though she did have dialysis today and reports that the coughing and wheezing  is been going on for weeks making fluid overload less likely   Cardizem drip with minimal improvement, ongoing severe tachycardia, the patient is critically ill. Bedside ultrasound does reveal a small pericardial effusion but no signs of tamponade. Hospitalist consultation with Dr. Sharl Ma, will place in stepdown bed.    ED ECG REPORT  I personally interpreted this EKG   Date: 11/08/2016   Rate: 160  Rhythm: atrial fibrillation  QRS Axis: indeterminate  Intervals: normal  ST/T Wave abnormalities: nonspecific ST/T changes  Conduction Disutrbances:noine  Narrative Interpretation:   Old EKG Reviewed: unchanged   CRITICAL CARE Performed by: Vida Roller Total critical care time: 35 minutes Critical care time was exclusive of separately billable procedures and treating other patients. Critical care was necessary to treat or prevent imminent or life-threatening deterioration. Critical care was time spent personally by me on the following activities: development of treatment plan with patient and/or surrogate as well as nursing, discussions with consultants, evaluation of patient's response to treatment, examination of patient, obtaining history from patient or surrogate, ordering and performing treatments and interventions, ordering and review of laboratory studies, ordering and review of radiographic studies, pulse oximetry and re-evaluation of patient's condition.   Final Clinical Impressions(s) / ED Diagnoses   Final diagnoses:  Atrial fibrillation with rapid ventricular response [facility])    New Prescriptions New Prescriptions   No medications on file     Eber Hong, MD 11/08/16 2050

## 2016-11-08 NOTE — H&P (Signed)
TRH H&P    Patient Demographics:    Patricia Ayala, is a 83 y.o. female  MRN: [id number]  DOB - [birth year]  Admit Date - 11/08/2016  Referring MD/NP/PA: Dr Hyacinth Meeker  Outpatient Primary MD for the patient is [name]., MD  Patient coming from: SNF     Chief Complaint  Patient presents with  . Atrial Fibrillation      HPI:    Patricia Ayala  is a 83 y.o. female, With history of ESRD on hemodialysis Tuesday Thursday and Saturday, hypertension who was recently admitted to the hospital for atrial fibrillation with RVR and discharged to skilled facility on 11/07/2016. Patient underwent physical therapy and last 24 hours and it was noted that patient had shortness of breath and chest discomfort. Patient was found to be in atrial fibrillation with RVR with heart rate 150s and she was sent back to hospital for further evaluation. In the ED patient started on Cardizem drip 10 mg per hour, and heart rate is still in 140s.  It is noted that patient was on labetalol 200 mg twice a day prior to admission on 11/01/2016, which was stopped during hospitalization and patient discharged on Cardizem.   She denies nausea, no vomiting. No diarrhea. No headache or blurred vision.    Review of systems:    In addition to the HPI above,  No Fever-chills, No Headache, No changes with Vision or hearing, No problems swallowing food or Liquids, + Cough, +  Shortness of Breath, No Abdominal pain, No Nausea or Vomiting, bowel movements are regular, No Blood in stool or Urine, No dysuria, No new skin rashes or bruises, No new joints pains-aches,  No new weakness, tingling, numbness in any extremity, No recent weight gain or loss, No significant Mental Stressors.  A full 10 point Review of Systems was done, except as stated above, all other Review of Systems were negative.   With Past History of the following  :        Past Medical History:  Diagnosis Date  . Anemia 02/11/2012  . Anxiety   . Atrial fibrillation (HCC)   . CHF (congestive heart failure) (HCC)   . Chronic kidney disease    not on dialysis yet, Tues, thurs, sat  . GERD (gastroesophageal reflux disease)   . H/O metabolic acidosis   . Hyperlipidemia   . Hyperparathyroidism, secondary (HCC)   . Hypertension    Dr. Regino Schultze, [place], [place]  . Oxygen dependent   . Vitamin D deficiency            Past Surgical History:  Procedure Laterality Date  . ABDOMINAL HYSTERECTOMY    . AV FISTULA PLACEMENT  04/01/2012   Procedure: ARTERIOVENOUS (AV) FISTULA CREATION;  Surgeon: Sherren Kerns, MD;  Location: [facility] OR;  Service: Vascular;  Laterality: Left;  . CHOLECYSTECTOMY    . COLON SURGERY     for a blockage  . EYE SURGERY     bilateral cataracts, /w IOL - 2013  . FISTULOGRAM N/A 11/05/2014   Procedure: FISTULOGRAM;  Surgeon: Larina Earthly, MD;  Location: [facility] CATH LAB;  Service: Cardiovascular;  Laterality: N/A;  . INSERTION OF DIALYSIS CATHETER  04/10/2012   Procedure: INSERTION OF DIALYSIS CATHETER;  Surgeon: Pryor Ochoa, MD;  Location: [facility] OR;  Service: Vascular;  Laterality: N/A;  Insertion Diatek Catheter Right Internal Jugular  . LIGATION OF COMPETING BRANCHES OF ARTERIOVENOUS FISTULA  11/07/2012   Procedure: LIGATION OF COMPETING BRANCHES OF ARTERIOVENOUS FISTULA;  Surgeon:  Chuck Hint, MD;  Location: [facility] OR;  Service: Vascular;  Laterality: Left;  Brachio/Cephalic Fistula  . SHUNTOGRAM N/A 11/03/2012   Procedure: Betsey Amen;  Surgeon: Chuck Hint, MD;  Location: [facility] CATH LAB;  Service: Cardiovascular;  Laterality: N/A;  . SHUNTOGRAM Left 03/09/2013   Procedure: Fistulogram;  Surgeon: Fransisco Hertz, MD;  Location: [facility] CATH LAB;  Service: Cardiovascular;  Laterality: Left;  . SHUNTOGRAM Left 11/23/2013   Procedure: FISTULOGRAM;  Surgeon: Fransisco Hertz, MD;  Location: [facility] CATH LAB;   Service: Cardiovascular;  Laterality: Left;      Social History:          Social History  Substance Use Topics  . Smoking status: Never Smoker  . Smokeless tobacco: Never Used  . Alcohol use No       Family History :          Family History  Problem Relation Age of Onset  . Stroke Mother   . Cancer Father   . Heart attack Father      Home Medications:          Prior to Admission medications   Medication Sig Start Date End Date Taking? Authorizing Provider  albuterol (PROVENTIL HFA;VENTOLIN HFA) 108 (90 Base) MCG/ACT inhaler Inhale 2 puffs into the lungs every 6 (six) hours as needed for wheezing or shortness of breath. 10/30/16   Erick Blinks, MD  ALPRAZolam Prudy Feeler) 0.5 MG tablet Take 1 tablet by mouth 3 (three) times daily as needed for anxiety. 09/21/16   Historical Provider, MD  Biotin 1 MG CAPS Take 1 mg by mouth daily.    Historical Provider, MD  cinacalcet (SENSIPAR) 30 MG tablet Take 30 mg by mouth at bedtime.    Historical Provider, MD  diltiazem (CARDIZEM CD) 240 MG 24 hr capsule Take 1 capsule (240 mg total) by mouth daily. 11/06/16   Kathlen Mody, MD  guaiFENesin (MUCINEX) 600 MG 12 hr tablet Take 1 tablet (600 mg total) by mouth 2 (two) times daily. 10/30/16   Erick Blinks, MD  ipratropium-albuterol (DUONEB) 0.5-2.5 (3) MG/3ML SOLN Take 3 mLs by nebulization every 6 (six) hours as needed. 11/06/16   Kathlen Mody, MD  lidocaine-prilocaine (EMLA) cream Apply 1 application topically as needed (for IV access).  01/12/13   Historical Provider, MD  multivitamin (RENA-VIT) TABS tablet Take 1 tablet by mouth daily.    Historical Provider, MD  pantoprazole (PROTONIX) 40 MG tablet Take 1 tablet by mouth daily. 09/03/16   Historical Provider, MD  pentoxifylline (TRENTAL) 400 MG CR tablet Take 400 mg by mouth 3 (three) times daily with meals.    Historical Provider, MD  promethazine (PHENERGAN) 25 MG tablet Take 25 mg by mouth every 6  (six) hours as needed for nausea or vomiting.    Historical Provider, MD  sevelamer carbonate (RENVELA) 800 MG tablet Take 2,400 mg by mouth 3 (three) times daily with meals. Take 800mg  with snacks daily.    Historical Provider, MD  simvastatin (ZOCOR) 10 MG tablet Take 10 mg by mouth at bedtime.  02/06/13   Historical Provider, MD  warfarin (COUMADIN) 5 MG tablet Take 1 tablet (5 mg total) by mouth daily at 6 PM. 11/06/16   Kathlen Mody, MD     Allergies:    No Known Allergies   Physical Exam:   Vitals  Blood pressure 133/89, pulse (!) 46, temperature 98.5 F (36.9 C), temperature source Oral, resp. rate 19, height 5\' 4"  (1.626 m), weight 71.7 kg (  158 lb), SpO2 96 %.  1.  General: African american female in no acute distress  2. Psychiatric:  Intact judgement and  insight, awake alert, oriented x 3.  3. Neurologic: No focal neurological deficits, all cranial nerves intact.Strength 5/5 all 4 extremities, sensation intact all 4 extremities, plantars down going.  4. Eyes :  anicteric sclerae, moist conjunctivae with no lid lag. PERRLA.  5. ENMT:  Oropharynx clear with moist mucous membranes and good dentition  6. Neck:  supple, no cervical lymphadenopathy appriciated, No thyromegaly  7. Respiratory : Normal respiratory effort, scattered wheezing bilaterally  8. Cardiovascular : RRR, no gallops, rubs or murmurs, no leg edema  9. Gastrointestinal:  Positive bowel sounds, abdomen soft, non-tender to palpation,no hepatosplenomegaly, no rigidity or guarding       10. Skin:  No cyanosis, normal texture and turgor, no rash, lesions or ulcers  11.Musculoskeletal:  Good muscle tone,  joints appear normal , no effusions,  normal range of motion    Data Review:    CBC  Last Labs    Recent Labs Lab 11/04/16 1626 11/05/16 0630 11/06/16 0609 11/07/16 0559 11/08/16 1904  WBC 12.9* 13.2* 13.1* 14.4* 19.0*  HGB 10.0* 9.9* 10.2* 10.3* 11.9*    HCT 31.1* 30.7* 32.3* 32.2* 37.7  PLT 303 318 382 419* 530*  MCV 95.7 94.8 94.7 93.9 94.7  MCH 30.8 30.6 29.9 30.0 29.9  MCHC 32.2 32.2 31.6 32.0 31.6  RDW 13.6 13.7 13.7 13.7 13.5  LYMPHSABS  --   --   --   --  2.1  MONOABS  --   --   --   --  2.2*  EOSABS  --   --   --   --  0.2  BASOSABS  --   --   --   --  0.1     ------------------------------------------------------------------------------------------------------------------  Chemistries   Last Labs    Recent Labs Lab 11/04/16 0515 11/04/16 1626 11/05/16 0630 11/07/16 0559 11/08/16 1904  NA 131* 130* 130* 132* 134*  K 4.4 4.7 5.0 4.0 3.7  CL 93* 90* 90* 93* 92*  CO2 28 28 26 28 29   GLUCOSE 137* 115* 97 108* 140*  BUN 51* 60* 73* 52* 27*  CREATININE 7.26* 8.26* 9.25* 7.24* 4.49*  CALCIUM 8.3* 8.6* 8.3* 9.2 9.8  AST  --   --   --   --  23  ALT  --   --   --   --  19  ALKPHOS  --   --   --   --  66  BILITOT  --   --   --   --  1.0     ------------------------------------------------------------------------------------------------------------------  ------------------------------------------------------------------------------------------------------------------ GFR: Estimated Creatinine Clearance: 8.9 mL/min (by C-G formula based on SCr of 4.49 mg/dL (H)). Liver Function Tests:  Last Labs    Recent Labs Lab 11/04/16 0515 11/04/16 1626 11/05/16 0630 11/07/16 0559 11/08/16 1904  AST  --   --   --   --  23  ALT  --   --   --   --  19  ALKPHOS  --   --   --   --  66  BILITOT  --   --   --   --  1.0  PROT  --   --   --   --  7.8  ALBUMIN 2.9* 2.8* 2.6* 2.7* 3.2*     Coagulation Profile:  Last Labs    Recent Labs Lab  11/03/16 0440 11/04/16 0515 11/05/16 0832 11/06/16 0609 11/07/16 0559  INR 1.11 1.41 2.16 1.84 2.23     Cardiac Enzymes:  Last Labs    Recent Labs Lab 11/01/16 2340 11/02/16 0515 11/08/16 1904  TROPONINI 0.06* 0.05* 0.06*          Imaging Results:      Imaging Results (Last 48 hours)  Dg Chest Port 1 View  Result Date: 11/08/2016 CLINICAL DATA:  Atrial fibrillation, shortness of breath and chest tightness. Nonproductive cough. EXAM: PORTABLE CHEST 1 VIEW COMPARISON:  11/02/2016 FINDINGS: Cardiomediastinal silhouette is enlarged and globular. Mediastinal contours appear intact. Calcific atherosclerotic disease and tortuosity of the aorta seen. There is no evidence of focal airspace consolidation, pleural effusion or pneumothorax. Mild pulmonary vascular congestion. Osseous structures are without acute abnormality. Soft tissues are grossly normal. IMPRESSION: Enlarged and globular cardiac silhouette. This may represent cardiomegaly or pericardial effusion. Please correlate clinically. Mild pulmonary vascular congestion. Calcific atherosclerotic disease of the aorta. Electronically Signed   By: Ted Mcalpine M.D.   On: 11/08/2016 19:18     My personal review of EKG: Atrial fibrillation with RVR   Assessment & Plan:    Active Problems:   ESRD (end stage renal disease) (HCC)   Benign essential HTN   Atrial fibrillation with RVR (HCC)   1. Atrial fibrillation with RVR- patient will be admitted to stepdown unit, continue Cardizem infusion. Patient is already on anti-coagulation with Coumadin, INR therapeutic at 2.34. CHA2DS2VASc score is 3. I will also add metoprolol 25 mg twice a day, she is she was taking labetalol for a long time, which was stopped recently. We'll consult cardiology in a.m. Will consult pharmacy to manage dosing of Coumadin. 2. ESRD on hemodialysis- patient is on hemodialysis Tuesday Thursday and Saturday, she was dialyzed yesterday and today. Chest x-ray does not show fluid overload, creatinine is 4.49. We'll consult nephrology in a.m. no need for immediate hemodialysis. 3. Leukocytosis- patient has WBC 19,000, she has been afebrile. Chest x-ray shows no pneumonia. Does not appear to be septic, will monitor and  follow CBC in a.m. 4. Hypertension- blood pressure mildly elevated, started on Cardizem infusion. Would also add metoprolol 25 mg by mouth twice a day 5. Hyperlipidemia-continue statin 6. GERD-continue Protonix 7. Bilateral wheezing- patient has scattered wheezing bilaterally on auscultation of lungs. She has been prescribed ipratropium/albuterol as outpatient. No formal diagnosis of COPD, will start ipratropium every 6 hours when necessary. Will avoid albuterol due to A. fib with RVR. We'll start Xopenex nebulizers every 6 hours when necessary for wheezing.    DVT Prophylaxis-   Coumadin  AM Labs Ordered, also please review Full Orders  Family Communication: Admission, patients condition and plan of care including tests being ordered have been discussed with the patient and her daughter at bedside who indicate understanding and agree with the plan and Code Status.  Code Status:  Full code  Admission status: Inpatient   Time spent in minutes : 65 min   Patricia Ayala S M.D on 11/08/2016 at 9:19 PM  Between 7am to 7pm - Pager - [phone]. After 7pm go to [email or web address] - password [facility]  [facility] - Office  [phone]     Routing History

## 2016-11-08 NOTE — Progress Notes (Signed)
Provider: Einar Crow  Location:   [facility] Room Number: 119/P Place of Service:  SNF (31)  PCP: [name]., MD Patient Care Team: [name], MD as PCP - General (Internal Medicine) [name], MD as Attending Physician (Nephrology)  Extended Emergency Contact Information Primary Emergency Contact: Qian,Willie Address: [address]          [place], [place] [address] [place] Home Phone: [phone] Relation: Spouse Secondary Emergency Contact: Gweneth Dimitri, [place] [address] [place] Home Phone: [phone] Relation: Daughter  Code Status: Full Code Goals of Care: Advanced Directive information Advanced Directives 11/08/2016  Does Patient Have a Medical Advance Directive? Yes  Type of Advance Directive (No Data)  Does patient want to make changes to medical advance directive? No - Patient declined  Would patient like information on creating a medical advance directive? No - Patient declined  Pre-existing out of facility DNR order (yellow form or pink MOST form) -      Chief Complaint  Patient presents with  . New Admit To SNF    HPI: Patient is a 83 y.o. female seen today for admission to SNF for therapy. Patient is ESRD due to Hypertensin on Hemodialysis Since 2013, Anemia , and Diastolic CHF She presented to the ER with SOB, cough and was hypoxic in the ED. She was found to be in new onset Atrial fibrillation. She was started on Cardizem and Coumadin. Echo in 11/17 had shown EF of 60% with LVH. Her Previous meds Norvasc and Labetalol were stopped and she was changed to Cardizem. Her rate is controlled on Oral Meds. Her INR is therapeutic on Coumadin. Patient was also wheezing and hypoxic. It seems she got Antibiotics for few days.  Her C Xray in the hospital was negative. Patient lives with her husband at home has very supportive family around. She says she wants to get better and go  home. She is already able to walk with the walker. Patient went for Dialysis today But continue to have productive sputum. Also having some SOB and Wheezing. Denies any Chest pain.  Past Medical History:  Diagnosis Date  . Anemia 02/11/2012  . Anxiety   . Chronic kidney disease    not on dialysis yet, Tues, thurs, sat  . H/O metabolic acidosis   . Hyperparathyroidism, secondary (HCC)   . Hypertension    Dr. Regino Schultze, [place], [place]  . Vitamin D deficiency    Past Surgical History:  Procedure Laterality Date  . ABDOMINAL HYSTERECTOMY    . AV FISTULA PLACEMENT  04/01/2012   Procedure: ARTERIOVENOUS (AV) FISTULA CREATION;  Surgeon: Sherren Kerns, MD;  Location: [facility] OR;  Service: Vascular;  Laterality: Left;  . CHOLECYSTECTOMY    . COLON SURGERY     for a blockage  . EYE SURGERY     bilateral cataracts, /w IOL - 2013  . FISTULOGRAM N/A 11/05/2014   Procedure: FISTULOGRAM;  Surgeon: Larina Earthly, MD;  Location: [facility] CATH LAB;  Service: Cardiovascular;  Laterality: N/A;  . INSERTION OF DIALYSIS CATHETER  04/10/2012   Procedure: INSERTION OF DIALYSIS CATHETER;  Surgeon: Pryor Ochoa, MD;  Location: [facility] OR;  Service: Vascular;  Laterality: N/A;  Insertion Diatek Catheter Right Internal Jugular  . LIGATION OF COMPETING BRANCHES OF ARTERIOVENOUS FISTULA  11/07/2012   Procedure: LIGATION OF COMPETING BRANCHES OF ARTERIOVENOUS FISTULA;  Surgeon: Chuck Hint, MD;  Location: [facility] OR;  Service: Vascular;  Laterality: Left;  Brachio/Cephalic Fistula  . SHUNTOGRAM N/A 11/03/2012   Procedure: Betsey Amen;  Surgeon: Chuck Hint, MD;  Location: [facility] CATH LAB;  Service: Cardiovascular;  Laterality: N/A;  . SHUNTOGRAM Left 03/09/2013   Procedure: Fistulogram;  Surgeon: Fransisco Hertz, MD;  Location: [facility] CATH LAB;  Service: Cardiovascular;  Laterality: Left;  . SHUNTOGRAM Left 11/23/2013   Procedure: FISTULOGRAM;  Surgeon: Fransisco Hertz, MD;  Location: [facility] CATH LAB;  Service: Cardiovascular;  Laterality:  Left;    reports that she has never smoked. She has never used smokeless tobacco. She reports that she does not drink alcohol or use drugs. Social History   Social History  . Marital status: Married    Spouse name: N/A  . Number of children: N/A  . Years of education: N/A   Occupational History  . Not on file.   Social History Main Topics  . Smoking status: Never Smoker  . Smokeless tobacco: Never Used  . Alcohol use No  . Drug use: No  . Sexual activity: Not on file   Other Topics Concern  . Not on file   Social History Narrative  . No narrative on file    Functional Status Survey:    Family History  Problem Relation Age of Onset  . Stroke Mother   . Cancer Father   . Heart attack Father     Health Maintenance  Topic Date Due  . TETANUS/TDAP  11/21/1949  . ZOSTAVAX  11/21/1990  . DEXA SCAN  11/22/1995  . PNA vac Low Risk Adult (2 of 2 - PCV13) 04/10/2017  . INFLUENZA VACCINE  Completed    No Known Allergies  Allergies as of 11/08/2016   No Known Allergies     Medication List       Accurate as of 11/08/16  4:11 PM. Always use your most recent med list.          albuterol 108 (90 Base) MCG/ACT inhaler Commonly known as:  PROVENTIL HFA;VENTOLIN HFA Inhale 2 puffs into the lungs every 6 (six) hours as needed for wheezing or shortness of breath.   ALPRAZolam 0.5 MG tablet Commonly known as:  XANAX Take 1 tablet by mouth 3 (three) times daily as needed for anxiety.   Biotin 1 MG Caps Take 1 mg by mouth daily.   cinacalcet 30 MG tablet Commonly known as:  SENSIPAR Take 30 mg by mouth at bedtime.   diltiazem 240 MG 24 hr capsule Commonly known as:  CARDIZEM CD Take 1 capsule (240 mg total) by mouth daily.   guaiFENesin 600 MG 12 hr tablet Commonly known as:  MUCINEX Take 1 tablet (600 mg total) by mouth 2 (two) times daily.   ipratropium-albuterol 0.5-2.5 (3) MG/3ML Soln Commonly known as:  DUONEB Take 3 mLs by nebulization every 6 (six)  hours as needed.   lidocaine-prilocaine cream Commonly known as:  EMLA Apply 1 application topically as needed (for IV access).   multivitamin Tabs tablet Take 1 tablet by mouth daily.   pantoprazole 40 MG tablet Commonly known as:  PROTONIX Take 1 tablet by mouth daily.   pentoxifylline 400 MG CR tablet Commonly known as:  TRENTAL Take 400 mg by mouth 3 (three) times daily with meals.   promethazine 25 MG tablet Commonly known as:  PHENERGAN Take 25 mg by mouth every 6 (six) hours as needed for nausea or vomiting.   sevelamer carbonate 800 MG tablet Commonly known as:  RENVELA Take  2,400 mg by mouth 3 (three) times daily with meals. Take 800mg  with snacks daily.   simvastatin 10 MG tablet Commonly known as:  ZOCOR Take 10 mg by mouth at bedtime.   warfarin 5 MG tablet Commonly known as:  COUMADIN Take 1 tablet (5 mg total) by mouth daily at 6 PM.       Review of Systems  Constitutional: Positive for activity change. Negative for appetite change, chills, fatigue and fever.  HENT: Negative for congestion, sore throat and tinnitus.   Respiratory: Positive for cough, shortness of breath and wheezing. Negative for apnea.   Cardiovascular: Negative for chest pain, palpitations and leg swelling.  Gastrointestinal: Negative for abdominal distention, abdominal pain, constipation, diarrhea and nausea.  Genitourinary: Negative for dysuria and frequency.  Musculoskeletal: Negative.   Skin: Negative for color change, pallor and rash.  Neurological: Positive for weakness. Negative for dizziness and light-headedness.  Psychiatric/Behavioral: Negative for agitation, confusion, decreased concentration and sleep disturbance. The patient is not nervous/anxious and is not hyperactive.     Vitals:   11/08/16 1556  BP: 125/67  Pulse: (!) 129  Resp: (!) 22  Temp: 97.1 F (36.2 C)  TempSrc: Oral  SpO2: 95%   There is no height or weight on file to calculate BMI. Physical Exam    Constitutional: She is oriented to person, place, and time. She appears well-developed and well-nourished.  HENT:  Head: Normocephalic.  Mouth/Throat: Oropharynx is clear and moist.  Eyes: Pupils are equal, round, and reactive to light.  Neck: Neck supple.  Cardiovascular: Normal heart sounds.  An irregularly irregular rhythm present.  No murmur heard. Pulmonary/Chest: Effort normal. She has wheezes.  Bilateral Wheezing.  Abdominal: Soft. Bowel sounds are normal. She exhibits no distension. There is no tenderness. There is no rebound.  Musculoskeletal: She exhibits no edema.  Lymphadenopathy:    She has no cervical adenopathy.  Neurological: She is alert and oriented to person, place, and time.  Had good strength in all extremities  Skin: Skin is warm and dry.  Psychiatric: She has a normal mood and affect. Her behavior is normal. Thought content normal.    Labs reviewed: Basic Metabolic Panel:  Recent Labs  16/10/96 1626 11/05/16 0630 11/07/16 0559  NA 130* 130* 132*  K 4.7 5.0 4.0  CL 90* 90* 93*  CO2 28 26 28   GLUCOSE 115* 97 108*  BUN 60* 73* 52*  CREATININE 8.26* 9.25* 7.24*  CALCIUM 8.6* 8.3* 9.2  PHOS 5.5* 6.5* 4.4   Liver Function Tests:  Recent Labs  09/27/16 0419  11/01/16 0655  11/04/16 1626 11/05/16 0630 11/07/16 0559  AST 17  --  28  --   --   --   --   ALT 16  --  33  --   --   --   --   ALKPHOS 42  --  66  --   --   --   --   BILITOT 0.5  --  1.3*  --   --   --   --   PROT 6.4*  --  7.0  --   --   --   --   ALBUMIN 3.3*  < > 3.3*  < > 2.8* 2.6* 2.7*  < > = values in this interval not displayed. No results for input(s): LIPASE, AMYLASE in the last 8760 hours. No results for input(s): AMMONIA in the last 8760 hours. CBC:  Recent Labs  09/26/16 0629  10/29/16 0740  11/01/16 0655  11/05/16 0630 11/06/16 0609 11/07/16 0559  WBC 10.7*  < > 12.8*  < > 12.6*  < > 13.2* 13.1* 14.4*  NEUTROABS 7.9*  --  10.1*  --  10.5*  --   --   --   --    HGB 10.7*  < > 10.6*  < > 10.6*  < > 9.9* 10.2* 10.3*  HCT 34.7*  < > 33.8*  < > 33.8*  < > 30.7* 32.3* 32.2*  MCV 98.0  < > 97.4  < > 96.0  < > 94.8 94.7 93.9  PLT 270  < > 247  < > 216  < > 318 382 419*  < > = values in this interval not displayed. Cardiac Enzymes:  Recent Labs  11/01/16 1110 11/01/16 2340 11/02/16 0515  TROPONINI 0.06* 0.06* 0.05*   BNP: Invalid input(s): POCBNP No results found for: HGBA1C No results found for: TSH No results found for: VITAMINB12 No results found for: FOLATE No results found for: IRON, TIBC, FERRITIN  Imaging and Procedures obtained prior to SNF admission: No results found.  Assessment/Plan  Patients Pulse rate was very High in the facility. EKG was done and it showed HR of 137 with Rapid Ventricular response.Patient only complains of SOB and Cough.  D/W the Family will send her to the ED for further Evaluation.   Family/ staff Communication:   Labs/tests ordered:

## 2016-11-08 NOTE — ED Triage Notes (Signed)
[facility] staff reports pt was admitted to them yesterday and reports has history of ESRD, respiratory failure, and afib.  Today HR 132 and in afib.  Nurse says pt had 6pm meds.  Pt reports sob that started on the way from [facility] center to here and worsening chest tightness.

## 2016-11-08 NOTE — Progress Notes (Signed)
ANTICOAGULATION CONSULT NOTE - Initial Consult  Pharmacy Consult for Coumadin Indication: atrial fibrillation  No Known Allergies  Patient Measurements: Height: 5\' 4"  (162.6 cm) Weight: 158 lb (71.7 kg) IBW/kg (Calculated) : 54.7 Heparin Dosing Weight:   Vital Signs: Temp: 98.5 F (36.9 C) (01/11 1833) Temp Source: Oral (01/11 1833) BP: 156/122 (01/11 2135) Pulse Rate: 142 (01/11 2135)  Labs:  Recent Labs  11/06/16 0609 11/07/16 0559 11/08/16 1904 11/08/16 1923  HGB 10.2* 10.3* 11.9*  --   HCT 32.3* 32.2* 37.7  --   PLT 382 419* 530*  --   LABPROT 21.5* 25.1*  --  26.1*  INR 1.84 2.23  --  2.34  HEPARINUNFRC 0.40 <0.10*  --   --   CREATININE  --  7.24* 4.49*  --   TROPONINI  --   --  0.06*  --     Estimated Creatinine Clearance: 8.9 mL/min (by C-G formula based on SCr of 4.49 mg/dL (H)).   Medical History: Past Medical History:  Diagnosis Date  . Anemia 02/11/2012  . Anxiety   . Atrial fibrillation (HCC)   . CHF (congestive heart failure) (HCC)   . Chronic kidney disease    not on dialysis yet, Tues, thurs, sat  . GERD (gastroesophageal reflux disease)   . H/O metabolic acidosis   . Hyperlipidemia   . Hyperparathyroidism, secondary (HCC)   . Hypertension    Dr. Regino Schultze, [place], [place]  . Oxygen dependent   . Vitamin D deficiency     Medications:  Assessment: Continuation of Coumadin PTA for AFIB INR therapeutic on admission Per RN note patient received 6 PM medications   Goal of Therapy:  INR 2-3 Monitor platelets by anticoagulation protocol: Yes   Plan:  No Coumadin tonight (already given today) INR/PT daily CBC dally  Raquel James, Ronika Kelson Bennett 11/08/2016,9:42 PM

## 2016-11-08 NOTE — H&P (Signed)
TRH H&P    Patient Demographics:    Patricia Ayala, is a 83 y.o. female  MRN: [id number]  DOB - 1928/11/08  Admit Date - 11/08/2016  Referring MD/NP/PA: Dr Hyacinth Meeker  Outpatient Primary MD for the patient is [name]., MD  Patient coming from: SNF  Chief Complaint  Patient presents with  . Atrial Fibrillation      HPI:    Patricia Ayala  is a 83 y.o. female, With history of ESRD on hemodialysis Tuesday Thursday and Saturday, hypertension who was recently admitted to the hospital for atrial fibrillation with RVR and discharged to skilled facility on 11/07/2016. Patient underwent physical therapy and last 24 hours and it was noted that patient had shortness of breath and chest discomfort. Patient was found to be in atrial fibrillation with RVR with heart rate 150s and she was sent back to hospital for further evaluation. In the ED patient started on Cardizem drip 10 mg per hour, and heart rate is still in 140s.  It is noted that patient was on labetalol 200 mg twice a day prior to admission on 11/01/2016, which was stopped during hospitalization and patient discharged on Cardizem.   She denies nausea, no vomiting. No diarrhea. No headache or blurred vision.    Review of systems:    In addition to the HPI above,  No Fever-chills, No Headache, No changes with Vision or hearing, No problems swallowing food or Liquids, + Cough, +  Shortness of Breath, No Abdominal pain, No Nausea or Vomiting, bowel movements are regular, No Blood in stool or Urine, No dysuria, No new skin rashes or bruises, No new joints pains-aches,  No new weakness, tingling, numbness in any extremity, No recent weight gain or loss, No significant Mental Stressors.  A full 10 point Review of Systems was done, except as stated above, all other Review of Systems were negative.   With Past History of the following :    Past  Medical History:  Diagnosis Date  . Anemia 02/11/2012  . Anxiety   . Atrial fibrillation (HCC)   . CHF (congestive heart failure) (HCC)   . Chronic kidney disease    not on dialysis yet, Tues, thurs, sat  . GERD (gastroesophageal reflux disease)   . H/O metabolic acidosis   . Hyperlipidemia   . Hyperparathyroidism, secondary (HCC)   . Hypertension    Dr. Regino Schultze, [place], [place]  . Oxygen dependent   . Vitamin D deficiency       Past Surgical History:  Procedure Laterality Date  . ABDOMINAL HYSTERECTOMY    . AV FISTULA PLACEMENT  04/01/2012   Procedure: ARTERIOVENOUS (AV) FISTULA CREATION;  Surgeon: Sherren Kerns, MD;  Location: [facility] OR;  Service: Vascular;  Laterality: Left;  . CHOLECYSTECTOMY    . COLON SURGERY     for a blockage  . EYE SURGERY     bilateral cataracts, /w IOL - 2013  . FISTULOGRAM N/A 11/05/2014   Procedure: FISTULOGRAM;  Surgeon: Larina Earthly, MD;  Location: [facility] CATH LAB;  Service: Cardiovascular;  Laterality: N/A;  . INSERTION OF DIALYSIS CATHETER  04/10/2012   Procedure: INSERTION OF DIALYSIS CATHETER;  Surgeon: Pryor Ochoa, MD;  Location: [facility] OR;  Service: Vascular;  Laterality: N/A;  Insertion Diatek Catheter Right Internal Jugular  . LIGATION OF COMPETING BRANCHES OF ARTERIOVENOUS FISTULA  11/07/2012   Procedure: LIGATION OF COMPETING BRANCHES OF ARTERIOVENOUS FISTULA;  Surgeon: Chuck Hint, MD;  Location: [facility] OR;  Service: Vascular;  Laterality: Left;  Brachio/Cephalic Fistula  . SHUNTOGRAM N/A 11/03/2012   Procedure: Betsey Amen;  Surgeon: Chuck Hint, MD;  Location: [facility] C[facility] LAB;  Service: Cardiovascular;  Laterality: N/A;  . SHUNTOGRAM Left 03/09/2013   Procedure: Fistulogram;  Surgeon: Fransisco Hertz, MD;  Location[facility] C[facility] LAB;  Service: Cardiovascular;  Laterality: Left;  . SHUNTOGRAM Left 11/23/2013   Procedure: FISTULOGRAM;  Surgeon: Fransisco Hertz, MD;  Location: [facility] CATH LAB;  Service: Cardiovascular;  Laterality: Left;      Social  History:      Social History  Substance Use Topics  . Smoking status: Never Smoker  . Smokeless tobacco: Never Used  . Alcohol use No       Family History :     Family History  Problem Relation Age of Onset  . Stroke Mother   . Cancer Father   . Heart attack Father      Home Medications:   Prior to Admission medications   Medication Sig Start Date End Date Taking? Authorizing Provider  albuterol (PROVENTIL HFA;VENTOLIN HFA) 108 (90 Base) MCG/ACT inhaler Inhale 2 puffs into the lungs every 6 (six) hours as needed for wheezing or shortness of breath. 10/30/16   Erick Blinks, MD  ALPRAZolam Prudy Feeler) 0.5 MG tablet Take 1 tablet by mouth 3 (three) times daily as needed for anxiety. 09/21/16   Historical Provider, MD  Biotin 1 MG CAPS Take 1 mg by mouth daily.    Historical Provider, MD  cinacalcet (SENSIPAR) 30 MG tablet Take 30 mg by mouth at bedtime.    Historical Provider, MD  diltiazem (CARDIZEM CD) 240 MG 24 hr capsule Take 1 capsule (240 mg total) by mouth daily. 11/06/16   Kathlen Mody, MD  guaiFENesin (MUCINEX) 600 MG 12 hr tablet Take 1 tablet (600 mg total) by mouth 2 (two) times daily. 10/30/16   Erick Blinks, MD  ipratropium-albuterol (DUONEB) 0.5-2.5 (3) MG/3ML SOLN Take 3 mLs by nebulization every 6 (six) hours as needed. 11/06/16   Kathlen Mody, MD  lidocaine-prilocaine (EMLA) cream Apply 1 application topically as needed (for IV access).  01/12/13   Historical Provider, MD  multivitamin (RENA-VIT) TABS tablet Take 1 tablet by mouth daily.    Historical Provider, MD  pantoprazole (PROTONIX) 40 MG tablet Take 1 tablet by mouth daily. 09/03/16   Historical Provider, MD  pentoxifylline (TRENTAL) 400 MG CR tablet Take 400 mg by mouth 3 (three) times daily with meals.    Historical Provider, MD  promethazine (PHENERGAN) 25 MG tablet Take 25 mg by mouth every 6 (six) hours as needed for nausea or vomiting.    Historical Provider, MD  sevelamer carbonate (RENVELA) 800 MG tablet Take  2,400 mg by mouth 3 (three) times daily with meals. Take 800mg  with snacks daily.    Historical Provider, MD  simvastatin (ZOCOR) 10 MG tablet Take 10 mg by mouth at bedtime.  02/06/13   Historical Provider, MD  warfarin (COUMADIN) 5 MG tablet Take 1 tablet (5 mg total) by mouth daily at 6 PM. 11/06/16  Kathlen Mody, MD     Allergies:    No Known Allergies   Physical Exam:   Vitals  Blood pressure 133/89, pulse (!) 46, temperature 98.5 F (36.9 C), temperature source Oral, resp. rate 19, height 5\' 4"  (1.626 m), weight 71.7 kg (158 lb), SpO2 96 %.  1.  General: African american female in no acute distress  2. Psychiatric:  Intact judgement and  insight, awake alert, oriented x 3.  3. Neurologic: No focal neurological deficits, all cranial nerves intact.Strength 5/5 all 4 extremities, sensation intact all 4 extremities, plantars down going.  4. Eyes :  anicteric sclerae, moist conjunctivae with no lid lag. PERRLA.  5. ENMT:  Oropharynx clear with moist mucous membranes and good dentition  6. Neck:  supple, no cervical lymphadenopathy appriciated, No thyromegaly  7. Respiratory : Normal respiratory effort, scattered wheezing bilaterally  8. Cardiovascular : RRR, no gallops, rubs or murmurs, no leg edema  9. Gastrointestinal:  Positive bowel sounds, abdomen soft, non-tender to palpation,no hepatosplenomegaly, no rigidity or guarding       10. Skin:  No cyanosis, normal texture and turgor, no rash, lesions or ulcers  11.Musculoskeletal:  Good muscle tone,  joints appear normal , no effusions,  normal range of motion    Data Review:    CBC  Recent Labs Lab 11/04/16 1626 11/05/16 0630 11/06/16 0609 11/07/16 0559 11/08/16 1904  WBC 12.9* 13.2* 13.1* 14.4* 19.0*  HGB 10.0* 9.9* 10.2* 10.3* 11.9*  HCT 31.1* 30.7* 32.3* 32.2* 37.7  PLT 303 318 382 419* 530*  MCV 95.7 94.8 94.7 93.9 94.7  MCH 30.8 30.6 29.9 30.0 29.9  MCHC 32.2 32.2 31.6 32.0 31.6  RDW 13.6 13.7  13.7 13.7 13.5  LYMPHSABS  --   --   --   --  2.1  MONOABS  --   --   --   --  2.2*  EOSABS  --   --   --   --  0.2  BASOSABS  --   --   --   --  0.1   ------------------------------------------------------------------------------------------------------------------  Chemistries   Recent Labs Lab 11/04/16 0515 11/04/16 1626 11/05/16 0630 11/07/16 0559 11/08/16 1904  NA 131* 130* 130* 132* 134*  K 4.4 4.7 5.0 4.0 3.7  CL 93* 90* 90* 93* 92*  CO2 28 28 26 28 29   GLUCOSE 137* 115* 97 108* 140*  BUN 51* 60* 73* 52* 27*  CREATININE 7.26* 8.26* 9.25* 7.24* 4.49*  CALCIUM 8.3* 8.6* 8.3* 9.2 9.8  AST  --   --   --   --  23  ALT  --   --   --   --  19  ALKPHOS  --   --   --   --  66  BILITOT  --   --   --   --  1.0   ------------------------------------------------------------------------------------------------------------------  ------------------------------------------------------------------------------------------------------------------ GFR: Estimated Creatinine Clearance: 8.9 mL/min (by C-G formula based on SCr of 4.49 mg/dL (H)). Liver Function Tests:  Recent Labs Lab 11/04/16 0515 11/04/16 1626 11/05/16 0630 11/07/16 0559 11/08/16 1904  AST  --   --   --   --  23  ALT  --   --   --   --  19  ALKPHOS  --   --   --   --  66  BILITOT  --   --   --   --  1.0  PROT  --   --   --   --  7.8  ALBUMIN 2.9* 2.8* 2.6* 2.7* 3.2*   Coagulation Profile:  Recent Labs Lab 11/03/16 0440 11/04/16 0515 11/05/16 0832 11/06/16 0609 11/07/16 0559  INR 1.11 1.41 2.16 1.84 2.23   Cardiac Enzymes:  Recent Labs Lab 11/01/16 2340 11/02/16 0515 11/08/16 1904  TROPONINI 0.06* 0.05* 0.06*        Imaging Results:    Dg Chest Port 1 View  Result Date: 11/08/2016 CLINICAL DATA:  Atrial fibrillation, shortness of breath and chest tightness. Nonproductive cough. EXAM: PORTABLE CHEST 1 VIEW COMPARISON:  11/02/2016 FINDINGS: Cardiomediastinal silhouette is enlarged and  globular. Mediastinal contours appear intact. Calcific atherosclerotic disease and tortuosity of the aorta seen. There is no evidence of focal airspace consolidation, pleural effusion or pneumothorax. Mild pulmonary vascular congestion. Osseous structures are without acute abnormality. Soft tissues are grossly normal. IMPRESSION: Enlarged and globular cardiac silhouette. This may represent cardiomegaly or pericardial effusion. Please correlate clinically. Mild pulmonary vascular congestion. Calcific atherosclerotic disease of the aorta. Electronically Signed   By: Ted Mcalpine M.D.   On: 11/08/2016 19:18    My personal review of EKG: Atrial fibrillation with RVR   Assessment & Plan:    Active Problems:   ESRD (end stage renal disease) (HCC)   Benign essential HTN   Atrial fibrillation with RVR (HCC)   1. Atrial fibrillation with RVR- patient will be admitted to stepdown unit, continue Cardizem infusion. Patient is already on anti-coagulation with Coumadin, INR therapeutic at 2.34. CHA2DS2VASc score is 3. I will also add metoprolol 25 mg twice a day, she is she was taking labetalol for a long time, which was stopped recently. We'll consult cardiology in a.m. Will consult pharmacy to manage dosing of Coumadin. 2. ESRD on hemodialysis- patient is on hemodialysis Tuesday Thursday and Saturday, she was dialyzed yesterday and today. Chest x-ray does not show fluid overload, creatinine is 4.49. We'll consult nephrology in a.m. no need for immediate hemodialysis. 3. Leukocytosis- patient has WBC 19,000, she has been afebrile. Chest x-ray shows no pneumonia. Does not appear to be septic, will monitor and follow CBC in a.m. 4. Hypertension- blood pressure mildly elevated, started on Cardizem infusion. Would also add metoprolol 25 mg by mouth twice a day 5. Hyperlipidemia-continue statin 6. GERD-continue Protonix 7. Bilateral wheezing- patient has scattered wheezing bilaterally on auscultation of  lungs. She has been prescribed ipratropium/albuterol as outpatient. No formal diagnosis of COPD, will start ipratropium every 6 hours when necessary. Will avoid albuterol due to A. fib with RVR. We'll start Xopenex nebulizers every 6 hours when necessary for wheezing.    DVT Prophylaxis-   Coumadin  AM Labs Ordered, also please review Full Orders  Family Communication: Admission, patients condition and plan of care including tests being ordered have been discussed with the patient and her daughter at bedside who indicate understanding and agree with the plan and Code Status.  Code Status:  Full code  Admission status: Inpatient   Time spent in minutes : 65 min   Zareen Jamison S M.D on 11/08/2016 at 9:19 PM  Between 7am to 7pm - Pager - [phone]. After 7pm go to [email or web address] - password [email or web address]  [facility] - Office  [phone]

## 2016-11-09 ENCOUNTER — Inpatient Hospital Stay (HOSPITAL_COMMUNITY): Payer: Medicare Other

## 2016-11-09 DIAGNOSIS — I313 Pericardial effusion (noninflammatory): Secondary | ICD-10-CM

## 2016-11-09 DIAGNOSIS — J90 Pleural effusion, not elsewhere classified: Secondary | ICD-10-CM

## 2016-11-09 DIAGNOSIS — I4891 Unspecified atrial fibrillation: Secondary | ICD-10-CM

## 2016-11-09 LAB — TROPONIN I
TROPONIN I: 0.04 ng/mL — AB (ref <0.03)
Troponin I: 0.05 ng/mL (ref <0.03)

## 2016-11-09 LAB — CBC
HEMATOCRIT: 34.2 % — AB (ref 36.0–46.0)
Hemoglobin: 10.8 g/dL — ABNORMAL LOW (ref 12.0–15.0)
MCH: 30.1 pg (ref 26.0–34.0)
MCHC: 31.6 g/dL (ref 30.0–36.0)
MCV: 95.3 fL (ref 78.0–100.0)
PLATELETS: 444 10*3/uL — AB (ref 150–400)
RBC: 3.59 MIL/uL — AB (ref 3.87–5.11)
RDW: 13.7 % (ref 11.5–15.5)
WBC: 16 10*3/uL — AB (ref 4.0–10.5)

## 2016-11-09 LAB — COMPREHENSIVE METABOLIC PANEL
ALBUMIN: 2.7 g/dL — AB (ref 3.5–5.0)
ALT: 17 U/L (ref 14–54)
AST: 18 U/L (ref 15–41)
Alkaline Phosphatase: 53 U/L (ref 38–126)
Anion gap: 10 (ref 5–15)
BUN: 33 mg/dL — AB (ref 6–20)
CHLORIDE: 95 mmol/L — AB (ref 101–111)
CO2: 29 mmol/L (ref 22–32)
CREATININE: 5.33 mg/dL — AB (ref 0.44–1.00)
Calcium: 9.2 mg/dL (ref 8.9–10.3)
GFR calc Af Amer: 8 mL/min — ABNORMAL LOW (ref >60)
GFR, EST NON AFRICAN AMERICAN: 7 mL/min — AB (ref >60)
GLUCOSE: 111 mg/dL — AB (ref 65–99)
POTASSIUM: 3.9 mmol/L (ref 3.5–5.1)
Sodium: 134 mmol/L — ABNORMAL LOW (ref 135–145)
Total Bilirubin: 1 mg/dL (ref 0.3–1.2)
Total Protein: 6.4 g/dL — ABNORMAL LOW (ref 6.5–8.1)

## 2016-11-09 LAB — ECHOCARDIOGRAM LIMITED
HEIGHTINCHES: 64 in
WEIGHTICAEL: 2472.68 [oz_av]

## 2016-11-09 LAB — PROTIME-INR
INR: 2.77
Prothrombin Time: 29.8 seconds — ABNORMAL HIGH (ref 11.4–15.2)

## 2016-11-09 LAB — TSH: TSH: 0.836 u[IU]/mL (ref 0.350–4.500)

## 2016-11-09 MED ORDER — WARFARIN SODIUM 5 MG PO TABS
5.0000 mg | ORAL_TABLET | Freq: Once | ORAL | Status: AC
  Administered 2016-11-09: 5 mg via ORAL
  Filled 2016-11-09: qty 1

## 2016-11-09 MED ORDER — DILTIAZEM HCL 60 MG PO TABS
60.0000 mg | ORAL_TABLET | Freq: Four times a day (QID) | ORAL | Status: DC
  Administered 2016-11-09 – 2016-11-10 (×4): 60 mg via ORAL
  Filled 2016-11-09 (×4): qty 1

## 2016-11-09 MED ORDER — LEVALBUTEROL HCL 1.25 MG/0.5ML IN NEBU
1.2500 mg | INHALATION_SOLUTION | Freq: Four times a day (QID) | RESPIRATORY_TRACT | Status: DC | PRN
  Administered 2016-11-09 (×2): 1.25 mg via RESPIRATORY_TRACT
  Filled 2016-11-09 (×2): qty 0.5

## 2016-11-09 NOTE — Progress Notes (Signed)
*  PRELIMINARY RESULTS* Echocardiogram A Limited 2-D Echocardiogram has been performed.  Stacey Drain 11/09/2016, 2:11 PM

## 2016-11-09 NOTE — Consult Note (Signed)
Patricia Ayala MRN: [id number] DOB/AGE: Jan 12, 1929 83 y.o. Primary Care Physician:FUSCO,LAWRENCE J., MD Admit date: 11/08/2016 Chief Complaint:  Chief Complaint  Patient presents with  . Atrial Fibrillation   HPI: Pt is a 83 year old  female with a past medical history of ESRD who came to the ER with  C/o  shortness of breath .  HPI dates back 11/07/16 when pt was admitted with Atrila fibreillation.Pt was started on Cardizem stablized and later dischrged to Nursing home.  Last evening when pt was sent from Nursing home with c/o shortness of breath. Upon evlauation in ER pt was again found to be in Afib with RVR. Pt was stared on both Calcium and beta blockers and admitted for further care. Pt is on hemodialysis on Tues/Thursday.Saturday schedule so Nephrology was consulted. Pt seen today in ICU Pt offers no c/o nausea/vomiting NO c/o fever/cough and chills Pt says " I had dialysis yesterday"    Past Medical History:  Diagnosis Date  . Anemia 02/11/2012  . Anxiety   . Atrial fibrillation (HCC)   . CHF (congestive heart failure) (HCC)   . Chronic kidney disease    not on dialysis yet, Tues, thurs, sat  . GERD (gastroesophageal reflux disease)   . H/O metabolic acidosis   . Hyperlipidemia   . Hyperparathyroidism, secondary (HCC)   . Hypertension    Dr. Regino Schultze, [place], [place]  . Oxygen dependent   . Vitamin D deficiency         Family History  Problem Relation Age of Onset  . Stroke Mother   . Cancer Father   . Heart attack Father   NO hx of ESRD  Social History:  reports that she has never smoked. She has never used smokeless tobacco. She reports that she does not drink alcohol or use drugs.   Allergies: No Known Allergies    WUJ:WJXBJ from the symptoms mentioned above,there are no other symptoms referable to all systems reviewed.  Physical Exam: Vital signs in last 24 hours: Temp:  [97.1 F (36.2 C)-98.8 F (37.1 C)] 98.8 F (37.1 C) (01/11 2209) Pulse  Rate:  [39-154] 142 (01/11 2135) Resp:  [15-23] 23 (01/11 2133) BP: (125-156)/(67-122) 156/122 (01/11 2135) SpO2:  [94 %-97 %] 95 % (01/11 2133) Weight:  [154 lb 8.7 oz (70.1 kg)-158 lb (71.7 kg)] 154 lb 8.7 oz (70.1 kg) (01/11 2209) Weight change:  Last BM Date: 11/08/16  Intake/Output from previous day: No intake/output data recorded. No intake/output data recorded.   Physical Exam: General- pt is awake,alert, oriented to time place and person Resp- No acute REsp distress, Rhonchi+ CVS- S1S2 regular in rate and rhythm GIT- BS+, soft, NT, ND EXT- trace LE Edema, NO Cyanosis CNS- CN 2-12 grossly intact. Moving all 4 extremities Psych- normal mood and affect Access- AVF +   Lab Results: CBC  Recent Labs  11/07/16 0559 11/08/16 1904  WBC 14.4* 19.0*  HGB 10.3* 11.9*  HCT 32.2* 37.7  PLT 419* 530*    BMET  Recent Labs  11/07/16 0559 11/08/16 1904  NA 132* 134*  K 4.0 3.7  CL 93* 92*  CO2 28 29  GLUCOSE 108* 140*  BUN 52* 27*  CREATININE 7.24* 4.49*  CALCIUM 9.2 9.8    MICRO Recent Results (from the past 240 hour(s))  Blood Culture (routine x 2)     Status: None   Collection Time: 11/01/16  6:55 AM  Result Value Ref Range Status   Specimen Description BLOOD RIGHT HAND  Final   Special Requests BOTTLES DRAWN AEROBIC AND ANAEROBIC 6CC  Final   Culture NO GROWTH 5 DAYS  Final   Report Status 11/06/2016 FINAL  Final  Blood Culture (routine x 2)     Status: None   Collection Time: 11/01/16  7:01 AM  Result Value Ref Range Status   Specimen Description BLOOD RIGHT HAND  Final   Special Requests BOTTLES DRAWN AEROBIC ONLY 4CC  Final   Culture NO GROWTH 5 DAYS  Final   Report Status 11/06/2016 FINAL  Final      Lab Results  Component Value Date   CALCIUM 9.8 11/08/2016   CAION 1.07 (L) 11/05/2014   PHOS 4.4 11/07/2016   CXR showed-Congestive heart failure similar in appearance to the most recent prior chest radiograph.  Impression: 1)Renal  ESRD  on HD                Pt is on Tue/Thurs/saturday schedule                Pt was dialyzed on Thursday.                NO need to be dialyzed today   2)HTN BP not at goal  Medication Calcium blockers Beta blockers   3)Anemia In ESRD the goal for HGb is 9--11. Hgb at goal NO need of epo if hgb stays more than 11.5  4)CKD Mineral-Bone Disorder Phosphorus is at goal     On binders. Calcium is  at goal.  5)CHF- Pt admitted with pulmonary edema PMD following  6)Electrolytes  Normokalemic  Hyponatremic    Sec to ESRD  7)Acid base Co2 at goal     Plan:  Agree with current tx and plan NO need of HD today Will dialyze in am.  Jheremy Boger S 11/09/2016, 4:59 AM

## 2016-11-09 NOTE — Consult Note (Signed)
CARDIOLOGY CONSULT NOTE   Patient ID: [name] MRN: [id number] DOB/AGE: 1929-06-19 83 y.o.  Admit Date: 11/08/2016 Referring Physician: Lamar Blinks MD Primary Physician: Patricia Ayala., MD Consulting Cardiologist: Patricia Docker MD Primary Cardiologist: Patricia Rich MD Reason for Consultation: Atrial fib with RVR.   Clinical Summary Patricia Ayala is a 83 y.o.female with recent discharge from [facility] in the setting of atrial fib with RVR, ESRD on dialysis, hypertension, who was discharged to SNF for PTon 11/07/2016. She returned to the hospital in the setting of increased dyspnea and recurrent atrial fib with RVR in less than 24 hours.   On arrival to ER, BP 125/67, HR 129 bpm. Afebrile. EKG demonstrated atrial fib with RVR, 132 bpm, with lateral ST segment depression. WBC were elevated at 19.0, increased from 14.4 on discharge. Hgb 11.9, Hct 37.7. INR 2.34. Na 134, Creatinine 4.49. Troponin 0.06. CXR was negative for CHF, but questionable pleural effusion was noted.   She was started on diltiazem gtt, given albuterol, metoprolol , continued on coumadin. She is frustrated that she is back in rapid rate.   No Known Allergies  Medications Scheduled Medications: . cinacalcet  30 mg Oral Q supper  . metoprolol tartrate  25 mg Oral BID  . pantoprazole  40 mg Oral Daily  . pentoxifylline  400 mg Oral TID WC  . sevelamer carbonate  2,400 mg Oral TID WC  . simvastatin  10 mg Oral QHS  . sodium chloride flush  3 mL Intravenous Q12H  . Warfarin - Pharmacist Dosing Inpatient   Does not apply q1800     Infusions: . diltiazem (CARDIZEM) infusion 15 mg/hr (11/09/16 0733)     PRN Medications:  sodium chloride, acetaminophen **OR** acetaminophen, ALPRAZolam, ondansetron **OR** ondansetron (ZOFRAN) IV, sodium chloride flush   Past Medical History:  Diagnosis Date  . Anemia 02/11/2012  . Anxiety   . Atrial fibrillation (HCC)   . CHF (congestive heart failure) (HCC)   .  Chronic kidney disease    not on dialysis yet, Tues, thurs, sat  . GERD (gastroesophageal reflux disease)   . H/O metabolic acidosis   . Hyperlipidemia   . Hyperparathyroidism, secondary (HCC)   . Hypertension    Dr. Regino Ayala, Patricia Ayala, Patricia Ayala  . Oxygen dependent   . Vitamin D deficiency     Past Surgical History:  Procedure Laterality Date  . ABDOMINAL HYSTERECTOMY    . AV FISTULA PLACEMENT  04/01/2012   Procedure: ARTERIOVENOUS (AV) FISTULA CREATION;  Surgeon: Patricia Kerns, MD;  Location: [facility] OR;  Service: Vascular;  Laterality: Left;  . CHOLECYSTECTOMY    . COLON SURGERY     for a blockage  . EYE SURGERY     bilateral cataracts, /w IOL - 2013  . FISTULOGRAM N/A 11/05/2014   Procedure: FISTULOGRAM;  Surgeon: Patricia Earthly, MD;  Location: [facility] CATH LAB;  Service: Cardiovascular;  Laterality: N/A;  . INSERTION OF DIALYSIS CATHETER  04/10/2012   Procedure: INSERTION OF DIALYSIS CATHETER;  Surgeon: Patricia Ochoa, MD;  Location: [facility] OR;  Service: Vascular;  Laterality: N/A;  Insertion Diatek Catheter Right Internal Jugular  . LIGATION OF COMPETING BRANCHES OF ARTERIOVENOUS FISTULA  11/07/2012   Procedure: LIGATION OF COMPETING BRANCHES OF ARTERIOVENOUS FISTULA;  Surgeon: Patricia Hint, MD;  Location: [facility] OR;  Service: Vascular;  Laterality: Left;  Brachio/Cephalic Fistula  . SHUNTOGRAM N/A 11/03/2012   Procedure: Betsey Amen;  Surgeon: Patricia Hint, MD;  Location: [facility] CATH LAB;  Service: Cardiovascular;  Laterality: N/A;  . SHUNTOGRAM Left 03/09/2013   Procedure: Fistulogram;  Surgeon: Patricia Hertz, MD;  Location: [facility] CATH LAB;  Service: Cardiovascular;  Laterality: Left;  . SHUNTOGRAM Left 11/23/2013   Procedure: FISTULOGRAM;  Surgeon: Patricia Hertz, MD;  Location: [facility] CATH LAB;  Service: Cardiovascular;  Laterality: Left;    Family History  Problem Relation Age of Onset  . Stroke Patricia Ayala   . Cancer Patricia Ayala   . Heart attack Patricia Ayala     Social History Patricia Ayala reports that she  has never smoked. She has never used smokeless tobacco. Patricia Ayala reports that she does not drink alcohol.  Review of Systems Complete review of systems are found to be negative unless outlined in H&P above.  Physical Examination Blood pressure (!) 146/109, pulse (!) 112, temperature 98 F (36.7 C), temperature source Axillary, resp. rate (!) 21, height 5\' 4"  (1.626 m), weight 154 lb 8.7 oz (70.1 kg), SpO2 96 %.  Intake/Output Summary (Last 24 hours) at 11/09/16 0809 Last data filed at 11/09/16 0700  Gross per 24 hour  Intake               15 ml  Output                0 ml  Net               15 ml    Telemetry: Atrial fib rates in the 80's.   GEN:No acute distress  HEENT: Conjunctiva and lids normal, oropharynx clear with moist mucosa. Neck: Supple, no elevated JVP or carotid bruits, no thyromegaly. Lungs: Significant rhonchi, with inspiratory wheezes in the upper airways, Diminished in the right base. Coughing.  Cardiac: Iregular rate and rhythm, no S3 or significant systolic murmur, no pericardial rub. Abdomen: Soft, nontender, no hepatomegaly, bowel sounds present, no guarding or rebound. Extremities: No pitting edema, distal pulses 2+. Skin: Warm and dry. Musculoskeletal: No kyphosis. Neuropsychiatric: Alert and oriented x3, affect grossly appropriate.  Prior Cardiac Testing/Procedures Echocardiogram 09/26/2016 Left ventricle: The cavity size was normal. Wall thickness was   increased in a pattern of severe LVH. Systolic function was   normal. The estimated ejection fraction was in the range of 55%   to 60%. Wall motion was normal; there were no regional wall   motion abnormalities. Doppler parameters are consistent with   restrictive physiology, indicative of decreased left ventricular   diastolic compliance and/or increased left atrial pressure.   Doppler parameters are consistent with high ventricular filling   pressure. - Aortic valve: Trileaflet; mildly thickened,  mildly calcified   leaflets. Morphologically, there appears to be mild aortic   stenosis. There was trivial regurgitation. - Mitral valve: Calcified annulus. Mildly thickened leaflets .   There was mild regurgitation. - Left atrium: The atrium was severely dilated. - Tricuspid valve: There was mild-moderate regurgitation. - Pulmonary arteries: PA peak pressure: 36 mm Hg (S). - Pericardium, extracardiac: Small pericardial effusion. Features   were not consistent with tamponade physiology.  Lab Results  Basic Metabolic Panel:  Recent Labs Lab 11/04/16 0515 11/04/16 1626 11/05/16 0630 11/07/16 0559 11/08/16 1904 11/09/16 0420  NA 131* 130* 130* 132* 134* 134*  K 4.4 4.7 5.0 4.0 3.7 3.9  CL 93* 90* 90* 93* 92* 95*  CO2 28 28 26 28 29 29   GLUCOSE 137* 115* 97 108* 140* 111*  BUN 51* 60* 73* 52* 27* 33*  CREATININE 7.26* 8.26* 9.25* 7.24* 4.49* 5.33*  CALCIUM 8.3* 8.6* 8.3*  9.2 9.8 9.2  PHOS 5.1* 5.5* 6.5* 4.4  --   --     Liver Function Tests:  Recent Labs Lab 11/04/16 1626 11/05/16 0630 11/07/16 0559 11/08/16 1904 11/09/16 0420  AST  --   --   --  23 18  ALT  --   --   --  19 17  ALKPHOS  --   --   --  66 53  BILITOT  --   --   --  1.0 1.0  PROT  --   --   --  7.8 6.4*  ALBUMIN 2.8* 2.6* 2.7* 3.2* 2.7*    CBC:  Recent Labs Lab 11/05/16 0630 11/06/16 0609 11/07/16 0559 11/08/16 1904 11/09/16 0420  WBC 13.2* 13.1* 14.4* 19.0* 16.0*  NEUTROABS  --   --   --  14.4*  --   HGB 9.9* 10.2* 10.3* 11.9* 10.8*  HCT 30.7* 32.3* 32.2* 37.7 34.2*  MCV 94.8 94.7 93.9 94.7 95.3  PLT 318 382 419* 530* 444*    Cardiac Enzymes:  Recent Labs Lab 11/08/16 1904 11/08/16 2200 11/09/16 0420  TROPONINI 0.06* 0.05* 0.05*    Radiology: Dg Chest Port 1 View  Result Date: 11/08/2016 CLINICAL DATA:  Atrial fibrillation, shortness of breath and chest tightness. Nonproductive cough. EXAM: PORTABLE CHEST 1 VIEW COMPARISON:  11/02/2016 FINDINGS: Cardiomediastinal silhouette  is enlarged and globular. Mediastinal contours appear intact. Calcific atherosclerotic disease and tortuosity of the aorta seen. There is no evidence of focal airspace consolidation, pleural effusion or pneumothorax. Mild pulmonary vascular congestion. Osseous structures are without acute abnormality. Soft tissues are grossly normal. IMPRESSION: Enlarged and globular cardiac silhouette. This may represent cardiomegaly or pericardial effusion. Please correlate clinically. Mild pulmonary vascular congestion. Calcific atherosclerotic disease of the aorta. Electronically Signed   By: Ted Mcalpine M.D.   On: 11/08/2016 19:18     ECG: Atrial fib with RVR,. Rate of 129 bpm. Some ST depression lateral leads.   Impression and Recommendations  1. Persistant Atrial with RVR: Occurred when she was evaluated at SNF. On review of discharge summary HR was 74 bpm. Uncertain what occurred causing RVR at SNF. She states she was not there long enough to receive medications. Now on diltiazem gtt at 15 mg/hour. Was sent home on diltiazem 240 mg daily. Now also on metoprolol 25 mg BID. Will wean off diltiazem gtt and start back on po diltiazem 60 mg Q 6 hours giving room to titrate with addition of metoprolol. Continue coumadin.   2. Hypertension: Improved on current regimen, with IV diltiazem and metoprolol.   3.ESRD: Defer to nephrology.   Signed: Bettey Mare. Lawrence NP [facility]  11/09/2016, 8:09 AM Co-Sign MD  The patient was seen and examined, and I agree with the history, physical exam, assessment and plan as documented above, with modifications as noted below. Patient recently discharged with new onset atrial fibrillation readmitted with rapid atrial fibrillation. Currently on diltiazem infusion. Agree with transition back to oral diltiazem and addition of metoprolol. Anticoagulated with warfarin, INR 2.77. Flat nonspecific troponin in setting of afib with RVR, not specific for ischemia.   Echo 09/26/16:  Normal LV systolic function, EF 55-60%, restrictive diastolic dysfunction, mild to mod TR, and a small pericardial effusion. Chest xray on admission showed "Enlarged and globular cardiac silhouette. This may represent cardiomegaly or pericardial effusion."  I will repeat a limited echocardiogram to reassess effusion.  Patricia Docker, MD, [facility]  11/09/2016 10:47 AM

## 2016-11-09 NOTE — Progress Notes (Signed)
PROGRESS NOTE                                                                                                                                                                                                             Patient Demographics:    Patricia Ayala, is a 83 y.o. female, DOB - 1929/03/27, ZOX:[id number]  Admit date - 11/08/2016   Admitting Physician Meredeth Ide, MD  Outpatient Primary MD for the patient is [name]., MD  LOS - 1  Chief Complaint  Patient presents with  . Atrial Fibrillation       Brief Narrative   IreneJohnsonis a 83 y.o.female,With history of ESRD on hemodialysis Tuesday Thursday and Saturday, hypertension who was recently admitted to the hospital for atrial fibrillation with RVR,Brought in from SNF for RVR again.   Subjective:    Patricia Ayala today has, No headache, No chest pain, No abdominal pain - No Nausea, No new weakness tingling or numbness, No Cough - SOB.     Assessment  & Plan :     1.Chronic A. fib with RVR [place] vasc 2 score of at least 3. Currently on oral Cardizem and Lopressor along with IV Cardizem drip, pharmacy monitoring Coumadin. Cardiology on board. Check TSH, recent echo noted with preserved EF of 60% and mild LVH.  2. ESRD. On Tuesday, Thursday and Saturday schedule. Renal following.  3. Essential hypertension for now continue Cardizem and beta blocker and monitor.  4. Dyslipidemia. On statin.  5. GERD. PPI.  6. Chronic Diastolic CHF EF 60%. Compensated.    Family Communication  :  daughter  Code Status :  Full  Diet : Diet renal with fluid restriction Fluid restriction: 1200 mL Fluid; Room service appropriate? Yes; Fluid consistency: Thin   Disposition Plan  :  Stepdown  Consults  :  Cards, Renal  Procedures  :    DVT Prophylaxis  :  Coumadin  Lab Results  Component Value Date   INR 2.77 11/09/2016   INR 2.34 11/08/2016   INR 2.23  11/07/2016     Lab Results  Component Value Date   PLT 444 (H) 11/09/2016    Inpatient Medications  Scheduled Meds: . cinacalcet  30 mg Oral Q supper  . diltiazem  60 mg Oral Q6H  . metoprolol tartrate  25 mg Oral BID  .  pantoprazole  40 mg Oral Daily  . pentoxifylline  400 mg Oral TID WC  . sevelamer carbonate  2,400 mg Oral TID WC  . simvastatin  10 mg Oral QHS  . sodium chloride flush  3 mL Intravenous Q12H  . Warfarin - Pharmacist Dosing Inpatient   Does not apply q1800   Continuous Infusions: PRN Meds:.sodium chloride, acetaminophen **OR** acetaminophen, ALPRAZolam, ondansetron **OR** ondansetron (ZOFRAN) IV, sodium chloride flush  Antibiotics  :    Anti-infectives    None         Objective:   Vitals:   11/08/16 2209 11/09/16 0400 11/09/16 0750 11/09/16 0800  BP:   138/72 (!) 146/109  Pulse:   (!) 107 (!) 112  Resp:   (!) 23 (!) 21  Temp: 98.8 F (37.1 C) 98 F (36.7 C)    TempSrc: Oral Axillary    SpO2:   97% 96%  Weight: 70.1 kg (154 lb 8.7 oz)     Height: 5\' 4"  (1.626 m)       Wt Readings from Last 3 Encounters:  11/08/16 70.1 kg (154 lb 8.7 oz)  11/07/16 72 kg (158 lb 11.7 oz)  10/30/16 78.3 kg (172 lb 9.9 oz)     Intake/Output Summary (Last 24 hours) at 11/09/16 1000 Last data filed at 11/09/16 0800  Gross per 24 hour  Intake              120 ml  Output                0 ml  Net              120 ml     Physical Exam  Awake Alert, Oriented X 3, No new F.N deficits, Normal affect [place].AT,PERRAL Supple Neck,No JVD, No cervical lymphadenopathy appriciated.  Symmetrical Chest wall movement, Good air movement bilaterally, CTAB iRRR,No Gallops,Rubs or new Murmurs, No Parasternal Heave +ve B.Sounds, Abd Soft, No tenderness, No organomegaly appriciated, No rebound - guarding or rigidity. No Cyanosis, Clubbing or edema, No new Rash or bruise       Data Review:    CBC  Recent Labs Lab 11/05/16 0630 11/06/16 0609 11/07/16 0559  11/08/16 1904 11/09/16 0420  WBC 13.2* 13.1* 14.4* 19.0* 16.0*  HGB 9.9* 10.2* 10.3* 11.9* 10.8*  HCT 30.7* 32.3* 32.2* 37.7 34.2*  PLT 318 382 419* 530* 444*  MCV 94.8 94.7 93.9 94.7 95.3  MCH 30.6 29.9 30.0 29.9 30.1  MCHC 32.2 31.6 32.0 31.6 31.6  RDW 13.7 13.7 13.7 13.5 13.7  LYMPHSABS  --   --   --  2.1  --   MONOABS  --   --   --  2.2*  --   EOSABS  --   --   --  0.2  --   BASOSABS  --   --   --  0.1  --     Chemistries   Recent Labs Lab 11/04/16 1626 11/05/16 0630 11/07/16 0559 11/08/16 1904 11/09/16 0420  NA 130* 130* 132* 134* 134*  K 4.7 5.0 4.0 3.7 3.9  CL 90* 90* 93* 92* 95*  CO2 28 26 28 29 29   GLUCOSE 115* 97 108* 140* 111*  BUN 60* 73* 52* 27* 33*  CREATININE 8.26* 9.25* 7.24* 4.49* 5.33*  CALCIUM 8.6* 8.3* 9.2 9.8 9.2  AST  --   --   --  23 18  ALT  --   --   --  19 17  ALKPHOS  --   --   --  66 53  BILITOT  --   --   --  1.0 1.0   ------------------------------------------------------------------------------------------------------------------ No results for input(s): CHOL, HDL, LDLCALC, TRIG, CHOLHDL, LDLDIRECT in the last 72 hours.  No results found for: HGBA1C ------------------------------------------------------------------------------------------------------------------ No results for input(s): TSH, T4TOTAL, T3FREE, THYROIDAB in the last 72 hours.  Invalid input(s): FREET3 ------------------------------------------------------------------------------------------------------------------ No results for input(s): VITAMINB12, FOLATE, FERRITIN, TIBC, IRON, RETICCTPCT in the last 72 hours.  Coagulation profile  Recent Labs Lab 11/05/16 0832 11/06/16 0609 11/07/16 0559 11/08/16 1923 11/09/16 0420  INR 2.16 1.84 2.23 2.34 2.77    No results for input(s): DDIMER in the last 72 hours.  Cardiac Enzymes  Recent Labs Lab 11/08/16 1904 11/08/16 2200 11/09/16 0420  TROPONINI 0.06* 0.05* 0.05*    ------------------------------------------------------------------------------------------------------------------    Component Value Date/Time   BNP 1,962.0 (H) 11/01/2016 0655    Micro Results Recent Results (from the past 240 hour(s))  Blood Culture (routine x 2)     Status: None   Collection Time: 11/01/16  6:55 AM  Result Value Ref Range Status   Specimen Description BLOOD RIGHT HAND  Final   Special Requests BOTTLES DRAWN AEROBIC AND ANAEROBIC 6CC  Final   Culture NO GROWTH 5 DAYS  Final   Report Status 11/06/2016 FINAL  Final  Blood Culture (routine x 2)     Status: None   Collection Time: 11/01/16  7:01 AM  Result Value Ref Range Status   Specimen Description BLOOD RIGHT HAND  Final   Special Requests BOTTLES DRAWN AEROBIC ONLY 4CC  Final   Culture NO GROWTH 5 DAYS  Final   Report Status 11/06/2016 FINAL  Final    Radiology Reports US Venous Img Lower Bilateral  Result Date: 11/06/2016 CLINICAL DATA:  Pain and tenderness x2 months bilaterally EXAM: BILATERAL LOWER EXTREMITY VENOUS DOPPLER ULTRASOUND TECHNIQUE: Gray-scale sonography with compression, as well as color and duplex ultrasound, were performed to evaluate the deep venous system from the level of the common femoral vein through the popliteal and proximal calf veins. COMPARISON:  None FINDINGS: On the right, Normal compressibility of the common femoral, superficial femoral, and popliteal veins, as well as the proximal calf veins. No filling defects to suggest DVT on grayscale or color Doppler imaging. Doppler waveforms show normal direction of venous flow, normal respiratory phasicity and response to augmentation. On the left, there is eccentric partially calcified wall thickening in the popliteal vein, with resultant incomplete compressibility ; there is persistent color Doppler flow signal through this segment. More subtle wall thickening in the femoral vein is suspected, with normal color Doppler flow signal. Normal  compressibility of the common femoral and deep femoral veins, as well is visualized calf veins. IMPRESSION: 1. No evidence of right lower extremity deep vein thrombosis. 2. Probable chronic nonocclusive DVT in the left popliteal and femoral veins. No evidence of superimposed acute DVT. Electronically Signed   By: Corlis Leak M.D.   On: 11/06/2016 16:27   [place] Lower Ext Art Bilat  Result Date: 10/17/2016 CLINICAL DATA:  Bilateral lower extremity pain for 3 months with walking. EXAM: BILATERAL LOWER EXTREMITY ARTERIAL DUPLEX SCAN TECHNIQUE: Gray-scale sonography as well as color Doppler and duplex ultrasound was performed to evaluate the arteries of both lower extremities. COMPARISON:  None. FINDINGS: There is extensive plaque in the right common femoral artery which is mixed. Maximal velocity in the common femoral artery is 186 cm/sec which is somewhat elevated. Doppler analysis demonstrates a biphasic waveform. Color Doppler imaging demonstrates flow in  the proximal superficial femoral artery but occlusion in the mid and distal superficial femoral artery. Flow was not visualized in the popliteal artery. Monophasic flow was present in the anterior tibial artery. There is no evidence of flow in the posterior tibial artery. There is monophasic flow in the peroneal artery. There is extensive plaque within the common femoral artery. Very poor flow is seen at the periphery of the plaque. Flow was not visualized in the superficial femoral or popliteal arteries. Monophasic flow was present in the anterior tibial artery and peroneal arteries. Flow was not visualize in the posterior tibial artery. IMPRESSION: Significant plaque with elevated velocity in the right common femoral artery. Right superficial femoral artery and popliteal artery occlusion are suggested. Monophasic waveforms in the right anterior tibial and peroneal arteries. There is no flow in the posterior tibial artery. Severe disease with subtotal occlusion of  the left common femoral artery. Findings suggest occlusion of the left superficial femoral and popliteal arteries. Monophasic waveforms in the left anterior tibial and peroneal arteries. Electronically Signed   By: Jolaine Click M.D.   On: 10/17/2016 15:48   Dg Chest Port 1 View  Result Date: 11/08/2016 CLINICAL DATA:  Atrial fibrillation, shortness of breath and chest tightness. Nonproductive cough. EXAM: PORTABLE CHEST 1 VIEW COMPARISON:  11/02/2016 FINDINGS: Cardiomediastinal silhouette is enlarged and globular. Mediastinal contours appear intact. Calcific atherosclerotic disease and tortuosity of the aorta seen. There is no evidence of focal airspace consolidation, pleural effusion or pneumothorax. Mild pulmonary vascular congestion. Osseous structures are without acute abnormality. Soft tissues are grossly normal. IMPRESSION: Enlarged and globular cardiac silhouette. This may represent cardiomegaly or pericardial effusion. Please correlate clinically. Mild pulmonary vascular congestion. Calcific atherosclerotic disease of the aorta. Electronically Signed   By: Ted Mcalpine M.D.   On: 11/08/2016 19:18   Dg Chest Port 1 View  Result Date: 11/02/2016 CLINICAL DATA:  PICC line placement. EXAM: PORTABLE CHEST 1 VIEW COMPARISON:  Radiograph of November 01, 2016. FINDINGS: Stable cardiomegaly. Atherosclerosis of thoracic aorta is noted. Central pulmonary vascular congestion is noted. No pneumothorax or significant pleural effusion is noted. Probable mild bibasilar subsegmental atelectasis is noted. Bony thorax is unremarkable. Interval placement of right internal jugular catheter with distal tip in expected position of the SVC. IMPRESSION: Cardiomegaly. Aortic atherosclerosis. Stable central pulmonary vascular congestion. Probable bibasilar subsegmental atelectasis. Interval placement of right internal jugular catheter with distal tip in expected position of SVC. Electronically Signed   By: Lupita Raider,  M.D.   On: 11/02/2016 11:41   Dg Chest Port 1 View  Result Date: 11/01/2016 CLINICAL DATA:  Dyspnea this morning EXAM: PORTABLE CHEST 1 VIEW COMPARISON:  10/29/2016 FINDINGS: Marked cardiomegaly, unchanged. Mild vascular prominence. The vascular and interstitial pattern is improved from 10/29/2016. No airspace consolidation or significant alveolar edema. IMPRESSION: Improved from 10/29/2016.  Marked cardiomegaly persists. Electronically Signed   By: Ellery Plunk M.D.   On: 11/01/2016 06:56   Dg Chest Portable 1 View  Result Date: 10/29/2016 CLINICAL DATA:  woke up feeling sob this morning. --congested cough since arriving. --swelling to bilateral lower legs - EXAM: PORTABLE CHEST 1 VIEW COMPARISON:  09/26/2016 FINDINGS: Moderate enlargement of the cardiopericardial silhouette. No mediastinal or hilar masses. There is central vascular congestion and bilateral interstitial thickening with small pleural effusions. Findings are similar to the most recent prior exam consistent with congestive heart failure. No pneumothorax. IMPRESSION: 1. Congestive heart failure similar in appearance to the most recent prior chest radiograph. Electronically Signed  By: Amie Portland M.D.   On: 10/29/2016 08:03    Time Spent in minutes  30   Denesia Donelan K M.D on 11/09/2016 at 10:00 AM  Between 7am to 7pm - Pager - [phone]  After 7pm go to [email or web address] - password [facility]  [facility] -  Office  [phone]

## 2016-11-09 NOTE — Progress Notes (Addendum)
ANTICOAGULATION CONSULT NOTE   Pharmacy Consult for Coumadin Indication: atrial fibrillation  No Known Allergies  Patient Measurements: Height: 5\' 4"  (162.6 cm) Weight: 154 lb 8.7 oz (70.1 kg) IBW/kg (Calculated) : 54.7 Heparin Dosing Weight:   Vital Signs: Temp: 98 F (36.7 C) (01/12 0400) Temp Source: Axillary (01/12 0400) BP: 146/109 (01/12 0800) Pulse Rate: 112 (01/12 0800)  Labs:  Recent Labs  11/07/16 0559  11/08/16 1904 11/08/16 1923 11/08/16 2200 11/09/16 0420 11/09/16 0944  HGB 10.3*  --  11.9*  --   --  10.8*  --   HCT 32.2*  --  37.7  --   --  34.2*  --   PLT 419*  --  530*  --   --  444*  --   LABPROT 25.1*  --   --  26.1*  --  29.8*  --   INR 2.23  --   --  2.34  --  2.77  --   HEPARINUNFRC <0.10*  --   --   --   --   --   --   CREATININE 7.24*  --  4.49*  --   --  5.33*  --   TROPONINI  --   < > 0.06*  --  0.05* 0.05* 0.04*  < > = values in this interval not displayed.  Estimated Creatinine Clearance: 7.4 mL/min (by C-G formula based on SCr of 5.33 mg/dL (H)).   Medical History: Past Medical History:  Diagnosis Date  . Anemia 02/11/2012  . Anxiety   . Atrial fibrillation (HCC)   . CHF (congestive heart failure) (HCC)   . Chronic kidney disease    not on dialysis yet, Tues, thurs, sat  . GERD (gastroesophageal reflux disease)   . H/O metabolic acidosis   . Hyperlipidemia   . Hyperparathyroidism, secondary (HCC)   . Hypertension    Dr. Regino Schultze, [place], [place]  . Oxygen dependent   . Vitamin D deficiency     Medications: See med rec  Assessment: Continuation of Coumadin PTA for AFIB. INR therapeutic on admission  and remains therapeutic this AM.   Goal of Therapy:  INR 2-3 Monitor platelets by anticoagulation protocol: Yes   Plan:  Coumadin 5mg  po today INR/PT daily Monitor for S/S of bleeding  Elder Cyphers, BS Loura Back, BCPS Clinical Pharmacist Pager [phone] 11/09/2016,11:16 AM

## 2016-11-09 NOTE — Clinical Social Work Note (Signed)
Patient admitted from [facility]. Patient went to [facility] on Wednesday for rehab. Patient can return and does not need a new FL2.      Annali Lybrand, Juleen China, LCSW

## 2016-11-10 ENCOUNTER — Inpatient Hospital Stay
Admission: RE | Admit: 2016-11-10 | Discharge: 2016-11-28 | Disposition: A | Discharge status: Home / Self Care | Payer: Medicare Other | Source: Ambulatory Visit | Attending: Internal Medicine | Admitting: Internal Medicine

## 2016-11-10 ENCOUNTER — Encounter: Payer: Self-pay | Admitting: Internal Medicine

## 2016-11-10 DIAGNOSIS — J42 Unspecified chronic bronchitis: Secondary | ICD-10-CM | POA: Diagnosis not present

## 2016-11-10 DIAGNOSIS — I10 Essential (primary) hypertension: Secondary | ICD-10-CM | POA: Diagnosis not present

## 2016-11-10 DIAGNOSIS — M6281 Muscle weakness (generalized): Secondary | ICD-10-CM | POA: Diagnosis not present

## 2016-11-10 DIAGNOSIS — N2581 Secondary hyperparathyroidism of renal origin: Secondary | ICD-10-CM | POA: Diagnosis not present

## 2016-11-10 DIAGNOSIS — D649 Anemia, unspecified: Secondary | ICD-10-CM | POA: Diagnosis not present

## 2016-11-10 DIAGNOSIS — E785 Hyperlipidemia, unspecified: Secondary | ICD-10-CM | POA: Diagnosis not present

## 2016-11-10 DIAGNOSIS — K219 Gastro-esophageal reflux disease without esophagitis: Secondary | ICD-10-CM | POA: Diagnosis not present

## 2016-11-10 DIAGNOSIS — I5033 Acute on chronic diastolic (congestive) heart failure: Secondary | ICD-10-CM | POA: Diagnosis not present

## 2016-11-10 DIAGNOSIS — D631 Anemia in chronic kidney disease: Secondary | ICD-10-CM | POA: Diagnosis not present

## 2016-11-10 DIAGNOSIS — I501 Left ventricular failure: Secondary | ICD-10-CM | POA: Diagnosis not present

## 2016-11-10 DIAGNOSIS — J069 Acute upper respiratory infection, unspecified: Secondary | ICD-10-CM | POA: Diagnosis not present

## 2016-11-10 DIAGNOSIS — R262 Difficulty in walking, not elsewhere classified: Secondary | ICD-10-CM | POA: Diagnosis not present

## 2016-11-10 DIAGNOSIS — Z992 Dependence on renal dialysis: Secondary | ICD-10-CM | POA: Diagnosis not present

## 2016-11-10 DIAGNOSIS — R0600 Dyspnea, unspecified: Secondary | ICD-10-CM | POA: Diagnosis not present

## 2016-11-10 DIAGNOSIS — Z9981 Dependence on supplemental oxygen: Secondary | ICD-10-CM | POA: Diagnosis not present

## 2016-11-10 DIAGNOSIS — I4891 Unspecified atrial fibrillation: Secondary | ICD-10-CM | POA: Diagnosis not present

## 2016-11-10 DIAGNOSIS — N186 End stage renal disease: Secondary | ICD-10-CM | POA: Diagnosis not present

## 2016-11-10 DIAGNOSIS — I5032 Chronic diastolic (congestive) heart failure: Secondary | ICD-10-CM | POA: Diagnosis not present

## 2016-11-10 DIAGNOSIS — R279 Unspecified lack of coordination: Secondary | ICD-10-CM | POA: Diagnosis not present

## 2016-11-10 LAB — PROTIME-INR
INR: 3.2
Prothrombin Time: 33.5 seconds — ABNORMAL HIGH (ref 11.4–15.2)

## 2016-11-10 MED ORDER — SODIUM CHLORIDE 0.9 % IV SOLN: 100.0000 mL | INTRAVENOUS | Status: DC | PRN

## 2016-11-10 MED ORDER — METOPROLOL TARTRATE 25 MG PO TABS: 25.0000 mg | ORAL_TABLET | Freq: Two times a day (BID) | ORAL | 0 refills | Status: DC

## 2016-11-10 MED ORDER — EPOETIN ALFA 2000 UNIT/ML IJ SOLN: 2000.0000 [IU] | INTRAMUSCULAR | Status: DC

## 2016-11-10 MED ORDER — DILTIAZEM HCL 90 MG PO TABS: 90.0000 mg | ORAL_TABLET | Freq: Four times a day (QID) | ORAL | 0 refills | Status: DC

## 2016-11-10 MED ORDER — LIDOCAINE HCL (PF) 1 % IJ SOLN: 5.0000 mL | INTRAMUSCULAR | Status: DC | PRN

## 2016-11-10 MED ORDER — DILTIAZEM HCL 60 MG PO TABS
90.0000 mg | ORAL_TABLET | Freq: Four times a day (QID) | ORAL | Status: DC
  Administered 2016-11-10 (×2): 90 mg via ORAL
  Filled 2016-11-10 (×2): qty 1

## 2016-11-10 MED ORDER — LIDOCAINE-PRILOCAINE 2.5-2.5 % EX CREA
1.0000 "application " | TOPICAL_CREAM | CUTANEOUS | Status: DC | PRN
  Filled 2016-11-10: qty 5

## 2016-11-10 MED ORDER — SODIUM CHLORIDE 0.9 % IV SOLN
100.0000 mL | INTRAVENOUS | Status: DC | PRN
  Administered 2016-11-10: 250 mL via INTRAVENOUS

## 2016-11-10 MED ORDER — EPOETIN ALFA 2000 UNIT/ML IJ SOLN
2000.0000 [IU] | INTRAMUSCULAR | Status: DC
  Administered 2016-11-10: 2000 [IU] via INTRAVENOUS
  Filled 2016-11-10: qty 1

## 2016-11-10 MED ORDER — PENTAFLUOROPROP-TETRAFLUOROETH EX AERO
1.0000 "application " | INHALATION_SPRAY | CUTANEOUS | Status: DC | PRN
  Filled 2016-11-10: qty 30

## 2016-11-10 NOTE — Progress Notes (Signed)
Patient being discharged back to [facility] and will go to room 119. Discharge AVS will go with patient as well as prescriptions. IV access will be removed.

## 2016-11-10 NOTE — Discharge Summary (Signed)
Patricia Ayala WJX:[id number] DOB: 15-Apr-1929 DOA: 11/08/2016  PCP: [name]., MD  Admit date: 11/08/2016  Discharge date: 11/10/2016  Admitted From: Home   Disposition: SNF   Recommendations for Outpatient Follow-up:   Follow up with PCP in 1-2 weeks  PCP Please obtain BMP/CBC, 2 view CXR in 1week,  (see Discharge instructions)   PCP Please follow up on the following pending results: None   Home Health: None   Equipment/Devices: None  Consultations: Cards, Renal Discharge Condition: Stable   CODE STATUS: Full   Diet Recommendation: Diet renal with fluid restriction : 1200 mL Fluid/day   Chief Complaint  Patient presents with  . Atrial Fibrillation     Brief history of present illness from the day of admission and additional interim summary     IreneJohnsonis a 83 y.o.female,With history of ESRD on hemodialysis Tuesday Thursday and Saturday, hypertension who was recently admitted to the hospital for atrial fibrillation with RVR,Brought in from SNF for RVR again.   Hospital issues addressed     1.Chronic A. fib with RVR [place] vasc 2 score of at least 3. Was placed on Cardizem drip, oral Cardizem and beta blocker was started with good effect, she is now in great control, she will be discharged home on oral Cardizem and Lopressor, Coumadin will be continued as before. Note her INR today is 3.2 , request SNF M.D. recheck INR in 1-2 days and keep a close eye on it, she had a repeat echocardiogram and TSH both of which were unremarkable.   She was seen by cardiology team. She is currently symptom-free in great control will follow with cardiology in 1-2 weeks after discharge. Note her EF was 55% with some LVH and she was noted to have severely calcified mitral valve annulus for which she will follow  with cardiology outpatient.    2. ESRD. On Tuesday, Thursday and Saturday schedule. Renal following, is being dialyzed today prior to discharge.  3. Essential hypertension for now continue Cardizem and beta blocker and monitor.  4. Dyslipidemia. On statin.  5. GERD. PPI.  6. Chronic Diastolic CHF EF 55-60%. Compensated.  7. Nonspecific stress-related leukocytosis. Stable no fever, no cough no dysuria. Repeat CBC in a week.    Discharge diagnosis     Active Problems:   ESRD (end stage renal disease) (HCC)   Benign essential HTN   Atrial fibrillation with RVR (HCC)    Discharge instructions    Discharge Instructions    Diet - low sodium heart healthy    Complete by:  As directed    Discharge instructions    Complete by:  As directed    Follow with Primary MD [name]., MD in 5 days   Get CBC, CMP, INR, 2 view Chest X ray checked  by Primary MD or SNF MD in 5 days ( we routinely change or add medications that can affect your baseline labs and fluid status, therefore we recommend that you get the mentioned basic workup next visit with your PCP,  your PCP may decide not to get them or add new tests based on their clinical decision)   Activity: As tolerated with Full fall precautions use walker/cane & assistance as needed   Disposition Home    Diet:   Diet renal with fluid restriction Fluid restriction: 1200 mL Fluid .   Check your Weight same time everyday, if you gain over 2 pounds, or you develop in leg swelling, experience more shortness of breath or chest pain, call your Primary MD immediately. Follow Cardiac Low Salt Diet and 1.2 lit/day fluid restriction.   On your next visit with your primary care physician please Get Medicines reviewed and adjusted.   Please request your Prim.MD to go over all Hospital Tests and Procedure/Radiological results at the follow up, please get all Hospital records sent to your Prim MD by signing hospital release before  you go home.   If you experience worsening of your admission symptoms, develop shortness of breath, life threatening emergency, suicidal or homicidal thoughts you must seek medical attention immediately by calling 911 or calling your MD immediately  if symptoms less severe.  You Must read complete instructions/literature along with all the possible adverse reactions/side effects for all the Medicines you take and that have been prescribed to you. Take any new Medicines after you have completely understood and accpet all the possible adverse reactions/side effects.   Do not drive, operate heavy machinery, perform activities at heights, swimming or participation in water activities or provide baby sitting services if your were admitted for syncope or siezures until you have seen by Primary MD or a Neurologist and advised to do so again.  Do not drive when taking Pain medications.    Do not take more than prescribed Pain, Sleep and Anxiety Medications  Special Instructions: If you have smoked or chewed Tobacco  in the last 2 yrs please stop smoking, stop any regular Alcohol  and or any Recreational drug use.  Wear Seat belts while driving.   Please note  You were cared for by a hospitalist during your hospital stay. If you have any questions about your discharge medications or the care you received while you were in the hospital after you are discharged, you can call the unit and asked to speak with the hospitalist on call if the hospitalist that took care of you is not available. Once you are discharged, your primary care physician will handle any further medical issues. Please note that NO REFILLS for any discharge medications will be authorized once you are discharged, as it is imperative that you return to your primary care physician (or establish a relationship with a primary care physician if you do not have one) for your aftercare needs so that they can reassess your need for medications and  monitor your lab values.   Increase activity slowly    Complete by:  As directed       Discharge Medications   Allergies as of 11/10/2016   No Known Allergies     Medication List    STOP taking these medications   diltiazem 240 MG 24 hr capsule Commonly known as:  CARDIZEM CD     TAKE these medications   albuterol 108 (90 Base) MCG/ACT inhaler Commonly known as:  PROVENTIL HFA;VENTOLIN HFA Inhale 2 puffs into the lungs every 6 (six) hours as needed for wheezing or shortness of breath.   ALPRAZolam 0.5 MG tablet Commonly known as:  XANAX Take 1 tablet by mouth 3 (three) times daily  as needed for anxiety.   Biotin 1 MG Caps Take 1 mg by mouth daily.   cinacalcet 30 MG tablet Commonly known as:  SENSIPAR Take 30 mg by mouth at bedtime.   diltiazem 90 MG tablet Commonly known as:  CARDIZEM Take 1 tablet (90 mg total) by mouth every 6 (six) hours.   guaiFENesin 600 MG 12 hr tablet Commonly known as:  MUCINEX Take 1 tablet (600 mg total) by mouth 2 (two) times daily.   ipratropium-albuterol 0.5-2.5 (3) MG/3ML Soln Commonly known as:  DUONEB Take 3 mLs by nebulization every 6 (six) hours as needed. What changed:  reasons to take this   lidocaine-prilocaine cream Commonly known as:  EMLA Apply 1 application topically as needed (for IV access).   metoprolol tartrate 25 MG tablet Commonly known as:  LOPRESSOR Take 1 tablet (25 mg total) by mouth 2 (two) times daily.   multivitamin Tabs tablet Take 1 tablet by mouth daily.   pantoprazole 40 MG tablet Commonly known as:  PROTONIX Take 1 tablet by mouth daily.   pentoxifylline 400 MG CR tablet Commonly known as:  TRENTAL Take 400 mg by mouth 3 (three) times daily with meals.   promethazine 25 MG tablet Commonly known as:  PHENERGAN Take 25 mg by mouth every 6 (six) hours as needed for nausea or vomiting.   sevelamer carbonate 800 MG tablet Commonly known as:  RENVELA Take 800-2,400 mg by mouth 3 (three)  times daily with meals. Take  with snacks daily.   simvastatin 10 MG tablet Commonly known as:  ZOCOR Take 10 mg by mouth at bedtime.   warfarin 5 MG tablet Commonly known as:  COUMADIN Take 1 tablet (5 mg total) by mouth daily at 6 PM.       Follow-up Information    FUSCO,LAWRENCE J., MD. Schedule an appointment as soon as possible for a visit in 1 week(s).   Specialty:  Internal Medicine Contact information: [address] [place] [address] [phone]        Dina Rich, MD. Schedule an appointment as soon as possible for a visit in 5 day(s).   Specialty:  Cardiology Why:  Afib Contact information: [address] [place] [address] [phone]           Major procedures and Radiology Reports - PLEASE review detailed and final reports thoroughly  -      TTE  - Left ventricle: The cavity size was normal. Systolic function was normal. The estimated ejection fraction was 55%. Wall motion was normal; there were no regional wall motion abnormalities. - Aortic valve: Trileaflet; mildly thickened, mildly calcified leaflets. Morphologically, there appears to be mild aortic stenosis. - Mitral valve: Severely calcified annulus. Mildly thickened leaflets . - Left atrium: The atrium was severely dilated. - Right atrium: The atrium was mildly dilated. - Pericardium, extracardiac: Small to moderate size pericardial  effusion. No evidence of tamponade physiology.  US Venous Img Lower Bilateral  Result Date: 11/06/2016 CLINICAL DATA:  Pain and tenderness x2 months bilaterally EXAM: BILATERAL LOWER EXTREMITY VENOUS DOPPLER ULTRASOUND TECHNIQUE: Gray-scale sonography with compression, as well as color and duplex ultrasound, were performed to evaluate the deep venous system from the level of the common femoral vein through the popliteal and proximal calf veins. COMPARISON:  None FINDINGS: On the right, Normal compressibility of the common femoral,  superficial femoral, and popliteal veins, as well as the proximal calf veins. No filling defects to suggest DVT on grayscale or color  Doppler imaging. Doppler waveforms show normal direction of venous flow, normal respiratory phasicity and response to augmentation. On the left, there is eccentric partially calcified wall thickening in the popliteal vein, with resultant incomplete compressibility ; there is persistent color Doppler flow signal through this segment. More subtle wall thickening in the femoral vein is suspected, with normal color Doppler flow signal. Normal compressibility of the common femoral and deep femoral veins, as well is visualized calf veins. IMPRESSION: 1. No evidence of right lower extremity deep vein thrombosis. 2. Probable chronic nonocclusive DVT in the left popliteal and femoral veins. No evidence of superimposed acute DVT. Electronically Signed   By: Corlis Leak M.D.   On: 11/06/2016 16:27   [place] Lower Ext Art Bilat  Result Date: 10/17/2016 CLINICAL DATA:  Bilateral lower extremity pain for 3 months with walking. EXAM: BILATERAL LOWER EXTREMITY ARTERIAL DUPLEX SCAN TECHNIQUE: Gray-scale sonography as well as color Doppler and duplex ultrasound was performed to evaluate the arteries of both lower extremities. COMPARISON:  None. FINDINGS: There is extensive plaque in the right common femoral artery which is mixed. Maximal velocity in the common femoral artery is 186 cm/sec which is somewhat elevated. Doppler analysis demonstrates a biphasic waveform. Color Doppler imaging demonstrates flow in the proximal superficial femoral artery but occlusion in the mid and distal superficial femoral artery. Flow was not visualized in the popliteal artery. Monophasic flow was present in the anterior tibial artery. There is no evidence of flow in the posterior tibial artery. There is monophasic flow in the peroneal artery. There is extensive plaque within the common femoral artery. Very poor flow is  seen at the periphery of the plaque. Flow was not visualized in the superficial femoral or popliteal arteries. Monophasic flow was present in the anterior tibial artery and peroneal arteries. Flow was not visualize in the posterior tibial artery. IMPRESSION: Significant plaque with elevated velocity in the right common femoral artery. Right superficial femoral artery and popliteal artery occlusion are suggested. Monophasic waveforms in the right anterior tibial and peroneal arteries. There is no flow in the posterior tibial artery. Severe disease with subtotal occlusion of the left common femoral artery. Findings suggest occlusion of the left superficial femoral and popliteal arteries. Monophasic waveforms in the left anterior tibial and peroneal arteries. Electronically Signed   By: Jolaine Click M.D.   On: 10/17/2016 15:48   Dg Chest Port 1 View  Result Date: 11/08/2016 CLINICAL DATA:  Atrial fibrillation, shortness of breath and chest tightness. Nonproductive cough. EXAM: PORTABLE CHEST 1 VIEW COMPARISON:  11/02/2016 FINDINGS: Cardiomediastinal silhouette is enlarged and globular. Mediastinal contours appear intact. Calcific atherosclerotic disease and tortuosity of the aorta seen. There is no evidence of focal airspace consolidation, pleural effusion or pneumothorax. Mild pulmonary vascular congestion. Osseous structures are without acute abnormality. Soft tissues are grossly normal. IMPRESSION: Enlarged and globular cardiac silhouette. This may represent cardiomegaly or pericardial effusion. Please correlate clinically. Mild pulmonary vascular congestion. Calcific atherosclerotic disease of the aorta. Electronically Signed   By: Ted Mcalpine M.D.   On: 11/08/2016 19:18   Dg Chest Port 1 View  Result Date: 11/02/2016 CLINICAL DATA:  PICC line placement. EXAM: PORTABLE CHEST 1 VIEW COMPARISON:  Radiograph of November 01, 2016. FINDINGS: Stable cardiomegaly. Atherosclerosis of thoracic aorta is noted.  Central pulmonary vascular congestion is noted. No pneumothorax or significant pleural effusion is noted. Probable mild bibasilar subsegmental atelectasis is noted. Bony thorax is unremarkable. Interval placement of right internal jugular catheter with distal tip in  expected position of the SVC. IMPRESSION: Cardiomegaly. Aortic atherosclerosis. Stable central pulmonary vascular congestion. Probable bibasilar subsegmental atelectasis. Interval placement of right internal jugular catheter with distal tip in expected position of SVC. Electronically Signed   By: Lupita Raider, M.D.   On: 11/02/2016 11:41   Dg Chest Port 1 View  Result Date: 11/01/2016 CLINICAL DATA:  Dyspnea this morning EXAM: PORTABLE CHEST 1 VIEW COMPARISON:  10/29/2016 FINDINGS: Marked cardiomegaly, unchanged. Mild vascular prominence. The vascular and interstitial pattern is improved from 10/29/2016. No airspace consolidation or significant alveolar edema. IMPRESSION: Improved from 10/29/2016.  Marked cardiomegaly persists. Electronically Signed   By: Ellery Plunk M.D.   On: 11/01/2016 06:56   Dg Chest Portable 1 View  Result Date: 10/29/2016 CLINICAL DATA:  woke up feeling sob this morning. --congested cough since arriving. --swelling to bilateral lower legs - EXAM: PORTABLE CHEST 1 VIEW COMPARISON:  09/26/2016 FINDINGS: Moderate enlargement of the cardiopericardial silhouette. No mediastinal or hilar masses. There is central vascular congestion and bilateral interstitial thickening with small pleural effusions. Findings are similar to the most recent prior exam consistent with congestive heart failure. No pneumothorax. IMPRESSION: 1. Congestive heart failure similar in appearance to the most recent prior chest radiograph. Electronically Signed   By: Amie Portland M.D.   On: 10/29/2016 08:03    Micro Results    Recent Results (from the past 240 hour(s))  Blood Culture (routine x 2)     Status: None   Collection Time: 11/01/16   6:55 AM  Result Value Ref Range Status   Specimen Description BLOOD RIGHT HAND  Final   Special Requests BOTTLES DRAWN AEROBIC AND ANAEROBIC 6CC  Final   Culture NO GROWTH 5 DAYS  Final   Report Status 11/06/2016 FINAL  Final  Blood Culture (routine x 2)     Status: None   Collection Time: 11/01/16  7:01 AM  Result Value Ref Range Status   Specimen Description BLOOD RIGHT HAND  Final   Special Requests BOTTLES DRAWN AEROBIC ONLY 4CC  Final   Culture NO GROWTH 5 DAYS  Final   Report Status 11/06/2016 FINAL  Final    Today   Subjective    Samari Bittinger today has no headache,no chest abdominal pain,no new weakness tingling or numbness, feels much better wants to go home today.    Objective   Blood pressure (!) 115/53, pulse (!) 101, temperature 98 F (36.7 C), temperature source Oral, resp. rate 15, height 5\' 4"  (1.626 m), weight 71.3 kg (157 lb 3 oz), SpO2 95 %.   Intake/Output Summary (Last 24 hours) at 11/10/16 0905 Last data filed at 11/09/16 2200  Gross per 24 hour  Intake              363 ml  Output                0 ml  Net              363 ml    Exam Awake Alert, Oriented x 3, No new F.N deficits, Normal affect [place].AT,PERRAL Supple Neck,No JVD, No cervical lymphadenopathy appriciated.  Symmetrical Chest wall movement, Good air movement bilaterally, CTAB iRRR,No Gallops,Rubs or new Murmurs, No Parasternal Heave +ve B.Sounds, Abd Soft, Non tender, No organomegaly appriciated, No rebound -guarding or rigidity. No Cyanosis, Clubbing or edema, No new Rash or bruise   Data Review   CBC w Diff: Lab Results  Component Value Date   WBC 16.0 (H) 11/09/2016  HGB 10.8 (L) 11/09/2016   HCT 34.2 (L) 11/09/2016   PLT 444 (H) 11/09/2016   LYMPHOPCT 11 11/08/2016   MONOPCT 11 11/08/2016   EOSPCT 1 11/08/2016   BASOPCT 0 11/08/2016    CMP: Lab Results  Component Value Date   NA 134 (L) 11/09/2016   K 3.9 11/09/2016   CL 95 (L) 11/09/2016   CO2 29 11/09/2016   BUN 33  (H) 11/09/2016   CREATININE 5.33 (H) 11/09/2016   PROT 6.4 (L) 11/09/2016   ALBUMIN 2.7 (L) 11/09/2016   BILITOT 1.0 11/09/2016   ALKPHOS 53 11/09/2016   AST 18 11/09/2016   ALT 17 11/09/2016  .   Total Time in preparing paper work, data evaluation and todays exam - 35 minutes  Leroy Sea M.D on 11/10/2016 at 9:05 AM  [facility]   Office  [phone]

## 2016-11-10 NOTE — Discharge Instructions (Signed)
Follow with Primary MD [name]., MD in 5 days   Get CBC, CMP, INR, 2 view Chest X ray checked  by Primary MD or SNF MD in 5 days ( we routinely change or add medications that can affect your baseline labs and fluid status, therefore we recommend that you get the mentioned basic workup next visit with your PCP, your PCP may decide not to get them or add new tests based on their clinical decision)   Activity: As tolerated with Full fall precautions use walker/cane & assistance as needed   Disposition Home    Diet:   Diet renal with fluid restriction Fluid restriction: 1200 mL Fluid .   Check your Weight same time everyday, if you gain over 2 pounds, or you develop in leg swelling, experience more shortness of breath or chest pain, call your Primary MD immediately. Follow Cardiac Low Salt Diet and 1.2 lit/day fluid restriction.   On your next visit with your primary care physician please Get Medicines reviewed and adjusted.   Please request your Prim.MD to go over all Hospital Tests and Procedure/Radiological results at the follow up, please get all Hospital records sent to your Prim MD by signing hospital release before you go home.   If you experience worsening of your admission symptoms, develop shortness of breath, life threatening emergency, suicidal or homicidal thoughts you must seek medical attention immediately by calling 911 or calling your MD immediately  if symptoms less severe.  You Must read complete instructions/literature along with all the possible adverse reactions/side effects for all the Medicines you take and that have been prescribed to you. Take any new Medicines after you have completely understood and accpet all the possible adverse reactions/side effects.   Do not drive, operate heavy machinery, perform activities at heights, swimming or participation in water activities or provide baby sitting services if your were admitted for syncope or siezures until you  have seen by Primary MD or a Neurologist and advised to do so again.  Do not drive when taking Pain medications.    Do not take more than prescribed Pain, Sleep and Anxiety Medications  Special Instructions: If you have smoked or chewed Tobacco  in the last 2 yrs please stop smoking, stop any regular Alcohol  and or any Recreational drug use.  Wear Seat belts while driving.   Please note  You were cared for by a hospitalist during your hospital stay. If you have any questions about your discharge medications or the care you received while you were in the hospital after you are discharged, you can call the unit and asked to speak with the hospitalist on call if the hospitalist that took care of you is not available. Once you are discharged, your primary care physician will handle any further medical issues. Please note that NO REFILLS for any discharge medications will be authorized once you are discharged, as it is imperative that you return to your primary care physician (or establish a relationship with a primary care physician if you do not have one) for your aftercare needs so that they can reassess your need for medications and monitor your lab values.

## 2016-11-10 NOTE — Progress Notes (Signed)
Patricia Ayala  MRN: [id number]  DOB/AGE: 01-25-1929 83 y.o.  Primary Care Physician:FUSCO,LAWRENCE J., MD  Admit date: 11/08/2016  Chief Complaint:  Chief Complaint  Patient presents with  . Atrial Fibrillation    S-Pt presented on  11/08/2016 with  Chief Complaint  Patient presents with  . Atrial Fibrillation  .    Pt today feels better.Pt family is present in the room    Meds  . cinacalcet  30 mg Oral Q supper  . diltiazem  90 mg Oral Q6H  . metoprolol tartrate  25 mg Oral BID  . pantoprazole  40 mg Oral Daily  . pentoxifylline  400 mg Oral TID WC  . sevelamer carbonate  2,400 mg Oral TID WC  . simvastatin  10 mg Oral QHS  . sodium chloride flush  3 mL Intravenous Q12H  . Warfarin - Pharmacist Dosing Inpatient   Does not apply q1800        Physical Exam: Vital signs in last 24 hours: Temp:  [97.9 F (36.6 C)-99.1 F (37.3 C)] 98 F (36.7 C) (01/13 0730) Pulse Rate:  [73-115] 101 (01/13 0730) Resp:  [13-27] 15 (01/13 0730) BP: (94-257)/(51-188) 115/53 (01/13 0730) SpO2:  [92 %-99 %] 95 % (01/13 0730) Weight:  [157 lb 3 oz (71.3 kg)] 157 lb 3 oz (71.3 kg) (01/13 0400) Weight change: -13 oz (-0.368 kg) Last BM Date: 11/08/16  Intake/Output from previous day: 01/12 0701 - 01/13 0700 In: 483 [P.O.:420; I.V.:63] Out: -  Total I/O In: 120 [P.O.:120] Out: -    Physical Exam: General- pt is awake,alert, oriented to time place and person Resp- No acute REsp distress, minimal Rhonchi+ CVS- S1S2 regular in rate and rhythm GIT- BS+, soft, NT, ND EXT- trace LE Edema, NO Cyanosis Access- AVF +    Lab Results: CBC  Recent Labs  11/08/16 1904 11/09/16 0420  WBC 19.0* 16.0*  HGB 11.9* 10.8*  HCT 37.7 34.2*  PLT 530* 444*    BMET  Recent Labs  11/08/16 1904 11/09/16 0420  NA 134* 134*  K 3.7 3.9  CL 92* 95*  CO2 29 29  GLUCOSE 140* 111*  BUN 27* 33*  CREATININE 4.49* 5.33*  CALCIUM 9.8 9.2    MICRO Recent Results (from the past 240  hour(s))  Blood Culture (routine x 2)     Status: None   Collection Time: 11/01/16  6:55 AM  Result Value Ref Range Status   Specimen Description BLOOD RIGHT HAND  Final   Special Requests BOTTLES DRAWN AEROBIC AND ANAEROBIC 6CC  Final   Culture NO GROWTH 5 DAYS  Final   Report Status 11/06/2016 FINAL  Final  Blood Culture (routine x 2)     Status: None   Collection Time: 11/01/16  7:01 AM  Result Value Ref Range Status   Specimen Description BLOOD RIGHT HAND  Final   Special Requests BOTTLES DRAWN AEROBIC ONLY 4CC  Final   Culture NO GROWTH 5 DAYS  Final   Report Status 11/06/2016 FINAL  Final      Lab Results  Component Value Date   CALCIUM 9.2 11/09/2016   CAION 1.07 (L) 11/05/2014   PHOS 4.4 11/07/2016               Impression: 1)Renal  ESRD on HD                Pt is on Tue/Thurs/saturday schedule  Pt was dialyzed on Thursday as outpt.                Pt is  to be dialyzed today   2)HTN BP not at goal  Medication Calcium blockers Beta blockers   3)Anemia In ESRD the goal for HGb is 9--11. Hgb at goal Will keep on epo  4)CKD Mineral-Bone Disorder Phosphorus is at goal     On binders. Calcium is  at goal.  5)CHF- Pt admitted with pulmonary edema PMD following  6)Electrolytes  Normokalemic  Hyponatremic    Sec to ESRD  7)Acid base Co2 at goal   Plan:  Will dialyze today. Will try to take 2 liters off Will keep on epo  Addendum Pt tolerated tx well.    Lavar Rosenzweig S 11/10/2016, 9:29 AM

## 2016-11-10 NOTE — Progress Notes (Signed)
ANTICOAGULATION CONSULT NOTE   Pharmacy Consult for Coumadin Indication: atrial fibrillation  No Known Allergies  Patient Measurements: Height: 5\' 4"  (162.6 cm) Weight: 157 lb 6.5 oz (71.4 kg) IBW/kg (Calculated) : 54.7 Heparin Dosing Weight:   Vital Signs: Temp: 98.1 F (36.7 C) (01/13 0900) Temp Source: Oral (01/13 0900) BP: 99/60 (01/13 0945) Pulse Rate: 95 (01/13 0945)  Labs:  Recent Labs  11/08/16 1904 11/08/16 1923 11/08/16 2200 11/09/16 0420 11/09/16 0944 11/10/16 0523  HGB 11.9*  --   --  10.8*  --   --   HCT 37.7  --   --  34.2*  --   --   PLT 530*  --   --  444*  --   --   LABPROT  --  26.1*  --  29.8*  --  33.5*  INR  --  2.34  --  2.77  --  3.20  CREATININE 4.49*  --   --  5.33*  --   --   TROPONINI 0.06*  --  0.05* 0.05* 0.04*  --     Estimated Creatinine Clearance: 7.5 mL/min (by C-G formula based on SCr of 5.33 mg/dL (H)).   Medical History: Past Medical History:  Diagnosis Date  . Anemia 02/11/2012  . Anxiety   . Atrial fibrillation (HCC)   . CHF (congestive heart failure) (HCC)   . Chronic kidney disease    not on dialysis yet, Tues, thurs, sat  . GERD (gastroesophageal reflux disease)   . H/O metabolic acidosis   . Hyperlipidemia   . Hyperparathyroidism, secondary (HCC)   . Hypertension    Dr. Regino Schultze, [place] [facility], [place]  . Oxygen dependent   . Vitamin D deficiency     Medications: See med rec  Assessment: Continuation of Coumadin PTA for AFIB. INR therapeutic on admission.  INR slightly elevated this AM at 3.2.  Goal of Therapy:  INR 2-3 Monitor platelets by anticoagulation protocol: Yes   Plan:  No coumadin today INR/PT daily Monitor for S/S of bleeding  Patricia Ayala, BS Loura Back, BCPS Clinical Pharmacist Pager [phone] 11/10/2016,9:57 AM

## 2016-11-11 ENCOUNTER — Encounter (HOSPITAL_COMMUNITY)
Admission: RE | Admit: 2016-11-11 | Discharge: 2016-11-11 | Disposition: A | Discharge status: Home / Self Care | Payer: Medicare Other | Source: Skilled Nursing Facility | Attending: *Deleted | Admitting: *Deleted

## 2016-11-11 LAB — PROTIME-INR
INR: 3.17
Prothrombin Time: 33.2 seconds — ABNORMAL HIGH (ref 11.4–15.2)

## 2016-11-12 ENCOUNTER — Encounter: Payer: Medicare Other | Admitting: Surgery

## 2016-11-12 ENCOUNTER — Other Ambulatory Visit: Payer: Self-pay | Admitting: *Deleted

## 2016-11-12 ENCOUNTER — Encounter (HOSPITAL_COMMUNITY)
Admission: RE | Admit: 2016-11-12 | Discharge: 2016-11-12 | Disposition: A | Discharge status: Home / Self Care | Payer: Medicare Other | Source: Skilled Nursing Facility | Attending: Internal Medicine | Admitting: Internal Medicine

## 2016-11-12 ENCOUNTER — Encounter: Payer: Self-pay | Admitting: Internal Medicine

## 2016-11-12 ENCOUNTER — Non-Acute Institutional Stay (SKILLED_NURSING_FACILITY): Payer: Medicare Other | Admitting: Internal Medicine

## 2016-11-12 DIAGNOSIS — I4891 Unspecified atrial fibrillation: Secondary | ICD-10-CM

## 2016-11-12 DIAGNOSIS — Z992 Dependence on renal dialysis: Secondary | ICD-10-CM | POA: Diagnosis not present

## 2016-11-12 DIAGNOSIS — F411 Generalized anxiety disorder: Secondary | ICD-10-CM | POA: Insufficient documentation

## 2016-11-12 DIAGNOSIS — I5033 Acute on chronic diastolic (congestive) heart failure: Secondary | ICD-10-CM | POA: Diagnosis not present

## 2016-11-12 DIAGNOSIS — N186 End stage renal disease: Secondary | ICD-10-CM | POA: Diagnosis not present

## 2016-11-12 DIAGNOSIS — J069 Acute upper respiratory infection, unspecified: Secondary | ICD-10-CM

## 2016-11-12 LAB — BASIC METABOLIC PANEL
Anion gap: 11 (ref 5–15)
BUN: 46 mg/dL — AB (ref 6–20)
CHLORIDE: 98 mmol/L — AB (ref 101–111)
CO2: 25 mmol/L (ref 22–32)
Calcium: 9.2 mg/dL (ref 8.9–10.3)
Creatinine, Ser: 7.21 mg/dL — ABNORMAL HIGH (ref 0.44–1.00)
GFR calc Af Amer: 5 mL/min — ABNORMAL LOW (ref >60)
GFR calc non Af Amer: 5 mL/min — ABNORMAL LOW (ref >60)
Glucose, Bld: 89 mg/dL (ref 65–99)
Potassium: 4.6 mmol/L (ref 3.5–5.1)
Sodium: 134 mmol/L — ABNORMAL LOW (ref 135–145)

## 2016-11-12 LAB — CBC
HEMATOCRIT: 33.6 % — AB (ref 36.0–46.0)
HEMOGLOBIN: 10.7 g/dL — AB (ref 12.0–15.0)
MCH: 30.5 pg (ref 26.0–34.0)
MCHC: 31.8 g/dL (ref 30.0–36.0)
MCV: 95.7 fL (ref 78.0–100.0)
Platelets: 435 10*3/uL — ABNORMAL HIGH (ref 150–400)
RBC: 3.51 MIL/uL — ABNORMAL LOW (ref 3.87–5.11)
RDW: 14 % (ref 11.5–15.5)
WBC: 16.5 10*3/uL — ABNORMAL HIGH (ref 4.0–10.5)

## 2016-11-12 LAB — PROTIME-INR
INR: 2.85
Prothrombin Time: 30.5 seconds — ABNORMAL HIGH (ref 11.4–15.2)

## 2016-11-12 MED ORDER — ALPRAZOLAM 0.5 MG PO TABS: 0.5000 mg | ORAL_TABLET | Freq: Three times a day (TID) | ORAL | 0 refills | Status: DC | PRN

## 2016-11-12 NOTE — Progress Notes (Signed)
Provider: Einar Crow  Location:   [facility] Room Number: 119/P Place of Service:  SNF (31)  PCP: [name]., MD Patient Care Team: [name], MD as PCP - General (Internal Medicine) [name], MD as Attending Physician (Nephrology)  Extended Emergency Contact Information Primary Emergency Contact: Torrance,Willie Address: [address]          [place], [place] [address] [place] Home Phone: [phone] Relation: Spouse Secondary Emergency Contact: Gweneth Dimitri, [place] [address] [place] Home Phone: [phone] Relation: Daughter  Code Status: Full Code Goals of Care: Advanced Directive information Advanced Directives 11/12/2016  Does Patient Have a Medical Advance Directive? Yes  Type of Advance Directive (No Data)  Does patient want to make changes to medical advance directive? No - Patient declined  Would patient like information on creating a medical advance directive? No - Patient declined  Pre-existing out of facility DNR order (yellow form or pink MOST form) -      Chief Complaint  Patient presents with  . Readmit To SNF    HPI: Patient is a 83 y.o. female seen today for readmission to SNF for Therapy.  Patient is ESRD due to Hypertensin on Hemodialysis Since 2013, Anemia , and Diastolic CHF Patient was admitted initially in the hospital with New onset Atrial Fibrillation. She was started on Cardizem and Coumadin. But in facility She was found to be Tachycardic and was sent to the ED again for Evaluation. This time she was admitted again and her Cardizem is  Increased to  every Six hours. She is also on Started on  Metoprolol. Patient still c/o Cough with Productive Sputum. No Chest pain or fever. Patient is not having any palpitations. Still feels SOB especially With Therapy and walking.   Past Medical History:  Diagnosis Date  . Anemia 02/11/2012  . Anxiety   . Atrial  fibrillation (HCC)   . CHF (congestive heart failure) (HCC)   . Chronic kidney disease    not on dialysis yet, Tues, thurs, sat  . GERD (gastroesophageal reflux disease)   . H/O metabolic acidosis   . Hyperlipidemia   . Hyperparathyroidism, secondary (HCC)   . Hypertension    Dr. Regino Schultze, [place], [place]  . Oxygen dependent   . Vitamin D deficiency    Past Surgical History:  Procedure Laterality Date  . ABDOMINAL HYSTERECTOMY    . AV FISTULA PLACEMENT  04/01/2012   Procedure: ARTERIOVENOUS (AV) FISTULA CREATION;  Surgeon: Sherren Kerns, MD;  Location: [facility] OR;  Service: Vascular;  Laterality: Left;  . CHOLECYSTECTOMY    . COLON SURGERY     for a blockage  . EYE SURGERY     bilateral cataracts, /w IOL - 2013  . FISTULOGRAM N/A 11/05/2014   Procedure: FISTULOGRAM;  Surgeon: Larina Earthly, MD;  Location: [facility] CATH LAB;  Service: Cardiovascular;  Laterality: N/A;  . INSERTION OF DIALYSIS CATHETER  04/10/2012   Procedure: INSERTION OF DIALYSIS CATHETER;  Surgeon: Pryor Ochoa, MD;  Location[facility] OR;  Service: Vascular;  Laterality: N/A;  Insertion Diatek Catheter Right Internal Jugular  . LIGATION OF COMPETING BRANCHES OF ARTERIOVENOUS FISTULA  11/07/2012   Procedure: LIGATION OF COMPETING BRANCHES OF ARTERIOVENOUS FISTULA;  Surgeon: Chuck Hint, MD;  Location: [facility] OR;  Service: Vascular;  Laterality: Left;  Brachio/Cephalic Fistula  . SHUNTOGRAM N/A 11/03/2012   Procedure: Betsey Amen;  Surgeon: Chuck Hint, MD;  Location: [facility] CATH LAB;  Service: Cardiovascular;  Laterality: N/A;  . SHUNTOGRAM Left 03/09/2013   Procedure: Fistulogram;  Surgeon: Fransisco Hertz, MD;  Location: [facility] CATH LAB;  Service: Cardiovascular;  Laterality: Left;  . SHUNTOGRAM Left 11/23/2013   Procedure: FISTULOGRAM;  Surgeon: Fransisco Hertz, MD;  Location: [facility] CATH LAB;  Service: Cardiovascular;  Laterality: Left;    reports that she has never smoked. She has never used smokeless tobacco. She reports that she does  not drink alcohol or use drugs. Social History   Social History  . Marital status: Married    Spouse name: N/A  . Number of children: N/A  . Years of education: N/A   Occupational History  . Not on file.   Social History Main Topics  . Smoking status: Never Smoker  . Smokeless tobacco: Never Used  . Alcohol use No  . Drug use: No  . Sexual activity: Not on file   Other Topics Concern  . Not on file   Social History Narrative  . No narrative on file    Functional Status Survey:    Family History  Problem Relation Age of Onset  . Stroke Mother   . Cancer Father   . Heart attack Father     Health Maintenance  Topic Date Due  . TETANUS/TDAP  11/21/1949  . ZOSTAVAX  11/21/1990  . DEXA SCAN  11/22/1995  . PNA vac Low Risk Adult (2 of 2 - PCV13) 04/10/2017  . INFLUENZA VACCINE  Completed    No Known Allergies  Allergies as of 11/12/2016   No Known Allergies     Medication List       Accurate as of 11/12/16 10:46 AM. Always use your most recent med list.          albuterol 108 (90 Base) MCG/ACT inhaler Commonly known as:  PROVENTIL HFA;VENTOLIN HFA Inhale 2 puffs into the lungs every 6 (six) hours as needed for wheezing or shortness of breath.   ALPRAZolam 0.5 MG tablet Commonly known as:  XANAX Take 1 tablet by mouth 3 (three) times daily as needed for anxiety.   Biotin 1 MG Caps Take 1 mg by mouth daily.   cinacalcet 30 MG tablet Commonly known as:  SENSIPAR Take 30 mg by mouth at bedtime.   diltiazem 90 MG tablet Commonly known as:  CARDIZEM Take 1 tablet (90 mg total) by mouth every 6 (six) hours.   guaiFENesin 600 MG 12 hr tablet Commonly known as:  MUCINEX Take 1 tablet (600 mg total) by mouth 2 (two) times daily.   ipratropium-albuterol 0.5-2.5 (3) MG/3ML Soln Commonly known as:  DUONEB Take 3 mLs by nebulization every 6 (six) hours as needed.   lidocaine-prilocaine cream Commonly known as:  EMLA Apply 1 application topically as  needed (for IV access).   metoprolol tartrate 25 MG tablet Commonly known as:  LOPRESSOR Take 1 tablet (25 mg total) by mouth 2 (two) times daily.   multivitamin Tabs tablet Take 1 tablet by mouth daily.   pantoprazole 40 MG tablet Commonly known as:  PROTONIX Take 1 tablet by mouth daily.   pentoxifylline 400 MG CR tablet Commonly known as:  TRENTAL Take 400 mg by mouth 3 (three) times daily with meals.   promethazine 25 MG tablet Commonly known as:  PHENERGAN Take 25 mg by mouth every 6 (six) hours as needed for nausea or vomiting.   sevelamer carbonate 800 MG tablet Commonly known as:  RENVELA Take 800-2,400  mg by mouth 3 (three) times daily with meals. Take 800mg  with snacks daily.   simvastatin 10 MG tablet Commonly known as:  ZOCOR Take 10 mg by mouth at bedtime.       Review of Systems  Constitutional: Negative for activity change, appetite change, chills and fever.  HENT: Negative for postnasal drip, rhinorrhea and sinus pain.   Respiratory: Positive for cough and shortness of breath. Negative for chest tightness.   Cardiovascular: Positive for leg swelling. Negative for chest pain and palpitations.  Gastrointestinal: Negative for abdominal distention, abdominal pain, constipation, diarrhea and nausea.  Genitourinary: Negative for dysuria and frequency.  Musculoskeletal: Negative.   Skin: Negative.   Neurological: Positive for weakness. Negative for tremors, syncope, light-headedness and headaches.  Psychiatric/Behavioral: Positive for sleep disturbance. Negative for confusion, decreased concentration and dysphoric mood.    Vitals:   11/12/16 1045  BP: 124/71  Pulse: (!) 134 Counted Manually  by Me 77/min.  Resp: 18  Temp: 98.9 F (37.2 C)  TempSrc: Oral   There is no height or weight on file to calculate BMI. Physical Exam  Constitutional: She is oriented to person, place, and time. She appears well-nourished.  HENT:  Head: Normocephalic.    Mouth/Throat: Oropharynx is clear and moist.  Eyes: Pupils are equal, round, and reactive to light.  Cardiovascular: Normal rate and normal heart sounds.  An irregularly irregular rhythm present.  No murmur heard. Pulmonary/Chest: Effort normal.  Patient has B/L expiratory Wheezing.  Abdominal: Soft. Bowel sounds are normal. She exhibits no distension. There is no tenderness. There is no rebound.  Musculoskeletal: She exhibits edema.  Lymphadenopathy:    She has no cervical adenopathy.  Neurological: She is alert and oriented to person, place, and time.  Good Strength in All extremities.  Skin: Skin is warm and dry.  Psychiatric: She has a normal mood and affect. Her behavior is normal. Judgment and thought content normal.    Labs reviewed: Basic Metabolic Panel:  Recent Labs  40/98/11 1626 11/05/16 0630 11/07/16 0559 11/08/16 1904 11/09/16 0420 11/12/16 0700  NA 130* 130* 132* 134* 134* 134*  K 4.7 5.0 4.0 3.7 3.9 4.6  CL 90* 90* 93* 92* 95* 98*  CO2 28 26 28 29 29 25   GLUCOSE 115* 97 108* 140* 111* 89  BUN 60* 73* 52* 27* 33* 46*  CREATININE 8.26* 9.25* 7.24* 4.49* 5.33* 7.21*  CALCIUM 8.6* 8.3* 9.2 9.8 9.2 9.2  PHOS 5.5* 6.5* 4.4  --   --   --    Liver Function Tests:  Recent Labs  11/01/16 0655  11/07/16 0559 11/08/16 1904 11/09/16 0420  AST 28  --   --  23 18  ALT 33  --   --  19 17  ALKPHOS 66  --   --  66 53  BILITOT 1.3*  --   --  1.0 1.0  PROT 7.0  --   --  7.8 6.4*  ALBUMIN 3.3*  < > 2.7* 3.2* 2.7*  < > = values in this interval not displayed. No results for input(s): LIPASE, AMYLASE in the last 8760 hours. No results for input(s): AMMONIA in the last 8760 hours. CBC:  Recent Labs  10/29/16 0740  11/01/16 0655  11/08/16 1904 11/09/16 0420 11/12/16 0700  WBC 12.8*  < > 12.6*  < > 19.0* 16.0* 16.5*  NEUTROABS 10.1*  --  10.5*  --  14.4*  --   --   HGB 10.6*  < > 10.6*  < >  11.9* 10.8* 10.7*  HCT 33.8*  < > 33.8*  < > 37.7 34.2* 33.6*  MCV  97.4  < > 96.0  < > 94.7 95.3 95.7  PLT 247  < > 216  < > 530* 444* 435*  < > = values in this interval not displayed. Cardiac Enzymes:  Recent Labs  11/08/16 2200 11/09/16 0420 11/09/16 0944  TROPONINI 0.05* 0.05* 0.04*   BNP: Invalid input(s): POCBNP No results found for: HGBA1C Lab Results  Component Value Date   TSH 0.836 11/09/2016   No results found for: VITAMINB12 No results found for: FOLATE No results found for: IRON, TIBC, FERRITIN  Imaging and Procedures obtained prior to SNF admission: No results found.  Assessment/Plan  Atrial fibrillation with RVR (HCC) Patient Rate Controlled on High dose of Cardizem and Metoprolol. Her Coumadin is on hold as Her INR is elevated. Will Continue to hold. Repeat INR tomorrow with Dialysis. She has Follow up with cardiology.  Acute on chronic diastolic CHF (congestive heart failure) (HCC) Patient on Dialysis. Has repeat Cxray follow up for Her Pulmonary edema in 1 weak.  Chronic Bronchitis She has cough but C Xray in the hospital was negative but patient also has Lekocytosis. Will start her on Levaquin 250 mg QOD for 7 Doses. She was on Doxycycline in hospital but never finished the course.  also continue the Duo Nebs.   ESRD (end stage renal disease) (HCC) On Dialysis follows with Nephrologist.   Generalized anxiety disorder Patient is on PRN Ativan. She says she uses it occassionally. Will keep her on it for 2 weeks and then Re Eval.     Family/ staff Communication:   Labs/tests ordered: INR,  Total time spent in this patient care encounter was 35_ minutes; greater than 50% of the visit spent counseling patient and coordinating care for problems addressed at this encounter.

## 2016-11-12 NOTE — Telephone Encounter (Signed)
[facility] #[phone] Fax: [phone]   

## 2016-11-13 ENCOUNTER — Encounter (HOSPITAL_COMMUNITY)
Admission: RE | Admit: 2016-11-13 | Discharge: 2016-11-13 | Disposition: A | Discharge status: Home / Self Care | Payer: Medicare Other | Source: Skilled Nursing Facility

## 2016-11-13 DIAGNOSIS — Z992 Dependence on renal dialysis: Secondary | ICD-10-CM | POA: Diagnosis not present

## 2016-11-13 DIAGNOSIS — N186 End stage renal disease: Secondary | ICD-10-CM | POA: Diagnosis not present

## 2016-11-13 LAB — PROTIME-INR
INR: 2.58
Prothrombin Time: 28.1 seconds — ABNORMAL HIGH (ref 11.4–15.2)

## 2016-11-14 ENCOUNTER — Other Ambulatory Visit (HOSPITAL_COMMUNITY)
Admission: AD | Admit: 2016-11-14 | Discharge: 2016-11-14 | Disposition: A | Discharge status: Home / Self Care | Payer: Medicare Other | Source: Skilled Nursing Facility | Attending: Internal Medicine | Admitting: Internal Medicine

## 2016-11-14 DIAGNOSIS — I4891 Unspecified atrial fibrillation: Secondary | ICD-10-CM | POA: Insufficient documentation

## 2016-11-14 LAB — PROTIME-INR
INR: 1.47
Prothrombin Time: 18 seconds — ABNORMAL HIGH (ref 11.4–15.2)

## 2016-11-15 DIAGNOSIS — N186 End stage renal disease: Secondary | ICD-10-CM | POA: Diagnosis not present

## 2016-11-15 DIAGNOSIS — Z992 Dependence on renal dialysis: Secondary | ICD-10-CM | POA: Diagnosis not present

## 2016-11-16 ENCOUNTER — Encounter (HOSPITAL_COMMUNITY)
Admission: RE | Admit: 2016-11-16 | Discharge: 2016-11-16 | Disposition: A | Discharge status: Home / Self Care | Payer: Medicare Other | Source: Skilled Nursing Facility | Attending: Internal Medicine | Admitting: Internal Medicine

## 2016-11-16 DIAGNOSIS — K219 Gastro-esophageal reflux disease without esophagitis: Secondary | ICD-10-CM | POA: Insufficient documentation

## 2016-11-16 DIAGNOSIS — J069 Acute upper respiratory infection, unspecified: Secondary | ICD-10-CM | POA: Insufficient documentation

## 2016-11-16 DIAGNOSIS — N2581 Secondary hyperparathyroidism of renal origin: Secondary | ICD-10-CM | POA: Insufficient documentation

## 2016-11-16 DIAGNOSIS — M6281 Muscle weakness (generalized): Secondary | ICD-10-CM | POA: Insufficient documentation

## 2016-11-16 DIAGNOSIS — N186 End stage renal disease: Secondary | ICD-10-CM | POA: Insufficient documentation

## 2016-11-17 ENCOUNTER — Encounter (HOSPITAL_COMMUNITY)
Admission: RE | Admit: 2016-11-17 | Discharge: 2016-11-17 | Disposition: A | Discharge status: Home / Self Care | Payer: Medicare Other | Source: Skilled Nursing Facility | Attending: *Deleted | Admitting: *Deleted

## 2016-11-17 DIAGNOSIS — Z992 Dependence on renal dialysis: Secondary | ICD-10-CM | POA: Diagnosis not present

## 2016-11-17 DIAGNOSIS — N186 End stage renal disease: Secondary | ICD-10-CM | POA: Diagnosis not present

## 2016-11-17 LAB — PROTIME-INR
INR: 1.73
PROTHROMBIN TIME: 20.5 s — AB (ref 11.4–15.2)

## 2016-11-18 ENCOUNTER — Encounter (HOSPITAL_COMMUNITY)
Admission: RE | Admit: 2016-11-18 | Discharge: 2016-11-18 | Disposition: A | Discharge status: Home / Self Care | Payer: Medicare Other | Source: Skilled Nursing Facility | Attending: Pediatrics | Admitting: Pediatrics

## 2016-11-18 DIAGNOSIS — I4891 Unspecified atrial fibrillation: Secondary | ICD-10-CM | POA: Insufficient documentation

## 2016-11-18 LAB — PROTIME-INR
INR: 1.96
Prothrombin Time: 22.6 seconds — ABNORMAL HIGH (ref 11.4–15.2)

## 2016-11-19 ENCOUNTER — Encounter (HOSPITAL_COMMUNITY)
Admission: RE | Admit: 2016-11-19 | Discharge: 2016-11-19 | Disposition: A | Discharge status: Home / Self Care | Payer: Medicare Other | Source: Skilled Nursing Facility | Attending: Internal Medicine | Admitting: Internal Medicine

## 2016-11-19 DIAGNOSIS — N186 End stage renal disease: Secondary | ICD-10-CM | POA: Insufficient documentation

## 2016-11-19 DIAGNOSIS — J069 Acute upper respiratory infection, unspecified: Secondary | ICD-10-CM | POA: Insufficient documentation

## 2016-11-19 DIAGNOSIS — M6281 Muscle weakness (generalized): Secondary | ICD-10-CM | POA: Insufficient documentation

## 2016-11-19 DIAGNOSIS — N2581 Secondary hyperparathyroidism of renal origin: Secondary | ICD-10-CM | POA: Insufficient documentation

## 2016-11-19 DIAGNOSIS — K219 Gastro-esophageal reflux disease without esophagitis: Secondary | ICD-10-CM | POA: Insufficient documentation

## 2016-11-19 LAB — PROTIME-INR
INR: 2.11
Prothrombin Time: 24 seconds — ABNORMAL HIGH (ref 11.4–15.2)

## 2016-11-20 ENCOUNTER — Encounter (HOSPITAL_COMMUNITY)
Admission: RE | Admit: 2016-11-20 | Discharge: 2016-11-20 | Disposition: A | Discharge status: Home / Self Care | Payer: Medicare Other | Source: Skilled Nursing Facility | Attending: Pediatrics | Admitting: Pediatrics

## 2016-11-20 DIAGNOSIS — J069 Acute upper respiratory infection, unspecified: Secondary | ICD-10-CM | POA: Insufficient documentation

## 2016-11-20 DIAGNOSIS — K219 Gastro-esophageal reflux disease without esophagitis: Secondary | ICD-10-CM | POA: Insufficient documentation

## 2016-11-20 DIAGNOSIS — N2581 Secondary hyperparathyroidism of renal origin: Secondary | ICD-10-CM | POA: Insufficient documentation

## 2016-11-20 DIAGNOSIS — Z992 Dependence on renal dialysis: Secondary | ICD-10-CM | POA: Diagnosis not present

## 2016-11-20 DIAGNOSIS — N186 End stage renal disease: Secondary | ICD-10-CM | POA: Insufficient documentation

## 2016-11-20 DIAGNOSIS — M6281 Muscle weakness (generalized): Secondary | ICD-10-CM | POA: Insufficient documentation

## 2016-11-20 DIAGNOSIS — I4891 Unspecified atrial fibrillation: Secondary | ICD-10-CM | POA: Insufficient documentation

## 2016-11-21 ENCOUNTER — Encounter (HOSPITAL_COMMUNITY)
Admission: RE | Admit: 2016-11-21 | Discharge: 2016-11-21 | Disposition: A | Discharge status: Home / Self Care | Payer: Medicare Other | Source: Skilled Nursing Facility | Attending: Internal Medicine | Admitting: Internal Medicine

## 2016-11-21 DIAGNOSIS — K219 Gastro-esophageal reflux disease without esophagitis: Secondary | ICD-10-CM | POA: Insufficient documentation

## 2016-11-21 DIAGNOSIS — J069 Acute upper respiratory infection, unspecified: Secondary | ICD-10-CM | POA: Insufficient documentation

## 2016-11-21 DIAGNOSIS — N186 End stage renal disease: Secondary | ICD-10-CM | POA: Insufficient documentation

## 2016-11-21 DIAGNOSIS — M6281 Muscle weakness (generalized): Secondary | ICD-10-CM | POA: Insufficient documentation

## 2016-11-21 DIAGNOSIS — N2581 Secondary hyperparathyroidism of renal origin: Secondary | ICD-10-CM | POA: Insufficient documentation

## 2016-11-21 DIAGNOSIS — I4891 Unspecified atrial fibrillation: Secondary | ICD-10-CM | POA: Insufficient documentation

## 2016-11-22 ENCOUNTER — Other Ambulatory Visit (HOSPITAL_COMMUNITY)
Admission: AD | Admit: 2016-11-22 | Discharge: 2016-11-22 | Disposition: A | Discharge status: Home / Self Care | Payer: Medicare Other | Source: Skilled Nursing Facility | Attending: Internal Medicine | Admitting: Internal Medicine

## 2016-11-22 DIAGNOSIS — N186 End stage renal disease: Secondary | ICD-10-CM | POA: Diagnosis not present

## 2016-11-22 DIAGNOSIS — I4891 Unspecified atrial fibrillation: Secondary | ICD-10-CM | POA: Insufficient documentation

## 2016-11-22 DIAGNOSIS — Z992 Dependence on renal dialysis: Secondary | ICD-10-CM | POA: Diagnosis not present

## 2016-11-22 LAB — PROTIME-INR
INR: 1.93
Prothrombin Time: 22.3 seconds — ABNORMAL HIGH (ref 11.4–15.2)

## 2016-11-24 DIAGNOSIS — N186 End stage renal disease: Secondary | ICD-10-CM | POA: Diagnosis not present

## 2016-11-24 DIAGNOSIS — Z992 Dependence on renal dialysis: Secondary | ICD-10-CM | POA: Diagnosis not present

## 2016-11-26 ENCOUNTER — Encounter (HOSPITAL_COMMUNITY)
Admission: RE | Admit: 2016-11-26 | Discharge: 2016-11-26 | Disposition: A | Discharge status: Home / Self Care | Payer: Medicare Other | Source: Skilled Nursing Facility | Attending: *Deleted | Admitting: *Deleted

## 2016-11-26 LAB — PROTIME-INR
INR: 1.5
PROTHROMBIN TIME: 18.3 s — AB (ref 11.4–15.2)

## 2016-11-27 ENCOUNTER — Encounter: Payer: Self-pay | Admitting: Internal Medicine

## 2016-11-27 ENCOUNTER — Non-Acute Institutional Stay (SKILLED_NURSING_FACILITY): Payer: Medicare Other | Admitting: Internal Medicine

## 2016-11-27 DIAGNOSIS — N186 End stage renal disease: Secondary | ICD-10-CM

## 2016-11-27 DIAGNOSIS — Z992 Dependence on renal dialysis: Secondary | ICD-10-CM | POA: Diagnosis not present

## 2016-11-27 DIAGNOSIS — I4891 Unspecified atrial fibrillation: Secondary | ICD-10-CM | POA: Diagnosis not present

## 2016-11-27 DIAGNOSIS — I5033 Acute on chronic diastolic (congestive) heart failure: Secondary | ICD-10-CM

## 2016-11-27 DIAGNOSIS — I10 Essential (primary) hypertension: Secondary | ICD-10-CM

## 2016-11-27 NOTE — Progress Notes (Signed)
Location:   [facility] Room Number: 119/P Place of Service:  SNF (31)  Provider: Edmon Crape  PCP: [name]., MD Patient Care Team: [name], MD as PCP - General (Internal Medicine) [name], MD as Attending Physician (Nephrology)  Extended Emergency Contact Information Primary Emergency Contact: Capaldi,Willie Address: [address]          [place], [place] [address] [place] Home Phone: [phone] Relation: Spouse Secondary Emergency Contact: Calandra, [place], [place] [address] [place] Home Phone: [phone] Relation: Daughter  Code Status: Full Code Goals of care:  Advanced Directive information Advanced Directives 11/27/2016  Does Patient Have a Medical Advance Directive? Yes  Type of Advance Directive (No Data)  Does patient want to make changes to medical advance directive? No - Patient declined  Would patient like information on creating a medical advance directive? No - Patient declined  Pre-existing out of facility DNR order (yellow form or pink MOST form) -     No Known Allergies  Chief Complaint  Patient presents with  . Discharge Note    HPI:  83 y.o. female  seen today for discharge from facility.  Patient is receiving dialysis second [place] to end-stage renal disease secondary to hypertension.  She also has a history of anemia and diastolic CHF.  She was initially admitted to the hospital with new-onset atrial fibrillation and was started on Cardizem and Coumadin.  She did come to the facility but we shortly found to be tachycardic and was sent to the emergency department again for evaluation-she was admitted 82 and her Cardizem was increased every 6 hours she was also started on metoprolol.  This appears to have stabilized pulse today is in the 90s I do not see consistent elevations above 100 --  She is on Coumadin most recent INR was 1.5 adjustments were made and  we will update an INR tomorrow.    Since her return to skilled nursing she has been treated for chronic bronchitis with Levaquin-she says her cough is considerably better does not complain of shortness of breath she does have when necessary nebulizers please also on Mucinex 600 mg twice a day  She will be going home with her family which has been very supportive she does live with her spouse.  She will continue to need a nebulizer as well as rolling walker for ambulation for therapy she is ambulating quite well getting up when using the bathroom unassisted.  She would benefit from further PT and OT also nursing support for her multiple medical issues      Past Medical History:  Diagnosis Date  . Anemia 02/11/2012  . Anxiety   . Atrial fibrillation (HCC)   . CHF (congestive heart failure) (HCC)   . Chronic kidney disease    not on dialysis yet, Tues, thurs, sat  . GERD (gastroesophageal reflux disease)   . H/O metabolic acidosis   . Hyperlipidemia   . Hyperparathyroidism, secondary (HCC)   . Hypertension    Dr. Regino Schultze, [place], [place]  . Oxygen dependent   . Vitamin D deficiency     Past Surgical History:  Procedure Laterality Date  . ABDOMINAL HYSTERECTOMY    . AV FISTULA PLACEMENT  04/01/2012   Procedure: ARTERIOVENOUS (AV) FISTULA CREATION;  Surgeon: Sherren Kerns, MD;  Location: [facility] OR;  Service: Vascular;  Laterality: Left;  . CHOLECYSTECTOMY    . COLON SURGERY  for a blockage  . EYE SURGERY     bilateral cataracts, /w IOL - 2013  . FISTULOGRAM N/A 11/05/2014   Procedure: FISTULOGRAM;  Surgeon: Larina Earthly, MD;  Location: [facility] CATH LAB;  Service: Cardiovascular;  Laterality: N/A;  . INSERTION OF DIALYSIS CATHETER  04/10/2012   Procedure: INSERTION OF DIALYSIS CATHETER;  Surgeon: Pryor Ochoa, MD;  Location: [facility] OR;  Service: Vascular;  Laterality: N/A;  Insertion Diatek Catheter Right Internal Jugular  . LIGATION OF COMPETING BRANCHES OF ARTERIOVENOUS FISTULA   11/07/2012   Procedure: LIGATION OF COMPETING BRANCHES OF ARTERIOVENOUS FISTULA;  Surgeon: Chuck Hint, MD;  Location: [facility] OR;  Service: Vascular;  Laterality: Left;  Brachio/Cephalic Fistula  . SHUNTOGRAM N/A 11/03/2012   Procedure: Betsey Amen;  Surgeon: Chuck Hint, MD;  Location: [facility] CATH LAB;  Service: Cardiovascular;  Laterality: N/A;  . SHUNTOGRAM Left 03/09/2013   Procedure: Fistulogram;  Surgeon: Fransisco Hertz, MD;  Location: [facility] CATH LAB;  Service: Cardiovascular;  Laterality: Left;  . SHUNTOGRAM Left 11/23/2013   Procedure: FISTULOGRAM;  Surgeon: Fransisco Hertz, MD;  Location: [facility] CATH LAB;  Service: Cardiovascular;  Laterality: Left;      reports that she has never smoked. She has never used smokeless tobacco. She reports that she does not drink alcohol or use drugs. Social History   Social History  . Marital status: Married    Spouse name: N/A  . Number of children: N/A  . Years of education: N/A   Occupational History  . Not on file.   Social History Main Topics  . Smoking status: Never Smoker  . Smokeless tobacco: Never Used  . Alcohol use No  . Drug use: No  . Sexual activity: Not on file   Other Topics Concern  . Not on file   Social History Narrative  . No narrative on file   Functional Status Survey:    No Known Allergies  Pertinent  Health Maintenance Due  Topic Date Due  . DEXA SCAN  11/22/1995  . PNA vac Low Risk Adult (2 of 2 - PCV13) 04/10/2017  . INFLUENZA VACCINE  Completed    Medications: Current Outpatient Prescriptions on File Prior to Visit  Medication Sig Dispense Refill  . Biotin 1 MG CAPS Take 1 mg by mouth daily.    . cinacalcet (SENSIPAR) 30 MG tablet Take 30 mg by mouth at bedtime.    Marland Kitchen diltiazem (CARDIZEM) 90 MG tablet Take 1 tablet (90 mg total) by mouth every 6 (six) hours. 90 tablet 0  . guaiFENesin (MUCINEX) 600 MG 12 hr tablet Take 1 tablet (600 mg total) by mouth 2 (two) times daily. 30 tablet 0  .  ipratropium-albuterol (DUONEB) 0.5-2.5 (3) MG/3ML SOLN Take 3 mLs by nebulization every 6 (six) hours as needed. 360 mL 0  . lidocaine-prilocaine (EMLA) cream Apply 1 application topically as needed (for IV access).     . metoprolol tartrate (LOPRESSOR) 25 MG tablet Take 1 tablet (25 mg total) by mouth 2 (two) times daily. 60 tablet 0  . multivitamin (RENA-VIT) TABS tablet Take 1 tablet by mouth daily.    . pantoprazole (PROTONIX) 40 MG tablet Take 1 tablet by mouth daily.    . pentoxifylline (TRENTAL) 400 MG CR tablet Take 400 mg by mouth 3 (three) times daily with meals.    . promethazine (PHENERGAN) 25 MG tablet Take 25 mg by mouth every 6 (six) hours as needed for nausea or vomiting.    Marland Kitchen  sevelamer carbonate (RENVELA) 800 MG tablet Take 800-2,400 mg by mouth 3 (three) times daily with meals. Take 800mg  with snacks daily.     . simvastatin (ZOCOR) 10 MG tablet Take 10 mg by mouth at bedtime.      No current facility-administered medications on file prior to visit.      Review of Systems    Gen. she says she feels stronger is not complaining of any fever or chills says her cough and breathing have improved status post treatment for bronchitis.  Skin does not complain of rashes or itching.  Head ears eyes nose mouth and throat does not complaining of any sore throat or visual changes.  Respiratory again says her cough and shortness of breath have largely resolved since she feeling much better than this regard.  Cardiac does not complaining of chest pain does have some lower extremity edema apparently baseline.  GI is not complaining of any nausea vomiting diarrhea constipation or abdominal discomfort.  GU does not complaining of dysuria is receiving dialysis.  Musculoskeletal is not complaining of joint pain appears to have gained strength.  Neurologic is not complaining of dizziness or headache or syncope.  Psych appears to be in good spirits bright alert does not complain of  depression or anxiety  He is afebrile pulse 90 respirations 18 recent blood  pressures 128/64-143/67-143/89-110/57  Physical Exam  Constitutional: She is oriented to person, place, and time. She appears well-nourished. Lying in bed comfortably.  Her skin is warm and dry bruit is appreciated around shunt in her l upper arm HENT:  Head: Normocephalic.  Mouth/Throat: Oropharynx is clear and moist.  Eyes: Pupils are equal, round, and reactive to light.  Cardiovascular: Normal rate and normal heart sounds.  An irregularly irregular rhythm present. Pulses 90's  No murmur heard. Pulmonary/Chest:   clear to auscultation there is no labored breathing   Abdominal: Soft. Bowel sounds are normal. She exhibits no distension. There is no tenderness. There is no rebound.  Musculoskeletal: She exhibits edema.   Neurological: She is alert and oriented to person, place, and time.  Good Strength in All extremities.  Skin: Skin is warm and dry.  Psychiatric: She has a normal mood and affect. Her behavior is normal. Judgment and thought content normal.   Labs reviewed: Basic Metabolic Panel:  Recent Labs  16/10/96 1626 11/05/16 0630 11/07/16 0559 11/08/16 1904 11/09/16 0420 11/12/16 0700  NA 130* 130* 132* 134* 134* 134*  K 4.7 5.0 4.0 3.7 3.9 4.6  CL 90* 90* 93* 92* 95* 98*  CO2 28 26 28 29 29 25   GLUCOSE 115* 97 108* 140* 111* 89  BUN 60* 73* 52* 27* 33* 46*  CREATININE 8.26* 9.25* 7.24* 4.49* 5.33* 7.21*  CALCIUM 8.6* 8.3* 9.2 9.8 9.2 9.2  PHOS 5.5* 6.5* 4.4  --   --   --    Liver Function Tests:  Recent Labs  11/01/16 0655  11/07/16 0559 11/08/16 1904 11/09/16 0420  AST 28  --   --  23 18  ALT 33  --   --  19 17  ALKPHOS 66  --   --  66 53  BILITOT 1.3*  --   --  1.0 1.0  PROT 7.0  --   --  7.8 6.4*  ALBUMIN 3.3*  < > 2.7* 3.2* 2.7*  < > = values in this interval not displayed. No results for input(s): LIPASE, AMYLASE in the last 8760 hours. No results for input(s):  AMMONIA in the last 8760 hours. CBC:  Recent Labs  10/29/16 0740  11/01/16 0655  11/08/16 1904 11/09/16 0420 11/12/16 0700  WBC 12.8*  < > 12.6*  < > 19.0* 16.0* 16.5*  NEUTROABS 10.1*  --  10.5*  --  14.4*  --   --   HGB 10.6*  < > 10.6*  < > 11.9* 10.8* 10.7*  HCT 33.8*  < > 33.8*  < > 37.7 34.2* 33.6*  MCV 97.4  < > 96.0  < > 94.7 95.3 95.7  PLT 247  < > 216  < > 530* 444* 435*  < > = values in this interval not displayed. Cardiac Enzymes:  Recent Labs  11/08/16 2200 11/09/16 0420 11/09/16 0944  TROPONINI 0.05* 0.05* 0.04*   BNP: Invalid input(s): POCBNP CBG: No results for input(s): GLUCAP in the last 8760 hours.  Procedures and Imaging Studies During Stay: US Venous Img Lower Bilateral  Result Date: 11/06/2016 CLINICAL DATA:  Pain and tenderness x2 months bilaterally EXAM: BILATERAL LOWER EXTREMITY VENOUS DOPPLER ULTRASOUND TECHNIQUE: Gray-scale sonography with compression, as well as color and duplex ultrasound, were performed to evaluate the deep venous system from the level of the common femoral vein through the popliteal and proximal calf veins. COMPARISON:  None FINDINGS: On the right, Normal compressibility of the common femoral, superficial femoral, and popliteal veins, as well as the proximal calf veins. No filling defects to suggest DVT on grayscale or color Doppler imaging. Doppler waveforms show normal direction of venous flow, normal respiratory phasicity and response to augmentation. On the left, there is eccentric partially calcified wall thickening in the popliteal vein, with resultant incomplete compressibility ; there is persistent color Doppler flow signal through this segment. More subtle wall thickening in the femoral vein is suspected, with normal color Doppler flow signal. Normal compressibility of the common femoral and deep femoral veins, as well is visualized calf veins. IMPRESSION: 1. No evidence of right lower extremity deep vein thrombosis. 2.  Probable chronic nonocclusive DVT in the left popliteal and femoral veins. No evidence of superimposed acute DVT. Electronically Signed   By: Corlis Leak M.D.   On: 11/06/2016 16:27   Dg Chest Port 1 View  Result Date: 11/08/2016 CLINICAL DATA:  Atrial fibrillation, shortness of breath and chest tightness. Nonproductive cough. EXAM: PORTABLE CHEST 1 VIEW COMPARISON:  11/02/2016 FINDINGS: Cardiomediastinal silhouette is enlarged and globular. Mediastinal contours appear intact. Calcific atherosclerotic disease and tortuosity of the aorta seen. There is no evidence of focal airspace consolidation, pleural effusion or pneumothorax. Mild pulmonary vascular congestion. Osseous structures are without acute abnormality. Soft tissues are grossly normal. IMPRESSION: Enlarged and globular cardiac silhouette. This may represent cardiomegaly or pericardial effusion. Please correlate clinically. Mild pulmonary vascular congestion. Calcific atherosclerotic disease of the aorta. Electronically Signed   By: Ted Mcalpine M.D.   On: 11/08/2016 19:18   Dg Chest Port 1 View  Result Date: 11/02/2016 CLINICAL DATA:  PICC line placement. EXAM: PORTABLE CHEST 1 VIEW COMPARISON:  Radiograph of November 01, 2016. FINDINGS: Stable cardiomegaly. Atherosclerosis of thoracic aorta is noted. Central pulmonary vascular congestion is noted. No pneumothorax or significant pleural effusion is noted. Probable mild bibasilar subsegmental atelectasis is noted. Bony thorax is unremarkable. Interval placement of right internal jugular catheter with distal tip in expected position of the SVC. IMPRESSION: Cardiomegaly. Aortic atherosclerosis. Stable central pulmonary vascular congestion. Probable bibasilar subsegmental atelectasis. Interval placement of right internal jugular catheter with distal tip in expected position of SVC. Electronically  Signed   By: Lupita Raider, M.D.   On: 11/02/2016 11:41   Dg Chest Port 1 View  Result Date:  11/01/2016 CLINICAL DATA:  Dyspnea this morning EXAM: PORTABLE CHEST 1 VIEW COMPARISON:  10/29/2016 FINDINGS: Marked cardiomegaly, unchanged. Mild vascular prominence. The vascular and interstitial pattern is improved from 10/29/2016. No airspace consolidation or significant alveolar edema. IMPRESSION: Improved from 10/29/2016.  Marked cardiomegaly persists. Electronically Signed   By: Ellery Plunk M.D.   On: 11/01/2016 06:56   Dg Chest Portable 1 View  Result Date: 10/29/2016 CLINICAL DATA:  woke up feeling sob this morning. --congested cough since arriving. --swelling to bilateral lower legs - EXAM: PORTABLE CHEST 1 VIEW COMPARISON:  09/26/2016 FINDINGS: Moderate enlargement of the cardiopericardial silhouette. No mediastinal or hilar masses. There is central vascular congestion and bilateral interstitial thickening with small pleural effusions. Findings are similar to the most recent prior exam consistent with congestive heart failure. No pneumothorax. IMPRESSION: 1. Congestive heart failure similar in appearance to the most recent prior chest radiograph. Electronically Signed   By: Amie Portland M.D.   On: 10/29/2016 08:03    Assessment/Plan:   #1 atrial fibrillation this appears rate controlled I do not see consistent pulse rates about 100-will need follow-up by cardiology which has been arranged-she continues on diltiazem 90 mg every 6 hours as well as metoprolol 25 mg twice a day-she is also on Coumadin for anticoagulation most recent INR was  1.5 we'll update this before discharge she is currently on 4.5 mg Coumadin a day  2 history of acute on chronic diastolic CHF-she continues dialysis-this appears clinically stable she says her breathing is better as well as cough.  Most recent chest x-ray did not show any acute process did not show any pleural effusion.  #3 history of chronic bronchitis skin she is completed antibiotic this appears stable she continues on Mucinex as well as when  necessary nebulizers.  #4 history of hyperlipidemia she continues on a statin-since her stay here is been quite short was not aggressive pursuing a lipid panel this will need follow-up by primary care provider.  #5 history of hyperparathyroidism she is on Sensipar this will need follow-up as an outpatient as well with dialysis nephrology.  #6 hypertension again she is on Cardizem and metoprolol blood pressures appear to be fairly stable-120/64-143/67 110/57 137/67 I do not see consistent elevations  #7-end-stage renal disease again she does continue on dialysis appears to be tolerating this fairly well  A will update lab work including an INR tomorrow also will check a BMP and CBC-I do note she's had some leukocytosis that apparently did not have a clear etiology in the hospital would like  update on this before discharg She has completed a course of Levaquin for a URI which may have contributed to the elevated white count.  e   Patient is being discharged with the following home health services:  PT and OT as well as nursing support  Patient is being discharged with the following durable medical equipment:  A nebulizer as well as a rolling walker  Patient has been advised to f/u with their PCP in 1-2 weeks to bring them up to date on their rehab stay.  Social services at facility was responsible for arranging this appointment.  Pt was provided with a 30 day supply of prescriptions for medications and refills must be obtained from their PCP.  For controlled substances, a more limited supply may be provided adequate until PCP  appointment only.  Future labs/tests needed: Will update CBC BMP and PT/INR tomorrow   [phone] note greater than 30 minutes spent on this discharge summary-greater than 50% of time spent coordinating plan of care for numerous diagnoses

## 2016-11-28 ENCOUNTER — Encounter (HOSPITAL_COMMUNITY)
Admission: RE | Admit: 2016-11-28 | Discharge: 2016-11-28 | Disposition: A | Discharge status: Home / Self Care | Payer: Medicare Other | Source: Skilled Nursing Facility | Attending: Internal Medicine | Admitting: Internal Medicine

## 2016-11-28 DIAGNOSIS — M6281 Muscle weakness (generalized): Secondary | ICD-10-CM | POA: Insufficient documentation

## 2016-11-28 DIAGNOSIS — N186 End stage renal disease: Secondary | ICD-10-CM | POA: Insufficient documentation

## 2016-11-28 DIAGNOSIS — K219 Gastro-esophageal reflux disease without esophagitis: Secondary | ICD-10-CM | POA: Insufficient documentation

## 2016-11-28 DIAGNOSIS — J069 Acute upper respiratory infection, unspecified: Secondary | ICD-10-CM | POA: Insufficient documentation

## 2016-11-28 DIAGNOSIS — Z992 Dependence on renal dialysis: Secondary | ICD-10-CM | POA: Diagnosis not present

## 2016-11-28 DIAGNOSIS — N2581 Secondary hyperparathyroidism of renal origin: Secondary | ICD-10-CM | POA: Insufficient documentation

## 2016-11-28 LAB — BASIC METABOLIC PANEL
Anion gap: 10 (ref 5–15)
BUN: 19 mg/dL (ref 6–20)
CHLORIDE: 96 mmol/L — AB (ref 101–111)
CO2: 30 mmol/L (ref 22–32)
Calcium: 9.1 mg/dL (ref 8.9–10.3)
Creatinine, Ser: 5.68 mg/dL — ABNORMAL HIGH (ref 0.44–1.00)
GFR calc Af Amer: 7 mL/min — ABNORMAL LOW (ref >60)
GFR, EST NON AFRICAN AMERICAN: 6 mL/min — AB (ref >60)
GLUCOSE: 109 mg/dL — AB (ref 65–99)
POTASSIUM: 3.5 mmol/L (ref 3.5–5.1)
Sodium: 136 mmol/L (ref 135–145)

## 2016-11-28 LAB — PROTIME-INR
INR: 1.53
Prothrombin Time: 18.6 seconds — ABNORMAL HIGH (ref 11.4–15.2)

## 2016-11-29 DIAGNOSIS — Z7901 Long term (current) use of anticoagulants: Secondary | ICD-10-CM | POA: Diagnosis not present

## 2016-11-29 DIAGNOSIS — N186 End stage renal disease: Secondary | ICD-10-CM | POA: Diagnosis not present

## 2016-11-29 DIAGNOSIS — Z992 Dependence on renal dialysis: Secondary | ICD-10-CM | POA: Diagnosis not present

## 2016-11-30 DIAGNOSIS — I4891 Unspecified atrial fibrillation: Secondary | ICD-10-CM | POA: Diagnosis not present

## 2016-11-30 DIAGNOSIS — I739 Peripheral vascular disease, unspecified: Secondary | ICD-10-CM | POA: Diagnosis not present

## 2016-11-30 DIAGNOSIS — J449 Chronic obstructive pulmonary disease, unspecified: Secondary | ICD-10-CM | POA: Diagnosis not present

## 2016-11-30 DIAGNOSIS — N185 Chronic kidney disease, stage 5: Secondary | ICD-10-CM | POA: Diagnosis not present

## 2016-12-01 DIAGNOSIS — N186 End stage renal disease: Secondary | ICD-10-CM | POA: Diagnosis not present

## 2016-12-01 DIAGNOSIS — Z992 Dependence on renal dialysis: Secondary | ICD-10-CM | POA: Diagnosis not present

## 2016-12-03 ENCOUNTER — Encounter: Payer: Self-pay | Admitting: Cardiovascular Disease

## 2016-12-03 ENCOUNTER — Ambulatory Visit (INDEPENDENT_AMBULATORY_CARE_PROVIDER_SITE_OTHER): Payer: Medicare Other | Admitting: *Deleted

## 2016-12-03 ENCOUNTER — Ambulatory Visit (INDEPENDENT_AMBULATORY_CARE_PROVIDER_SITE_OTHER): Payer: Medicare Other | Admitting: Cardiovascular Disease

## 2016-12-03 VITALS — BP 106/70 | HR 89 | Ht 64.0 in | Wt 164.0 lb

## 2016-12-03 DIAGNOSIS — I313 Pericardial effusion (noninflammatory): Secondary | ICD-10-CM

## 2016-12-03 DIAGNOSIS — Z5181 Encounter for therapeutic drug level monitoring: Secondary | ICD-10-CM | POA: Diagnosis not present

## 2016-12-03 DIAGNOSIS — I350 Nonrheumatic aortic (valve) stenosis: Secondary | ICD-10-CM | POA: Diagnosis not present

## 2016-12-03 DIAGNOSIS — I4891 Unspecified atrial fibrillation: Secondary | ICD-10-CM

## 2016-12-03 DIAGNOSIS — I10 Essential (primary) hypertension: Secondary | ICD-10-CM

## 2016-12-03 DIAGNOSIS — I3139 Other pericardial effusion (noninflammatory): Secondary | ICD-10-CM

## 2016-12-03 LAB — POCT INR: INR: 1

## 2016-12-03 MED ORDER — WARFARIN SODIUM 5 MG PO TABS: 5.0000 mg | ORAL_TABLET | Freq: Every day | ORAL | 3 refills | Status: DC

## 2016-12-03 MED ORDER — DILTIAZEM HCL ER COATED BEADS 120 MG PO CP24: ORAL_CAPSULE | ORAL | 3 refills | Status: DC

## 2016-12-03 NOTE — Patient Instructions (Addendum)
Your physician recommends that you schedule a follow-up appointment in:  2 months with Dr Purvis Sheffield    Take Cardizem CD120 mg, 2 tablets (240 mg) in the am, and take 1 tablet (120 mg) at night         Thank you for choosing [facility] !

## 2016-12-03 NOTE — Progress Notes (Signed)
SUBJECTIVE: The patient presents for follow-up of rapid atrial fibrillation. She has a history of end-stage renal disease on dialysis and hypertension. She is on anticoagulant with warfarin.  She denies exertional dyspnea. She complains of left leg pain.  Lower extremity Dopplers 11/06/16: Chronic nonocclusive DVT of the left popliteal and femoral veins.  Echocardiogram 11/09/16: Normal left ventricular systolic function, LVEF 55%, mild aortic stenosis, severe left atrial enlargement, small to moderate size pericardial effusion.   Review of Systems: As per "subjective", otherwise negative.  No Known Allergies  Current Outpatient Prescriptions  Medication Sig Dispense Refill  . Biotin 1 MG CAPS Take 1 mg by mouth daily.    . cinacalcet (SENSIPAR) 30 MG tablet Take 30 mg by mouth at bedtime.    Marland Kitchen diltiazem (CARDIZEM) 90 MG tablet Take 1 tablet (90 mg total) by mouth every 6 (six) hours. 90 tablet 0  . docusate sodium (COLACE) 100 MG capsule Take 100 mg by mouth 2 (two) times daily.    Marland Kitchen guaiFENesin (MUCINEX) 600 MG 12 hr tablet Take 1 tablet (600 mg total) by mouth 2 (two) times daily. 30 tablet 0  . ipratropium-albuterol (DUONEB) 0.5-2.5 (3) MG/3ML SOLN Take 3 mLs by nebulization every 6 (six) hours as needed. 360 mL 0  . lidocaine-prilocaine (EMLA) cream Apply 1 application topically as needed (for IV access).     . metoprolol tartrate (LOPRESSOR) 25 MG tablet Take 1 tablet (25 mg total) by mouth 2 (two) times daily. 60 tablet 0  . multivitamin (RENA-VIT) TABS tablet Take 1 tablet by mouth daily.    . pantoprazole (PROTONIX) 40 MG tablet Take 1 tablet by mouth daily.    . pentoxifylline (TRENTAL) 400 MG CR tablet Take 400 mg by mouth 3 (three) times daily with meals.    . promethazine (PHENERGAN) 25 MG tablet Take 25 mg by mouth every 6 (six) hours as needed for nausea or vomiting.    . senna (SENOKOT) 8.6 MG TABS tablet Take 1 tablet by mouth 2 (two) times daily.    . sevelamer  carbonate (RENVELA) 800 MG tablet Take 800-2,400 mg by mouth 3 (three) times daily with meals. Take  with snacks daily.     . simvastatin (ZOCOR) 10 MG tablet Take 10 mg by mouth at bedtime.     Marland Kitchen warfarin (COUMADIN) 1 MG tablet 1 mg. Give 0.5 mg along with 4 mg to equal 4.5 mg once an evening    . warfarin (COUMADIN) 4 MG tablet Give 4 mg along with 0.5 mg to equal 4.5 mg once an evening     No current facility-administered medications for this visit.     Past Medical History:  Diagnosis Date  . Anemia 02/11/2012  . Anxiety   . Atrial fibrillation (HCC)   . CHF (congestive heart failure) (HCC)   . Chronic kidney disease    not on dialysis yet, Tues, thurs, sat  . GERD (gastroesophageal reflux disease)   . H/O metabolic acidosis   . Hyperlipidemia   . Hyperparathyroidism, secondary (HCC)   . Hypertension    Dr. Regino Schultze, [place], [place]  . Oxygen dependent   . Vitamin D deficiency     Past Surgical History:  Procedure Laterality Date  . ABDOMINAL HYSTERECTOMY    . AV FISTULA PLACEMENT  04/01/2012   Procedure: ARTERIOVENOUS (AV) FISTULA CREATION;  Surgeon: Sherren Kerns, MD;  Location: [facility] OR;  Service: Vascular;  Laterality: Left;  . CHOLECYSTECTOMY    .  COLON SURGERY     for a blockage  . EYE SURGERY     bilateral cataracts, /w IOL - 2013  . FISTULOGRAM N/A 11/05/2014   Procedure: FISTULOGRAM;  Surgeon: Larina Earthly, MD;  Location: [facility] CATH LAB;  Service: Cardiovascular;  Laterality: N/A;  . INSERTION OF DIALYSIS CATHETER  04/10/2012   Procedure: INSERTION OF DIALYSIS CATHETER;  Surgeon: Pryor Ochoa, MD;  Location: [facility] OR;  Service: Vascular;  Laterality: N/A;  Insertion Diatek Catheter Right Internal Jugular  . LIGATION OF COMPETING BRANCHES OF ARTERIOVENOUS FISTULA  11/07/2012   Procedure: LIGATION OF COMPETING BRANCHES OF ARTERIOVENOUS FISTULA;  Surgeon: Chuck Hint, MD;  Location: [facility] OR;  Service: Vascular;  Laterality: Left;  Brachio/Cephalic Fistula  .  SHUNTOGRAM N/A 11/03/2012   Procedure: Betsey Amen;  Surgeon: Chuck Hint, MD;  Location: [facility] CATH LAB;  Service: Cardiovascular;  Laterality: N/A;  . SHUNTOGRAM Left 03/09/2013   Procedure: Fistulogram;  Surgeon: Fransisco Hertz, MD;  Location: [facility] CATH LAB;  Service: Cardiovascular;  Laterality: Left;  . SHUNTOGRAM Left 11/23/2013   Procedure: FISTULOGRAM;  Surgeon: Fransisco Hertz, MD;  Location: [facility] CATH LAB;  Service: Cardiovascular;  Laterality: Left;    Social History   Social History  . Marital status: Married    Spouse name: N/A  . Number of children: N/A  . Years of education: N/A   Occupational History  . Not on file.   Social History Main Topics  . Smoking status: Never Smoker  . Smokeless tobacco: Never Used  . Alcohol use No  . Drug use: No  . Sexual activity: Not on file   Other Topics Concern  . Not on file   Social History Narrative  . No narrative on file     Vitals:   12/03/16 0848  BP: 106/70  Pulse: 89  SpO2: 98%  Weight: 164 lb (74.4 kg)  Height: 5\' 4"  (1.626 m)    PHYSICAL EXAM General: NAD HEENT: Normal. Neck: No JVD, no thyromegaly. Lungs: Clear to auscultation bilaterally with normal respiratory effort. CV: Nondisplaced PMI.  Regular rate and irregular rhythm, normal S1/S2, no S3, no murmur. No pretibial or periankle edema.   Abdomen: Soft, nontender, no distention.  Neurologic: Alert and oriented.  Psych: Normal affect. Skin: Normal. Musculoskeletal: No gross deformities.    ECG: Most recent ECG reviewed.      ASSESSMENT AND PLAN: 1. Atrial fibrillation: Currently taking diltiazem 90 mg every 6 hours and metoprolol 25 mg twice daily. Anticoagulant with warfarin. I will switch diltiazem to long-acting 240 mg every morning and 120 mg every evening.  2. Hypertension: Controlled. No changes.  3. Aortic stenosis: Mild.  4. Pericardial effusion: Small to medium size. Will monitor.  Dispo: fu 2 months.  Prentice Docker, M.D.,  F.A.C.C.

## 2016-12-04 DIAGNOSIS — I739 Peripheral vascular disease, unspecified: Secondary | ICD-10-CM | POA: Diagnosis not present

## 2016-12-04 DIAGNOSIS — Z992 Dependence on renal dialysis: Secondary | ICD-10-CM | POA: Diagnosis not present

## 2016-12-04 DIAGNOSIS — N186 End stage renal disease: Secondary | ICD-10-CM | POA: Diagnosis not present

## 2016-12-04 DIAGNOSIS — I4891 Unspecified atrial fibrillation: Secondary | ICD-10-CM | POA: Diagnosis not present

## 2016-12-06 DIAGNOSIS — N186 End stage renal disease: Secondary | ICD-10-CM | POA: Diagnosis not present

## 2016-12-06 DIAGNOSIS — Z992 Dependence on renal dialysis: Secondary | ICD-10-CM | POA: Diagnosis not present

## 2016-12-07 ENCOUNTER — Encounter: Payer: Medicare Other | Admitting: Cardiovascular Disease

## 2016-12-08 DIAGNOSIS — Z992 Dependence on renal dialysis: Secondary | ICD-10-CM | POA: Diagnosis not present

## 2016-12-08 DIAGNOSIS — N186 End stage renal disease: Secondary | ICD-10-CM | POA: Diagnosis not present

## 2016-12-10 ENCOUNTER — Ambulatory Visit (INDEPENDENT_AMBULATORY_CARE_PROVIDER_SITE_OTHER): Payer: Medicare Other | Admitting: *Deleted

## 2016-12-10 DIAGNOSIS — I4891 Unspecified atrial fibrillation: Secondary | ICD-10-CM

## 2016-12-10 DIAGNOSIS — Z5181 Encounter for therapeutic drug level monitoring: Secondary | ICD-10-CM

## 2016-12-10 LAB — POCT INR: INR: 1.1

## 2016-12-11 DIAGNOSIS — Z992 Dependence on renal dialysis: Secondary | ICD-10-CM | POA: Diagnosis not present

## 2016-12-11 DIAGNOSIS — N186 End stage renal disease: Secondary | ICD-10-CM | POA: Diagnosis not present

## 2016-12-13 DIAGNOSIS — N186 End stage renal disease: Secondary | ICD-10-CM | POA: Diagnosis not present

## 2016-12-13 DIAGNOSIS — Z992 Dependence on renal dialysis: Secondary | ICD-10-CM | POA: Diagnosis not present

## 2016-12-15 DIAGNOSIS — N186 End stage renal disease: Secondary | ICD-10-CM | POA: Diagnosis not present

## 2016-12-15 DIAGNOSIS — Z992 Dependence on renal dialysis: Secondary | ICD-10-CM | POA: Diagnosis not present

## 2016-12-17 ENCOUNTER — Ambulatory Visit (INDEPENDENT_AMBULATORY_CARE_PROVIDER_SITE_OTHER): Payer: Medicare Other | Admitting: *Deleted

## 2016-12-17 DIAGNOSIS — I4891 Unspecified atrial fibrillation: Secondary | ICD-10-CM

## 2016-12-17 DIAGNOSIS — Z5181 Encounter for therapeutic drug level monitoring: Secondary | ICD-10-CM | POA: Diagnosis not present

## 2016-12-17 LAB — POCT INR: INR: 1.7

## 2016-12-18 DIAGNOSIS — Z992 Dependence on renal dialysis: Secondary | ICD-10-CM | POA: Diagnosis not present

## 2016-12-18 DIAGNOSIS — N186 End stage renal disease: Secondary | ICD-10-CM | POA: Diagnosis not present

## 2016-12-20 DIAGNOSIS — N186 End stage renal disease: Secondary | ICD-10-CM | POA: Diagnosis not present

## 2016-12-20 DIAGNOSIS — Z992 Dependence on renal dialysis: Secondary | ICD-10-CM | POA: Diagnosis not present

## 2016-12-22 DIAGNOSIS — N186 End stage renal disease: Secondary | ICD-10-CM | POA: Diagnosis not present

## 2016-12-22 DIAGNOSIS — Z992 Dependence on renal dialysis: Secondary | ICD-10-CM | POA: Diagnosis not present

## 2016-12-24 ENCOUNTER — Ambulatory Visit (INDEPENDENT_AMBULATORY_CARE_PROVIDER_SITE_OTHER): Payer: Medicare Other | Admitting: *Deleted

## 2016-12-24 DIAGNOSIS — I4891 Unspecified atrial fibrillation: Secondary | ICD-10-CM

## 2016-12-24 DIAGNOSIS — Z5181 Encounter for therapeutic drug level monitoring: Secondary | ICD-10-CM | POA: Diagnosis not present

## 2016-12-24 LAB — POCT INR: INR: 2.5

## 2016-12-24 MED ORDER — WARFARIN SODIUM 5 MG PO TABS: ORAL_TABLET | ORAL | 3 refills | Status: DC

## 2016-12-25 DIAGNOSIS — N186 End stage renal disease: Secondary | ICD-10-CM | POA: Diagnosis not present

## 2016-12-25 DIAGNOSIS — Z992 Dependence on renal dialysis: Secondary | ICD-10-CM | POA: Diagnosis not present

## 2016-12-26 DIAGNOSIS — Z992 Dependence on renal dialysis: Secondary | ICD-10-CM | POA: Diagnosis not present

## 2016-12-26 DIAGNOSIS — J42 Unspecified chronic bronchitis: Secondary | ICD-10-CM | POA: Diagnosis not present

## 2016-12-26 DIAGNOSIS — N186 End stage renal disease: Secondary | ICD-10-CM | POA: Diagnosis not present

## 2016-12-27 DIAGNOSIS — Z992 Dependence on renal dialysis: Secondary | ICD-10-CM | POA: Diagnosis not present

## 2016-12-27 DIAGNOSIS — N186 End stage renal disease: Secondary | ICD-10-CM | POA: Diagnosis not present

## 2016-12-29 DIAGNOSIS — Z992 Dependence on renal dialysis: Secondary | ICD-10-CM | POA: Diagnosis not present

## 2016-12-29 DIAGNOSIS — N186 End stage renal disease: Secondary | ICD-10-CM | POA: Diagnosis not present

## 2017-01-01 DIAGNOSIS — Z992 Dependence on renal dialysis: Secondary | ICD-10-CM | POA: Diagnosis not present

## 2017-01-01 DIAGNOSIS — N186 End stage renal disease: Secondary | ICD-10-CM | POA: Diagnosis not present

## 2017-01-03 DIAGNOSIS — N186 End stage renal disease: Secondary | ICD-10-CM | POA: Diagnosis not present

## 2017-01-03 DIAGNOSIS — Z992 Dependence on renal dialysis: Secondary | ICD-10-CM | POA: Diagnosis not present

## 2017-01-05 DIAGNOSIS — Z992 Dependence on renal dialysis: Secondary | ICD-10-CM | POA: Diagnosis not present

## 2017-01-05 DIAGNOSIS — N186 End stage renal disease: Secondary | ICD-10-CM | POA: Diagnosis not present

## 2017-01-07 ENCOUNTER — Ambulatory Visit (INDEPENDENT_AMBULATORY_CARE_PROVIDER_SITE_OTHER): Payer: Medicare Other | Admitting: *Deleted

## 2017-01-07 DIAGNOSIS — Z5181 Encounter for therapeutic drug level monitoring: Secondary | ICD-10-CM | POA: Diagnosis not present

## 2017-01-07 DIAGNOSIS — I4891 Unspecified atrial fibrillation: Secondary | ICD-10-CM

## 2017-01-07 LAB — POCT INR: INR: 6.3

## 2017-01-08 DIAGNOSIS — N186 End stage renal disease: Secondary | ICD-10-CM | POA: Diagnosis not present

## 2017-01-08 DIAGNOSIS — Z992 Dependence on renal dialysis: Secondary | ICD-10-CM | POA: Diagnosis not present

## 2017-01-10 DIAGNOSIS — Z992 Dependence on renal dialysis: Secondary | ICD-10-CM | POA: Diagnosis not present

## 2017-01-10 DIAGNOSIS — N186 End stage renal disease: Secondary | ICD-10-CM | POA: Diagnosis not present

## 2017-01-12 DIAGNOSIS — Z992 Dependence on renal dialysis: Secondary | ICD-10-CM | POA: Diagnosis not present

## 2017-01-12 DIAGNOSIS — N186 End stage renal disease: Secondary | ICD-10-CM | POA: Diagnosis not present

## 2017-01-14 ENCOUNTER — Ambulatory Visit (INDEPENDENT_AMBULATORY_CARE_PROVIDER_SITE_OTHER): Payer: Medicare Other | Admitting: *Deleted

## 2017-01-14 DIAGNOSIS — I4891 Unspecified atrial fibrillation: Secondary | ICD-10-CM

## 2017-01-14 DIAGNOSIS — Z5181 Encounter for therapeutic drug level monitoring: Secondary | ICD-10-CM

## 2017-01-14 LAB — POCT INR: INR: 1.4

## 2017-01-15 DIAGNOSIS — Z992 Dependence on renal dialysis: Secondary | ICD-10-CM | POA: Diagnosis not present

## 2017-01-15 DIAGNOSIS — N186 End stage renal disease: Secondary | ICD-10-CM | POA: Diagnosis not present

## 2017-01-17 DIAGNOSIS — Z992 Dependence on renal dialysis: Secondary | ICD-10-CM | POA: Diagnosis not present

## 2017-01-17 DIAGNOSIS — N186 End stage renal disease: Secondary | ICD-10-CM | POA: Diagnosis not present

## 2017-01-19 DIAGNOSIS — Z992 Dependence on renal dialysis: Secondary | ICD-10-CM | POA: Diagnosis not present

## 2017-01-19 DIAGNOSIS — N186 End stage renal disease: Secondary | ICD-10-CM | POA: Diagnosis not present

## 2017-01-21 ENCOUNTER — Ambulatory Visit (INDEPENDENT_AMBULATORY_CARE_PROVIDER_SITE_OTHER): Payer: Medicare Other | Admitting: *Deleted

## 2017-01-21 DIAGNOSIS — I4891 Unspecified atrial fibrillation: Secondary | ICD-10-CM | POA: Diagnosis not present

## 2017-01-21 DIAGNOSIS — Z5181 Encounter for therapeutic drug level monitoring: Secondary | ICD-10-CM

## 2017-01-21 LAB — POCT INR: INR: 2.2

## 2017-01-22 DIAGNOSIS — N186 End stage renal disease: Secondary | ICD-10-CM | POA: Diagnosis not present

## 2017-01-22 DIAGNOSIS — Z992 Dependence on renal dialysis: Secondary | ICD-10-CM | POA: Diagnosis not present

## 2017-01-24 DIAGNOSIS — N186 End stage renal disease: Secondary | ICD-10-CM | POA: Diagnosis not present

## 2017-01-24 DIAGNOSIS — Z992 Dependence on renal dialysis: Secondary | ICD-10-CM | POA: Diagnosis not present

## 2017-01-26 DIAGNOSIS — N186 End stage renal disease: Secondary | ICD-10-CM | POA: Diagnosis not present

## 2017-01-26 DIAGNOSIS — Z992 Dependence on renal dialysis: Secondary | ICD-10-CM | POA: Diagnosis not present

## 2017-01-26 DIAGNOSIS — J42 Unspecified chronic bronchitis: Secondary | ICD-10-CM | POA: Diagnosis not present

## 2017-01-27 DIAGNOSIS — K922 Gastrointestinal hemorrhage, unspecified: Secondary | ICD-10-CM

## 2017-01-27 HISTORY — DX: Gastrointestinal hemorrhage, unspecified: K92.2

## 2017-01-29 DIAGNOSIS — N186 End stage renal disease: Secondary | ICD-10-CM | POA: Diagnosis not present

## 2017-01-29 DIAGNOSIS — Z992 Dependence on renal dialysis: Secondary | ICD-10-CM | POA: Diagnosis not present

## 2017-01-31 DIAGNOSIS — N186 End stage renal disease: Secondary | ICD-10-CM | POA: Diagnosis not present

## 2017-01-31 DIAGNOSIS — Z992 Dependence on renal dialysis: Secondary | ICD-10-CM | POA: Diagnosis not present

## 2017-02-01 ENCOUNTER — Ambulatory Visit: Payer: Medicare Other | Admitting: Cardiovascular Disease

## 2017-02-01 ENCOUNTER — Encounter: Payer: Self-pay | Admitting: Cardiovascular Disease

## 2017-02-02 DIAGNOSIS — N186 End stage renal disease: Secondary | ICD-10-CM | POA: Diagnosis not present

## 2017-02-02 DIAGNOSIS — Z992 Dependence on renal dialysis: Secondary | ICD-10-CM | POA: Diagnosis not present

## 2017-02-04 ENCOUNTER — Ambulatory Visit (INDEPENDENT_AMBULATORY_CARE_PROVIDER_SITE_OTHER): Payer: Medicare Other | Admitting: *Deleted

## 2017-02-04 DIAGNOSIS — Z5181 Encounter for therapeutic drug level monitoring: Secondary | ICD-10-CM | POA: Diagnosis not present

## 2017-02-04 DIAGNOSIS — I4891 Unspecified atrial fibrillation: Secondary | ICD-10-CM

## 2017-02-04 LAB — POCT INR: INR: 3.9

## 2017-02-05 DIAGNOSIS — N186 End stage renal disease: Secondary | ICD-10-CM | POA: Diagnosis not present

## 2017-02-05 DIAGNOSIS — Z992 Dependence on renal dialysis: Secondary | ICD-10-CM | POA: Diagnosis not present

## 2017-02-07 DIAGNOSIS — Z992 Dependence on renal dialysis: Secondary | ICD-10-CM | POA: Diagnosis not present

## 2017-02-07 DIAGNOSIS — N186 End stage renal disease: Secondary | ICD-10-CM | POA: Diagnosis not present

## 2017-02-09 DIAGNOSIS — N186 End stage renal disease: Secondary | ICD-10-CM | POA: Diagnosis not present

## 2017-02-09 DIAGNOSIS — Z992 Dependence on renal dialysis: Secondary | ICD-10-CM | POA: Diagnosis not present

## 2017-02-12 DIAGNOSIS — N186 End stage renal disease: Secondary | ICD-10-CM | POA: Diagnosis not present

## 2017-02-12 DIAGNOSIS — Z992 Dependence on renal dialysis: Secondary | ICD-10-CM | POA: Diagnosis not present

## 2017-02-14 DIAGNOSIS — N186 End stage renal disease: Secondary | ICD-10-CM | POA: Diagnosis not present

## 2017-02-14 DIAGNOSIS — Z992 Dependence on renal dialysis: Secondary | ICD-10-CM | POA: Diagnosis not present

## 2017-02-16 ENCOUNTER — Emergency Department (HOSPITAL_COMMUNITY): Payer: Medicare Other

## 2017-02-16 ENCOUNTER — Inpatient Hospital Stay (HOSPITAL_COMMUNITY): Payer: Medicare Other

## 2017-02-16 ENCOUNTER — Inpatient Hospital Stay (HOSPITAL_COMMUNITY)
Admission: EM | Admit: 2017-02-16 | Discharge: 2017-02-22 | DRG: 377 | Disposition: A | Discharge status: Skilled Nursing Facility | Payer: Medicare Other | Attending: Family Medicine | Admitting: Family Medicine

## 2017-02-16 ENCOUNTER — Encounter (HOSPITAL_COMMUNITY): Payer: Self-pay | Admitting: Emergency Medicine

## 2017-02-16 DIAGNOSIS — E785 Hyperlipidemia, unspecified: Secondary | ICD-10-CM | POA: Diagnosis present

## 2017-02-16 DIAGNOSIS — B9681 Helicobacter pylori [H. pylori] as the cause of diseases classified elsewhere: Secondary | ICD-10-CM | POA: Diagnosis present

## 2017-02-16 DIAGNOSIS — Z9981 Dependence on supplemental oxygen: Secondary | ICD-10-CM

## 2017-02-16 DIAGNOSIS — K921 Melena: Secondary | ICD-10-CM | POA: Diagnosis not present

## 2017-02-16 DIAGNOSIS — I10 Essential (primary) hypertension: Secondary | ICD-10-CM | POA: Diagnosis not present

## 2017-02-16 DIAGNOSIS — Z0189 Encounter for other specified special examinations: Secondary | ICD-10-CM

## 2017-02-16 DIAGNOSIS — I517 Cardiomegaly: Secondary | ICD-10-CM | POA: Diagnosis not present

## 2017-02-16 DIAGNOSIS — Z4682 Encounter for fitting and adjustment of non-vascular catheter: Secondary | ICD-10-CM | POA: Diagnosis not present

## 2017-02-16 DIAGNOSIS — D6832 Hemorrhagic disorder due to extrinsic circulating anticoagulants: Secondary | ICD-10-CM | POA: Diagnosis present

## 2017-02-16 DIAGNOSIS — Z7901 Long term (current) use of anticoagulants: Secondary | ICD-10-CM | POA: Diagnosis not present

## 2017-02-16 DIAGNOSIS — R571 Hypovolemic shock: Secondary | ICD-10-CM | POA: Diagnosis not present

## 2017-02-16 DIAGNOSIS — Z79899 Other long term (current) drug therapy: Secondary | ICD-10-CM

## 2017-02-16 DIAGNOSIS — G9349 Other encephalopathy: Secondary | ICD-10-CM | POA: Diagnosis not present

## 2017-02-16 DIAGNOSIS — I482 Chronic atrial fibrillation: Secondary | ICD-10-CM | POA: Diagnosis not present

## 2017-02-16 DIAGNOSIS — J449 Chronic obstructive pulmonary disease, unspecified: Secondary | ICD-10-CM | POA: Diagnosis present

## 2017-02-16 DIAGNOSIS — R51 Headache: Secondary | ICD-10-CM | POA: Diagnosis not present

## 2017-02-16 DIAGNOSIS — Z4659 Encounter for fitting and adjustment of other gastrointestinal appliance and device: Secondary | ICD-10-CM

## 2017-02-16 DIAGNOSIS — K922 Gastrointestinal hemorrhage, unspecified: Secondary | ICD-10-CM | POA: Diagnosis present

## 2017-02-16 DIAGNOSIS — K920 Hematemesis: Secondary | ICD-10-CM

## 2017-02-16 DIAGNOSIS — I248 Other forms of acute ischemic heart disease: Secondary | ICD-10-CM | POA: Diagnosis not present

## 2017-02-16 DIAGNOSIS — R5381 Other malaise: Secondary | ICD-10-CM | POA: Diagnosis not present

## 2017-02-16 DIAGNOSIS — R197 Diarrhea, unspecified: Secondary | ICD-10-CM | POA: Diagnosis not present

## 2017-02-16 DIAGNOSIS — J9690 Respiratory failure, unspecified, unspecified whether with hypoxia or hypercapnia: Secondary | ICD-10-CM | POA: Diagnosis not present

## 2017-02-16 DIAGNOSIS — N2581 Secondary hyperparathyroidism of renal origin: Secondary | ICD-10-CM | POA: Diagnosis not present

## 2017-02-16 DIAGNOSIS — I5032 Chronic diastolic (congestive) heart failure: Secondary | ICD-10-CM | POA: Diagnosis not present

## 2017-02-16 DIAGNOSIS — D62 Acute posthemorrhagic anemia: Secondary | ICD-10-CM

## 2017-02-16 DIAGNOSIS — I120 Hypertensive chronic kidney disease with stage 5 chronic kidney disease or end stage renal disease: Secondary | ICD-10-CM | POA: Diagnosis not present

## 2017-02-16 DIAGNOSIS — K219 Gastro-esophageal reflux disease without esophagitis: Secondary | ICD-10-CM | POA: Diagnosis present

## 2017-02-16 DIAGNOSIS — D696 Thrombocytopenia, unspecified: Secondary | ICD-10-CM | POA: Diagnosis not present

## 2017-02-16 DIAGNOSIS — K254 Chronic or unspecified gastric ulcer with hemorrhage: Principal | ICD-10-CM

## 2017-02-16 DIAGNOSIS — Z9181 History of falling: Secondary | ICD-10-CM | POA: Diagnosis not present

## 2017-02-16 DIAGNOSIS — T45515A Adverse effect of anticoagulants, initial encounter: Secondary | ICD-10-CM | POA: Diagnosis not present

## 2017-02-16 DIAGNOSIS — N186 End stage renal disease: Secondary | ICD-10-CM | POA: Diagnosis present

## 2017-02-16 DIAGNOSIS — D649 Anemia, unspecified: Secondary | ICD-10-CM | POA: Diagnosis present

## 2017-02-16 DIAGNOSIS — M898X9 Other specified disorders of bone, unspecified site: Secondary | ICD-10-CM | POA: Diagnosis present

## 2017-02-16 DIAGNOSIS — G44319 Acute post-traumatic headache, not intractable: Secondary | ICD-10-CM | POA: Diagnosis not present

## 2017-02-16 DIAGNOSIS — E876 Hypokalemia: Secondary | ICD-10-CM | POA: Diagnosis not present

## 2017-02-16 DIAGNOSIS — Z992 Dependence on renal dialysis: Secondary | ICD-10-CM | POA: Diagnosis not present

## 2017-02-16 DIAGNOSIS — R768 Other specified abnormal immunological findings in serum: Secondary | ICD-10-CM

## 2017-02-16 DIAGNOSIS — Z452 Encounter for adjustment and management of vascular access device: Secondary | ICD-10-CM | POA: Diagnosis not present

## 2017-02-16 DIAGNOSIS — I5033 Acute on chronic diastolic (congestive) heart failure: Secondary | ICD-10-CM | POA: Diagnosis not present

## 2017-02-16 DIAGNOSIS — R739 Hyperglycemia, unspecified: Secondary | ICD-10-CM | POA: Diagnosis present

## 2017-02-16 DIAGNOSIS — R262 Difficulty in walking, not elsewhere classified: Secondary | ICD-10-CM | POA: Diagnosis not present

## 2017-02-16 DIAGNOSIS — I499 Cardiac arrhythmia, unspecified: Secondary | ICD-10-CM | POA: Diagnosis not present

## 2017-02-16 DIAGNOSIS — D631 Anemia in chronic kidney disease: Secondary | ICD-10-CM | POA: Diagnosis not present

## 2017-02-16 DIAGNOSIS — R0602 Shortness of breath: Secondary | ICD-10-CM | POA: Diagnosis not present

## 2017-02-16 DIAGNOSIS — M6281 Muscle weakness (generalized): Secondary | ICD-10-CM | POA: Diagnosis not present

## 2017-02-16 DIAGNOSIS — I4891 Unspecified atrial fibrillation: Secondary | ICD-10-CM | POA: Diagnosis not present

## 2017-02-16 LAB — CBC WITH DIFFERENTIAL/PLATELET
BASOS ABS: 0 10*3/uL (ref 0.0–0.1)
BASOS PCT: 0 %
EOS ABS: 0 10*3/uL (ref 0.0–0.7)
EOS PCT: 0 %
HEMATOCRIT: 23.2 % — AB (ref 36.0–46.0)
Hemoglobin: 7.3 g/dL — ABNORMAL LOW (ref 12.0–15.0)
Lymphocytes Relative: 12 %
Lymphs Abs: 1.4 10*3/uL (ref 0.7–4.0)
MCH: 29.8 pg (ref 26.0–34.0)
MCHC: 31.5 g/dL (ref 30.0–36.0)
MCV: 94.7 fL (ref 78.0–100.0)
MONO ABS: 0.6 10*3/uL (ref 0.1–1.0)
Monocytes Relative: 6 %
NEUTROS ABS: 9.3 10*3/uL — AB (ref 1.7–7.7)
Neutrophils Relative %: 82 %
PLATELETS: 306 10*3/uL (ref 150–400)
RBC: 2.45 MIL/uL — ABNORMAL LOW (ref 3.87–5.11)
RDW: 15.3 % (ref 11.5–15.5)
WBC: 11.3 10*3/uL — ABNORMAL HIGH (ref 4.0–10.5)

## 2017-02-16 LAB — PREPARE RBC (CROSSMATCH)

## 2017-02-16 LAB — PROTIME-INR
INR: 4.16 — AB
INR: 1.85
PROTHROMBIN TIME: 41.4 s — AB (ref 11.4–15.2)
PROTHROMBIN TIME: 21.6 s — AB (ref 11.4–15.2)

## 2017-02-16 LAB — COMPREHENSIVE METABOLIC PANEL
ALBUMIN: 2.4 g/dL — AB (ref 3.5–5.0)
ALT: 13 U/L — ABNORMAL LOW (ref 14–54)
ANION GAP: 14 (ref 5–15)
AST: 13 U/L — AB (ref 15–41)
Alkaline Phosphatase: 37 U/L — ABNORMAL LOW (ref 38–126)
BUN: 120 mg/dL — AB (ref 6–20)
CHLORIDE: 102 mmol/L (ref 101–111)
CO2: 21 mmol/L — AB (ref 22–32)
Calcium: 8.4 mg/dL — ABNORMAL LOW (ref 8.9–10.3)
Creatinine, Ser: 6.7 mg/dL — ABNORMAL HIGH (ref 0.44–1.00)
GFR calc Af Amer: 6 mL/min — ABNORMAL LOW (ref >60)
GFR calc non Af Amer: 5 mL/min — ABNORMAL LOW (ref >60)
GLUCOSE: 129 mg/dL — AB (ref 65–99)
POTASSIUM: 4.4 mmol/L (ref 3.5–5.1)
SODIUM: 137 mmol/L (ref 135–145)
Total Bilirubin: 1.6 mg/dL — ABNORMAL HIGH (ref 0.3–1.2)
Total Protein: 5.2 g/dL — ABNORMAL LOW (ref 6.5–8.1)

## 2017-02-16 LAB — LIPASE, BLOOD: Lipase: 39 U/L (ref 11–51)

## 2017-02-16 LAB — GLUCOSE, CAPILLARY
GLUCOSE-CAPILLARY: 94 mg/dL (ref 65–99)
Glucose-Capillary: 87 mg/dL (ref 65–99)

## 2017-02-16 LAB — HEMOGLOBIN AND HEMATOCRIT, BLOOD
HEMATOCRIT: 21.1 % — AB (ref 36.0–46.0)
HEMATOCRIT: 22.7 % — AB (ref 36.0–46.0)
HEMOGLOBIN: 6.6 g/dL — AB (ref 12.0–15.0)
HEMOGLOBIN: 7.5 g/dL — AB (ref 12.0–15.0)

## 2017-02-16 LAB — I-STAT TROPONIN, ED: Troponin i, poc: 0.03 ng/mL (ref 0.00–0.08)

## 2017-02-16 LAB — ABO/RH: ABO/RH(D): A POS

## 2017-02-16 LAB — MRSA PCR SCREENING: MRSA BY PCR: NEGATIVE

## 2017-02-16 MED ORDER — ONDANSETRON HCL 4 MG/2ML IJ SOLN
INTRAMUSCULAR | Status: AC
  Administered 2017-02-17: 4 mg via INTRAVENOUS
  Filled 2017-02-16: qty 2

## 2017-02-16 MED ORDER — SODIUM CHLORIDE 0.9% FLUSH
3.0000 mL | Freq: Two times a day (BID) | INTRAVENOUS | Status: DC
  Administered 2017-02-18 – 2017-02-22 (×8): 3 mL via INTRAVENOUS

## 2017-02-16 MED ORDER — ACETAMINOPHEN 325 MG PO TABS
650.0000 mg | ORAL_TABLET | Freq: Once | ORAL | Status: AC
  Administered 2017-02-16: 650 mg via ORAL
  Filled 2017-02-16: qty 2

## 2017-02-16 MED ORDER — ONDANSETRON HCL 4 MG/2ML IJ SOLN
4.0000 mg | Freq: Once | INTRAMUSCULAR | Status: AC
  Administered 2017-02-16: 4 mg via INTRAVENOUS

## 2017-02-16 MED ORDER — SODIUM CHLORIDE 0.9 % IV SOLN
80.0000 mg | Freq: Once | INTRAVENOUS | Status: DC
  Filled 2017-02-16 (×2): qty 80

## 2017-02-16 MED ORDER — SODIUM CHLORIDE 0.9 % IV SOLN
8.0000 mg/h | INTRAVENOUS | Status: AC
  Administered 2017-02-16 – 2017-02-19 (×6): 8 mg/h via INTRAVENOUS
  Filled 2017-02-16 (×14): qty 80

## 2017-02-16 MED ORDER — SODIUM CHLORIDE 0.9 % IV SOLN: Freq: Once | INTRAVENOUS | Status: DC

## 2017-02-16 MED ORDER — DILTIAZEM HCL 100 MG IV SOLR
5.0000 mg/h | INTRAVENOUS | Status: DC
  Administered 2017-02-17 (×3): 10 mg/h via INTRAVENOUS
  Administered 2017-02-18: 15 mg/h via INTRAVENOUS
  Administered 2017-02-18: 5 mg/h via INTRAVENOUS
  Administered 2017-02-19 – 2017-02-20 (×3): 10 mg/h via INTRAVENOUS
  Filled 2017-02-16 (×10): qty 100

## 2017-02-16 MED ORDER — SODIUM CHLORIDE 0.9 % IV SOLN
Freq: Once | INTRAVENOUS | Status: AC
  Administered 2017-02-16: 50 mL via INTRAVENOUS

## 2017-02-16 MED ORDER — SODIUM CHLORIDE 0.9 % IV SOLN
INTRAVENOUS | Status: DC
  Administered 2017-02-16: 11:00:00 via INTRAVENOUS

## 2017-02-16 MED ORDER — PANTOPRAZOLE SODIUM 40 MG IV SOLR
80.0000 mg | Freq: Once | INTRAVENOUS | Status: AC
  Administered 2017-02-16: 80 mg via INTRAVENOUS
  Filled 2017-02-16: qty 80

## 2017-02-16 MED ORDER — ONDANSETRON HCL 4 MG PO TABS: 4.0000 mg | ORAL_TABLET | Freq: Four times a day (QID) | ORAL | Status: DC | PRN

## 2017-02-16 MED ORDER — VITAMIN K1 10 MG/ML IJ SOLN
10.0000 mg | Freq: Once | INTRAMUSCULAR | Status: AC
  Administered 2017-02-16: 10 mg via SUBCUTANEOUS
  Filled 2017-02-16: qty 1

## 2017-02-16 MED ORDER — METOPROLOL TARTRATE 25 MG PO TABS: 25.0000 mg | ORAL_TABLET | Freq: Once | ORAL | Status: DC

## 2017-02-16 MED ORDER — DILTIAZEM HCL ER COATED BEADS 240 MG PO CP24
240.0000 mg | ORAL_CAPSULE | Freq: Every day | ORAL | Status: DC
  Filled 2017-02-16 (×3): qty 1

## 2017-02-16 MED ORDER — ONDANSETRON HCL 4 MG/2ML IJ SOLN
4.0000 mg | Freq: Once | INTRAMUSCULAR | Status: AC
  Administered 2017-02-16: 4 mg via INTRAVENOUS
  Filled 2017-02-16: qty 2

## 2017-02-16 MED ORDER — METOCLOPRAMIDE HCL 5 MG/ML IJ SOLN
5.0000 mg | Freq: Once | INTRAMUSCULAR | Status: AC
  Administered 2017-02-17: 5 mg via INTRAVENOUS
  Filled 2017-02-16: qty 1

## 2017-02-16 MED ORDER — SODIUM CHLORIDE 0.9 % IV BOLUS (SEPSIS)
250.0000 mL | Freq: Once | INTRAVENOUS | Status: AC
  Administered 2017-02-16: 500 mL via INTRAVENOUS

## 2017-02-16 MED ORDER — PANTOPRAZOLE SODIUM 40 MG IV SOLR
40.0000 mg | Freq: Two times a day (BID) | INTRAVENOUS | Status: DC
  Administered 2017-02-20 (×2): 40 mg via INTRAVENOUS
  Filled 2017-02-16 (×2): qty 40

## 2017-02-16 MED ORDER — ONDANSETRON HCL 4 MG/2ML IJ SOLN
4.0000 mg | Freq: Four times a day (QID) | INTRAMUSCULAR | Status: DC | PRN
  Administered 2017-02-17 – 2017-02-20 (×3): 4 mg via INTRAVENOUS
  Filled 2017-02-16 (×3): qty 2

## 2017-02-16 MED ORDER — SODIUM CHLORIDE 0.9 % IV SOLN
15.0000 ug | Freq: Once | INTRAVENOUS | Status: AC
  Administered 2017-02-16: 15.2 ug via INTRAVENOUS
  Filled 2017-02-16: qty 3.8

## 2017-02-16 MED ORDER — SODIUM CHLORIDE 0.9 % IV SOLN
Freq: Once | INTRAVENOUS | Status: DC
Start: 2017-02-16 — End: 2017-02-19

## 2017-02-16 MED ORDER — DILTIAZEM LOAD VIA INFUSION
10.0000 mg | Freq: Once | INTRAVENOUS | Status: AC
  Administered 2017-02-16: 10 mg via INTRAVENOUS
  Filled 2017-02-16: qty 10

## 2017-02-16 MED ORDER — METOPROLOL TARTRATE 5 MG/5ML IV SOLN
5.0000 mg | Freq: Once | INTRAVENOUS | Status: AC
  Administered 2017-02-16: 5 mg via INTRAVENOUS
  Filled 2017-02-16: qty 5

## 2017-02-16 MED ORDER — FENTANYL CITRATE (PF) 100 MCG/2ML IJ SOLN
25.0000 ug | Freq: Once | INTRAMUSCULAR | Status: AC
  Administered 2017-02-16: 25 ug via INTRAVENOUS
  Filled 2017-02-16: qty 2

## 2017-02-16 NOTE — Consult Note (Signed)
[facility] Consult Note   History Patricia Ayala MRN # [id number]  Date of Admission: 02/16/2017 Date of Consultation: 02/16/2017 Referring physician: Dr. Filbert Schilder, MD  Reason for Consultation/Chief Complaint: Melena  Subjective  HPI:  This is an 83 year old woman who presented to [facility] early this morning with melena. She was seen at dialysis, and was noted to have had several days of passing black tarry stool. She was found to have a very low hemoglobin was referred to the ED. The patient then had hematemesis in the [facility] EGD in her blood pressure was low. As no critical care was available at that facility, she was transferred to [facility] cone. The patient is a very poor historian. She reports taking aspirin "sometimes" and Aleve twice a day for unspecified pain. She has been on Coumadin for atrial fibrillation over the last 6 or 7 weeks, and says it is been difficult to get control. Her INR was over 4 today. She currently denies chest pain or dyspnea. So far she has had 2 units of PRBCs with a third unit planned, and 2 units of FFP are currently being infused. NG tube was placed upon arrival, and so far is only draining a scant amount of red blood.  ROS:  She complains of generalized fatigue  All other systems are negative except as noted above in the HPI  Past Medical History Past Medical History:  Diagnosis Date  . Anemia 02/11/2012  . Anxiety   . Atrial fibrillation (HCC)   . CHF (congestive heart failure) (HCC)   . Chronic kidney disease    not on dialysis yet, Tues, thurs, sat  . GERD (gastroesophageal reflux disease)   . H/O metabolic acidosis   . Hyperlipidemia   . Hyperparathyroidism, secondary (HCC)   . Hypertension    Dr. Regino Schultze, [place] [facility], [place]  . Oxygen dependent   . Vitamin D deficiency     Past Surgical History Past Surgical History:  Procedure Laterality Date  . ABDOMINAL HYSTERECTOMY    . AV FISTULA PLACEMENT   04/01/2012   Procedure: ARTERIOVENOUS (AV) FISTULA CREATION;  Surgeon: Sherren Kerns, MD;  Location: [facility] OR;  Service: Vascular;  Laterality: Left;  . CHOLECYSTECTOMY    . COLON SURGERY     for a blockage  . EYE SURGERY     bilateral cataracts, /w IOL - 2013  . FISTULOGRAM N/A 11/05/2014   Procedure: FISTULOGRAM;  Surgeon: Larina Earthly, MD;  Location: [facility] CATH LAB;  Service: Cardiovascular;  Laterality: N/A;  . INSERTION OF DIALYSIS CATHETER  04/10/2012   Procedure: INSERTION OF DIALYSIS CATHETER;  Surgeon: Pryor Ochoa, MD;  Location: [facility] OR;  Service: Vascular;  Laterality: N/A;  Insertion Diatek Catheter Right Internal Jugular  . LIGATION OF COMPETING BRANCHES OF ARTERIOVENOUS FISTULA  11/07/2012   Procedure: LIGATION OF COMPETING BRANCHES OF ARTERIOVENOUS FISTULA;  Surgeon: Chuck Hint, MD;  Location: [facility] OR;  Service: Vascular;  Laterality: Left;  Brachio/Cephalic Fistula  . SHUNTOGRAM N/A 11/03/2012   Procedure: Betsey Amen;  Surgeon: Chuck Hint, MD;  Location: [facility] CATH LAB;  Service: Cardiovascular;  Laterality: N/A;  . SHUNTOGRAM Left 03/09/2013   Procedure: Fistulogram;  Surgeon: Fransisco Hertz, MD;  Location: [facility] CATH LAB;  Service: Cardiovascular;  Laterality: Left;  . SHUNTOGRAM Left 11/23/2013   Procedure: FISTULOGRAM;  Surgeon: Fransisco Hertz, MD;  Location: [facility] CATH LAB;  Service: Cardiovascular;  Laterality: Left;    Family History Family History  Problem  Relation Age of Onset  . Stroke Mother   . Cancer Father   . Heart attack Father     Social History Social History   Social History  . Marital status: Married    Spouse name: N/A  . Number of children: N/A  . Years of education: N/A   Social History Main Topics  . Smoking status: Never Smoker  . Smokeless tobacco: Never Used  . Alcohol use No  . Drug use: No  . Sexual activity: Not Asked   Other Topics Concern  . None   Social History Narrative  . None    Allergies No Known  Allergies  Outpatient Meds Home medications from the H+P and/or nursing med reconciliation reviewed.  Inpatient med list reviewed  _____________________________________________________________________ Objective   Exam:  Current vital signs  Patient Vitals for the past 8 hrs:  BP Temp Temp src Pulse Resp SpO2  02/16/17 1926 - 98.4 F (36.9 C) Oral - - -  02/16/17 1902 121/70 98.6 F (37 C) Oral (!) 129 (!) 23 100 %  02/16/17 1841 - 97.8 F (36.6 C) Oral - - 100 %  02/16/17 1805 - 98.4 F (36.9 C) Oral (!) 146 (!) 21 100 %  02/16/17 1800 117/71 - - (!) 140 (!) 23 100 %  02/16/17 1750 - 97.9 F (36.6 C) - (!) 110 20 100 %  02/16/17 1730 (!) 134/91 - - (!) 158 (!) 26 100 %  02/16/17 1700 (!) 126/93 - - (!) 133 (!) 23 100 %  02/16/17 1630 109/81 - - 68 (!) 29 99 %  02/16/17 1627 (!) 126/94 98.8 F (37.1 C) Oral (!) 150 (!) 29 100 %  02/16/17 1600 (!) 140/130 - - (!) 136 19 100 %  02/16/17 1545 116/80 98.6 F (37 C) Oral (!) 164 20 100 %  02/16/17 1500 105/82 - - 78 18 100 %  02/16/17 1430 102/85 - - 99 (!) 21 97 %  02/16/17 1405 (!) 130/111 97.7 F (36.5 C) Oral 95 20 100 %  02/16/17 1233 (!) 94/51 - - (!) 144 (!) 26 100 %  02/16/17 1216 (!) 88/43 - - - (!) 23 -  02/16/17 1215 - - - - - 100 %  02/16/17 1206 (!) 97/57 - - 60 (!) 26 100 %  02/16/17 1205 (!) 71/61 - - - 20 -  02/16/17 1204 (!) 79/49 - - - (!) 22 -    Intake/Output Summary (Last 24 hours) at 02/16/17 2003 Last data filed at 02/16/17 1926  Gross per 24 hour  Intake             1742 ml  Output              150 ml  Net             1592 ml    Physical Exam:    General: this is a Chronically ill-appearing elderly female patient in no acute distress  Eyes: sclera anicteric, no redness  ENT: oral mucosa moist without lesions, no cervical or supraclavicular lymphadenopathy, poor dentition  CV: Tachycardic and irregular, rate makes it difficult to appreciate any murmur, no JVD,, no peripheral  edema  Resp: clear to auscultation bilaterally, normal RR and effort noted  GI: soft, no tenderness, with active bowel sounds. No guarding or palpable organomegaly noted. She has several scars  Skin; warm and dry, no rash or jaundice noted  Neuro: awake, alert and oriented x 3. Normal gross motor  function and fluent speech. Palpable thrill over her left upper extremity fistula Labs:   Recent Labs Lab 02/16/17 0741 02/16/17 1035  WBC 11.3*  --   HGB 7.3* 6.6*  HCT 23.2* 21.1*  PLT 306  --     Recent Labs Lab 02/16/17 0741  NA 137  K 4.4  CL 102  CO2 21*  BUN 120*  ALBUMIN 2.4*  ALKPHOS 37*  ALT 13*  AST 13*  GLUCOSE 129*    Recent Labs Lab 02/16/17 0741  INR 4.16*    Radiologic studies: KUB confirms NG tube placement and stomach  @ASSESSMENTPLANBEGIN @ Impression:  Melena and hematemesis Acute blood loss anemia Rapid atrial fibrillation Chronic anticoagulation, over therapeutic and exacerbating the GI bleeding Chronic kidney disease requiring dialysis  Plan:  suspected peptic ulcer disease from NSAIDs, exacerbated by Coumadin.   The patient is receiving blood products to support her hemoglobin and blood pressure as well as reverse over therapeutic Coumadin. Having difficulty with IV access at this point, especially since her left arm has a fistula and therefore cannot have placement of a peripheral IV. After some plasma goes in, there are plans for placement of a central line by the critical care service.  She has received appropriate Protonix drip and is nothing by mouth  I discussed an upper endoscopy with her, and I am planning to perform it tomorrow morning as long she remains stable. I would like some time for the blood products and medicine to help with hemostasis before performing an endoscopy. The procedure was discussed in detail in the presence of her nurse, and she is agreeable.  The benefits and risks of the planned procedure were described  in detail with the patient or (when appropriate) their health care proxy.  Risks were outlined as including, but not limited to, bleeding, infection, perforation, adverse medication reaction leading to cardiac or pulmonary decompensation, or pancreatitis (if ERCP).  The limitation of incomplete mucosal visualization was also discussed.  No guarantees or warranties were given.  Patient at increased risk for cardiopulmonary complications of procedure due to medical comorbidities.  Further plan to follow per endoscopy   Thank you for the courtesy of this consult.  Please contact me with any questions or concerns.  Charlie Pitter III Pager: [phone] Mon-Fri 8a-5p [phone] after 5p, weekends, holidays

## 2017-02-16 NOTE — Consult Note (Signed)
Renal Service Consult Note [facility]  RAGUEL KOSLOSKI 02/16/2017 Maree Krabbe Requesting Physician:  Dr Rinaldo Ratel  Reason for Consult:  ESRD pt with GI bleeding HPI: The patient is a 83 y.o. year-old with history of black , tarry stools, onset yesterday after eating chicken at [employer]. She had diarrhea later at home and her stool was black and tarry. Continued through the night.   Also fell 1 wk ago and having HA's since then.  Last HD on Mar 17, 2023, 2 days ago.  Hx of AFib. Had been taking all week for HA's.  No abd pain, no vomiting, no F/C/S.  In ED Hb 7.3 and INR 4.  Pt admitted , given SQ vit K 10 mg and IVF'.  Vomited frank blood in ED then.  HR in 130 range, afib. Chest pressure in ED.  STarted on IV PPI and orders for FFP and prbc's were done.  DDAVP  15 ug given, NG tube placed.  Transferred to [facility].     Pt has NG in , denies prior bleeding like this.  Has had one dark stool since arrival here. No SOB.  Getting FFP now and has rec'd already 2u prbc's.    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  Married 1947/03/17 , 5 kids , 3 deceased, lives with her husband in [place].  He drives, they go out together.  She stopped driving a few mos ago after hospital stay.  No tob Lynden Ang.    Na 137  K 4.4 BUN 120  Cr 6.70  Glu 129  Alb 2.4   LFT's ok  WBC 11k Hb 7.3, repeat 6.6 at 11 am.    Old chart: Dec 17 - pulm edema, ESRD, treated with HD and IV ntg drip, EF 55% Jan 18 - SOB w pulm edema, Rx with HD x 2.  dc'd home Jan 18 - SOB/ cough, hypoxemia > new onset afib with RVR, upper resp infection, acute resp failure. prob vol overload + acute bronchitis. Rx doxy, O2, HD. HD TTS Jan 18 - with rapid heart rate, afib / RVR rx'd with dilt IV drip, BB.  Cont coumadin, dc'd on Lopressor/ po dilt.  HD TTS.    ROS  denies CP  no joint pain   no HA  no blurry vision  no rash  no diarrhea  no nausea/ vomiting  no dysuria  no difficulty  voiding  no change in urine color    Past Medical History  Past Medical History:  Diagnosis Date  . Anemia 02/11/2012  . Anxiety   . Atrial fibrillation (HCC)   . CHF (congestive heart failure) (HCC)   . Chronic kidney disease    not on dialysis yet, Tues, thurs, sat  . GERD (gastroesophageal reflux disease)   . H/O metabolic acidosis   . Hyperlipidemia   . Hyperparathyroidism, secondary (HCC)   . Hypertension    Dr. Regino Schultze, [facility], [place]  . Oxygen dependent   . Vitamin D deficiency    Past Surgical History  Past Surgical History:  Procedure Laterality Date  . ABDOMINAL HYSTERECTOMY    . AV FISTULA PLACEMENT  04/01/2012   Procedure: ARTERIOVENOUS (AV) FISTULA CREATION;  Surgeon: Sherren Kerns, MD;  Location: [facility] OR;  Service: Vascular;  Laterality: Left;  . CHOLECYSTECTOMY    . COLON SURGERY     for a blockage  . EYE SURGERY     bilateral cataracts, /w  IOL - 2013  . FISTULOGRAM N/A 11/05/2014   Procedure: FISTULOGRAM;  Surgeon: Larina Earthly, MD;  Location: [facility] CATH LAB;  Service: Cardiovascular;  Laterality: N/A;  . INSERTION OF DIALYSIS CATHETER  04/10/2012   Procedure: INSERTION OF DIALYSIS CATHETER;  Surgeon: Pryor Ochoa, MD;  Location: [facility] OR;  Service: Vascular;  Laterality: N/A;  Insertion Diatek Catheter Right Internal Jugular  . LIGATION OF COMPETING BRANCHES OF ARTERIOVENOUS FISTULA  11/07/2012   Procedure: LIGATION OF COMPETING BRANCHES OF ARTERIOVENOUS FISTULA;  Surgeon: Chuck Hint, MD;  Location: [facility] OR;  Service: Vascular;  Laterality: Left;  Brachio/Cephalic Fistula  . SHUNTOGRAM N/A 11/03/2012   Procedure: Betsey Amen;  Surgeon: Chuck Hint, MD;  Location: [facility] CATH LAB;  Service: Cardiovascular;  Laterality: N/A;  . SHUNTOGRAM Left 03/09/2013   Procedure: Fistulogram;  Surgeon: Fransisco Hertz, MD;  Location: [facility] CATH LAB;  Service: Cardiovascular;  Laterality: Left;  . SHUNTOGRAM Left 11/23/2013   Procedure: FISTULOGRAM;  Surgeon: Fransisco Hertz, MD;   Location: [facility] CATH LAB;  Service: Cardiovascular;  Laterality: Left;   Family History  Family History  Problem Relation Age of Onset  . Stroke Mother   . Cancer Father   . Heart attack Father    Social History  reports that she has never smoked. She has never used smokeless tobacco. She reports that she does not drink alcohol or use drugs. Allergies No Known Allergies Home medications Prior to Admission medications   Medication Sig Start Date End Date Taking? Authorizing Provider  ALPRAZolam Prudy Feeler) 0.5 MG tablet Take 0.5 mg by mouth 3 (three) times daily as needed for anxiety.   Yes Historical Provider, MD  Biotin 1 MG CAPS Take 1 mg by mouth daily.   Yes Historical Provider, MD  diltiazem (CARDIZEM CD) 120 MG 24 hr capsule Take 2 tablets in the am ( 240 mg)  And take 1 tablet (120 mg) at night 12/03/16  Yes Laqueta Linden, MD  ipratropium-albuterol (DUONEB) 0.5-2.5 (3) MG/3ML SOLN Take 3 mLs by nebulization every 6 (six) hours as needed. 11/06/16  Yes Kathlen Mody, MD  lidocaine-prilocaine (EMLA) cream Apply 1 application topically as needed (for IV access).  01/12/13  Yes Historical Provider, MD  metoprolol tartrate (LOPRESSOR) 25 MG tablet Take 1 tablet (25 mg total) by mouth 2 (two) times daily. 11/10/16  Yes Leroy Sea, MD  multivitamin (RENA-VIT) TABS tablet Take 1 tablet by mouth daily.   Yes Historical Provider, MD  pentoxifylline (TRENTAL) 400 MG CR tablet Take 400 mg by mouth 3 (three) times daily with meals.   Yes Historical Provider, MD  promethazine (PHENERGAN) 25 MG tablet Take 25 mg by mouth every 6 (six) hours as needed for nausea or vomiting.   Yes Historical Provider, MD  sevelamer carbonate (RENVELA) 800 MG tablet Take 1,600 mg by mouth 3 (three) times daily with meals. Take 800mg  with snacks daily.    Yes Historical Provider, MD  simvastatin (ZOCOR) 10 MG tablet Take 10 mg by mouth daily.   Yes Historical Provider, MD  warfarin (COUMADIN) 5 MG tablet Take 1 1/2  tablets daily except 1 tablet on Tuesdays, Thursdays and Saturdays 12/24/16  Yes Laqueta Linden, MD   Liver Function Tests  Recent Labs Lab 02/16/17 0741  AST 13*  ALT 13*  ALKPHOS 37*  BILITOT 1.6*  PROT 5.2*  ALBUMIN 2.4*    Recent Labs Lab 02/16/17 0741  LIPASE 39   CBC  Recent Labs Lab  02/16/17 0741 02/16/17 1035  WBC 11.3*  --   NEUTROABS 9.3*  --   HGB 7.3* 6.6*  HCT 23.2* 21.1*  MCV 94.7  --   PLT 306  --    Basic Metabolic Panel  Recent Labs Lab 02/16/17 0741  NA 137  K 4.4  CL 102  CO2 21*  GLUCOSE 129*  BUN 120*  CREATININE 6.70*  CALCIUM 8.4*   Iron/TIBC/Ferritin/ %Sat No results found for: IRON, TIBC, FERRITIN, IRONPCTSAT  Vitals:   02/16/17 1700 02/16/17 1730 02/16/17 1750 02/16/17 1800  BP: (!) 126/93 (!) 134/91  117/71  Pulse: (!) 133 (!) 158 (!) 110 (!) 140  Resp: (!) 23 (!) 26 20 (!) 23  Temp:   97.9 F (36.6 C)   TempSrc:      SpO2: 100% 100% 100% 100%  Weight:      Height:       Exam Gen pale elderly AAF, NG draining small amts blood No rash, cyanosis or gangrene Sclera anicteric, throat clear  No jvd or bruits Chest clear bilat to bases Cor irreg irreg no RG Abd soft ntnd no mass or ascites +bs obese GU defer MS no joint effusions or deformity Ext trace LE edema / no wounds or ulcers Neuro is alert, Ox 3 , nf LUA AVF +bruit, no signs of infection   CXR - no edema or infiltrates   Dialysis: TTS Dr Fausto Skillern, unknown dry wt    Assessment: 1. GI bleed - looks to be upper bleed.  NG in, getting FFP for coagulopathy. Got DDAVP x 1.  2. ESRD on HD TTS, missed today 3. Anemia of ABL/ CKD - getting transfusions, get records, consider esa 4. HTN  5. O2 dependent 6. Chronic afib - w/ RVR today/ BP's soft 7. COPD    Plan - tolerating blood products so far, no signs of vol overload.  No indication for HD right now and she is slightly unstable.  Concerned about vol load with blood products, watch closely for  signs of vol overload.  Will consider HD tomorrow, early if necessary.  Will follow .    Vinson Moselle MD [employer] pager [phone]   02/16/2017, 6:15 PM

## 2017-02-16 NOTE — Consult Note (Signed)
Reason for Consult: End-stage renal disease Referring Physician: [name] is an 83 y.o. female.  HPI: She's the patient was history of hypertension, atrial fibrillation, end-stage renal disease on maintenance hemodialysis presently came because of dark stool when she is moving her bowels. Patient denies any nausea or vomiting. Patient also denies any difficulty breathing. When she was evaluated in emergency room shows found to have GI bleeding with low hemoglobin & admitted to the hospital for blood transfusion and further workup. Patient presently offers no complaints.  Past Medical History:  Diagnosis Date  . Anemia 02/11/2012  . Anxiety   . Atrial fibrillation ([place])   . CHF (congestive heart failure) ([place])   . Chronic kidney disease    not on dialysis yet, Tues, thurs, sat  . GERD (gastroesophageal reflux disease)   . H/O metabolic acidosis   . Hyperlipidemia   . Hyperparathyroidism, secondary ([place])   . Hypertension    Dr. Orson Ape, Patricia Ayala, Patricia Ayala  . Oxygen dependent   . Vitamin D deficiency     Past Surgical History:  Procedure Laterality Date  . ABDOMINAL HYSTERECTOMY    . AV FISTULA PLACEMENT  04/01/2012   Procedure: ARTERIOVENOUS (AV) FISTULA CREATION;  Surgeon: Elam Dutch, MD;  Location: [facility];  Service: Vascular;  Laterality: Left;  . CHOLECYSTECTOMY    . COLON SURGERY     for a blockage  . EYE SURGERY     bilateral cataracts, /w IOL - 2013  . FISTULOGRAM N/A 11/05/2014   Procedure: FISTULOGRAM;  Surgeon: Rosetta Posner, MD;  Location: [facility] CATH LAB;  Service: Cardiovascular;  Laterality: N/A;  . INSERTION OF DIALYSIS CATHETER  04/10/2012   Procedure: INSERTION OF DIALYSIS CATHETER;  Surgeon: Mal Misty, MD;  Location: [place];  Service: Vascular;  Laterality: N/A;  Insertion Diatek Catheter Right Internal Jugular  . LIGATION OF COMPETING BRANCHES OF ARTERIOVENOUS FISTULA  11/07/2012   Procedure: LIGATION OF COMPETING BRANCHES OF ARTERIOVENOUS FISTULA;   Surgeon: Angelia Mould, MD;  Location: [place];  Service: Vascular;  Laterality: Left;  Brachio/Cephalic Fistula  . SHUNTOGRAM N/A 11/03/2012   Procedure: Earney Mallet;  Surgeon: Angelia Mould, MD;  Location: [facility] CATH LAB;  Service: Cardiovascular;  Laterality: N/A;  . SHUNTOGRAM Left 03/09/2013   Procedure: Fistulogram;  Surgeon: Conrad , MD;  Location: [facility] CATH LAB;  Service: Cardiovascular;  Laterality: Left;  . SHUNTOGRAM Left 11/23/2013   Procedure: FISTULOGRAM;  Surgeon: Conrad , MD;  Location: [facility] CATH LAB;  Service: Cardiovascular;  Laterality: Left;    Family History  Problem Relation Age of Onset  . Stroke Mother   . Cancer Father   . Heart attack Father     Social History:  reports that she has never smoked. She has never used smokeless tobacco. She reports that she does not drink alcohol or use drugs.  Allergies: No Known Allergies  Medications: I have reviewed the patient's current medications.  Results for orders placed or performed during the hospital encounter of 02/16/17 (from the past 48 hour(s))  CBC with Differential/Platelet     Status: Abnormal   Collection Time: 02/16/17  7:41 AM  Result Value Ref Range   WBC 11.3 (H) 4.0 - 10.5 K/uL   RBC 2.45 (Patricia Ayala) 3.87 - 5.11 MIL/uL   Hemoglobin 7.3 (Patricia Ayala) 12.0 - 15.0 g/dL   HCT 23.2 (Patricia Ayala) 36.0 - 46.0 %   MCV 94.7 78.0 - 100.0 fL   MCH 29.8 26.0 -  34.0 pg   MCHC 31.5 30.0 - 36.0 g/dL   RDW 15.3 11.5 - 15.5 %   Platelets 306 150 - 400 K/uL   Neutrophils Relative % 82 %   Neutro Abs 9.3 (H) 1.7 - 7.7 K/uL   Lymphocytes Relative 12 %   Lymphs Abs 1.4 0.7 - 4.0 K/uL   Monocytes Relative 6 %   Monocytes Absolute 0.6 0.1 - 1.0 K/uL   Eosinophils Relative 0 %   Eosinophils Absolute 0.0 0.0 - 0.7 K/uL   Basophils Relative 0 %   Basophils Absolute 0.0 0.0 - 0.1 K/uL  Protime-INR     Status: Abnormal   Collection Time: 02/16/17  7:41 AM  Result Value Ref Range   Prothrombin Time 41.4 (H) 11.4 - 15.2 seconds    INR 4.16 (HH)     Comment: REPEATED TO VERIFY CRITICAL RESULT CALLED TO, READ BACK BY AND VERIFIED WITH: Patricia Ayala @ 0842 ON 4.21.18 BY Patricia Ayala,Patricia Ayala   Type and screen     Status: None (Preliminary result)   Collection Time: 02/16/17  7:41 AM  Result Value Ref Range   ABO/RH(D) A POS    Antibody Screen PENDING    Sample Expiration 02/19/2017   Comprehensive metabolic panel     Status: Abnormal   Collection Time: 02/16/17  7:41 AM  Result Value Ref Range   Sodium 137 135 - 145 mmol/Patricia Ayala   Potassium 4.4 3.5 - 5.1 mmol/Patricia Ayala   Chloride 102 101 - 111 mmol/Patricia Ayala   CO2 21 (Patricia Ayala) 22 - 32 mmol/Patricia Ayala   Glucose, Bld 129 (H) 65 - 99 mg/dL   BUN 120 (H) 6 - 20 mg/dL    Comment: RESULTS CONFIRMED BY MANUAL DILUTION   Creatinine, Ser 6.70 (H) 0.44 - 1.00 mg/dL   Calcium 8.4 (Patricia Ayala) 8.9 - 10.3 mg/dL   Total Protein 5.2 (Patricia Ayala) 6.5 - 8.1 g/dL   Albumin 2.4 (Patricia Ayala) 3.5 - 5.0 g/dL   AST 13 (Patricia Ayala) 15 - 41 U/Patricia Ayala   ALT 13 (Patricia Ayala) 14 - 54 U/Patricia Ayala   Alkaline Phosphatase 37 (Patricia Ayala) 38 - 126 U/Patricia Ayala   Total Bilirubin 1.6 (H) 0.3 - 1.2 mg/dL   GFR calc non Af Amer 5 (Patricia Ayala) >60 mL/min   GFR calc Af Amer 6 (Patricia Ayala) >60 mL/min    Comment: (NOTE) The eGFR has been calculated using the CKD EPI equation. This calculation has not been validated in all clinical situations. eGFR's persistently <60 mL/min signify possible Chronic Kidney Disease.    Anion gap 14 5 - 15  Lipase, blood     Status: None   Collection Time: 02/16/17  7:41 AM  Result Value Ref Range   Lipase 39 11 - 51 U/Patricia Ayala  I-stat troponin, ED     Status: None   Collection Time: 02/16/17  8:04 AM  Result Value Ref Range   Troponin i, poc 0.03 0.00 - 0.08 ng/mL   Comment 3            Comment: Due to the release kinetics of cTnI, a negative result within the first hours of the onset of symptoms does not rule out myocardial infarction with certainty. If myocardial infarction is still suspected, repeat the test at appropriate intervals.     Ct Head Wo Contrast  Result Date: 02/16/2017 CLINICAL DATA:   Headache since a fall 2 weeks ago. The patient is on Coumadin. EXAM: CT HEAD WITHOUT CONTRAST TECHNIQUE: Contiguous axial images were obtained from the base of the skull through the vertex without  intravenous contrast. COMPARISON:  None. FINDINGS: Brain: There is some cortical atrophy and chronic microvascular ischemic change. No evidence of acute abnormality including hemorrhage, infarct, mass lesion, mass effect, midline shift or abnormal extra-axial fluid collections identified. No hydrocephalus or pneumocephalus. Vascular: Atherosclerosis noted. Skull: Intact. Sinuses/Orbits: No acute abnormality. Other: None. IMPRESSION: No acute abnormality. Mild atrophy and chronic microvascular ischemic change. Electronically Signed   By: Patricia Rise M.D.   On: 02/16/2017 09:07    Review of Systems  Constitutional: Negative for chills and fever.  Respiratory: Negative for shortness of breath.   Cardiovascular: Negative for orthopnea and PND.  Gastrointestinal: Positive for blood in stool. Negative for abdominal pain, nausea and vomiting.  Neurological: Positive for weakness.   Blood pressure (!) 92/51, pulse (!) 128, temperature 98.6 F (37 C), temperature source Oral, resp. rate 20, height '5\' 4"'  (1.626 m), weight 79.4 kg (175 lb), SpO2 99 %. Physical Exam  Constitutional: No distress.  Eyes: No scleral icterus.  Neck: No JVD present.  Cardiovascular: Normal rate and regular rhythm.   Respiratory: No respiratory distress. She has no wheezes. She has no rales.  GI: She exhibits no distension. There is no tenderness.  Musculoskeletal: She exhibits no edema.    Assessment/Plan: Problem #1 GI bleeding: Most likely from Coumadin. Presently her hemoglobin is low. Problem #2 end-stage renal disease: She is status post hemodialysis on Thursday. Her potassium is normal. Today's her regular scheduled for dialysis. Problem #3 hypertension: Her blood pressure is somewhat low this morning. She denies any  dizziness or lightheadedness. Problem #4 a trial fibrillation: Her heart rate is slightly high. Overall controlled and asymptomatic. Problem #5 fluid management: No significant sign of fluid overload Problem #6 metabolic bone disease: Calcium is range. Problem #7 history of GERD Plan: 1] Patient has this moment doesn't require dialysis. 2] We'll make arrangement for dialysis tomorrow hopefully her blood pressure also will improve. 3] I agree with blood transfusion 4] we'll check her renal panel and CBC in the morning. 1]  Patricia Ayala S 02/16/2017, 10:23 AM

## 2017-02-16 NOTE — ED Notes (Addendum)
Delay in starting FFP due to lab computer downtime. FFP x1 started at 1233. Unit number [id number] Product number [id number] Total Volume . Verified right patient, blood arm band number, MRN, DOB, right dose, right time, right medication with [facility] and ED staff. Upon scanning unit, expiration date on label of 02-17-17; expiration label in [facility] 02/21/17. Lab staff aware.  Transfuse x2 units clicked in error when releasing initial bag of FFP. Only one unit was released at [facility] ED.  Initial rate of FFP 789ml/hr, volume infused 37ml. Pt tolerated well. Rate adjusted to 967ml/hr, Volume 330. nad noted.  Blood audit form started at time of RBC administration. Audit form was left in ER room at pt departure.

## 2017-02-16 NOTE — ED Notes (Addendum)
Date and time results received: 02/16/17 1121   Test: Hemoglobin  Critical Value: 6.6  Name of Provider Notified: Dr. Jacqulyn Bath  Orders Received? Or Actions Taken?: No new orders given. Blood products ordered prior to result.

## 2017-02-16 NOTE — ED Notes (Signed)
Pt stated that she had been taking aleve all week for her headache.

## 2017-02-16 NOTE — ED Notes (Addendum)
Pt transferred at this time. Primary RN calling report to [facility] at this time.  Upon initial insertion, placement assessed by auscultation prior to Chest Xray. Per radiology report, NG tube needs to be advanced approximately 5-10cm. [facility] aware and reported would communicate to [facility] truck transporting pt to [facility].

## 2017-02-16 NOTE — Progress Notes (Signed)
Contacted Dr. Adrian Blackwater about pt.  Explained that I needed orders to give blood products.  Informed the doctor of patients Afib at rates of 140, and reading as high as 160.  Also of the need for repeat abd xray after I inserted NG tube further into pt nose, as suggested by previous abd xray.

## 2017-02-16 NOTE — ED Triage Notes (Signed)
Per EMS pt states that she has been having black tarry stools that started on yesterday after eating chicken from [employer].  Pt fell about a week ago and has been having intermittent headaches since then.  Pt is a dialysis pt and was dialyzed on Thursday.  Pt has a history of AFIB and heart rate has been 120s to 145s. Pt denies losing consciousness and is not sure if she hit her head or not.     145 HR RR 18

## 2017-02-16 NOTE — ED Notes (Signed)
Call to pharm re protonix

## 2017-02-16 NOTE — Progress Notes (Signed)
Patient ID: Patricia Ayala, female   DOB: 04-Mar-1929, 83 y.o.   MRN: [id number]  Patient re-assessed. On second unit of blood, blood pressure improving. Heartrate still around 130-140 - a fib with RVR. Will start cardizem. Discussed pt with CCM - will hold protonix while running blood (cardizem and protonix not compatible), start cardizem drip. When blood products finished, will restart protonix. Will get H&H after this unit of blood.  Levie Heritage, DO 02/16/2017 5:44 PM

## 2017-02-16 NOTE — Progress Notes (Signed)
Upon patient arrival.  1 unit of FFP was hanging and already finished.  Nowhere in chart to sign off and complete the unit hanging.  Another unit was released, but did not come with the patient.

## 2017-02-16 NOTE — ED Notes (Signed)
Report given to [facility] ICU RN

## 2017-02-16 NOTE — ED Notes (Addendum)
Hemoccult resulted positive.

## 2017-02-16 NOTE — ED Notes (Signed)
Report given to [facility]. [facility] en route.

## 2017-02-16 NOTE — ED Notes (Signed)
Pt reporting mild chest pain. nad noted. EDP aware and reported to continyue infusion and monitor patient. No medication orders given at this time.

## 2017-02-16 NOTE — ED Notes (Signed)
Date and time results received: 02/16/17 842   Test: INR Critical Value: 4.16  Name of Provider Notified: Dr. Jacqulyn Bath  Orders Received? Or Actions Taken?: No new orders given at this time.

## 2017-02-16 NOTE — H&P (Signed)
History and Physical    Patricia Ayala UJW:[id number] DOB: [birth year] DOA: 02/16/2017  PCP: [name], MD  Patient coming from: Home  Chief Complaint: Dark stools  HPI: Patricia Ayala is a 83 y.o. female with medical history significant of atrial fibrillation (just started on coumadin), CHF, ESRD (on T/Th/Sat hemodialysis), HTN, HLD who presents with large volume black stools.  Per patient she and her husband both ate fried chicken wings from [employer] and she then began having dark stools.  Patient states last night she went out for dinner and ate the same thing her husband did.  She took a nap and when she woke up she started having diarrhea.  Her stool was black and tarry.  Patient reports all night she was going to the bathroom having black tarry stools.  Her husband brought her into the ED for evaluation of black tarry stools.  No abdominal pain, no vomiting, occasional burning with urination.  No fevers or chills.  Has been on coumadin for 6 weeks and states they have never gotten it right.  Reports that she is having lots of adverse effects from the coumadin. Patient has history of CHF last EF of 55% in January of 2018.  Has been getting frequent INR checks at [facility] in [place].   ED Course: Patient seen and evaluated- hemoglobin found to be at 7.3, creatinine of 6.7, INR of 4.16.  She was given 10mg  Subcutaneous vitamin K and IVF. Patient reports feeling of heartburn and burning in her throat.  At that time in the ED she began vomiting frank blood.  Heart rate is in the 120s-140s, atrial fibrillation, blood pressure in the 100-110s systolic. She states her chest still feels like it is burning. Protonix drip ordered, 2 units PRBC ordered, 4 units FFP ordered, NG tube ordered to be placed on low intermittent suction, of DDAVP ordered.  Discussed with GI provider at [facility] as well as Critical Care at [facility].  Patient will need transfer to [facility] for further management and  evaluation.  Currently patient is protecting her airway.  Discussed with Dr. Jacqulyn Bath, EDP that patient will be transferred by [facility] to [facility].  Hemoccult positive stool.  Review of Systems: As per HPI otherwise 10 point review of systems negative.    Past Medical History:  Diagnosis Date  . Anemia 02/11/2012  . Anxiety   . Atrial fibrillation (HCC)   . CHF (congestive heart failure) (HCC)   . Chronic kidney disease    not on dialysis yet, Tues, thurs, sat  . GERD (gastroesophageal reflux disease)   . H/O metabolic acidosis   . Hyperlipidemia   . Hyperparathyroidism, secondary (HCC)   . Hypertension    Dr. Regino Schultze, [place], [place]  . Oxygen dependent   . Vitamin D deficiency     Past Surgical History:  Procedure Laterality Date  . ABDOMINAL HYSTERECTOMY    . AV FISTULA PLACEMENT  04/01/2012   Procedure: ARTERIOVENOUS (AV) FISTULA CREATION;  Surgeon: Sherren Kerns, MD;  Location: [facility] OR;  Service: Vascular;  Laterality: Left;  . CHOLECYSTECTOMY    . COLON SURGERY     for a blockage  . EYE SURGERY     bilateral cataracts, /w IOL - 2013  . FISTULOGRAM N/A 11/05/2014   Procedure: FISTULOGRAM;  Surgeon: Larina Earthly, MD;  Location: [facility] CATH LAB;  Service: Cardiovascular;  Laterality: N/A;  . INSERTION OF DIALYSIS CATHETER  04/10/2012   Procedure: INSERTION OF  DIALYSIS CATHETER;  Surgeon: Pryor Ochoa, MD;  Location: [facility] OR;  Service: Vascular;  Laterality: N/A;  Insertion Diatek Catheter Right Internal Jugular  . LIGATION OF COMPETING BRANCHES OF ARTERIOVENOUS FISTULA  11/07/2012   Procedure: LIGATION OF COMPETING BRANCHES OF ARTERIOVENOUS FISTULA;  Surgeon: Chuck Hint, MD;  Location: [facility] OR;  Service: Vascular;  Laterality: Left;  Brachio/Cephalic Fistula  . SHUNTOGRAM N/A 11/03/2012   Procedure: Betsey Amen;  Surgeon: Chuck Hint, MD;  Location: [facility] CATH LAB;  Service: Cardiovascular;  Laterality: N/A;  . SHUNTOGRAM Left 03/09/2013   Procedure: Fistulogram;  Surgeon: Fransisco Hertz, MD;  Location: [facility] CATH LAB;  Service: Cardiovascular;  Laterality: Left;  . SHUNTOGRAM Left 11/23/2013   Procedure: FISTULOGRAM;  Surgeon: Fransisco Hertz, MD;  Location: [facility] CATH LAB;  Service: Cardiovascular;  Laterality: Left;     reports that she has never smoked. She has never used smokeless tobacco. She reports that she does not drink alcohol or use drugs.  No Known Allergies  Family History  Problem Relation Age of Onset  . Stroke Mother   . Cancer Father   . Heart attack Father      Prior to Admission medications   Medication Sig Start Date End Date Taking? Authorizing Provider  ALPRAZolam Prudy Feeler) 0.5 MG tablet Take 0.5 mg by mouth 3 (three) times daily as needed for anxiety.   Yes Historical Provider, MD  Biotin 1 MG CAPS Take 1 mg by mouth daily.   Yes Historical Provider, MD  diltiazem (CARDIZEM CD) 120 MG 24 hr capsule Take 2 tablets in the am ( 240 mg)  And take 1 tablet (120 mg) at night 12/03/16  Yes Laqueta Linden, MD  ipratropium-albuterol (DUONEB) 0.5-2.5 (3) MG/3ML SOLN Take 3 mLs by nebulization every 6 (six) hours as needed. 11/06/16  Yes Kathlen Mody, MD  lidocaine-prilocaine (EMLA) cream Apply 1 application topically as needed (for IV access).  01/12/13  Yes Historical Provider, MD  metoprolol tartrate (LOPRESSOR) 25 MG tablet Take 1 tablet (25 mg total) by mouth 2 (two) times daily. 11/10/16  Yes Leroy Sea, MD  multivitamin (RENA-VIT) TABS tablet Take 1 tablet by mouth daily.   Yes Historical Provider, MD  pentoxifylline (TRENTAL) 400 MG CR tablet Take 400 mg by mouth 3 (three) times daily with meals.   Yes Historical Provider, MD  promethazine (PHENERGAN) 25 MG tablet Take 25 mg by mouth every 6 (six) hours as needed for nausea or vomiting.   Yes Historical Provider, MD  sevelamer carbonate (RENVELA) 800 MG tablet Take 1,600 mg by mouth 3 (three) times daily with meals. Take 800mg  with snacks daily.    Yes Historical Provider, MD  simvastatin (ZOCOR) 10  MG tablet Take 10 mg by mouth daily.   Yes Historical Provider, MD  warfarin (COUMADIN) 5 MG tablet Take 1 1/2 tablets daily except 1 tablet on Tuesdays, Thursdays and Saturdays 12/24/16  Yes Laqueta Linden, MD    Physical Exam: Vitals:   02/16/17 1030 02/16/17 1100 02/16/17 1115 02/16/17 1125  BP: 107/70  (!) 147/120 91/62  Pulse: (!) 137 82  (!) 120  Resp: (!) 32 18 (!) 21 (!) 22  Temp:    97.7 F (36.5 C)  TempSrc:    Oral  SpO2: 98% 98%  100%  Weight:      Height:          Constitutional: moderate distress Vitals:   02/16/17 1030 02/16/17 1100 02/16/17 1115 02/16/17 1125  BP: 107/70  (!) 147/120 91/62  Pulse: (!) 137 82  (!) 120  Resp: (!) 32 18 (!) 21 (!) 22  Temp:    97.7 F (36.5 C)  TempSrc:    Oral  SpO2: 98% 98%  100%  Weight:      Height:       Eyes: PERRL, lids and conjunctivae normal, arcus senilis bilaterally ENMT: Mucous membranes are moist. Posterior pharynx clear of any exudate or lesions. Poor dentition.  Neck: normal, supple, no masses, no thyromegaly Respiratory: clear to auscultation bilaterally, no wheezing, no crackles. Normal respiratory effort. No accessory muscle use.  Cardiovascular: irregular rate and tachycardic. No extremity edema. Left upper arm fistula, 2+ pedal pulses. No carotid bruits.  Abdomen: tender in epigastrium, no masses palpated. No hepatosplenomegaly. Bowel sounds positive.  Musculoskeletal: no clubbing / cyanosis. No joint deformity upper and lower extremities. Good ROM, no contractures. Normal muscle tone.  Skin: no rashes, lesions, ulcers. No induration Neurologic: CN 2-12 grossly intact. Sensation intact, DTR normal. Strength 5/5 in all 4.  Psychiatric: Alert and oriented x 3. Normal mood.     Labs on Admission: I have personally reviewed following labs and imaging studies  CBC:  Recent Labs Lab 02/16/17 0741 02/16/17 1035  WBC 11.3*  --   NEUTROABS 9.3*  --   HGB 7.3* 6.6*  HCT 23.2* 21.1*  MCV 94.7  --     PLT 306  --    Basic Metabolic Panel:  Recent Labs Lab 02/16/17 0741  NA 137  K 4.4  CL 102  CO2 21*  GLUCOSE 129*  BUN 120*  CREATININE 6.70*  CALCIUM 8.4*   GFR: Estimated Creatinine Clearance: 6.1 mL/min (A) (by C-G formula based on SCr of 6.7 mg/dL (H)). Liver Function Tests:  Recent Labs Lab 02/16/17 0741  AST 13*  ALT 13*  ALKPHOS 37*  BILITOT 1.6*  PROT 5.2*  ALBUMIN 2.4*    Recent Labs Lab 02/16/17 0741  LIPASE 39   No results for input(s): AMMONIA in the last 168 hours. Coagulation Profile:  Recent Labs Lab 02/16/17 0741  INR 4.16*   Cardiac Enzymes: No results for input(s): CKTOTAL, CKMB, CKMBINDEX, TROPONINI in the last 168 hours. BNP (last 3 results) No results for input(s): PROBNP in the last 8760 hours. HbA1C: No results for input(s): HGBA1C in the last 72 hours. CBG: No results for input(s): GLUCAP in the last 168 hours. Lipid Profile: No results for input(s): CHOL, HDL, LDLCALC, TRIG, CHOLHDL, LDLDIRECT in the last 72 hours. Thyroid Function Tests: No results for input(s): TSH, T4TOTAL, FREET4, T3FREE, THYROIDAB in the last 72 hours. Anemia Panel: No results for input(s): VITAMINB12, FOLATE, FERRITIN, TIBC, IRON, RETICCTPCT in the last 72 hours. Urine analysis:    Component Value Date/Time   COLORURINE YELLOW 09/24/2015 1341   APPEARANCEUR CLEAR 09/24/2015 1341   LABSPEC 1.010 09/24/2015 1341   PHURINE 8.0 09/24/2015 1341   GLUCOSEU 100 (A) 09/24/2015 1341   HGBUR MODERATE (A) 09/24/2015 1341   BILIRUBINUR NEGATIVE 09/24/2015 1341   KETONESUR NEGATIVE 09/24/2015 1341   PROTEINUR 100 (A) 09/24/2015 1341   NITRITE NEGATIVE 09/24/2015 1341   LEUKOCYTESUR NEGATIVE 09/24/2015 1341   Sepsis Labs: !!!!!!!!!!!!!!!!!!!!!!!!!!!!!!!!!!!!!!!!!!!! @LABRCNTIP (procalcitonin:4,lacticidven:4) )No results found for this or any previous visit (from the past 240 hour(s)).   Radiological Exams on Admission: Ct Head Wo Contrast  Result Date:  02/16/2017 CLINICAL DATA:  Headache since a fall 2 weeks ago. The patient is on Coumadin. EXAM: CT HEAD WITHOUT CONTRAST  TECHNIQUE: Contiguous axial images were obtained from the base of the skull through the vertex without intravenous contrast. COMPARISON:  None. FINDINGS: Brain: There is some cortical atrophy and chronic microvascular ischemic change. No evidence of acute abnormality including hemorrhage, infarct, mass lesion, mass effect, midline shift or abnormal extra-axial fluid collections identified. No hydrocephalus or pneumocephalus. Vascular: Atherosclerosis noted. Skull: Intact. Sinuses/Orbits: No acute abnormality. Other: None. IMPRESSION: No acute abnormality. Mild atrophy and chronic microvascular ischemic change. Electronically Signed   By: Drusilla Kanner M.D.   On: 02/16/2017 09:07    EKG: Independently reviewed. Atrial fibrillation with rapid ventricular rate  Assessment/Plan Principal Problem:   GIB (gastrointestinal bleeding) Active Problems:   Anemia   ESRD (end stage renal disease) (HCC)   Benign essential HTN   GERD (gastroesophageal reflux disease)   Acute on chronic diastolic CHF (congestive heart failure) (HCC)   Atrial fibrillation with RVR (HCC)     GIB/ Gastrointestinal hemorrhage - melena and hematemesis - needs transfer to [facility] to set up transfer and patient accepted to ICU - NGT on low intermittent suction - Dr. Myrtie Neither of GI notified and will need notification when patient arrives to [facility] - Vitamin K given - 2 units PRBC, 4 units FFP, Protonix drip ordered - NPO  Anemia 2/2 acute blood loss - 2 units PRBC - 2 large bore IV's placed - needs GI evaluation  ESRD - prior to transfer Dr. Fausto Skillern at [facility] saw and evaluated patient - K of 4.1 today - attempted to page Dr. Arlean Hopping of [facility] at [facility] to let him know patient is transferring  Atrial fibrillation with RVR - hold all anticoagulants - given of Vitamin K - not rate controlled      DVT prophylaxis: none   Code Status: Full Code   Family Communication: Husband bedside  Disposition Plan: Unclear- pending resolution of GIB  Consults called: Nephrology, Gastroenterology Admission status: Inpatient, SDU   Katrinka Blazing MD [facility] Pager [phone]  If 7PM-7AM, please contact night-coverage [email or web address] Password [facility]  02/16/2017, 11:47 AM

## 2017-02-16 NOTE — ED Notes (Signed)
#   16 NG tube passed to R nares Call to Radiology for placement confirmation

## 2017-02-16 NOTE — ED Provider Notes (Signed)
Emergency Department Provider Note   I have reviewed the triage vital signs and the nursing notes.   HISTORY  Chief Complaint Melena (headache)   HPI Patricia Ayala is a 83 y.o. female with PMH of a-fib, CHF, GERD, HLD, and HTN presents to the emergency department for evaluation of sudden onset black diarrhea that started yesterday. The patient reports his symptoms began after eating chicken wings. Her husband ate the same wings and did not become ill. She had some nausea but denies any vomiting. She notes large volume black diarrhea over the last 12 hours that seems to be some improving slightly. No fevers or chills. No bright red blood. No history of liver disease. No history of peptic ulcer disease.   The patient is also concerned about a persistent headache that she's had since a fall 2 weeks ago. She has been on Coumadin over that time after recent diagnosis of A. fib. Denies that her symptoms are worsening but her headache has been persistent. No radiation of pain.   Past Medical History:  Diagnosis Date  . Anemia 02/11/2012  . Anxiety   . Atrial fibrillation (HCC)   . CHF (congestive heart failure) (HCC)   . Chronic kidney disease    not on dialysis yet, Tues, thurs, sat  . GERD (gastroesophageal reflux disease)   . H/O metabolic acidosis   . Hyperlipidemia   . Hyperparathyroidism, secondary (HCC)   . Hypertension    Dr. Regino Schultze, [place], [place]  . Oxygen dependent   . Vitamin D deficiency     Patient Active Problem List   Diagnosis Date Noted  . GIB (gastrointestinal bleeding) 02/16/2017  . Encounter for therapeutic drug monitoring 12/03/2016  . Generalized anxiety disorder 11/12/2016  . Atrial fibrillation with RVR (HCC) 11/01/2016  . Acute respiratory failure (HCC) 11/01/2016  . URI (upper respiratory infection) 10/30/2016  . ESRD (end stage renal disease) (HCC) 10/29/2016  . Benign essential HTN 10/29/2016  . GERD (gastroesophageal reflux disease) 10/29/2016   . HLD (hyperlipidemia) 10/29/2016  . Acute on chronic diastolic CHF (congestive heart failure) (HCC) 10/29/2016  . Pulmonary edema 09/26/2016  . Mechanical complication of other vascular device, implant, and graft 08/07/2013  . Anemia 02/11/2012    Past Surgical History:  Procedure Laterality Date  . ABDOMINAL HYSTERECTOMY    . AV FISTULA PLACEMENT  04/01/2012   Procedure: ARTERIOVENOUS (AV) FISTULA CREATION;  Surgeon: Sherren Kerns, MD;  Location: [facility] OR;  Service: Vascular;  Laterality: Left;  . CHOLECYSTECTOMY    . COLON SURGERY     for a blockage  . EYE SURGERY     bilateral cataracts, /w IOL - 2013  . FISTULOGRAM N/A 11/05/2014   Procedure: FISTULOGRAM;  Surgeon: Larina Earthly, MD;  Location: [facility] CATH LAB;  Service: Cardiovascular;  Laterality: N/A;  . INSERTION OF DIALYSIS CATHETER  04/10/2012   Procedure: INSERTION OF DIALYSIS CATHETER;  Surgeon: Pryor Ochoa, MD;  Location: [facility] OR;  Service: Vascular;  Laterality: N/A;  Insertion Diatek Catheter Right Internal Jugular  . LIGATION OF COMPETING BRANCHES OF ARTERIOVENOUS FISTULA  11/07/2012   Procedure: LIGATION OF COMPETING BRANCHES OF ARTERIOVENOUS FISTULA;  Surgeon: Chuck Hint, MD;  Location: [facility] OR;  Service: Vascular;  Laterality: Left;  Brachio/Cephalic Fistula  . SHUNTOGRAM N/A 11/03/2012   Procedure: Betsey Amen;  Surgeon: Chuck Hint, MD;  Location: [facility] CATH LAB;  Service: Cardiovascular;  Laterality: N/A;  . SHUNTOGRAM Left 03/09/2013   Procedure: Fistulogram;  Surgeon: Ottie Glazier  Imogene Burn, MD;  Location: [facility] CATH LAB;  Service: Cardiovascular;  Laterality: Left;  . SHUNTOGRAM Left 11/23/2013   Procedure: FISTULOGRAM;  Surgeon: Fransisco Hertz, MD;  Location: [facility] CATH LAB;  Service: Cardiovascular;  Laterality: Left;    Current Outpatient Rx  . Order #: [id number] Class: Historical Med  . Order #: [id number] Class: Historical Med  . Order #: [id number] Class: Normal  . Order #: [id number] Class: Normal  . Order #:  [id number] Class: Historical Med  . Order #: [id number] Class: Print  . Order #: [id number] Class: Historical Med  . Order #: [id number] Class: Historical Med  . Order #: [id number] Class: Historical Med  . Order #: [id number] Class: Historical Med  . Order #: [id number] Class: Historical Med  . Order #: [id number] Class: Fax    Allergies Patient has no known allergies.  Family History  Problem Relation Age of Onset  . Stroke Mother   . Cancer Father   . Heart attack Father     Social History Social History  Substance Use Topics  . Smoking status: Never Smoker  . Smokeless tobacco: Never Used  . Alcohol use No    Review of Systems  Constitutional: No fever/chills Eyes: No visual changes. ENT: No sore throat. Cardiovascular: Denies chest pain. Respiratory: Denies shortness of breath. Gastrointestinal: No abdominal pain.  No nausea, no vomiting. Positive black diarrhea.  No constipation. Genitourinary: Negative for dysuria. Musculoskeletal: Negative for back pain. Skin: Negative for rash. Neurological: Negative for focal weakness or numbness. Positive HA.   10-point ROS otherwise negative.  ____________________________________________   PHYSICAL EXAM:  VITAL SIGNS: ED Triage Vitals  Enc Vitals Group     BP 02/16/17 0718 (!) 143/79     Pulse Rate 02/16/17 0718 (!) 129     Resp 02/16/17 0718 20     Temp 02/16/17 0718 98.6 F (37 C)     Temp Source 02/16/17 0718 Oral     SpO2 02/16/17 0718 100 %     Weight 02/16/17 0713 175 lb (79.4 kg)     Height 02/16/17 0713 5\' 4"  (1.626 m)     Pain Score 02/16/17 0712 9   Constitutional: Alert and oriented. Well appearing and in no acute distress. Eyes: Conjunctivae are normal.  Head: Atraumatic. Nose: No congestion/rhinnorhea. Mouth/Throat: Mucous membranes are moist.  Oropharynx non-erythematous. Neck: No stridor.   Cardiovascular: Normal rate, regular rhythm. Good peripheral circulation. Grossly normal heart sounds.     Respiratory: Normal respiratory effort.  No retractions. Lungs CTAB. Gastrointestinal: Soft and nontender. No distention. Positive scant black material on rectal exam. No BRB.  Musculoskeletal: No lower extremity tenderness nor edema. No gross deformities of extremities. Neurologic:  Normal speech and language. No gross focal neurologic deficits are appreciated.  Skin:  Skin is warm, dry and intact. No rash noted.  ____________________________________________   LABS (all labs ordered are listed, but only abnormal results are displayed)  Labs Reviewed  CBC WITH DIFFERENTIAL/PLATELET - Abnormal; Notable for the following:       Result Value   WBC 11.3 (*)    RBC 2.45 (*)    Hemoglobin 7.3 (*)    HCT 23.2 (*)    Neutro Abs 9.3 (*)    All other components within normal limits  PROTIME-INR - Abnormal; Notable for the following:    Prothrombin Time 41.4 (*)    INR 4.16 (*)    All other components within normal limits  COMPREHENSIVE METABOLIC PANEL - Abnormal; Notable for the following:    CO2 21 (*)  Glucose, Bld 129 (*)    BUN 120 (*)    Creatinine, Ser 6.70 (*)    Calcium 8.4 (*)    Total Protein 5.2 (*)    Albumin 2.4 (*)    AST 13 (*)    ALT 13 (*)    Alkaline Phosphatase 37 (*)    Total Bilirubin 1.6 (*)    GFR calc non Af Amer 5 (*)    GFR calc Af Amer 6 (*)    All other components within normal limits  LIPASE, BLOOD  URINALYSIS, ROUTINE W REFLEX MICROSCOPIC  HEMOGLOBIN AND HEMATOCRIT, BLOOD  I-STAT TROPOININ, ED  POC OCCULT BLOOD, ED  TYPE AND SCREEN   ____________________________________________  EKG   EKG Interpretation  Date/Time:  Saturday February 16 2017 07:13:50 EDT Ventricular Rate:  153 PR Interval:    QRS Duration: 83 QT Interval:  287 QTC Calculation: 458 R Axis:   0 Text Interpretation:  Atrial fibrillation with rapid V-rate Probable LVH with secondary repol abnrm No STEMI.  Confirmed by Leeona Mccardle MD, Stewart Sasaki [phone]) on 02/16/2017 7:29:19 AM        ____________________________________________  RADIOLOGY  Ct Head Wo Contrast  Result Date: 02/16/2017 CLINICAL DATA:  Headache since a fall 2 weeks ago. The patient is on Coumadin. EXAM: CT HEAD WITHOUT CONTRAST TECHNIQUE: Contiguous axial images were obtained from the base of the skull through the vertex without intravenous contrast. COMPARISON:  None. FINDINGS: Brain: There is some cortical atrophy and chronic microvascular ischemic change. No evidence of acute abnormality including hemorrhage, infarct, mass lesion, mass effect, midline shift or abnormal extra-axial fluid collections identified. No hydrocephalus or pneumocephalus. Vascular: Atherosclerosis noted. Skull: Intact. Sinuses/Orbits: No acute abnormality. Other: None. IMPRESSION: No acute abnormality. Mild atrophy and chronic microvascular ischemic change. Electronically Signed   By: Drusilla Kanner M.D.   On: 02/16/2017 09:07    ____________________________________________   PROCEDURES  Procedure(s) performed:   Procedures  CRITICAL CARE Performed by: Maia Plan Total critical care time: 45 minutes Critical care time was exclusive of separately billable procedures and treating other patients. Critical care was necessary to treat or prevent imminent or life-threatening deterioration. Critical care was time spent personally by me on the following activities: development of treatment plan with patient and/or surrogate as well as nursing, discussions with consultants, evaluation of patient's response to treatment, examination of patient, obtaining history from patient or surrogate, ordering and performing treatments and interventions, ordering and review of laboratory studies, ordering and review of radiographic studies, pulse oximetry and re-evaluation of patient's condition.  Alona Bene, MD Emergency Medicine  ____________________________________________   INITIAL IMPRESSION / ASSESSMENT AND PLAN / ED  COURSE  Pertinent labs & imaging results that were available during my care of the patient were reviewed by me and considered in my medical decision making (see chart for details).  Patient resents to the emergency department for evaluation of black stool over the past 12 hours. She is on Coumadin for A. fib. She arrives in A. fib with RVR having missed her morning rate control medications. She is on both diltiazem and metoprolol. Rectal exam shows scant, dark stool. Not clearly melena. Will send for hemoccult. No abdominal tenderness on exam. No focal neurological deficits. Plan for labs, CT head with HA after fall on coumadin to r/o SDH, and reassess. Will give IV metoprolol and PO rate control medication this AM.   09:01 AM Discussed patient's case with Hosptialist. Patient and family (if present) updated with plan. Care transferred  to hospitalist service.  I reviewed all nursing notes, vitals, pertinent old records, EKGs, labs, imaging (as available).  Will page local GI and Nephrology services. Anticipate patient staying at [facility].   09:14 AM  Spoke with Dr. Kristian Covey with Nephrology regarding the patient. Will see the patient in consultation today to assist with HD.   10:19 AM Spoke with GI Dr. Karilyn Cota. No study today so patient does not need to be NPO. Would like INR < 2.0 prior to endoscopy. Will see in consultation.  ____________________________________________  FINAL CLINICAL IMPRESSION(S) / ED DIAGNOSES  Final diagnoses:  Gastrointestinal hemorrhage with melena  Acute post-traumatic headache, not intractable     MEDICATIONS GIVEN DURING THIS VISIT:  Medications  metoprolol tartrate (LOPRESSOR) tablet 25 mg (not administered)  diltiazem (CARDIZEM CD) 24 hr capsule 240 mg (not administered)  metoprolol (LOPRESSOR) injection 5 mg (5 mg Intravenous Given 02/16/17 0747)  ondansetron (ZOFRAN) injection 4 mg (4 mg Intravenous Given 02/16/17 0747)  pantoprazole (PROTONIX) injection 80  mg (80 mg Intravenous Given 02/16/17 0919)  phytonadione (VITAMIN K) SQ injection 10 mg (10 mg Subcutaneous Given 02/16/17 0919)  sodium chloride 0.9 % bolus 250 mL (500 mLs Intravenous New Bag/Given 02/16/17 0919)  acetaminophen (TYLENOL) tablet 650 mg (650 mg Oral Given 02/16/17 0919)     NEW OUTPATIENT MEDICATIONS STARTED DURING THIS VISIT:  None   Note:  This document was prepared using Dragon voice recognition software and may include unintentional dictation errors.  Alona Bene, MD Emergency Medicine   Maia Plan, MD 02/16/17 1021

## 2017-02-17 ENCOUNTER — Inpatient Hospital Stay (HOSPITAL_COMMUNITY): Payer: Medicare Other

## 2017-02-17 ENCOUNTER — Encounter (HOSPITAL_COMMUNITY)
Admission: EM | Disposition: A | Discharge status: Skilled Nursing Facility | Payer: Self-pay | Source: Home / Self Care | Attending: Internal Medicine

## 2017-02-17 ENCOUNTER — Encounter (HOSPITAL_COMMUNITY): Payer: Self-pay

## 2017-02-17 ENCOUNTER — Other Ambulatory Visit: Payer: Self-pay

## 2017-02-17 DIAGNOSIS — I5033 Acute on chronic diastolic (congestive) heart failure: Secondary | ICD-10-CM

## 2017-02-17 DIAGNOSIS — K254 Chronic or unspecified gastric ulcer with hemorrhage: Secondary | ICD-10-CM

## 2017-02-17 DIAGNOSIS — D62 Acute posthemorrhagic anemia: Secondary | ICD-10-CM

## 2017-02-17 DIAGNOSIS — G44319 Acute post-traumatic headache, not intractable: Secondary | ICD-10-CM

## 2017-02-17 HISTORY — PX: ESOPHAGOGASTRODUODENOSCOPY: SHX5428

## 2017-02-17 LAB — BPAM RBC
Blood Product Expiration Date: 201805112359
Blood Product Expiration Date: 201805112359
ISSUE DATE / TIME: 201804211115
ISSUE DATE / TIME: 201804212211
UNIT TYPE AND RH: 6200
Unit Type and Rh: 6200

## 2017-02-17 LAB — CBC
HCT: 16.6 % — ABNORMAL LOW (ref 36.0–46.0)
HEMATOCRIT: 27.7 % — AB (ref 36.0–46.0)
Hemoglobin: 5.6 g/dL — CL (ref 12.0–15.0)
Hemoglobin: 9.5 g/dL — ABNORMAL LOW (ref 12.0–15.0)
MCH: 29.5 pg (ref 26.0–34.0)
MCH: 28.4 pg (ref 26.0–34.0)
MCHC: 33.7 g/dL (ref 30.0–36.0)
MCHC: 34.3 g/dL (ref 30.0–36.0)
MCV: 87.4 fL (ref 78.0–100.0)
MCV: 82.7 fL (ref 78.0–100.0)
PLATELETS: 140 10*3/uL — AB (ref 150–400)
Platelets: 138 10*3/uL — ABNORMAL LOW (ref 150–400)
RBC: 1.9 MIL/uL — ABNORMAL LOW (ref 3.87–5.11)
RBC: 3.35 MIL/uL — ABNORMAL LOW (ref 3.87–5.11)
RDW: 15.2 % (ref 11.5–15.5)
RDW: 16.5 % — AB (ref 11.5–15.5)
WBC: 11 10*3/uL — ABNORMAL HIGH (ref 4.0–10.5)
WBC: 16.1 10*3/uL — AB (ref 4.0–10.5)

## 2017-02-17 LAB — PROTIME-INR
INR: 2.7
INR: 1.5
INR: 1.47
PROTHROMBIN TIME: 17.9 s — AB (ref 11.4–15.2)
Prothrombin Time: 29.2 seconds — ABNORMAL HIGH (ref 11.4–15.2)
Prothrombin Time: 18.3 seconds — ABNORMAL HIGH (ref 11.4–15.2)

## 2017-02-17 LAB — TYPE AND SCREEN
ABO/RH(D): A POS
Antibody Screen: NEGATIVE
UNIT DIVISION: 0
Unit division: 0

## 2017-02-17 LAB — BASIC METABOLIC PANEL
ANION GAP: 12 (ref 5–15)
Anion gap: 16 — ABNORMAL HIGH (ref 5–15)
Anion gap: 14 (ref 5–15)
BUN: 145 mg/dL — AB (ref 6–20)
BUN: 127 mg/dL — ABNORMAL HIGH (ref 6–20)
BUN: 150 mg/dL — AB (ref 6–20)
CALCIUM: 8.8 mg/dL — AB (ref 8.9–10.3)
CHLORIDE: 112 mmol/L — AB (ref 101–111)
CO2: 17 mmol/L — ABNORMAL LOW (ref 22–32)
CO2: 14 mmol/L — ABNORMAL LOW (ref 22–32)
CO2: 17 mmol/L — ABNORMAL LOW (ref 22–32)
CREATININE: 7.84 mg/dL — AB (ref 0.44–1.00)
Calcium: 7.6 mg/dL — ABNORMAL LOW (ref 8.9–10.3)
Calcium: 9.2 mg/dL (ref 8.9–10.3)
Chloride: 106 mmol/L (ref 101–111)
Chloride: 107 mmol/L (ref 101–111)
Creatinine, Ser: 7.72 mg/dL — ABNORMAL HIGH (ref 0.44–1.00)
Creatinine, Ser: 6.44 mg/dL — ABNORMAL HIGH (ref 0.44–1.00)
GFR calc Af Amer: 5 mL/min — ABNORMAL LOW (ref >60)
GFR calc Af Amer: 5 mL/min — ABNORMAL LOW (ref >60)
GFR calc non Af Amer: 5 mL/min — ABNORMAL LOW (ref >60)
GFR calc non Af Amer: 4 mL/min — ABNORMAL LOW (ref >60)
GFR, EST AFRICAN AMERICAN: 6 mL/min — AB (ref >60)
GFR, EST NON AFRICAN AMERICAN: 4 mL/min — AB (ref >60)
GLUCOSE: 166 mg/dL — AB (ref 65–99)
GLUCOSE: 107 mg/dL — AB (ref 65–99)
Glucose, Bld: 173 mg/dL — ABNORMAL HIGH (ref 65–99)
POTASSIUM: 5.3 mmol/L — AB (ref 3.5–5.1)
POTASSIUM: 4.1 mmol/L (ref 3.5–5.1)
Potassium: 5.2 mmol/L — ABNORMAL HIGH (ref 3.5–5.1)
SODIUM: 138 mmol/L (ref 135–145)
Sodium: 139 mmol/L (ref 135–145)
Sodium: 138 mmol/L (ref 135–145)

## 2017-02-17 LAB — PREPARE FRESH FROZEN PLASMA
UNIT DIVISION: 0
Unit division: 0
Unit division: 0
Unit division: 0

## 2017-02-17 LAB — BPAM FFP
BLOOD PRODUCT EXPIRATION DATE: 201804262359
Blood Product Expiration Date: 201804242359
Blood Product Expiration Date: 201804262359
Blood Product Expiration Date: 201804262359
ISSUE DATE / TIME: 201804211222
ISSUE DATE / TIME: 201804211745
ISSUE DATE / TIME: 201804211828
ISSUE DATE / TIME: 201804211922
UNIT TYPE AND RH: 6200
UNIT TYPE AND RH: 6200
UNIT TYPE AND RH: 6200
Unit Type and Rh: 6200

## 2017-02-17 LAB — POCT I-STAT 3, ART BLOOD GAS (G3+)
ACID-BASE DEFICIT: 8 mmol/L — AB (ref 0.0–2.0)
Bicarbonate: 18.5 mmol/L — ABNORMAL LOW (ref 20.0–28.0)
O2 SAT: 100 %
PH ART: 7.274 — AB (ref 7.350–7.450)
Patient temperature: 97.6
TCO2: 20 mmol/L (ref 0–100)
pCO2 arterial: 39.6 mmHg (ref 32.0–48.0)
pO2, Arterial: 394 mmHg — ABNORMAL HIGH (ref 83.0–108.0)

## 2017-02-17 LAB — PREPARE RBC (CROSSMATCH)

## 2017-02-17 LAB — HEMOGLOBIN AND HEMATOCRIT, BLOOD
HCT: 26.4 % — ABNORMAL LOW (ref 36.0–46.0)
Hemoglobin: 9.2 g/dL — ABNORMAL LOW (ref 12.0–15.0)

## 2017-02-17 LAB — PHOSPHORUS: Phosphorus: 5.1 mg/dL — ABNORMAL HIGH (ref 2.5–4.6)

## 2017-02-17 LAB — ABO/RH: ABO/RH(D): A POS

## 2017-02-17 LAB — GLUCOSE, CAPILLARY
GLUCOSE-CAPILLARY: 105 mg/dL — AB (ref 65–99)
GLUCOSE-CAPILLARY: 148 mg/dL — AB (ref 65–99)

## 2017-02-17 LAB — TROPONIN I
TROPONIN I: 0.03 ng/mL — AB (ref <0.03)
Troponin I: 0.03 ng/mL (ref <0.03)
Troponin I: 0.04 ng/mL (ref <0.03)

## 2017-02-17 LAB — BLOOD PRODUCT ORDER (VERBAL) VERIFICATION

## 2017-02-17 LAB — MAGNESIUM: Magnesium: 1.5 mg/dL — ABNORMAL LOW (ref 1.7–2.4)

## 2017-02-17 LAB — TRIGLYCERIDES: TRIGLYCERIDES: 270 mg/dL — AB (ref <150)

## 2017-02-17 SURGERY — EGD (ESOPHAGOGASTRODUODENOSCOPY)
Anesthesia: Moderate Sedation

## 2017-02-17 MED ORDER — PROPOFOL 1000 MG/100ML IV EMUL
5.0000 ug/kg/min | INTRAVENOUS | Status: DC
  Administered 2017-02-17 (×2): 60 ug/kg/min via INTRAVENOUS
  Administered 2017-02-17: 50 ug/kg/min via INTRAVENOUS
  Administered 2017-02-17 – 2017-02-18 (×3): 60 ug/kg/min via INTRAVENOUS
  Administered 2017-02-18: 50 ug/kg/min via INTRAVENOUS
  Administered 2017-02-18: 25 ug/kg/min via INTRAVENOUS
  Administered 2017-02-19: 20 ug/kg/min via INTRAVENOUS
  Filled 2017-02-17 (×8): qty 100

## 2017-02-17 MED ORDER — FENTANYL CITRATE (PF) 100 MCG/2ML IJ SOLN
INTRAMUSCULAR | Status: AC
  Filled 2017-02-17: qty 4

## 2017-02-17 MED ORDER — MAGNESIUM SULFATE 2 GM/50ML IV SOLN
2.0000 g | Freq: Once | INTRAVENOUS | Status: AC
  Administered 2017-02-17: 2 g via INTRAVENOUS
  Filled 2017-02-17: qty 50

## 2017-02-17 MED ORDER — FENTANYL CITRATE (PF) 100 MCG/2ML IJ SOLN
25.0000 ug | Freq: Once | INTRAMUSCULAR | Status: AC
  Administered 2017-02-17: 25 ug via INTRAVENOUS
  Filled 2017-02-17: qty 2

## 2017-02-17 MED ORDER — FENTANYL CITRATE (PF) 100 MCG/2ML IJ SOLN
INTRAMUSCULAR | Status: AC
  Filled 2017-02-17: qty 2

## 2017-02-17 MED ORDER — BUTAMBEN-TETRACAINE-BENZOCAINE 2-2-14 % EX AERO
INHALATION_SPRAY | CUTANEOUS | Status: DC | PRN
  Administered 2017-02-17: 2 via TOPICAL

## 2017-02-17 MED ORDER — ORAL CARE MOUTH RINSE
15.0000 mL | Freq: Four times a day (QID) | OROMUCOSAL | Status: DC
  Administered 2017-02-17 – 2017-02-19 (×9): 15 mL via OROMUCOSAL

## 2017-02-17 MED ORDER — PROPOFOL 1000 MG/100ML IV EMUL
INTRAVENOUS | Status: AC
  Administered 2017-02-17: 11:00:00
  Filled 2017-02-17: qty 100

## 2017-02-17 MED ORDER — MIDAZOLAM HCL 5 MG/ML IJ SOLN
INTRAMUSCULAR | Status: AC
  Filled 2017-02-17: qty 2

## 2017-02-17 MED ORDER — SODIUM CHLORIDE 0.9 % IV SOLN: Freq: Once | INTRAVENOUS | Status: DC

## 2017-02-17 MED ORDER — FENTANYL CITRATE (PF) 100 MCG/2ML IJ SOLN
INTRAMUSCULAR | Status: AC
  Administered 2017-02-17: 10:00:00
  Filled 2017-02-17: qty 2

## 2017-02-17 MED ORDER — METOCLOPRAMIDE HCL 5 MG/ML IJ SOLN
5.0000 mg | Freq: Four times a day (QID) | INTRAMUSCULAR | Status: AC
  Administered 2017-02-17 – 2017-02-18 (×3): 5 mg via INTRAVENOUS
  Filled 2017-02-17 (×5): qty 1

## 2017-02-17 MED ORDER — SODIUM CHLORIDE 0.9 % IV BOLUS (SEPSIS)
1000.0000 mL | Freq: Once | INTRAVENOUS | Status: AC
  Administered 2017-02-17: 1000 mL via INTRAVENOUS

## 2017-02-17 MED ORDER — MIDAZOLAM HCL 10 MG/2ML IJ SOLN
INTRAMUSCULAR | Status: DC | PRN
  Administered 2017-02-17 (×3): 1 mg via INTRAVENOUS

## 2017-02-17 MED ORDER — CHLORHEXIDINE GLUCONATE 0.12% ORAL RINSE (MEDLINE KIT)
15.0000 mL | Freq: Two times a day (BID) | OROMUCOSAL | Status: DC
  Administered 2017-02-17 – 2017-02-19 (×5): 15 mL via OROMUCOSAL

## 2017-02-17 MED ORDER — ACETAMINOPHEN 650 MG RE SUPP
650.0000 mg | RECTAL | Status: DC | PRN
  Filled 2017-02-17: qty 1

## 2017-02-17 MED ORDER — EPINEPHRINE PF 1 MG/10ML IJ SOSY
PREFILLED_SYRINGE | INTRAMUSCULAR | Status: AC
  Administered 2017-02-17: 12:00:00
  Filled 2017-02-17: qty 10

## 2017-02-17 MED ORDER — VITAMIN K1 10 MG/ML IJ SOLN
5.0000 mg | Freq: Once | INTRAMUSCULAR | Status: AC
Start: 2017-02-17 — End: 2017-02-17
  Administered 2017-02-17: 5 mg via INTRAVENOUS
  Filled 2017-02-17: qty 0.5

## 2017-02-17 NOTE — Progress Notes (Signed)
Pt sbp 80s cardizem drip stopped, pt anxious, c/o bilateral leg pain and chest pain, lungs clear to auscultation, Dr Arsenio Loader informed of all. Will send lab work and infuse 1 liter ns now per md

## 2017-02-17 NOTE — Progress Notes (Addendum)
[facility] Progress Note   Subjective: continued to bleed, has rec'd total of 6u prbc's and 6u FFP.  Had EGD today, required intubation pre-procedure.  Found lots of blood clots in the stomach , found a sig ulcer and was treated.    Vitals:   02/17/17 1100 02/17/17 1101 02/17/17 1102 02/17/17 1103  BP: (!) 120/94     Pulse: 91 (!) 109 68 84  Resp: _0 Temp:      TempSrc:      SpO2: 100% 100% 100% 100%  Weight:      Height:        Inpatient medications: . chlorhexidine gluconate (MEDLINE KIT)  15 mL Mouth Rinse BID  . EPINEPHrine      . mouth rinse  15 mL Mouth Rinse QID  . [START ON 02/19/2017] pantoprazole  40 mg Intravenous Q12H  . sodium chloride flush  3 mL Intravenous Q12H   . sodium chloride    . sodium chloride 75 mL/hr at 02/17/17 0137  . sodium chloride    . sodium chloride    . sodium chloride    . diltiazem (CARDIZEM) infusion 10 mg/hr (02/17/17 0558)  . pantoprozole (PROTONIX) infusion 8 mg/hr (02/17/17 0700)  . propofol (DIPRIVAN) infusion 60 mcg/kg/min (02/17/17 1331)   acetaminophen, ondansetron **OR** ondansetron (ZOFRAN) IV  Exam: Gen pale elderly AAF, intubated and sedated Sclera anicteric, throat w ETT No jvd or bruits Chest coarse BS bilat, no wheezing Cor irreg irreg no RG Abd soft ntnd no mass or ascites +bs obese MS no joint effusions or deformity Ext trace LE edema / no wounds or ulcers Neuro sedated on the vent LUA AVF +bruit, no signs of infection   CXR 4/22 at 11 am > no edema or infiltrates  Dialysis: TTS Dr Hinda Lenis, unknown dry wt   Assessment: 1. GI bleed - major bleed, 2u prbc/ 3u FFP yesterday and 4u prbc/ partial 2u FFP since midnight.  SP EGD with rx of ulcer per GI.  2. VDRF - intubated for airway protection during EGD 3. ESRD usual HD TTS. High BUN due to bleed, Cr and K ok.  Missed HD yest.  4. Anemia of ABL/ CKD - sp 6u prbcs. Not sure if on esa or not at center.  5. HTN - on dilt and  metoprolol at home, on hold here. Getting IV diltiazem for afib/ RVR.   6. COPD - O2 dependent at home 7. Chronic afib - w/ RVR today/ BP's   Plan - plan HD later today or night since she is more stable   Kelly Splinter MD [place] pager [phone]   02/17/2017, 1:32 PM    Recent Labs Lab 02/16/17 0741 02/17/17 0430  NA 137 139  K 4.4 5.3*  CL 102 106  CO2 21* 17*  GLUCOSE 129* 173*  BUN 120* 145*  CREATININE 6.70* 7.72*  CALCIUM 8.4* 8.8*    Recent Labs Lab 02/16/17 0741  AST 13*  ALT 13*  ALKPHOS 37*  BILITOT 1.6*  PROT 5.2*  ALBUMIN 2.4*    Recent Labs Lab 02/16/17 0741 02/16/17 1035 02/16/17 2142 02/17/17 0430  WBC 11.3*  --   --  11.0*  NEUTROABS 9.3*  --   --   --   HGB 7.3* 6.6* 7.5* 5.6*  HCT 23.2* 21.1* 22.7* 16.6*  MCV 94.7  --   --  87.4  PLT 306  --   --  140*   Iron/TIBC/Ferritin/ %  Sat No results found for: IRON, TIBC, FERRITIN, IRONPCTSAT

## 2017-02-17 NOTE — H&P (View-Only) (Signed)
[facility] Consult Note   History Patricia Ayala MRN # [id number]  Date of Admission: 02/16/2017 Date of Consultation: 02/16/2017 Referring physician: Dr. Alexandria U Kadolph, MD  Reason for Consultation/Chief Complaint: Melena  Subjective  HPI:  This is an 83-year-old woman who presented to [facility] early this morning with melena. She was seen at dialysis, and was noted to have had several days of passing black tarry stool. She was found to have a very low hemoglobin was referred to the ED. The patient then had hematemesis in the [facility] EGD in her blood pressure was low. As no critical care was available at that facility, she was transferred to [facility]. The patient is a very poor historian. She reports taking aspirin "sometimes" and Aleve twice a day for unspecified pain. She has been on Coumadin for atrial fibrillation over the last 6 or 7 weeks, and says it is been difficult to get control. Her INR was over 4 today. She currently denies chest pain or dyspnea. So far she has had 2 units of PRBCs with a third unit planned, and 2 units of FFP are currently being infused. NG tube was placed upon arrival, and so far is only draining a scant amount of red blood.  ROS:  She complains of generalized fatigue  All other systems are negative except as noted above in the HPI  Past Medical History Past Medical History:  Diagnosis Date  . Anemia 02/11/2012  . Anxiety   . Atrial fibrillation (HCC)   . CHF (congestive heart failure) (HCC)   . Chronic kidney disease    not on dialysis yet, Tues, thurs, sat  . GERD (gastroesophageal reflux disease)   . H/O metabolic acidosis   . Hyperlipidemia   . Hyperparathyroidism, secondary (HCC)   . Hypertension    Dr. McGough, [place], [place]  . Oxygen dependent   . Vitamin D deficiency     Past Surgical History Past Surgical History:  Procedure Laterality Date  . ABDOMINAL HYSTERECTOMY    . AV FISTULA PLACEMENT   04/01/2012   Procedure: ARTERIOVENOUS (AV) FISTULA CREATION;  Surgeon: Charles E Fields, MD;  Location: [facility] OR;  Service: Vascular;  Laterality: Left;  . CHOLECYSTECTOMY    . COLON SURGERY     for a blockage  . EYE SURGERY     bilateral cataracts, /w IOL - 2013  . FISTULOGRAM N/A 11/05/2014   Procedure: FISTULOGRAM;  Surgeon: Todd F Early, MD;  Location: [facility] CATH LAB;  Service: Cardiovascular;  Laterality: N/A;  . INSERTION OF DIALYSIS CATHETER  04/10/2012   Procedure: INSERTION OF DIALYSIS CATHETER;  Surgeon: James D Lawson, MD;  Location: [facility] OR;  Service: Vascular;  Laterality: N/A;  Insertion Diatek Catheter Right Internal Jugular  . LIGATION OF COMPETING BRANCHES OF ARTERIOVENOUS FISTULA  11/07/2012   Procedure: LIGATION OF COMPETING BRANCHES OF ARTERIOVENOUS FISTULA;  Surgeon: Christopher S Dickson, MD;  Location: [facility] OR;  Service: Vascular;  Laterality: Left;  Brachio/Cephalic Fistula  . SHUNTOGRAM N/A 11/03/2012   Procedure: SHUNTOGRAM;  Surgeon: Christopher S Dickson, MD;  Location: [facility] CATH LAB;  Service: Cardiovascular;  Laterality: N/A;  . SHUNTOGRAM Left 03/09/2013   Procedure: Fistulogram;  Surgeon: Brian L Chen, MD;  Location: [facility] CATH LAB;  Service: Cardiovascular;  Laterality: Left;  . SHUNTOGRAM Left 11/23/2013   Procedure: FISTULOGRAM;  Surgeon: Brian L Chen, MD;  Location: [facility] CATH LAB;  Service: Cardiovascular;  Laterality: Left;    Family History Family History  Problem   Relation Age of Onset  . Stroke Mother   . Cancer Father   . Heart attack Father     Social History Social History   Social History  . Marital status: Married    Spouse name: N/A  . Number of children: N/A  . Years of education: N/A   Social History Main Topics  . Smoking status: Never Smoker  . Smokeless tobacco: Never Used  . Alcohol use No  . Drug use: No  . Sexual activity: Not Asked   Other Topics Concern  . None   Social History Narrative  . None    Allergies No Known  Allergies  Outpatient Meds Home medications from the H+P and/or nursing med reconciliation reviewed.  Inpatient med list reviewed  _____________________________________________________________________ Objective   Exam:  Current vital signs  Patient Vitals for the past 8 hrs:  BP Temp Temp src Pulse Resp SpO2  02/16/17 1926 - 98.4 F (36.9 C) Oral - - -  02/16/17 1902 121/70 98.6 F (37 C) Oral (!) 129 (!) 23 100 %  02/16/17 1841 - 97.8 F (36.6 C) Oral - - 100 %  02/16/17 1805 - 98.4 F (36.9 C) Oral (!) 146 (!) 21 100 %  02/16/17 1800 117/71 - - (!) 140 (!) 23 100 %  02/16/17 1750 - 97.9 F (36.6 C) - (!) 110 20 100 %  02/16/17 1730 (!) 134/91 - - (!) 158 (!) 26 100 %  02/16/17 1700 (!) 126/93 - - (!) 133 (!) 23 100 %  02/16/17 1630 109/81 - - 68 (!) 29 99 %  02/16/17 1627 (!) 126/94 98.8 F (37.1 C) Oral (!) 150 (!) 29 100 %  02/16/17 1600 (!) 140/130 - - (!) 136 19 100 %  02/16/17 1545 116/80 98.6 F (37 C) Oral (!) 164 20 100 %  02/16/17 1500 105/82 - - 78 18 100 %  02/16/17 1430 102/85 - - 99 (!) 21 97 %  02/16/17 1405 (!) 130/111 97.7 F (36.5 C) Oral 95 20 100 %  02/16/17 1233 (!) 94/51 - - (!) 144 (!) 26 100 %  02/16/17 1216 (!) 88/43 - - - (!) 23 -  02/16/17 1215 - - - - - 100 %  02/16/17 1206 (!) 97/57 - - 60 (!) 26 100 %  02/16/17 1205 (!) 71/61 - - - 20 -  02/16/17 1204 (!) 79/49 - - - (!) 22 -    Intake/Output Summary (Last 24 hours) at 02/16/17 2003 Last data filed at 02/16/17 1926  Gross per 24 hour  Intake             1742 ml  Output              150 ml  Net             1592 ml    Physical Exam:    General: this is a Chronically ill-appearing elderly female patient in no acute distress  Eyes: sclera anicteric, no redness  ENT: oral mucosa moist without lesions, no cervical or supraclavicular lymphadenopathy, poor dentition  CV: Tachycardic and irregular, rate makes it difficult to appreciate any murmur, no JVD,, no peripheral  edema  Resp: clear to auscultation bilaterally, normal RR and effort noted  GI: soft, no tenderness, with active bowel sounds. No guarding or palpable organomegaly noted. She has several scars  Skin; warm and dry, no rash or jaundice noted  Neuro: awake, alert and oriented x 3. Normal gross motor   function and fluent speech. Palpable thrill over her left upper extremity fistula Labs:   Recent Labs Lab 02/16/17 0741 02/16/17 1035  WBC 11.3*  --   HGB 7.3* 6.6*  HCT 23.2* 21.1*  PLT 306  --     Recent Labs Lab 02/16/17 0741  NA 137  K 4.4  CL 102  CO2 21*  BUN 120*  ALBUMIN 2.4*  ALKPHOS 37*  ALT 13*  AST 13*  GLUCOSE 129*    Recent Labs Lab 02/16/17 0741  INR 4.16*    Radiologic studies: KUB confirms NG tube placement and stomach  @ASSESSMENTPLANBEGIN@ Impression:  Melena and hematemesis Acute blood loss anemia Rapid atrial fibrillation Chronic anticoagulation, over therapeutic and exacerbating the GI bleeding Chronic kidney disease requiring dialysis  Plan:  suspected peptic ulcer disease from NSAIDs, exacerbated by Coumadin.   The patient is receiving blood products to support her hemoglobin and blood pressure as well as reverse over therapeutic Coumadin. Having difficulty with IV access at this point, especially since her left arm has a fistula and therefore cannot have placement of a peripheral IV. After some plasma goes in, there are plans for placement of a central line by the critical care service.  She has received appropriate Protonix drip and is nothing by mouth  I discussed an upper endoscopy with her, and I am planning to perform it tomorrow morning as long she remains stable. I would like some time for the blood products and medicine to help with hemostasis before performing an endoscopy. The procedure was discussed in detail in the presence of her nurse, and she is agreeable.  The benefits and risks of the planned procedure were described  in detail with the patient or (when appropriate) their health care proxy.  Risks were outlined as including, but not limited to, bleeding, infection, perforation, adverse medication reaction leading to cardiac or pulmonary decompensation, or pancreatitis (if ERCP).  The limitation of incomplete mucosal visualization was also discussed.  No guarantees or warranties were given.  Patient at increased risk for cardiopulmonary complications of procedure due to medical comorbidities.  Further plan to follow per endoscopy   Thank you for the courtesy of this consult.  Please contact me with any questions or concerns.  Luay Balding L Danis III Pager: [phone] Mon-Fri 8a-5p [phone] after 5p, weekends, holidays  

## 2017-02-17 NOTE — Progress Notes (Signed)
eLink Physician-Brief Progress Note Patient Name: Patricia Ayala DOB: July 23, 1929 MRN: [id number]   Date of Service  02/17/2017  HPI/Events of Note  Multiple issues: 1. GI Bleedi - Hgb = 5.6 and 2. Coagulopathy - INR = 2.7. Nurse still waiting for GI to call her back.   ICU Interventions  Will order: 1. Transfuse 3 units PRBC 2. Transfuse 3 units of FFP. 3. Bedside nurse to call Nephrology to try to arrange for hemodialysis earlier in the day to permit administration of multiple units of blood product.     Intervention Category Major Interventions: Other: Intermediate Interventions: Coagulopathy - evaluation and management  Patricia Ayala 02/17/2017, 5:31 AM

## 2017-02-17 NOTE — Progress Notes (Addendum)
eLink Physician-Brief Progress Note/ Patient Name: Patricia Ayala DOB: August 29, 1929 MRN: [id number]   Date of Service  02/17/2017  HPI/Events of Note  Multiple issues: 1. Patient c/o chest pain, and 2. Hypotension - BP = 91/44. Patient having more bloody stools. Note that patient's GI bleeding and hypotension preclude using NTG, Heparin, ASA or B-Blocker.  eICU Interventions  Will order: 1. Bolus with 0.9 NaCl 1 liter IV over 1 hour now. 2. Please send AM CBC and PT/INR now.  3. PO4--- and Mg++ level now. 4. Please inform GI of increase in bloody stools.  5. 12 Lead EKG now. 6. Cycle Troponin.     Intervention Category Major Interventions: Hypotension - evaluation and management  Juana Montini Eugene 02/17/2017, 4:22 AM

## 2017-02-17 NOTE — Progress Notes (Signed)
eLink Physician-Brief Progress Note Patient Name: Patricia Ayala DOB: [birth year] MRN: [id number]   Date of Service  02/17/2017  HPI/Events of Note  Multiple issues: 1. Asked to review CXR for CVL placement and 2. Patient c/o headache and 2. Patient c/o headache. CXR reveals R IJ CVL tip at junction of SVC and R atrium. No pneumothorax.   eICU Interventions  Will order: 1. OK to use R IJ CVL. 2. Fentanyl 25 mcg IV X 1 now.      Intervention Category Intermediate Interventions: Diagnostic test evaluation  Cathleen Yagi Dennard Nip 02/17/2017, 1:39 AM

## 2017-02-17 NOTE — Op Note (Signed)
[facility] Patient Name: Patricia Ayala Procedure Date : 02/17/2017 MRN: [id number] Attending MD: Estill Cotta. Loletha Carrow , MD Date of Birth: 1929/02/09 CSN: [id number] Age: 83 Admit Type: Inpatient Procedure:                Upper GI endoscopy Indications:              Acute post hemorrhagic anemia, Hematemesis, Melena Providers:                Mallie Mussel L. Loletha Carrow, MD, Zenon Mayo, RN, Alfonso Patten,                            Technician Referring MD:              Medicines:                Midazolam 3 mg IV, Propofol per Anesthesia Complications:            No immediate complications. Estimated Blood Loss:     Estimated blood loss: minimal from the endoscopic                            interventions. There was a large amount of blood                            loss from the underlying ulcer. Procedure:                Pre-Anesthesia Assessment:                           - Prior to the procedure, a History and Physical                            was performed, and patient medications and                            allergies were reviewed. The patient's tolerance of                            previous anesthesia was also reviewed. The risks                            and benefits of the procedure and the sedation                            options and risks were discussed with the patient.                            All questions were answered, and informed consent                            was obtained. Prior Anticoagulants: The patient has                            taken Coumadin (warfarin), last dose was 1 day  prior to procedure. ASA Grade Assessment: IV - A                            patient with severe systemic disease that is a                            constant threat to life. After reviewing the risks                            and benefits, the patient was deemed in                            satisfactory condition to undergo the procedure.                  After obtaining informed consent, the endoscope was                            passed under direct vision. Throughout the                            procedure, the patient's blood pressure, pulse, and                            oxygen saturations were monitored continuously. The                            [id number] ([id number]) scope was introduced through the                            mouth, and advanced to the second part of duodenum.                            The upper GI endoscopy was performed with moderate                            difficulty due to excessive bleeding. The patient                            tolerated the procedure. Scope In: Scope Out: Findings:      the procedure was begun using conscious sedation. Once it was determined       that there was a large volume of bleeding , and it would be difficult to       clear the stomach in order to find and treat the bleeding source, the       procedure was aborted. A previous agreement with the ICU team, the       patient was then electively intubated. After confirming good placement       of the ET tube, the upper endoscopy was repeated.      The esophagus was normal.      Red blood was found in the gastric fundus and in the gastric body.       Lavage of the area was performed using a moderate amount, resulting in       incomplete clearance  with continued poor visualization.      extensive efforts were made to clear the blood and clots using lavage,       suction and countless passes of the scope while removing clots with a       Roth net. Progress was made, but only about 50% of the retained blood       and clots could be removed. Nevertheless, the volume of that material       did not seem to be increasing.      One non-bleeding superficial gastric ulcer with a visible vessel was       found on the posterior wall of the proximal gastric body. The lesion was       5 mm in largest dimension. Area was  successfully injected with 14 mL of       a 1:10,000 solution of epinephrine for hemostasis (over 3 different       injections in different spots around the ulcer throughout the course of       the procedure). For hemostasis, one hemostatic clip was successfully       placed (MR conditional) across the base of the ulcer. A second clip was       attempted across the ulcer in order to close the defect, but the clip       failed. The ulcer then began to ooze blood. Further epinephrine       injection and then bipolar cautery was performed. There was no bleeding       at the end of the procedure. Coagulation for hemostasis using bipolar       probe was successful.      The examined duodenum was normal.      the entire endoscopic endeavor, excluding the break during procedures       for intubation and airway control, was approximately 2 hours. Impression:               - Normal esophagus.                           - Red blood in the gastric fundus and in the                            gastric body.                           - Non-bleeding gastric ulcer with a visible vessel.                            Injected. Clip (MR conditional) was placed. Treated                            with bipolar cautery.                           - Normal examined duodenum.                           - No specimens collected. Moderate Sedation:      MAC sedation used Recommendation:           - NPO.                           -  Continue present medications.                           - Remain intubated overnight                           Every 6 hours CBC and PT/INR, Administer blood                            products as needed                           Continue Protonix drip at 8 mg an hour                           Metoclopramide 5 mg IV every 6 hours -4 doses                           OG tube can be placed if necessary to decompress                            stomach. If so, please place it on low  intermittent                            suction.                           Repeat EGD may be necessary in 24-48 hours if it                            seems that the patient is still bleeding. Procedure Code(s):        --- Professional ---                           [id number], Esophagogastroduodenoscopy, flexible,                            transoral; with control of bleeding, any method Diagnosis Code(s):        --- Professional ---                           K92.2, Gastrointestinal hemorrhage, unspecified                           K25.4, Chronic or unspecified gastric ulcer with                            hemorrhage                           D62, Acute posthemorrhagic anemia                           K92.0, Hematemesis                           K92.1, Melena (  includes Hematochezia) CPT copyright 2016 American Medical Association. All rights reserved. The codes documented in this report are preliminary and upon coder review may  be revised to meet current compliance requirements. Edan Juday L. Loletha Carrow, MD 02/17/2017 1:55:42 PM This report has been signed electronically. Number of Addenda: 0

## 2017-02-17 NOTE — Progress Notes (Signed)
Wasted 2mg / 0.59ml versed with Tressia Danas, RN.

## 2017-02-17 NOTE — Progress Notes (Signed)
Called and informed Dr Myrtie Neither, pt hemoglobin 5.6 pt 29.2 inr 2.70 pltc 140, pt with increased bloody stools, stools now frank blood, approx 150cc bloody drainage per ngt. Informed md plan to transfuse 3 units prbc and 3 units ffp per Dr Arsenio Loader orders. Unable to consent pt for upper endo at md request d/t pt poor insight and inattention, md to call and update pt family prior to procedure.

## 2017-02-17 NOTE — Progress Notes (Signed)
Called and updated nephrology md regarding total amounts of blood products and NS boluses given to date, informed md pt to receive additional 3 units prbc and  3 units ffp, lungs remain clear to auscultation. No plans to do HD at this time per md.

## 2017-02-17 NOTE — Interval H&P Note (Signed)
History and Physical Interval Note:  02/17/2017 9:04 AM  Patricia Ayala  has presented today for surgery, with the diagnosis of GI bleeding  The various methods of treatment have been discussed with the patient and family. After consideration of risks, benefits and other options for treatment, the patient has consented to  Procedure(s) with comments: ESOPHAGOGASTRODUODENOSCOPY (EGD) (N/A) - Bedside case in ICU as a surgical intervention .  The patient's history has been reviewed, patient examined, no change in status, stable for surgery.  I have reviewed the patient's chart and labs.  Questions were answered to the patient's satisfaction.    She is continuing to bleed. Seems confused this AM, so got procedure consent from husband and called daughter as well.  Will try for conscious sedation.  If unsuccessful, will have critical care intubate her.   Charlie Pitter III

## 2017-02-17 NOTE — Procedures (Signed)
Central Venous Catheter Insertion Procedure Note Patricia Ayala [id number] February 05, 1929  Procedure: Insertion of Central Venous Catheter Indications: Assessment of intravascular volume, Drug and/or fluid administration and Frequent blood sampling  Procedure Details Consent: Risks of procedure as well as the alternatives and risks of each were explained to the (patient/caregiver).  Consent for procedure obtained. Time Out: Verified patient identification, verified procedure, site/side was marked, verified correct patient position, special equipment/implants available, medications/allergies/relevent history reviewed, required imaging and test results available.  Performed  Maximum sterile technique was used including antiseptics, cap, gloves, gown, hand hygiene, mask and sheet. Skin prep: Chlorhexidine; local anesthetic administered A antimicrobial bonded/coated triple lumen catheter was placed in the right internal jugular vein using the Seldinger technique.  Evaluation Blood flow good Complications: No apparent complications Patient did tolerate procedure well. Chest X-ray ordered to verify placement.  CXR: pending.  Patricia Ayala 02/17/2017, 12:14 AM

## 2017-02-17 NOTE — Consult Note (Signed)
Name: Patricia Ayala MRN: [id number] DOB: 1929/01/02    LOS: 1  PCCM CONSULT NOTE  History of Present Illness:  Ms. Patricia Ayala is an 83yo female with PMH of A fib (recently started on Coumadin ~6weeks ago), CHF, HTN, HLD, ESRD on HD (TThS) who was transferred from [facility] for UGI bleed management. Patient presented to [facility] 4/21 for one day history of dark diarrhea; while in the ED, patient was noted to have hematemesis as well. Hgb on arrival to ED was 7.3, plts 306.   Lines / Drains: CVC RIJ 4/22>> PIV R hand 4/21>> PIV R arm 4/21>>  Cultures: None  Antibiotics: None  Tests / Events: 4/21 - presented to [facility] ED with dark diarrhea; had hematemesis in ED; transfer to [facility]  SUBJECTIVE: Overnight, patient was tachycardic and hypotensive; repeat Hgb was 5.6; she had complaints of chest pain and had continued bloody movements per notes. This morning she denies bowel movements, denies ever having chest pain, denies current abdominal pain, shortness of breath. She takes ~30 secs answering orientation questions.  Past Medical History:  Diagnosis Date  . Anemia 02/11/2012  . Anxiety   . Atrial fibrillation (HCC)   . CHF (congestive heart failure) (HCC)   . Chronic kidney disease    not on dialysis yet, Tues, thurs, sat  . GERD (gastroesophageal reflux disease)   . H/O metabolic acidosis   . Hyperlipidemia   . Hyperparathyroidism, secondary (HCC)   . Hypertension    Dr. Regino Schultze, [place], [place]  . Oxygen dependent   . Vitamin D deficiency    Past Surgical History:  Procedure Laterality Date  . ABDOMINAL HYSTERECTOMY    . AV FISTULA PLACEMENT  04/01/2012   Procedure: ARTERIOVENOUS (AV) FISTULA CREATION;  Surgeon: Sherren Kerns, MD;  Location: [facility] OR;  Service: Vascular;  Laterality: Left;  . CHOLECYSTECTOMY    . COLON SURGERY     for a blockage  . EYE SURGERY     bilateral cataracts, /w IOL - 2013  . FISTULOGRAM N/A 11/05/2014   Procedure: FISTULOGRAM;  Surgeon: Larina Earthly, MD;  Location:  [facility] CATH LAB;  Service: Cardiovascular;  Laterality: N/A;  . INSERTION OF DIALYSIS CATHETER  04/10/2012   Procedure: INSERTION OF DIALYSIS CATHETER;  Surgeon: Pryor Ochoa, MD;  Location: [facility] OR;  Service: Vascular;  Laterality: N/A;  Insertion Diatek Catheter Right Internal Jugular  . LIGATION OF COMPETING BRANCHES OF ARTERIOVENOUS FISTULA  11/07/2012   Procedure: LIGATION OF COMPETING BRANCHES OF ARTERIOVENOUS FISTULA;  Surgeon: Chuck Hint, MD;  Location: [facility] OR;  Service: Vascular;  Laterality: Left;  Brachio/Cephalic Fistula  . SHUNTOGRAM N/A 11/03/2012   Procedure: Betsey Amen;  Surgeon: Chuck Hint, MD;  Location: [facility] CATH LAB;  Service: Cardiovascular;  Laterality: N/A;  . SHUNTOGRAM Left 03/09/2013   Procedure: Fistulogram;  Surgeon: Fransisco Hertz, MD;  Location: [facility] CATH LAB;  Service: Cardiovascular;  Laterality: Left;  . SHUNTOGRAM Left 11/23/2013   Procedure: FISTULOGRAM;  Surgeon: Fransisco Hertz, MD;  Location: [facility] CATH LAB;  Service: Cardiovascular;  Laterality: Left;   Prior to Admission medications   Medication Sig Start Date End Date Taking? Authorizing Provider  ALPRAZolam Prudy Feeler) 0.5 MG tablet Take 0.5 mg by mouth 3 (three) times daily as needed for anxiety.   Yes Historical Provider, MD  Biotin 1 MG CAPS Take 1 mg by mouth daily.   Yes Historical Provider, MD  diltiazem (CARDIZEM CD) 120 MG 24 hr capsule Take 2 tablets in the  am ( 240 mg)  And take 1 tablet (120 mg) at night 12/03/16  Yes Laqueta Linden, MD  ipratropium-albuterol (DUONEB) 0.5-2.5 (3) MG/3ML SOLN Take 3 mLs by nebulization every 6 (six) hours as needed. 11/06/16  Yes Kathlen Mody, MD  lidocaine-prilocaine (EMLA) cream Apply 1 application topically as needed (for IV access).  01/12/13  Yes Historical Provider, MD  metoprolol tartrate (LOPRESSOR) 25 MG tablet Take 1 tablet (25 mg total) by mouth 2 (two) times daily. 11/10/16  Yes Leroy Sea, MD  multivitamin (RENA-VIT) TABS tablet Take 1 tablet by  mouth daily.   Yes Historical Provider, MD  pentoxifylline (TRENTAL) 400 MG CR tablet Take 400 mg by mouth 3 (three) times daily with meals.   Yes Historical Provider, MD  promethazine (PHENERGAN) 25 MG tablet Take 25 mg by mouth every 6 (six) hours as needed for nausea or vomiting.   Yes Historical Provider, MD  sevelamer carbonate (RENVELA) 800 MG tablet Take 1,600 mg by mouth 3 (three) times daily with meals. Take 800mg  with snacks daily.    Yes Historical Provider, MD  simvastatin (ZOCOR) 10 MG tablet Take 10 mg by mouth daily.   Yes Historical Provider, MD  warfarin (COUMADIN) 5 MG tablet Take 1 1/2 tablets daily except 1 tablet on Tuesdays, Thursdays and Saturdays 12/24/16  Yes Laqueta Linden, MD   Allergies No Known Allergies  Family History Family History  Problem Relation Age of Onset  . Stroke Mother   . Cancer Father   . Heart attack Father     Social History  reports that she has never smoked. She has never used smokeless tobacco. She reports that she does not drink alcohol or use drugs.  Review Of Systems  11 points review of systems is negative with an exception of listed in HPI.  Vital Signs: Temp:  [97.6 F (36.4 C)-98.8 F (37.1 C)] 97.6 F (36.4 C) (04/22 0808) Pulse Rate:  [38-164] 128 (04/22 0743) Resp:  [12-32] 17 (04/22 0743) BP: (71-166)/(39-130) 166/86 (04/22 0743) SpO2:  [88 %-100 %] 100 % (04/22 0700) I/O last 3 completed shifts: In: 4382.8 [I.V.:315.8; Blood:2416.9; IV Piggyback:1650] Out: 150 [Stool:150]  Physical Examination: General:  Ill appearing elderly female patient Neuro:  Some confusion about overnight events and very delayed orientation answers   HEENT:  NGT in place Neck:  RIJ CVC in place   Cardiovascular:  Tachycardic, irregularly irregular rhythm; N S1/S2, no murmurs, rubs or gallops appreciated; no LE edema Lungs:  CTAB Abdomen:  Soft, NDNT Musculoskeletal:  Able to move all extremities Skin:  intact  Ventilator settings:     Labs and Imaging:  Reviewed.  Please refer to the Assessment and Plan section for relevant results.  Assessment and Plan:  PULMONARY  ASSESSMENT: No acute issues PLAN:   Continue monitoring May need ett for scope  CARDIOVASCULAR  ASSESSMENT: Tachycardia - improving Hypotension resolved Chest pain overnight - troponin 0.03 EKG  PLAN:  cardizem Blood given  RENAL  ASSESSMENT:  ESRD PLAN:   Will need hd today  GASTROINTESTINAL  ASSESSMENT:  GI bleed, upper PLAN:   egd ppi  HEMATOLOGIC  ASSESSMENT:  Anemia, GI bleed PLAN:  hct q4h prbc coags back up , repeat vit K   INFECTIOUS  ASSESSMENT:  No infection noted PLAN:   follow  ENDOCRINE  ASSESSMENT: mild hyperglcyemia PLAN:   cbg  NEUROLOGIC  ASSESSMENT:  enceph PLAN:   Correct hd, hct  Gorica Svalina 02/17/2017, 8:12 AM  STAFF NOTE: I, Rory Percy, MD FACP have personally reviewed patient's available data, including medical history, events of note, physical examination and test results as part of my evaluation. I have discussed with resident/NP and other care providers such as pharmacist, RN and RRT. In addition, I personally evaluated patient and elicited key findings of: awakesn, follows commands, no distress, crackles chest bilateral, JVD up, 4 liters up, BRB from NGT noted, to suction, GI bleed likely upper insetting coumadin recent for fib, cvp 13, woulod NOT provide any more fluids except prbc when indicated, needs HD today, for egd, may need ett per GI for EGD, will provide if requested, 2 units then hct q4h, inr back up, vit K x 1 again, cardizem drip, if drops BP, then add neo, ppi drip, I updated family and pt in room The patient is critically ill with multiple organ systems failure and requires high complexity decision making for assessment and support, frequent evaluation and titration of therapies, application of advanced monitoring technologies and extensive interpretation of  multiple databases.   Critical Care Time devoted to patient care services described in this note is 35 Minutes. This time reflects time of care of this signee: Rory Percy, MD FACP. This critical care time does not reflect procedure time, or teaching time or supervisory time of PA/NP/Med student/Med Resident etc but could involve care discussion time. Rest per NP/medical resident whose note is outlined above and that I agree with   Mcarthur Rossetti. Tyson Alias, MD, FACP Pgr: [phone] [place] [facility] 02/17/2017 8:50 AM

## 2017-02-17 NOTE — Progress Notes (Signed)
Hemoglobin 5.6 pltc 140 pt 29.2  and INR 2.70 called to Dr Arsenio Loader, will transfuse prbc and ffp units.

## 2017-02-17 NOTE — Procedures (Signed)
Intubation Procedure Note LASHEENA FRIEZE [id number] [birth year]  Procedure: Intubation Indications: prior to egd intervention, requested   Procedure Details Consent: Risks of procedure as well as the alternatives and risks of each were explained to the (patient/caregiver).  Consent for procedure obtained. Time Out: Verified patient identification, verified procedure, site/side was marked, verified correct patient position, special equipment/implants available, medications/allergies/relevent history reviewed, required imaging and test results available.  Performed  Maximum sterile technique was used including cap, gloves, gown and hand hygiene.  MAC and 3    Evaluation Hemodynamic Status: BP stable throughout; O2 sats: stable throughout Patient's Current Condition: stable Complications: No apparent complications Patient did tolerate procedure well. Chest X-ray ordered to verify placement.  CXR: pending.   Raylene Miyamoto 02/17/2017

## 2017-02-17 NOTE — Progress Notes (Signed)
Patient is intubated on ventilator support undergoing gi bleeding work up under critical care team. D/w PCCM Dr. Tyson Alias. hospitalist will sign off -patient remains critically ill Patricia Ayala N

## 2017-02-17 NOTE — Op Note (Signed)
Procedure:   EGD with control of bleeding  Meds:   Versed 3 mg, then propofol and fentanyl per ICU team after intubation Procedure was attempted with conscious sedation.  Full EGD done, but large amount of blood in stomach and no immediate source found.  Patient was tolerating procedure poorly, so aborted and ICU team intubated patient electively.  This was the contingency plan pre-procedure.  Indication:  UGI bleed  Findings:   Esophagus: Normal   Stomach:  Large amount of blood and clots in stomach.  About 50% of it cleared with countless passes of scope using Roth net and suction.  A chronic-appearing, 26mm ulcer with visible vessel seen on posterior wall of gastric body.   Submucosal epinephrine injected, total of 14.5 cc in several injections throughout procedure. Clip applied under its base. 2nd clip to close ulcer failed, and the ulcer began to bleed.  Bipolar cautery applied with good effect.  Residual oozing then stopped with epi injection around ulcer.   Duodenum:  Blood from stomach, but cleared  And no bleeding source found.   Impression:  Bleeding gastric ulcer.  Appears to be the source, but much of the gastric body along the greater curve could not be seen due to retained blood and clots.  Recommendations:  Check Q 6 hr CBC and INR x 24 hrs.  If Hgb continues to drop and INR elevated, give more FFP and Vit K.  Continue protonix drip at 8 mg/hr  Reglan 5 mg IV Q 6 hrs x 24 hrs   I think it would be best to keep her intubated and sedated at least until tomorrow AM when we can see if she is still bleeding or not.  If unclear, she may need repeat EGD.  I will be on duty until 5pm, then Dr Christella Hartigan until 8am, then Dr Leone Payor is on.   Carloyn Manner GI Pager [phone]

## 2017-02-18 ENCOUNTER — Encounter: Payer: Self-pay | Admitting: *Deleted

## 2017-02-18 ENCOUNTER — Inpatient Hospital Stay (HOSPITAL_COMMUNITY): Payer: Medicare Other

## 2017-02-18 LAB — BPAM FFP
BLOOD PRODUCT EXPIRATION DATE: 201804272359
Blood Product Expiration Date: 201804272359
Blood Product Expiration Date: 201804272359
ISSUE DATE / TIME: 201804220612
ISSUE DATE / TIME: 201804220612
ISSUE DATE / TIME: 201804220613
UNIT TYPE AND RH: 6200
Unit Type and Rh: 600
Unit Type and Rh: 6200

## 2017-02-18 LAB — BASIC METABOLIC PANEL
ANION GAP: 12 (ref 5–15)
BUN: 47 mg/dL — ABNORMAL HIGH (ref 6–20)
CHLORIDE: 100 mmol/L — AB (ref 101–111)
CO2: 25 mmol/L (ref 22–32)
Calcium: 8.8 mg/dL — ABNORMAL LOW (ref 8.9–10.3)
Creatinine, Ser: 3.06 mg/dL — ABNORMAL HIGH (ref 0.44–1.00)
GFR calc Af Amer: 15 mL/min — ABNORMAL LOW (ref >60)
GFR calc non Af Amer: 13 mL/min — ABNORMAL LOW (ref >60)
Glucose, Bld: 107 mg/dL — ABNORMAL HIGH (ref 65–99)
POTASSIUM: 2.9 mmol/L — AB (ref 3.5–5.1)
SODIUM: 137 mmol/L (ref 135–145)

## 2017-02-18 LAB — PREPARE FRESH FROZEN PLASMA
UNIT DIVISION: 0
Unit division: 0
Unit division: 0

## 2017-02-18 LAB — HEMOGLOBIN AND HEMATOCRIT, BLOOD
HCT: 24.7 % — ABNORMAL LOW (ref 36.0–46.0)
HCT: 24.7 % — ABNORMAL LOW (ref 36.0–46.0)
HEMATOCRIT: 26.1 % — AB (ref 36.0–46.0)
Hemoglobin: 8.8 g/dL — ABNORMAL LOW (ref 12.0–15.0)
Hemoglobin: 9 g/dL — ABNORMAL LOW (ref 12.0–15.0)
Hemoglobin: 8.6 g/dL — ABNORMAL LOW (ref 12.0–15.0)

## 2017-02-18 LAB — PROTIME-INR
INR: 1.12
INR: 1.31
PROTHROMBIN TIME: 14.4 s (ref 11.4–15.2)
Prothrombin Time: 16.4 seconds — ABNORMAL HIGH (ref 11.4–15.2)

## 2017-02-18 LAB — MAGNESIUM: MAGNESIUM: 1.7 mg/dL (ref 1.7–2.4)

## 2017-02-18 LAB — PHOSPHORUS: PHOSPHORUS: 2.2 mg/dL — AB (ref 2.5–4.6)

## 2017-02-18 MED ORDER — ALTEPLASE 2 MG IJ SOLR
2.0000 mg | Freq: Once | INTRAMUSCULAR | Status: DC | PRN
  Filled 2017-02-18: qty 2

## 2017-02-18 MED ORDER — METHYLPREDNISOLONE SODIUM SUCC 40 MG IJ SOLR
40.0000 mg | Freq: Three times a day (TID) | INTRAMUSCULAR | Status: AC
  Administered 2017-02-18 – 2017-02-20 (×5): 40 mg via INTRAVENOUS
  Filled 2017-02-18 (×5): qty 1

## 2017-02-18 MED ORDER — PENTAFLUOROPROP-TETRAFLUOROETH EX AERO: 1.0000 "application " | INHALATION_SPRAY | CUTANEOUS | Status: DC | PRN

## 2017-02-18 MED ORDER — LIDOCAINE-PRILOCAINE 2.5-2.5 % EX CREA
1.0000 "application " | TOPICAL_CREAM | CUTANEOUS | Status: DC | PRN
  Filled 2017-02-18: qty 5

## 2017-02-18 MED ORDER — SODIUM CHLORIDE 0.9 % IV SOLN: 100.0000 mL | INTRAVENOUS | Status: DC | PRN

## 2017-02-18 MED ORDER — DEXAMETHASONE SODIUM PHOSPHATE 10 MG/ML IJ SOLN
5.0000 mg | Freq: Three times a day (TID) | INTRAMUSCULAR | Status: DC
  Filled 2017-02-18: qty 0.5

## 2017-02-18 MED ORDER — METOPROLOL TARTRATE 5 MG/5ML IV SOLN
5.0000 mg | Freq: Once | INTRAVENOUS | Status: AC
  Administered 2017-02-18: 5 mg via INTRAVENOUS

## 2017-02-18 MED ORDER — LIDOCAINE HCL (PF) 1 % IJ SOLN
5.0000 mL | INTRAMUSCULAR | Status: DC | PRN
  Filled 2017-02-18: qty 5

## 2017-02-18 MED ORDER — HEPARIN SODIUM (PORCINE) 1000 UNIT/ML DIALYSIS: 1000.0000 [IU] | INTRAMUSCULAR | Status: DC | PRN

## 2017-02-18 MED ORDER — METOPROLOL TARTRATE 5 MG/5ML IV SOLN
INTRAVENOUS | Status: AC
  Administered 2017-02-18: 5 mg
  Filled 2017-02-18: qty 5

## 2017-02-18 NOTE — Progress Notes (Signed)
Called to inform Dr Roanna Epley pt potassium level 2.9, no new orders received from md.

## 2017-02-18 NOTE — Progress Notes (Signed)
Dialysis treatment completed.  600 mL ultrafiltrated and net fluid removal 0 mL.    Patient status unchaged. Lung sounds diminished to ausculation in all fields. Generalized non pitting edema. Cardiac: Afib, RVR.  Disconnected lines and removed needles.  Pressure held for 10 minutes and band aid/gauze dressing applied.  Report given to bedside RN, Zella Ball.

## 2017-02-18 NOTE — Progress Notes (Signed)
Daily Rounding Note  02/18/2017, 10:34 AM  LOS: 2 days   SUBJECTIVE:   Chief complaint: none.  A couple of black/tarry stools overnight.  Pt denies N/V.  Scant amount of red blood in subglottic suction tubing.  Weaning from vent now, not yet extubated.       OBJECTIVE:         Vital signs in last 24 hours:    Temp:  [97.7 F (36.5 C)-98.4 F (36.9 C)] 97.9 F (36.6 C) (04/23 0822) Pulse Rate:  [27-149] 92 (04/23 0831) Resp:  [5-26] 15 (04/23 0831) BP: (85-172)/(50-115) 121/73 (04/23 0831) SpO2:  [100 %] 100 % (04/23 0831) FiO2 (%):  [40 %-50 %] 40 % (04/23 0955) Weight:  [82.2 kg (181 lb 3.5 oz)-87.4 kg (192 lb 10.9 oz)] 82.2 kg (181 lb 3.5 oz) (04/23 0530) Last BM Date: 02/17/17 Filed Weights   02/18/17 0015 02/18/17 0423 02/18/17 0530  Weight: 87.4 kg (192 lb 10.9 oz) 87.4 kg (192 lb 10.9 oz) 82.2 kg (181 lb 3.5 oz)   General: intubated, alert   Heart: irreg, irreg Chest: clear bil.  In front Abdomen: soft, NT, ND.  Active BS  Extremities: no CCE Neuro/Psych:  Follows commands.  "Mittens" in place.    Intake/Output from previous day: 04/22 0701 - 04/23 0700 In: 1747.5 [I.V.:1032.4; Blood:665.1; IV Piggyback:50] Out: 0   Intake/Output this shift: No intake/output data recorded.  Lab Results:  Recent Labs  02/16/17 0741  02/17/17 0430 02/17/17 1430 02/17/17 1637 02/17/17 2317 02/18/17 0459  WBC 11.3*  --  11.0* 16.1*  --   --   --   HGB 7.3*  < > 5.6* 9.5* 9.2* 8.8* 9.0*  HCT 23.2*  < > 16.6* 27.7* 26.4* 24.7* 26.1*  PLT 306  --  140* 138*  --   --   --   < > = values in this interval not displayed. BMET  Recent Labs  02/17/17 1430 02/17/17 2023 02/18/17 0459  NA 138 138 137  K 4.1 5.2* 2.9*  CL 112* 107 100*  CO2 14* 17* 25  GLUCOSE 166* 107* 107*  BUN 127* 150* 47*  CREATININE 6.44* 7.84* 3.06*  CALCIUM 7.6* 9.2 8.8*   LFT  Recent Labs  02/16/17 0741  PROT 5.2*    ALBUMIN 2.4*  AST 13*  ALT 13*  ALKPHOS 37*  BILITOT 1.6*   PT/INR  Recent Labs  02/17/17 2340 02/18/17 0500  LABPROT 16.4* 14.4  INR 1.31 1.12      ASSESMENT:   *  UGIB with hematemesis, black stools.  Pt taking ASA, Aleve at home.  EGD 4/22 with clipping and cautery of non-bleeding GU with VV but blood in stomach.    *  A fib.  Chronic Coumadin initiated during 11/01/16 admission.   INR apparently >4 within 2 days of admission.  s/p FFP x 6.  s/p Vitamin K 10 mg SQ x 1 and 5 mg IV x 1.     *  ABL anemia.  Hgb stable s/p PRBC x 5 Thrombocytopenia, non-critical.    *  ESRD, TTS dialysis.    *  Hypokalemia.    *  Mild bump Troponins, 0.04, 0.03, likely demand ischemia in setting of anemia.     PLAN   *  Protonix drip through 4/24 at 11:45.  Then switch to BID.    *  Hgb/crit this afternoon,  CBC in AM. INR in AM.    *  Once extubated, ok to have clears if meets CCM criteria to begin PO.        Jennye Moccasin  02/18/2017, 10:34 AM Pager: [phone]     [facility] Attending   I have taken an interval history, reviewed the chart and examined the patient. I agree with the Advanced Practitioner's note, impression and recommendations.   She is still critically ill overall but GI bleeding is better.  Iva Boop, MD, [facility] [phone] (pager) [phone] after 5 PM, weekends and holidays  02/18/2017 9:33 PM

## 2017-02-18 NOTE — Progress Notes (Addendum)
PULMONARY  / CRITICAL CARE MEDICINE  Name: Patricia Ayala: [id number] DOB: 07-17-1929    LOS: 2  REFERRING MD :  Filbert Schilder, MD - [facility]  CHIEF COMPLAINT:  Melena, hematemesis  Brief patient description: Ms. Patricia Ayala is an 83yo female with PMH of A fib (recently started on Coumadin ~6weeks ago), CHF, HTN, HLD, ESRD on HD (TThS) who was transferred from [facility] for UGI bleed management. Patient presented to [facility] 4/21 for one day history of dark diarrhea; while in the ED, patient was noted to have hematemesis as well. Hgb on arrival to ED was 7.3, plts 306.   Lines / Drains: CVC RIJ 4/22>> PIV R hand 4/21>> PIV R arm 4/21>>  Cultures: None  Antibiotics: None  Tests / Events: 4/21 - presented to [facility] ED with dark diarrhea; had hematemesis in ED; transfer to [facility] 4/22 - EGD with large clot in gastric body; found one ulcer with exposed vessel s/p clip and epinephrine  SUBJECTIVE: Patient sedated and unable to give ROS; per nursing, had 2 melenic stools overnight. She had HD without volume removal early this AM.  PAST MEDICAL HISTORY :  Past Medical History:  Diagnosis Date  . Anemia 02/11/2012  . Anxiety   . Atrial fibrillation (HCC)   . CHF (congestive heart failure) (HCC)   . Chronic kidney disease    not on dialysis yet, Tues, thurs, sat  . GERD (gastroesophageal reflux disease)   . H/O metabolic acidosis   . Hyperlipidemia   . Hyperparathyroidism, secondary (HCC)   . Hypertension    Dr. Regino Schultze, [place], [place]  . Oxygen dependent   . Vitamin D deficiency    Past Surgical History:  Procedure Laterality Date  . ABDOMINAL HYSTERECTOMY    . AV FISTULA PLACEMENT  04/01/2012   Procedure: ARTERIOVENOUS (AV) FISTULA CREATION;  Surgeon: Sherren Kerns, MD;  Location: [facility] OR;  Service: Vascular;  Laterality: Left;  . CHOLECYSTECTOMY    . COLON SURGERY     for a blockage  . EYE SURGERY     bilateral cataracts, /w IOL - 2013  . FISTULOGRAM N/A 11/05/2014   Procedure:  FISTULOGRAM;  Surgeon: Larina Earthly, MD;  Location: [facility] CATH LAB;  Service: Cardiovascular;  Laterality: N/A;  . INSERTION OF DIALYSIS CATHETER  04/10/2012   Procedure: INSERTION OF DIALYSIS CATHETER;  Surgeon: Pryor Ochoa, MD;  Location: [facility] OR;  Service: Vascular;  Laterality: N/A;  Insertion Diatek Catheter Right Internal Jugular  . LIGATION OF COMPETING BRANCHES OF ARTERIOVENOUS FISTULA  11/07/2012   Procedure: LIGATION OF COMPETING BRANCHES OF ARTERIOVENOUS FISTULA;  Surgeon: Chuck Hint, MD;  Location: [facility] OR;  Service: Vascular;  Laterality: Left;  Brachio/Cephalic Fistula  . SHUNTOGRAM N/A 11/03/2012   Procedure: Betsey Amen;  Surgeon: Chuck Hint, MD;  Location: [facility] CATH LAB;  Service: Cardiovascular;  Laterality: N/A;  . SHUNTOGRAM Left 03/09/2013   Procedure: Fistulogram;  Surgeon: Fransisco Hertz, MD;  Location: [facility] CATH LAB;  Service: Cardiovascular;  Laterality: Left;  . SHUNTOGRAM Left 11/23/2013   Procedure: FISTULOGRAM;  Surgeon: Fransisco Hertz, MD;  Location: [facility] CATH LAB;  Service: Cardiovascular;  Laterality: Left;   Prior to Admission medications   Medication Sig Start Date End Date Taking? Authorizing Provider  ALPRAZolam Prudy Feeler) 0.5 MG tablet Take 0.5 mg by mouth 3 (three) times daily as needed for anxiety.   Yes Historical Provider, MD  Biotin 1 MG CAPS Take 1 mg by mouth daily.   Yes  Historical Provider, MD  diltiazem (CARDIZEM CD) 120 MG 24 hr capsule Take 2 tablets in the am ( 240 mg)  And take 1 tablet (120 mg) at night 12/03/16  Yes Laqueta Linden, MD  ipratropium-albuterol (DUONEB) 0.5-2.5 (3) MG/3ML SOLN Take 3 mLs by nebulization every 6 (six) hours as needed. 11/06/16  Yes Kathlen Mody, MD  lidocaine-prilocaine (EMLA) cream Apply 1 application topically as needed (for IV access).  01/12/13  Yes Historical Provider, MD  metoprolol tartrate (LOPRESSOR) 25 MG tablet Take 1 tablet (25 mg total) by mouth 2 (two) times daily. 11/10/16  Yes Leroy Sea, MD   multivitamin (RENA-VIT) TABS tablet Take 1 tablet by mouth daily.   Yes Historical Provider, MD  pentoxifylline (TRENTAL) 400 MG CR tablet Take 400 mg by mouth 3 (three) times daily with meals.   Yes Historical Provider, MD  promethazine (PHENERGAN) 25 MG tablet Take 25 mg by mouth every 6 (six) hours as needed for nausea or vomiting.   Yes Historical Provider, MD  sevelamer carbonate (RENVELA) 800 MG tablet Take 1,600 mg by mouth 3 (three) times daily with meals. Take 800mg  with snacks daily.    Yes Historical Provider, MD  simvastatin (ZOCOR) 10 MG tablet Take 10 mg by mouth daily.   Yes Historical Provider, MD  warfarin (COUMADIN) 5 MG tablet Take 1 1/2 tablets daily except 1 tablet on Tuesdays, Thursdays and Saturdays 12/24/16  Yes Laqueta Linden, MD   No Known Allergies  FAMILY HISTORY:  Family History  Problem Relation Age of Onset  . Stroke Mother   . Cancer Father   . Heart attack Father    SOCIAL HISTORY:  reports that she has never smoked. She has never used smokeless tobacco. She reports that she does not drink alcohol or use drugs.   INTERVAL HISTORY:   VITAL SIGNS: Temp:  [97.5 F (36.4 C)-98.4 F (36.9 C)] 98 F (36.7 C) (04/23 0342) Pulse Rate:  [27-153] 92 (04/23 0426) Resp:  [6-34] 17 (04/23 0426) BP: (85-181)/(23-143) 146/87 (04/23 0426) SpO2:  [96 %-100 %] 100 % (04/23 0426) FiO2 (%):  [40 %-100 %] 40 % (04/23 0400) Weight:  [82.2 kg (181 lb 3.5 oz)-87.4 kg (192 lb 10.9 oz)] 82.2 kg (181 lb 3.5 oz) (04/23 0530) HEMODYNAMICS:   VENTILATOR SETTINGS: Vent Mode: PRVC FiO2 (%):  [40 %-100 %] 40 % Set Rate:  [16 bmp] 16 bmp Vt Set:  [500 mL] 500 mL PEEP:  [5 cmH20] 5 cmH20 Plateau Pressure:  [15 cmH20-22 cmH20] 19 cmH20 INTAKE / OUTPUT: Intake/Output      04/22 0701 - 04/23 0700 04/23 0701 - 04/24 0700   I.V. (mL/kg) 1032.4 (12.6)    Blood 665.1    IV Piggyback 50    Total Intake(mL/kg) 1747.5 (21.3)    Emesis/NG output     Other 0    Stool      Total Output 0     Net +1747.5            PHYSICAL EXAMINATION: General:  Ill appearing elderly female patient Neuro:  Sedated, Opens eyes to command but otherwise not following commands, PERRL   HEENT:  ETT/OGT in place Neck:  RIJ CVC in place   Cardiovascular:  Tachycardic, irregularly irregular rhythm; N S1/S2, no murmurs, rubs or gallops appreciated; no LE edema Lungs:  CTAB Abdomen:  Soft, NDNT Musculoskeletal:  Not able to move extremities to command Skin:  intact   LABS: Cbc  Recent Labs Lab  02/16/17 0741  02/17/17 0430 02/17/17 1430 02/17/17 1637 02/17/17 2317 02/18/17 0459  WBC 11.3*  --  11.0* 16.1*  --   --   --   HGB 7.3*  < > 5.6* 9.5* 9.2* 8.8* 9.0*  HCT 23.2*  < > 16.6* 27.7* 26.4* 24.7* 26.1*  PLT 306  --  140* 138*  --   --   --   < > = values in this interval not displayed.  Chemistry   Recent Labs Lab 02/17/17 1333 02/17/17 1430 02/17/17 2023 02/18/17 0459  NA  --  138 138 137  K  --  4.1 5.2* 2.9*  CL  --  112* 107 100*  CO2  --  14* 17* 25  BUN  --  127* 150* 47*  CREATININE  --  6.44* 7.84* 3.06*  CALCIUM  --  7.6* 9.2 8.8*  MG 1.5*  --   --  1.7  PHOS 5.1*  --   --  2.2*  GLUCOSE  --  166* 107* 107*    Liver fxn  Recent Labs Lab 02/16/17 0741  AST 13*  ALT 13*  ALKPHOS 37*  BILITOT 1.6*  PROT 5.2*  ALBUMIN 2.4*   coags  Recent Labs Lab 02/17/17 1827 02/17/17 2340 02/18/17 0500  INR 1.47 1.31 1.12   Sepsis markers No results for input(s): LATICACIDVEN, PROCALCITON in the last 168 hours. Cardiac markers  Recent Labs Lab 02/17/17 0430 02/17/17 1030 02/17/17 1430  TROPONINI 0.03* 0.04* 0.03*   BNP No results for input(s): PROBNP in the last 168 hours. ABG  Recent Labs Lab 02/17/17 1257  PHART 7.274*  PCO2ART 39.6  PO2ART 394.0*  HCO3 18.5*  TCO2 20    CBG trend  Recent Labs Lab 02/16/17 1621 02/16/17 1935 02/17/17 0024 02/17/17 0417  GLUCAP 94 87 105* 148*     IMAGING:  ECG:  DIAGNOSES: Principal Problem:   GIB (gastrointestinal bleeding) Active Problems:   Anemia   ESRD (end stage renal disease) (HCC)   Benign essential HTN   GERD (gastroesophageal reflux disease)   Acute on chronic diastolic CHF (congestive heart failure) (HCC)   Atrial fibrillation with RVR (HCC)   Melena   Hematemesis   Acute blood loss anemia   Chronic anticoagulation   Chronic gastric ulcer with hemorrhage   Acute post-traumatic headache, not intractable   ASSESSMENT / PLAN:  PULMONARY  ASSESSMENT: Intubated 4/22 after finding of significant blood clot in gastric body PLAN:   Continue monitoring for continued UGI bleeding; may extubate when no further planned procedures - maybe tomorrow if stable   CARDIOVASCULAR  ASSESSMENT:  H/H improved; INR normalized this AM  A fib with RVR - rates in low 100s on cardizem gtt PLAN:  Cardizem gtt PRBCs and FFPs given  RENAL  ASSESSMENT:  ESRD, HD TThS PLAN:   HD yesterday  GASTROINTESTINAL  ASSESSMENT:  GI bleed, upper - found one ulcer with exposed vessel on EGD s/p clip and Epi H/H stable this AM; INR reversed s/p 5 units pRBCs and 6 units FFPs PLAN:   Continue PPI gtt Serial H/Hs and INR - if concern for re-bleeding will repeat EGD  HEMATOLOGIC  ASSESSMENT:  Anemia, GI bleed H/H stable this AM; INR reversed s/p 5 units pRBCs and 6 units FFPs PLAN:  hct q6h, INR q6h  INFECTIOUS  ASSESSMENT:  No infection noted PLAN:   Follow  ENDOCRINE  ASSESSMENT: mild hyperglcyemia PLAN:   cbg  NEUROLOGIC  ASSESSMENT: encephalopathy likely from  blood loss PLAN:   Sedated on propofol at time of eval; now off  I have personally obtained a history, examined the patient, evaluated laboratory and imaging results, formulated the assessment and plan and placed orders. CRITICAL CARE: The patient is critically ill with multiple organ systems failure and requires high complexity  decision making for assessment and support, frequent evaluation and titration of therapies, application of advanced monitoring technologies and extensive interpretation of multiple databases. Critical Care Time devoted to patient care services described in this note is 33 minutes.   Nyra Market, MD PCCM - PGY1 Pulmonary and Critical Care Medicine [facility] Pager: [phone]  02/18/2017, 7:27 AM

## 2017-02-18 NOTE — Progress Notes (Signed)
Arrived to patient room 46M-14 at 0015.  Reviewed treatment plan and this RN agrees.  Report received from bedside RN, Zella Ball.    Patient Sedated, intubated. Lung sounds diminished to ausculation in all fields. Generalized non pitting edema. Cardiac: Afib.  Prepped LUAVF with alcohol and cannulated with two 15 gauge needles.  Pulsation of blood noted.  Flushed access well with saline per protocol.  Connected and secured lines and initiated tx at 0053.  UF goal of 2000 mL and net fluid removal of 1500 mL.  Will continue to monitor.

## 2017-02-18 NOTE — Progress Notes (Signed)
Patient ID: Patricia MILKS, female   DOB: Feb 26, 1929, 83 y.o.   MRN: [id number] S: ESRD patient of Dr. Lowanda Foster admitted 02/16/17 with upper GI bleed and profound anemia (hgb drop to 5.6) complicated by hypovolemic shock, a fib with RVR, s/p EGD 02/17/17 with significant ulcer noted and treated, she was intubated pre-procedure.  She has received multiple units of PRBC and FFP.  Of note, she was recently started on coumadin about 6 weeks ago fo A fib. O:BP 121/73   Pulse 92   Temp 97.9 F (36.6 C) (Oral)   Resp 15   Ht '5\' 4"'  (1.626 m)   Wt 82.2 kg (181 lb 3.5 oz)   SpO2 100%   BMI 31.11 kg/m   Intake/Output Summary (Last 24 hours) at 02/18/17 0843 Last data filed at 02/18/17 0600  Gross per 24 hour  Intake            847.4 ml  Output                0 ml  Net            847.4 ml   Intake/Output: I/O last 3 completed shifts: In: 4776.3 [I.V.:1247.4; Blood:2178.9; IV [id number]:6701] Out: 150 [Emesis/NG output:150]  Intake/Output this shift:  No intake/output data recorded. Weight change: 5.32 kg (11 lb 11.7 oz) Gen:WD AAF intubated and sedated CVS:no rub Resp:cta Abd: +BS, soft Ext:no edema, LUE AVG +T/B   Recent Labs Lab 02/16/17 0741 02/17/17 0430 02/17/17 1333 02/17/17 1430 02/17/17 2023 02/18/17 0459  NA 137 139  --  138 138 137  K 4.4 5.3*  --  4.1 5.2* 2.9*  CL 102 106  --  112* 107 100*  CO2 21* 17*  --  14* 17* 25  GLUCOSE 129* 173*  --  166* 107* 107*  BUN 120* 145*  --  127* 150* 47*  CREATININE 6.70* 7.72*  --  6.44* 7.84* 3.06*  ALBUMIN 2.4*  --   --   --   --   --   CALCIUM 8.4* 8.8*  --  7.6* 9.2 8.8*  PHOS  --   --  5.1*  --   --  2.2*  AST 13*  --   --   --   --   --   ALT 13*  --   --   --   --   --    Liver Function Tests:  Recent Labs Lab 02/16/17 0741  AST 13*  ALT 13*  ALKPHOS 37*  BILITOT 1.6*  PROT 5.2*  ALBUMIN 2.4*    Recent Labs Lab 02/16/17 0741  LIPASE 39   No results for input(s): AMMONIA in the last 168  hours. CBC:  Recent Labs Lab 02/16/17 0741  02/17/17 0430 02/17/17 1430 02/17/17 1637 02/17/17 2317 02/18/17 0459  WBC 11.3*  --  11.0* 16.1*  --   --   --   NEUTROABS 9.3*  --   --   --   --   --   --   HGB 7.3*  < > 5.6* 9.5* 9.2* 8.8* 9.0*  HCT 23.2*  < > 16.6* 27.7* 26.4* 24.7* 26.1*  MCV 94.7  --  87.4 82.7  --   --   --   PLT 306  --  140* 138*  --   --   --   < > = values in this interval not displayed. Cardiac Enzymes:  Recent Labs Lab 02/17/17 0430 02/17/17 1030 02/17/17 1430  TROPONINI 0.03* 0.04* 0.03*   CBG:  Recent Labs Lab 02/16/17 1621 02/16/17 1935 02/17/17 0024 02/17/17 0417  GLUCAP 94 87 105* 148*    Iron Studies: No results for input(s): IRON, TIBC, TRANSFERRIN, FERRITIN in the last 72 hours. Studies/Results: Ct Head Wo Contrast  Result Date: 02/16/2017 CLINICAL DATA:  Headache since a fall 2 weeks ago. The patient is on Coumadin. EXAM: CT HEAD WITHOUT CONTRAST TECHNIQUE: Contiguous axial images were obtained from the base of the skull through the vertex without intravenous contrast. COMPARISON:  None. FINDINGS: Brain: There is some cortical atrophy and chronic microvascular ischemic change. No evidence of acute abnormality including hemorrhage, infarct, mass lesion, mass effect, midline shift or abnormal extra-axial fluid collections identified. No hydrocephalus or pneumocephalus. Vascular: Atherosclerosis noted. Skull: Intact. Sinuses/Orbits: No acute abnormality. Other: None. IMPRESSION: No acute abnormality. Mild atrophy and chronic microvascular ischemic change. Electronically Signed   By: Inge Rise M.D.   On: 02/16/2017 09:07   Portable Chest Xray  Result Date: 02/18/2017 CLINICAL DATA:  Respiratory failure, shortness of breath. EXAM: PORTABLE CHEST 1 VIEW COMPARISON:  Radiograph of February 17, 2017. FINDINGS: Stable cardiomegaly. Atherosclerosis of thoracic aorta is noted. Endotracheal tube is in grossly good position. No pneumothorax is  noted. Right internal jugular catheter is unchanged. Mild bibasilar edema or atelectasis is noted with possible associated pleural effusions. Bony thorax is unremarkable. IMPRESSION: Aortic atherosclerosis. Stable support apparatus. Mild bibasilar atelectasis or edema with possible associated pleural effusions. Electronically Signed   By: Marijo Conception, M.D.   On: 02/18/2017 07:34   Dg Chest Port 1 View  Result Date: 02/17/2017 CLINICAL DATA:  Respiratory failure.  Status post intubation today. EXAM: PORTABLE CHEST 1 VIEW COMPARISON:  Single-view of the chest earlier today. FINDINGS: New endotracheal tube is in place with the tip projecting in good position at the level of the clavicular heads. Right IJ catheter is again seen. NG tube has been removed. Left basilar airspace disease is unchanged. There is cardiomegaly. No pneumothorax. IMPRESSION: Endotracheal tube projects in good position. No change in left basilar airspace disease. Cardiomegaly. Atherosclerosis. Electronically Signed   By: Inge Rise M.D.   On: 02/17/2017 11:31   Dg Chest Port 1 View  Result Date: 02/17/2017 CLINICAL DATA:  Central line placement EXAM: PORTABLE CHEST 1 VIEW COMPARISON:  02/16/2017 FINDINGS: The patient is rotated. Moderate-to-marked cardiomegaly as before. Hazy atelectasis or infiltrate at the left base. Suspect tiny pleural effusions. Atherosclerosis of the aorta. Esophageal tube tip is in the left upper quadrant. Right-sided central venous catheter tip overlies the cavoatrial junction. There is no pneumothorax. IMPRESSION: 1. Right-sided central venous catheter tip projects over the cavoatrial junction. No pneumothorax 2. Stable degree of cardiomegaly. Suspect tiny pleural effusions and hazy atelectasis or infiltrate at the left base. Electronically Signed   By: Donavan Foil M.D.   On: 02/17/2017 01:00   Dg Chest Port 1 View  Result Date: 02/16/2017 CLINICAL DATA:  83 year old female undergoing nasogastric  tube placement. EXAM: PORTABLE CHEST 1 VIEW COMPARISON:  Prior chest x-ray 11/08/2016 FINDINGS: Stable cardiomegaly. Mediastinal contours are unchanged. Atherosclerotic calcifications again noted in the transverse aorta. A gastric tube is present. The tip of the tube overlies the distal esophagus. No focal airspace consolidation, pleural effusion or pneumothorax. No suspicious nodule or mass. Stable mild vascular congestion and chronic bronchitic changes. No acute osseous abnormality. IMPRESSION: 1. Stable chest x-ray without evidence of acute cardiopulmonary process. 2. The tip of a gastric tube projects over the  distal esophagus. Recommend advancing 02-2009 cm for placement in the stomach. 3. Stable cardiomegaly. 4.  Aortic Atherosclerosis (ICD10-170.0) Electronically Signed   By: Jacqulynn Cadet M.D.   On: 02/16/2017 11:56   Dg Abd Portable 1v  Result Date: 02/16/2017 CLINICAL DATA:  Patient status post enteric tube placement. EXAM: PORTABLE ABDOMEN - 1 VIEW COMPARISON:  Chest radiograph 02/16/2017 FINDINGS: Enteric tube tip and side-port project over the left upper quadrant. Nonspecific nondilated loops of bowel within the central abdomen and right lower quadrant. Cholecystectomy clips. Thoracic spine lower lumbar spine degenerative changes. IMPRESSION: Enteric tube tip and side-port project over the stomach. Electronically Signed   By: Lovey Newcomer M.D.   On: 02/16/2017 16:01   . chlorhexidine gluconate (MEDLINE KIT)  15 mL Mouth Rinse BID  . mouth rinse  15 mL Mouth Rinse QID  . metoCLOPramide (REGLAN) injection  5 mg Intravenous Q6H  . [START ON 02/19/2017] pantoprazole  40 mg Intravenous Q12H  . sodium chloride flush  3 mL Intravenous Q12H    BMET    Component Value Date/Time   NA 137 02/18/2017 0459   K 2.9 (L) 02/18/2017 0459   CL 100 (L) 02/18/2017 0459   CO2 25 02/18/2017 0459   GLUCOSE 107 (H) 02/18/2017 0459   BUN 47 (H) 02/18/2017 0459   CREATININE 3.06 (H) 02/18/2017 0459    CALCIUM 8.8 (L) 02/18/2017 0459   GFRNONAA 13 (L) 02/18/2017 0459   GFRAA 15 (L) 02/18/2017 0459   CBC    Component Value Date/Time   WBC 16.1 (H) 02/17/2017 1430   RBC 3.35 (L) 02/17/2017 1430   HGB 9.0 (L) 02/18/2017 0459   HCT 26.1 (L) 02/18/2017 0459   PLT 138 (L) 02/17/2017 1430   MCV 82.7 02/17/2017 1430   MCH 28.4 02/17/2017 1430   MCHC 34.3 02/17/2017 1430   RDW 16.5 (H) 02/17/2017 1430   LYMPHSABS 1.4 02/16/2017 0741   MONOABS 0.6 02/16/2017 0741   EOSABS 0.0 02/16/2017 0741   BASOSABS 0.0 02/16/2017 0741   Normally TTS in [place] with Dr. Lowanda Foster, off schedule, had HD late last night/early this am.  Assessment/Plan:  1. ABLA due to gastric ulcer- s/p EGD with clipping/epi will follow H/H 2. ESRD- normally TTS but off schedule.  Plan for HD again tomorrow to get back on schedule.  No heparin 3. A fib s/p RVR- now rate controlled 4. VDRF- per PCCM 5. HTN- stable  6. COPD 7. Hypokalemia- following HD, do not replete as she will re-equilibrate later  Donetta Potts, MD [facility] [phone]

## 2017-02-19 LAB — CBC
HEMATOCRIT: 22.6 % — AB (ref 36.0–46.0)
HEMOGLOBIN: 7.7 g/dL — AB (ref 12.0–15.0)
MCH: 28.6 pg (ref 26.0–34.0)
MCHC: 34.1 g/dL (ref 30.0–36.0)
MCV: 84 fL (ref 78.0–100.0)
Platelets: 98 10*3/uL — ABNORMAL LOW (ref 150–400)
RBC: 2.69 MIL/uL — AB (ref 3.87–5.11)
RDW: 17.4 % — ABNORMAL HIGH (ref 11.5–15.5)
WBC: 7.7 10*3/uL (ref 4.0–10.5)

## 2017-02-19 LAB — HEPATITIS B SURFACE ANTIGEN: Hepatitis B Surface Ag: NEGATIVE

## 2017-02-19 LAB — HEPATITIS B SURFACE ANTIBODY,QUALITATIVE: HEP B S AB: REACTIVE

## 2017-02-19 LAB — RENAL FUNCTION PANEL
ALBUMIN: 2.2 g/dL — AB (ref 3.5–5.0)
ANION GAP: 13 (ref 5–15)
BUN: 76 mg/dL — ABNORMAL HIGH (ref 6–20)
CALCIUM: 8.8 mg/dL — AB (ref 8.9–10.3)
CO2: 23 mmol/L (ref 22–32)
Chloride: 101 mmol/L (ref 101–111)
Creatinine, Ser: 5.08 mg/dL — ABNORMAL HIGH (ref 0.44–1.00)
GFR, EST AFRICAN AMERICAN: 8 mL/min — AB (ref >60)
GFR, EST NON AFRICAN AMERICAN: 7 mL/min — AB (ref >60)
Glucose, Bld: 148 mg/dL — ABNORMAL HIGH (ref 65–99)
PHOSPHORUS: 6.1 mg/dL — AB (ref 2.5–4.6)
POTASSIUM: 4.6 mmol/L (ref 3.5–5.1)
SODIUM: 137 mmol/L (ref 135–145)

## 2017-02-19 LAB — HEPATITIS B CORE ANTIBODY, TOTAL: HEP B C TOTAL AB: NEGATIVE

## 2017-02-19 LAB — PROTIME-INR
INR: 1.19
Prothrombin Time: 15.2 seconds (ref 11.4–15.2)

## 2017-02-19 MED ORDER — PHENOL 1.4 % MT LIQD
1.0000 | OROMUCOSAL | Status: DC | PRN
  Filled 2017-02-19: qty 177

## 2017-02-19 NOTE — Procedures (Signed)
Extubation Procedure Note  Patient Details:   Name: Patricia Ayala DOB: 1929-05-10 MRN: [id number]   Airway Documentation:     Evaluation  O2 sats: stable throughout Complications: No apparent complications Patient did tolerate procedure well. Bilateral Breath Sounds: Clear, Diminished   Yes  Patient tolerated wean. MD ordered to extubate. Positive for cuff leak. Patient extubated to a 4 Lpm nasal cannula. No signs of dyspnea or stridor. Patient instructed on the usage of the Incentive Spirometer achieving 600 mL five times. RN at bedside.   Ancil Boozer 02/19/2017, 4:27 PM

## 2017-02-19 NOTE — Progress Notes (Signed)
PULMONARY  / CRITICAL CARE MEDICINE  Name: Patricia Ayala MRN: [id number] DOB: 1928-12-14    LOS: 3  REFERRING MD :  Filbert Schilder, MD - [facility]  CHIEF COMPLAINT:  Melena, hematemesis  Brief patient description: Patricia Ayala is an 83yo female with PMH of A fib (recently started on Coumadin ~6weeks ago), CHF, HTN, HLD, ESRD on HD (TThS) who was transferred from [facility] for UGI bleed management. Patient presented to [facility] 4/21 for one day history of dark diarrhea; while in the ED, patient was noted to have hematemesis as well. Hgb on arrival to ED was 7.3, plts 306.   Lines / Drains: CVC RIJ 4/22>> PIV R hand 4/21>> PIV R arm 4/21>>  Cultures: None  Antibiotics: None  Tests / Events: 4/21 - presented to [facility] ED with dark diarrhea; had hematemesis in ED; transfer to [facility] 4/22 - EGD with large clot in gastric body; found one ulcer with exposed vessel s/p clip and epinephrine  SUBJECTIVE: Patient alert this AM; denies abdominal pain, chest pain.   PAST MEDICAL HISTORY :  Past Medical History:  Diagnosis Date  . Anemia 02/11/2012  . Anxiety   . Atrial fibrillation (HCC)   . CHF (congestive heart failure) (HCC)   . Chronic kidney disease    not on dialysis yet, Tues, thurs, sat  . GERD (gastroesophageal reflux disease)   . H/O metabolic acidosis   . Hyperlipidemia   . Hyperparathyroidism, secondary (HCC)   . Hypertension    Dr. Regino Schultze, [place], [place]  . Oxygen dependent   . Vitamin D deficiency    Past Surgical History:  Procedure Laterality Date  . ABDOMINAL HYSTERECTOMY    . AV FISTULA PLACEMENT  04/01/2012   Procedure: ARTERIOVENOUS (AV) FISTULA CREATION;  Surgeon: Sherren Kerns, MD;  Location: [facility] OR;  Service: Vascular;  Laterality: Left;  . CHOLECYSTECTOMY    . COLON SURGERY     for a blockage  . EYE SURGERY     bilateral cataracts, /w IOL - 2013  . FISTULOGRAM N/A 11/05/2014   Procedure: FISTULOGRAM;  Surgeon: Larina Earthly, MD;  Location: [facility] CATH LAB;  Service:  Cardiovascular;  Laterality: N/A;  . INSERTION OF DIALYSIS CATHETER  04/10/2012   Procedure: INSERTION OF DIALYSIS CATHETER;  Surgeon: Pryor Ochoa, MD;  Location: [facility] OR;  Service: Vascular;  Laterality: N/A;  Insertion Diatek Catheter Right Internal Jugular  . LIGATION OF COMPETING BRANCHES OF ARTERIOVENOUS FISTULA  11/07/2012   Procedure: LIGATION OF COMPETING BRANCHES OF ARTERIOVENOUS FISTULA;  Surgeon: Chuck Hint, MD;  Location: [facility] OR;  Service: Vascular;  Laterality: Left;  Brachio/Cephalic Fistula  . SHUNTOGRAM N/A 11/03/2012   Procedure: Betsey Amen;  Surgeon: Chuck Hint, MD;  Location: [facility] CATH LAB;  Service: Cardiovascular;  Laterality: N/A;  . SHUNTOGRAM Left 03/09/2013   Procedure: Fistulogram;  Surgeon: Fransisco Hertz, MD;  Location: [facility] CATH LAB;  Service: Cardiovascular;  Laterality: Left;  . SHUNTOGRAM Left 11/23/2013   Procedure: FISTULOGRAM;  Surgeon: Fransisco Hertz, MD;  Location: [facility] CATH LAB;  Service: Cardiovascular;  Laterality: Left;   Prior to Admission medications   Medication Sig Start Date End Date Taking? Authorizing Provider  ALPRAZolam Prudy Feeler) 0.5 MG tablet Take 0.5 mg by mouth 3 (three) times daily as needed for anxiety.   Yes Historical Provider, MD  Biotin 1 MG CAPS Take 1 mg by mouth daily.   Yes Historical Provider, MD  diltiazem (CARDIZEM CD) 120 MG 24 hr capsule Take  2 tablets in the am ( 240 mg)  And take 1 tablet (120 mg) at night 12/03/16  Yes Laqueta Linden, MD  ipratropium-albuterol (DUONEB) 0.5-2.5 (3) MG/3ML SOLN Take 3 mLs by nebulization every 6 (six) hours as needed. 11/06/16  Yes Kathlen Mody, MD  lidocaine-prilocaine (EMLA) cream Apply 1 application topically as needed (for IV access).  01/12/13  Yes Historical Provider, MD  metoprolol tartrate (LOPRESSOR) 25 MG tablet Take 1 tablet (25 mg total) by mouth 2 (two) times daily. 11/10/16  Yes Leroy Sea, MD  multivitamin (RENA-VIT) TABS tablet Take 1 tablet by mouth daily.   Yes  Historical Provider, MD  pentoxifylline (TRENTAL) 400 MG CR tablet Take 400 mg by mouth 3 (three) times daily with meals.   Yes Historical Provider, MD  promethazine (PHENERGAN) 25 MG tablet Take 25 mg by mouth every 6 (six) hours as needed for nausea or vomiting.   Yes Historical Provider, MD  sevelamer carbonate (RENVELA) 800 MG tablet Take 1,600 mg by mouth 3 (three) times daily with meals. Take 800mg  with snacks daily.    Yes Historical Provider, MD  simvastatin (ZOCOR) 10 MG tablet Take 10 mg by mouth daily.   Yes Historical Provider, MD  warfarin (COUMADIN) 5 MG tablet Take 1 1/2 tablets daily except 1 tablet on Tuesdays, Thursdays and Saturdays 12/24/16  Yes Laqueta Linden, MD   No Known Allergies  FAMILY HISTORY:  Family History  Problem Relation Age of Onset  . Stroke Mother   . Cancer Father   . Heart attack Father    SOCIAL HISTORY:  reports that she has never smoked. She has never used smokeless tobacco. She reports that she does not drink alcohol or use drugs.   INTERVAL HISTORY:   VITAL SIGNS: Temp:  [97.9 F (36.6 C)-99.2 F (37.3 C)] 99 F (37.2 C) (04/24 0357) Pulse Rate:  [25-125] 95 (04/24 0600) Resp:  [12-20] 17 (04/24 0600) BP: (84-141)/(55-87) 91/66 (04/24 0600) SpO2:  [100 %] 100 % (04/24 0600) FiO2 (%):  [40 %] 40 % (04/24 0320) Weight:  [84.7 kg (186 lb 11.7 oz)] 84.7 kg (186 lb 11.7 oz) (04/24 0455) HEMODYNAMICS: CVP:  [3 mmHg-13 mmHg] 10 mmHg VENTILATOR SETTINGS: Vent Mode: PRVC FiO2 (%):  [40 %] 40 % Set Rate:  [16 bmp] 16 bmp Vt Set:  [500 mL] 500 mL PEEP:  [5 cmH20] 5 cmH20 Pressure Support:  [8 cmH20] 8 cmH20 Plateau Pressure:  [19 cmH20-21 cmH20] 20 cmH20 INTAKE / OUTPUT: Intake/Output      04/23 0701 - 04/24 0700 04/24 0701 - 04/25 0700   I.V. (mL/kg) 1118.4 (13.2)    Blood     IV Piggyback     Total Intake(mL/kg) 1118.4 (13.2)    Other     Total Output       Net +1118.4          Stool Occurrence 2 x      PHYSICAL  EXAMINATION: General:  Ill appearing elderly female patient Neuro:  Alert, following commands, PERRL   HEENT:  ETT in place Neck:  RIJ CVC in place   Cardiovascular:  Tachycardic, irregularly irregular rhythm; N S1/S2, no murmurs, rubs or gallops appreciated; no LE edema Lungs:  CTAB Abdomen:  Soft, NDNT Musculoskeletal:  Able to move extremities to command Skin:  intact   LABS: Cbc  Recent Labs Lab 02/17/17 0430 02/17/17 1430  02/18/17 0459 02/18/17 1635 02/19/17 0510  WBC 11.0* 16.1*  --   --   --  7.7  HGB 5.6* 9.5*  < > 9.0* 8.6* 7.7*  HCT 16.6* 27.7*  < > 26.1* 24.7* 22.6*  PLT 140* 138*  --   --   --  98*  < > = values in this interval not displayed.  Chemistry   Recent Labs Lab 02/17/17 1333  02/17/17 2023 02/18/17 0459 02/19/17 0511  NA  --   < > 138 137 137  K  --   < > 5.2* 2.9* 4.6  CL  --   < > 107 100* 101  CO2  --   < > 17* 25 23  BUN  --   < > 150* 47* 76*  CREATININE  --   < > 7.84* 3.06* 5.08*  CALCIUM  --   < > 9.2 8.8* 8.8*  MG 1.5*  --   --  1.7  --   PHOS 5.1*  --   --  2.2* 6.1*  GLUCOSE  --   < > 107* 107* 148*  < > = values in this interval not displayed.  Liver fxn  Recent Labs Lab 02/16/17 0741 02/19/17 0511  AST 13*  --   ALT 13*  --   ALKPHOS 37*  --   BILITOT 1.6*  --   PROT 5.2*  --   ALBUMIN 2.4* 2.2*   coags  Recent Labs Lab 02/17/17 2340 02/18/17 0500 02/19/17 0510  INR 1.31 1.12 1.19   Sepsis markers No results for input(s): LATICACIDVEN, PROCALCITON in the last 168 hours. Cardiac markers  Recent Labs Lab 02/17/17 0430 02/17/17 1030 02/17/17 1430  TROPONINI 0.03* 0.04* 0.03*   BNP No results for input(s): PROBNP in the last 168 hours. ABG  Recent Labs Lab 02/17/17 1257  PHART 7.274*  PCO2ART 39.6  PO2ART 394.0*  HCO3 18.5*  TCO2 20    CBG trend  Recent Labs Lab 02/16/17 1621 02/16/17 1935 02/17/17 0024 02/17/17 0417  GLUCAP 94 87 105* 148*     IMAGING:  ECG:  DIAGNOSES: Principal Problem:   GIB (gastrointestinal bleeding) Active Problems:   Anemia   ESRD (end stage renal disease) (HCC)   Benign essential HTN   GERD (gastroesophageal reflux disease)   Acute on chronic diastolic CHF (congestive heart failure) (HCC)   Atrial fibrillation with RVR (HCC)   Melena   Hematemesis   Acute blood loss anemia   Chronic anticoagulation   Chronic gastric ulcer with hemorrhage   Acute post-traumatic headache, not intractable   ASSESSMENT / PLAN:  PULMONARY  ASSESSMENT: Intubated 4/22 after finding of significant blood clot in gastric body No air leak after cuff deflation yesterday so remained intubated; started solumedrol Had air leak today PLAN:   Consider extubation later today Had steady drop in Hgb overnight but could be dilutional as she is due for HD - f/u GI reccs, if repeat EGD planned may keep intubated Continue solumedrol  CARDIOVASCULAR  ASSESSMENT:  Hgb trended down to 7.7 this AM; INR 1.2 A fib with RVR - rates in low 100s on cardizem gtt PLAN:  Cardizem gtt PRBCs and FFPs given  RENAL  ASSESSMENT:  ESRD, HD TThS PLAN:   HD today per nephro  GASTROINTESTINAL  ASSESSMENT:  GI bleed, upper - found one ulcer with exposed vessel on EGD 4/22 s/p clip and Epi H/H trending down to 7.7 this AM s/p 5 units pRBCs and 6 units FFPs PLAN:   Continue PPI gtt Consider transfusing 1 unit pRBCs with HD  HEMATOLOGIC  ASSESSMENT:  Anemia, GI bleed s/p 5 units pRBCs and 6 units FFPs Trend down in Hgb since yesterday - this AM 7.7, plts 98, INR 1.2 PLAN:  Consider transfusing 1 unit pRBCs with HD; f/u GI reccs  INFECTIOUS  ASSESSMENT:  No infection noted PLAN:   Follow  ENDOCRINE  ASSESSMENT: mild hyperglcyemia PLAN:   cbg  NEUROLOGIC  ASSESSMENT: Alert and off of sedation PLAN:   Continue monitoring  I have personally obtained a history, examined the patient, evaluated  laboratory and imaging results, formulated the assessment and plan and placed orders. CRITICAL CARE: The patient is critically ill with multiple organ systems failure and requires high complexity decision making for assessment and support, frequent evaluation and titration of therapies, application of advanced monitoring technologies and extensive interpretation of multiple databases. Critical Care Time devoted to patient care services described in this note is 35 minutes.   Nyra Market, MD PCCM - PGY1 Pulmonary and Critical Care Medicine [facility] Pager: [phone]  02/19/2017, 7:46 AM

## 2017-02-19 NOTE — Progress Notes (Signed)
Arrived to patient room 62M-14 at 1400.  Reviewed treatment plan and this RN agrees.  Report received from bedside RN, Paticia Stack.  Consent verified.  Patient A & O X 3. Lung sounds diminished to ausculation in all fields. No edema. Cardiac: Afib.  Prepped LUAVF with alcohol and cannulated with two 15 gauge needles.  Pulsation of blood noted.  Flushed access well with saline per protocol.  Connected and secured lines and initiated tx at 1420.  UF goal of 1500 mL and net fluid removal of 1000 mL.  Will continue to monitor.

## 2017-02-19 NOTE — Progress Notes (Signed)
Dialysis treatment completed.  1500 mL ultrafiltrated and net fluid removal 1000 mL.    Patient status uchanged. Lung sounds diminished to ausculation in all fields. No edema. Cardiac: Afib, RVR.  Disconnected lines and removed needles.  Pressure held for 10 minutes and band aid/gauze dressing applied.  Report given to bedside RN, Paticia Stack.

## 2017-02-19 NOTE — Progress Notes (Signed)
Patient ID: Patricia Ayala, female   DOB: 11-15-1928, 83 y.o.   MRN: [id number]  [facility] Progress Note    Subjective:   Awake and alert, remains intubated   Objective:   BP 91/66   Pulse (!) 122   Temp 98.4 F (36.9 C) (Oral)   Resp 19   Ht _0  (1.626 m)   Wt 84.7 kg (186 lb 11.7 oz)   SpO2 100%   BMI 32.05 kg/m   Intake/Output: I/O last 3 completed shifts: In: 1930.8 [I.V.:1880.8; IV Piggyback:50] Out: 0    Intake/Output this shift:  Total I/O In: 35.5 [I.V.:35.5] Out: -  Weight change: 0 kg (0 lb)  Physical Exam: Gen:WD NAD CBJ:SEGBT Resp:cta DVV:OHYWVP Ext:no edema, LUE AVF +T/B  Labs: BMET  Recent Labs Lab 02/16/17 0741 02/17/17 0430 02/17/17 1333 02/17/17 1430 02/17/17 2023 02/18/17 0459 02/19/17 0511  NA 137 139  --  138 138 137 137  K 4.4 5.3*  --  4.1 5.2* 2.9* 4.6  CL 102 106  --  112* 107 100* 101  CO2 21* 17*  --  14* 17* 25 23  GLUCOSE 129* 173*  --  166* 107* 107* 148*  BUN 120* 145*  --  127* 150* 47* 76*  CREATININE 6.70* 7.72*  --  6.44* 7.84* 3.06* 5.08*  ALBUMIN 2.4*  --   --   --   --   --  2.2*  CALCIUM 8.4* 8.8*  --  7.6* 9.2 8.8* 8.8*  PHOS  --   --  5.1*  --   --  2.2* 6.1*   CBC  Recent Labs Lab 02/16/17 0741  02/17/17 0430 02/17/17 1430  02/17/17 2317 02/18/17 0459 02/18/17 1635 02/19/17 0510  WBC 11.3*  --  11.0* 16.1*  --   --   --   --  7.7  NEUTROABS 9.3*  --   --   --   --   --   --   --   --   HGB 7.3*  < > 5.6* 9.5*  < > 8.8* 9.0* 8.6* 7.7*  HCT 23.2*  < > 16.6* 27.7*  < > 24.7* 26.1* 24.7* 22.6*  MCV 94.7  --  87.4 82.7  --   --   --   --  84.0  PLT 306  --  140* 138*  --   --   --   --  98*  < > = values in this interval not displayed.  _1 @ Medications:    . chlorhexidine gluconate (MEDLINE KIT)  15 mL Mouth Rinse BID  . mouth rinse  15 mL Mouth Rinse QID  . methylPREDNISolone (SOLU-MEDROL) injection  40 mg Intravenous Q8H  . pantoprazole  40 mg Intravenous Q12H  .  sodium chloride flush  3 mL Intravenous Q12H   Normally TTS in [place] with Dr. Lowanda Foster, off schedule, had HD late last night/early this am.  Assessment/ Plan:    1. ABLA due to gastric ulcer- s/p EGD with clipping/epi.  H/H trending down. GI following and transfuse prn. 2. ESRD- normally TTS but off schedule.  Plan for HD today to get back on schedule.  No heparin 3. A fib s/p RVR- now rate controlled 4. VDRF- per PCCM 5. HTN- stable  6. COPD 7. Hypokalemia- following HD, do not replete as she will re-equilibrate later  Donetta Potts, MD [place] Pager [phone] 02/19/2017, 9:02 AM

## 2017-02-19 NOTE — Progress Notes (Signed)
Daily Rounding Note  02/19/2017, 9:45 AM  LOS: 3 days    ASSESMENT:   *  UGIB with hematemesis, black stools.  Pt taking ASA, Aleve at home.  EGD 4/22 with clipping and cautery of non-bleeding GU with VV but blood in stomach.   PPI drip finishes at 1144 today.  Then starts BID IV Protonix.  With ongoing blood PR and drop in Hgb, does she need repeat EGD?  *  A fib.  Chronic Coumadin initiated during 11/01/16 admission.   INR apparently >4 within 2 days of admission. Coags corrected. s/p FFP x 6.  s/p Vitamin K 10 mg SQ x 1 and 5 mg IV x 1.     *  ABL anemia.  Hgb stable s/p PRBC x 5.  Hgb 9 >> 8.6 >> 7.7.   Thrombocytopenia, non-critical.    *  ESRD.  TTS dialysis but off schedule. Plan for HD today to get back on schedule    *  Mild bump Troponins, 0.04, 0.03, likely demand ischemia in setting of anemia.      PLAN   *  CBC in AM.  H pylori Ab testing ordered.      Jennye Moccasin  02/19/2017, 9:45 AM Pager: [phone]    [facility] Attending   I have taken an interval history, reviewed the chart and examined the patient. I agree with the Advanced Practitioner's note, impression and recommendations.   Slight decrease in Hgb today 1 -2 small melenic stools - not red from my hx Extubated and alert and oriented We discussed Aleve usage for leg pain and not to repeat that  Will see where Hgb is tomorrow and if any further melena etc and determine if she will need another EGD while here  If ok I think out to floor tomorrow  Iva Boop, MD, [facility] Gastroenterology [phone] (pager) [phone] after 5 PM, weekends and holidays  02/19/2017 6:57 PM     SUBJECTIVE:   Chief complaint: hematochezia.  1 episode last night and antother (small) this AM.  Both still bloody.   Pt alert and is hungry, denies nausea or abdominal pain.   Plan is to extubate her today  OBJECTIVE:         Vital signs in  last 24 hours:    Temp:  [98.4 F (36.9 C)-99.2 F (37.3 C)] 98.4 F (36.9 C) (04/24 0819) Pulse Rate:  [25-125] 122 (04/24 0800) Resp:  [12-20] 19 (04/24 0800) BP: (84-141)/(55-87) 91/66 (04/24 0600) SpO2:  [100 %] 100 % (04/24 0800) FiO2 (%):  [40 %] 40 % (04/24 0740) Weight:  [84.7 kg (186 lb 11.7 oz)] 84.7 kg (186 lb 11.7 oz) (04/24 0455) Last BM Date: 02/17/17 Filed Weights   02/18/17 0423 02/18/17 0530 02/19/17 0455  Weight: 87.4 kg (192 lb 10.9 oz) 82.2 kg (181 lb 3.5 oz) 84.7 kg (186 lb 11.7 oz)   General: pleasant, alert, intubated   Heart: Irreg, irreg.  Rate in low 100s.   Chest: clear bil.  Intubated, ETT in place Abdomen: soft, NT.  ND.  Active BS.    Extremities: no CCE Neuro/Psych:  Alert, appropriate, follows commands.  Moves al 4 limbs.    Lab Results:  Recent Labs  02/17/17 0430 02/17/17 1430  02/18/17 0459 02/18/17 1635 02/19/17 0510  WBC 11.0* 16.1*  --   --   --  7.7  HGB 5.6* 9.5*  < > 9.0* 8.6* 7.7*  HCT 16.6* 27.7*  < > 26.1* 24.7* 22.6*  PLT 140* 138*  --   --   --  98*  < > = values in this interval not displayed. BMET  Recent Labs  02/17/17 2023 02/18/17 0459 02/19/17 0511  NA 138 137 137  K 5.2* 2.9* 4.6  CL 107 100* 101  CO2 17* 25 23  GLUCOSE 107* 107* 148*  BUN 150* 47* 76*  CREATININE 7.84* 3.06* 5.08*  CALCIUM 9.2 8.8* 8.8*   LFT  Recent Labs  02/19/17 0511  ALBUMIN 2.2*   PT/INR  Recent Labs  02/18/17 0500 02/19/17 0510  LABPROT 14.4 15.2  INR 1.12 1.19

## 2017-02-20 LAB — TYPE AND SCREEN
ABO/RH(D): A POS
Antibody Screen: NEGATIVE
UNIT DIVISION: 0
Unit division: 0
Unit division: 0
Unit division: 0
Unit division: 0
Unit division: 0
Unit division: 0
Unit division: 0
Unit division: 0
Unit division: 0

## 2017-02-20 LAB — BPAM RBC
BLOOD PRODUCT EXPIRATION DATE: 201805032359
BLOOD PRODUCT EXPIRATION DATE: 201805042359
Blood Product Expiration Date: 201805082359
Blood Product Expiration Date: 201805082359
Blood Product Expiration Date: 201805092359
Blood Product Expiration Date: 201805102359
Blood Product Expiration Date: 201805102359
Blood Product Expiration Date: 201805102359
Blood Product Expiration Date: 201805102359
Blood Product Expiration Date: 201805162359
ISSUE DATE / TIME: 201804211548
ISSUE DATE / TIME: 201804220211
ISSUE DATE / TIME: 201804220533
ISSUE DATE / TIME: 201804220533
ISSUE DATE / TIME: 201804220533
ISSUE DATE / TIME: 201804241331
ISSUE DATE / TIME: 201804241331
UNIT TYPE AND RH: 6200
UNIT TYPE AND RH: 6200
UNIT TYPE AND RH: 6200
UNIT TYPE AND RH: 6200
UNIT TYPE AND RH: 6200
Unit Type and Rh: 6200
Unit Type and Rh: 6200
Unit Type and Rh: 6200
Unit Type and Rh: 6200
Unit Type and Rh: 9500

## 2017-02-20 LAB — RENAL FUNCTION PANEL
Albumin: 2.2 g/dL — ABNORMAL LOW (ref 3.5–5.0)
Anion gap: 10 (ref 5–15)
BUN: 40 mg/dL — AB (ref 6–20)
CALCIUM: 8.8 mg/dL — AB (ref 8.9–10.3)
CHLORIDE: 97 mmol/L — AB (ref 101–111)
CO2: 28 mmol/L (ref 22–32)
CREATININE: 3.31 mg/dL — AB (ref 0.44–1.00)
GFR calc Af Amer: 14 mL/min — ABNORMAL LOW (ref >60)
GFR, EST NON AFRICAN AMERICAN: 12 mL/min — AB (ref >60)
Glucose, Bld: 147 mg/dL — ABNORMAL HIGH (ref 65–99)
Phosphorus: 6.5 mg/dL — ABNORMAL HIGH (ref 2.5–4.6)
Potassium: 4 mmol/L (ref 3.5–5.1)
SODIUM: 135 mmol/L (ref 135–145)

## 2017-02-20 LAB — CBC
HCT: 22.4 % — ABNORMAL LOW (ref 36.0–46.0)
Hemoglobin: 7.4 g/dL — ABNORMAL LOW (ref 12.0–15.0)
MCH: 28.7 pg (ref 26.0–34.0)
MCHC: 33 g/dL (ref 30.0–36.0)
MCV: 86.8 fL (ref 78.0–100.0)
PLATELETS: 116 10*3/uL — AB (ref 150–400)
RBC: 2.58 MIL/uL — ABNORMAL LOW (ref 3.87–5.11)
RDW: 18.3 % — AB (ref 11.5–15.5)
WBC: 8.3 10*3/uL (ref 4.0–10.5)

## 2017-02-20 LAB — H PYLORI, IGM, IGG, IGA AB
H PYLORI IGG: 3.3 {index_val} — AB (ref 0.00–0.79)
H. Pylogi, Iga Abs: <9 units (ref 0.0–8.9)

## 2017-02-20 MED ORDER — ALPRAZOLAM 0.5 MG PO TABS
0.5000 mg | ORAL_TABLET | Freq: Every evening | ORAL | Status: DC | PRN
  Administered 2017-02-20 – 2017-02-21 (×3): 0.5 mg via ORAL
  Filled 2017-02-20 (×3): qty 1

## 2017-02-20 MED ORDER — PANTOPRAZOLE SODIUM 40 MG PO TBEC: 40.0000 mg | DELAYED_RELEASE_TABLET | Freq: Two times a day (BID) | ORAL | Status: DC

## 2017-02-20 MED ORDER — DILTIAZEM HCL ER COATED BEADS 120 MG PO CP24
120.0000 mg | ORAL_CAPSULE | Freq: Every evening | ORAL | Status: DC
  Administered 2017-02-20 – 2017-02-21 (×2): 120 mg via ORAL
  Filled 2017-02-20 (×2): qty 1

## 2017-02-20 MED ORDER — DILTIAZEM HCL ER COATED BEADS 120 MG PO CP24
240.0000 mg | ORAL_CAPSULE | Freq: Every morning | ORAL | Status: DC
  Administered 2017-02-20 – 2017-02-22 (×3): 240 mg via ORAL
  Filled 2017-02-20: qty 2
  Filled 2017-02-20 (×2): qty 1

## 2017-02-20 MED ORDER — PANTOPRAZOLE SODIUM 40 MG PO TBEC
40.0000 mg | DELAYED_RELEASE_TABLET | Freq: Two times a day (BID) | ORAL | Status: DC
  Administered 2017-02-20 – 2017-02-22 (×4): 40 mg via ORAL
  Filled 2017-02-20 (×4): qty 1

## 2017-02-20 NOTE — Progress Notes (Addendum)
PULMONARY  / CRITICAL CARE MEDICINE  Name: Patricia Ayala MRN: [id number] DOB: Mar 24, 1929    LOS: 4  REFERRING MD :  Patricia Schilder, MD - [facility]  CHIEF COMPLAINT:  Melena, hematemesis  Brief patient description: Ms. Patricia Ayala is an 83yo female with PMH of A fib (recently started on Coumadin ~6weeks ago), CHF, HTN, HLD, ESRD on HD (TThS) who was transferred from [facility] for UGI bleed management. Patient presented to [facility] 4/21 for one day history of dark diarrhea; while in the ED, patient was noted to have hematemesis as well. Hgb on arrival to ED was 7.3, plts 306.   Lines / Drains: CVC RIJ 4/22>> PIV R hand 4/21>> PIV R arm 4/21>>  Cultures: None  Antibiotics: None  Tests / Events: 4/21 - presented to [facility] ED with dark diarrhea; had hematemesis in ED; transfer to [facility] 4/22 - EGD with large clot in gastric body; found one ulcer with exposed vessel s/p clip and epinephrine 4/24 - extubated  SUBJECTIVE: Patient alert this AM; denies abdominal pain, chest pain.   PAST MEDICAL HISTORY :  Past Medical History:  Diagnosis Date  . Anemia 02/11/2012  . Anxiety   . Atrial fibrillation (HCC)   . CHF (congestive heart failure) (HCC)   . Chronic kidney disease    not on dialysis yet, Tues, thurs, sat  . GERD (gastroesophageal reflux disease)   . H/O metabolic acidosis   . Hyperlipidemia   . Hyperparathyroidism, secondary (HCC)   . Hypertension    Dr. Regino Ayala, Patricia Ayala, [place]  . Oxygen dependent   . Vitamin D deficiency    Past Surgical History:  Procedure Laterality Date  . ABDOMINAL HYSTERECTOMY    . AV FISTULA PLACEMENT  04/01/2012   Procedure: ARTERIOVENOUS (AV) FISTULA CREATION;  Surgeon: Sherren Kerns, MD;  Location: [facility] OR;  Service: Vascular;  Laterality: Left;  . CHOLECYSTECTOMY    . COLON SURGERY     for a blockage  . EYE SURGERY     bilateral cataracts, /w IOL - 2013  . FISTULOGRAM N/A 11/05/2014   Procedure: FISTULOGRAM;  Surgeon: Larina Earthly, MD;  Location: [facility]  CATH LAB;  Service: Cardiovascular;  Laterality: N/A;  . INSERTION OF DIALYSIS CATHETER  04/10/2012   Procedure: INSERTION OF DIALYSIS CATHETER;  Surgeon: Pryor Ochoa, MD;  Location: [facility] OR;  Service: Vascular;  Laterality: N/A;  Insertion Diatek Catheter Right Internal Jugular  . LIGATION OF COMPETING BRANCHES OF ARTERIOVENOUS FISTULA  11/07/2012   Procedure: LIGATION OF COMPETING BRANCHES OF ARTERIOVENOUS FISTULA;  Surgeon: Chuck Hint, MD;  Location: [facility] OR;  Service: Vascular;  Laterality: Left;  Brachio/Cephalic Fistula  . SHUNTOGRAM N/A 11/03/2012   Procedure: Betsey Amen;  Surgeon: Chuck Hint, MD;  Location: [facility] CATH LAB;  Service: Cardiovascular;  Laterality: N/A;  . SHUNTOGRAM Left 03/09/2013   Procedure: Fistulogram;  Surgeon: Fransisco Hertz, MD;  Location: [facility] CATH LAB;  Service: Cardiovascular;  Laterality: Left;  . SHUNTOGRAM Left 11/23/2013   Procedure: FISTULOGRAM;  Surgeon: Fransisco Hertz, MD;  Location: [facility] CATH LAB;  Service: Cardiovascular;  Laterality: Left;   Prior to Admission medications   Medication Sig Start Date End Date Taking? Authorizing Provider  ALPRAZolam Prudy Feeler) 0.5 MG tablet Take 0.5 mg by mouth 3 (three) times daily as needed for anxiety.   Yes Historical Provider, MD  Biotin 1 MG CAPS Take 1 mg by mouth daily.   Yes Historical Provider, MD  diltiazem (CARDIZEM CD) 120 MG 24  hr capsule Take 2 tablets in the am ( 240 mg)  And take 1 tablet (120 mg) at night 12/03/16  Yes Laqueta Linden, MD  ipratropium-albuterol (DUONEB) 0.5-2.5 (3) MG/3ML SOLN Take 3 mLs by nebulization every 6 (six) hours as needed. 11/06/16  Yes Kathlen Mody, MD  lidocaine-prilocaine (EMLA) cream Apply 1 application topically as needed (for IV access).  01/12/13  Yes Historical Provider, MD  metoprolol tartrate (LOPRESSOR) 25 MG tablet Take 1 tablet (25 mg total) by mouth 2 (two) times daily. 11/10/16  Yes Leroy Sea, MD  multivitamin (RENA-VIT) TABS tablet Take 1 tablet by mouth  daily.   Yes Historical Provider, MD  pentoxifylline (TRENTAL) 400 MG CR tablet Take 400 mg by mouth 3 (three) times daily with meals.   Yes Historical Provider, MD  promethazine (PHENERGAN) 25 MG tablet Take 25 mg by mouth every 6 (six) hours as needed for nausea or vomiting.   Yes Historical Provider, MD  sevelamer carbonate (RENVELA) 800 MG tablet Take 1,600 mg by mouth 3 (three) times daily with meals. Take 800mg  with snacks daily.    Yes Historical Provider, MD  simvastatin (ZOCOR) 10 MG tablet Take 10 mg by mouth daily.   Yes Historical Provider, MD  warfarin (COUMADIN) 5 MG tablet Take 1 1/2 tablets daily except 1 tablet on Tuesdays, Thursdays and Saturdays 12/24/16  Yes Laqueta Linden, MD   No Known Allergies  FAMILY HISTORY:  Family History  Problem Relation Age of Onset  . Stroke Mother   . Cancer Father   . Heart attack Father    SOCIAL HISTORY:  reports that she has never smoked. She has never used smokeless tobacco. She reports that she does not drink alcohol or use drugs.   INTERVAL HISTORY:   VITAL SIGNS: Temp:  [97.8 F (36.6 C)-98.6 F (37 C)] 98.6 F (37 C) (04/25 0343) Pulse Rate:  [28-143] 110 (04/25 0600) Resp:  [11-23] 18 (04/25 0600) BP: (97-140)/(51-115) 121/74 (04/25 0600) SpO2:  [97 %-100 %] 100 % (04/25 0600) FiO2 (%):  [40 %] 40 % (04/24 1235) Weight:  [83.7 kg (184 lb 8.4 oz)-84.7 kg (186 lb 11.7 oz)] 84.4 kg (186 lb 1.1 oz) (04/25 0455) HEMODYNAMICS: CVP:  [6 mmHg-12 mmHg] 6 mmHg VENTILATOR SETTINGS: Vent Mode: PSV;CPAP FiO2 (%):  [40 %] 40 % PEEP:  [5 cmH20] 5 cmH20 Pressure Support:  [5 cmH20] 5 cmH20 INTAKE / OUTPUT: Intake/Output      04/24 0701 - 04/25 0700 04/25 0701 - 04/26 0700   P.O. 200    I.V. (mL/kg) 561 (6.6)    Total Intake(mL/kg) 761 (9)    Other 1000    Stool 0    Total Output 1000     Net -239          Urine Occurrence 1 x    Stool Occurrence 1 x      PHYSICAL EXAMINATION: General:  NAD, elderly female  patient Neuro:  Alert, following commands, PERRL   HEENT:  ETT in place Neck:  RIJ CVC in place   Cardiovascular:  Tachycardic, irregularly irregular rhythm; N S1/S2, no murmurs, rubs or gallops appreciated; no LE edema Lungs:  CTAB Abdomen:  Soft, NDNT Musculoskeletal:  Able to move extremities to command Skin:  intact   LABS: Cbc  Recent Labs Lab 02/17/17 1430  02/18/17 1635 02/19/17 0510 02/20/17 0450  WBC 16.1*  --   --  7.7 8.3  HGB 9.5*  < > 8.6* 7.7* 7.4*  HCT 27.7*  < > 24.7* 22.6* 22.4*  PLT 138*  --   --  98* 116*  < > = values in this interval not displayed.  Chemistry   Recent Labs Lab 02/17/17 1333  02/18/17 0459 02/19/17 0511 02/20/17 0450  NA  --   < > 137 137 135  K  --   < > 2.9* 4.6 4.0  CL  --   < > 100* 101 97*  CO2  --   < > 25 23 28   BUN  --   < > 47* 76* 40*  CREATININE  --   < > 3.06* 5.08* 3.31*  CALCIUM  --   < > 8.8* 8.8* 8.8*  MG 1.5*  --  1.7  --   --   PHOS 5.1*  --  2.2* 6.1* 6.5*  GLUCOSE  --   < > 107* 148* 147*  < > = values in this interval not displayed.  Liver fxn  Recent Labs Lab 02/16/17 0741 02/19/17 0511 02/20/17 0450  AST 13*  --   --   ALT 13*  --   --   ALKPHOS 37*  --   --   BILITOT 1.6*  --   --   PROT 5.2*  --   --   ALBUMIN 2.4* 2.2* 2.2*   coags  Recent Labs Lab 02/17/17 2340 02/18/17 0500 02/19/17 0510  INR 1.31 1.12 1.19   Sepsis markers No results for input(s): LATICACIDVEN, PROCALCITON in the last 168 hours. Cardiac markers  Recent Labs Lab 02/17/17 0430 02/17/17 1030 02/17/17 1430  TROPONINI 0.03* 0.04* 0.03*   BNP No results for input(s): PROBNP in the last 168 hours. ABG  Recent Labs Lab 02/17/17 1257  PHART 7.274*  PCO2ART 39.6  PO2ART 394.0*  HCO3 18.5*  TCO2 20    CBG trend  Recent Labs Lab 02/16/17 1621 02/16/17 1935 02/17/17 0024 02/17/17 0417  GLUCAP 94 87 105* 148*    DIAGNOSES: Principal Problem:   GIB (gastrointestinal bleeding) Active  Problems:   Anemia   ESRD (end stage renal disease) (HCC)   Benign essential HTN   GERD (gastroesophageal reflux disease)   Acute on chronic diastolic CHF (congestive heart failure) (HCC)   Atrial fibrillation with RVR (HCC)   Melena   Hematemesis   Acute blood loss anemia   Chronic anticoagulation   Chronic gastric ulcer with hemorrhage   Acute post-traumatic headache, not intractable   ASSESSMENT / PLAN:  PULMONARY  ASSESSMENT: Intubated 4/22 after finding of significant blood clot in gastric body Extubated 4/24, solumedrol stopped PLAN:   Continue monitoring   CARDIOVASCULAR  ASSESSMENT:  Hgb trended down to 7.4 this AM A fib with RVR - rates in low 100s on cardizem gtt PLAN:  Cardizem gtt  RENAL  ASSESSMENT:  ESRD, HD TThS PLAN:   Per nephrology - appreciate their assistance  GASTROINTESTINAL  ASSESSMENT:  GI bleed, upper - found one ulcer with exposed vessel on EGD 4/22 s/p clip and Epi H/H trending down to 7.4 this AM  s/p 5 units pRBCs and 6 units FFPs PLAN:   Continue PPI IV BID Consider transfusing 1 unit pRBCs f/u GI reccs  HEMATOLOGIC  ASSESSMENT:  Anemia, GI bleed s/p 5 units pRBCs and 6 units FFPs Slow trend down in Hgb over last two days- this AM 7.4, plts 116 PLAN:  Consider transfusing 1 unit pRBCs  INFECTIOUS  ASSESSMENT:  No infection noted PLAN:   Follow  ENDOCRINE  ASSESSMENT: mild hyperglcyemia PLAN:   cbg  NEUROLOGIC  ASSESSMENT: Alert and oriented PLAN:   Continue monitoring  I have personally obtained a history, examined the patient, evaluated laboratory and imaging results, formulated the assessment and plan and placed orders. CRITICAL CARE: The patient is critically ill with multiple organ systems failure and requires high complexity decision making for assessment and support, frequent evaluation and titration of therapies, application of advanced monitoring technologies and extensive interpretation of  multiple databases. Critical Care Time devoted to patient care services described in this note is 35 minutes.   Nyra Market, MD PCCM - PGY1 Pulmonary and Critical Care Medicine [facility] Pager: [phone]  02/20/2017, 7:24 AM

## 2017-02-20 NOTE — Progress Notes (Signed)
Patient ID: Patricia Ayala, female   DOB: 30-May-1929, 83 y.o.   MRN: [id number]  [facility] Progress Note    Subjective:   Feels well but had trouble sleeping last night.   Objective:   BP 119/72 (BP Location: Right Arm)   Pulse 96   Temp 98 F (36.7 C) (Oral)   Resp (!) 24   Ht 5\' 4"  (1.626 m)   Wt 84.4 kg (186 lb 1.1 oz)   SpO2 100%   BMI 31.94 kg/m   Intake/Output: I/O last 3 completed shifts: In: 1409 [P.O.:200; I.V.:1209] Out: 1000 [Other:1000]   Intake/Output this shift:  Total I/O In: 10 [I.V.:10] Out: -  Weight change: 0 kg (0 lb)  Physical Exam: Gen:WD AAF in NAD CVS:no rub Resp:cta CVE:LFYBOF Ext:no edema, LUE AVF +T/B  Labs: BMET  Recent Labs Lab 02/16/17 0741 02/17/17 0430 02/17/17 1333 02/17/17 1430 02/17/17 2023 02/18/17 0459 02/19/17 0511 02/20/17 0450  NA 137 139  --  138 138 137 137 135  K 4.4 5.3*  --  4.1 5.2* 2.9* 4.6 4.0  CL 102 106  --  112* 107 100* 101 97*  CO2 21* 17*  --  14* 17* 25 23 28   GLUCOSE 129* 173*  --  166* 107* 107* 148* 147*  BUN 120* 145*  --  127* 150* 47* 76* 40*  CREATININE 6.70* 7.72*  --  6.44* 7.84* 3.06* 5.08* 3.31*  ALBUMIN 2.4*  --   --   --   --   --  2.2* 2.2*  CALCIUM 8.4* 8.8*  --  7.6* 9.2 8.8* 8.8* 8.8*  PHOS  --   --  5.1*  --   --  2.2* 6.1* 6.5*   CBC  Recent Labs Lab 02/16/17 0741  02/17/17 0430 02/17/17 1430  02/18/17 0459 02/18/17 1635 02/19/17 0510 02/20/17 0450  WBC 11.3*  --  11.0* 16.1*  --   --   --  7.7 8.3  NEUTROABS 9.3*  --   --   --   --   --   --   --   --   HGB 7.3*  < > 5.6* 9.5*  < > 9.0* 8.6* 7.7* 7.4*  HCT 23.2*  < > 16.6* 27.7*  < > 26.1* 24.7* 22.6* 22.4*  MCV 94.7  --  87.4 82.7  --   --   --  84.0 86.8  PLT 306  --  140* 138*  --   --   --  98* 116*  < > = values in this interval not displayed.  @IMGRELPRIORS @ Medications:    . pantoprazole  40 mg Intravenous Q12H  . sodium chloride flush  3 mL Intravenous Q12H   Dialysis: TTS in [place],  followed by Dr. Kristian Covey   Assessment/ Plan:    1. ABLA due to gastric ulcer- s/p EGD with clipping/epi.  H/H trending down. GI following and transfuse prn. 2. ESRD- normally TTS but off schedule. doing well.  Continue with outpatient schedule.  No heparin 3. A fib s/p RVR- now rate controlled 4. Pulm- extubated 02/19/17 per PCCM and doing well (was intubated for EGD) 5. HTN- stable  6. COPD 7. Hypokalemia- following HD, do not replete as she will re-equilibrate later  Irena Cords, MD [facility], [facility] Pager [phone] 02/20/2017, 8:50 AM

## 2017-02-20 NOTE — Progress Notes (Addendum)
Daily Rounding Note  02/20/2017, 10:15 AM  LOS: 4 days   SUBJECTIVE:   Chief complaint: none.      Extubated yesterday.  Tolerating clear liquid diet. Stool yesterday more black, smaller amount.  No belly pain.    OBJECTIVE:         Vital signs in last 24 hours:    Temp:  [97.8 F (36.6 C)-98.6 F (37 C)] 98 F (36.7 C) (04/25 0730) Pulse Rate:  [28-144] 109 (04/25 0915) Resp:  [11-24] 19 (04/25 0915) BP: (97-161)/(51-115) 117/63 (04/25 0915) SpO2:  [92 %-100 %] 93 % (04/25 0915) FiO2 (%):  [40 %] 40 % (04/24 1235) Weight:  [83.7 kg (184 lb 8.4 oz)-84.7 kg (186 lb 11.7 oz)] 84.4 kg (186 lb 1.1 oz) (04/25 0455) Last BM Date: 02/19/17 Filed Weights   02/19/17 1400 02/19/17 1750 02/20/17 0455  Weight: 84.7 kg (186 lb 11.7 oz) 83.7 kg (184 lb 8.4 oz) 84.4 kg (186 lb 1.1 oz)   General: pleasant, alert, delightful.     Heart: RRR.   Chest: clear bil.  No dyspnea or cough Abdomen: soft, NT, active BS, ND.  Extremities: no CCE Neuro/Psych:  Alert, appropriate, oriented x 3.  No gross deficits,  No tremor.   Intake/Output from previous day: 04/24 0701 - 04/25 0700 In: 771 [P.O.:200; I.V.:571] Out: 1000   Intake/Output this shift: Total I/O In: 10 [I.V.:10] Out: -   Lab Results:  Recent Labs  02/17/17 1430  02/18/17 1635 02/19/17 0510 02/20/17 0450  WBC 16.1*  --   --  7.7 8.3  HGB 9.5*  < > 8.6* 7.7* 7.4*  HCT 27.7*  < > 24.7* 22.6* 22.4*  PLT 138*  --   --  98* 116*  < > = values in this interval not displayed. BMET  Recent Labs  02/18/17 0459 02/19/17 0511 02/20/17 0450  NA 137 137 135  K 2.9* 4.6 4.0  CL 100* 101 97*  CO2 25 23 28   GLUCOSE 107* 148* 147*  BUN 47* 76* 40*  CREATININE 3.06* 5.08* 3.31*  CALCIUM 8.8* 8.8* 8.8*   LFT  Recent Labs  02/19/17 0511 02/20/17 0450  ALBUMIN 2.2* 2.2*   PT/INR  Recent Labs  02/18/17 0500 02/19/17 0510  LABPROT 14.4 15.2  INR 1.12 1.19    Hepatitis Panel  Recent Labs  02/18/17 0118  HEPBSAG Negative    Studies/Results: No results found.   Scheduled Meds: . pantoprazole  40 mg Intravenous Q12H  . sodium chloride flush  3 mL Intravenous Q12H   Continuous Infusions: . sodium chloride    . sodium chloride    . sodium chloride Stopped (02/18/17 0700)  . diltiazem (CARDIZEM) infusion 10 mg/hr (02/20/17 0459)  . propofol (DIPRIVAN) infusion Stopped (02/19/17 0735)   PRN Meds:.sodium chloride, sodium chloride, acetaminophen, ALPRAZolam, alteplase, lidocaine (PF), lidocaine-prilocaine, ondansetron **OR** ondansetron (ZOFRAN) IV, pentafluoroprop-tetrafluoroeth, phenol   ASSESMENT:   * UGIB with hematemesis, black stools. Pt taking ASA, Aleve at home.  EGD 4/22 with clipping and cautery of non-bleeding GU with VV but blood in stomach.  PPI drip completed 4/24, no on BID IV Protonix.  H Pylori Abs processing.   * A fib. Coumadin initiated during 11/01/16 admission. INR apparently >4 within 2 days of admission. Coags corrected.s/p FFP x 6. s/p Vitamin K 10 mg SQ x 1 and 5 mg IV x 1.   * ABL anemia. Hgb stable s/p PRBC x 5.  Hgb 9 >> 8.6 >> 7.7 >> 7.4   Thrombocytopenia, non-critical.   * ESRD.  TTS dialysis schedule.    PLAN   *  Advance to soft, renal diet.    *  Switch to po BID Protonix.   *  CBC in AM.      Jennye Moccasin  02/20/2017, 10:15 AM Pager: [phone]

## 2017-02-20 NOTE — Progress Notes (Signed)
Patient to be transferred to telemetry; [facility] aware and will take over management 4/26 AM.   Nyra Market, MD PCCM - PGY1 Pager [phone]

## 2017-02-21 ENCOUNTER — Encounter: Payer: Self-pay | Admitting: Gastroenterology

## 2017-02-21 ENCOUNTER — Encounter (HOSPITAL_COMMUNITY): Payer: Self-pay | Admitting: Gastroenterology

## 2017-02-21 DIAGNOSIS — R768 Other specified abnormal immunological findings in serum: Secondary | ICD-10-CM

## 2017-02-21 LAB — CBC
HEMATOCRIT: 26.4 % — AB (ref 36.0–46.0)
HEMOGLOBIN: 8.4 g/dL — AB (ref 12.0–15.0)
MCH: 28.5 pg (ref 26.0–34.0)
MCHC: 31.8 g/dL (ref 30.0–36.0)
MCV: 89.5 fL (ref 78.0–100.0)
Platelets: 164 10*3/uL (ref 150–400)
RBC: 2.95 MIL/uL — ABNORMAL LOW (ref 3.87–5.11)
RDW: 18.1 % — AB (ref 11.5–15.5)
WBC: 13.3 10*3/uL — ABNORMAL HIGH (ref 4.0–10.5)

## 2017-02-21 MED ORDER — HEPARIN SODIUM (PORCINE) 1000 UNIT/ML DIALYSIS: 1000.0000 [IU] | INTRAMUSCULAR | Status: DC | PRN

## 2017-02-21 MED ORDER — METRONIDAZOLE 500 MG PO TABS
250.0000 mg | ORAL_TABLET | Freq: Four times a day (QID) | ORAL | Status: DC
  Administered 2017-02-21 – 2017-02-22 (×4): 250 mg via ORAL
  Filled 2017-02-21 (×4): qty 1

## 2017-02-21 MED ORDER — ACETAMINOPHEN 325 MG PO TABS
ORAL_TABLET | ORAL | Status: AC
  Administered 2017-02-21: 2 mg
  Filled 2017-02-21: qty 2

## 2017-02-21 MED ORDER — BISMUTH SUBSALICYLATE 262 MG PO CHEW
524.0000 mg | CHEWABLE_TABLET | Freq: Four times a day (QID) | ORAL | Status: DC
  Administered 2017-02-21 – 2017-02-22 (×3): 524 mg via ORAL
  Filled 2017-02-21 (×5): qty 2

## 2017-02-21 MED ORDER — DOXYCYCLINE HYCLATE 100 MG PO TABS
100.0000 mg | ORAL_TABLET | Freq: Two times a day (BID) | ORAL | Status: DC
  Administered 2017-02-21 – 2017-02-22 (×2): 100 mg via ORAL
  Filled 2017-02-21 (×2): qty 1

## 2017-02-21 MED ORDER — DILTIAZEM HCL 30 MG PO TABS
30.0000 mg | ORAL_TABLET | Freq: Once | ORAL | Status: AC
  Administered 2017-02-21: 30 mg via ORAL
  Filled 2017-02-21: qty 1

## 2017-02-21 MED ORDER — SODIUM CHLORIDE 0.9 % IV SOLN: 100.0000 mL | INTRAVENOUS | Status: DC | PRN

## 2017-02-21 NOTE — Progress Notes (Signed)
Pharmacy Antibiotic Note  Patricia Ayala is a 83 y.o. female admitted on 02/16/2017 with dark stools, found to have H.pylori.  Pharmacy has been consulted to double check medication dosing for H.pylori treatment in setting of ESRD requiring dialysis on TTS.  Flagyl, Pepto-Bismol, Protonix and doxycycline do not need to be renally adjusted.   Plan: - Continue H.pylori treatment regimen per Gastroenterologist.   - Would be cautious when Coumadin is resumed given DDI with Flagyl.   Height: 5\' 4"  (162.6 cm) Weight: 181 lb (82.1 kg) IBW/kg (Calculated) : 54.7  Temp (24hrs), Avg:98.2 F (36.8 C), Min:98 F (36.7 C), Max:98.5 F (36.9 C)   Recent Labs Lab 02/17/17 0430 02/17/17 1430 02/17/17 2023 02/18/17 0459 02/19/17 0510 02/19/17 0511 02/20/17 0450 02/21/17 1547  WBC 11.0* 16.1*  --   --  7.7  --  8.3 13.3*  CREATININE 7.72* 6.44* 7.84* 3.06*  --  5.08* 3.31*  --     Estimated Creatinine Clearance: 12.7 mL/min (A) (by C-G formula based on SCr of 3.31 mg/dL (H)).    No Known Allergies    Jaymien Landin D. Laney Potash, PharmD, BCPS Pager:  [phone] 02/21/2017, 5:57 PM

## 2017-02-21 NOTE — Progress Notes (Signed)
This RN confirmed transfer to telemetry per bedside RN.

## 2017-02-21 NOTE — Progress Notes (Signed)
Dialysis treatment completed.  2500 mL ultrafiltrated and net fluid removal 2000 mL.    Patient status unchanged. Lung sounds diminished to ausculation in all fields. Generalized edema. Cardiac: Afib.  Disconnected lines and removed needles.  Pressure held for 10 minutes and band aid/gauze dressing applied.  Report given to bedside RN, Selena Batten.

## 2017-02-21 NOTE — Progress Notes (Signed)
Patients heart rate elevated from 120- 150's. Per Dr Mariea Clonts give one time dose of Cardizem 30mg  now.

## 2017-02-21 NOTE — Progress Notes (Signed)
Patient ID: Patricia Ayala, female   DOB: 1929/01/05, 83 y.o.   MRN: [id number]  [facility] Progress Note    Subjective:   Feels good this morning.   Objective:   BP 127/64   Pulse 98   Temp 98 F (36.7 C) (Oral)   Resp 11   Ht 5\' 4"  (1.626 m)   Wt 84.1 kg (185 lb 6.5 oz)   SpO2 98%   BMI 31.83 kg/m   Intake/Output: I/O last 3 completed shifts: In: 503.8 [P.O.:300; I.V.:203.8] Out: 0    Intake/Output this shift:  No intake/output data recorded. Weight change: -0.6 kg (-1 lb 5.2 oz)  Physical Exam: Gen:WD WN AAF in NAD CVS:no rub Resp:cta ILN:ZVJKQA Ext: no edema, LUE AV +T/B  Labs: BMET  Recent Labs Lab 02/16/17 0741 02/17/17 0430 02/17/17 1333 02/17/17 1430 02/17/17 2023 02/18/17 0459 02/19/17 0511 02/20/17 0450  NA 137 139  --  138 138 137 137 135  K 4.4 5.3*  --  4.1 5.2* 2.9* 4.6 4.0  CL 102 106  --  112* 107 100* 101 97*  CO2 21* 17*  --  14* 17* 25 23 28   GLUCOSE 129* 173*  --  166* 107* 107* 148* 147*  BUN 120* 145*  --  127* 150* 47* 76* 40*  CREATININE 6.70* 7.72*  --  6.44* 7.84* 3.06* 5.08* 3.31*  ALBUMIN 2.4*  --   --   --   --   --  2.2* 2.2*  CALCIUM 8.4* 8.8*  --  7.6* 9.2 8.8* 8.8* 8.8*  PHOS  --   --  5.1*  --   --  2.2* 6.1* 6.5*   CBC  Recent Labs Lab 02/16/17 0741  02/17/17 0430 02/17/17 1430  02/18/17 0459 02/18/17 1635 02/19/17 0510 02/20/17 0450  WBC 11.3*  --  11.0* 16.1*  --   --   --  7.7 8.3  NEUTROABS 9.3*  --   --   --   --   --   --   --   --   HGB 7.3*  < > 5.6* 9.5*  < > 9.0* 8.6* 7.7* 7.4*  HCT 23.2*  < > 16.6* 27.7*  < > 26.1* 24.7* 22.6* 22.4*  MCV 94.7  --  87.4 82.7  --   --   --  84.0 86.8  PLT 306  --  140* 138*  --   --   --  98* 116*  < > = values in this interval not displayed.  @IMGRELPRIORS @ Medications:    . diltiazem  120 mg Oral QPM  . diltiazem  240 mg Oral q morning - 10a  . pantoprazole  40 mg Oral BID  . sodium chloride flush  3 mL Intravenous Q12H      Assessment/ Plan:   1. ABLA due to gastric ulcer- s/p EGD with clipping/epi. H/H trending down. GI following and transfuse prn. 2. ESRD- continue with TTS schedule. doing well.  No heparin 3. A fib s/p RVR- now rate controlled 4. Pulm- extubated 02/19/17 per PCCM and doing well (was intubated for EGD) 5. HTN- stable  6. COPD 7. Hypokalemia- following HD, do not replete as she will re-equilibrate later  Irena Cords, MD [facility], [facility] Pager [phone] 02/21/2017, 8:35 AM

## 2017-02-21 NOTE — Progress Notes (Addendum)
          Daily Rounding Note  02/21/2017, 2:47 PM  LOS: 5 days   ASSESMENT:   *  Gastric ulcer with VV.  Hematemesis and black stools resolved   *  ABL anemia.  s/p PRBC x 5.  Hgb still drifting lower.   *  Chronic Coumadin for A fib since 11/01/16.  On hold.  *  ESRD.  TTS HD.    PLAN   *  ? When/if safe to restart Coumadin?    *  CBC in AM.    *  BID Protonix for 4 weeks, then 1x daily.   *  Has ROV 5/17 at 11:30 with Dr Myrtie Neither.      Jennye Moccasin  02/21/2017, 2:47 PM Pager: [phone]  Improving  No warfarin until 5/7  H pylori IgG Ab + though I suspect naproxen caused bleeding will Tx  1) Bid PPI 2) Pepto Bismol 2 tabs (262 mg each) 4 times a day x 14 d 3) Metronidazole 250 mg 4 times a day x 14 d 4) doxycycline 100 mg 2 times a day x 14 d  Signing off  Call if ?  Iva Boop, MD, [facility] [phone] (pager) [phone] after 5 PM, weekends and holidays  02/21/2017 5:32 PM  SUBJECTIVE:   Chief complaint:     Last recorded stools were overnight x 2.  Recorded as small, black, soft.  Pt tolerating solids.  Feels well.  No abd pain or nausea  OBJECTIVE:         Vital signs in last 24 hours:    Temp:  [97.7 F (36.5 C)-98.2 F (36.8 C)] 98.2 F (36.8 C) (04/26 1100) Pulse Rate:  [48-106] 94 (04/26 1400) Resp:  [11-34] 14 (04/26 1100) BP: (95-150)/(53-102) 106/58 (04/26 1400) SpO2:  [95 %-100 %] 97 % (04/26 1000) Weight:  [84.1 kg (185 lb 6.5 oz)] 84.1 kg (185 lb 6.5 oz) (04/26 1100) Last BM Date: 02/19/17 Filed Weights   02/20/17 0455 02/21/17 0500 02/21/17 1100  Weight: 84.4 kg (186 lb 1.1 oz) 84.1 kg (185 lb 6.5 oz) 84.1 kg (185 lb 6.5 oz)   General: pleasant, elderly, looks comfortable and not acutely ill.    Heart: RRR Chest: clear bil.  No labored breathing Abdomen: soft, NT< ND, active BS  Extremities: no CCE Neuro/Psych:  Alert.  Oriented x 3.  Follows  commands.  Calm.  Moves all 4 limbs.   Lab Results:  Recent Labs  02/18/17 1635 02/19/17 0510 02/20/17 0450  WBC  --  7.7 8.3  HGB 8.6* 7.7* 7.4*  HCT 24.7* 22.6* 22.4*  PLT  --  98* 116*   BMET  Recent Labs  02/19/17 0511 02/20/17 0450  NA 137 135  K 4.6 4.0  CL 101 97*  CO2 23 28  GLUCOSE 148* 147*  BUN 76* 40*  CREATININE 5.08* 3.31*  CALCIUM 8.8* 8.8*

## 2017-02-21 NOTE — Progress Notes (Signed)
Patient Demographics:    Patricia Ayala, is a 83 y.o. female, DOB - [birth year], UEA:[id number]  Admit date - 02/16/2017   Admitting Physician Filbert Schilder, MD  Outpatient Primary MD for the patient is [name], MD  LOS - 5   Chief Complaint  Patient presents with  . Melena    headache        Subjective:    Patricia Ayala today has no fevers, no emesis,  No chest pain,  Soft dark stool, no vomiting, tolerating oral intake   Assessment  & Plan :    Principal Problem:   GIB (gastrointestinal bleeding) Active Problems:   Anemia   ESRD (end stage renal disease) (HCC)   Benign essential HTN   GERD (gastroesophageal reflux disease)   Acute on chronic diastolic CHF (congestive heart failure) (HCC)   Atrial fibrillation with RVR (HCC)   Melena   Hematemesis   Acute blood loss anemia   Chronic anticoagulation   Chronic gastric ulcer with hemorrhage   Acute post-traumatic headache, not intractable  Brief summary:- Patricia Ayala is a 83 y.o. female with medical history significant of atrial fibrillation (just started on coumadin), CHF, ESRD (on T/Th/Sat hemodialysis), HTN, HLD who presented to [facility] on 02/16/2017 with large volume black stools. Patient stated she had been taking Aleve for headaches. She was subsequently transferred to [facility] from [facility] . Patient was intubated to allow for EGD, On 02/17/2017 patient underwent EGD which showed gastric ulcer with visible vessel, there was red blood in the gastric body and fundus, the ulcer was injected, clipped and treated with cautery. Patient was extubated on 02/19/2017  Plan:- 1)Acute blood loss anemia- secondary to acute upper GI bleed in the setting of peptic ulcer disease, tolerating oral intake,  H&H stable, hemoglobin today is 8.4, patient did have dark soft stool 1 02/21/2017, no vomiting. Patient  received 5 units of packed cells during this admission, continue to follow H&H, continue Protonix 40 mg by mouth twice a day, continue to hold Coumadin. At baseline patient has some degree of chronic anemia due to endstage kidney disease with baseline hemoglobin around 10, on admission hemoglobin was 7.3, hemoglobin dropped to 6.6 prior to transfusion  2)Atrial fibrillation-continue Cardizem for rate control, Coumadin on hold due to #1 above  3) ESRD- stable, continue  TTS HD, EPO agent as per nephrologist, avoid heparin  4)H/o COPD- stable  5)Dispo- okay to discharge home in 1-2 days if okay with GI service and if H&H is stable  Code Status : Full   Disposition Plan  : home  Consults  :  Gi/PCCM/Nephrology   DVT Prophylaxis  :   SCDs   Lab Results  Component Value Date   PLT 164 02/21/2017    Inpatient Medications  Scheduled Meds: . diltiazem  120 mg Oral QPM  . diltiazem  240 mg Oral q morning - 10a  . pantoprazole  40 mg Oral BID  . sodium chloride flush  3 mL Intravenous Q12H   Continuous Infusions: PRN Meds:.acetaminophen, ALPRAZolam, ondansetron **OR** ondansetron (ZOFRAN) IV, phenol    Anti-infectives    None        Objective:   Vitals:   02/21/17  1330 02/21/17 1400 02/21/17 1442 02/21/17 1445  BP: (!) 106/57 (!) 106/58 (!) 113/51 115/67  Pulse: 75 94 64 69  Resp:   17   Temp:   98 F (36.7 C)   TempSrc:      SpO2:      Weight:   82.1 kg (181 lb)   Height:        Wt Readings from Last 3 Encounters:  02/21/17 82.1 kg (181 lb)  12/03/16 74.4 kg (164 lb)  11/10/16 71.4 kg (157 lb 6.5 oz)     Intake/Output Summary (Last 24 hours) at 02/21/17 1715 Last data filed at 02/21/17 1445  Gross per 24 hour  Intake              453 ml  Output             2000 ml  Net            -1547 ml     Physical Exam  Gen:- Awake Alert,  In no apparent distress  HEENT:- [place].AT, No sclera icterus Neck-Supple Neck,No JVD,.  Lungs-  CTAB  CV- S1, S2 normal,  irregularly irregular Abd-  +ve B.Sounds, Abd Soft, No tenderness,    Extremity/Skin:- No  edema,   SCD    Data Review:   Micro Results Recent Results (from the past 240 hour(s))  MRSA PCR Screening     Status: None   Collection Time: 02/16/17  2:12 PM  Result Value Ref Range Status   MRSA by PCR NEGATIVE NEGATIVE Final    Comment:        The GeneXpert MRSA Assay (FDA approved for NASAL specimens only), is one component of a comprehensive MRSA colonization surveillance program. It is not intended to diagnose MRSA infection nor to guide or monitor treatment for MRSA infections.     Radiology Reports Ct Head Wo Contrast  Result Date: 02/16/2017 CLINICAL DATA:  Headache since a fall 2 weeks ago. The patient is on Coumadin. EXAM: CT HEAD WITHOUT CONTRAST TECHNIQUE: Contiguous axial images were obtained from the base of the skull through the vertex without intravenous contrast. COMPARISON:  None. FINDINGS: Brain: There is some cortical atrophy and chronic microvascular ischemic change. No evidence of acute abnormality including hemorrhage, infarct, mass lesion, mass effect, midline shift or abnormal extra-axial fluid collections identified. No hydrocephalus or pneumocephalus. Vascular: Atherosclerosis noted. Skull: Intact. Sinuses/Orbits: No acute abnormality. Other: None. IMPRESSION: No acute abnormality. Mild atrophy and chronic microvascular ischemic change. Electronically Signed   By: Drusilla Kanner M.D.   On: 02/16/2017 09:07   Portable Chest Xray  Result Date: 02/18/2017 CLINICAL DATA:  Respiratory failure, shortness of breath. EXAM: PORTABLE CHEST 1 VIEW COMPARISON:  Radiograph of February 17, 2017. FINDINGS: Stable cardiomegaly. Atherosclerosis of thoracic aorta is noted. Endotracheal tube is in grossly good position. No pneumothorax is noted. Right internal jugular catheter is unchanged. Mild bibasilar edema or atelectasis is noted with possible associated pleural effusions. Bony  thorax is unremarkable. IMPRESSION: Aortic atherosclerosis. Stable support apparatus. Mild bibasilar atelectasis or edema with possible associated pleural effusions. Electronically Signed   By: Lupita Raider, M.D.   On: 02/18/2017 07:34   Dg Chest Port 1 View  Result Date: 02/17/2017 CLINICAL DATA:  Respiratory failure.  Status post intubation today. EXAM: PORTABLE CHEST 1 VIEW COMPARISON:  Single-view of the chest earlier today. FINDINGS: New endotracheal tube is in place with the tip projecting in good position at the level of the clavicular heads. Right  IJ catheter is again seen. NG tube has been removed. Left basilar airspace disease is unchanged. There is cardiomegaly. No pneumothorax. IMPRESSION: Endotracheal tube projects in good position. No change in left basilar airspace disease. Cardiomegaly. Atherosclerosis. Electronically Signed   By: Drusilla Kanner M.D.   On: 02/17/2017 11:31   Dg Chest Port 1 View  Result Date: 02/17/2017 CLINICAL DATA:  Central line placement EXAM: PORTABLE CHEST 1 VIEW COMPARISON:  02/16/2017 FINDINGS: The patient is rotated. Moderate-to-marked cardiomegaly as before. Hazy atelectasis or infiltrate at the left base. Suspect tiny pleural effusions. Atherosclerosis of the aorta. Esophageal tube tip is in the left upper quadrant. Right-sided central venous catheter tip overlies the cavoatrial junction. There is no pneumothorax. IMPRESSION: 1. Right-sided central venous catheter tip projects over the cavoatrial junction. No pneumothorax 2. Stable degree of cardiomegaly. Suspect tiny pleural effusions and hazy atelectasis or infiltrate at the left base. Electronically Signed   By: Jasmine Pang M.D.   On: 02/17/2017 01:00   Dg Chest Port 1 View  Result Date: 02/16/2017 CLINICAL DATA:  83 year old female undergoing nasogastric tube placement. EXAM: PORTABLE CHEST 1 VIEW COMPARISON:  Prior chest x-ray 11/08/2016 FINDINGS: Stable cardiomegaly. Mediastinal contours are  unchanged. Atherosclerotic calcifications again noted in the transverse aorta. A gastric tube is present. The tip of the tube overlies the distal esophagus. No focal airspace consolidation, pleural effusion or pneumothorax. No suspicious nodule or mass. Stable mild vascular congestion and chronic bronchitic changes. No acute osseous abnormality. IMPRESSION: 1. Stable chest x-ray without evidence of acute cardiopulmonary process. 2. The tip of a gastric tube projects over the distal esophagus. Recommend advancing 02-2009 cm for placement in the stomach. 3. Stable cardiomegaly. 4.  Aortic Atherosclerosis (ICD10-170.0) Electronically Signed   By: Malachy Moan M.D.   On: 02/16/2017 11:56   Dg Abd Portable 1v  Result Date: 02/16/2017 CLINICAL DATA:  Patient status post enteric tube placement. EXAM: PORTABLE ABDOMEN - 1 VIEW COMPARISON:  Chest radiograph 02/16/2017 FINDINGS: Enteric tube tip and side-port project over the left upper quadrant. Nonspecific nondilated loops of bowel within the central abdomen and right lower quadrant. Cholecystectomy clips. Thoracic spine lower lumbar spine degenerative changes. IMPRESSION: Enteric tube tip and side-port project over the stomach. Electronically Signed   By: Annia Belt M.D.   On: 02/16/2017 16:01     CBC  Recent Labs Lab 02/16/17 0741  02/17/17 0430 02/17/17 1430  02/18/17 0459 02/18/17 1635 02/19/17 0510 02/20/17 0450 02/21/17 1547  WBC 11.3*  --  11.0* 16.1*  --   --   --  7.7 8.3 13.3*  HGB 7.3*  < > 5.6* 9.5*  < > 9.0* 8.6* 7.7* 7.4* 8.4*  HCT 23.2*  < > 16.6* 27.7*  < > 26.1* 24.7* 22.6* 22.4* 26.4*  PLT 306  --  140* 138*  --   --   --  98* 116* 164  MCV 94.7  --  87.4 82.7  --   --   --  84.0 86.8 89.5  MCH 29.8  --  29.5 28.4  --   --   --  28.6 28.7 28.5  MCHC 31.5  --  33.7 34.3  --   --   --  34.1 33.0 31.8  RDW 15.3  --  15.2 16.5*  --   --   --  17.4* 18.3* 18.1*  LYMPHSABS 1.4  --   --   --   --   --   --   --   --   --  MONOABS 0.6  --   --   --   --   --   --   --   --   --   EOSABS 0.0  --   --   --   --   --   --   --   --   --   BASOSABS 0.0  --   --   --   --   --   --   --   --   --   < > = values in this interval not displayed.  Chemistries   Recent Labs Lab 02/16/17 0741  02/17/17 1333 02/17/17 1430 02/17/17 2023 02/18/17 0459 02/19/17 0511 02/20/17 0450  NA 137  < >  --  138 138 137 137 135  K 4.4  < >  --  4.1 5.2* 2.9* 4.6 4.0  CL 102  < >  --  112* 107 100* 101 97*  CO2 21*  < >  --  14* 17* 25 23 28   GLUCOSE 129*  < >  --  166* 107* 107* 148* 147*  BUN 120*  < >  --  127* 150* 47* 76* 40*  CREATININE 6.70*  < >  --  6.44* 7.84* 3.06* 5.08* 3.31*  CALCIUM 8.4*  < >  --  7.6* 9.2 8.8* 8.8* 8.8*  MG  --   --  1.5*  --   --  1.7  --   --   AST 13*  --   --   --   --   --   --   --   ALT 13*  --   --   --   --   --   --   --   ALKPHOS 37*  --   --   --   --   --   --   --   BILITOT 1.6*  --   --   --   --   --   --   --   < > = values in this interval not displayed. ------------------------------------------------------------------------------------------------------------------ No results for input(s): CHOL, HDL, LDLCALC, TRIG, CHOLHDL, LDLDIRECT in the last 72 hours.  No results found for: HGBA1C ------------------------------------------------------------------------------------------------------------------ No results for input(s): TSH, T4TOTAL, T3FREE, THYROIDAB in the last 72 hours.  Invalid input(s): FREET3 ------------------------------------------------------------------------------------------------------------------ No results for input(s): VITAMINB12, FOLATE, FERRITIN, TIBC, IRON, RETICCTPCT in the last 72 hours.  Coagulation profile  Recent Labs Lab 02/17/17 1335 02/17/17 1827 02/17/17 2340 02/18/17 0500 02/19/17 0510  INR 1.50 1.47 1.31 1.12 1.19    No results for input(s): DDIMER in the last 72 hours.  Cardiac Enzymes  Recent Labs Lab 02/17/17 0430  02/17/17 1030 02/17/17 1430  TROPONINI 0.03* 0.04* 0.03*   ------------------------------------------------------------------------------------------------------------------    Component Value Date/Time   BNP 1,962.0 (H) 11/01/2016 8088     Mariea Clonts, Leslie Jester M.D on 02/21/2017 at 5:15 PM  Between 7am to 7pm - Pager - [phone]  After 7pm go to [email or web address] - password [email or web address]  [facility] -  Office  [phone]  Dragon dictation system was used to create this note, attempts have been made to correct errors, however presence of uncorrected errors is not a reflection quality of care provided

## 2017-02-21 NOTE — Progress Notes (Signed)
Patient arrived to unit per bed.  Reviewed treatment plan and this RN agrees.  Report received from bedside RN, Bonita Quin.  Consent obtained.  Patient A & O X 4. Lung sounds diminished to ausculation in all fields. Generalized +1 edema. Cardiac: Afib.  Prepped LUAVF with alcohol and cannulated with two 15 gauge needles.  Pulsation of blood noted.  Flushed access well with saline per protocol.  Connected and secured lines and initiated tx at 1113.  UF goal of 2500 mL and net fluid removal of 2000 mL.  Will continue to monitor.

## 2017-02-22 ENCOUNTER — Ambulatory Visit: Payer: Medicare Other | Admitting: Cardiovascular Disease

## 2017-02-22 ENCOUNTER — Inpatient Hospital Stay
Admission: RE | Admit: 2017-02-22 | Discharge: 2017-03-15 | Disposition: A | Discharge status: Home / Self Care | Payer: Medicare Other | Source: Ambulatory Visit | Attending: Internal Medicine | Admitting: Internal Medicine

## 2017-02-22 DIAGNOSIS — Z9289 Personal history of other medical treatment: Secondary | ICD-10-CM | POA: Diagnosis not present

## 2017-02-22 DIAGNOSIS — I10 Essential (primary) hypertension: Secondary | ICD-10-CM | POA: Diagnosis not present

## 2017-02-22 DIAGNOSIS — K921 Melena: Secondary | ICD-10-CM | POA: Diagnosis not present

## 2017-02-22 DIAGNOSIS — M25512 Pain in left shoulder: Secondary | ICD-10-CM | POA: Diagnosis not present

## 2017-02-22 DIAGNOSIS — N2581 Secondary hyperparathyroidism of renal origin: Secondary | ICD-10-CM | POA: Diagnosis not present

## 2017-02-22 DIAGNOSIS — E785 Hyperlipidemia, unspecified: Secondary | ICD-10-CM | POA: Diagnosis not present

## 2017-02-22 DIAGNOSIS — I5033 Acute on chronic diastolic (congestive) heart failure: Secondary | ICD-10-CM | POA: Diagnosis not present

## 2017-02-22 DIAGNOSIS — D631 Anemia in chronic kidney disease: Secondary | ICD-10-CM | POA: Diagnosis not present

## 2017-02-22 DIAGNOSIS — K254 Chronic or unspecified gastric ulcer with hemorrhage: Secondary | ICD-10-CM | POA: Diagnosis not present

## 2017-02-22 DIAGNOSIS — I482 Chronic atrial fibrillation: Secondary | ICD-10-CM | POA: Diagnosis not present

## 2017-02-22 DIAGNOSIS — N186 End stage renal disease: Secondary | ICD-10-CM | POA: Diagnosis not present

## 2017-02-22 DIAGNOSIS — K920 Hematemesis: Secondary | ICD-10-CM | POA: Diagnosis not present

## 2017-02-22 DIAGNOSIS — R5381 Other malaise: Secondary | ICD-10-CM | POA: Diagnosis not present

## 2017-02-22 DIAGNOSIS — Z9981 Dependence on supplemental oxygen: Secondary | ICD-10-CM | POA: Diagnosis not present

## 2017-02-22 DIAGNOSIS — J42 Unspecified chronic bronchitis: Secondary | ICD-10-CM | POA: Diagnosis not present

## 2017-02-22 DIAGNOSIS — I5032 Chronic diastolic (congestive) heart failure: Secondary | ICD-10-CM | POA: Diagnosis not present

## 2017-02-22 DIAGNOSIS — R451 Restlessness and agitation: Secondary | ICD-10-CM | POA: Diagnosis not present

## 2017-02-22 DIAGNOSIS — M542 Cervicalgia: Secondary | ICD-10-CM | POA: Diagnosis not present

## 2017-02-22 DIAGNOSIS — D649 Anemia, unspecified: Secondary | ICD-10-CM | POA: Diagnosis not present

## 2017-02-22 DIAGNOSIS — I350 Nonrheumatic aortic (valve) stenosis: Secondary | ICD-10-CM | POA: Diagnosis not present

## 2017-02-22 DIAGNOSIS — I4891 Unspecified atrial fibrillation: Secondary | ICD-10-CM | POA: Diagnosis not present

## 2017-02-22 DIAGNOSIS — E876 Hypokalemia: Secondary | ICD-10-CM | POA: Diagnosis not present

## 2017-02-22 DIAGNOSIS — M25511 Pain in right shoulder: Secondary | ICD-10-CM | POA: Diagnosis not present

## 2017-02-22 DIAGNOSIS — B9681 Helicobacter pylori [H. pylori] as the cause of diseases classified elsewhere: Secondary | ICD-10-CM | POA: Diagnosis not present

## 2017-02-22 DIAGNOSIS — K922 Gastrointestinal hemorrhage, unspecified: Secondary | ICD-10-CM | POA: Diagnosis not present

## 2017-02-22 DIAGNOSIS — K219 Gastro-esophageal reflux disease without esophagitis: Secondary | ICD-10-CM | POA: Diagnosis not present

## 2017-02-22 DIAGNOSIS — M6281 Muscle weakness (generalized): Secondary | ICD-10-CM | POA: Diagnosis not present

## 2017-02-22 DIAGNOSIS — D62 Acute posthemorrhagic anemia: Secondary | ICD-10-CM | POA: Diagnosis not present

## 2017-02-22 DIAGNOSIS — I313 Pericardial effusion (noninflammatory): Secondary | ICD-10-CM | POA: Diagnosis not present

## 2017-02-22 DIAGNOSIS — R262 Difficulty in walking, not elsewhere classified: Secondary | ICD-10-CM | POA: Diagnosis not present

## 2017-02-22 DIAGNOSIS — I120 Hypertensive chronic kidney disease with stage 5 chronic kidney disease or end stage renal disease: Secondary | ICD-10-CM | POA: Diagnosis not present

## 2017-02-22 DIAGNOSIS — Z992 Dependence on renal dialysis: Secondary | ICD-10-CM | POA: Diagnosis not present

## 2017-02-22 LAB — RENAL FUNCTION PANEL
ANION GAP: 11 (ref 5–15)
Albumin: 2.4 g/dL — ABNORMAL LOW (ref 3.5–5.0)
BUN: 33 mg/dL — ABNORMAL HIGH (ref 6–20)
CALCIUM: 8.6 mg/dL — AB (ref 8.9–10.3)
CHLORIDE: 99 mmol/L — AB (ref 101–111)
CO2: 29 mmol/L (ref 22–32)
Creatinine, Ser: 3.64 mg/dL — ABNORMAL HIGH (ref 0.44–1.00)
GFR calc non Af Amer: 10 mL/min — ABNORMAL LOW (ref >60)
GFR, EST AFRICAN AMERICAN: 12 mL/min — AB (ref >60)
GLUCOSE: 88 mg/dL (ref 65–99)
Phosphorus: 5.3 mg/dL — ABNORMAL HIGH (ref 2.5–4.6)
Potassium: 3.6 mmol/L (ref 3.5–5.1)
SODIUM: 139 mmol/L (ref 135–145)

## 2017-02-22 MED ORDER — BISMUTH SUBSALICYLATE 262 MG PO CHEW: 524.0000 mg | CHEWABLE_TABLET | Freq: Four times a day (QID) | ORAL | 0 refills | Status: DC

## 2017-02-22 MED ORDER — METRONIDAZOLE 250 MG PO TABS
250.0000 mg | ORAL_TABLET | Freq: Four times a day (QID) | ORAL | Status: DC
Start: 2017-02-22 — End: 2017-03-08

## 2017-02-22 MED ORDER — DOXYCYCLINE HYCLATE 100 MG PO TABS: 100.0000 mg | ORAL_TABLET | Freq: Two times a day (BID) | ORAL | Status: DC

## 2017-02-22 MED ORDER — PANTOPRAZOLE SODIUM 40 MG PO TBEC: 40.0000 mg | DELAYED_RELEASE_TABLET | Freq: Two times a day (BID) | ORAL | 0 refills | Status: DC

## 2017-02-22 NOTE — Clinical Social Work Note (Signed)
Clinical Social Work Assessment  Patient Details  Name: Patricia Ayala MRN: [id number] Date of Birth: [birth year]  Date of referral:  02/22/17               Reason for consult:  Facility Placement, Discharge Planning                Permission sought to share information with:  Chartered certified accountant granted to share information::  Yes, Verbal Permission Granted  Name::        Agency::  SNF's  Relationship::     Contact Information:     Housing/Transportation Living arrangements for the past 2 months:  Single Family Home Source of Information:  Patient, Medical Team Patient Interpreter Needed:  None Criminal Activity/Legal Involvement Pertinent to Current Situation/Hospitalization:  No - Comment as needed Significant Relationships:  Adult Children, Spouse Lives with:  Spouse Do you feel safe going back to the place where you live?  Yes Need for family participation in patient care:  Yes (Comment)  Care giving concerns:  PT recommending SNF once medically stable for discharge.   Social Worker assessment / plan:  CSW met with patient. No supports at bedside. CSW introduced role and explained that PT recommendations would be discussed. Patient agreeable to SNF. First preference is [facility] as she has been there before. CSW left voicemail for admissions coordinator. CSW also sent referral to other facilities in the event they do not have a bed available. No further concerns. CSW encouraged patient to contact CSW as needed. CSW will continue to follow patient for support and facilitate discharge to SNF once medically stable.  Employment status:  Retired Nurse, adult PT Recommendations:  [place] / Referral to community resources:  [place]  Patient/Family's Response to care:  Patient agreeable to SNF placement. Patient's husband and daughter supportive and involved in patient's care.  Patient appreciated social work intervention.  Patient/Family's Understanding of and Emotional Response to Diagnosis, Current Treatment, and Prognosis:  Patient appears to have a good understanding of the reason for admission and PT recommendations. Patient appears happy with hospital care.  Emotional Assessment Appearance:  Appears stated age Attitude/Demeanor/Rapport:  Other (Pleasant) Affect (typically observed):  Accepting, Appropriate, Calm, Pleasant Orientation:  Oriented to Self, Oriented to Place, Oriented to  Time, Oriented to Situation Alcohol / Substance use:  Never Used Psych involvement (Current and /or in the community):  No (Comment)  Discharge Needs  Concerns to be addressed:  Care Coordination Readmission within the last 30 days:  No Current discharge risk:  Dependent with Mobility Barriers to Discharge:  No Barriers Identified   Candie Chroman, LCSW 02/22/2017, 12:22 PM

## 2017-02-22 NOTE — Clinical Social Work Placement (Signed)
   CLINICAL SOCIAL WORK PLACEMENT  NOTE  Date:  02/22/2017  Patient Details  Name: Patricia Ayala MRN: [id number] Date of Birth: 1929-07-16  Clinical Social Work is seeking post-discharge placement for this patient at the Skilled  Nursing Facility level of care (*CSW will initial, date and re-position this form in  chart as items are completed):  Yes   Patient/family provided with [place] Clinical Social Work Department's list of facilities offering this level of care within the geographic area requested by the patient (or if unable, by the patient's family).  Yes   Patient/family informed of their freedom to choose among providers that offer the needed level of care, that participate in Medicare, Medicaid or managed care program needed by the patient, have an available bed and are willing to accept the patient.  Yes   Patient/family informed of [place]'s ownership interest in [facility] and [facility], as well as of the fact that they are under no obligation to receive care at these facilities.  PASRR submitted to EDS on 02/22/17     PASRR number received on       Existing PASRR number confirmed on 02/22/17     FL2 transmitted to all facilities in geographic area requested by pt/family on 02/22/17     FL2 transmitted to all facilities within larger geographic area on       Patient informed that his/her managed care company has contracts with or will negotiate with certain facilities, including the following:        Yes   Patient/family informed of bed offers received.  Patient chooses bed at [facility]     Physician recommends and patient chooses bed at      Patient to be transferred to [facility] on 02/22/17.  Patient to be transferred to facility by [facility]     Patient family notified on 02/22/17 of transfer.  Name of family member notified:  Acquanetta Chain     PHYSICIAN Please prepare prescriptions     Additional Comment:     _______________________________________________ Margarito Liner, LCSW 02/22/2017, 2:22 PM

## 2017-02-22 NOTE — Consult Note (Signed)
   [facility] CM Inpatient Consult   02/22/2017  Patricia Ayala 1929-01-16 [id number]   Patient evaluated for multiple hospitalizations in the past 6 months in the [place].  Chart review reveals the patient is a 83 y/o female transferred from [facility] due to large volume black stools recently started on Coumadin. Intubated 4/22 for EGD. EGD-intubated 4/22 to allow for EGD-showed gastric ulcer with visible vessel, red blood in the gastric body/fundus, the ulcer was injected, clipped and treated with cautery. Extubated 4/24. PMH includes ESRD on HD TTS, HTN, A-fib, HLD, CHF, anxiety, asthma. Met with the patient at the bedside.   Patient's primary care provider is Dr. Redmond School was confirmed. Patient states she is to go to [facility] for rehab today and per inpatient social worker, Judson Roch.  Patient states she has no problems at home with her husband. She has her medications, transportation, and goes to dialysis 3 days a week.  A brochure with [place] [facility] contact information was given. No needs noted at this time.  For questions, please contact:  Natividad Brood, RN BSN [facility] Liaison  [phone] business mobile phone Toll free office [phone]

## 2017-02-22 NOTE — Clinical Social Work Note (Signed)
CSW facilitated patient discharge including contacting patient family and facility to confirm patient discharge plans. Clinical information faxed to facility and family agreeable with plan. CSW arranged ambulance transport via [facility] to [facility]. RN to call report prior to discharge ([phone]).  CSW will sign off for now as social work intervention is no longer needed. Please consult us again if new needs arise.  Reylene Stauder, CSW [phone]   

## 2017-02-22 NOTE — Clinical Social Work Placement (Signed)
   CLINICAL SOCIAL WORK PLACEMENT  NOTE  Date:  02/22/2017  Patient Details  Name: Patricia Ayala MRN: [id number] Date of Birth: [birth year]  Clinical Social Work is seeking post-discharge placement for this patient at the Skilled  Nursing Facility level of care (*CSW will initial, date and re-position this form in  chart as items are completed):  Yes   Patient/family provided with [place] Clinical Social Work Department's list of facilities offering this level of care within the geographic area requested by the patient (or if unable, by the patient's family).  Yes   Patient/family informed of their freedom to choose among providers that offer the needed level of care, that participate in Medicare, Medicaid or managed care program needed by the patient, have an available bed and are willing to accept the patient.  Yes   Patient/family informed of [place]'s ownership interest in [facility] and [facility], as well as of the fact that they are under no obligation to receive care at these facilities.  PASRR submitted to EDS on 02/22/17     PASRR number received on       Existing PASRR number confirmed on 02/22/17     FL2 transmitted to all facilities in geographic area requested by pt/family on 02/22/17     FL2 transmitted to all facilities within larger geographic area on       Patient informed that his/her managed care company has contracts with or will negotiate with certain facilities, including the following:            Patient/family informed of bed offers received.  Patient chooses bed at       Physician recommends and patient chooses bed at      Patient to be transferred to   on  .  Patient to be transferred to facility by       Patient family notified on   of transfer.  Name of family member notified:        PHYSICIAN Please sign FL2     Additional Comment:    _______________________________________________ Margarito Liner, LCSW 02/22/2017,  12:24 PM

## 2017-02-22 NOTE — Care Management Important Message (Signed)
Important Message  Patient Details  Name: Patricia Ayala MRN: [id number] Date of Birth: Jan 30, 1929   Medicare Important Message Given:  Yes    Kyla Balzarine 02/22/2017, 2:40 PM

## 2017-02-22 NOTE — [place] FL2 (Signed)
[place] MEDICAID FL2 LEVEL OF CARE SCREENING TOOL     IDENTIFICATION  Patient Name: Patricia Ayala Birthdate: May 04, 1929 Sex: female Admission Date (Current Location): 02/16/2017  [facility] and [place] Number:  [employer] and Address:  [facility] [place], [address], [place], [place] [address]      Provider Number: [id number]  Attending Physician Name and Address:  Rhetta Mura, MD  Relative Name and Phone Number:       Current Level of Care: Hospital Recommended Level of Care: Skilled Nursing Facility Prior Approval Number:    Date Approved/Denied:   PASRR Number: [id number]  Discharge Plan: SNF    Current Diagnoses: Patient Active Problem List   Diagnosis Date Noted  . Helicobacter pylori ab+   . Chronic gastric ulcer with hemorrhage   . Acute post-traumatic headache, not intractable   . GIB (gastrointestinal bleeding) 02/16/2017  . Melena   . Hematemesis   . Acute blood loss anemia   . Chronic anticoagulation   . Encounter for therapeutic drug monitoring 12/03/2016  . Generalized anxiety disorder 11/12/2016  . Atrial fibrillation with RVR (HCC) 11/01/2016  . Acute respiratory failure (HCC) 11/01/2016  . URI (upper respiratory infection) 10/30/2016  . ESRD (end stage renal disease) (HCC) 10/29/2016  . Benign essential HTN 10/29/2016  . GERD (gastroesophageal reflux disease) 10/29/2016  . HLD (hyperlipidemia) 10/29/2016  . Acute on chronic diastolic CHF (congestive heart failure) (HCC) 10/29/2016  . Pulmonary edema 09/26/2016  . Mechanical complication of other vascular device, implant, and graft 08/07/2013  . Anemia 02/11/2012    Orientation RESPIRATION BLADDER Height & Weight     Self, Time, Situation, Place  Normal Continent Weight: 172 lb 14.4 oz (78.4 kg) Height:  5\' 4"  (162.6 cm)  BEHAVIORAL SYMPTOMS/MOOD NEUROLOGICAL BOWEL NUTRITION STATUS   (None)  (None) Continent Diet (Soft)  AMBULATORY STATUS  COMMUNICATION OF NEEDS Skin   Limited Assist Verbally Normal                       Personal Care Assistance Level of Assistance              Functional Limitations Info  Sight, Hearing, Speech Sight Info: Adequate Hearing Info: Adequate Speech Info: Adequate    SPECIAL CARE FACTORS FREQUENCY  PT (By licensed PT), Blood pressure     PT Frequency: 5 x week              Contractures Contractures Info: Not present    Additional Factors Info  Code Status, Allergies, Psychotropic Code Status Info: Full Allergies Info: NKDA Psychotropic Info: Anxiety: Xanax 0.5 mg PO QHS prn         Current Medications (02/22/2017):  This is the current hospital active medication list Current Facility-Administered Medications  Medication Dose Route Frequency Provider Last Rate Last Dose  . acetaminophen (TYLENOL) suppository 650 mg  650 mg Rectal Q4H PRN Jamie Kato, MD      . ALPRAZolam Prudy Feeler) tablet 0.5 mg  0.5 mg Oral QHS PRN Bethany Molt, DO   0.5 mg at 02/21/17 2336  . bismuth subsalicylate (PEPTO BISMOL) chewable tablet 524 mg  524 mg Oral QID Iva Boop, MD   524 mg at 02/22/17 1129  . diltiazem (CARDIZEM CD) 24 hr capsule 120 mg  120 mg Oral QPM Nyra Market, MD   120 mg at 02/21/17 1708  . diltiazem (CARDIZEM CD) 24 hr capsule 240 mg  240 mg Oral  q morning - 10a Nyra Market, MD   240 mg at 02/22/17 1124  . doxycycline (VIBRA-TABS) tablet 100 mg  100 mg Oral BID Iva Boop, MD   100 mg at 02/22/17 1124  . metroNIDAZOLE (FLAGYL) tablet 250 mg  250 mg Oral QID Iva Boop, MD   250 mg at 02/22/17 0250  . ondansetron (ZOFRAN) tablet 4 mg  4 mg Oral Q6H PRN Filbert Schilder, MD       Or  . ondansetron [facility]) injection 4 mg  4 mg Intravenous Q6H PRN Filbert Schilder, MD   4 mg at 02/20/17 0551  . pantoprazole (PROTONIX) EC tablet 40 mg  40 mg Oral BID Dianah Field, PA-C   40 mg at 02/22/17 1125  . phenol (CHLORASEPTIC) mouth spray 1 spray  1 spray  Mouth/Throat PRN Nyra Market, MD      . sodium chloride flush (NS) 0.9 % injection 3 mL  3 mL Intravenous Q12H Filbert Schilder, MD   3 mL at 02/22/17 1127     Discharge Medications: Please see discharge summary for a list of discharge medications.  Relevant Imaging Results:  Relevant Lab Results:   Additional Information SS#: [id number]. HD TTS in [place] with Dr. Kristian Covey.  Margarito Liner, LCSW

## 2017-02-22 NOTE — Progress Notes (Signed)
Patient discharged to SNF.

## 2017-02-22 NOTE — Clinical Social Work Note (Signed)
[facility] can accept patient today. Per husband, she gets dialysis TTS at [facility] on [address] in [place]. Patient's husband will transport her to appointments. Patient will need PTAR. CSW paged MD to let him know that SNF can take her today.  Charlynn Court, CSW [phone]

## 2017-02-22 NOTE — Evaluation (Signed)
Physical Therapy Evaluation Patient Details Name: Patricia Ayala MRN: [id number] DOB: 10/31/1928 Today's Date: 02/22/2017   History of Present Illness  Patient is a 83 y/o female transferred from [facility] due to large volume black stools recently started on Coumadin. Intubated 4/22 for EGD. EGD-intubated 4/22 to allow for EGD-showed gastric ulcer with visible vessel, red blood in the gastric body/fundus, the ulcer was injected, clipped and treated with cautery. Extubated 4/24. PMH includes ESRD on HD TTS, HTN, A-fib, HLD, CHF, anxiety, asthma.  Clinical Impression  Patient presents with generalized weakness, tachycardia with HR in A-fib, and impaired mobility s/p above. Tolerated standing and SPT to chair with min guard assist for safety. Further mobility limited due to HR up to 182 bpm. Lengthy discussion with pt and pt's spouse about disposition. Pt would benefit from further therapy post discharge from hospital at skilled level. Family possibly interested in returning to facility close to home in [place]. If they decide against SNF, recommend HHPT. Pt independent PTA and has support of spouse. Will follow acutely to maximize independence and mobility.    Follow Up Recommendations SNF;Supervision for mobility/OOB    Equipment Recommendations  3in1 (PT)    Recommendations for Other Services       Precautions / Restrictions Precautions Precautions: Fall Precaution Comments: watch HR Restrictions Weight Bearing Restrictions: No      Mobility  Bed Mobility Overal bed mobility: Needs Assistance Bed Mobility: Supine to Sit     Supine to sit: Min guard;HOB elevated     General bed mobility comments: Increased time to get to EOB with use of rail but no assist needed.  Transfers Overall transfer level: Needs assistance Equipment used: Rolling walker (2 wheeled) Transfers: Sit to/from Stand Sit to Stand: Min guard         General transfer comment: Min guard for safety; stood  fromE OB x2, transferred to chair. HR up to 182 bpm in standing.  Ambulation/Gait             General Gait Details: Deferred secondary to A-fib with RVR  Stairs            Wheelchair Mobility    Modified Rankin (Stroke Patients Only)       Balance Overall balance assessment: Needs assistance Sitting-balance support: Feet supported;No upper extremity supported Sitting balance-Leahy Scale: Fair     Standing balance support: During functional activity;Bilateral upper extremity supported Standing balance-Leahy Scale: Poor Standing balance comment: Reliant on BUEs for support in standing.                             Pertinent Vitals/Pain Pain Assessment: No/denies pain    Home Living Family/patient expects to be discharged to:: Private residence Living Arrangements: Spouse/significant other Available Help at Discharge: Family;Available 24 hours/day Type of Home: House Home Access: Stairs to enter   [employer] of Steps: One step at the front.   Home Layout: One level Home Equipment: Walker - standard;Shower seat;Walker - 2 wheels      Prior Function Level of Independence: Independent         Comments: Recently d/ced from inpatient rehab a few months ago. Cooks, cleans, cares for self. Goes shopping.     Hand Dominance   Dominant Hand: Right    Extremity/Trunk Assessment   Upper Extremity Assessment Upper Extremity Assessment: Defer to OT evaluation    Lower Extremity Assessment Lower Extremity Assessment: Generalized weakness  Communication   Communication: HOH  Cognition Arousal/Alertness: Awake/alert Behavior During Therapy: WFL for tasks assessed/performed Overall Cognitive Status: Within Functional Limits for tasks assessed                                 General Comments: Does not recall any of hospital events.      General Comments General comments (skin integrity, edema, etc.): HR ranged  from 115-182 bpm during session. Pt in A-fib    Exercises     Assessment/Plan    PT Assessment Patient needs continued PT services  PT Problem List Decreased strength;Decreased mobility;Decreased activity tolerance;Decreased balance;Cardiopulmonary status limiting activity       PT Treatment Interventions DME instruction;Therapeutic activities;Gait training;Therapeutic exercise;Patient/family education;Balance training;Functional mobility training;Stair training    PT Goals (Current goals can be found in the Care Plan section)  Acute Rehab PT Goals Patient Stated Goal: to get better PT Goal Formulation: With patient/family Time For Goal Achievement: 03/08/17 Potential to Achieve Goals: Good    Frequency Min 3X/week   Barriers to discharge        Co-evaluation               End of Session Equipment Utilized During Treatment: Gait belt Activity Tolerance: Treatment limited secondary to medical complications (Comment) (tachycardia and A-fib) Patient left: in chair;with call bell/phone within reach;with family/visitor present;with chair alarm set Nurse Communication: Mobility status PT Visit Diagnosis: Unsteadiness on feet (R26.81);Muscle weakness (generalized) (M62.81)    Time: 1610-9604 PT Time Calculation (min) (ACUTE ONLY): 26 min   Charges:   PT Evaluation $PT Eval Moderate Complexity: 1 Procedure PT Treatments $Therapeutic Activity: 8-22 mins   PT G Codes:        Mylo Red, PT, DPT [phone]    Blake Divine A Ezmae Speers 02/22/2017, 10:41 AM

## 2017-02-22 NOTE — Discharge Summary (Signed)
Physician Discharge Summary  Patricia Ayala ZOX:[id number] DOB: 04-22-1929 DOA: 02/16/2017  PCP: [name], MD  Admit date: 02/16/2017 Discharge date: 02/22/2017  Time spent: 40 minutes  Recommendations for Outpatient Follow-up:  1. Patient should be taken off of chronic anticoagulation with Coumadin because of life-threatening GI bleed necessitating endoscopy and clipping 2. Patient should complete therapy for H. pylori with doxycycline, Pepto-Bismol, and Flagyl on 03/07/17. 3. -Patient should probably have a test of clearance with the UA is rapid test subsequently 4. I would recommend discussion with her about cardiologist Dr. Harold Hedge on discharge from skilled facility to determine need for further anticoagulation 5. Patient is to dialyze TTS at [facility] in [place]    Discharge Diagnoses:  Principal Problem:   GIB (gastrointestinal bleeding) Active Problems:   Anemia   ESRD (end stage renal disease) (HCC)   Benign essential HTN   GERD (gastroesophageal reflux disease)   Acute on chronic diastolic CHF (congestive heart failure) (HCC)   Atrial fibrillation with RVR (HCC)   Melena   Hematemesis   Acute blood loss anemia   Chronic anticoagulation   Chronic gastric ulcer with hemorrhage   Acute post-traumatic headache, not intractable   Helicobacter pylori ab+   Discharge Condition: Improved   Diet recommendation: Soft diet   Filed Weights   02/21/17 1100 02/21/17 1442 02/21/17 2032  Weight: 84.1 kg (185 lb 6.5 oz) 82.1 kg (181 lb) 78.4 kg (172 lb 14.4 oz)    History of present illness:  83 year old female  Dialysis TTS HTN HLD Multiple recent admissions for atrial fibrillation with RVR -last time 11/10/2016  Presented to [facility] hospital initially on 02/16/2017 with tarry dark stools and diarrhea -hemoglobin was found to be 7.3 and 4.1 given subcutaneous vitamin K IV fluid and Protonix drip was started DDAVP was ordered and gastroenterology in critical care  ICU care of the patient at [facility]. Patient was intubated and had EGD Renal saw the patient and recommended continuation of her dialysis  On 02/18/2007. She had an EGD showing gastric ulcer with visible vessel and also was clipped and injected and treated with cautery she was ultimately extubated on 4/24  She received 5 units PRBC still had a dark soft stool on 02/22/2007  She stabilized over the course of time and it was found that her H. pylori serologies and biopsy was equivocal and therefore she was started on treatment Pepto-Bismol, doxycycline, Flagyl  She was still somewhat weak when evaluated and was felt should benefit skilled care and she was transferred to a nursing home She will need a close outpatient follow-up with both gastroenterology and her cardiologist in the near future to determine if she will ever be a candidate for Coumadin again  We'll still slightly tachycardic during hospitalization with rates in the 110s and it was felt that she can up titrate on her chronic meds as an outpatient  Discharge Exam: Vitals:   02/22/17 0510 02/22/17 0919  BP: 122/79 (!) 121/57  Pulse: (!) 115 (!) 108  Resp: 18 20  Temp: 98.6 F (37 C) 98.3 F (36.8 C)    General: Alert pleasant oriented no apparent distressar: S1-S2 irregularly irregularry: Clinically clear no added sound   Discharge Instructions   Discharge Instructions    Call MD for:  redness, tenderness, or signs of infection (pain, swelling, redness, odor or green/yellow discharge around incision site)    Complete by:  As directed    Diet - low sodium heart healthy  Complete by:  As directed    Increase activity slowly    Complete by:  As directed      Current Discharge Medication List    START taking these medications   Details  bismuth subsalicylate (PEPTO BISMOL) 262 MG chewable tablet Chew 2 tablets (524 mg total) by mouth 4 (four) times daily. Qty: 30 tablet, Refills: 0    doxycycline  (VIBRA-TABS) 100 MG tablet Take 1 tablet (100 mg total) by mouth 2 (two) times daily.    metroNIDAZOLE (FLAGYL) 250 MG tablet Take 1 tablet (250 mg total) by mouth 4 (four) times daily.    pantoprazole (PROTONIX) 40 MG tablet Take 1 tablet (40 mg total) by mouth 2 (two) times daily. Qty: 30 tablet, Refills: 0      CONTINUE these medications which have NOT CHANGED   Details  ALPRAZolam (XANAX) 0.5 MG tablet Take 0.5 mg by mouth 3 (three) times daily as needed for anxiety.    Biotin 1 MG CAPS Take 1 mg by mouth daily.    diltiazem (CARDIZEM CD) 120 MG 24 hr capsule Take 2 tablets in the am ( 240 mg)  And take 1 tablet (120 mg) at night Qty: 90 capsule, Refills: 3    ipratropium-albuterol (DUONEB) 0.5-2.5 (3) MG/3ML SOLN Take 3 mLs by nebulization every 6 (six) hours as needed. Qty: 360 mL, Refills: 0    lidocaine-prilocaine (EMLA) cream Apply 1 application topically as needed (for IV access).     metoprolol tartrate (LOPRESSOR) 25 MG tablet Take 1 tablet (25 mg total) by mouth 2 (two) times daily. Qty: 60 tablet, Refills: 0    multivitamin (RENA-VIT) TABS tablet Take 1 tablet by mouth daily.    pentoxifylline (TRENTAL) 400 MG CR tablet Take 400 mg by mouth 3 (three) times daily with meals.    promethazine (PHENERGAN) 25 MG tablet Take 25 mg by mouth every 6 (six) hours as needed for nausea or vomiting.    sevelamer carbonate (RENVELA) 800 MG tablet Take 1,600 mg by mouth 3 (three) times daily with meals. Take 800mg  with snacks daily.     simvastatin (ZOCOR) 10 MG tablet Take 10 mg by mouth daily.      STOP taking these medications     warfarin (COUMADIN) 5 MG tablet        No Known Allergies Follow-up Information    Charlie Pitter III, MD Follow up on 03/14/2017.   Specialty:  Gastroenterology Why:  11:30 appointment with GI MD for ulcer follow up.  Contact information: [address] [place] [address] [phone]            The results of  significant diagnostics from this hospitalization (including imaging, microbiology, ancillary and laboratory) are listed below for reference.    Significant Diagnostic Studies: Ct Head Wo Contrast  Result Date: 02/16/2017 CLINICAL DATA:  Headache since a fall 2 weeks ago. The patient is on Coumadin. EXAM: CT HEAD WITHOUT CONTRAST TECHNIQUE: Contiguous axial images were obtained from the base of the skull through the vertex without intravenous contrast. COMPARISON:  None. FINDINGS: Brain: There is some cortical atrophy and chronic microvascular ischemic change. No evidence of acute abnormality including hemorrhage, infarct, mass lesion, mass effect, midline shift or abnormal extra-axial fluid collections identified. No hydrocephalus or pneumocephalus. Vascular: Atherosclerosis noted. Skull: Intact. Sinuses/Orbits: No acute abnormality. Other: None. IMPRESSION: No acute abnormality. Mild atrophy and chronic microvascular ischemic change. Electronically Signed   By: Drusilla Kanner M.D.  On: 02/16/2017 09:07   Portable Chest Xray  Result Date: 02/18/2017 CLINICAL DATA:  Respiratory failure, shortness of breath. EXAM: PORTABLE CHEST 1 VIEW COMPARISON:  Radiograph of February 17, 2017. FINDINGS: Stable cardiomegaly. Atherosclerosis of thoracic aorta is noted. Endotracheal tube is in grossly good position. No pneumothorax is noted. Right internal jugular catheter is unchanged. Mild bibasilar edema or atelectasis is noted with possible associated pleural effusions. Bony thorax is unremarkable. IMPRESSION: Aortic atherosclerosis. Stable support apparatus. Mild bibasilar atelectasis or edema with possible associated pleural effusions. Electronically Signed   By: Lupita Raider, M.D.   On: 02/18/2017 07:34   Dg Chest Port 1 View  Result Date: 02/17/2017 CLINICAL DATA:  Respiratory failure.  Status post intubation today. EXAM: PORTABLE CHEST 1 VIEW COMPARISON:  Single-view of the chest earlier today. FINDINGS: New  endotracheal tube is in place with the tip projecting in good position at the level of the clavicular heads. Right IJ catheter is again seen. NG tube has been removed. Left basilar airspace disease is unchanged. There is cardiomegaly. No pneumothorax. IMPRESSION: Endotracheal tube projects in good position. No change in left basilar airspace disease. Cardiomegaly. Atherosclerosis. Electronically Signed   By: Drusilla Kanner M.D.   On: 02/17/2017 11:31   Dg Chest Port 1 View  Result Date: 02/17/2017 CLINICAL DATA:  Central line placement EXAM: PORTABLE CHEST 1 VIEW COMPARISON:  02/16/2017 FINDINGS: The patient is rotated. Moderate-to-marked cardiomegaly as before. Hazy atelectasis or infiltrate at the left base. Suspect tiny pleural effusions. Atherosclerosis of the aorta. Esophageal tube tip is in the left upper quadrant. Right-sided central venous catheter tip overlies the cavoatrial junction. There is no pneumothorax. IMPRESSION: 1. Right-sided central venous catheter tip projects over the cavoatrial junction. No pneumothorax 2. Stable degree of cardiomegaly. Suspect tiny pleural effusions and hazy atelectasis or infiltrate at the left base. Electronically Signed   By: Jasmine Pang M.D.   On: 02/17/2017 01:00   Dg Chest Port 1 View  Result Date: 02/16/2017 CLINICAL DATA:  83 year old female undergoing nasogastric tube placement. EXAM: PORTABLE CHEST 1 VIEW COMPARISON:  Prior chest x-ray 11/08/2016 FINDINGS: Stable cardiomegaly. Mediastinal contours are unchanged. Atherosclerotic calcifications again noted in the transverse aorta. A gastric tube is present. The tip of the tube overlies the distal esophagus. No focal airspace consolidation, pleural effusion or pneumothorax. No suspicious nodule or mass. Stable mild vascular congestion and chronic bronchitic changes. No acute osseous abnormality. IMPRESSION: 1. Stable chest x-ray without evidence of acute cardiopulmonary process. 2. The tip of a gastric  tube projects over the distal esophagus. Recommend advancing 02-2009 cm for placement in the stomach. 3. Stable cardiomegaly. 4.  Aortic Atherosclerosis (ICD10-170.0) Electronically Signed   By: Malachy Moan M.D.   On: 02/16/2017 11:56   Dg Abd Portable 1v  Result Date: 02/16/2017 CLINICAL DATA:  Patient status post enteric tube placement. EXAM: PORTABLE ABDOMEN - 1 VIEW COMPARISON:  Chest radiograph 02/16/2017 FINDINGS: Enteric tube tip and side-port project over the left upper quadrant. Nonspecific nondilated loops of bowel within the central abdomen and right lower quadrant. Cholecystectomy clips. Thoracic spine lower lumbar spine degenerative changes. IMPRESSION: Enteric tube tip and side-port project over the stomach. Electronically Signed   By: Annia Belt M.D.   On: 02/16/2017 16:01    Microbiology: Recent Results (from the past 240 hour(s))  MRSA PCR Screening     Status: None   Collection Time: 02/16/17  2:12 PM  Result Value Ref Range Status   MRSA by PCR NEGATIVE  NEGATIVE Final    Comment:        The GeneXpert MRSA Assay (FDA approved for NASAL specimens only), is one component of a comprehensive MRSA colonization surveillance program. It is not intended to diagnose MRSA infection nor to guide or monitor treatment for MRSA infections.      Labs: Basic Metabolic Panel:  Recent Labs Lab 02/17/17 1333  02/17/17 2023 02/18/17 0459 02/19/17 0511 02/20/17 0450 02/22/17 0454  NA  --   < > 138 137 137 135 139  K  --   < > 5.2* 2.9* 4.6 4.0 3.6  CL  --   < > 107 100* 101 97* 99*  CO2  --   < > 17* 25 23 28 29   GLUCOSE  --   < > 107* 107* 148* 147* 88  BUN  --   < > 150* 47* 76* 40* 33*  CREATININE  --   < > 7.84* 3.06* 5.08* 3.31* 3.64*  CALCIUM  --   < > 9.2 8.8* 8.8* 8.8* 8.6*  MG 1.5*  --   --  1.7  --   --   --   PHOS 5.1*  --   --  2.2* 6.1* 6.5* 5.3*  < > = values in this interval not displayed. Liver Function Tests:  Recent Labs Lab 02/16/17 0741  02/19/17 0511 02/20/17 0450 02/22/17 0454  AST 13*  --   --   --   ALT 13*  --   --   --   ALKPHOS 37*  --   --   --   BILITOT 1.6*  --   --   --   PROT 5.2*  --   --   --   ALBUMIN 2.4* 2.2* 2.2* 2.4*    Recent Labs Lab 02/16/17 0741  LIPASE 39   No results for input(s): AMMONIA in the last 168 hours. CBC:  Recent Labs Lab 02/16/17 0741  02/17/17 0430 02/17/17 1430  02/18/17 0459 02/18/17 1635 02/19/17 0510 02/20/17 0450 02/21/17 1547  WBC 11.3*  --  11.0* 16.1*  --   --   --  7.7 8.3 13.3*  NEUTROABS 9.3*  --   --   --   --   --   --   --   --   --   HGB 7.3*  < > 5.6* 9.5*  < > 9.0* 8.6* 7.7* 7.4* 8.4*  HCT 23.2*  < > 16.6* 27.7*  < > 26.1* 24.7* 22.6* 22.4* 26.4*  MCV 94.7  --  87.4 82.7  --   --   --  84.0 86.8 89.5  PLT 306  --  140* 138*  --   --   --  98* 116* 164  < > = values in this interval not displayed. Cardiac Enzymes:  Recent Labs Lab 02/17/17 0430 02/17/17 1030 02/17/17 1430  TROPONINI 0.03* 0.04* 0.03*   BNP: BNP (last 3 results)  Recent Labs  09/26/16 0629 10/29/16 0740 11/01/16 0655  BNP 1,570.0* 1,261.0* 1,962.0*    ProBNP (last 3 results) No results for input(s): PROBNP in the last 8760 hours.  CBG:  Recent Labs Lab 02/16/17 1621 02/16/17 1935 02/17/17 0024 02/17/17 0417  GLUCAP 94 87 105* 148*       Signed:  Rhetta Mura MD   [facility] 02/22/2017, 1:34 PM

## 2017-02-23 DIAGNOSIS — Z992 Dependence on renal dialysis: Secondary | ICD-10-CM | POA: Diagnosis not present

## 2017-02-23 DIAGNOSIS — N186 End stage renal disease: Secondary | ICD-10-CM | POA: Diagnosis not present

## 2017-02-25 ENCOUNTER — Encounter: Payer: Self-pay | Admitting: Internal Medicine

## 2017-02-25 ENCOUNTER — Non-Acute Institutional Stay (SKILLED_NURSING_FACILITY): Payer: Medicare Other | Admitting: Internal Medicine

## 2017-02-25 ENCOUNTER — Encounter (HOSPITAL_COMMUNITY)
Admission: RE | Admit: 2017-02-25 | Discharge: 2017-02-25 | Disposition: A | Discharge status: Home / Self Care | Payer: Medicare Other | Source: Skilled Nursing Facility | Attending: Internal Medicine | Admitting: Internal Medicine

## 2017-02-25 DIAGNOSIS — D62 Acute posthemorrhagic anemia: Secondary | ICD-10-CM | POA: Insufficient documentation

## 2017-02-25 DIAGNOSIS — K254 Chronic or unspecified gastric ulcer with hemorrhage: Secondary | ICD-10-CM

## 2017-02-25 DIAGNOSIS — N186 End stage renal disease: Secondary | ICD-10-CM | POA: Diagnosis not present

## 2017-02-25 DIAGNOSIS — I4891 Unspecified atrial fibrillation: Secondary | ICD-10-CM | POA: Insufficient documentation

## 2017-02-25 DIAGNOSIS — Z992 Dependence on renal dialysis: Secondary | ICD-10-CM | POA: Diagnosis not present

## 2017-02-25 LAB — BASIC METABOLIC PANEL
ANION GAP: 11 (ref 5–15)
BUN: 32 mg/dL — ABNORMAL HIGH (ref 6–20)
CALCIUM: 9.3 mg/dL (ref 8.9–10.3)
CHLORIDE: 99 mmol/L — AB (ref 101–111)
CO2: 28 mmol/L (ref 22–32)
Creatinine, Ser: 6.13 mg/dL — ABNORMAL HIGH (ref 0.44–1.00)
GFR calc non Af Amer: 6 mL/min — ABNORMAL LOW (ref >60)
GFR, EST AFRICAN AMERICAN: 6 mL/min — AB (ref >60)
GLUCOSE: 99 mg/dL (ref 65–99)
Potassium: 3.4 mmol/L — ABNORMAL LOW (ref 3.5–5.1)
Sodium: 138 mmol/L (ref 135–145)

## 2017-02-25 LAB — CBC WITH DIFFERENTIAL/PLATELET
BASOS ABS: 0 10*3/uL (ref 0.0–0.1)
BASOS PCT: 0 %
Eosinophils Absolute: 0.3 10*3/uL (ref 0.0–0.7)
Eosinophils Relative: 2 %
HEMATOCRIT: 29.3 % — AB (ref 36.0–46.0)
HEMOGLOBIN: 9.3 g/dL — AB (ref 12.0–15.0)
LYMPHS PCT: 8 %
Lymphs Abs: 1.3 10*3/uL (ref 0.7–4.0)
MCH: 29.5 pg (ref 26.0–34.0)
MCHC: 31.7 g/dL (ref 30.0–36.0)
MCV: 93 fL (ref 78.0–100.0)
MONO ABS: 1.2 10*3/uL — AB (ref 0.1–1.0)
MONOS PCT: 7 %
NEUTROS PCT: 83 %
Neutro Abs: 13.6 10*3/uL — ABNORMAL HIGH (ref 1.7–7.7)
Platelets: 345 10*3/uL (ref 150–400)
RBC: 3.15 MIL/uL — ABNORMAL LOW (ref 3.87–5.11)
RDW: 17.6 % — AB (ref 11.5–15.5)
WBC: 16.4 10*3/uL — ABNORMAL HIGH (ref 4.0–10.5)

## 2017-02-25 NOTE — Progress Notes (Signed)
Provider:  Einar Ayala Location:   [facility] Room Number: 144/P Place of Service:  SNF (31)  PCP: Patricia Smiles, MD Patient Care Team: Patricia Nevins, MD as PCP - General (Internal Medicine) Patricia Mast, MD as Attending Physician (Nephrology)  Extended Emergency Contact Information Primary Emergency Contact: Patricia Ayala,Patricia Ayala Address: [address]          [place], [place] [address] [place] Home Phone: [phone] Relation: Spouse Secondary Emergency Contact: Patricia Ayala, [place] [address] Patricia Ayala of [place] Home Phone: [phone] Relation: Daughter  Code Status: Full Code Goals of Care: Advanced Directive information Advanced Directives 02/25/2017  Does Patient Have a Medical Advance Directive? Yes  Type of Advance Directive (No Data)  Does patient want to make changes to medical advance directive? No - Patient declined  Would patient like information on creating a medical advance directive? No - Patient declined  Pre-existing out of facility DNR order (yellow form or pink MOST form) -      Chief Complaint  Patient presents with  . New Admit To SNF    HPI: Patient is a 83 y.o. female seen today for admission to SNF for therapy.  Patient has h/o Patient is ESRD due to Hypertensin on Hemodialysis TTS   Since 2013, Anemia , and Diastolic CHF  And recently diagnosed with New onset Atrial fibrillation in 01/18 and has been on Coumadin since then. She was in the facility for few weeks at this time and was discharged home with her Husband.  According to Patient she was having she was having severe pain in her Legs and she was taking almost 7-8 Aleve in a day. She came to hospital when she started feeling weak and dizzy. She was also passing Black tarry stool. Was admitted with HGB of 7.1 and INR of 4.1. She crashed in the hospital and had to be intubated. EGD done showed Gastric Ulcer with Visible vessel. It was  clipped and injected on 04/24. She also got transfused 5 units of PRBC. And she was taken off Coumadin. She also was positive for H pylori and was started onPepto-Bismol, doxycycline, Flagyl .  Patient is doing well. She says that she has had few more black tarry stool since she has been here. But overall she is feeling well. Walking again with the walker. Her appetite is coming back. No Abdominal pain. No Nausea or vomiting. Only complain she has is pain in both her legs which is worse after Therapy.  She is planning to go back to her house with her husband.  Past Medical History:  Diagnosis Date  . Anemia 02/11/2012  . Anxiety   . Atrial fibrillation (HCC)   . CHF (congestive heart failure) (HCC)   . Chronic kidney disease    not on dialysis yet, Tues, thurs, sat  . GERD (gastroesophageal reflux disease)   . H/O metabolic acidosis   . Hyperlipidemia   . Hyperparathyroidism, secondary (HCC)   . Hypertension    Patricia Ayala, Patricia Ayala, Patricia Ayala  . Oxygen dependent   . Vitamin D deficiency    Past Surgical History:  Procedure Laterality Date  . ABDOMINAL HYSTERECTOMY    . AV FISTULA PLACEMENT  04/01/2012   Procedure: ARTERIOVENOUS (AV) FISTULA CREATION;  Surgeon: Patricia Kerns, MD;  Location: [facility] OR;  Service: Vascular;  Laterality: Left;  . CHOLECYSTECTOMY    . COLON SURGERY     for a blockage  .  ESOPHAGOGASTRODUODENOSCOPY N/A 02/17/2017   Procedure: ESOPHAGOGASTRODUODENOSCOPY (EGD);  Surgeon: Patricia Rist, MD;  Location: [facility] ENDOSCOPY;  Service: Endoscopy;  Laterality: N/A;  Bedside case in ICU  . EYE SURGERY     bilateral cataracts, /w IOL - 2013  . FISTULOGRAM N/A 11/05/2014   Procedure: FISTULOGRAM;  Surgeon: Patricia Earthly, MD;  Location: [facility] CATH LAB;  Service: Cardiovascular;  Laterality: N/A;  . INSERTION OF DIALYSIS CATHETER  04/10/2012   Procedure: INSERTION OF DIALYSIS CATHETER;  Surgeon: Patricia Ochoa, MD;  Location: [facility] OR;  Service: Vascular;  Laterality: N/A;  Insertion  Diatek Catheter Right Internal Jugular  . LIGATION OF COMPETING BRANCHES OF ARTERIOVENOUS FISTULA  11/07/2012   Procedure: LIGATION OF COMPETING BRANCHES OF ARTERIOVENOUS FISTULA;  Surgeon: Patricia Hint, MD;  Location: [facility] OR;  Service: Vascular;  Laterality: Left;  Brachio/Cephalic Fistula  . SHUNTOGRAM N/A 11/03/2012   Procedure: Betsey Amen;  Surgeon: Patricia Hint, MD;  Location: [facility] CATH LAB;  Service: Cardiovascular;  Laterality: N/A;  . SHUNTOGRAM Left 03/09/2013   Procedure: Fistulogram;  Surgeon: Patricia Hertz, MD;  Location: [facility] CATH LAB;  Service: Cardiovascular;  Laterality: Left;  . SHUNTOGRAM Left 11/23/2013   Procedure: FISTULOGRAM;  Surgeon: Patricia Hertz, MD;  Location: [facility] CATH LAB;  Service: Cardiovascular;  Laterality: Left;    reports that she has never smoked. She has never used smokeless tobacco. She reports that she does not drink alcohol or use drugs. Social History   Social History  . Marital status: Married    Spouse name: N/A  . Number of children: N/A  . Years of education: N/A   Occupational History  . Not on file.   Social History Main Topics  . Smoking status: Never Smoker  . Smokeless tobacco: Never Used  . Alcohol use No  . Drug use: No  . Sexual activity: Not on file   Other Topics Concern  . Not on file   Social History Narrative  . No narrative on file    Functional Status Survey:    Family History  Problem Relation Age of Onset  . Stroke Mother   . Cancer Father   . Heart attack Father     Health Maintenance  Topic Date Due  . TETANUS/TDAP  11/21/1949  . DEXA SCAN  11/22/1995  . PNA vac Low Risk Adult (2 of 2 - PCV13) 04/10/2017  . INFLUENZA VACCINE  05/29/2017    No Known Allergies  Outpatient Encounter Prescriptions as of 02/25/2017  Medication Sig  . acetaminophen (TYLENOL) 325 MG tablet Take 650 mg by mouth every 4 (four) hours as needed.  . ALPRAZolam (XANAX) 0.5 MG tablet Take 0.5 mg by mouth 3 (three) times  daily as needed for anxiety.  . Biotin 1 MG CAPS Take 1 mg by mouth daily.  Marland Kitchen bismuth subsalicylate (PEPTO BISMOL) 262 MG chewable tablet Chew 2 tablets (524 mg total) by mouth 4 (four) times daily.  Marland Kitchen diltiazem (CARDIZEM CD) 120 MG 24 hr capsule Take 2 tablets in the am ( 240 mg)  And take 1 tablet (120 mg) at night  . doxycycline (VIBRA-TABS) 100 MG tablet Take 1 tablet (100 mg total) by mouth 2 (two) times daily.  Marland Kitchen ipratropium-albuterol (DUONEB) 0.5-2.5 (3) MG/3ML SOLN Take 3 mLs by nebulization every 6 (six) hours as needed.  . metoprolol tartrate (LOPRESSOR) 25 MG tablet Take 1 tablet (25 mg total) by mouth 2 (two) times daily.  . metroNIDAZOLE (FLAGYL) 250 MG  tablet Take 1 tablet (250 mg total) by mouth 4 (four) times daily.  . multivitamin (RENA-VIT) TABS tablet Take 1 tablet by mouth daily.  . pantoprazole (PROTONIX) 40 MG tablet Take 1 tablet (40 mg total) by mouth 2 (two) times daily.  . pentoxifylline (TRENTAL) 400 MG CR tablet Take 400 mg by mouth 3 (three) times daily with meals.  . promethazine (PHENERGAN) 25 MG tablet Take 25 mg by mouth every 6 (six) hours as needed for nausea or vomiting.  . sevelamer carbonate (RENVELA) 800 MG tablet Take 1,600 mg by mouth 3 (three) times daily with meals. Take 800mg  with snacks daily.   . simvastatin (ZOCOR) 10 MG tablet Take 10 mg by mouth daily.  . [DISCONTINUED] lidocaine-prilocaine (EMLA) cream Apply 1 application topically as needed (for IV access).    No facility-administered encounter medications on file as of 02/25/2017.      Review of Systems  Review of Systems  Constitutional: Negative for activity change, appetite change, chills, diaphoresis, fatigue and fever.  HENT: Negative for mouth sores, postnasal drip, rhinorrhea, sinus pain and sore throat.   Respiratory: Negative for apnea, cough, chest tightness, shortness of breath and wheezing.   Cardiovascular: Negative for chest pain, palpitations and leg swelling.    Gastrointestinal: Negative for abdominal distention, abdominal pain, constipation, diarrhea, nausea and vomiting.  Genitourinary: Negative for dysuria and frequency.  Musculoskeletal: Negative for arthralgias, joint swelling and myalgias.  Skin: Negative for rash.  Neurological: Negative for dizziness, syncope, weakness, light-headedness and numbness.  Psychiatric/Behavioral: Negative for behavioral problems, confusion and sleep disturbance.     Vitals:   02/25/17 1009  BP: 139/69  Pulse: 91  Resp: 20  Temp: 97.1 F (36.2 C)  TempSrc: Oral   There is no height or weight on file to calculate BMI. Physical Exam  Constitutional: She is oriented to person, place, and time. She appears well-developed and well-nourished.  HENT:  Head: Normocephalic.  Mouth/Throat: Oropharynx is clear and moist.  Eyes: Pupils are equal, round, and reactive to light.  Neck: Neck supple.  Cardiovascular: Normal rate and normal heart sounds.  An irregularly irregular rhythm present.  No murmur heard. Pulmonary/Chest: Effort normal and breath sounds normal. No respiratory distress. She has no wheezes. She has no rales.  Abdominal: Soft. Bowel sounds are normal. She exhibits no distension. There is no tenderness. There is no rebound.  Musculoskeletal:  Trace edema in her leg.  Lymphadenopathy:    She has no cervical adenopathy.  Neurological: She is alert and oriented to person, place, and time.  No focal deficit  Skin: Skin is warm and dry.  Psychiatric: She has a normal mood and affect. Her behavior is normal. Thought content normal.    Labs reviewed: Basic Metabolic Panel:  Recent Labs  00/71/21 1333  02/18/17 0459 02/19/17 0511 02/20/17 0450 02/22/17 0454  NA  --   < > 137 137 135 139  K  --   < > 2.9* 4.6 4.0 3.6  CL  --   < > 100* 101 97* 99*  CO2  --   < > 25 23 28 29   GLUCOSE  --   < > 107* 148* 147* 88  BUN  --   < > 47* 76* 40* 33*  CREATININE  --   < > 3.06* 5.08* 3.31* 3.64*   CALCIUM  --   < > 8.8* 8.8* 8.8* 8.6*  MG 1.5*  --  1.7  --   --   --  PHOS 5.1*  --  2.2* 6.1* 6.5* 5.3*  < > = values in this interval not displayed. Liver Function Tests:  Recent Labs  11/08/16 1904 11/09/16 0420 02/16/17 0741 02/19/17 0511 02/20/17 0450 02/22/17 0454  AST 23 18 13*  --   --   --   ALT 19 17 13*  --   --   --   ALKPHOS 66 53 37*  --   --   --   BILITOT 1.0 1.0 1.6*  --   --   --   PROT 7.8 6.4* 5.2*  --   --   --   ALBUMIN 3.2* 2.7* 2.4* 2.2* 2.2* 2.4*    Recent Labs  02/16/17 0741  LIPASE 39   No results for input(s): AMMONIA in the last 8760 hours. CBC:  Recent Labs  11/01/16 0655  11/08/16 1904  02/16/17 0741  02/19/17 0510 02/20/17 0450 02/21/17 1547  WBC 12.6*  < > 19.0*  < > 11.3*  < > 7.7 8.3 13.3*  NEUTROABS 10.5*  --  14.4*  --  9.3*  --   --   --   --   HGB 10.6*  < > 11.9*  < > 7.3*  < > 7.7* 7.4* 8.4*  HCT 33.8*  < > 37.7  < > 23.2*  < > 22.6* 22.4* 26.4*  MCV 96.0  < > 94.7  < > 94.7  < > 84.0 86.8 89.5  PLT 216  < > 530*  < > 306  < > 98* 116* 164  < > = values in this interval not displayed. Cardiac Enzymes:  Recent Labs  02/17/17 0430 02/17/17 1030 02/17/17 1430  TROPONINI 0.03* 0.04* 0.03*   BNP: Invalid input(s): POCBNP No results found for: HGBA1C Lab Results  Component Value Date   TSH 0.836 11/09/2016   No results found for: VITAMINB12 No results found for: FOLATE No results found for: IRON, TIBC, FERRITIN  Imaging and Procedures obtained prior to SNF admission: No results found.  Assessment/Plan Gastrointestinal hemorrhage associated with gastric ulcer Patient on Protonix. She still has some Black tarry stools. Her Hgb is stable. Follow up with Gastroenterologist in 5 weeks. H Pylori treatment continuing Repeat Hgb to follow  Atrial fibrillation with RVR  Patient rate is controlled here on Cardizem And metoprolol. ` She is off Coumadin right now. Follow up with cardiology and decision to be  made at later date about restarting the medicine.    ESRD (end stage renal disease)  Continue Dialysis on TTS Anemia Due to Chronic renal disease worsen y GI bleed. Will start heron iron in few days once her Tarry stools clear up.  Leg pain At this time it looks more like Claudication then Neuropathy. She is already on Pentoxifylline Will also start her on hydrocodone which she will need on chronic basis to aavoid Aleve. Generalized anxiety disorder Patient is on PRN Xanax. She says she uses it occassionally. Will keep her on it for 2 weeks and then Re Eval.  Family/ staff Communication:   Labs/tests ordered:

## 2017-02-26 DIAGNOSIS — Z992 Dependence on renal dialysis: Secondary | ICD-10-CM | POA: Diagnosis not present

## 2017-02-26 DIAGNOSIS — N186 End stage renal disease: Secondary | ICD-10-CM | POA: Diagnosis not present

## 2017-02-27 ENCOUNTER — Encounter: Payer: Self-pay | Admitting: Cardiovascular Disease

## 2017-02-27 ENCOUNTER — Ambulatory Visit (INDEPENDENT_AMBULATORY_CARE_PROVIDER_SITE_OTHER): Payer: Medicare Other | Admitting: Cardiovascular Disease

## 2017-02-27 VITALS — BP 144/80 | HR 97 | Ht 64.0 in | Wt 172.0 lb

## 2017-02-27 DIAGNOSIS — I10 Essential (primary) hypertension: Secondary | ICD-10-CM

## 2017-02-27 DIAGNOSIS — I3139 Other pericardial effusion (noninflammatory): Secondary | ICD-10-CM

## 2017-02-27 DIAGNOSIS — Z9289 Personal history of other medical treatment: Secondary | ICD-10-CM

## 2017-02-27 DIAGNOSIS — I313 Pericardial effusion (noninflammatory): Secondary | ICD-10-CM

## 2017-02-27 DIAGNOSIS — I350 Nonrheumatic aortic (valve) stenosis: Secondary | ICD-10-CM

## 2017-02-27 DIAGNOSIS — I482 Chronic atrial fibrillation, unspecified: Secondary | ICD-10-CM

## 2017-02-27 DIAGNOSIS — M79605 Pain in left leg: Secondary | ICD-10-CM | POA: Diagnosis not present

## 2017-02-27 NOTE — Progress Notes (Signed)
SUBJECTIVE: The patient presents for follow up of atrial fibrillation, aortic stenosis, and pericardial effusion. She has ESRD and had been anticoagulated with warfarin. She also has chronic nonocclusive DVT of the left popliteal and femoral veins.  Echocardiogram 11/09/16: Normal left ventricular systolic function, LVEF 55%, mild aortic stenosis, severe left atrial enlargement, small to moderate size pericardial effusion.  Hospitalized for a GI bleed in April requiring 5 units PRBC transfusion.  EGD showed gastric ulcer with visible vessel which was cauterized.  Warfarin was stopped.  She is feeling well. Her primary complaint relates to left leg pain.  She denies chest pain. She only has palpitations if she "rushes around ". She said she is able to carry out her activities of daily living such as housecleaning without feeling fatigued.   Review of Systems: As per "subjective", otherwise negative.  No Known Allergies  No current outpatient prescriptions on file.   No current facility-administered medications for this visit.     Past Medical History:  Diagnosis Date  . Anemia 02/11/2012  . Anxiety   . Atrial fibrillation (HCC)   . CHF (congestive heart failure) (HCC)   . Chronic kidney disease    not on dialysis yet, Tues, thurs, sat  . GERD (gastroesophageal reflux disease)   . H/O metabolic acidosis   . Hyperlipidemia   . Hyperparathyroidism, secondary (HCC)   . Hypertension    Dr. Regino Schultze, [place], [place]  . Oxygen dependent   . Vitamin D deficiency     Past Surgical History:  Procedure Laterality Date  . ABDOMINAL HYSTERECTOMY    . AV FISTULA PLACEMENT  04/01/2012   Procedure: ARTERIOVENOUS (AV) FISTULA CREATION;  Surgeon: Sherren Kerns, MD;  Location: [facility] OR;  Service: Vascular;  Laterality: Left;  . CHOLECYSTECTOMY    . COLON SURGERY     for a blockage  . ESOPHAGOGASTRODUODENOSCOPY N/A 02/17/2017   Procedure: ESOPHAGOGASTRODUODENOSCOPY (EGD);  Surgeon:  Sherrilyn Rist, MD;  Location: [facility] ENDOSCOPY;  Service: Endoscopy;  Laterality: N/A;  Bedside case in ICU  . EYE SURGERY     bilateral cataracts, /w IOL - 2013  . FISTULOGRAM N/A 11/05/2014   Procedure: FISTULOGRAM;  Surgeon: Larina Earthly, MD;  Location: [facility] CATH LAB;  Service: Cardiovascular;  Laterality: N/A;  . INSERTION OF DIALYSIS CATHETER  04/10/2012   Procedure: INSERTION OF DIALYSIS CATHETER;  Surgeon: Pryor Ochoa, MD;  Location: [facility] OR;  Service: Vascular;  Laterality: N/A;  Insertion Diatek Catheter Right Internal Jugular  . LIGATION OF COMPETING BRANCHES OF ARTERIOVENOUS FISTULA  11/07/2012   Procedure: LIGATION OF COMPETING BRANCHES OF ARTERIOVENOUS FISTULA;  Surgeon: Chuck Hint, MD;  Location: [facility] OR;  Service: Vascular;  Laterality: Left;  Brachio/Cephalic Fistula  . SHUNTOGRAM N/A 11/03/2012   Procedure: Betsey Amen;  Surgeon: Chuck Hint, MD;  Location: [facility] CATH LAB;  Service: Cardiovascular;  Laterality: N/A;  . SHUNTOGRAM Left 03/09/2013   Procedure: Fistulogram;  Surgeon: Fransisco Hertz, MD;  Location: [facility] CATH LAB;  Service: Cardiovascular;  Laterality: Left;  . SHUNTOGRAM Left 11/23/2013   Procedure: FISTULOGRAM;  Surgeon: Fransisco Hertz, MD;  Location: [facility] CATH LAB;  Service: Cardiovascular;  Laterality: Left;    Social History   Social History  . Marital status: Married    Spouse name: N/A  . Number of children: N/A  . Years of education: N/A   Occupational History  . Not on file.   Social History Main Topics  . Smoking  status: Never Smoker  . Smokeless tobacco: Never Used  . Alcohol use No  . Drug use: No  . Sexual activity: Not on file   Other Topics Concern  . Not on file   Social History Narrative  . No narrative on file     Vitals:   02/27/17 1351  BP: (!) 144/80  Pulse: 97  SpO2: 99%  Weight: 172 lb (78 kg)  Height:  (1.626 m)    Wt Readings from Last 3 Encounters:  02/27/17 172 lb (78 kg)  02/21/17 172 lb 14.4 oz (78.4  kg)  12/03/16 164 lb (74.4 kg)     PHYSICAL EXAM General: NAD HEENT: Normal. Neck: No JVD, no thyromegaly. Lungs: Clear to auscultation bilaterally with normal respiratory effort. CV: Nondisplaced PMI.  Regular rate and irregular rhythm, normal S1/S2, no S3, no murmur. No pretibial or periankle edema.    Abdomen: Soft, nontender, no distention.  Neurologic: Alert and oriented.  Psych: Normal affect. Skin: Normal. Musculoskeletal: No gross deformities.    ECG: Most recent ECG reviewed.   Labs: Lab Results  Component Value Date/Time   K 3.4 (L) 02/25/2017 09:00 AM   BUN 32 (H) 02/25/2017 09:00 AM   CREATININE 6.13 (H) 02/25/2017 09:00 AM   ALT 13 (L) 02/16/2017 07:41 AM   TSH 0.836 11/09/2016 09:44 AM   HGB 9.3 (L) 02/25/2017 09:00 AM     Lipids: Lab Results  Component Value Date/Time   TRIG 270 (H) 02/17/2017 02:30 PM       ASSESSMENT AND PLAN:  1. Chronic atrial fibrillation: Symptomatically stable on long-acting diltiazem 240 mg every morning and 120 mg every evening as well as metoprolol tartrate 25 mg twice daily. She is not on anticoagulation candidate.  2. Hypertension: Mildly elevated. Will monitor.  3. Aortic stenosis: Mild.  4. Pericardial effusion: Small to medium size.  5. Left leg pain: I will prescribe compression thigh high stockings 20-30 mmHg.   Disposition: Follow up 6 months  Prentice Docker, M.D., F.A.C.C.

## 2017-02-27 NOTE — Patient Instructions (Signed)
Your physician wants you to follow-up in: 6 Months with Dr. Purvis Sheffield. You will receive a reminder letter in the mail two months in advance. If you don't receive a letter, please call our office to schedule the follow-up appointment.  Your physician recommends that you continue on your current medications as directed. Please refer to the Current Medication list given to you today.  If you need a refill on your cardiac medications before your next appointment, please call your pharmacy.  You have been given a Rx for compression stockings   Thank you for choosing [facility]!

## 2017-02-28 ENCOUNTER — Encounter (HOSPITAL_COMMUNITY)
Admission: RE | Admit: 2017-02-28 | Discharge: 2017-02-28 | Disposition: A | Discharge status: Home / Self Care | Payer: Medicare Other | Source: Skilled Nursing Facility | Attending: Internal Medicine | Admitting: Internal Medicine

## 2017-02-28 DIAGNOSIS — Z992 Dependence on renal dialysis: Secondary | ICD-10-CM | POA: Insufficient documentation

## 2017-02-28 DIAGNOSIS — K920 Hematemesis: Secondary | ICD-10-CM | POA: Insufficient documentation

## 2017-02-28 DIAGNOSIS — N186 End stage renal disease: Secondary | ICD-10-CM | POA: Insufficient documentation

## 2017-02-28 DIAGNOSIS — I10 Essential (primary) hypertension: Secondary | ICD-10-CM | POA: Insufficient documentation

## 2017-02-28 DIAGNOSIS — N2581 Secondary hyperparathyroidism of renal origin: Secondary | ICD-10-CM | POA: Insufficient documentation

## 2017-02-28 DIAGNOSIS — K254 Chronic or unspecified gastric ulcer with hemorrhage: Secondary | ICD-10-CM | POA: Insufficient documentation

## 2017-02-28 DIAGNOSIS — M6281 Muscle weakness (generalized): Secondary | ICD-10-CM | POA: Insufficient documentation

## 2017-02-28 LAB — CBC
HCT: 27.8 % — ABNORMAL LOW (ref 36.0–46.0)
Hemoglobin: 8.8 g/dL — ABNORMAL LOW (ref 12.0–15.0)
MCH: 29.4 pg (ref 26.0–34.0)
MCHC: 31.7 g/dL (ref 30.0–36.0)
MCV: 93 fL (ref 78.0–100.0)
PLATELETS: 404 10*3/uL — AB (ref 150–400)
RBC: 2.99 MIL/uL — ABNORMAL LOW (ref 3.87–5.11)
RDW: 17.8 % — AB (ref 11.5–15.5)
WBC: 11.5 10*3/uL — ABNORMAL HIGH (ref 4.0–10.5)

## 2017-02-28 LAB — BASIC METABOLIC PANEL
Anion gap: 15 (ref 5–15)
BUN: 22 mg/dL — AB (ref 6–20)
CALCIUM: 9.5 mg/dL (ref 8.9–10.3)
CHLORIDE: 98 mmol/L — AB (ref 101–111)
CO2: 25 mmol/L (ref 22–32)
CREATININE: 6.59 mg/dL — AB (ref 0.44–1.00)
GFR, EST AFRICAN AMERICAN: 6 mL/min — AB (ref >60)
GFR, EST NON AFRICAN AMERICAN: 5 mL/min — AB (ref >60)
Glucose, Bld: 107 mg/dL — ABNORMAL HIGH (ref 65–99)
Potassium: 2.9 mmol/L — ABNORMAL LOW (ref 3.5–5.1)
SODIUM: 138 mmol/L (ref 135–145)

## 2017-03-01 ENCOUNTER — Non-Acute Institutional Stay (SKILLED_NURSING_FACILITY): Payer: Medicare Other | Admitting: Internal Medicine

## 2017-03-01 ENCOUNTER — Encounter: Payer: Self-pay | Admitting: Internal Medicine

## 2017-03-01 ENCOUNTER — Encounter (HOSPITAL_COMMUNITY)
Admission: RE | Admit: 2017-03-01 | Discharge: 2017-03-01 | Disposition: A | Discharge status: Home / Self Care | Payer: Medicare Other | Source: Skilled Nursing Facility | Attending: Internal Medicine | Admitting: Internal Medicine

## 2017-03-01 DIAGNOSIS — K254 Chronic or unspecified gastric ulcer with hemorrhage: Secondary | ICD-10-CM

## 2017-03-01 DIAGNOSIS — R262 Difficulty in walking, not elsewhere classified: Secondary | ICD-10-CM | POA: Insufficient documentation

## 2017-03-01 DIAGNOSIS — N186 End stage renal disease: Secondary | ICD-10-CM | POA: Insufficient documentation

## 2017-03-01 DIAGNOSIS — M6281 Muscle weakness (generalized): Secondary | ICD-10-CM | POA: Insufficient documentation

## 2017-03-01 DIAGNOSIS — N2581 Secondary hyperparathyroidism of renal origin: Secondary | ICD-10-CM | POA: Insufficient documentation

## 2017-03-01 DIAGNOSIS — K920 Hematemesis: Secondary | ICD-10-CM | POA: Insufficient documentation

## 2017-03-01 DIAGNOSIS — E876 Hypokalemia: Secondary | ICD-10-CM | POA: Diagnosis not present

## 2017-03-01 DIAGNOSIS — I10 Essential (primary) hypertension: Secondary | ICD-10-CM | POA: Insufficient documentation

## 2017-03-01 LAB — BASIC METABOLIC PANEL
ANION GAP: 12 (ref 5–15)
BUN: 15 mg/dL (ref 6–20)
CO2: 28 mmol/L (ref 22–32)
Calcium: 9.8 mg/dL (ref 8.9–10.3)
Chloride: 98 mmol/L — ABNORMAL LOW (ref 101–111)
Creatinine, Ser: 5.47 mg/dL — ABNORMAL HIGH (ref 0.44–1.00)
GFR calc non Af Amer: 6 mL/min — ABNORMAL LOW (ref >60)
GFR, EST AFRICAN AMERICAN: 7 mL/min — AB (ref >60)
GLUCOSE: 122 mg/dL — AB (ref 65–99)
POTASSIUM: 3 mmol/L — AB (ref 3.5–5.1)
Sodium: 138 mmol/L (ref 135–145)

## 2017-03-01 NOTE — Progress Notes (Signed)
Location:    [facility] Room Number: 144/P Place of Service:  SNF (31) Provider:  Angela Cox, MD  Patient Care Team: [name], MD as PCP - General (Internal Medicine) [name], MD as Attending Physician (Nephrology)  Extended Emergency Contact Information Primary Emergency Contact: Middlekauff,Willie Address: [address]          [place], [place] [address] [place] Home Phone: [phone] Relation: Spouse Secondary Emergency Contact: Gweneth Dimitri, [place] [address] [place] Home Phone: [phone] Relation: Daughter  Goals of care: Advanced Directive information Advanced Directives 03/01/2017  Does Patient Have a Medical Advance Directive? Yes  Type of Advance Directive (No Data)  Does patient want to make changes to medical advance directive? No - Patient declined  Would patient like information on creating a medical advance directive? No - Patient declined  Pre-existing out of facility DNR order (yellow form or pink MOST form) -     Chief Complaint  Patient presents with  . Acute Visit    Low Potassium    HPI:  Pt is a 83 y.o. female seen today for an acute visit for Follow-up of hypokalemia.  Patient has a history of end-stage renal disease-hypertension currently on hemodialysis.  Anemia diastolic CHF--  Recently diagnosed with new onset atrial fibrillation had been on Coumadin.  Patient did return home and started having pain in her legs she was taking a significant amount of Aleve.  She had increased weakness and went to the hospital with weakness and dizziness also passing black tarry stool-her hemoglobin was noted to be 7.1 INR 4.1.  She actually crashed in the hospital and had to be intubated.  EGD showed gastric ulcer with visible vessel was clipped and injected.  She also received transfusions.  Coumadin was discontinued.  She also was positive for H. pylori and was started  on Pepto-Bismol doxycycline and Flagyl.  She says she is doing relatively well still has some residual black tarry stools.  Does not complain of abdominal pain nausea or vomiting.  There was noted on recent lab work potassium was 2.9 lab was done yesterday May 3.  Updated lab today shows potassium is 3.0.  Her vital signs continued to be stable she has no acute complaints.  .       Past Medical History:  Diagnosis Date  . Anemia 02/11/2012  . Anxiety   . Atrial fibrillation (HCC)   . CHF (congestive heart failure) (HCC)   . Chronic kidney disease    not on dialysis yet, Tues, thurs, sat  . GERD (gastroesophageal reflux disease)   . H/O metabolic acidosis   . Hyperlipidemia   . Hyperparathyroidism, secondary (HCC)   . Hypertension    Dr. Regino Schultze, [place], [place]  . Oxygen dependent   . Vitamin D deficiency    Past Surgical History:  Procedure Laterality Date  . ABDOMINAL HYSTERECTOMY    . AV FISTULA PLACEMENT  04/01/2012   Procedure: ARTERIOVENOUS (AV) FISTULA CREATION;  Surgeon: Sherren Kerns, MD;  [facility] OR;  Service: Vascular;  Laterality: Left;  . CHOLECYSTECTOMY    . COLON SURGERY     for a blockage  . ESOPHAGOGASTRODUODENOSCOPY N/A 02/17/2017   Procedure: ESOPHAGOGASTRODUODENOSCOPY (EGD);  Surgeon: Sherrilyn Rist, MD;  Location: [facility] ENDOSCOPY;  Service: Endoscopy;  Laterality: N/A;  Bedside case in ICU  . EYE SURGERY     bilateral cataracts, /w  IOL - 2013  . FISTULOGRAM N/A 11/05/2014   Procedure: FISTULOGRAM;  Surgeon: Larina Earthly, MD;  Location: [facility] CATH LAB;  Service: Cardiovascular;  Laterality: N/A;  . INSERTION OF DIALYSIS CATHETER  04/10/2012   Procedure: INSERTION OF DIALYSIS CATHETER;  Surgeon: Pryor Ochoa, MD;  Location: [facility] OR;  Service: Vascular;  Laterality: N/A;  Insertion Diatek Catheter Right Internal Jugular  . LIGATION OF COMPETING BRANCHES OF ARTERIOVENOUS FISTULA  11/07/2012   Procedure: LIGATION OF COMPETING BRANCHES OF ARTERIOVENOUS  FISTULA;  Surgeon: Chuck Hint, MD;  Location: [facility] OR;  Service: Vascular;  Laterality: Left;  Brachio/Cephalic Fistula  . SHUNTOGRAM N/A 11/03/2012   Procedure: Betsey Amen;  Surgeon: Chuck Hint, MD;  Location: [facility] CATH LAB;  Service: Cardiovascular;  Laterality: N/A;  . SHUNTOGRAM Left 03/09/2013   Procedure: Fistulogram;  Surgeon: Fransisco Hertz, MD;  Location: [facility] CATH LAB;  Service: Cardiovascular;  Laterality: Left;  . SHUNTOGRAM Left 11/23/2013   Procedure: FISTULOGRAM;  Surgeon: Fransisco Hertz, MD;  Location: [facility] CATH LAB;  Service: Cardiovascular;  Laterality: Left;    No Known Allergies  Outpatient Encounter Prescriptions as of 03/01/2017  Medication Sig  . acetaminophen (TYLENOL) 325 MG tablet Take 650 mg by mouth every 4 (four) hours as needed.  . ALPRAZolam (XANAX) 0.5 MG tablet Take 0.5 mg by mouth 3 (three) times daily as needed for anxiety.  . Biotin 1 MG CAPS Take 1 mg by mouth daily.  Marland Kitchen bismuth subsalicylate (PEPTO BISMOL) 262 MG chewable tablet Chew 2 tablets (524 mg total) by mouth 4 (four) times daily.  Marland Kitchen diltiazem (CARDIZEM CD) 120 MG 24 hr capsule Take 2 tablets in the am ( 240 mg)  And take 1 tablet (120 mg) at night  . doxycycline (VIBRA-TABS) 100 MG tablet Take 1 tablet (100 mg total) by mouth 2 (two) times daily.  Marland Kitchen HYDROcodone-acetaminophen (NORCO/VICODIN) 5-325 MG tablet Take 1 tablet by mouth every 8 (eight) hours as needed for moderate pain.  Marland Kitchen ipratropium-albuterol (DUONEB) 0.5-2.5 (3) MG/3ML SOLN Take 3 mLs by nebulization every 6 (six) hours as needed.  . metoprolol tartrate (LOPRESSOR) 25 MG tablet Take 1 tablet (25 mg total) by mouth 2 (two) times daily.  . metroNIDAZOLE (FLAGYL) 250 MG tablet Take 1 tablet (250 mg total) by mouth 4 (four) times daily.  . multivitamin (RENA-VIT) TABS tablet Take 1 tablet by mouth daily.  . pantoprazole (PROTONIX) 40 MG tablet Take 1 tablet (40 mg total) by mouth 2 (two) times daily.  . pentoxifylline (TRENTAL) 400 MG  CR tablet Take 400 mg by mouth 3 (three) times daily with meals.  . promethazine (PHENERGAN) 25 MG tablet Take 25 mg by mouth every 6 (six) hours as needed for nausea or vomiting.  . sevelamer carbonate (RENVELA) 800 MG tablet Take 1,600 mg by mouth 3 (three) times daily with meals. Take 800mg  with snacks daily.   . simvastatin (ZOCOR) 10 MG tablet Take 10 mg by mouth daily.   No facility-administered encounter medications on file as of 03/01/2017.     Review of Systems   Gen. she is not complaining of any fever or chills.  Skin does not complain of increased rashes itching or bruising.  Head ears nose mouth throat does not claim sore throat or visual changes.  Respiratory is not complaining of shortness of breath or increased cough.  Cardiac does not complaining of chest pain or palpitations as what appears to be chronic lower extremity edema.  GI is  not complaining of abdominal pain nausea vomiting diarrhea constipation does say she still has some black tarry stools.  GU does not complain of dysuria she has end-stage renal disease on dialysis.  Musculoskeletal does still have some weakness is not complaining of joint pain currently.  Neurologic does not complain of dizziness headache or syncope.  Psych does not complain of overt anxiety or depression  Immunization History  Administered Date(s) Administered  . Influenza-Unspecified 08/12/2016  . Pneumococcal Polysaccharide-23 04/10/2016   Pertinent  Health Maintenance Due  Topic Date Due  . DEXA SCAN  11/22/1995  . PNA vac Low Risk Adult (2 of 2 - PCV13) 04/10/2017  . INFLUENZA VACCINE  05/29/2017   No flowsheet data found. Functional Status Survey:    Vitals:   03/01/17 1102  BP: 123/61  Pulse: 78  Resp: 20  Temp: 98.2 F (36.8 C)  TempSrc: Oral    Physical Exam Constitutional: She is oriented to person, place, and time. She appears well-developed and well-nourished.  HENT:  Head: Normocephalic.    Mouth/Throat: Oropharynx is clear and moist.  Eyes: Pupils are equal, round, and reactive to light.  Neck: Neck supple.  Cardiovascular: Normal rate and normal heart sounds.  An irregularly irregular rhythm present.  No murmur heard. Pulmonary/Chest: Effort normal and breath sounds normal. No respiratory distress. She has no wheezes. She has no rales.  Abdominal: Soft. Bowel sounds are normal. She exhibits no distension. There is no tenderness. There is no rebound.  Musculoskeletal:   has lower leg edema trace- one plus  Lymphadenopathy:    She has no cervical adenopathy.  Neurological: She is alert and oriented to person, place, and time.  No focal deficit  Skin: Skin is warm and dry. Bruit is appreciated shunt upper left arm  Psychiatric: She has a normal mood and affect. Her behavior is normal. Thought content normal.    Labs reviewed:  Recent Labs  02/17/17 1333  02/18/17 0459 02/19/17 0511 02/20/17 0450 02/22/17 0454 02/25/17 0900 02/28/17 0700 03/01/17 0700  NA  --   < > 137 137 135 139 138 138 138  K  --   < > 2.9* 4.6 4.0 3.6 3.4* 2.9* 3.0*  CL  --   < > 100* 101 97* 99* 99* 98* 98*  CO2  --   < > 25 23 28 29 28 25 28   GLUCOSE  --   < > 107* 148* 147* 88 99 107* 122*  BUN  --   < > 47* 76* 40* 33* 32* 22* 15  CREATININE  --   < > 3.06* 5.08* 3.31* 3.64* 6.13* 6.59* 5.47*  CALCIUM  --   < > 8.8* 8.8* 8.8* 8.6* 9.3 9.5 9.8  MG 1.5*  --  1.7  --   --   --   --   --   --   PHOS 5.1*  --  2.2* 6.1* 6.5* 5.3*  --   --   --   < > = values in this interval not displayed.  Recent Labs  11/08/16 1904 11/09/16 0420 02/16/17 0741 02/19/17 0511 02/20/17 0450 02/22/17 0454  AST 23 18 13*  --   --   --   ALT 19 17 13*  --   --   --   ALKPHOS 66 53 37*  --   --   --   BILITOT 1.0 1.0 1.6*  --   --   --   PROT 7.8 6.4* 5.2*  --   --   --  ALBUMIN 3.2* 2.7* 2.4* 2.2* 2.2* 2.4*    Recent Labs  11/08/16 1904  02/16/17 0741  02/21/17 1547 02/25/17 0900  02/28/17 0700  WBC 19.0*  < > 11.3*  < > 13.3* 16.4* 11.5*  NEUTROABS 14.4*  --  9.3*  --   --  13.6*  --   HGB 11.9*  < > 7.3*  < > 8.4* 9.3* 8.8*  HCT 37.7  < > 23.2*  < > 26.4* 29.3* 27.8*  MCV 94.7  < > 94.7  < > 89.5 93.0 93.0  PLT 530*  < > 306  < > 164 345 404*  < > = values in this interval not displayed. Lab Results  Component Value Date   TSH 0.836 11/09/2016   No results found for: HGBA1C Lab Results  Component Value Date   TRIG 270 (H) 02/17/2017    Significant Diagnostic Results in last 30 days:  Ct Head Wo Contrast  Result Date: 02/16/2017 CLINICAL DATA:  Headache since a fall 2 weeks ago. The patient is on Coumadin. EXAM: CT HEAD WITHOUT CONTRAST TECHNIQUE: Contiguous axial images were obtained from the base of the skull through the vertex without intravenous contrast. COMPARISON:  None. FINDINGS: Brain: There is some cortical atrophy and chronic microvascular ischemic change. No evidence of acute abnormality including hemorrhage, infarct, mass lesion, mass effect, midline shift or abnormal extra-axial fluid collections identified. No hydrocephalus or pneumocephalus. Vascular: Atherosclerosis noted. Skull: Intact. Sinuses/Orbits: No acute abnormality. Other: None. IMPRESSION: No acute abnormality. Mild atrophy and chronic microvascular ischemic change. Electronically Signed   By: Drusilla Kanner M.D.   On: 02/16/2017 09:07   Portable Chest Xray  Result Date: 02/18/2017 CLINICAL DATA:  Respiratory failure, shortness of breath. EXAM: PORTABLE CHEST 1 VIEW COMPARISON:  Radiograph of February 17, 2017. FINDINGS: Stable cardiomegaly. Atherosclerosis of thoracic aorta is noted. Endotracheal tube is in grossly good position. No pneumothorax is noted. Right internal jugular catheter is unchanged. Mild bibasilar edema or atelectasis is noted with possible associated pleural effusions. Bony thorax is unremarkable. IMPRESSION: Aortic atherosclerosis. Stable support apparatus. Mild  bibasilar atelectasis or edema with possible associated pleural effusions. Electronically Signed   By: Lupita Raider, M.D.   On: 02/18/2017 07:34   Dg Chest Port 1 View  Result Date: 02/17/2017 CLINICAL DATA:  Respiratory failure.  Status post intubation today. EXAM: PORTABLE CHEST 1 VIEW COMPARISON:  Single-view of the chest earlier today. FINDINGS: New endotracheal tube is in place with the tip projecting in good position at the level of the clavicular heads. Right IJ catheter is again seen. NG tube has been removed. Left basilar airspace disease is unchanged. There is cardiomegaly. No pneumothorax. IMPRESSION: Endotracheal tube projects in good position. No change in left basilar airspace disease. Cardiomegaly. Atherosclerosis. Electronically Signed   By: Drusilla Kanner M.D.   On: 02/17/2017 11:31   Dg Chest Port 1 View  Result Date: 02/17/2017 CLINICAL DATA:  Central line placement EXAM: PORTABLE CHEST 1 VIEW COMPARISON:  02/16/2017 FINDINGS: The patient is rotated. Moderate-to-marked cardiomegaly as before. Hazy atelectasis or infiltrate at the left base. Suspect tiny pleural effusions. Atherosclerosis of the aorta. Esophageal tube tip is in the left upper quadrant. Right-sided central venous catheter tip overlies the cavoatrial junction. There is no pneumothorax. IMPRESSION: 1. Right-sided central venous catheter tip projects over the cavoatrial junction. No pneumothorax 2. Stable degree of cardiomegaly. Suspect tiny pleural effusions and hazy atelectasis or infiltrate at the left base. Electronically Signed   By: Selena Batten  Jake Samples M.D.   On: 02/17/2017 01:00   Dg Chest Port 1 View  Result Date: 02/16/2017 CLINICAL DATA:  83 year old female undergoing nasogastric tube placement. EXAM: PORTABLE CHEST 1 VIEW COMPARISON:  Prior chest x-ray 11/08/2016 FINDINGS: Stable cardiomegaly. Mediastinal contours are unchanged. Atherosclerotic calcifications again noted in the transverse aorta. A gastric tube is  present. The tip of the tube overlies the distal esophagus. No focal airspace consolidation, pleural effusion or pneumothorax. No suspicious nodule or mass. Stable mild vascular congestion and chronic bronchitic changes. No acute osseous abnormality. IMPRESSION: 1. Stable chest x-ray without evidence of acute cardiopulmonary process. 2. The tip of a gastric tube projects over the distal esophagus. Recommend advancing 02-2009 cm for placement in the stomach. 3. Stable cardiomegaly. 4.  Aortic Atherosclerosis (ICD10-170.0) Electronically Signed   By: Malachy Moan M.D.   On: 02/16/2017 11:56   Dg Abd Portable 1v  Result Date: 02/16/2017 CLINICAL DATA:  Patient status post enteric tube placement. EXAM: PORTABLE ABDOMEN - 1 VIEW COMPARISON:  Chest radiograph 02/16/2017 FINDINGS: Enteric tube tip and side-port project over the left upper quadrant. Nonspecific nondilated loops of bowel within the central abdomen and right lower quadrant. Cholecystectomy clips. Thoracic spine lower lumbar spine degenerative changes. IMPRESSION: Enteric tube tip and side-port project over the stomach. Electronically Signed   By: Annia Belt M.D.   On: 02/16/2017 16:01    Assessment/Plan  #1-hypokalemia-this was discussed with Dr. Chales Abrahams via phone-also nursing his left a message with dialysis about any recommendations-per discussion with Dr. Chales Abrahams will give 40 mEq of potassium tonight-and have dialysis notified she will have dialysis tomorrow for follow-up.  Clinically she appears stable.  #2 history of GI bleed she continues to have some tarry stools-will update a CBC-she is on Protonix.  Also will guaic stools 3 and obtain a GI consult  [phone]

## 2017-03-02 DIAGNOSIS — Z992 Dependence on renal dialysis: Secondary | ICD-10-CM | POA: Diagnosis not present

## 2017-03-02 DIAGNOSIS — N186 End stage renal disease: Secondary | ICD-10-CM | POA: Diagnosis not present

## 2017-03-04 ENCOUNTER — Encounter (HOSPITAL_COMMUNITY)
Admission: RE | Admit: 2017-03-04 | Discharge: 2017-03-04 | Disposition: A | Discharge status: Home / Self Care | Payer: Medicare Other | Source: Skilled Nursing Facility | Attending: Internal Medicine | Admitting: Internal Medicine

## 2017-03-04 DIAGNOSIS — I10 Essential (primary) hypertension: Secondary | ICD-10-CM | POA: Insufficient documentation

## 2017-03-04 DIAGNOSIS — N2581 Secondary hyperparathyroidism of renal origin: Secondary | ICD-10-CM | POA: Insufficient documentation

## 2017-03-04 DIAGNOSIS — K920 Hematemesis: Secondary | ICD-10-CM | POA: Insufficient documentation

## 2017-03-04 DIAGNOSIS — K254 Chronic or unspecified gastric ulcer with hemorrhage: Secondary | ICD-10-CM | POA: Insufficient documentation

## 2017-03-04 DIAGNOSIS — N186 End stage renal disease: Secondary | ICD-10-CM | POA: Insufficient documentation

## 2017-03-04 DIAGNOSIS — R262 Difficulty in walking, not elsewhere classified: Secondary | ICD-10-CM | POA: Insufficient documentation

## 2017-03-04 DIAGNOSIS — M6281 Muscle weakness (generalized): Secondary | ICD-10-CM | POA: Insufficient documentation

## 2017-03-04 LAB — CBC WITH DIFFERENTIAL/PLATELET
BASOS PCT: 0 %
Basophils Absolute: 0.1 10*3/uL (ref 0.0–0.1)
Eosinophils Absolute: 0.2 10*3/uL (ref 0.0–0.7)
Eosinophils Relative: 1 %
HEMATOCRIT: 25.5 % — AB (ref 36.0–46.0)
Hemoglobin: 8.2 g/dL — ABNORMAL LOW (ref 12.0–15.0)
Lymphocytes Relative: 9 %
Lymphs Abs: 1 10*3/uL (ref 0.7–4.0)
MCH: 29.8 pg (ref 26.0–34.0)
MCHC: 32.2 g/dL (ref 30.0–36.0)
MCV: 92.7 fL (ref 78.0–100.0)
MONO ABS: 1.1 10*3/uL — AB (ref 0.1–1.0)
MONOS PCT: 9 %
NEUTROS ABS: 9.2 10*3/uL — AB (ref 1.7–7.7)
Neutrophils Relative %: 81 %
Platelets: 231 10*3/uL (ref 150–400)
RBC: 2.75 MIL/uL — ABNORMAL LOW (ref 3.87–5.11)
RDW: 18.1 % — AB (ref 11.5–15.5)
WBC: 11.5 10*3/uL — ABNORMAL HIGH (ref 4.0–10.5)

## 2017-03-04 LAB — BASIC METABOLIC PANEL
ANION GAP: 9 (ref 5–15)
BUN: 21 mg/dL — ABNORMAL HIGH (ref 6–20)
CALCIUM: 9.6 mg/dL (ref 8.9–10.3)
CHLORIDE: 99 mmol/L — AB (ref 101–111)
CO2: 29 mmol/L (ref 22–32)
Creatinine, Ser: 7.23 mg/dL — ABNORMAL HIGH (ref 0.44–1.00)
GFR calc Af Amer: 5 mL/min — ABNORMAL LOW (ref >60)
GFR calc non Af Amer: 5 mL/min — ABNORMAL LOW (ref >60)
GLUCOSE: 110 mg/dL — AB (ref 65–99)
Potassium: 3.7 mmol/L (ref 3.5–5.1)
SODIUM: 137 mmol/L (ref 135–145)

## 2017-03-05 ENCOUNTER — Non-Acute Institutional Stay (SKILLED_NURSING_FACILITY): Payer: Medicare Other | Admitting: Internal Medicine

## 2017-03-05 ENCOUNTER — Encounter: Payer: Self-pay | Admitting: Internal Medicine

## 2017-03-05 DIAGNOSIS — R451 Restlessness and agitation: Secondary | ICD-10-CM | POA: Diagnosis not present

## 2017-03-05 DIAGNOSIS — K254 Chronic or unspecified gastric ulcer with hemorrhage: Secondary | ICD-10-CM | POA: Diagnosis not present

## 2017-03-05 DIAGNOSIS — D62 Acute posthemorrhagic anemia: Secondary | ICD-10-CM

## 2017-03-05 DIAGNOSIS — N186 End stage renal disease: Secondary | ICD-10-CM

## 2017-03-05 DIAGNOSIS — Z992 Dependence on renal dialysis: Secondary | ICD-10-CM | POA: Diagnosis not present

## 2017-03-05 NOTE — Progress Notes (Signed)
Location:   [facility] Room Number: 144/P Place of Service:  SNF (31) Provider:  Carmine Savoy, MD  Patient Care Team: [name], MD as PCP - General (Internal Medicine) [name], MD as Attending Physician (Nephrology)  Extended Emergency Contact Information Primary Emergency Contact: Bogan,Willie Address: [address]          [place], [place] [address] [place] Home Phone: [phone] Relation: Spouse Secondary Emergency Contact: Gweneth Dimitri, [place] [address] [place] Home Phone: [phone] Relation: Daughter  Code Status:  Full Code Goals of care: Advanced Directive information Advanced Directives 03/05/2017  Does Patient Have a Medical Advance Directive? Yes  Type of Advance Directive (No Data)  Does patient want to make changes to medical advance directive? No - Patient declined  Would patient like information on creating a medical advance directive? No - Patient declined  Pre-existing out of facility DNR order (yellow form or pink MOST form) -     Chief Complaint  Patient presents with  . Acute Visit    Told Nurses she doesn't want to live and would rather die then stay here    HPI:  Pt is a 83 y.o. female seen today for an acute visit for for above. Patient has h/o ESRD on Dialysis TTS since 2013, Recent new onset Atrial fibrillation 01/18,anemia and GI bleed due to Gastric ulcer requiring Clipping and injection.  She was admitted to SNF for therapy after extensive hospitalization for gastric bleed. Yesterday patient got very upset with the Night Nurses and told them that she did not want to live and rather die then stay here. I saw patient after dialysis and she says she is feeling much better today. She does remember lashing out at the nurses but says it was just momentarily that she got upset. She does feel down due to needing their help and unable to ambulate  without their help. She denies any suicidal thoughts. Her mood is better today. She only wants to know when she can go back home. She says she doesn;t like it here. She does not like the food and some Nurses are not nice and rude to her. She has very supportive family including her sister and husband. Patient is doing well with therapy and walking with the walker. But just stays weak and deconditioned.  She denied anymore Black tarry stool. Patient did say that some of her meds are making her confused .   Past Medical History:  Diagnosis Date  . Anemia 02/11/2012  . Anxiety   . Atrial fibrillation (HCC)   . CHF (congestive heart failure) (HCC)   . Chronic kidney disease    not on dialysis yet, Tues, thurs, sat  . GERD (gastroesophageal reflux disease)   . H/O metabolic acidosis   . Hyperlipidemia   . Hyperparathyroidism, secondary (HCC)   . Hypertension    Dr. Regino Schultze, [place], [place]  . Oxygen dependent   . Vitamin D deficiency    Past Surgical History:  Procedure Laterality Date  . ABDOMINAL HYSTERECTOMY    . AV FISTULA PLACEMENT  04/01/2012   Procedure: ARTERIOVENOUS (AV) FISTULA CREATION;  Surgeon: Sherren Kerns, MD;  Location: [facility] OR;  Service: Vascular;  Laterality: Left;  . CHOLECYSTECTOMY    . COLON SURGERY     for a blockage  . ESOPHAGOGASTRODUODENOSCOPY N/A 02/17/2017   Procedure: ESOPHAGOGASTRODUODENOSCOPY (EGD);  Surgeon: Charlie Pitter III,  MD;  Location: [facility] ENDOSCOPY;  Service: Endoscopy;  Laterality: N/A;  Bedside case in ICU  . EYE SURGERY     bilateral cataracts, /w IOL - 2013  . FISTULOGRAM N/A 11/05/2014   Procedure: FISTULOGRAM;  Surgeon: Larina Earthly, MD;  Location: [facility] CATH LAB;  Service: Cardiovascular;  Laterality: N/A;  . INSERTION OF DIALYSIS CATHETER  04/10/2012   Procedure: INSERTION OF DIALYSIS CATHETER;  Surgeon: Pryor Ochoa, MD;  Location: [facility] OR;  Service: Vascular;  Laterality: N/A;  Insertion Diatek Catheter Right Internal Jugular  . LIGATION OF  COMPETING BRANCHES OF ARTERIOVENOUS FISTULA  11/07/2012   Procedure: LIGATION OF COMPETING BRANCHES OF ARTERIOVENOUS FISTULA;  Surgeon: Chuck Hint, MD;  Location: [facility] OR;  Service: Vascular;  Laterality: Left;  Brachio/Cephalic Fistula  . SHUNTOGRAM N/A 11/03/2012   Procedure: Betsey Amen;  Surgeon: Chuck Hint, MD;  Location: [facility] CATH LAB;  Service: Cardiovascular;  Laterality: N/A;  . SHUNTOGRAM Left 03/09/2013   Procedure: Fistulogram;  Surgeon: Fransisco Hertz, MD;  Location: [facility] CATH LAB;  Service: Cardiovascular;  Laterality: Left;  . SHUNTOGRAM Left 11/23/2013   Procedure: FISTULOGRAM;  Surgeon: Fransisco Hertz, MD;  Location: [facility] CATH LAB;  Service: Cardiovascular;  Laterality: Left;    No Known Allergies  Outpatient Encounter Prescriptions as of 03/05/2017  Medication Sig  . acetaminophen (TYLENOL) 325 MG tablet Take 650 mg by mouth every 4 (four) hours as needed.  . ALPRAZolam (XANAX) 0.5 MG tablet Take 0.5 mg by mouth 3 (three) times daily as needed for anxiety.  . Biotin 1 MG CAPS Take 1 mg by mouth daily.  Marland Kitchen bismuth subsalicylate (PEPTO BISMOL) 262 MG chewable tablet Chew 2 tablets (524 mg total) by mouth 4 (four) times daily.  Marland Kitchen diltiazem (CARDIZEM CD) 120 MG 24 hr capsule Take 2 tablets in the am ( 240 mg)  And take 1 tablet (120 mg) at night  . doxycycline (VIBRA-TABS) 100 MG tablet Take 1 tablet (100 mg total) by mouth 2 (two) times daily.  Marland Kitchen HYDROcodone-acetaminophen (NORCO/VICODIN) 5-325 MG tablet Take 1 tablet by mouth every 8 (eight) hours as needed for moderate pain.  Marland Kitchen ipratropium-albuterol (DUONEB) 0.5-2.5 (3) MG/3ML SOLN Take 3 mLs by nebulization every 6 (six) hours as needed.  . metoprolol tartrate (LOPRESSOR) 25 MG tablet Take 1 tablet (25 mg total) by mouth 2 (two) times daily.  . metroNIDAZOLE (FLAGYL) 250 MG tablet Take 1 tablet (250 mg total) by mouth 4 (four) times daily.  . multivitamin (RENA-VIT) TABS tablet Take 1 tablet by mouth daily.  . pantoprazole  (PROTONIX) 40 MG tablet Take 1 tablet (40 mg total) by mouth 2 (two) times daily.  . pentoxifylline (TRENTAL) 400 MG CR tablet Take 400 mg by mouth 3 (three) times daily with meals.  . promethazine (PHENERGAN) 25 MG tablet Take 25 mg by mouth every 6 (six) hours as needed for nausea or vomiting.  . sevelamer carbonate (RENVELA) 800 MG tablet Take 1,600 mg by mouth 3 (three) times daily with meals. Take 800mg  with snacks daily.   . simvastatin (ZOCOR) 10 MG tablet Take 10 mg by mouth daily.   No facility-administered encounter medications on file as of 03/05/2017.     Review of Systems  Review of Systems  Constitutional: Negative for activity change, , chills, diaphoresis, fatigue and fever.Low appetite  HENT: Negative for mouth sores, postnasal drip, rhinorrhea, sinus pain and sore throat.   Respiratory: Negative for apnea, cough, chest tightness, shortness of breath and  wheezing.   Cardiovascular: Negative for chest pain, palpitations and leg swelling.  Gastrointestinal: Negative for abdominal distention, abdominal pain, constipation, diarrhea, nausea and vomiting.  Genitourinary: Negative for dysuria and frequency.  Musculoskeletal: Negative for arthralgias, joint swelling positive for Mylagia  Skin: Negative for rash.  Neurological: Negative for dizziness, syncope,  light-headedness and numbness.  Review of Systems  Psychiatric/Behavioral: Positive for confusion and sleep disturbance. Negative for dysphoric mood and suicidal ideas. The patient is not nervous/anxious.    Immunization History  Administered Date(s) Administered  . Influenza-Unspecified 08/12/2016  . Pneumococcal Polysaccharide-23 04/10/2016   Pertinent  Health Maintenance Due  Topic Date Due  . DEXA SCAN  11/22/1995  . PNA vac Low Risk Adult (2 of 2 - PCV13) 04/10/2017  . INFLUENZA VACCINE  05/29/2017   No flowsheet data found. Functional Status Survey:    There were no vitals filed for this visit. There is no  height or weight on file to calculate BMI. Physical Exam  Constitutional: She appears well-developed and well-nourished.  HENT:  Head: Normocephalic.  Mouth/Throat: Oropharynx is clear and moist.  Eyes: Pupils are equal, round, and reactive to light.  Neck: Neck supple.  Cardiovascular: Normal rate and normal heart sounds.  An irregular rhythm present.  Pulmonary/Chest: Effort normal and breath sounds normal. No respiratory distress. She has no wheezes. She has no rales.  Abdominal: Soft. Bowel sounds are normal. She exhibits no distension. There is no tenderness. There is no rebound.  Musculoskeletal:  Mild edema and has ted hoses on  Neurological: She is alert.  Skin: Skin is warm and dry.  Psychiatric: Her speech is normal and behavior is normal. Judgment and thought content normal. Her mood appears not anxious. Her affect is not angry. Cognition and memory are normal. She does not exhibit a depressed mood.    Labs reviewed:  Recent Labs  02/17/17 1333  02/18/17 0459 02/19/17 0511 02/20/17 0450 02/22/17 0454  02/28/17 0700 03/01/17 0700 03/04/17 1140  NA  --   < > 137 137 135 139  < > 138 138 137  K  --   < > 2.9* 4.6 4.0 3.6  < > 2.9* 3.0* 3.7  CL  --   < > 100* 101 97* 99*  < > 98* 98* 99*  CO2  --   < > 25 23 28 29   < > 25 28 29   GLUCOSE  --   < > 107* 148* 147* 88  < > 107* 122* 110*  BUN  --   < > 47* 76* 40* 33*  < > 22* 15 21*  CREATININE  --   < > 3.06* 5.08* 3.31* 3.64*  < > 6.59* 5.47* 7.23*  CALCIUM  --   < > 8.8* 8.8* 8.8* 8.6*  < > 9.5 9.8 9.6  MG 1.5*  --  1.7  --   --   --   --   --   --   --   PHOS 5.1*  --  2.2* 6.1* 6.5* 5.3*  --   --   --   --   < > = values in this interval not displayed.  Recent Labs  11/08/16 1904 11/09/16 0420 02/16/17 0741 02/19/17 0511 02/20/17 0450 02/22/17 0454  AST 23 18 13*  --   --   --   ALT 19 17 13*  --   --   --   ALKPHOS 66 53 37*  --   --   --  BILITOT 1.0 1.0 1.6*  --   --   --   PROT 7.8 6.4* 5.2*  --    --   --   ALBUMIN 3.2* 2.7* 2.4* 2.2* 2.2* 2.4*    Recent Labs  02/16/17 0741  02/25/17 0900 02/28/17 0700 03/04/17 1140  WBC 11.3*  < > 16.4* 11.5* 11.5*  NEUTROABS 9.3*  --  13.6*  --  9.2*  HGB 7.3*  < > 9.3* 8.8* 8.2*  HCT 23.2*  < > 29.3* 27.8* 25.5*  MCV 94.7  < > 93.0 93.0 92.7  PLT 306  < > 345 404* 231  < > = values in this interval not displayed. Lab Results  Component Value Date   TSH 0.836 11/09/2016   No results found for: HGBA1C Lab Results  Component Value Date   TRIG 270 (H) 02/17/2017    Significant Diagnostic Results in last 30 days:  Ct Head Wo Contrast  Result Date: 02/16/2017 CLINICAL DATA:  Headache since a fall 2 weeks ago. The patient is on Coumadin. EXAM: CT HEAD WITHOUT CONTRAST TECHNIQUE: Contiguous axial images were obtained from the base of the skull through the vertex without intravenous contrast. COMPARISON:  None. FINDINGS: Brain: There is some cortical atrophy and chronic microvascular ischemic change. No evidence of acute abnormality including hemorrhage, infarct, mass lesion, mass effect, midline shift or abnormal extra-axial fluid collections identified. No hydrocephalus or pneumocephalus. Vascular: Atherosclerosis noted. Skull: Intact. Sinuses/Orbits: No acute abnormality. Other: None. IMPRESSION: No acute abnormality. Mild atrophy and chronic microvascular ischemic change. Electronically Signed   By: Drusilla Kanner M.D.   On: 02/16/2017 09:07   Portable Chest Xray  Result Date: 02/18/2017 CLINICAL DATA:  Respiratory failure, shortness of breath. EXAM: PORTABLE CHEST 1 VIEW COMPARISON:  Radiograph of February 17, 2017. FINDINGS: Stable cardiomegaly. Atherosclerosis of thoracic aorta is noted. Endotracheal tube is in grossly good position. No pneumothorax is noted. Right internal jugular catheter is unchanged. Mild bibasilar edema or atelectasis is noted with possible associated pleural effusions. Bony thorax is unremarkable. IMPRESSION: Aortic  atherosclerosis. Stable support apparatus. Mild bibasilar atelectasis or edema with possible associated pleural effusions. Electronically Signed   By: Lupita Raider, M.D.   On: 02/18/2017 07:34   Dg Chest Port 1 View  Result Date: 02/17/2017 CLINICAL DATA:  Respiratory failure.  Status post intubation today. EXAM: PORTABLE CHEST 1 VIEW COMPARISON:  Single-view of the chest earlier today. FINDINGS: New endotracheal tube is in place with the tip projecting in good position at the level of the clavicular heads. Right IJ catheter is again seen. NG tube has been removed. Left basilar airspace disease is unchanged. There is cardiomegaly. No pneumothorax. IMPRESSION: Endotracheal tube projects in good position. No change in left basilar airspace disease. Cardiomegaly. Atherosclerosis. Electronically Signed   By: Drusilla Kanner M.D.   On: 02/17/2017 11:31   Dg Chest Port 1 View  Result Date: 02/17/2017 CLINICAL DATA:  Central line placement EXAM: PORTABLE CHEST 1 VIEW COMPARISON:  02/16/2017 FINDINGS: The patient is rotated. Moderate-to-marked cardiomegaly as before. Hazy atelectasis or infiltrate at the left base. Suspect tiny pleural effusions. Atherosclerosis of the aorta. Esophageal tube tip is in the left upper quadrant. Right-sided central venous catheter tip overlies the cavoatrial junction. There is no pneumothorax. IMPRESSION: 1. Right-sided central venous catheter tip projects over the cavoatrial junction. No pneumothorax 2. Stable degree of cardiomegaly. Suspect tiny pleural effusions and hazy atelectasis or infiltrate at the left base. Electronically Signed   By: Selena Batten  Jake Samples M.D.   On: 02/17/2017 01:00   Dg Chest Port 1 View  Result Date: 02/16/2017 CLINICAL DATA:  83 year old female undergoing nasogastric tube placement. EXAM: PORTABLE CHEST 1 VIEW COMPARISON:  Prior chest x-ray 11/08/2016 FINDINGS: Stable cardiomegaly. Mediastinal contours are unchanged. Atherosclerotic calcifications again  noted in the transverse aorta. A gastric tube is present. The tip of the tube overlies the distal esophagus. No focal airspace consolidation, pleural effusion or pneumothorax. No suspicious nodule or mass. Stable mild vascular congestion and chronic bronchitic changes. No acute osseous abnormality. IMPRESSION: 1. Stable chest x-ray without evidence of acute cardiopulmonary process. 2. The tip of a gastric tube projects over the distal esophagus. Recommend advancing 02-2009 cm for placement in the stomach. 3. Stable cardiomegaly. 4.  Aortic Atherosclerosis (ICD10-170.0) Electronically Signed   By: Malachy Moan M.D.   On: 02/16/2017 11:56   Dg Abd Portable 1v  Result Date: 02/16/2017 CLINICAL DATA:  Patient status post enteric tube placement. EXAM: PORTABLE ABDOMEN - 1 VIEW COMPARISON:  Chest radiograph 02/16/2017 FINDINGS: Enteric tube tip and side-port project over the left upper quadrant. Nonspecific nondilated loops of bowel within the central abdomen and right lower quadrant. Cholecystectomy clips. Thoracic spine lower lumbar spine degenerative changes. IMPRESSION: Enteric tube tip and side-port project over the stomach. Electronically Signed   By: Annia Belt M.D.   On: 02/16/2017 16:01    Assessment/Plan  Depression and agitation  Patient seems to be back to her normal self. I don't think she is suicidal or Depressed to need any treatment. It seems more like disagreement with the Nursing staff. Will though Discontinue her Xanax and decrease her hydrocodone as they both can cause her to have confusion. Will follow . D/W patient and her family in her room.  GI Bleed with Anemia Patient Hgb is stable and she is not having any more Black tarry stool. Continue Protonix Also on H Pylori treatment Will start her on iron Also repeat Hgb in 1 week Follow with GI Atrial fibrillation with RVR  Patient rate is controlled here on Cardizem And metoprolol. ` She is off Coumadin right now. She did  see Cardiology No New recommendations right now.  ESRD (end stage renal disease)  Continue Dialysis on TTS   Family/ staff Communication:   Labs/tests ordered:  CBC Total time spent in this patient care encounter was 25_ minutes; greater than 50% of the visit spent counseling patient and coordinating care for problems addressed at this encounter.

## 2017-03-07 DIAGNOSIS — N186 End stage renal disease: Secondary | ICD-10-CM | POA: Diagnosis not present

## 2017-03-07 DIAGNOSIS — Z992 Dependence on renal dialysis: Secondary | ICD-10-CM | POA: Diagnosis not present

## 2017-03-08 ENCOUNTER — Encounter: Payer: Self-pay | Admitting: Internal Medicine

## 2017-03-08 ENCOUNTER — Non-Acute Institutional Stay (SKILLED_NURSING_FACILITY): Payer: Medicare Other | Admitting: Internal Medicine

## 2017-03-08 DIAGNOSIS — M542 Cervicalgia: Secondary | ICD-10-CM

## 2017-03-08 DIAGNOSIS — I4891 Unspecified atrial fibrillation: Secondary | ICD-10-CM

## 2017-03-08 DIAGNOSIS — D62 Acute posthemorrhagic anemia: Secondary | ICD-10-CM | POA: Diagnosis not present

## 2017-03-08 NOTE — Progress Notes (Signed)
Location:   [facility] Room Number: 144/P Place of Service:  SNF [phone]) Provider:  Jacelyn Pi, MD  Patient Care Team: [name], MD as PCP - General (Internal Medicine) [name], MD as Attending Physician (Nephrology)  Extended Emergency Contact Information Primary Emergency Contact: Debarr,Willie Address: [address]          [place], [place] [address] [place] Home Phone: [phone] Relation: Spouse Secondary Emergency Contact: Gweneth Dimitri, [place] [address] [place] Home Phone: [phone] Relation: Daughter  Code Status:  Full Code Goals of care: Advanced Directive information Advanced Directives 03/08/2017  Does Patient Have a Medical Advance Directive? Yes  Type of Advance Directive (No Data)  Does patient want to make changes to medical advance directive? No - Patient declined  Would patient like information on creating a medical advance directive? No - Patient declined  Pre-existing out of facility DNR order (yellow form or pink MOST form) -     Chief Complaint  Patient presents with  . Acute Visit    Neck Pain    HPI:  Pt is a 83 y.o. female seen today for an acute visit for Follow-up of neck pain.  Patient did have a fall several weeks ago and says she's had some neck discomfort since then and she says this morning her neck felt quite stiff and was hurting when she tried to rotate it.  She states she's had this in some form for a while now but somewhat worse today. He does not really report any increased weakness or numbness in her arms.  She does complain of arthritis in her hands bilaterally.  Patient has h/o ESRD on Dialysis TTS since 2013, Recent new onset Atrial fibrillation 01/18,anemia and GI bleed due to Gastric ulcer requiring Clipping and injection.  She was admitted to SNF for therapy after extensive hospitalization for gastric bleed. He has  complained of some black tarry stool in the past apparently this has improved somewhat although she says it still occurs at times.  She was seen by Dr. Chales Abrahams 3 days ago and started on iron she continues on Protonix as well there are orders to obtain a 6 updated CBC next Monday-hemoglobin most recent lab was 8.2 previously had been 8.8 9.3 was down to 7.4 at one point.         Past Medical History:  Diagnosis Date  . Anemia 02/11/2012  . Anxiety   . Atrial fibrillation (HCC)   . CHF (congestive heart failure) (HCC)   . Chronic kidney disease    not on dialysis yet, Tues, thurs, sat  . GERD (gastroesophageal reflux disease)   . H/O metabolic acidosis   . Hyperlipidemia   . Hyperparathyroidism, secondary (HCC)   . Hypertension    Dr. Regino Schultze, [place], [place]  . Oxygen dependent   . Vitamin D deficiency    Past Surgical History:  Procedure Laterality Date  . ABDOMINAL HYSTERECTOMY    . AV FISTULA PLACEMENT  04/01/2012   Procedure: ARTERIOVENOUS (AV) FISTULA CREATION;  Surgeon: Sherren Kerns, MD;  Location[facility] OR;  Service: Vascular;  Laterality: Left;  . CHOLECYSTECTOMY    . COLON SURGERY     for a blockage  . ESOPHAGOGASTRODUODENOSCOPY N/A 02/17/2017   Procedure: ESOPHAGOGASTRODUODENOSCOPY (EGD);  Surgeon: Sherrilyn Rist, MD;  Location: [facility] ENDOSCOPY;  Service: Endoscopy;  Laterality: N/A;  Bedside case in ICU  .  EYE SURGERY     bilateral cataracts, /w IOL - 2013  . FISTULOGRAM N/A 11/05/2014   Procedure: FISTULOGRAM;  Surgeon: Larina Earthly, MD;  Location: [facility] CATH LAB;  Service: Cardiovascular;  Laterality: N/A;  . INSERTION OF DIALYSIS CATHETER  04/10/2012   Procedure: INSERTION OF DIALYSIS CATHETER;  Surgeon: Pryor Ochoa, MD;  Location: [facility] OR;  Service: Vascular;  Laterality: N/A;  Insertion Diatek Catheter Right Internal Jugular  . LIGATION OF COMPETING BRANCHES OF ARTERIOVENOUS FISTULA  11/07/2012   Procedure: LIGATION OF COMPETING BRANCHES OF ARTERIOVENOUS FISTULA;   Surgeon: Chuck Hint, MD;  Location: [facility] OR;  Service: Vascular;  Laterality: Left;  Brachio/Cephalic Fistula  . SHUNTOGRAM N/A 11/03/2012   Procedure: Betsey Amen;  Surgeon: Chuck Hint, MD;  Location: [facility] CATH LAB;  Service: Cardiovascular;  Laterality: N/A;  . SHUNTOGRAM Left 03/09/2013   Procedure: Fistulogram;  Surgeon: Fransisco Hertz, MD;  Location[facility] CATH LAB;  Service: Cardiovascular;  Laterality: Left;  . SHUNTOGRAM Left 11/23/2013   Procedure: FISTULOGRAM;  Surgeon: Fransisco Hertz, MD;  Location: [facility] CATH LAB;  Service: Cardiovascular;  Laterality: Left;    No Known Allergies  Outpatient Encounter Prescriptions as of 03/08/2017  Medication Sig  . acetaminophen (TYLENOL) 325 MG tablet Take 650 mg by mouth every 4 (four) hours as needed.  . Biotin 1 MG CAPS Take 1 mg by mouth daily.  Marland Kitchen diltiazem (CARDIZEM CD) 120 MG 24 hr capsule Take 2 tablets in the am ( 240 mg)  And take 1 tablet (120 mg) at night  . ferrous sulfate 325 (65 FE) MG tablet Take 325 mg by mouth daily with breakfast.  . HYDROcodone-acetaminophen (NORCO/VICODIN) 5-325 MG tablet Take 1 tablet by mouth every 12 (twelve) hours as needed for moderate pain.   Marland Kitchen ipratropium-albuterol (DUONEB) 0.5-2.5 (3) MG/3ML SOLN Take 3 mLs by nebulization every 6 (six) hours as needed.  . metoprolol tartrate (LOPRESSOR) 25 MG tablet Take 1 tablet (25 mg total) by mouth 2 (two) times daily.  . multivitamin (RENA-VIT) TABS tablet Take 1 tablet by mouth daily.  . pantoprazole (PROTONIX) 40 MG tablet Take 1 tablet (40 mg total) by mouth 2 (two) times daily.  . pentoxifylline (TRENTAL) 400 MG CR tablet Take 400 mg by mouth 3 (three) times daily with meals.  . promethazine (PHENERGAN) 25 MG tablet Take 25 mg by mouth every 6 (six) hours as needed for nausea or vomiting.  . sevelamer carbonate (RENVELA) 800 MG tablet Take 1,600 mg by mouth 3 (three) times daily with meals. Take 800mg  with snacks daily.   . simvastatin (ZOCOR) 10 MG  tablet Take 10 mg by mouth daily.  . [DISCONTINUED] ALPRAZolam (XANAX) 0.5 MG tablet Take 0.5 mg by mouth 3 (three) times daily as needed for anxiety.  . [DISCONTINUED] bismuth subsalicylate (PEPTO BISMOL) 262 MG chewable tablet Chew 2 tablets (524 mg total) by mouth 4 (four) times daily.  . [DISCONTINUED] doxycycline (VIBRA-TABS) 100 MG tablet Take 1 tablet (100 mg total) by mouth 2 (two) times daily.  . [DISCONTINUED] metroNIDAZOLE (FLAGYL) 250 MG tablet Take 1 tablet (250 mg total) by mouth 4 (four) times daily.   No facility-administered encounter medications on file as of 03/08/2017.     Review of Systems Gen. she is not complaining of any fever or chills.  Skin does not complain of increased rashes itching or bruising.  Head ears nose mouth throat does not claim sore throat or visual changes.  Respiratory is not complaining  of shortness of breath or increased cough.  Cardiac does not complaining of chest pain or palpitations as what appears to be chronic lower extremity edema.  GI is not complaining of abdominal pain nausea vomiting diarrhea constipation says her appetite is not very good but attributes that to the food  GU does not complain of dysuria she has end-stage renal disease on dialysis.  Musculoskeletal does still have some weakness and complains of generalized arthritic pain more so in her hands -but main complaint today is neck pain she says when she tries to bend or rotate her neck she says this has been somewhat chronic since her fall but appears to be getting a bit worse.  Neurologic does not complain of dizziness headache or syncope. Or numbness  Psych does not complain of overt anxiety or depression Immunization History  Administered Date(s) Administered  . Influenza-Unspecified 08/12/2016  . Pneumococcal Polysaccharide-23 04/10/2016   Pertinent  Health Maintenance Due  Topic Date Due  . DEXA SCAN  11/22/1995  . PNA vac Low Risk Adult (2 of 2 -  PCV13) 04/10/2017  . INFLUENZA VACCINE  05/29/2017   No flowsheet data found. Functional Status Survey:    Vitals:   03/08/17 1348  BP: 129/82  Pulse: 90  Resp: 20  Temp: 97.7 F (36.5 C)  TempSrc: Oral    Physical Exam Constitutional: She is oriented to person, place, and time. She appears well-developedand well-nourished. Sitting comfortably in her wheelchair  HENT:  Head: Normocephalic.  Mouth/Throat: Oropharynx is clear and moist.  Eyes: Pupils are equal, round, and reactive to light. Visual acuity appears grossly intact Neck: Neck supple--there is some tenderness to palpation of the back of her neck bilaterally report some tenderness to her shoulder area as well bit more on the left versus the right-she does have somewhat limited range of motion flexing and extending and rotating her neck to either side she says it feels like she has a "crick" in her neck.  Cardiovascular: Normal rateand normal heart sounds. An irregularly irregular rhythmpresent.  No murmurheard. Pulmonary/Chest: Effort normaland breath sounds normal. No respiratory distress. She has no wheezes. She has no rales.  Abdominal: Soft. Bowel sounds are normal. She exhibits no distension. There is no tenderness. There is no rebound.  Musculoskeletal:   has lower leg edema trace- one plus  Lymphadenopathy:  She has no cervical adenopathy.  Neurological: She is alertand oriented to person, place, and time.  No focal deficitstrength appears to be intact bilaterally able to raise her arms bilaterally without difficulty Skin: Skin is warmand dry. Bruit is appreciated shunt upper left arm  Psychiatric: She has a normal mood and affect. Her behavior is normal. Thought contentnormal.  Labs reviewed:  Recent Labs  02/17/17 1333  02/18/17 0459 02/19/17 0511 02/20/17 0450 02/22/17 0454  02/28/17 0700 03/01/17 0700 03/04/17 1140  NA  --   < > 137 137 135 139  < > 138 138 137  K  --   < > 2.9* 4.6 4.0  3.6  < > 2.9* 3.0* 3.7  CL  --   < > 100* 101 97* 99*  < > 98* 98* 99*  CO2  --   < > 25 23 28 29   < > 25 28 29   GLUCOSE  --   < > 107* 148* 147* 88  < > 107* 122* 110*  BUN  --   < > 47* 76* 40* 33*  < > 22* 15 21*  CREATININE  --   < >  3.06* 5.08* 3.31* 3.64*  < > 6.59* 5.47* 7.23*  CALCIUM  --   < > 8.8* 8.8* 8.8* 8.6*  < > 9.5 9.8 9.6  MG 1.5*  --  1.7  --   --   --   --   --   --   --   PHOS 5.1*  --  2.2* 6.1* 6.5* 5.3*  --   --   --   --   < > = values in this interval not displayed.  Recent Labs  11/08/16 1904 11/09/16 0420 02/16/17 0741 02/19/17 0511 02/20/17 0450 02/22/17 0454  AST 23 18 13*  --   --   --   ALT 19 17 13*  --   --   --   ALKPHOS 66 53 37*  --   --   --   BILITOT 1.0 1.0 1.6*  --   --   --   PROT 7.8 6.4* 5.2*  --   --   --   ALBUMIN 3.2* 2.7* 2.4* 2.2* 2.2* 2.4*    Recent Labs  02/16/17 0741  02/25/17 0900 02/28/17 0700 03/04/17 1140  WBC 11.3*  < > 16.4* 11.5* 11.5*  NEUTROABS 9.3*  --  13.6*  --  9.2*  HGB 7.3*  < > 9.3* 8.8* 8.2*  HCT 23.2*  < > 29.3* 27.8* 25.5*  MCV 94.7  < > 93.0 93.0 92.7  PLT 306  < > 345 404* 231  < > = values in this interval not displayed. Lab Results  Component Value Date   TSH 0.836 11/09/2016   No results found for: HGBA1C Lab Results  Component Value Date   TRIG 270 (H) 02/17/2017    Significant Diagnostic Results in last 30 days:  Ct Head Wo Contrast  Result Date: 02/16/2017 CLINICAL DATA:  Headache since a fall 2 weeks ago. The patient is on Coumadin. EXAM: CT HEAD WITHOUT CONTRAST TECHNIQUE: Contiguous axial images were obtained from the base of the skull through the vertex without intravenous contrast. COMPARISON:  None. FINDINGS: Brain: There is some cortical atrophy and chronic microvascular ischemic change. No evidence of acute abnormality including hemorrhage, infarct, mass lesion, mass effect, midline shift or abnormal extra-axial fluid collections identified. No hydrocephalus or pneumocephalus.  Vascular: Atherosclerosis noted. Skull: Intact. Sinuses/Orbits: No acute abnormality. Other: None. IMPRESSION: No acute abnormality. Mild atrophy and chronic microvascular ischemic change. Electronically Signed   By: Drusilla Kanner M.D.   On: 02/16/2017 09:07   Portable Chest Xray  Result Date: 02/18/2017 CLINICAL DATA:  Respiratory failure, shortness of breath. EXAM: PORTABLE CHEST 1 VIEW COMPARISON:  Radiograph of February 17, 2017. FINDINGS: Stable cardiomegaly. Atherosclerosis of thoracic aorta is noted. Endotracheal tube is in grossly good position. No pneumothorax is noted. Right internal jugular catheter is unchanged. Mild bibasilar edema or atelectasis is noted with possible associated pleural effusions. Bony thorax is unremarkable. IMPRESSION: Aortic atherosclerosis. Stable support apparatus. Mild bibasilar atelectasis or edema with possible associated pleural effusions. Electronically Signed   By: Lupita Raider, M.D.   On: 02/18/2017 07:34   Dg Chest Port 1 View  Result Date: 02/17/2017 CLINICAL DATA:  Respiratory failure.  Status post intubation today. EXAM: PORTABLE CHEST 1 VIEW COMPARISON:  Single-view of the chest earlier today. FINDINGS: New endotracheal tube is in place with the tip projecting in good position at the level of the clavicular heads. Right IJ catheter is again seen. NG tube has been removed. Left basilar airspace disease is unchanged.  There is cardiomegaly. No pneumothorax. IMPRESSION: Endotracheal tube projects in good position. No change in left basilar airspace disease. Cardiomegaly. Atherosclerosis. Electronically Signed   By: Drusilla Kanner M.D.   On: 02/17/2017 11:31   Dg Chest Port 1 View  Result Date: 02/17/2017 CLINICAL DATA:  Central line placement EXAM: PORTABLE CHEST 1 VIEW COMPARISON:  02/16/2017 FINDINGS: The patient is rotated. Moderate-to-marked cardiomegaly as before. Hazy atelectasis or infiltrate at the left base. Suspect tiny pleural effusions.  Atherosclerosis of the aorta. Esophageal tube tip is in the left upper quadrant. Right-sided central venous catheter tip overlies the cavoatrial junction. There is no pneumothorax. IMPRESSION: 1. Right-sided central venous catheter tip projects over the cavoatrial junction. No pneumothorax 2. Stable degree of cardiomegaly. Suspect tiny pleural effusions and hazy atelectasis or infiltrate at the left base. Electronically Signed   By: Jasmine Pang M.D.   On: 02/17/2017 01:00   Dg Chest Port 1 View  Result Date: 02/16/2017 CLINICAL DATA:  83 year old female undergoing nasogastric tube placement. EXAM: PORTABLE CHEST 1 VIEW COMPARISON:  Prior chest x-ray 11/08/2016 FINDINGS: Stable cardiomegaly. Mediastinal contours are unchanged. Atherosclerotic calcifications again noted in the transverse aorta. A gastric tube is present. The tip of the tube overlies the distal esophagus. No focal airspace consolidation, pleural effusion or pneumothorax. No suspicious nodule or mass. Stable mild vascular congestion and chronic bronchitic changes. No acute osseous abnormality. IMPRESSION: 1. Stable chest x-ray without evidence of acute cardiopulmonary process. 2. The tip of a gastric tube projects over the distal esophagus. Recommend advancing 02-2009 cm for placement in the stomach. 3. Stable cardiomegaly. 4.  Aortic Atherosclerosis (ICD10-170.0) Electronically Signed   By: Malachy Moan M.D.   On: 02/16/2017 11:56   Dg Abd Portable 1v  Result Date: 02/16/2017 CLINICAL DATA:  Patient status post enteric tube placement. EXAM: PORTABLE ABDOMEN - 1 VIEW COMPARISON:  Chest radiograph 02/16/2017 FINDINGS: Enteric tube tip and side-port project over the left upper quadrant. Nonspecific nondilated loops of bowel within the central abdomen and right lower quadrant. Cholecystectomy clips. Thoracic spine lower lumbar spine degenerative changes. IMPRESSION: Enteric tube tip and side-port project over the stomach. Electronically  Signed   By: Annia Belt M.D.   On: 02/16/2017 16:01    Assessment/Plan  1 discomfort neck---I do not see a recent x-ray will order a x-ray of her neck cervical spine as well as clavicle shoulders bilaterally.--Per review of Epic and do not see a previous x-ray.  I suspect she may have some arthritic degenerative changes here but would like to obtain x-rays for further insight.  For pain she continues on hydrocodone 5-3 25 mg every 12 hours when necessary-under codon apparently was recently reduced secondary to concerns of confusion.  She doesn't appear bright and alert today still wants to go home quite badly however.  #2 anemia with history of GI bleed- as noted above she has been started on iron update CBC is pending for early next week she is also on Protonix as well as treatment for H. pylori.  #3 atrial fibrillation at this point appears rate controlled I got a pulse of approximately 90 on exam today she is on Lopressor as well as Cardizem currently off Coumadin she is followed by cardiology.  [id number]

## 2017-03-09 DIAGNOSIS — N186 End stage renal disease: Secondary | ICD-10-CM | POA: Diagnosis not present

## 2017-03-09 DIAGNOSIS — Z992 Dependence on renal dialysis: Secondary | ICD-10-CM | POA: Diagnosis not present

## 2017-03-10 ENCOUNTER — Encounter (HOSPITAL_COMMUNITY)
Admission: AD | Admit: 2017-03-10 | Discharge: 2017-03-10 | Disposition: A | Discharge status: Home / Self Care | Payer: Medicare Other | Source: Skilled Nursing Facility

## 2017-03-10 LAB — OCCULT BLOOD X 1 CARD TO LAB, STOOL: Fecal Occult Bld: NEGATIVE

## 2017-03-11 ENCOUNTER — Encounter (HOSPITAL_COMMUNITY)
Admission: RE | Admit: 2017-03-11 | Discharge: 2017-03-11 | Disposition: A | Discharge status: Home / Self Care | Payer: Medicare Other | Source: Skilled Nursing Facility | Attending: Internal Medicine | Admitting: Internal Medicine

## 2017-03-11 DIAGNOSIS — M6281 Muscle weakness (generalized): Secondary | ICD-10-CM | POA: Insufficient documentation

## 2017-03-11 DIAGNOSIS — K920 Hematemesis: Secondary | ICD-10-CM | POA: Insufficient documentation

## 2017-03-11 DIAGNOSIS — I10 Essential (primary) hypertension: Secondary | ICD-10-CM | POA: Insufficient documentation

## 2017-03-11 DIAGNOSIS — R262 Difficulty in walking, not elsewhere classified: Secondary | ICD-10-CM | POA: Insufficient documentation

## 2017-03-11 DIAGNOSIS — N186 End stage renal disease: Secondary | ICD-10-CM | POA: Insufficient documentation

## 2017-03-11 DIAGNOSIS — N2581 Secondary hyperparathyroidism of renal origin: Secondary | ICD-10-CM | POA: Insufficient documentation

## 2017-03-11 DIAGNOSIS — K254 Chronic or unspecified gastric ulcer with hemorrhage: Secondary | ICD-10-CM | POA: Insufficient documentation

## 2017-03-11 LAB — BASIC METABOLIC PANEL
ANION GAP: 11 (ref 5–15)
BUN: 18 mg/dL (ref 6–20)
CALCIUM: 10.1 mg/dL (ref 8.9–10.3)
CO2: 28 mmol/L (ref 22–32)
CREATININE: 5.57 mg/dL — AB (ref 0.44–1.00)
Chloride: 99 mmol/L — ABNORMAL LOW (ref 101–111)
GFR, EST AFRICAN AMERICAN: 7 mL/min — AB (ref >60)
GFR, EST NON AFRICAN AMERICAN: 6 mL/min — AB (ref >60)
Glucose, Bld: 140 mg/dL — ABNORMAL HIGH (ref 65–99)
Potassium: 3.2 mmol/L — ABNORMAL LOW (ref 3.5–5.1)
SODIUM: 138 mmol/L (ref 135–145)

## 2017-03-11 LAB — CBC
HEMATOCRIT: 30.5 % — AB (ref 36.0–46.0)
Hemoglobin: 9.7 g/dL — ABNORMAL LOW (ref 12.0–15.0)
MCH: 29.9 pg (ref 26.0–34.0)
MCHC: 31.8 g/dL (ref 30.0–36.0)
MCV: 94.1 fL (ref 78.0–100.0)
PLATELETS: 254 10*3/uL (ref 150–400)
RBC: 3.24 MIL/uL — ABNORMAL LOW (ref 3.87–5.11)
RDW: 17.8 % — AB (ref 11.5–15.5)
WBC: 9.5 10*3/uL (ref 4.0–10.5)

## 2017-03-11 LAB — OCCULT BLOOD X 1 CARD TO LAB, STOOL: FECAL OCCULT BLD: NEGATIVE

## 2017-03-12 DIAGNOSIS — Z992 Dependence on renal dialysis: Secondary | ICD-10-CM | POA: Diagnosis not present

## 2017-03-12 DIAGNOSIS — N186 End stage renal disease: Secondary | ICD-10-CM | POA: Diagnosis not present

## 2017-03-14 ENCOUNTER — Ambulatory Visit: Payer: Medicare Other | Admitting: Gastroenterology

## 2017-03-14 ENCOUNTER — Encounter: Payer: Self-pay | Admitting: Internal Medicine

## 2017-03-14 DIAGNOSIS — N186 End stage renal disease: Secondary | ICD-10-CM | POA: Diagnosis not present

## 2017-03-14 DIAGNOSIS — Z992 Dependence on renal dialysis: Secondary | ICD-10-CM | POA: Diagnosis not present

## 2017-03-15 ENCOUNTER — Ambulatory Visit: Payer: Medicare Other | Admitting: Gastroenterology

## 2017-03-15 ENCOUNTER — Non-Acute Institutional Stay (SKILLED_NURSING_FACILITY): Payer: Medicare Other | Admitting: Internal Medicine

## 2017-03-15 ENCOUNTER — Encounter: Payer: Self-pay | Admitting: Internal Medicine

## 2017-03-15 DIAGNOSIS — I5033 Acute on chronic diastolic (congestive) heart failure: Secondary | ICD-10-CM | POA: Diagnosis not present

## 2017-03-15 DIAGNOSIS — N186 End stage renal disease: Secondary | ICD-10-CM | POA: Diagnosis not present

## 2017-03-15 DIAGNOSIS — I4891 Unspecified atrial fibrillation: Secondary | ICD-10-CM | POA: Diagnosis not present

## 2017-03-15 DIAGNOSIS — D62 Acute posthemorrhagic anemia: Secondary | ICD-10-CM | POA: Diagnosis not present

## 2017-03-15 NOTE — Progress Notes (Signed)
This encounter was created in error - please disregard.

## 2017-03-15 NOTE — Progress Notes (Signed)
Location:   [facility] Room Number: 144/P Place of Service:  SNF (31)  Provider: Einar Crow  PCP: [name], MD Patient Care Team: [name], MD as PCP - General (Internal Medicine) [name], MD as Attending Physician (Nephrology)  Extended Emergency Contact Information Primary Emergency Contact: Sosa,Willie Address: [address]          [place], [place] [address] [place] Home Phone: [phone] Relation: Spouse Secondary Emergency Contact: Gweneth Dimitri, [place] [address] [place] Home Phone: [phone] Relation: Daughter  Code Status: Full Code Goals of care:  Advanced Directive information Advanced Directives 03/15/2017  Does Patient Have a Medical Advance Directive? Yes  Type of Advance Directive (No Data)  Does patient want to make changes to medical advance directive? No - Patient declined  Would patient like information on creating a medical advance directive? No - Patient declined  Pre-existing out of facility DNR order (yellow form or pink MOST form) -     No Known Allergies  Chief Complaint  Patient presents with  . Discharge Note    HPI:  83 y.o. female  seen today for discharge from facility.  She was admitted here in late April for an acute GI bleed.  She also has a history of end-stage renal disease on hemodialysis-new onset atrial fibrillation no longer a Coumadin.  She was most recently hospitalized after having increased leg pain dizziness and weakness.  Also passing black tarry stool.  Her hemoglobin was noted to be 7.1 INR of 4.1.  She actually required intubation in the hospital.  Workup showed a gastric ulcer it was clipped and injected.  She was also positive for H. pylori has completed treatment with that with Pepto-Bismol doxycycline and Flagyl.  Her stay here has been somewhat complicated apparently some concerns for increased anxiety and  confusion Dr. Chales Abrahams did discontinue her Xanax and reduced her hydrocodone dose in this appears to have a beneficial effect.  Her hemoglobin appears to be somewhat improved on most recent lab at 9.7 on lab done May 14.  She was started on iron by Dr. Chales Abrahams.  Apparently her tarry stools have improved somewhat.  Of note she is also on Protonix.  She was also seen recently for possibly increased neck pain x-rays did not really show any acute process she is not really complaining of neck pain this morning  Currently she has no complaints other than feeling somewhat weak-she is looking forward to going home she will be going home with her husband was in the room and is very supportive.  She will need continued PT and OT for strengthening.  I do note on lab done on May 14 her potassium was slightly low at 3.2 and she is followed by dialysis.      Past Medical History:  Diagnosis Date  . Anemia 02/11/2012  . Anxiety   . Atrial fibrillation (HCC)   . CHF (congestive heart failure) (HCC)   . Chronic kidney disease    not on dialysis yet, Tues, thurs, sat  . GERD (gastroesophageal reflux disease)   . H/O metabolic acidosis   . Hyperlipidemia   . Hyperparathyroidism, secondary (HCC)   . Hypertension    Dr. Regino Schultze, [place], [place]  . Oxygen dependent   . Vitamin D deficiency     Past Surgical History:  Procedure Laterality Date  . ABDOMINAL HYSTERECTOMY    . AV FISTULA PLACEMENT  04/01/2012   Procedure: ARTERIOVENOUS (AV) FISTULA CREATION;  Surgeon: Sherren Kerns, MD;  Location: [facility] OR;  Service: Vascular;  Laterality: Left;  . CHOLECYSTECTOMY    . COLON SURGERY     for a blockage  . ESOPHAGOGASTRODUODENOSCOPY N/A 02/17/2017   Procedure: ESOPHAGOGASTRODUODENOSCOPY (EGD);  Surgeon: Sherrilyn Rist, MD;  Location: [facility] ENDOSCOPY;  Service: Endoscopy;  Laterality: N/A;  Bedside case in ICU  . EYE SURGERY     bilateral cataracts, /w IOL - 2013  . FISTULOGRAM N/A 11/05/2014    Procedure: FISTULOGRAM;  Surgeon: Larina Earthly, MD;  Location: [facility] CATH LAB;  Service: Cardiovascular;  Laterality: N/A;  . INSERTION OF DIALYSIS CATHETER  04/10/2012   Procedure: INSERTION OF DIALYSIS CATHETER;  Surgeon: Pryor Ochoa, MD;  Location: [facility] OR;  Service: Vascular;  Laterality: N/A;  Insertion Diatek Catheter Right Internal Jugular  . LIGATION OF COMPETING BRANCHES OF ARTERIOVENOUS FISTULA  11/07/2012   Procedure: LIGATION OF COMPETING BRANCHES OF ARTERIOVENOUS FISTULA;  Surgeon: Chuck Hint, MD;  Location: [facility] OR;  Service: Vascular;  Laterality: Left;  Brachio/Cephalic Fistula  . SHUNTOGRAM N/A 11/03/2012   Procedure: Betsey Amen;  Surgeon: Chuck Hint, MD;  Location: [facility] CATH LAB;  Service: Cardiovascular;  Laterality: N/A;  . SHUNTOGRAM Left 03/09/2013   Procedure: Fistulogram;  Surgeon: Fransisco Hertz, MD;  Location: [facility] CATH LAB;  Service: Cardiovascular;  Laterality: Left;  . SHUNTOGRAM Left 11/23/2013   Procedure: FISTULOGRAM;  Surgeon: Fransisco Hertz, MD;  Location: [facility] CATH LAB;  Service: Cardiovascular;  Laterality: Left;      reports that she has never smoked. She has never used smokeless tobacco. She reports that she does not drink alcohol or use drugs. Social History   Social History  . Marital status: Married    Spouse name: N/A  . Number of children: N/A  . Years of education: N/A   Occupational History  . Not on file.   Social History Main Topics  . Smoking status: Never Smoker  . Smokeless tobacco: Never Used  . Alcohol use No  . Drug use: No  . Sexual activity: Not on file   Other Topics Concern  . Not on file   Social History Narrative  . No narrative on file   Functional Status Survey:    No Known Allergies  Pertinent  Health Maintenance Due  Topic Date Due  . DEXA SCAN  11/22/1995  . PNA vac Low Risk Adult (2 of 2 - PCV13) 04/10/2017  . INFLUENZA VACCINE  05/29/2017    Medications: Outpatient Encounter Prescriptions as of  03/15/2017  Medication Sig  . acetaminophen (TYLENOL) 325 MG tablet Take 650 mg by mouth every 4 (four) hours as needed.  . Biotin 1 MG CAPS Take 1 mg by mouth daily.  Marland Kitchen diltiazem (CARDIZEM) 120 MG tablet Take 120 mg by mouth daily.  . ferrous sulfate 325 (65 FE) MG tablet Take 325 mg by mouth daily with breakfast.  . HYDROcodone-acetaminophen (NORCO/VICODIN) 5-325 MG tablet Take 1 tablet by mouth every 12 (twelve) hours as needed for moderate pain.   Marland Kitchen ipratropium-albuterol (DUONEB) 0.5-2.5 (3) MG/3ML SOLN Take 3 mLs by nebulization every 6 (six) hours as needed.  . metoprolol tartrate (LOPRESSOR) 25 MG tablet Take 1 tablet (25 mg total) by mouth 2 (two) times daily.  . multivitamin (RENA-VIT) TABS tablet Take 1 tablet by mouth daily.  . pantoprazole (PROTONIX) 40 MG tablet Take 1 tablet (40 mg total) by mouth 2 (  two) times daily.  . pentoxifylline (TRENTAL) 400 MG CR tablet Take 400 mg by mouth 3 (three) times daily with meals.  . promethazine (PHENERGAN) 25 MG tablet Take 25 mg by mouth every 6 (six) hours as needed for nausea or vomiting.  . sevelamer carbonate (RENVELA) 800 MG tablet Take 1,600 mg by mouth 3 (three) times daily with meals. Take 800mg  with snacks daily.   . simvastatin (ZOCOR) 10 MG tablet Take 10 mg by mouth daily.  . [DISCONTINUED] diltiazem (CARDIZEM CD) 120 MG 24 hr capsule Take 2 tablets in the am ( 240 mg)  And take 1 tablet (120 mg) at night   No facility-administered encounter medications on file as of 03/15/2017.      Review of Systems Gen. she is not complaining of any fever or chills.  Skin does not complain of increased rashes itching or bruising. Dialysis shunt is present on the left upper arm  Head ears nose mouth throat does not claim sore throat or visual changes.  Respiratory is not complaining of shortness of breath or increased cough.  Cardiac does not complaining of chest pain or palpitations as what appears to be chronic lower extremity  edema.  GI is not complaining of abdominal pain nausea vomiting diarrhea constipation says her appetite is not very good but attributes that to the food  GU does not complain of dysuria she has end-stage renal disease on dialysis.  Musculoskeletal does still have some weakness and complains of generalized arthritic pain  Is not really complaining of neck pain today  Neurologic does not complain of headache or syncope. Or numbness--says occasionally she will feel dizzy but says this is not new  Psych does not complain of overt anxiety or depression does not really appear anxious or confused today Vitals:   03/15/17 0957  BP: (!) 158/90  Pulse: 100  Resp: 20  Temp: 97.3 F (36.3 C)  TempSrc: Oral  Note manual blood pressure was 150/82-her pulse was 80  Physical Exam Constitutional: She is oriented to person, place, and time. She appears well-developedand well-nourished. Sitting comfortably in her wheelchair  HENT:  Head: Normocephalic.  Mouth/Throat: Oropharynx is clear and moist.  Eyes: Pupils are equal, round, and reactive to light. Visual acuity appears grossly intact Neck: Neck supple--no deformities noted   Cardiovascular: Normal rateand normal heart sounds. An irregularly irregular rhythmpresent.  No murmurheard. Pulse of 80 on auscultation Pulmonary/Chest: Effort normaland breath sounds normal. No respiratory distress. She has no wheezes. She has no rales.  Abdominal: Soft. Bowel sounds are normal. She exhibits no distension. There is no tenderness. There is no rebound.  Musculoskeletal:  has lower leg edema trace-one plus  Lymphadenopathy:  She has no cervical adenopathy.  Neurological: She is alertand oriented to person, place, and time.  No focal deficitstrength appears to be intact bilaterally able to raise her arms bilaterally without difficulty Skin: Skin is warmand dry. Bruitis appreciated shunt upper left arm She is able to stand and walk with  a walker without much difficulty  Psychiatric: She has a normal mood and affect. Her behavior is normal. Thought contentnormal. She is pleasant and appropriate looking forward to going home  Labs reviewed: Basic Metabolic Panel:  Recent Labs  13/08/65 1333  02/18/17 0459 02/19/17 0511 02/20/17 0450 02/22/17 0454  03/01/17 0700 03/04/17 1140 03/11/17 0700  NA  --   < > 137 137 135 139  < > 138 137 138  K  --   < > 2.9*  4.6 4.0 3.6  < > 3.0* 3.7 3.2*  CL  --   < > 100* 101 97* 99*  < > 98* 99* 99*  CO2  --   < > 25 23 28 29   < > 28 29 28   GLUCOSE  --   < > 107* 148* 147* 88  < > 122* 110* 140*  BUN  --   < > 47* 76* 40* 33*  < > 15 21* 18  CREATININE  --   < > 3.06* 5.08* 3.31* 3.64*  < > 5.47* 7.23* 5.57*  CALCIUM  --   < > 8.8* 8.8* 8.8* 8.6*  < > 9.8 9.6 10.1  MG 1.5*  --  1.7  --   --   --   --   --   --   --   PHOS 5.1*  --  2.2* 6.1* 6.5* 5.3*  --   --   --   --   < > = values in this interval not displayed. Liver Function Tests:  Recent Labs  11/08/16 1904 11/09/16 0420 02/16/17 0741 02/19/17 0511 02/20/17 0450 02/22/17 0454  AST 23 18 13*  --   --   --   ALT 19 17 13*  --   --   --   ALKPHOS 66 53 37*  --   --   --   BILITOT 1.0 1.0 1.6*  --   --   --   PROT 7.8 6.4* 5.2*  --   --   --   ALBUMIN 3.2* 2.7* 2.4* 2.2* 2.2* 2.4*    Recent Labs  02/16/17 0741  LIPASE 39   No results for input(s): AMMONIA in the last 8760 hours. CBC:  Recent Labs  02/16/17 0741  02/25/17 0900 02/28/17 0700 03/04/17 1140 03/11/17 0700  WBC 11.3*  < > 16.4* 11.5* 11.5* 9.5  NEUTROABS 9.3*  --  13.6*  --  9.2*  --   HGB 7.3*  < > 9.3* 8.8* 8.2* 9.7*  HCT 23.2*  < > 29.3* 27.8* 25.5* 30.5*  MCV 94.7  < > 93.0 93.0 92.7 94.1  PLT 306  < > 345 404* 231 254  < > = values in this interval not displayed. Cardiac Enzymes:  Recent Labs  02/17/17 0430 02/17/17 1030 02/17/17 1430  TROPONINI 0.03* 0.04* 0.03*   BNP: Invalid input(s): POCBNP CBG:  Recent Labs   02/16/17 1935 02/17/17 0024 02/17/17 0417  GLUCAP 87 105* 148*    Procedures and Imaging Studies During Stay: Ct Head Wo Contrast  Result Date: 02/16/2017 CLINICAL DATA:  Headache since a fall 2 weeks ago. The patient is on Coumadin. EXAM: CT HEAD WITHOUT CONTRAST TECHNIQUE: Contiguous axial images were obtained from the base of the skull through the vertex without intravenous contrast. COMPARISON:  None. FINDINGS: Brain: There is some cortical atrophy and chronic microvascular ischemic change. No evidence of acute abnormality including hemorrhage, infarct, mass lesion, mass effect, midline shift or abnormal extra-axial fluid collections identified. No hydrocephalus or pneumocephalus. Vascular: Atherosclerosis noted. Skull: Intact. Sinuses/Orbits: No acute abnormality. Other: None. IMPRESSION: No acute abnormality. Mild atrophy and chronic microvascular ischemic change. Electronically Signed   By: Drusilla Kanner M.D.   On: 02/16/2017 09:07   Portable Chest Xray  Result Date: 02/18/2017 CLINICAL DATA:  Respiratory failure, shortness of breath. EXAM: PORTABLE CHEST 1 VIEW COMPARISON:  Radiograph of February 17, 2017. FINDINGS: Stable cardiomegaly. Atherosclerosis of thoracic aorta is noted. Endotracheal tube is in grossly  good position. No pneumothorax is noted. Right internal jugular catheter is unchanged. Mild bibasilar edema or atelectasis is noted with possible associated pleural effusions. Bony thorax is unremarkable. IMPRESSION: Aortic atherosclerosis. Stable support apparatus. Mild bibasilar atelectasis or edema with possible associated pleural effusions. Electronically Signed   By: Lupita Raider, M.D.   On: 02/18/2017 07:34   Dg Chest Port 1 View  Result Date: 02/17/2017 CLINICAL DATA:  Respiratory failure.  Status post intubation today. EXAM: PORTABLE CHEST 1 VIEW COMPARISON:  Single-view of the chest earlier today. FINDINGS: New endotracheal tube is in place with the tip projecting in good  position at the level of the clavicular heads. Right IJ catheter is again seen. NG tube has been removed. Left basilar airspace disease is unchanged. There is cardiomegaly. No pneumothorax. IMPRESSION: Endotracheal tube projects in good position. No change in left basilar airspace disease. Cardiomegaly. Atherosclerosis. Electronically Signed   By: Drusilla Kanner M.D.   On: 02/17/2017 11:31   Dg Chest Port 1 View  Result Date: 02/17/2017 CLINICAL DATA:  Central line placement EXAM: PORTABLE CHEST 1 VIEW COMPARISON:  02/16/2017 FINDINGS: The patient is rotated. Moderate-to-marked cardiomegaly as before. Hazy atelectasis or infiltrate at the left base. Suspect tiny pleural effusions. Atherosclerosis of the aorta. Esophageal tube tip is in the left upper quadrant. Right-sided central venous catheter tip overlies the cavoatrial junction. There is no pneumothorax. IMPRESSION: 1. Right-sided central venous catheter tip projects over the cavoatrial junction. No pneumothorax 2. Stable degree of cardiomegaly. Suspect tiny pleural effusions and hazy atelectasis or infiltrate at the left base. Electronically Signed   By: Jasmine Pang M.D.   On: 02/17/2017 01:00   Dg Chest Port 1 View  Result Date: 02/16/2017 CLINICAL DATA:  83 year old female undergoing nasogastric tube placement. EXAM: PORTABLE CHEST 1 VIEW COMPARISON:  Prior chest x-ray 11/08/2016 FINDINGS: Stable cardiomegaly. Mediastinal contours are unchanged. Atherosclerotic calcifications again noted in the transverse aorta. A gastric tube is present. The tip of the tube overlies the distal esophagus. No focal airspace consolidation, pleural effusion or pneumothorax. No suspicious nodule or mass. Stable mild vascular congestion and chronic bronchitic changes. No acute osseous abnormality. IMPRESSION: 1. Stable chest x-ray without evidence of acute cardiopulmonary process. 2. The tip of a gastric tube projects over the distal esophagus. Recommend advancing  02-2009 cm for placement in the stomach. 3. Stable cardiomegaly. 4.  Aortic Atherosclerosis (ICD10-170.0) Electronically Signed   By: Malachy Moan M.D.   On: 02/16/2017 11:56   Dg Abd Portable 1v  Result Date: 02/16/2017 CLINICAL DATA:  Patient status post enteric tube placement. EXAM: PORTABLE ABDOMEN - 1 VIEW COMPARISON:  Chest radiograph 02/16/2017 FINDINGS: Enteric tube tip and side-port project over the left upper quadrant. Nonspecific nondilated loops of bowel within the central abdomen and right lower quadrant. Cholecystectomy clips. Thoracic spine lower lumbar spine degenerative changes. IMPRESSION: Enteric tube tip and side-port project over the stomach. Electronically Signed   By: Annia Belt M.D.   On: 02/16/2017 16:01    Assessment/Plan:    #1 history of GI bleed anemia-hemoglobin actually has improved she has been started on iron has completed treatment for H. pylori-she continues on Protonix as well.  She will need follow-up by GI.  #2 history of atrial fibrillation-this appears stable on Cardizem and Lopressor- I see a listed pulse of 100 I got 80 on exam she does not appear to have consistent pulses above 100 from what I see. This appears to be stable.  #3 history of  end-stage renal disease on dialysis-she does receive dialysis 3 times a week and noted potassium is slightly low at 3.20 most recent lab she is followed by dialysis.  #4 history of claudication continues on Trental-also has hydrocodone as needed for pain.  #5 history of confusion and anxiety this appears improved she is now off the Xanax and hydrocodone dose was reduced because of confusion his appears to have had a beneficial effect.  #6 history of hypertension she is on the Cardizem and Lopressor-I see systolic at times is somewhat elevated-will defer to primary care provider-I with dialysis I suspect there is still concern at times for hypotension again will defer to dialysis nephrology as well as her primary  care provider.  #7 high history of hyperlipidemia she is on Zocor since her stay here was quite short will defer to primary care provider for follow-up.  #8 history of diastolic CHF her weights actually down about 7 pounds since her admission here again she is followed by nephrology and dialysis clinically appears to be stable in this regards  Again she will be going home with her husband will need continued PT and OT but appears to have improved although she still has some weakness here which would warrant additional therapy.  Also will need GI follow-up and will continue dialysis.  [id number] note greater than 30 minutes spent on this discharge summary-greater than 50% of time spent coordinating plan of care for numerous diagnoses

## 2017-03-16 DIAGNOSIS — Z992 Dependence on renal dialysis: Secondary | ICD-10-CM | POA: Diagnosis not present

## 2017-03-16 DIAGNOSIS — N186 End stage renal disease: Secondary | ICD-10-CM | POA: Diagnosis not present

## 2017-03-19 DIAGNOSIS — N186 End stage renal disease: Secondary | ICD-10-CM | POA: Diagnosis not present

## 2017-03-19 DIAGNOSIS — Z992 Dependence on renal dialysis: Secondary | ICD-10-CM | POA: Diagnosis not present

## 2017-03-21 DIAGNOSIS — Z992 Dependence on renal dialysis: Secondary | ICD-10-CM | POA: Diagnosis not present

## 2017-03-21 DIAGNOSIS — N186 End stage renal disease: Secondary | ICD-10-CM | POA: Diagnosis not present

## 2017-03-23 DIAGNOSIS — N186 End stage renal disease: Secondary | ICD-10-CM | POA: Diagnosis not present

## 2017-03-23 DIAGNOSIS — Z992 Dependence on renal dialysis: Secondary | ICD-10-CM | POA: Diagnosis not present

## 2017-03-25 ENCOUNTER — Other Ambulatory Visit: Payer: Self-pay | Admitting: Cardiovascular Disease

## 2017-03-26 DIAGNOSIS — Z992 Dependence on renal dialysis: Secondary | ICD-10-CM | POA: Diagnosis not present

## 2017-03-26 DIAGNOSIS — N186 End stage renal disease: Secondary | ICD-10-CM | POA: Diagnosis not present

## 2017-03-28 DIAGNOSIS — N184 Chronic kidney disease, stage 4 (severe): Secondary | ICD-10-CM | POA: Diagnosis not present

## 2017-03-28 DIAGNOSIS — N186 End stage renal disease: Secondary | ICD-10-CM | POA: Diagnosis not present

## 2017-03-28 DIAGNOSIS — J42 Unspecified chronic bronchitis: Secondary | ICD-10-CM | POA: Diagnosis not present

## 2017-03-28 DIAGNOSIS — Z992 Dependence on renal dialysis: Secondary | ICD-10-CM | POA: Diagnosis not present

## 2017-03-29 DIAGNOSIS — N186 End stage renal disease: Secondary | ICD-10-CM | POA: Diagnosis not present

## 2017-03-29 DIAGNOSIS — Z992 Dependence on renal dialysis: Secondary | ICD-10-CM | POA: Diagnosis not present

## 2017-03-30 DIAGNOSIS — Z992 Dependence on renal dialysis: Secondary | ICD-10-CM | POA: Diagnosis not present

## 2017-03-30 DIAGNOSIS — N186 End stage renal disease: Secondary | ICD-10-CM | POA: Diagnosis not present

## 2017-04-01 DIAGNOSIS — I10 Essential (primary) hypertension: Secondary | ICD-10-CM | POA: Diagnosis not present

## 2017-04-01 DIAGNOSIS — K274 Chronic or unspecified peptic ulcer, site unspecified, with hemorrhage: Secondary | ICD-10-CM | POA: Diagnosis not present

## 2017-04-01 DIAGNOSIS — Z1389 Encounter for screening for other disorder: Secondary | ICD-10-CM | POA: Diagnosis not present

## 2017-04-01 DIAGNOSIS — M47812 Spondylosis without myelopathy or radiculopathy, cervical region: Secondary | ICD-10-CM | POA: Diagnosis not present

## 2017-04-01 DIAGNOSIS — R269 Unspecified abnormalities of gait and mobility: Secondary | ICD-10-CM | POA: Diagnosis not present

## 2017-04-01 DIAGNOSIS — D649 Anemia, unspecified: Secondary | ICD-10-CM | POA: Diagnosis not present

## 2017-04-02 DIAGNOSIS — Z992 Dependence on renal dialysis: Secondary | ICD-10-CM | POA: Diagnosis not present

## 2017-04-02 DIAGNOSIS — N186 End stage renal disease: Secondary | ICD-10-CM | POA: Diagnosis not present

## 2017-04-03 ENCOUNTER — Ambulatory Visit (INDEPENDENT_AMBULATORY_CARE_PROVIDER_SITE_OTHER): Payer: Medicare Other | Admitting: Gastroenterology

## 2017-04-03 ENCOUNTER — Encounter: Payer: Self-pay | Admitting: Gastroenterology

## 2017-04-03 VITALS — BP 140/80 | HR 72 | Ht 64.0 in | Wt 160.0 lb

## 2017-04-03 DIAGNOSIS — D62 Acute posthemorrhagic anemia: Secondary | ICD-10-CM | POA: Diagnosis not present

## 2017-04-03 DIAGNOSIS — N186 End stage renal disease: Secondary | ICD-10-CM | POA: Diagnosis not present

## 2017-04-03 DIAGNOSIS — A048 Other specified bacterial intestinal infections: Secondary | ICD-10-CM

## 2017-04-03 DIAGNOSIS — K254 Chronic or unspecified gastric ulcer with hemorrhage: Secondary | ICD-10-CM | POA: Diagnosis not present

## 2017-04-03 NOTE — Progress Notes (Signed)
     [facility] Progress Note  Chief Complaint: Gastric ulcer with bleeding  Subjective  History:  Naylene sees me for the first time since her hospital stay in late April. At that time, she was transferred from an outside institution for large volume upper GI bleed while on anticoagulation for A. fib. A prolonged EGD finally discovered bleeding gastric ulcer that required injection and clip. She required further critical care and finally was extubated. She was found to be positive for H. pylori and was treated with antibiotics. She had been taking Aleve regularly prior to that hospital stay, and has since stopped. Her daughter brings her to the clinic today and gives most of the history since Katelinn seems to have some trouble with her memory. Camesha spent some time at rehabilitation but is now cared for at home by family. She denies abdominal pain, melena hematemesis anorexia or weight loss. I reviewed her cardiology note from early May, and they agree that anticoagulation should be permanently discontinued.  ROS: Cardiovascular:  no chest pain Respiratory: no dyspnea  The patient's Past Medical, Family and Social History were reviewed and are on file in the EMR.  Objective:  Med list reviewed  Vital signs in last 24 hrs: Vitals:   04/03/17 1354  BP: 140/80  Pulse: 72    Physical Exam  Feeble elderly woman in a wheelchair  HEENT: sclera anicteric, oral mucosa moist without lesions  Neck: supple, no thyromegaly, JVD or lymphadenopathy  Cardiac: Irregular without murmurs, S1S2 heard, 2+ peripheral edema  Pulm: clear to auscultation bilaterally, normal RR and effort noted  Abdomen: soft, no tenderness, with active bowel sounds. No guarding or palpable hepatosplenomegaly.  Skin; warm and dry, no jaundice or rash  Recent Labs:  Last known hemoglobin 9.0  @ASSESSMENTPLANBEGIN @ Assessment: Encounter Diagnoses  Name Primary?  . Chronic gastric ulcer with hemorrhage  Yes  . ESRD (end stage renal disease) (HCC)   . Acute posthemorrhagic anemia   . Bacterial infection due to Helicobacter pylori     She has no further overt GI bleeding. Her H. pylori was treated, I do not think it will be logistically feasible for her to have a stool antigen or breath test to confirm eradication. I do not feel she should be put through an upper endoscopy to confirm ulcer healing or biopsy for H. pylori, given her overall feeble condition.  Plan: Discontinue PPI when the current month supply runs out. I have asked her daughter to address with primary care or her nephrologist giving Estephany IV iron treatments during dialysis.(We were unable to determine Laparis's primary care provider or nephrologist at the visit today in order to send him a copy of my note. Family said they would get back to [place] with that information)   Total time 15 minutes, over half spent in counseling and coordination of care.   Charlie Pitter III

## 2017-04-03 NOTE — Patient Instructions (Signed)
Please discuss IV treatment of iron with your kidney doctor.  Stop using Protonix after current prescription runs out.  If you are age 81 or older, your body mass index should be between 23-30. Your Body mass index is 27.46 kg/m. If this is out of the aforementioned range listed, please consider follow up with your Primary Care Provider.  If you are age 10 or younger, your body mass index should be between 19-25. Your Body mass index is 27.46 kg/m. If this is out of the aformentioned range listed, please consider follow up with your Primary Care Provider.   Thank you for choosing [facility]  Dr Amada Jupiter III

## 2017-04-06 DIAGNOSIS — Z992 Dependence on renal dialysis: Secondary | ICD-10-CM | POA: Diagnosis not present

## 2017-04-06 DIAGNOSIS — N186 End stage renal disease: Secondary | ICD-10-CM | POA: Diagnosis not present

## 2017-04-09 DIAGNOSIS — N186 End stage renal disease: Secondary | ICD-10-CM | POA: Diagnosis not present

## 2017-04-09 DIAGNOSIS — Z992 Dependence on renal dialysis: Secondary | ICD-10-CM | POA: Diagnosis not present

## 2017-04-11 DIAGNOSIS — Z992 Dependence on renal dialysis: Secondary | ICD-10-CM | POA: Diagnosis not present

## 2017-04-11 DIAGNOSIS — N186 End stage renal disease: Secondary | ICD-10-CM | POA: Diagnosis not present

## 2017-04-13 DIAGNOSIS — Z992 Dependence on renal dialysis: Secondary | ICD-10-CM | POA: Diagnosis not present

## 2017-04-13 DIAGNOSIS — N186 End stage renal disease: Secondary | ICD-10-CM | POA: Diagnosis not present

## 2017-04-16 DIAGNOSIS — N186 End stage renal disease: Secondary | ICD-10-CM | POA: Diagnosis not present

## 2017-04-16 DIAGNOSIS — Z992 Dependence on renal dialysis: Secondary | ICD-10-CM | POA: Diagnosis not present

## 2017-04-18 DIAGNOSIS — Z992 Dependence on renal dialysis: Secondary | ICD-10-CM | POA: Diagnosis not present

## 2017-04-18 DIAGNOSIS — N186 End stage renal disease: Secondary | ICD-10-CM | POA: Diagnosis not present

## 2017-04-20 DIAGNOSIS — Z992 Dependence on renal dialysis: Secondary | ICD-10-CM | POA: Diagnosis not present

## 2017-04-20 DIAGNOSIS — N186 End stage renal disease: Secondary | ICD-10-CM | POA: Diagnosis not present

## 2017-04-23 DIAGNOSIS — N186 End stage renal disease: Secondary | ICD-10-CM | POA: Diagnosis not present

## 2017-04-23 DIAGNOSIS — Z992 Dependence on renal dialysis: Secondary | ICD-10-CM | POA: Diagnosis not present

## 2017-04-25 DIAGNOSIS — N186 End stage renal disease: Secondary | ICD-10-CM | POA: Diagnosis not present

## 2017-04-25 DIAGNOSIS — M5481 Occipital neuralgia: Secondary | ICD-10-CM | POA: Diagnosis not present

## 2017-04-25 DIAGNOSIS — Z992 Dependence on renal dialysis: Secondary | ICD-10-CM | POA: Diagnosis not present

## 2017-04-25 DIAGNOSIS — I4891 Unspecified atrial fibrillation: Secondary | ICD-10-CM | POA: Diagnosis not present

## 2017-04-25 DIAGNOSIS — M47812 Spondylosis without myelopathy or radiculopathy, cervical region: Secondary | ICD-10-CM | POA: Diagnosis not present

## 2017-04-26 ENCOUNTER — Other Ambulatory Visit: Payer: Self-pay | Admitting: *Deleted

## 2017-04-26 MED ORDER — METOPROLOL TARTRATE 25 MG PO TABS: 25.0000 mg | ORAL_TABLET | Freq: Two times a day (BID) | ORAL | 1 refills | Status: DC

## 2017-04-26 MED ORDER — DILTIAZEM HCL ER COATED BEADS 120 MG PO CP24: ORAL_CAPSULE | ORAL | 6 refills | Status: DC

## 2017-04-27 DIAGNOSIS — Z992 Dependence on renal dialysis: Secondary | ICD-10-CM | POA: Diagnosis not present

## 2017-04-27 DIAGNOSIS — J42 Unspecified chronic bronchitis: Secondary | ICD-10-CM | POA: Diagnosis not present

## 2017-04-27 DIAGNOSIS — N186 End stage renal disease: Secondary | ICD-10-CM | POA: Diagnosis not present

## 2017-04-28 DIAGNOSIS — N186 End stage renal disease: Secondary | ICD-10-CM | POA: Diagnosis not present

## 2017-04-28 DIAGNOSIS — Z992 Dependence on renal dialysis: Secondary | ICD-10-CM | POA: Diagnosis not present

## 2017-04-29 DIAGNOSIS — N186 End stage renal disease: Secondary | ICD-10-CM | POA: Diagnosis not present

## 2017-04-29 DIAGNOSIS — Z992 Dependence on renal dialysis: Secondary | ICD-10-CM | POA: Diagnosis not present

## 2017-04-30 DIAGNOSIS — N186 End stage renal disease: Secondary | ICD-10-CM | POA: Diagnosis not present

## 2017-04-30 DIAGNOSIS — Z992 Dependence on renal dialysis: Secondary | ICD-10-CM | POA: Diagnosis not present

## 2017-05-02 DIAGNOSIS — N186 End stage renal disease: Secondary | ICD-10-CM | POA: Diagnosis not present

## 2017-05-02 DIAGNOSIS — Z992 Dependence on renal dialysis: Secondary | ICD-10-CM | POA: Diagnosis not present

## 2017-05-04 ENCOUNTER — Inpatient Hospital Stay (HOSPITAL_COMMUNITY)
Admission: EM | Admit: 2017-05-04 | Discharge: 2017-05-08 | DRG: 880 | Disposition: A | Discharge status: Home / Self Care | Payer: Medicare Other | Attending: Internal Medicine | Admitting: Internal Medicine

## 2017-05-04 ENCOUNTER — Emergency Department (HOSPITAL_COMMUNITY): Payer: Medicare Other

## 2017-05-04 ENCOUNTER — Encounter (HOSPITAL_COMMUNITY): Payer: Self-pay | Admitting: *Deleted

## 2017-05-04 DIAGNOSIS — T40605A Adverse effect of unspecified narcotics, initial encounter: Secondary | ICD-10-CM | POA: Diagnosis present

## 2017-05-04 DIAGNOSIS — F05 Delirium due to known physiological condition: Secondary | ICD-10-CM | POA: Diagnosis present

## 2017-05-04 DIAGNOSIS — N186 End stage renal disease: Secondary | ICD-10-CM | POA: Diagnosis not present

## 2017-05-04 DIAGNOSIS — E785 Hyperlipidemia, unspecified: Secondary | ICD-10-CM | POA: Diagnosis present

## 2017-05-04 DIAGNOSIS — D631 Anemia in chronic kidney disease: Secondary | ICD-10-CM | POA: Diagnosis present

## 2017-05-04 DIAGNOSIS — K219 Gastro-esophageal reflux disease without esophagitis: Secondary | ICD-10-CM | POA: Diagnosis not present

## 2017-05-04 DIAGNOSIS — G8929 Other chronic pain: Secondary | ICD-10-CM | POA: Diagnosis present

## 2017-05-04 DIAGNOSIS — F411 Generalized anxiety disorder: Secondary | ICD-10-CM | POA: Diagnosis present

## 2017-05-04 DIAGNOSIS — M5030 Other cervical disc degeneration, unspecified cervical region: Secondary | ICD-10-CM | POA: Diagnosis present

## 2017-05-04 DIAGNOSIS — Z86718 Personal history of other venous thrombosis and embolism: Secondary | ICD-10-CM

## 2017-05-04 DIAGNOSIS — I4891 Unspecified atrial fibrillation: Secondary | ICD-10-CM | POA: Diagnosis present

## 2017-05-04 DIAGNOSIS — M50322 Other cervical disc degeneration at C5-C6 level: Secondary | ICD-10-CM | POA: Diagnosis not present

## 2017-05-04 DIAGNOSIS — R0602 Shortness of breath: Secondary | ICD-10-CM | POA: Diagnosis not present

## 2017-05-04 DIAGNOSIS — Z9981 Dependence on supplemental oxygen: Secondary | ICD-10-CM

## 2017-05-04 DIAGNOSIS — I5033 Acute on chronic diastolic (congestive) heart failure: Secondary | ICD-10-CM | POA: Diagnosis not present

## 2017-05-04 DIAGNOSIS — M50321 Other cervical disc degeneration at C4-C5 level: Secondary | ICD-10-CM | POA: Diagnosis not present

## 2017-05-04 DIAGNOSIS — I132 Hypertensive heart and chronic kidney disease with heart failure and with stage 5 chronic kidney disease, or end stage renal disease: Secondary | ICD-10-CM | POA: Diagnosis not present

## 2017-05-04 DIAGNOSIS — M50323 Other cervical disc degeneration at C6-C7 level: Secondary | ICD-10-CM | POA: Diagnosis not present

## 2017-05-04 DIAGNOSIS — Z79899 Other long term (current) drug therapy: Secondary | ICD-10-CM

## 2017-05-04 DIAGNOSIS — Z992 Dependence on renal dialysis: Secondary | ICD-10-CM | POA: Diagnosis not present

## 2017-05-04 DIAGNOSIS — R410 Disorientation, unspecified: Secondary | ICD-10-CM | POA: Diagnosis not present

## 2017-05-04 DIAGNOSIS — F19921 Other psychoactive substance use, unspecified with intoxication with delirium: Secondary | ICD-10-CM

## 2017-05-04 LAB — COMPREHENSIVE METABOLIC PANEL
ALT: 20 U/L (ref 14–54)
ANION GAP: 15 (ref 5–15)
AST: 19 U/L (ref 15–41)
Albumin: 3.9 g/dL (ref 3.5–5.0)
Alkaline Phosphatase: 60 U/L (ref 38–126)
BILIRUBIN TOTAL: 0.8 mg/dL (ref 0.3–1.2)
BUN: 23 mg/dL — AB (ref 6–20)
CO2: 27 mmol/L (ref 22–32)
Calcium: 9.5 mg/dL (ref 8.9–10.3)
Chloride: 100 mmol/L — ABNORMAL LOW (ref 101–111)
Creatinine, Ser: 6.58 mg/dL — ABNORMAL HIGH (ref 0.44–1.00)
GFR, EST AFRICAN AMERICAN: 6 mL/min — AB (ref >60)
GFR, EST NON AFRICAN AMERICAN: 5 mL/min — AB (ref >60)
Glucose, Bld: 136 mg/dL — ABNORMAL HIGH (ref 65–99)
POTASSIUM: 4.4 mmol/L (ref 3.5–5.1)
Sodium: 142 mmol/L (ref 135–145)
TOTAL PROTEIN: 6.9 g/dL (ref 6.5–8.1)

## 2017-05-04 LAB — CBC WITH DIFFERENTIAL/PLATELET
BASOS ABS: 0 10*3/uL (ref 0.0–0.1)
Basophils Relative: 0 %
EOS PCT: 1 %
Eosinophils Absolute: 0.1 10*3/uL (ref 0.0–0.7)
HCT: 36.3 % (ref 36.0–46.0)
Hemoglobin: 11.5 g/dL — ABNORMAL LOW (ref 12.0–15.0)
LYMPHS PCT: 15 %
Lymphs Abs: 1.2 10*3/uL (ref 0.7–4.0)
MCH: 28.8 pg (ref 26.0–34.0)
MCHC: 31.7 g/dL (ref 30.0–36.0)
MCV: 91 fL (ref 78.0–100.0)
Monocytes Absolute: 0.8 10*3/uL (ref 0.1–1.0)
Monocytes Relative: 10 %
NEUTROS PCT: 74 %
Neutro Abs: 5.9 10*3/uL (ref 1.7–7.7)
PLATELETS: 235 10*3/uL (ref 150–400)
RBC: 3.99 MIL/uL (ref 3.87–5.11)
RDW: 16 % — ABNORMAL HIGH (ref 11.5–15.5)
WBC: 7.9 10*3/uL (ref 4.0–10.5)

## 2017-05-04 LAB — URINALYSIS, ROUTINE W REFLEX MICROSCOPIC
Bilirubin Urine: NEGATIVE
Glucose, UA: NEGATIVE mg/dL
Ketones, ur: NEGATIVE mg/dL
Nitrite: NEGATIVE
Protein, ur: >=300 mg/dL — AB
Specific Gravity, Urine: 1.015 (ref 1.005–1.030)
pH: 6 (ref 5.0–8.0)

## 2017-05-04 LAB — MRSA PCR SCREENING: MRSA BY PCR: NEGATIVE

## 2017-05-04 LAB — TSH: TSH: 5.081 u[IU]/mL — ABNORMAL HIGH (ref 0.350–4.500)

## 2017-05-04 LAB — TROPONIN I: Troponin I: 0.03 ng/mL (ref <0.03)

## 2017-05-04 LAB — CBG MONITORING, ED: Glucose-Capillary: 127 mg/dL — ABNORMAL HIGH (ref 65–99)

## 2017-05-04 MED ORDER — DILTIAZEM HCL 100 MG IV SOLR
INTRAVENOUS | Status: AC
  Administered 2017-05-04: 5 mg/h via INTRAVENOUS
  Filled 2017-05-04: qty 100

## 2017-05-04 MED ORDER — HEPARIN SODIUM (PORCINE) 5000 UNIT/ML IJ SOLN
5000.0000 [IU] | Freq: Three times a day (TID) | INTRAMUSCULAR | Status: DC
  Administered 2017-05-04 – 2017-05-08 (×13): 5000 [IU] via SUBCUTANEOUS
  Filled 2017-05-04 (×13): qty 1

## 2017-05-04 MED ORDER — SEVELAMER CARBONATE 800 MG PO TABS: 800.0000 mg | ORAL_TABLET | ORAL | Status: DC | PRN

## 2017-05-04 MED ORDER — SEVELAMER CARBONATE 800 MG PO TABS
1600.0000 mg | ORAL_TABLET | Freq: Three times a day (TID) | ORAL | Status: DC
  Administered 2017-05-04 – 2017-05-06 (×4): 1600 mg via ORAL
  Filled 2017-05-04 (×4): qty 2

## 2017-05-04 MED ORDER — CINACALCET HCL 30 MG PO TABS
30.0000 mg | ORAL_TABLET | Freq: Every day | ORAL | Status: DC
  Filled 2017-05-04 (×3): qty 1

## 2017-05-04 MED ORDER — ALPRAZOLAM 0.5 MG PO TABS
0.5000 mg | ORAL_TABLET | Freq: Three times a day (TID) | ORAL | Status: DC | PRN
  Administered 2017-05-04 – 2017-05-06 (×3): 0.5 mg via ORAL
  Filled 2017-05-04 (×3): qty 1

## 2017-05-04 MED ORDER — RENA-VITE PO TABS
1.0000 | ORAL_TABLET | Freq: Every day | ORAL | Status: DC
  Administered 2017-05-04 – 2017-05-08 (×5): 1 via ORAL
  Filled 2017-05-04 (×5): qty 1

## 2017-05-04 MED ORDER — SEVELAMER CARBONATE 800 MG PO TABS: 1600.0000 mg | ORAL_TABLET | ORAL | Status: DC | PRN

## 2017-05-04 MED ORDER — CINACALCET HCL 30 MG PO TABS
30.0000 mg | ORAL_TABLET | Freq: Every day | ORAL | Status: DC
  Administered 2017-05-04 – 2017-05-08 (×5): 30 mg via ORAL
  Filled 2017-05-04 (×6): qty 1

## 2017-05-04 MED ORDER — DILTIAZEM HCL 100 MG IV SOLR
5.0000 mg/h | INTRAVENOUS | Status: DC
  Administered 2017-05-04: 5 mg/h via INTRAVENOUS
  Administered 2017-05-04: 15 mg/h via INTRAVENOUS
  Administered 2017-05-04: 5 mg/h via INTRAVENOUS
  Administered 2017-05-04: 10 mg/h via INTRAVENOUS
  Administered 2017-05-05: 15 mg/h via INTRAVENOUS
  Administered 2017-05-05: 10 mg/h via INTRAVENOUS
  Administered 2017-05-05 – 2017-05-06 (×2): 15 mg/h via INTRAVENOUS
  Administered 2017-05-06 (×2): 10 mg/h via INTRAVENOUS
  Filled 2017-05-04 (×6): qty 100

## 2017-05-04 MED ORDER — PANTOPRAZOLE SODIUM 40 MG PO TBEC
40.0000 mg | DELAYED_RELEASE_TABLET | Freq: Two times a day (BID) | ORAL | Status: DC
Start: 2017-05-04 — End: 2017-05-08
  Administered 2017-05-04 – 2017-05-08 (×9): 40 mg via ORAL
  Filled 2017-05-04 (×9): qty 1

## 2017-05-04 MED ORDER — ACETAMINOPHEN 325 MG PO TABS: 650.0000 mg | ORAL_TABLET | ORAL | Status: DC | PRN

## 2017-05-04 MED ORDER — DILTIAZEM LOAD VIA INFUSION
10.0000 mg | Freq: Once | INTRAVENOUS | Status: DC
  Filled 2017-05-04: qty 10

## 2017-05-04 NOTE — ED Notes (Signed)
Cardizem drip changed to 10 mg/hr blood pressure and heart rate within parameter for increase of 5 mg.

## 2017-05-04 NOTE — ED Notes (Signed)
Report given to [facility], pt going to ICU room 7.

## 2017-05-04 NOTE — ED Notes (Addendum)
Patient transported to CT and XR In and Out was attempted.  Patient stated that she would notify staff when she could provide urine sample.

## 2017-05-04 NOTE — ED Provider Notes (Signed)
[facility]-EMER[facility]Y DEPT Provider Note   CSN: [id number] Arrival date & time: 05/04/17  3149     History   Chief Complaint Chief Complaint  Patient presents with  . Altered Mental Status  . Weakness    HPI Patricia Ayala is a 83 y.o. female.  Patient with history of congestive heart failure, atrial fibrillation recently taken off Coumadin due to GI bleed from an ulcer, chronic kidney disease, anemia presents with confusion. For proximally the past 2 days patient has been intermittently confused and refused to go to dialysis for which he normally goes Tuesday Thursday Saturday. Family does report she has hydrocodone to take when necessary however unsure how often she is actually taking it. No fever or infectious symptoms however patient does have mild shortness of breath. Patient currently is not on Coumadin due to the GI bleed history. Patient was talking about her daughter who is no longer live this morning. Symptoms and signs intermittent. No recent witnessed head injury. No other new medications known. She did have a fall proximal me to see go for which she's been complaining about neck pain since and per family report had an x-ray.      Past Medical History:  Diagnosis Date  . Anemia 02/11/2012  . Anxiety   . Atrial fibrillation (HCC)   . CHF (congestive heart failure) (HCC)   . Chronic kidney disease    not on dialysis yet, Tues, thurs, sat  . GERD (gastroesophageal reflux disease)   . H/O metabolic acidosis   . Hyperlipidemia   . Hyperparathyroidism, secondary (HCC)   . Hypertension    Dr. Regino Schultze, [place], [place]  . Oxygen dependent   . Vitamin D deficiency     Patient Active Problem List   Diagnosis Date Noted  . Helicobacter pylori ab+   . Chronic gastric ulcer with hemorrhage   . Acute post-traumatic headache, not intractable   . Acute blood loss anemia   . Chronic anticoagulation   . Encounter for therapeutic drug monitoring 12/03/2016  . Generalized anxiety  disorder 11/12/2016  . Atrial fibrillation with RVR (HCC) 11/01/2016  . Acute respiratory failure (HCC) 11/01/2016  . URI (upper respiratory infection) 10/30/2016  . ESRD (end stage renal disease) (HCC) 10/29/2016  . Benign essential HTN 10/29/2016  . HLD (hyperlipidemia) 10/29/2016  . Acute on chronic diastolic CHF (congestive heart failure) (HCC) 10/29/2016  . Pulmonary edema 09/26/2016  . Mechanical complication of other vascular device, implant, and graft 08/07/2013  . Anemia 02/11/2012    Past Surgical History:  Procedure Laterality Date  . ABDOMINAL HYSTERECTOMY    . AV FISTULA PLACEMENT  04/01/2012   Procedure: ARTERIOVENOUS (AV) FISTULA CREATION;  Surgeon: Sherren Kerns, MD;  Location: [facility] OR;  Service: Vascular;  Laterality: Left;  . CHOLECYSTECTOMY    . COLON SURGERY     for a blockage  . ESOPHAGOGASTRODUODENOSCOPY N/A 02/17/2017   Procedure: ESOPHAGOGASTRODUODENOSCOPY (EGD);  Surgeon: Sherrilyn Rist, MD;  Location: [facility] ENDOSCOPY;  Service: Endoscopy;  Laterality: N/A;  Bedside case in ICU  . EYE SURGERY     bilateral cataracts, /w IOL - 2013  . FISTULOGRAM N/A 11/05/2014   Procedure: FISTULOGRAM;  Surgeon: Larina Earthly, MD;  Location: [facility] CATH LAB;  Service: Cardiovascular;  Laterality: N/A;  . INSERTION OF DIALYSIS CATHETER  04/10/2012   Procedure: INSERTION OF DIALYSIS CATHETER;  Surgeon: Pryor Ochoa, MD;  Location: [facility] OR;  Service: Vascular;  Laterality: N/A;  Insertion Diatek Catheter Right Internal Jugular  .  LIGATION OF COMPETING BRANCHES OF ARTERIOVENOUS FISTULA  11/07/2012   Procedure: LIGATION OF COMPETING BRANCHES OF ARTERIOVENOUS FISTULA;  Surgeon: Chuck Hint, MD;  Location: [facility] OR;  Service: Vascular;  Laterality: Left;  Brachio/Cephalic Fistula  . SHUNTOGRAM N/A 11/03/2012   Procedure: Betsey Amen;  Surgeon: Chuck Hint, MD;  Location: [facility] CATH LAB;  Service: Cardiovascular;  Laterality: N/A;  . SHUNTOGRAM Left 03/09/2013   Procedure:  Fistulogram;  Surgeon: Fransisco Hertz, MD;  Location: [facility] CATH LAB;  Service: Cardiovascular;  Laterality: Left;  . SHUNTOGRAM Left 11/23/2013   Procedure: FISTULOGRAM;  Surgeon: Fransisco Hertz, MD;  Location: [facility] CATH LAB;  Service: Cardiovascular;  Laterality: Left;    OB History    No data available       Home Medications    Prior to Admission medications   Medication Sig Start Date End Date Taking? Authorizing Provider  acetaminophen (TYLENOL) 325 MG tablet Take 650 mg by mouth every 4 (four) hours as needed.   Yes [provider]  ALPRAZolam Prudy Feeler) 0.5 MG tablet Take 1 tablet by mouth 3 (three) times daily as needed. 04/02/17  Yes [provider]  cinacalcet (SENSIPAR) 30 MG tablet Take 30 mg by mouth daily.   Yes [provider]  diltiazem (CARDIZEM CD) 120 MG 24 hr capsule TAKE 2 CAPSULES IN THE MORNING AND 1 CAPSULE AT NIGHT. 04/26/17  Yes Laqueta Linden, MD  HYDROcodone-acetaminophen (NORCO/VICODIN) 5-325 MG tablet Take 1 tablet by mouth every 12 (twelve) hours as needed for moderate pain.    Yes [provider]  metoprolol tartrate (LOPRESSOR) 25 MG tablet Take 1 tablet (25 mg total) by mouth 2 (two) times daily. 04/26/17  Yes Laqueta Linden, MD  multivitamin (RENA-VIT) TABS tablet Take 1 tablet by mouth daily.   Yes [provider]  promethazine (PHENERGAN) 25 MG tablet Take 25 mg by mouth every 6 (six) hours as needed for nausea or vomiting.   Yes [provider]  sevelamer carbonate (RENVELA) 800 MG tablet Take 1,600 mg by mouth 3 (three) times daily with meals. Take 800mg  with snacks daily.    Yes [provider]  zolpidem (AMBIEN) 10 MG tablet Take 10 mg by mouth at bedtime as needed for sleep.   Yes [provider]  Biotin 1 MG CAPS Take 1 mg by mouth daily.    [provider]  ferrous sulfate 325 (65 FE) MG tablet Take 325 mg by mouth daily with breakfast.    [provider]    pantoprazole (PROTONIX) 40 MG tablet Take 1 tablet (40 mg total) by mouth 2 (two) times daily. 02/22/17   Rhetta Mura, MD  pentoxifylline (TRENTAL) 400 MG CR tablet Take 400 mg by mouth 3 (three) times daily with meals.    [provider]    Family History Family History  Problem Relation Age of Onset  . Stroke Mother   . Cancer Father   . Heart attack Father     Social History Social History  Substance Use Topics  . Smoking status: Never Smoker  . Smokeless tobacco: Never Used  . Alcohol use No     Allergies   Patient has no known allergies.   Review of Systems Review of Systems  Unable to perform ROS: Mental status change     Physical Exam Updated Vital Signs BP (!) 146/101 (BP Location: Right Arm)   Pulse (!) 158   Temp 99 F (37.2 C) (Oral)  Resp (!) 24   Ht 5' (1.524 m)   Wt 72.6 kg (160 lb)   SpO2 100%   BMI 31.25 kg/m   Physical Exam  Constitutional: She is oriented to person, place, and time. She appears well-developed and well-nourished.  HENT:  Head: Normocephalic and atraumatic.  Eyes: Conjunctivae are normal. Right eye exhibits no discharge. Left eye exhibits no discharge.  Neck: Normal range of motion. Neck supple. No tracheal deviation present.  Cardiovascular: Intact distal pulses.  An irregularly irregular rhythm present. Tachycardia present.   Pulmonary/Chest: Effort normal and breath sounds normal.  Abdominal: Soft. She exhibits no distension. There is no tenderness. There is no guarding.  Musculoskeletal: She exhibits no edema.  Neurological: She is alert and oriented to person, place, and time.  Patient has equal strength in upper lower extremities bilateral no obvious drift. Sensation intact palpation bilateral. Finger-nose intact. X Acoma muscle function intact pupils equal bilateral. Neck supple.  Skin: Skin is warm. No rash noted.  Psychiatric: She has a normal mood and affect.  Nursing note and vitals  reviewed.    ED Treatments / Results  Labs (all labs ordered are listed, but only abnormal results are displayed) Labs Reviewed  CBC WITH DIFFERENTIAL/PLATELET - Abnormal; Notable for the following:       Result Value   Hemoglobin 11.5 (*)    RDW 16.0 (*)    All other components within normal limits  CBG MONITORING, ED - Abnormal; Notable for the following:    Glucose-Capillary 127 (*)    All other components within normal limits  COMPREHENSIVE METABOLIC PANEL  URINALYSIS, ROUTINE W REFLEX MICROSCOPIC  TROPONIN I    EKG  EKG Interpretation  Date/Time:  Saturday May 04 2017 06:33:08 EDT Ventricular Rate:  157 PR Interval:    QRS Duration: 100 QT Interval:  311 QTC Calculation: 503 R Axis:   -39 Text Interpretation:  Atrial fibrillation with rapid V-rate Left axis deviation Low voltage, extremity and precordial leads Abnormal R-wave progression, late transition Nonspecific T abnormalities, lateral leads Baseline wander in lead(s) II V3 Since last tracing rate faster 17 Feb 2017 Confirmed by Devoria Albe ([id number]) on 05/04/2017 6:47:35 AM       Radiology No results found.  Procedures Procedures (including critical care time) CRITICAL CARE Performed by: Enid Skeens   Total critical care time: 35 minutes  Critical care time was exclusive of separately billable procedures and treating other patients.  Critical care was necessary to treat or prevent imminent or life-threatening deterioration.  Critical care was time spent personally by me on the following activities: development of treatment plan with patient and/or surrogate as well as nursing, discussions with consultants, evaluation of patient's response to treatment, examination of patient, obtaining history from patient or surrogate, ordering and performing treatments and interventions, ordering and review of laboratory studies, ordering and review of radiographic studies, pulse oximetry and re-evaluation of patient's  condition.  Medications Ordered in ED Medications  diltiazem (CARDIZEM) 1 mg/mL load via infusion 10 mg (not administered)    And  diltiazem (CARDIZEM) 100 mg in dextrose 5 % 100 mL (1 mg/mL) infusion (10 mg/hr Intravenous Bolus from Bag 05/04/17 0720)     Initial Impression / Assessment and Plan / ED Course  I have reviewed the triage vital signs and the nursing notes.  Pertinent labs & imaging results that were available during my care of the patient were reviewed by me and considered in my medical decision making (see chart for  details).   Patient presents to emergency department with delirium discussed differential diagnosis with family including medication use, stroke, urine infection, other. Blood work, CT scan and urinalysis pending. Cardizem bolus and drip ordered to be titrated for atrial fib ablation RVR. Patient does have higher chads Vascor however cannot have Coumadin due to GI bleed.  CHA2DS2/VAS Stroke Risk Points      5 >= 2 Points: High Risk  1 - 1.99 Points: Medium Risk  0 Points: Low Risk    The patient's score has not changed in the past year.:  No Change     Details    Note: External data might be a factor in metrics not marked with    Points Metrics   This score determines the patient's risk of having a stroke if the  patient has atrial fibrillation.       1 Has Congestive Heart Failure:  Yes   0 Has Vascular Disease:  No   1 Has Hypertension:  Yes   2 Age:  57   0 Has Diabetes:  No   0 Had Stroke:  No Had TIA:  No Had thromboembolism:  No   1 Female:  Yes      The patients results and plan were reviewed and discussed.   Any x-rays performed were independently reviewed by myself.   Differential diagnosis were considered with the presenting HPI.  Medications  diltiazem (CARDIZEM) 1 mg/mL load via infusion 10 mg (not administered)    And  diltiazem (CARDIZEM) 100 mg in dextrose 5 % 100 mL (1 mg/mL) infusion (10 mg/hr Intravenous Bolus from Bag 05/04/17  0720)    Vitals:   05/04/17 1610 05/04/17 0635  BP:  (!) 146/101  Pulse:  (!) 158  Resp:  (!) 24  Temp:  99 F (37.2 C)  TempSrc:  Oral  SpO2:  100%  Weight: 72.6 kg (160 lb)   Height: 5' (1.524 m)     Final diagnoses:  Atrial fibrillation with RVR (HCC)  Medication-induced delirium, acute, mixed level of activity (HCC)    Admission/ observation were discussed with the admitting physician, patient and/or family and they are comfortable with the plan.       Final Clinical Impressions(s) / ED Diagnoses   Final diagnoses:  None    New Prescriptions New Prescriptions   No medications on file     Blane Ohara, MD 05/05/17 [phone]

## 2017-05-04 NOTE — ED Provider Notes (Addendum)
MSE was initiated and I personally evaluated the patient and placed orders (if any) at  06:35 AM on May 04, 2017.  The patient appears stable so that the remainder of the MSE may be completed by another provider.  Patient's daughter and granddaughter state patient has been confused since July 5. They states she's talking about her daughter who is dead and this morning she did not recognize the daughter that she lives with. Patient denies chest pain but states she feels short of breath and she feels like her heart races when she walks. She also states she feels weak. This morning she refused to go to her dialysis which she normally has on Tuesday, Thursday, and Saturdays (today) at [place].  Patient is alert and cooperative. She seems a little anxious. On the monitor she is noted to be in atrial fibrillation with heart rate 130 to 170. On heart exam her heart exam is tachycardic and irregularly irregular. She does appear to be short of breath at times. On neuro exam her grips are equal and she is able to lift both legs off the stretcher. Her face is symmetrical. She is able to speak clearly. Patient knows she is in the emergency department, she knows what hospital she is in, and when asked what city the hospitals and she states "[place]".  Review of patient's chart shows she has a history of atrial fibrillation, however she was taken off anticoagulation in April due to a major GI bleed necessitating prolonged endoscopy and clipping and intubation. She was treated for H. Pylori.    EKG Interpretation  Date/Time:  Saturday May 04 2017 06:33:08 EDT Ventricular Rate:  157 PR Interval:    QRS Duration: 100 QT Interval:  311 QTC Calculation: 503 R Axis:   -39 Text Interpretation:  Atrial fibrillation with rapid V-rate Left axis deviation Low voltage, extremity and precordial leads Abnormal R-wave progression, late transition Nonspecific T abnormalities, lateral leads Baseline wander in lead(s) II V3  Since last tracing rate faster 17 Feb 2017 Confirmed by Devoria Albe ([id number]) on 05/04/2017 6:47:35 AM       Initial laboratory testing was ordered, portable chest x-ray and head CT was ordered to evaluate her complaints of shortness of breath and for the confusion. She was started on a Cardizem bolus and drip for her atrial fibrillation with fast ventricular response. Patient came in as a possible stroke, however I do not see any focal neurological findings at this time. And her symptoms started 48 hours ago so she is not a TPA candidate, also she had the recent major GI bleed.  Devoria Albe, MD, Concha Pyo, MD 05/04/17 3825    Devoria Albe, MD 05/04/17 [phone]

## 2017-05-04 NOTE — ED Notes (Signed)
Pt does know name, dob, president, but thinks that it is 2012-2013.  Pt states that things are in her mind, but it takes so much for it to come to her and come out.  Pt told me an entire event of people coming to visit in particular a daughter who passed away in Mar 19, 2010.  Pts family states that she was fine and this began occurring on Thursday.

## 2017-05-04 NOTE — Consult Note (Signed)
Reason for Consult: End-stage renal disease Referring Physician: Dr. Gerhard Ayala is an 83 y.o. female.  HPI: She is a patient with history of hypertension, atrial fibrillation, history of DVT and end-stage renal disease presently was brought by family is because of increased confusion and declining to go to dialysis. Patient denies any nausea or vomiting. She denies also any difficulty breathing. When she was evaluated in emergency room was found to have atrial fibrillation was uncontrolled heart rate hence admitted to the hospital. Presently patient offers no complaint except complains of headache, neck pain which is chronic and long-standing. Patient also complains of leg pain.  Past Medical History:  Diagnosis Date  . Anemia 02/11/2012  . Anxiety   . Atrial fibrillation ([place])   . CHF (congestive heart failure) ([place])   . Chronic kidney disease    not on dialysis yet, Tues, thurs, sat  . GERD (gastroesophageal reflux disease)   . H/O metabolic acidosis   . Hyperlipidemia   . Hyperparathyroidism, secondary ([place])   . Hypertension    Dr. Orson Ape, Patricia Ayala, Patricia Ayala  . Oxygen dependent   . Vitamin D deficiency     Past Surgical History:  Procedure Laterality Date  . ABDOMINAL HYSTERECTOMY    . AV FISTULA PLACEMENT  04/01/2012   Procedure: ARTERIOVENOUS (AV) FISTULA CREATION;  Surgeon: Elam Dutch, MD;  Location: [facility];  Service: Vascular;  Laterality: Left;  . CHOLECYSTECTOMY    . COLON SURGERY     for a blockage  . ESOPHAGOGASTRODUODENOSCOPY N/A 02/17/2017   Procedure: ESOPHAGOGASTRODUODENOSCOPY (EGD);  Surgeon: Doran Stabler, MD;  Location: [facility] ENDOSCOPY;  Service: Endoscopy;  Laterality: N/A;  Bedside case in ICU  . EYE SURGERY     bilateral cataracts, /w IOL - 2013  . FISTULOGRAM N/A 11/05/2014   Procedure: FISTULOGRAM;  Surgeon: Rosetta Posner, MD;  Location: [facility] CATH LAB;  Service: Cardiovascular;  Laterality: N/A;  . INSERTION OF DIALYSIS CATHETER  04/10/2012   Procedure:  INSERTION OF DIALYSIS CATHETER;  Surgeon: Mal Misty, MD;  Location: [facility];  Service: Vascular;  Laterality: N/A;  Insertion Diatek Catheter Right Internal Jugular  . LIGATION OF COMPETING BRANCHES OF ARTERIOVENOUS FISTULA  11/07/2012   Procedure: LIGATION OF COMPETING BRANCHES OF ARTERIOVENOUS FISTULA;  Surgeon: Angelia Mould, MD;  Location: [facility];  Service: Vascular;  Laterality: Left;  Brachio/Cephalic Fistula  . SHUNTOGRAM N/A 11/03/2012   Procedure: Earney Mallet;  Surgeon: Angelia Mould, MD;  Location: [facility] CATH LAB;  Service: Cardiovascular;  Laterality: N/A;  . SHUNTOGRAM Left 03/09/2013   Procedure: Fistulogram;  Surgeon: Conrad [place], MD;  Location: [facility] CATH LAB;  Service: Cardiovascular;  Laterality: Left;  . SHUNTOGRAM Left 11/23/2013   Procedure: FISTULOGRAM;  Surgeon: Conrad Callender, MD;  Location: [facility] CATH LAB;  Service: Cardiovascular;  Laterality: Left;    Family History  Problem Relation Age of Onset  . Stroke Mother   . Cancer Father   . Heart attack Father     Social History:  reports that she has never smoked. She has never used smokeless tobacco. She reports that she does not drink alcohol or use drugs.  Allergies: No Known Allergies  Medications: I have reviewed the patient's current medications.  Results for orders placed or performed during the hospital encounter of 05/04/17 (from the past 48 hour(s))  CBG monitoring, ED     Status: Abnormal   Collection Time: 05/04/17  6:36 AM  Result Value Ref Range  Glucose-Capillary 127 (H) 65 - 99 mg/dL  Comprehensive metabolic panel     Status: Abnormal   Collection Time: 05/04/17  6:50 AM  Result Value Ref Range   Sodium 142 135 - 145 mmol/L   Potassium 4.4 3.5 - 5.1 mmol/L   Chloride 100 (L) 101 - 111 mmol/L   CO2 27 22 - 32 mmol/L   Glucose, Bld 136 (H) 65 - 99 mg/dL   BUN 23 (H) 6 - 20 mg/dL   Creatinine, Ser 6.58 (H) 0.44 - 1.00 mg/dL   Calcium 9.5 8.9 - 10.3 mg/dL   Total Protein 6.9 6.5 - 8.1 g/dL    Albumin 3.9 3.5 - 5.0 g/dL   AST 19 15 - 41 U/L   ALT 20 14 - 54 U/L   Alkaline Phosphatase 60 38 - 126 U/L   Total Bilirubin 0.8 0.3 - 1.2 mg/dL   GFR calc non Af Amer 5 (L) >60 mL/min   GFR calc Af Amer 6 (L) >60 mL/min    Comment: (NOTE) The eGFR has been calculated using the CKD EPI equation. This calculation has not been validated in all clinical situations. eGFR's persistently <60 mL/min signify possible Chronic Kidney Disease.    Anion gap 15 5 - 15  CBC with Differential     Status: Abnormal   Collection Time: 05/04/17  6:50 AM  Result Value Ref Range   WBC 7.9 4.0 - 10.5 K/uL   RBC 3.99 3.87 - 5.11 MIL/uL   Hemoglobin 11.5 (L) 12.0 - 15.0 g/dL   HCT 36.3 36.0 - 46.0 %   MCV 91.0 78.0 - 100.0 fL   MCH 28.8 26.0 - 34.0 pg   MCHC 31.7 30.0 - 36.0 g/dL   RDW 16.0 (H) 11.5 - 15.5 %   Platelets 235 150 - 400 K/uL   Neutrophils Relative % 74 %   Neutro Abs 5.9 1.7 - 7.7 K/uL   Lymphocytes Relative 15 %   Lymphs Abs 1.2 0.7 - 4.0 K/uL   Monocytes Relative 10 %   Monocytes Absolute 0.8 0.1 - 1.0 K/uL   Eosinophils Relative 1 %   Eosinophils Absolute 0.1 0.0 - 0.7 K/uL   Basophils Relative 0 %   Basophils Absolute 0.0 0.0 - 0.1 K/uL  Troponin I     Status: Abnormal   Collection Time: 05/04/17  6:50 AM  Result Value Ref Range   Troponin I 0.03 (HH) <0.03 ng/mL    Comment: CRITICAL RESULT CALLED TO, READ BACK BY AND VERIFIED WITH: SWANSON,J AT 0730 ON 05/04/2017 BY MOSLEY,J   Urinalysis, Routine w reflex microscopic     Status: Abnormal   Collection Time: 05/04/17 10:53 AM  Result Value Ref Range   Color, Urine AMBER (A) YELLOW    Comment: BIOCHEMICALS MAY BE AFFECTED BY COLOR   APPearance CLOUDY (A) CLEAR   Specific Gravity, Urine 1.015 1.005 - 1.030   pH 6.0 5.0 - 8.0   Glucose, UA NEGATIVE NEGATIVE mg/dL   Hgb urine dipstick MODERATE (A) NEGATIVE   Bilirubin Urine NEGATIVE NEGATIVE   Ketones, ur NEGATIVE NEGATIVE mg/dL   Protein, ur >=300 (A) NEGATIVE mg/dL    Nitrite NEGATIVE NEGATIVE   Leukocytes, UA LARGE (A) NEGATIVE   RBC / HPF 0-5 0 - 5 RBC/hpf   WBC, UA TOO NUMEROUS TO COUNT 0 - 5 WBC/hpf   Bacteria, UA RARE (A) NONE SEEN   Squamous Epithelial / LPF 6-30 (A) NONE SEEN   Mucous PRESENT  Dg Chest 1 View  Result Date: 05/04/2017 CLINICAL DATA:  Patient with shortness of breath and confusion. EXAM: CHEST 1 VIEW COMPARISON:  Chest radiograph 02/18/2017 FINDINGS: Monitoring leads overlie the patient. Stable cardiomegaly. Calcification of the thoracic aorta. Minimal heterogeneous opacities left lung base. No pleural effusion or pneumothorax. IMPRESSION: Cardiomegaly. Heterogeneous opacities left lung base favored to represent atelectasis. Electronically Signed   By: Lovey Newcomer M.D.   On: 05/04/2017 07:49   Ct Head Wo Contrast  Result Date: 05/04/2017 CLINICAL DATA:  Patient with confusion for multiple days. Weakness and neck pain. No known injury. EXAM: CT HEAD WITHOUT CONTRAST CT CERVICAL SPINE WITHOUT CONTRAST TECHNIQUE: Multidetector CT imaging of the head and cervical spine was performed following the standard protocol without intravenous contrast. Multiplanar CT image reconstructions of the cervical spine were also generated. COMPARISON:  CT brain 02/16/2017. FINDINGS: CT HEAD FINDINGS Brain: Ventricles and sulci are prominent compatible with atrophy. Periventricular and subcortical white matter hypodensity compatible with chronic microvascular ischemic changes. Old left greater than right colonic infarcts. Old left occipital lobe infarct. No evidence for acute cortically based infarct, intracranial hemorrhage, mass lesion or mass-effect. Vascular: Internal carotid arterial vascular calcifications. Skull: Intact. Sinuses/Orbits: Paranasal sinuses are well aerated. Mastoid air cells are unremarkable. Other: None. CT CERVICAL SPINE FINDINGS Alignment: Normal. Skull base and vertebrae: Intact. Mild degenerative changes about the dens. Soft tissues and  spinal canal: No prevertebral fluid or swelling. No visible canal hematoma. Disc levels: Multilevel degenerative disc disease throughout the cervical spine including the C3-4, C4-5, C5-6 and C6-7 levels with anterior endplate osteophytosis and disc osteophyte complexes. Multilevel facet degenerative changes most pronounced on the right. No evidence for acute cervical spine fracture. Upper chest: Aortic vascular calcifications. Small bilateral pleural effusions. Other: 2 cm calcified nodule within the right thyroid lobe (image 72; series 4). IMPRESSION: No acute intracranial process. Chronic microvascular ischemic changes. 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 No acute cervical spine fracture. Multilevel degenerative disc disease. Right thyroid nodule. If not previously performed, recommend further evaluation with thyroid ultrasound. Aortic vascular calcifications. Electronically Signed   By: Lovey Newcomer M.D.   On: 05/04/2017 08:09   Ct Cervical Spine Wo Contrast  Result Date: 05/04/2017 CLINICAL DATA:  Patient with confusion for multiple days. Weakness and neck pain. No known injury. EXAM: CT HEAD WITHOUT CONTRAST CT CERVICAL SPINE WITHOUT CONTRAST TECHNIQUE: Multidetector CT imaging of the head and cervical spine was performed following the standard protocol without intravenous contrast. Multiplanar CT image reconstructions of the cervical spine were also generated. COMPARISON:  CT brain 02/16/2017. FINDINGS: CT HEAD FINDINGS Brain: Ventricles and sulci are prominent compatible with atrophy. Periventricular and subcortical white matter hypodensity compatible with chronic microvascular ischemic changes. Old left greater than right colonic infarcts. Old left occipital lobe infarct. No evidence for acute cortically based infarct, intracranial hemorrhage, mass lesion or mass-effect. Vascular: Internal carotid arterial vascular calcifications. Skull: Intact. Sinuses/Orbits: Paranasal sinuses are well  aerated. Mastoid air cells are unremarkable. Other: None. CT CERVICAL SPINE FINDINGS Alignment: Normal. Skull base and vertebrae: Intact. Mild degenerative changes about the dens. Soft tissues and spinal canal: No prevertebral fluid or swelling. No visible canal hematoma. Disc levels: Multilevel degenerative disc disease throughout the cervical spine including the C3-4, C4-5, C5-6 and C6-7 levels with anterior endplate osteophytosis and disc osteophyte complexes. Multilevel facet degenerative changes most pronounced on the right. No evidence for acute cervical spine fracture. Upper chest: Aortic vascular calcifications. Small bilateral pleural effusions. Other: 2 cm  calcified nodule within the right thyroid lobe (image 72; series 4). IMPRESSION: No acute intracranial process. Chronic microvascular ischemic changes. 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 No acute cervical spine fracture. Multilevel degenerative disc disease. Right thyroid nodule. If not previously performed, recommend further evaluation with thyroid ultrasound. Aortic vascular calcifications. Electronically Signed   By: Lovey Newcomer M.D.   On: 05/04/2017 08:09    Review of Systems  Eyes: Negative for blurred vision and double vision.  Respiratory: Negative for shortness of breath.   Cardiovascular: Positive for leg swelling. Negative for chest pain.  Gastrointestinal: Negative for heartburn and nausea.  Musculoskeletal: Positive for joint pain.  Neurological: Negative for dizziness and headaches.   Blood pressure 133/79, pulse (!) 195, temperature 98.4 F (36.9 C), temperature source Oral, resp. rate (!) 25, height 5' 4" (1.626 m), weight 68.7 kg (151 lb 7.3 oz), SpO2 97 %. Physical Exam  Constitutional: No distress.  Eyes: Left eye exhibits no discharge.  Neck: No JVD present.  Cardiovascular:  Irregular rate and rhythm  Respiratory: She has no wheezes. She has no rales.  GI: She exhibits no distension. There is no  tenderness. There is no rebound.  Musculoskeletal: She exhibits tenderness. She exhibits no edema.    Assessment/Plan: Problem #1 confusion: Presently patient is alert and answering questions. Possibly related to her medications. Problem #2 end-stage renal disease: She is status post hemodialysis on Thursday. Presently she doesn't have any nausea or vomiting. Her potassium is normal. Patient is due for dialysis today however she feels tired and she doesn't want dialysis for today. Problem #3 fluid management: No significant sign of fluid overload Problem #4 a trial fibrillation: She is on Cardizem drip and her heart rate is controlled Problem #5 anemia: Her hemoglobin is was no target goal Problem #6 history of DVT Problem #7 bone and mineral disorder: Her calcium is in range Plan: We'll make arrangements for patient to get dialysis tomorrow We'll check her renal panel and CBC in the morning.  , S 05/04/2017, 1:28 PM

## 2017-05-04 NOTE — ED Notes (Signed)
CRITICAL VALUE ALERT  Critical Value:  Troponin 0.03  Date & Time Notied:  05/04/2017 0731  Provider Notified: Dr. Jodi Mourning  Orders Received/Actions taken:  No orders taken at this time

## 2017-05-04 NOTE — ED Notes (Signed)
Cardizem drip changed to 15 mg/hr.  Blood pressure and heart rate within parameter for an increase of 5 mg/hr.

## 2017-05-04 NOTE — ED Notes (Signed)
Attempted to call report

## 2017-05-04 NOTE — ED Triage Notes (Signed)
Pt states pt has been confused since yesterday. Pt last know well Thursday. Pt is a dialysis pt. ([place], Thu, Sat)

## 2017-05-04 NOTE — H&P (Signed)
History and Physical    Patricia Ayala ZOX:[id number] DOB: 1928-11-10 DOA: 05/04/2017  PCP: [name], MD  Patient coming from: Home.    Chief Complaint:  Altered mental status.   HPI: Patricia Ayala is an 83 y.o. female with hx of ESRD on HD (T Th S), hx of anxiety, CHF, HTN, afib previously on anticoagulation, stopped 4/18 due to GI bleed, GERD, lives at home with her husband,brought to the ER as she has not been feeling well for several days.  She also was noted to have intermittent confusion.  She was given more Narcotics prior to this, and had been taken up to 4 tablets per day.  She has had chronic neck pain.  There has been no abdominal pain, nausea, vomiting, Vx problems, or SOB.  She has no focal weakness.   Evaluation in the ER included an EKG which showed afib with RVR rate of 160, normal K, Cr of 6.5, no leukocytosis, and Hb of 11.5 g per dL.  Her head CT showed old strokes, with no acute process, and her cervical CT showed DJD changes.  She was started on IV Cardiazem, and hospitalist was asked to admit her for AMS, felt to be due to narcotic use, along with RVR with chronic afib.   ED Course:  See above.  Rewiew of Systems:  Constitutional: Negative for malaise, fever and chills. No significant weight loss or weight gain Eyes: Negative for eye pain, redness and discharge, diplopia, visual changes, or flashes of light. ENMT: Negative for ear pain, hoarseness, nasal congestion, sinus pressure and sore throat. No headaches; tinnitus, drooling, or problem swallowing. Cardiovascular: Negative for chest pain, palpitations, diaphoresis, dyspnea and peripheral edema. ; No orthopnea, PND Respiratory: Negative for cough, hemoptysis, wheezing and stridor. No pleuritic chestpain. Gastrointestinal: Negative for diarrhea, constipation,  melena, blood in stool, hematemesis, jaundice and rectal bleeding.    Genitourinary: Negative for frequency, dysuria, incontinence,flank pain and  hematuria; Musculoskeletal: Negative for back pain and Negative for swelling and trauma.;  Skin: . Negative for pruritus, rash, abrasions, bruising and skin lesion.; ulcerations Neuro: Negative for headache, lightheadedness and neck stiffness. Negative for extremity weakness, burning feet, involuntary movement, seizure and syncope.  Psych: negative for anxiety, depression, insomnia, tearfulness, panic attacks, hallucinations, paranoia, suicidal or homicidal ideation   Past Medical History:  Diagnosis Date  . Anemia 02/11/2012  . Anxiety   . Atrial fibrillation (HCC)   . CHF (congestive heart failure) (HCC)   . Chronic kidney disease    not on dialysis yet, Tues, thurs, sat  . GERD (gastroesophageal reflux disease)   . H/O metabolic acidosis   . Hyperlipidemia   . Hyperparathyroidism, secondary (HCC)   . Hypertension    Dr. Regino Schultze, [place], [place]  . Oxygen dependent   . Vitamin D deficiency     Past Surgical History:  Procedure Laterality Date  . ABDOMINAL HYSTERECTOMY    . AV FISTULA PLACEMENT  04/01/2012   Procedure: ARTERIOVENOUS (AV) FISTULA CREATION;  Surgeon: Sherren Kerns, MD;  Location: [facility] OR;  Service: Vascular;  Laterality: Left;  . CHOLECYSTECTOMY    . COLON SURGERY     for a blockage  . ESOPHAGOGASTRODUODENOSCOPY N/A 02/17/2017   Procedure: ESOPHAGOGASTRODUODENOSCOPY (EGD);  Surgeon: Sherrilyn Rist, MD;  Location: [facility] ENDOSCOPY;  Service: Endoscopy;  Laterality: N/A;  Bedside case in ICU  . EYE SURGERY     bilateral cataracts, /w IOL - 2013  . FISTULOGRAM N/A 11/05/2014   Procedure: FISTULOGRAM;  Surgeon: Larina Earthly, MD;  Location: [facility] CATH LAB;  Service: Cardiovascular;  Laterality: N/A;  . INSERTION OF DIALYSIS CATHETER  04/10/2012   Procedure: INSERTION OF DIALYSIS CATHETER;  Surgeon: Pryor Ochoa, MD;  Location: [facility] OR;  Service: Vascular;  Laterality: N/A;  Insertion Diatek Catheter Right Internal Jugular  . LIGATION OF COMPETING BRANCHES OF ARTERIOVENOUS  FISTULA  11/07/2012   Procedure: LIGATION OF COMPETING BRANCHES OF ARTERIOVENOUS FISTULA;  Surgeon: Chuck Hint, MD;  Location: [facility] OR;  Service: Vascular;  Laterality: Left;  Brachio/Cephalic Fistula  . SHUNTOGRAM N/A 11/03/2012   Procedure: Betsey Amen;  Surgeon: Chuck Hint, MD;  Location: [facility] CATH LAB;  Service: Cardiovascular;  Laterality: N/A;  . SHUNTOGRAM Left 03/09/2013   Procedure: Fistulogram;  Surgeon: Fransisco Hertz, MD;  Location: [facility] CATH LAB;  Service: Cardiovascular;  Laterality: Left;  . SHUNTOGRAM Left 11/23/2013   Procedure: FISTULOGRAM;  Surgeon: Fransisco Hertz, MD;  Location: [facility] CATH LAB;  Service: Cardiovascular;  Laterality: Left;     reports that she has never smoked. She has never used smokeless tobacco. She reports that she does not drink alcohol or use drugs.  No Known Allergies  Family History  Problem Relation Age of Onset  . Stroke Mother   . Cancer Father   . Heart attack Father      Prior to Admission medications   Medication Sig Start Date End Date Taking? Authorizing Provider  acetaminophen (TYLENOL) 325 MG tablet Take 650 mg by mouth every 4 (four) hours as needed.   Yes [provider]  ALPRAZolam Prudy Feeler) 0.5 MG tablet Take 1 tablet by mouth 3 (three) times daily as needed. 04/02/17  Yes [provider]  cinacalcet (SENSIPAR) 30 MG tablet Take 30 mg by mouth daily.   Yes [provider]  diltiazem (CARDIZEM CD) 120 MG 24 hr capsule TAKE 2 CAPSULES IN THE MORNING AND 1 CAPSULE AT NIGHT. 04/26/17  Yes Laqueta Linden, MD  HYDROcodone-acetaminophen (NORCO) 10-325 MG tablet Take 1 tablet by mouth every 6 (six) hours as needed for moderate pain.    Yes [provider]  Hydrocodone-Acetaminophen 2.5-325 MG TABS Take 1 tablet by mouth 4 (four) times daily.   Yes [provider]  metoprolol tartrate (LOPRESSOR) 25 MG tablet Take 1 tablet (25 mg total) by mouth 2 (two) times daily. 04/26/17  Yes Laqueta Linden, MD  multivitamin (RENA-VIT) TABS tablet Take 1 tablet by mouth daily.   Yes [provider]  promethazine (PHENERGAN) 25 MG tablet Take 25 mg by mouth every 6 (six) hours as needed for nausea or vomiting.   Yes [provider]  sevelamer carbonate (RENVELA) 800 MG tablet Take 1,600 mg by mouth 3 (three) times daily with meals. Take 800mg  with snacks daily.    Yes [provider]  zolpidem (AMBIEN) 10 MG tablet Take 10 mg by mouth at bedtime as needed for sleep.   Yes [provider]  pantoprazole (PROTONIX) 40 MG tablet Take 1 tablet (40 mg total) by mouth 2 (two) times daily. Patient not taking: Reported on 05/04/2017 02/22/17   Rhetta Mura, MD    Physical Exam: Vitals:   05/04/17 0900 05/04/17 0915 05/04/17 0930 05/04/17 1049  BP: 123/72 129/74 133/88 137/87  Pulse: (!) 131 (!) 110 (!) 143 (!) 102  Resp: 11 13 14 16   Temp:      TempSrc:      SpO2: 97% 97% 100% 98%  Weight:  Height:          Constitutional: NAD, calm, comfortable Vitals:   05/04/17 0900 05/04/17 0915 05/04/17 0930 05/04/17 1049  BP: 123/72 129/74 133/88 137/87  Pulse: (!) 131 (!) 110 (!) 143 (!) 102  Resp: 11 13 14 16   Temp:      TempSrc:      SpO2: 97% 97% 100% 98%  Weight:      Height:       Eyes: PERRL, lids and conjunctivae normal ENMT: Mucous membranes are moist. Posterior pharynx clear of any exudate or lesions.Normal dentition.  Neck: normal, supple, no masses, no thyromegaly Respiratory: clear to auscultation bilaterally, no wheezing, no crackles. Normal respiratory effort. No accessory muscle use.  Cardiovascular: Regular rate and rhythm, no murmurs / rubs / gallops. No extremity edema. 2+ pedal pulses. No carotid bruits.  Abdomen: no tenderness, no masses palpated. No hepatosplenomegaly. Bowel sounds positive.  Musculoskeletal: no clubbing / cyanosis. No joint deformity upper and lower extremities. Good ROM, no contractures. Normal muscle  tone.  Skin: no rashes, lesions, ulcers. No induration Neurologic: CN 2-12 grossly intact. Sensation intact, DTR normal. Strength 5/5 in all 4.  Psychiatric: Normal judgment and insight. Alert and oriented x 3. Normal mood.    Labs on Admission: I have personally reviewed following labs and imaging studies CBC:  Recent Labs Lab 05/04/17 0650  WBC 7.9  NEUTROABS 5.9  HGB 11.5*  HCT 36.3  MCV 91.0  PLT 235   Basic Metabolic Panel:  Recent Labs Lab 05/04/17 0650  NA 142  K 4.4  CL 100*  CO2 27  GLUCOSE 136*  BUN 23*  CREATININE 6.58*  CALCIUM 9.5   GFR: Estimated Creatinine Clearance: 5.5 mL/min (A) (by C-G formula based on SCr of 6.58 mg/dL (H)). Liver Function Tests:  Recent Labs Lab 05/04/17 0650  AST 19  ALT 20  ALKPHOS 60  BILITOT 0.8  PROT 6.9  ALBUMIN 3.9   Cardiac Enzymes:  Recent Labs Lab 05/04/17 0650  TROPONINI 0.03*   CBG:  Recent Labs Lab 05/04/17 0636  GLUCAP 127*   Urine analysis:    Component Value Date/Time   COLORURINE YELLOW 09/24/2015 1341   APPEARANCEUR CLEAR 09/24/2015 1341   LABSPEC 1.010 09/24/2015 1341   PHURINE 8.0 09/24/2015 1341   GLUCOSEU 100 (A) 09/24/2015 1341   HGBUR MODERATE (A) 09/24/2015 1341   BILIRUBINUR NEGATIVE 09/24/2015 1341   KETONESUR NEGATIVE 09/24/2015 1341   PROTEINUR 100 (A) 09/24/2015 1341   NITRITE NEGATIVE 09/24/2015 1341   LEUKOCYTESUR NEGATIVE 09/24/2015 1341   Radiological Exams on Admission: Dg Chest 1 View  Result Date: 05/04/2017 CLINICAL DATA:  Patient with shortness of breath and confusion. EXAM: CHEST 1 VIEW COMPARISON:  Chest radiograph 02/18/2017 FINDINGS: Monitoring leads overlie the patient. Stable cardiomegaly. Calcification of the thoracic aorta. Minimal heterogeneous opacities left lung base. No pleural effusion or pneumothorax. IMPRESSION: Cardiomegaly. Heterogeneous opacities left lung base favored to represent atelectasis. Electronically Signed   By: Annia Belt M.D.   On:  05/04/2017 07:49   Ct Head Wo Contrast  Result Date: 05/04/2017 CLINICAL DATA:  Patient with confusion for multiple days. Weakness and neck pain. No known injury. EXAM: CT HEAD WITHOUT CONTRAST CT CERVICAL SPINE WITHOUT CONTRAST TECHNIQUE: Multidetector CT imaging of the head and cervical spine was performed following the standard protocol without intravenous contrast. Multiplanar CT image reconstructions of the cervical spine were also generated. COMPARISON:  CT brain 02/16/2017. FINDINGS: CT HEAD FINDINGS Brain: Ventricles and sulci are  prominent compatible with atrophy. Periventricular and subcortical white matter hypodensity compatible with chronic microvascular ischemic changes. Old left greater than right colonic infarcts. Old left occipital lobe infarct. No evidence for acute cortically based infarct, intracranial hemorrhage, mass lesion or mass-effect. Vascular: Internal carotid arterial vascular calcifications. Skull: Intact. Sinuses/Orbits: Paranasal sinuses are well aerated. Mastoid air cells are unremarkable. Other: None. CT CERVICAL SPINE FINDINGS Alignment: Normal. Skull base and vertebrae: Intact. Mild degenerative changes about the dens. Soft tissues and spinal canal: No prevertebral fluid or swelling. No visible canal hematoma. Disc levels: Multilevel degenerative disc disease throughout the cervical spine including the C3-4, C4-5, C5-6 and C6-7 levels with anterior endplate osteophytosis and disc osteophyte complexes. Multilevel facet degenerative changes most pronounced on the right. No evidence for acute cervical spine fracture. Upper chest: Aortic vascular calcifications. Small bilateral pleural effusions. Other: 2 cm calcified nodule within the right thyroid lobe (image 72; series 4). IMPRESSION: No acute intracranial process. Chronic microvascular ischemic changes. 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 No acute cervical spine fracture. Multilevel degenerative disc disease.  Right thyroid nodule. If not previously performed, recommend further evaluation with thyroid ultrasound. Aortic vascular calcifications. Electronically Signed   By: Annia Belt M.D.   On: 05/04/2017 08:09   Ct Cervical Spine Wo Contrast  Result Date: 05/04/2017 CLINICAL DATA:  Patient with confusion for multiple days. Weakness and neck pain. No known injury. EXAM: CT HEAD WITHOUT CONTRAST CT CERVICAL SPINE WITHOUT CONTRAST TECHNIQUE: Multidetector CT imaging of the head and cervical spine was performed following the standard protocol without intravenous contrast. Multiplanar CT image reconstructions of the cervical spine were also generated. COMPARISON:  CT brain 02/16/2017. FINDINGS: CT HEAD FINDINGS Brain: Ventricles and sulci are prominent compatible with atrophy. Periventricular and subcortical white matter hypodensity compatible with chronic microvascular ischemic changes. Old left greater than right colonic infarcts. Old left occipital lobe infarct. No evidence for acute cortically based infarct, intracranial hemorrhage, mass lesion or mass-effect. Vascular: Internal carotid arterial vascular calcifications. Skull: Intact. Sinuses/Orbits: Paranasal sinuses are well aerated. Mastoid air cells are unremarkable. Other: None. CT CERVICAL SPINE FINDINGS Alignment: Normal. Skull base and vertebrae: Intact. Mild degenerative changes about the dens. Soft tissues and spinal canal: No prevertebral fluid or swelling. No visible canal hematoma. Disc levels: Multilevel degenerative disc disease throughout the cervical spine including the C3-4, C4-5, C5-6 and C6-7 levels with anterior endplate osteophytosis and disc osteophyte complexes. Multilevel facet degenerative changes most pronounced on the right. No evidence for acute cervical spine fracture. Upper chest: Aortic vascular calcifications. Small bilateral pleural effusions. Other: 2 cm calcified nodule within the right thyroid lobe (image 72; series 4). IMPRESSION:  No acute intracranial process. Chronic microvascular ischemic changes. 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 No acute cervical spine fracture. Multilevel degenerative disc disease. Right thyroid nodule. If not previously performed, recommend further evaluation with thyroid ultrasound. Aortic vascular calcifications. Electronically Signed   By: Annia Belt M.D.   On: 05/04/2017 08:09    EKG: Independently reviewed.   Assessment/Plan Principal Problem:   Delirium, acute Active Problems:   ESRD (end stage renal disease) (HCC)   HLD (hyperlipidemia)   Atrial fibrillation with RVR (HCC)   Generalized anxiety disorder   Delirium    PLAN:   Delirium:  I suspect she has intermittent altered mental status from narcotic use.  She was given an increased dose of her percocet to 10mg  QID prior to her feeling this way.  Will d/c her narcotic and re  evaluate her need.  Afib with RVR:  Not a candidate for anticoagulation given major GI blled recently.  Will continue with IV cardiazem drip and admit to SDU.  Check TSH.   ESRD on HD TTS:  Have consulted Dr Adalberto Cole for routine dialysis support.   DVT prophylaxis: SQ Heparin.  Code Status: FULL CODE>  Family Communication: daughter and grand daughter at bedside.  Disposition Plan: Home.  Consults called: Nephrology.  Admission status: Inpatient.    Paullette Mckain MD FACP. [facility]  If 7PM-7AM, please contact night-coverage [email or web address] Password [email or web address]  05/04/2017, 10:56 AM

## 2017-05-05 LAB — RENAL FUNCTION PANEL
ALBUMIN: 3.2 g/dL — AB (ref 3.5–5.0)
ANION GAP: 11 (ref 5–15)
BUN: 28 mg/dL — ABNORMAL HIGH (ref 6–20)
CO2: 27 mmol/L (ref 22–32)
Calcium: 8.3 mg/dL — ABNORMAL LOW (ref 8.9–10.3)
Chloride: 100 mmol/L — ABNORMAL LOW (ref 101–111)
Creatinine, Ser: 7.54 mg/dL — ABNORMAL HIGH (ref 0.44–1.00)
GFR calc Af Amer: 5 mL/min — ABNORMAL LOW (ref >60)
GFR, EST NON AFRICAN AMERICAN: 4 mL/min — AB (ref >60)
Glucose, Bld: 77 mg/dL (ref 65–99)
PHOSPHORUS: 4.6 mg/dL (ref 2.5–4.6)
POTASSIUM: 4.6 mmol/L (ref 3.5–5.1)
Sodium: 138 mmol/L (ref 135–145)

## 2017-05-05 LAB — CBC
HEMATOCRIT: 32.5 % — AB (ref 36.0–46.0)
HEMOGLOBIN: 10.1 g/dL — AB (ref 12.0–15.0)
MCH: 28.1 pg (ref 26.0–34.0)
MCHC: 31.1 g/dL (ref 30.0–36.0)
MCV: 90.3 fL (ref 78.0–100.0)
Platelets: 216 10*3/uL (ref 150–400)
RBC: 3.6 MIL/uL — ABNORMAL LOW (ref 3.87–5.11)
RDW: 15.8 % — ABNORMAL HIGH (ref 11.5–15.5)
WBC: 7.2 10*3/uL (ref 4.0–10.5)

## 2017-05-05 MED ORDER — LIDOCAINE HCL (PF) 1 % IJ SOLN
5.0000 mL | INTRAMUSCULAR | Status: DC | PRN
Start: 2017-05-05 — End: 2017-05-08

## 2017-05-05 MED ORDER — PENTAFLUOROPROP-TETRAFLUOROETH EX AERO
1.0000 "application " | INHALATION_SPRAY | CUTANEOUS | Status: DC | PRN
  Filled 2017-05-05: qty 30

## 2017-05-05 MED ORDER — SODIUM CHLORIDE 0.9 % IV SOLN: 100.0000 mL | INTRAVENOUS | Status: DC | PRN

## 2017-05-05 MED ORDER — LIDOCAINE-PRILOCAINE 2.5-2.5 % EX CREA
1.0000 "application " | TOPICAL_CREAM | CUTANEOUS | Status: DC | PRN
  Filled 2017-05-05: qty 5

## 2017-05-05 MED ORDER — ACETAMINOPHEN 325 MG PO TABS
325.0000 mg | ORAL_TABLET | Freq: Three times a day (TID) | ORAL | Status: DC
  Administered 2017-05-05 – 2017-05-08 (×8): 325 mg via ORAL
  Filled 2017-05-05 (×8): qty 1

## 2017-05-05 NOTE — Progress Notes (Signed)
PROGRESS NOTE    Patricia Ayala  AFO:[id number] DOB: 25-Dec-1928 DOA: 05/04/2017 PCP: [name], MD    Brief Narrative:  Patricia Ayala is an 83 y.o. female with hx of ESRD on HD (T Th S), hx of anxiety, CHF, HTN, afib previously on anticoagulation, stopped 4/18 due to GI bleed, GERD, lives at home with her husband, admitted for confusion due to narcotics, and for afib with RVR.  She is still tachy and is being titrated on IV Cardiazem.  She is mentally clear today.  Planned for dialysis today.    Assessment & Plan:   Principal Problem:   Delirium, acute Active Problems:   ESRD (end stage renal disease) (HCC)   HLD (hyperlipidemia)   Atrial fibrillation with RVR (HCC)   Generalized anxiety disorder   Delirium  Delirium:  I suspect she has intermittent altered mental status from narcotic use.  She was given an increased dose of her percocet to 10mg  QID prior to her feeling this way.  She is better now, and her neck pain was minimal.  Will Rx with tylenol RTC.   Afib with RVR:  Not a candidate for anticoagulation given major GI blled recently.  Will continue with IV cardiazem, titrate it up and keep in SDU today.  TSH is slightly elevated.  Will continue current dose given afib with RVR.  ESRD on HD TTS:  Have consulted Dr Adalberto Cole for routine dialysis support.   DVT prophylaxis: SQ Heparin.  Code Status: FULL CODE>  Family Communication: daughter and grand daughter at bedside.  Disposition Plan: Home.  Consults called: Nephrology.  Admission status: Inpatient.    Nahiara Kretzschmar MD FACP. [facility]   Antimicrobials: Anti-infectives    None       Subjective:  Feeling much better today.   Objective: Vitals:   05/05/17 0735 05/05/17 0815 05/05/17 0830 05/05/17 0845  BP:  (!) 132/110 (!) 143/55 (!) 136/121  Pulse:  87 (!) 148 (!) 136  Resp: (!) 24 12 (!) 23 (!) 22  Temp: 97.9 F (36.6 C)     TempSrc: Oral     SpO2:  92% 91% 97%  Weight:      Height:         Intake/Output Summary (Last 24 hours) at 05/05/17 0856 Last data filed at 05/04/17 2200  Gross per 24 hour  Intake           158.83 ml  Output                2 ml  Net           156.83 ml   Filed Weights   05/04/17 0632 05/04/17 1240 05/05/17 0500  Weight: 72.6 kg (160 lb) 68.7 kg (151 lb 7.3 oz) 70.3 kg (154 lb 15.7 oz)    Examination:  General exam: Appears calm and comfortable  Respiratory system: Clear to auscultation. Respiratory effort normal. Cardiovascular system: S1 & S2 heard, RRR. No JVD, murmurs, rubs, gallops or clicks. No pedal edema. Gastrointestinal system: Abdomen is nondistended, soft and nontender. No organomegaly or masses felt. Normal bowel sounds heard. Central nervous system: Alert and oriented. No focal neurological deficits. Extremities: Symmetric 5 x 5 power. Skin: No rashes, lesions or ulcers Psychiatry: Judgement and insight appear normal. Mood & affect appropriate.   Data Reviewed: I have personally reviewed following labs and imaging studies  CBC:  Recent Labs Lab 05/04/17 0650 05/05/17 0324  WBC 7.9 7.2  NEUTROABS 5.9  --  HGB 11.5* 10.1*  HCT 36.3 32.5*  MCV 91.0 90.3  PLT 235 216   Basic Metabolic Panel:  Recent Labs Lab 05/04/17 0650 05/05/17 0324  NA 142 138  K 4.4 4.6  CL 100* 100*  CO2 27 27  GLUCOSE 136* 77  BUN 23* 28*  CREATININE 6.58* 7.54*  CALCIUM 9.5 8.3*  PHOS  --  4.6   GFR: Estimated Creatinine Clearance: 5.1 mL/min (A) (by C-G formula based on SCr of 7.54 mg/dL (H)). Liver Function Tests:  Recent Labs Lab 05/04/17 0650 05/05/17 0324  AST 19  --   ALT 20  --   ALKPHOS 60  --   BILITOT 0.8  --   PROT 6.9  --   ALBUMIN 3.9 3.2*    Recent Labs Lab 05/04/17 0650  TROPONINI 0.03*   CBG:  Recent Labs Lab 05/04/17 0636  GLUCAP 127*   Thyroid Function Tests:  Recent Labs  05/04/17 0648  TSH 5.081*   Anemia Panel:  Recent Results (from the past 240 hour(s))  MRSA PCR Screening      Status: None   Collection Time: 05/04/17  1:00 PM  Result Value Ref Range Status   MRSA by PCR NEGATIVE NEGATIVE Final    Comment:        The GeneXpert MRSA Assay (FDA approved for NASAL specimens only), is one component of a comprehensive MRSA colonization surveillance program. It is not intended to diagnose MRSA infection nor to guide or monitor treatment for MRSA infections.      Radiology Studies: Dg Chest 1 View  Result Date: 05/04/2017 CLINICAL DATA:  Patient with shortness of breath and confusion. EXAM: CHEST 1 VIEW COMPARISON:  Chest radiograph 02/18/2017 FINDINGS: Monitoring leads overlie the patient. Stable cardiomegaly. Calcification of the thoracic aorta. Minimal heterogeneous opacities left lung base. No pleural effusion or pneumothorax. IMPRESSION: Cardiomegaly. Heterogeneous opacities left lung base favored to represent atelectasis. Electronically Signed   By: Annia Belt M.D.   On: 05/04/2017 07:49   Ct Head Wo Contrast  Result Date: 05/04/2017 CLINICAL DATA:  Patient with confusion for multiple days. Weakness and neck pain. No known injury. EXAM: CT HEAD WITHOUT CONTRAST CT CERVICAL SPINE WITHOUT CONTRAST TECHNIQUE: Multidetector CT imaging of the head and cervical spine was performed following the standard protocol without intravenous contrast. Multiplanar CT image reconstructions of the cervical spine were also generated. COMPARISON:  CT brain 02/16/2017. FINDINGS: CT HEAD FINDINGS Brain: Ventricles and sulci are prominent compatible with atrophy. Periventricular and subcortical white matter hypodensity compatible with chronic microvascular ischemic changes. Old left greater than right colonic infarcts. Old left occipital lobe infarct. No evidence for acute cortically based infarct, intracranial hemorrhage, mass lesion or mass-effect. Vascular: Internal carotid arterial vascular calcifications. Skull: Intact. Sinuses/Orbits: Paranasal sinuses are well aerated. Mastoid air  cells are unremarkable. Other: None. CT CERVICAL SPINE FINDINGS Alignment: Normal. Skull base and vertebrae: Intact. Mild degenerative changes about the dens. Soft tissues and spinal canal: No prevertebral fluid or swelling. No visible canal hematoma. Disc levels: Multilevel degenerative disc disease throughout the cervical spine including the C3-4, C4-5, C5-6 and C6-7 levels with anterior endplate osteophytosis and disc osteophyte complexes. Multilevel facet degenerative changes most pronounced on the right. No evidence for acute cervical spine fracture. Upper chest: Aortic vascular calcifications. Small bilateral pleural effusions. Other: 2 cm calcified nodule within the right thyroid lobe (image 72; series 4). IMPRESSION: No acute intracranial process. Chronic microvascular ischemic changes. 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 No  acute cervical spine fracture. Multilevel degenerative disc disease. Right thyroid nodule. If not previously performed, recommend further evaluation with thyroid ultrasound. Aortic vascular calcifications. Electronically Signed   By: Annia Belt M.D.   On: 05/04/2017 08:09   Ct Cervical Spine Wo Contrast  Result Date: 05/04/2017 CLINICAL DATA:  Patient with confusion for multiple days. Weakness and neck pain. No known injury. EXAM: CT HEAD WITHOUT CONTRAST CT CERVICAL SPINE WITHOUT CONTRAST TECHNIQUE: Multidetector CT imaging of the head and cervical spine was performed following the standard protocol without intravenous contrast. Multiplanar CT image reconstructions of the cervical spine were also generated. COMPARISON:  CT brain 02/16/2017. FINDINGS: CT HEAD FINDINGS Brain: Ventricles and sulci are prominent compatible with atrophy. Periventricular and subcortical white matter hypodensity compatible with chronic microvascular ischemic changes. Old left greater than right colonic infarcts. Old left occipital lobe infarct. No evidence for acute cortically based  infarct, intracranial hemorrhage, mass lesion or mass-effect. Vascular: Internal carotid arterial vascular calcifications. Skull: Intact. Sinuses/Orbits: Paranasal sinuses are well aerated. Mastoid air cells are unremarkable. Other: None. CT CERVICAL SPINE FINDINGS Alignment: Normal. Skull base and vertebrae: Intact. Mild degenerative changes about the dens. Soft tissues and spinal canal: No prevertebral fluid or swelling. No visible canal hematoma. Disc levels: Multilevel degenerative disc disease throughout the cervical spine including the C3-4, C4-5, C5-6 and C6-7 levels with anterior endplate osteophytosis and disc osteophyte complexes. Multilevel facet degenerative changes most pronounced on the right. No evidence for acute cervical spine fracture. Upper chest: Aortic vascular calcifications. Small bilateral pleural effusions. Other: 2 cm calcified nodule within the right thyroid lobe (image 72; series 4). IMPRESSION: No acute intracranial process. Chronic microvascular ischemic changes. 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 No acute cervical spine fracture. Multilevel degenerative disc disease. Right thyroid nodule. If not previously performed, recommend further evaluation with thyroid ultrasound. Aortic vascular calcifications. Electronically Signed   By: Annia Belt M.D.   On: 05/04/2017 08:09    Scheduled Meds: . cinacalcet  30 mg Oral Q breakfast  . diltiazem  10 mg Intravenous Once  . heparin  5,000 Units Subcutaneous Q8H  . multivitamin  1 tablet Oral Daily  . pantoprazole  40 mg Oral BID  . sevelamer carbonate  1,600 mg Oral TID WC   Continuous Infusions: . diltiazem (CARDIZEM) infusion 10 mg/hr (05/05/17 0753)     LOS: 1 day   Jaxtyn Linville, MD FACP Hospitalist.   If 7PM-7AM, please contact night-coverage [email or web address] Password [facility] 05/05/2017, 8:56 AM

## 2017-05-05 NOTE — Progress Notes (Signed)
Subjective: Interval History: Patient is feeling much better. She didn't have any nausea or vomiting. She complains of some pain of her hand otherwise her pain has gone.  Objective: Vital signs in last 24 hours: Temp:  [97.8 F (36.6 C)-98.6 F (37 C)] 97.9 F (36.6 C) (07/08 0735) Pulse Rate:  [82-195] 136 (07/08 0845) Resp:  [12-31] 22 (07/08 0845) BP: (99-225)/(55-211) 136/121 (07/08 0845) SpO2:  [90 %-100 %] 97 % (07/08 0845) Weight:  [68.7 kg (151 lb 7.3 oz)-70.3 kg (154 lb 15.7 oz)] 70.3 kg (154 lb 15.7 oz) (07/08 0500) Weight change: -3.875 kg (-8 lb 8.7 oz)  Intake/Output from previous day: 07/07 0701 - 07/08 0700 In: 158.8 [I.V.:158.8] Out: 2 [Urine:1; Stool:1] Intake/Output this shift: No intake/output data recorded.  General appearance: alert, cooperative and no distress Resp: clear to auscultation bilaterally Cardio: irregularly irregular rhythm Extremities: edema She has trace edema bilaterally  Lab Results:  Recent Labs  05/04/17 0650 05/05/17 0324  WBC 7.9 7.2  HGB 11.5* 10.1*  HCT 36.3 32.5*  PLT 235 216   BMET:  Recent Labs  05/04/17 0650 05/05/17 0324  NA 142 138  K 4.4 4.6  CL 100* 100*  CO2 27 27  GLUCOSE 136* 77  BUN 23* 28*  CREATININE 6.58* 7.54*  CALCIUM 9.5 8.3*   No results for input(s): PTH in the last 72 hours. Iron Studies: No results for input(s): IRON, TIBC, TRANSFERRIN, FERRITIN in the last 72 hours.  Studies/Results: Dg Chest 1 View  Result Date: 05/04/2017 CLINICAL DATA:  Patient with shortness of breath and confusion. EXAM: CHEST 1 VIEW COMPARISON:  Chest radiograph 02/18/2017 FINDINGS: Monitoring leads overlie the patient. Stable cardiomegaly. Calcification of the thoracic aorta. Minimal heterogeneous opacities left lung base. No pleural effusion or pneumothorax. IMPRESSION: Cardiomegaly. Heterogeneous opacities left lung base favored to represent atelectasis. Electronically Signed   By: Annia Belt M.D.   On: 05/04/2017  07:49   Ct Head Wo Contrast  Result Date: 05/04/2017 CLINICAL DATA:  Patient with confusion for multiple days. Weakness and neck pain. No known injury. EXAM: CT HEAD WITHOUT CONTRAST CT CERVICAL SPINE WITHOUT CONTRAST TECHNIQUE: Multidetector CT imaging of the head and cervical spine was performed following the standard protocol without intravenous contrast. Multiplanar CT image reconstructions of the cervical spine were also generated. COMPARISON:  CT brain 02/16/2017. FINDINGS: CT HEAD FINDINGS Brain: Ventricles and sulci are prominent compatible with atrophy. Periventricular and subcortical white matter hypodensity compatible with chronic microvascular ischemic changes. Old left greater than right colonic infarcts. Old left occipital lobe infarct. No evidence for acute cortically based infarct, intracranial hemorrhage, mass lesion or mass-effect. Vascular: Internal carotid arterial vascular calcifications. Skull: Intact. Sinuses/Orbits: Paranasal sinuses are well aerated. Mastoid air cells are unremarkable. Other: None. CT CERVICAL SPINE FINDINGS Alignment: Normal. Skull base and vertebrae: Intact. Mild degenerative changes about the dens. Soft tissues and spinal canal: No prevertebral fluid or swelling. No visible canal hematoma. Disc levels: Multilevel degenerative disc disease throughout the cervical spine including the C3-4, C4-5, C5-6 and C6-7 levels with anterior endplate osteophytosis and disc osteophyte complexes. Multilevel facet degenerative changes most pronounced on the right. No evidence for acute cervical spine fracture. Upper chest: Aortic vascular calcifications. Small bilateral pleural effusions. Other: 2 cm calcified nodule within the right thyroid lobe (image 72; series 4). IMPRESSION: No acute intracranial process. Chronic microvascular ischemic changes. 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 No acute cervical spine fracture. Multilevel degenerative disc disease. Right thyroid  nodule. If  not previously performed, recommend further evaluation with thyroid ultrasound. Aortic vascular calcifications. Electronically Signed   By: Annia Belt M.D.   On: 05/04/2017 08:09   Ct Cervical Spine Wo Contrast  Result Date: 05/04/2017 CLINICAL DATA:  Patient with confusion for multiple days. Weakness and neck pain. No known injury. EXAM: CT HEAD WITHOUT CONTRAST CT CERVICAL SPINE WITHOUT CONTRAST TECHNIQUE: Multidetector CT imaging of the head and cervical spine was performed following the standard protocol without intravenous contrast. Multiplanar CT image reconstructions of the cervical spine were also generated. COMPARISON:  CT brain 02/16/2017. FINDINGS: CT HEAD FINDINGS Brain: Ventricles and sulci are prominent compatible with atrophy. Periventricular and subcortical white matter hypodensity compatible with chronic microvascular ischemic changes. Old left greater than right colonic infarcts. Old left occipital lobe infarct. No evidence for acute cortically based infarct, intracranial hemorrhage, mass lesion or mass-effect. Vascular: Internal carotid arterial vascular calcifications. Skull: Intact. Sinuses/Orbits: Paranasal sinuses are well aerated. Mastoid air cells are unremarkable. Other: None. CT CERVICAL SPINE FINDINGS Alignment: Normal. Skull base and vertebrae: Intact. Mild degenerative changes about the dens. Soft tissues and spinal canal: No prevertebral fluid or swelling. No visible canal hematoma. Disc levels: Multilevel degenerative disc disease throughout the cervical spine including the C3-4, C4-5, C5-6 and C6-7 levels with anterior endplate osteophytosis and disc osteophyte complexes. Multilevel facet degenerative changes most pronounced on the right. No evidence for acute cervical spine fracture. Upper chest: Aortic vascular calcifications. Small bilateral pleural effusions. Other: 2 cm calcified nodule within the right thyroid lobe (image 72; series 4). IMPRESSION: No acute  intracranial process. Chronic microvascular ischemic changes. 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 No acute cervical spine fracture. Multilevel degenerative disc disease. Right thyroid nodule. If not previously performed, recommend further evaluation with thyroid ultrasound. Aortic vascular calcifications. Electronically Signed   By: Annia Belt M.D.   On: 05/04/2017 08:09    I have reviewed the patient's current medications.  Assessment/Plan: Problem #1 confusion: Most likely secondary to medications including hydrocodone and Ativan. Presently patient seems to be looking much better. She's taking some Tylenol. Problem #2 end-stage renal disease: She is status post hemodialysis on Wednesday. She declined dialysis yesterday. She doesn't have any nausea or vomiting potassium is good. Problem #3 anemia: Her hemoglobin is within our target goal Problem #4 bone and metabolic disorder: Her calcium and phosphorus is range Problem #5 arterial fibrillation: Presently she is on Cardizem: Heart is better but still tachycardic Problem #6 hypertension: Her blood pressures fluctuating but overall reasonably controlled Problem #7 history of headache, neck pain, neck pain. Presently she is feeling better. Plan:1] We'll make arrangements for patient to get dialysis today 2] We will remove 21/2 liters if systolic blood pressure remains above 90 3] we'll check her renal panel in the morning.   LOS: 1 day   Domnic Vantol S 05/05/2017,9:15 AM

## 2017-05-05 NOTE — Procedures (Signed)
    HEMODIALYSIS TREATMENT NOTE:  3h 80mHD completed via left upper arm AVF (16g/antegrade). Goal nearly met: 2.3 liters removed (2.5L goal). HD was ended 20 minutes early due to clotted venous bloodline.  All blood was able to be returned.  Hemostasis achieved in 20 minutes; prolonged bleeding from arterial end of AVF.  Questionable accuracy of BP readings during treatment; BP taken in pt's LLE.  Report given to EDeno Etienne RN.  ARockwell Alexandria RN, CDN

## 2017-05-06 LAB — RENAL FUNCTION PANEL
ALBUMIN: 3 g/dL — AB (ref 3.5–5.0)
Anion gap: 9 (ref 5–15)
BUN: 18 mg/dL (ref 6–20)
CALCIUM: 8.1 mg/dL — AB (ref 8.9–10.3)
CO2: 30 mmol/L (ref 22–32)
CREATININE: 5.38 mg/dL — AB (ref 0.44–1.00)
Chloride: 99 mmol/L — ABNORMAL LOW (ref 101–111)
GFR calc non Af Amer: 6 mL/min — ABNORMAL LOW (ref >60)
GFR, EST AFRICAN AMERICAN: 8 mL/min — AB (ref >60)
Glucose, Bld: 111 mg/dL — ABNORMAL HIGH (ref 65–99)
Phosphorus: 3.1 mg/dL (ref 2.5–4.6)
Potassium: 4.1 mmol/L (ref 3.5–5.1)
SODIUM: 138 mmol/L (ref 135–145)

## 2017-05-06 MED ORDER — SEVELAMER CARBONATE 800 MG PO TABS
800.0000 mg | ORAL_TABLET | Freq: Three times a day (TID) | ORAL | Status: DC
  Administered 2017-05-06 – 2017-05-08 (×7): 800 mg via ORAL
  Filled 2017-05-06 (×7): qty 1

## 2017-05-06 MED ORDER — METOPROLOL TARTRATE 25 MG PO TABS
25.0000 mg | ORAL_TABLET | Freq: Two times a day (BID) | ORAL | Status: DC
  Administered 2017-05-06 – 2017-05-08 (×5): 25 mg via ORAL
  Filled 2017-05-06 (×5): qty 1

## 2017-05-06 NOTE — Plan of Care (Signed)
Problem: Safety: Goal: Ability to remain free from injury will improve Outcome: Progressing Bed in low position, side rails up, call bell and personal items within reach. Bed alarm on   

## 2017-05-06 NOTE — Progress Notes (Signed)
PROGRESS NOTE    Patricia Ayala  QRF:[id number] DOB: January 04, 1929 DOA: 05/04/2017 PCP: [name], MD    Brief Narrative:   Patricia Ayala an 82 y.o.femalewith hx of ESRD on HD (T Th S), hx of anxiety, CHF, HTN, afib previously on anticoagulation, stopped 4/18 due to GI bleed, GERD, lives at home with her husband, admitted for confusion due to narcotics, and for afib with RVR.  Patricia Ayala is still tachy and is being titrated on IV Cardiazem.  Patricia Ayala is mentally clear today. Patricia Ayala has received dialysis.  Her HR is still spiking up to above 160 at times.   Assessment & Plan:   Principal Problem:   Delirium, acute Active Problems:   ESRD (end stage renal disease) (HCC)   HLD (hyperlipidemia)   Atrial fibrillation with RVR (HCC)   Generalized anxiety disorder   Delirium   Delirium: I suspect Patricia Ayala has intermittent altered mental status from narcotic use. Patricia Ayala was given an increased dose of her percocet to 10mg  QID prior to her feeling this way.  Patricia Ayala is better now, and her neck pain was minimal.  Will Rx with tylenol RTC.   Afib with RVR: Not a candidate for anticoagulation given major GI blled recently. Will continue with IV cardiazem, titrate it up and keep in SDU today.  TSH is slightly elevated.  Will continue current dose given afib with RVR.  Will add Lopressor and wean Diltiazem if able.   ESRD on HD TTS: Have consulted Dr Adalberto Cole for routine dialysis support.   DVT prophylaxis:SQ Heparin.  Code Status:FULL CODE> Family Communication:daughter and grand daughter at bedside.  Disposition Plan:Home.  Consults called:Nephrology.  Admission status:Inpatient.     Antimicrobials: Anti-infectives    None       Subjective:  Anxious to go home.   Objective: Vitals:   05/06/17 0400 05/06/17 0430 05/06/17 0500 05/06/17 0600  BP: 119/71 108/67 101/71 137/66  Pulse: (!) 103 100 (!) 102 91  Resp: 15 14 (!) 26 16  Temp:  98 F (36.7 C)    TempSrc:  Oral    SpO2:  97% 97% 91% 96%  Weight:   66.9 kg (147 lb 7.8 oz)   Height:        Intake/Output Summary (Last 24 hours) at 05/06/17 0750 Last data filed at 05/06/17 0400  Gross per 24 hour  Intake           394.33 ml  Output             2304 ml  Net         -1909.67 ml   Filed Weights   05/05/17 0500 05/05/17 1115 05/06/17 0500  Weight: 70.3 kg (154 lb 15.7 oz) 70.5 kg (155 lb 6.8 oz) 66.9 kg (147 lb 7.8 oz)    Examination:  General exam: Appears calm and comfortable  Respiratory system: Clear to auscultation. Respiratory effort normal. Cardiovascular system: S1 & S2 heard, RRR. No JVD, murmurs, rubs, gallops or clicks. No pedal edema. Gastrointestinal system: Abdomen is nondistended, soft and nontender. No organomegaly or masses felt. Normal bowel sounds heard. Central nervous system: Alert and oriented. No focal neurological deficits. Extremities: Symmetric 5 x 5 power. Skin: No rashes, lesions or ulcers Psychiatry: Judgement and insight appear normal. Mood & affect appropriate.   Data Reviewed: I have personally reviewed following labs and imaging studies  CBC:  Recent Labs Lab 05/04/17 0650 05/05/17 0324  WBC 7.9 7.2  NEUTROABS 5.9  --   HGB  11.5* 10.1*  HCT 36.3 32.5*  MCV 91.0 90.3  PLT 235 216   Basic Metabolic Panel:  Recent Labs Lab 05/04/17 0650 05/05/17 0324 05/06/17 0453  NA 142 138 138  K 4.4 4.6 4.1  CL 100* 100* 99*  CO2 27 27 30   GLUCOSE 136* 77 111*  BUN 23* 28* 18  CREATININE 6.58* 7.54* 5.38*  CALCIUM 9.5 8.3* 8.1*  PHOS  --  4.6 3.1   GFR: Estimated Creatinine Clearance: 7.1 mL/min (A) (by C-G formula based on SCr of 5.38 mg/dL (H)). Liver Function Tests:  Recent Labs Lab 05/04/17 0650 05/05/17 0324 05/06/17 0453  AST 19  --   --   ALT 20  --   --   ALKPHOS 60  --   --   BILITOT 0.8  --   --   PROT 6.9  --   --   ALBUMIN 3.9 3.2* 3.0*   Cardiac Enzymes:  Recent Labs Lab 05/04/17 0650  TROPONINI 0.03*   CBG:  Recent Labs Lab  05/04/17 0636  GLUCAP 127*   Lipid Profile: No results for input(s): CHOL, HDL, LDLCALC, TRIG, CHOLHDL, LDLDIRECT in the last 72 hours. Thyroid Function Tests:  Recent Labs  05/04/17 0648  TSH 5.081*    Recent Results (from the past 240 hour(s))  MRSA PCR Screening     Status: None   Collection Time: 05/04/17  1:00 PM  Result Value Ref Range Status   MRSA by PCR NEGATIVE NEGATIVE Final    Comment:        The GeneXpert MRSA Assay (FDA approved for NASAL specimens only), is one component of a comprehensive MRSA colonization surveillance program. It is not intended to diagnose MRSA infection nor to guide or monitor treatment for MRSA infections.      Radiology Studies: Ct Head Wo Contrast  Result Date: 05/04/2017 CLINICAL DATA:  Patient with confusion for multiple days. Weakness and neck pain. No known injury. EXAM: CT HEAD WITHOUT CONTRAST CT CERVICAL SPINE WITHOUT CONTRAST TECHNIQUE: Multidetector CT imaging of the head and cervical spine was performed following the standard protocol without intravenous contrast. Multiplanar CT image reconstructions of the cervical spine were also generated. COMPARISON:  CT brain 02/16/2017. FINDINGS: CT HEAD FINDINGS Brain: Ventricles and sulci are prominent compatible with atrophy. Periventricular and subcortical white matter hypodensity compatible with chronic microvascular ischemic changes. Old left greater than right colonic infarcts. Old left occipital lobe infarct. No evidence for acute cortically based infarct, intracranial hemorrhage, mass lesion or mass-effect. Vascular: Internal carotid arterial vascular calcifications. Skull: Intact. Sinuses/Orbits: Paranasal sinuses are well aerated. Mastoid air cells are unremarkable. Other: None. CT CERVICAL SPINE FINDINGS Alignment: Normal. Skull base and vertebrae: Intact. Mild degenerative changes about the dens. Soft tissues and spinal canal: No prevertebral fluid or swelling. No visible canal  hematoma. Disc levels: Multilevel degenerative disc disease throughout the cervical spine including the C3-4, C4-5, C5-6 and C6-7 levels with anterior endplate osteophytosis and disc osteophyte complexes. Multilevel facet degenerative changes most pronounced on the right. No evidence for acute cervical spine fracture. Upper chest: Aortic vascular calcifications. Small bilateral pleural effusions. Other: 2 cm calcified nodule within the right thyroid lobe (image 72; series 4). IMPRESSION: No acute intracranial process. Chronic microvascular ischemic changes. 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 No acute cervical spine fracture. Multilevel degenerative disc disease. Right thyroid nodule. If not previously performed, recommend further evaluation with thyroid ultrasound. Aortic vascular calcifications. Electronically Signed   By: Francis Gaines.D.  On: 05/04/2017 08:09   Ct Cervical Spine Wo Contrast  Result Date: 05/04/2017 CLINICAL DATA:  Patient with confusion for multiple days. Weakness and neck pain. No known injury. EXAM: CT HEAD WITHOUT CONTRAST CT CERVICAL SPINE WITHOUT CONTRAST TECHNIQUE: Multidetector CT imaging of the head and cervical spine was performed following the standard protocol without intravenous contrast. Multiplanar CT image reconstructions of the cervical spine were also generated. COMPARISON:  CT brain 02/16/2017. FINDINGS: CT HEAD FINDINGS Brain: Ventricles and sulci are prominent compatible with atrophy. Periventricular and subcortical white matter hypodensity compatible with chronic microvascular ischemic changes. Old left greater than right colonic infarcts. Old left occipital lobe infarct. No evidence for acute cortically based infarct, intracranial hemorrhage, mass lesion or mass-effect. Vascular: Internal carotid arterial vascular calcifications. Skull: Intact. Sinuses/Orbits: Paranasal sinuses are well aerated. Mastoid air cells are unremarkable. Other: None. CT  CERVICAL SPINE FINDINGS Alignment: Normal. Skull base and vertebrae: Intact. Mild degenerative changes about the dens. Soft tissues and spinal canal: No prevertebral fluid or swelling. No visible canal hematoma. Disc levels: Multilevel degenerative disc disease throughout the cervical spine including the C3-4, C4-5, C5-6 and C6-7 levels with anterior endplate osteophytosis and disc osteophyte complexes. Multilevel facet degenerative changes most pronounced on the right. No evidence for acute cervical spine fracture. Upper chest: Aortic vascular calcifications. Small bilateral pleural effusions. Other: 2 cm calcified nodule within the right thyroid lobe (image 72; series 4). IMPRESSION: No acute intracranial process. Chronic microvascular ischemic changes. 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 No acute cervical spine fracture. Multilevel degenerative disc disease. Right thyroid nodule. If not previously performed, recommend further evaluation with thyroid ultrasound. Aortic vascular calcifications. Electronically Signed   By: Annia Belt M.D.   On: 05/04/2017 08:09    Scheduled Meds: . acetaminophen  325 mg Oral TID  . cinacalcet  30 mg Oral Q breakfast  . diltiazem  10 mg Intravenous Once  . heparin  5,000 Units Subcutaneous Q8H  . metoprolol tartrate  25 mg Oral BID  . multivitamin  1 tablet Oral Daily  . pantoprazole  40 mg Oral BID  . sevelamer carbonate  1,600 mg Oral TID WC   Continuous Infusions: . sodium chloride    . sodium chloride    . diltiazem (CARDIZEM) infusion 15 mg/hr (05/05/17 2147)     LOS: 2 days   Jafeth Mustin, MD FACP Hospitalist.   If 7PM-7AM, please contact night-coverage [email or web address] Password [email or web address] 05/06/2017, 7:50 AM

## 2017-05-06 NOTE — Progress Notes (Signed)
Subjective: Interval History: Patient is feeling better and wanted to go home. Presently she denies any difficulty breathing.  Objective: Vital signs in last 24 hours: Temp:  [98 F (36.7 C)-99 F (37.2 C)] 98 F (36.7 C) (07/09 0430) Pulse Rate:  [86-136] 95 (07/09 0800) Resp:  [12-26] 12 (07/09 0800) BP: (101-167)/(59-139) 112/85 (07/09 0800) SpO2:  [75 %-100 %] 97 % (07/09 0800) Weight:  [66.9 kg (147 lb 7.8 oz)-70.5 kg (155 lb 6.8 oz)] 66.9 kg (147 lb 7.8 oz) (07/09 0500) Weight change: 1.8 kg (3 lb 15.5 oz)  Intake/Output from previous day: 07/08 0701 - 07/09 0700 In: 409.3 [I.V.:409.3] Out: 2304  Intake/Output this shift: Total I/O In: 15 [I.V.:15] Out: -   General appearance: alert, cooperative and no distress Resp: clear to auscultation bilaterally Cardio: irregularly irregular rhythm Extremities: edema She has trace edema bilaterally  Lab Results:  Recent Labs  05/04/17 0650 05/05/17 0324  WBC 7.9 7.2  HGB 11.5* 10.1*  HCT 36.3 32.5*  PLT 235 216   BMET:   Recent Labs  05/05/17 0324 05/06/17 0453  NA 138 138  K 4.6 4.1  CL 100* 99*  CO2 27 30  GLUCOSE 77 111*  BUN 28* 18  CREATININE 7.54* 5.38*  CALCIUM 8.3* 8.1*   No results for input(s): PTH in the last 72 hours. Iron Studies: No results for input(s): IRON, TIBC, TRANSFERRIN, FERRITIN in the last 72 hours.  Studies/Results: No results found.  I have reviewed the patient's current medications.  Assessment/Plan: Problem #1 confusion: Most likely secondary to medications including hydrocodone and Ativan. Patient is alert and oriented to time and place and person. She has returned to her baseline. Problem #2 end-stage renal disease: She is status post hemodialysis yesterday. Presently she doesn't have any uremic sinus symptoms. Problem #3 anemia: Her hemoglobin remains within our target goal Problem #4 bone and metabolic disorder: Her calcium and phosphorus is range Problem #5 arterial  fibrillation: Presently she is on Cardizem: Heart is better but still tachycardic. Problem #6 hypertension: Her blood pressures fluctuating but overall reasonably controlled Problem #7 history of headache, neck pain, neck pain. Presently she is feeling better. Plan:1] patient does not require dialysis today but will dialyze her tomorrow 2] We will remove 21/2 liters if systolic blood pressure remains above 90 3] we'll check her renal panel in the morning.   LOS: 2 days   Markasia Carrol S 05/06/2017,9:18 AM

## 2017-05-07 LAB — CBC
HCT: 32 % — ABNORMAL LOW (ref 36.0–46.0)
HEMOGLOBIN: 10 g/dL — AB (ref 12.0–15.0)
MCH: 28.2 pg (ref 26.0–34.0)
MCHC: 31.3 g/dL (ref 30.0–36.0)
MCV: 90.4 fL (ref 78.0–100.0)
PLATELETS: 196 10*3/uL (ref 150–400)
RBC: 3.54 MIL/uL — AB (ref 3.87–5.11)
RDW: 15.7 % — ABNORMAL HIGH (ref 11.5–15.5)
WBC: 8.1 10*3/uL (ref 4.0–10.5)

## 2017-05-07 LAB — RENAL FUNCTION PANEL
ANION GAP: 6 (ref 5–15)
Albumin: 2.8 g/dL — ABNORMAL LOW (ref 3.5–5.0)
BUN: 29 mg/dL — ABNORMAL HIGH (ref 6–20)
CHLORIDE: 102 mmol/L (ref 101–111)
CO2: 30 mmol/L (ref 22–32)
Calcium: 7.9 mg/dL — ABNORMAL LOW (ref 8.9–10.3)
Creatinine, Ser: 6.76 mg/dL — ABNORMAL HIGH (ref 0.44–1.00)
GFR calc non Af Amer: 5 mL/min — ABNORMAL LOW (ref >60)
GFR, EST AFRICAN AMERICAN: 6 mL/min — AB (ref >60)
Glucose, Bld: 95 mg/dL (ref 65–99)
Phosphorus: 3.9 mg/dL (ref 2.5–4.6)
Potassium: 4.5 mmol/L (ref 3.5–5.1)
Sodium: 138 mmol/L (ref 135–145)

## 2017-05-07 MED ORDER — HEPARIN SODIUM (PORCINE) 1000 UNIT/ML DIALYSIS
20.0000 [IU]/kg | INTRAMUSCULAR | Status: DC | PRN
  Administered 2017-05-07: 1300 [IU] via INTRAVENOUS_CENTRAL
  Filled 2017-05-07 (×2): qty 2

## 2017-05-07 MED ORDER — PROMETHAZINE HCL 25 MG/ML IJ SOLN
12.5000 mg | Freq: Four times a day (QID) | INTRAMUSCULAR | Status: DC | PRN
  Administered 2017-05-07: 12.5 mg via INTRAVENOUS
  Filled 2017-05-07: qty 1

## 2017-05-07 MED ORDER — DILTIAZEM HCL ER COATED BEADS 180 MG PO CP24
360.0000 mg | ORAL_CAPSULE | Freq: Every day | ORAL | Status: DC
  Administered 2017-05-07 – 2017-05-08 (×2): 360 mg via ORAL
  Filled 2017-05-07 (×2): qty 2

## 2017-05-07 MED ORDER — SODIUM CHLORIDE 0.9 % IV SOLN: 100.0000 mL | INTRAVENOUS | Status: DC | PRN

## 2017-05-07 MED ORDER — PENTAFLUOROPROP-TETRAFLUOROETH EX AERO
1.0000 "application " | INHALATION_SPRAY | CUTANEOUS | Status: DC | PRN
  Filled 2017-05-07: qty 30

## 2017-05-07 NOTE — Progress Notes (Signed)
PT TRANSFERRING TO ROOM 301 ON TELEMETRY. HR ATRIAL FIB AT RATE 65F 80'S TO 90'S. PT IS ALERT AND ORIENTED. TOLERATED DIALYSIS WELL TODAY. TRANSFER REPORT CALLED TO LISA RN ON 300. HUSBAND AT BEDSIDE.

## 2017-05-07 NOTE — Care Management Important Message (Signed)
Important Message  Patient Details  Name: Patricia Ayala MRN: [id number] Date of Birth: Aug 04, 1929   Medicare Important Message Given:  Yes    Nellie Pester, Chrystine Oiler, RN 05/07/2017, 3:16 PM

## 2017-05-07 NOTE — Progress Notes (Signed)
PROGRESS NOTE    SUPRIYA KLAMMER  EPP:[id number] DOB: 1929-09-07 DOA: 05/04/2017 PCP: [name], MD    Brief Narrative: Patricia Ayala Johnsonis an 83 y.o.femalewith hx of ESRD on HD (T Th S), hx of anxiety, CHF, HTN, afib previously on anticoagulation, stopped 4/18 due to GI bleed, GERD, lives at home with her husband, admitted for confusion due to narcotics, and for afib with RVR. Her mental status has resolved.  Her HR has improved and is 90-100 today.   She wanted to go home.    Assessment & Plan:   Principal Problem:   Delirium, acute Active Problems:   ESRD (end stage renal disease) (HCC)   HLD (hyperlipidemia)   Atrial fibrillation with RVR (HCC)   Generalized anxiety disorder   Delirium   Delirium: I suspect she has intermittent altered mental status from narcotic use. She was given an increased dose of her percocet to 10mg  QID prior to her feeling this way. She is better now, and her neck pain was minimal. Will Rx with tylenol RTC.  She has become lucid.   Afib with RVR: Not a candidate for anticoagulation given major GI blled recently. BB has helped.  She has been on cardiazem at 360mg  per day.  Will transition to oral meds, transfer to telemetry, increase ambulation, and plan to d/c home tomorrow.   ESRD on HD TTS: Have consulted Dr Adalberto Cole for routine dialysis support.   DVT prophylaxis:SQ Heparin.  Code Status:FULL CODE> Family Communication:daughter and grand daughter at bedside.  Disposition Plan:Home.  Consults called:Nephrology.  Admission status:Inpatient.   Consultants:   Nephrology.   Procedures:   Dialysis.   Antimicrobials: Anti-infectives    None       Subjective:  Patient has no complaints.   Objective: Vitals:   05/07/17 0845 05/07/17 0900 05/07/17 0915 05/07/17 0930  BP: 119/68 129/80 129/75   Pulse: (!) 101 (!) 104 (!) 104 97  Resp: (!) 23 (!) 23 19 15   Temp:      TempSrc:      SpO2:      Weight:      Height:         Intake/Output Summary (Last 24 hours) at 05/07/17 0939 Last data filed at 05/07/17 0800  Gross per 24 hour  Intake           953.75 ml  Output                1 ml  Net           952.75 ml   Filed Weights   05/06/17 2200 05/07/17 0500 05/07/17 0815  Weight: 70 kg (154 lb 5.2 oz) 70 kg (154 lb 5.2 oz) 70 kg (154 lb 5.2 oz)    Examination:  General exam: Appears calm and comfortable  Respiratory system: Clear to auscultation. Respiratory effort normal. Cardiovascular system: S1 & S2 heard, RRR. No JVD, murmurs, rubs, gallops or clicks. No pedal edema. Gastrointestinal system: Abdomen is nondistended, soft and nontender. No organomegaly or masses felt. Normal bowel sounds heard. Central nervous system: Alert and oriented. No focal neurological deficits. Extremities: Symmetric 5 x 5 power. Skin: No rashes, lesions or ulcers Psychiatry: Judgement and insight appear normal. Mood & affect appropriate.   Data Reviewed: I have personally reviewed following labs and imaging studies  CBC:  Recent Labs Lab 05/04/17 0650 05/05/17 0324 05/07/17 0406  WBC 7.9 7.2 8.1  NEUTROABS 5.9  --   --   HGB 11.5* 10.1*  10.0*  HCT 36.3 32.5* 32.0*  MCV 91.0 90.3 90.4  PLT 235 216 196   Basic Metabolic Panel:  Recent Labs Lab 05/04/17 0650 05/05/17 0324 05/06/17 0453 05/07/17 0406  NA 142 138 138 138  K 4.4 4.6 4.1 4.5  CL 100* 100* 99* 102  CO2 27 27 30 30   GLUCOSE 136* 77 111* 95  BUN 23* 28* 18 29*  CREATININE 6.58* 7.54* 5.38* 6.76*  CALCIUM 9.5 8.3* 8.1* 7.9*  PHOS  --  4.6 3.1 3.9   GFR: Estimated Creatinine Clearance: 5.7 mL/min (A) (by C-G formula based on SCr of 6.76 mg/dL (H)).  Liver Function Tests:  Recent Labs Lab 05/04/17 0650 05/05/17 0324 05/06/17 0453 05/07/17 0406  AST 19  --   --   --   ALT 20  --   --   --   ALKPHOS 60  --   --   --   BILITOT 0.8  --   --   --   PROT 6.9  --   --   --   ALBUMIN 3.9 3.2* 3.0* 2.8*   Cardiac Enzymes:  Recent  Labs Lab 05/04/17 0650  TROPONINI 0.03*   CBG:  Recent Labs Lab 05/04/17 0636  GLUCAP 127*     Recent Results (from the past 240 hour(s))  MRSA PCR Screening     Status: None   Collection Time: 05/04/17  1:00 PM  Result Value Ref Range Status   MRSA by PCR NEGATIVE NEGATIVE Final    Comment:        The GeneXpert MRSA Assay (FDA approved for NASAL specimens only), is one component of a comprehensive MRSA colonization surveillance program. It is not intended to diagnose MRSA infection nor to guide or monitor treatment for MRSA infections.      Radiology Studies: No results found.  Scheduled Meds: . acetaminophen  325 mg Oral TID  . cinacalcet  30 mg Oral Q breakfast  . diltiazem  360 mg Oral Daily  . diltiazem  10 mg Intravenous Once  . heparin  5,000 Units Subcutaneous Q8H  . metoprolol tartrate  25 mg Oral BID  . multivitamin  1 tablet Oral Daily  . pantoprazole  40 mg Oral BID  . sevelamer carbonate  800 mg Oral TID WC   Continuous Infusions: . sodium chloride    . sodium chloride    . sodium chloride    . sodium chloride    . diltiazem (CARDIZEM) infusion Stopped (05/07/17 0700)     LOS: 3 days   Lizzett Nobile, MD FACP Hospitalist.   If 7PM-7AM, please contact night-coverage [email or web address] Password [facility] 05/07/2017, 9:39 AM

## 2017-05-07 NOTE — Procedures (Signed)
HEMODIALYSIS TREATMENT NOTE:  4 hour low-heparin dialysis completed via left upper arm AVF (16g/antegrade). Goal met: 2.5 liters removed without interruption in ultrafiltration.  Hemodynamically stable throughout HD session.  All blood was returned and hemostasis was achieved within 15 minutes.  Report given to Cindy Phillips, RN.   , RN, CDN 

## 2017-05-07 NOTE — Care Management Note (Signed)
Case Management Note  Patient Details  Name: Patricia Ayala MRN: [id number] Date of Birth: 12/24/1928  Subjective/Objective:   Adm with acute delirium. Back to baseline, alert and oriented. From home with husband. Walks with cane mostly, has RW if needed. Has dialysis TTS schedule at [facility]. Husband drives her to appointments. Daughter helps with bathing at times. Patient declines Home health services.                  Action/Plan: Plans to return home with self care. No CM needs.    Expected Discharge Date:     05/08/2017             Expected Discharge Plan:  Home/Self Care  In-House Referral:     Discharge planning Services  CM Consult  Post Acute Care Choice:    Choice offered to:  NA  DME Arranged:    DME Agency:     HH Arranged:    HH Agency:     Status of Service:  Completed, signed off  If discussed at [employer] of Stay Meetings, dates discussed:    Additional Comments:  Dimitriy Carreras, Chrystine Oiler, RN 05/07/2017, 2:48 PM

## 2017-05-07 NOTE — Progress Notes (Signed)
Subjective: Interval History: Patient offers no complaints. She feels much better and no difficulty breathing.  Objective: Vital signs in last 24 hours: Temp:  [98.1 F (36.7 C)-98.8 F (37.1 C)] 98.6 F (37 C) (07/10 0400) Pulse Rate:  [73-114] 80 (07/10 0700) Resp:  [12-26] 22 (07/10 0700) BP: (92-137)/(61-95) 124/95 (07/10 0700) SpO2:  [89 %-100 %] 95 % (07/10 0700) Weight:  [70 kg (154 lb 5.2 oz)] 70 kg (154 lb 5.2 oz) (07/10 0500) Weight change: -0.5 kg (-1 lb 1.6 oz)  Intake/Output from previous day: 07/09 0701 - 07/10 0700 In: 743.8 [P.O.:529; I.V.:214.8] Out: 1 [Stool:1] Intake/Output this shift: No intake/output data recorded.  General appearance: alert, cooperative and no distress Resp: clear to auscultation bilaterally Cardio: irregularly irregular rhythm Extremities: edema She has trace edema bilaterally  Lab Results:  Recent Labs  05/05/17 0324 05/07/17 0406  WBC 7.2 8.1  HGB 10.1* 10.0*  HCT 32.5* 32.0*  PLT 216 196   BMET:   Recent Labs  05/06/17 0453 05/07/17 0406  NA 138 138  K 4.1 4.5  CL 99* 102  CO2 30 30  GLUCOSE 111* 95  BUN 18 29*  CREATININE 5.38* 6.76*  CALCIUM 8.1* 7.9*   No results for input(s): PTH in the last 72 hours. Iron Studies: No results for input(s): IRON, TIBC, TRANSFERRIN, FERRITIN in the last 72 hours.  Studies/Results: No results found.  I have reviewed the patient's current medications.  Assessment/Plan: Problem #1 confusion: Most likely secondary to medications including hydrocodone and Ativan. Patient is alert and oriented. Has returned to her baseline. Problem #2 end-stage renal disease: She is status post hemodialysis on Tuesday. Her potassium is normal. She is due for dialysis today. Problem #3 anemia: Her hemoglobin is within our target goal Problem #4 bone and metabolic disorder: Her calcium and phosphorus is range Problem #5 arterial fibrillation: Presently she is on Cardizem: Heart is  controlled. Problem #6 hypertension: Her blood pressures seems to be reasonably controlled. Problem #7 history of headache, neck pain, neck pain. Presently she is feeling better. Plan:1] We'll make arrangements for patient to get dialysis today 2] We will remove 21/2 liters if systolic blood pressure remains above 90 3] we'll check her renal panel in the morning.   LOS: 3 days   Leronda Lewers S 05/07/2017,7:51 AM

## 2017-05-08 DIAGNOSIS — N186 End stage renal disease: Secondary | ICD-10-CM

## 2017-05-08 DIAGNOSIS — R410 Disorientation, unspecified: Secondary | ICD-10-CM

## 2017-05-08 DIAGNOSIS — I4891 Unspecified atrial fibrillation: Secondary | ICD-10-CM

## 2017-05-08 MED ORDER — DILTIAZEM HCL ER COATED BEADS 360 MG PO CP24: 360.0000 mg | ORAL_CAPSULE | Freq: Every day | ORAL | 1 refills | Status: DC

## 2017-05-08 NOTE — Progress Notes (Signed)
Patricia Ayala  MRN: [id number]  DOB/AGE: 1928-12-17 83 y.o.  Primary Care Physician:Fusco, Lyman Bishop, MD  Admit date: 05/04/2017  Chief Complaint:  Chief Complaint  Patient presents with  . Altered Mental Status  . Weakness    S-Pt presented on  05/04/2017 with  Chief Complaint  Patient presents with  . Altered Mental Status  . Weakness  .    Pt offers no new complaints.   Meds  . acetaminophen  325 mg Oral TID  . cinacalcet  30 mg Oral Q breakfast  . diltiazem  360 mg Oral Daily  . diltiazem  10 mg Intravenous Once  . heparin  5,000 Units Subcutaneous Q8H  . metoprolol tartrate  25 mg Oral BID  . multivitamin  1 tablet Oral Daily  . pantoprazole  40 mg Oral BID  . sevelamer carbonate  800 mg Oral TID WC        Physical Exam: Vital signs in last 24 hours: Temp:  [98.1 F (36.7 C)-98.5 F (36.9 C)] 98.3 F (36.8 C) (07/11 0625) Pulse Rate:  [80-113] 87 (07/11 0625) Resp:  [12-23] 20 (07/11 0625) BP: (94-156)/(68-98) 122/74 (07/11 0625) SpO2:  [66 %-100 %] 97 % (07/11 0625) Weight:  [149 lb 14.6 oz (68 kg)-154 lb 5.2 oz (70 kg)] 150 lb 2.1 oz (68.1 kg) (07/11 0625) Weight change: 0 lb (0 kg) Last BM Date: 05/07/17  Intake/Output from previous day: 07/10 0701 - 07/11 0700 In: 720 [P.O.:720] Out: 2501 [Stool:1] No intake/output data recorded.   Physical Exam: General- pt is awake,follows commands. Resp- No acute REsp distress, minimal Rhonchi+ CVS- S1S2 regular in rate and rhythm GIT- BS+, soft, NT, ND EXT- No  LE Edema, NO Cyanosis Access- AVF +    Lab Results: CBC  Recent Labs  05/07/17 0406  WBC 8.1  HGB 10.0*  HCT 32.0*  PLT 196    BMET  Recent Labs  05/06/17 0453 05/07/17 0406  NA 138 138  K 4.1 4.5  CL 99* 102  CO2 30 30  GLUCOSE 111* 95  BUN 18 29*  CREATININE 5.38* 6.76*  CALCIUM 8.1* 7.9*    MICRO Recent Results (from the past 240 hour(s))  MRSA PCR Screening     Status: None   Collection Time: 05/04/17  1:00 PM   Result Value Ref Range Status   MRSA by PCR NEGATIVE NEGATIVE Final    Comment:        The GeneXpert MRSA Assay (FDA approved for NASAL specimens only), is one component of a comprehensive MRSA colonization surveillance program. It is not intended to diagnose MRSA infection nor to guide or monitor treatment for MRSA infections.       Lab Results  Component Value Date   CALCIUM 7.9 (L) 05/07/2017   CAION 1.07 (L) 11/05/2014   PHOS 3.9 05/07/2017               Impression: 1)Renal  ESRD on HD                Pt is on Tue/Thurs/saturday schedule                Pt was dialyzed yesterday.                Pt is  to be dialyzed tomorrow   2)HTN BP  at goal  Medication Calcium blockers Beta blockers   3)Anemia In ESRD the goal for HGb is 9--11. Hgb at goal Will keep on epo  4)CKD Mineral-Bone Disorder Phosphorus is at goal     On binders. Calcium is  at goal.  5)CNS- Pt admitted with delirium Sec to Medications Primary MD following  6)Electrolytes  Normokalemic  Normonatremic   7)Acid base Co2 at goal  8) CVS- hx of Afib Anticoagulation d/ced sec to GI bleed Primary team followiing   Plan:  Will continue current care No need of Hd today If still inpt, will dialyze in am     BHUTANI,MANPREET S 05/08/2017, 6:33 AM

## 2017-05-08 NOTE — Progress Notes (Signed)
Discharge instructions gone over with patient and son, verbalized understanding. IV removed, patient tolerated procedure well. 

## 2017-05-08 NOTE — Discharge Summary (Signed)
Physician Discharge Summary  Patricia Ayala:[id number] DOB: Apr 23, 1929 DOA: 05/04/2017  PCP: [name], MD  Admit date: 05/04/2017 Discharge date: 05/08/2017  Admitted From: home Disposition:  home  Recommendations for Outpatient Follow-up:  1. Follow up with PCP in 1-2 weeks 2. Please obtain BMP/CBC in one week  Discharge Condition: stable CODE STATUS: full code Diet recommendation: Heart Healthy  Brief/Interim Summary: 83 y/o Female with history of end-stage renal disease on dialysis, atrial fibrillation, presented to the hospital with confusion related to narcotics. She was also noted to be in rapid atrial fibrillation.  Discharge Diagnoses:  Principal Problem:   Delirium, acute Active Problems:   ESRD (end stage renal disease) (HCC)   HLD (hyperlipidemia)   Atrial fibrillation with RVR (HCC)   Generalized anxiety disorder   Delirium  1. Delirium. Patient was noted to be confused and delirious on admission. This was felt to be related to narcotic use. Narcotics were held on admission and mental status began to improve. Would recommend Tylenol for further pain management. Mental status appears back to baseline. 2. Atrial fibrillation with rapid ventricular response. Not candidate for anticoagulation due to major GI bleed recently. She was continued on diltiazem as well as metoprolol. Heart rate appears to be reasonably controlled. 3. End-stage renal disease on hemodialysis. Followed by nephrology and was dialyzed yesterday without incident. Follow-up at her usual dialysis center tomorrow for regularly scheduled dialysis.  Discharge Instructions  Discharge Instructions    Diet - low sodium heart healthy    Complete by:  As directed    Increase activity slowly    Complete by:  As directed      Allergies as of 05/08/2017   No Known Allergies     Medication List    STOP taking these medications   HYDROcodone-acetaminophen 10-325 MG tablet Commonly known as:   NORCO   Hydrocodone-Acetaminophen 2.5-325 MG Tabs   zolpidem 10 MG tablet Commonly known as:  AMBIEN     TAKE these medications   acetaminophen 325 MG tablet Commonly known as:  TYLENOL Take 650 mg by mouth every 4 (four) hours as needed.   ALPRAZolam 0.5 MG tablet Commonly known as:  XANAX Take 1 tablet by mouth 3 (three) times daily as needed.   cinacalcet 30 MG tablet Commonly known as:  SENSIPAR Take 30 mg by mouth daily.   diltiazem 360 MG 24 hr capsule Commonly known as:  CARDIZEM CD Take 1 capsule (360 mg total) by mouth daily. What changed:  medication strength  how much to take  how to take this  when to take this  additional instructions   metoprolol tartrate 25 MG tablet Commonly known as:  LOPRESSOR Take 1 tablet (25 mg total) by mouth 2 (two) times daily.   multivitamin Tabs tablet Take 1 tablet by mouth daily.   pantoprazole 40 MG tablet Commonly known as:  PROTONIX Take 1 tablet (40 mg total) by mouth 2 (two) times daily.   promethazine 25 MG tablet Commonly known as:  PHENERGAN Take 25 mg by mouth every 6 (six) hours as needed for nausea or vomiting.   sevelamer carbonate 800 MG tablet Commonly known as:  RENVELA Take 1,600 mg by mouth 3 (three) times daily with meals. Take 800mg  with snacks daily.       No Known Allergies  Consultations:  Nephrology   Procedures/Studies: Dg Chest 1 View  Result Date: 05/04/2017 CLINICAL DATA:  Patient with shortness of breath and confusion. EXAM: CHEST 1 VIEW  COMPARISON:  Chest radiograph 02/18/2017 FINDINGS: Monitoring leads overlie the patient. Stable cardiomegaly. Calcification of the thoracic aorta. Minimal heterogeneous opacities left lung base. No pleural effusion or pneumothorax. IMPRESSION: Cardiomegaly. Heterogeneous opacities left lung base favored to represent atelectasis. Electronically Signed   By: Annia Belt M.D.   On: 05/04/2017 07:49   Ct Head Wo Contrast  Result Date:  05/04/2017 CLINICAL DATA:  Patient with confusion for multiple days. Weakness and neck pain. No known injury. EXAM: CT HEAD WITHOUT CONTRAST CT CERVICAL SPINE WITHOUT CONTRAST TECHNIQUE: Multidetector CT imaging of the head and cervical spine was performed following the standard protocol without intravenous contrast. Multiplanar CT image reconstructions of the cervical spine were also generated. COMPARISON:  CT brain 02/16/2017. FINDINGS: CT HEAD FINDINGS Brain: Ventricles and sulci are prominent compatible with atrophy. Periventricular and subcortical white matter hypodensity compatible with chronic microvascular ischemic changes. Old left greater than right colonic infarcts. Old left occipital lobe infarct. No evidence for acute cortically based infarct, intracranial hemorrhage, mass lesion or mass-effect. Vascular: Internal carotid arterial vascular calcifications. Skull: Intact. Sinuses/Orbits: Paranasal sinuses are well aerated. Mastoid air cells are unremarkable. Other: None. CT CERVICAL SPINE FINDINGS Alignment: Normal. Skull base and vertebrae: Intact. Mild degenerative changes about the dens. Soft tissues and spinal canal: No prevertebral fluid or swelling. No visible canal hematoma. Disc levels: Multilevel degenerative disc disease throughout the cervical spine including the C3-4, C4-5, C5-6 and C6-7 levels with anterior endplate osteophytosis and disc osteophyte complexes. Multilevel facet degenerative changes most pronounced on the right. No evidence for acute cervical spine fracture. Upper chest: Aortic vascular calcifications. Small bilateral pleural effusions. Other: 2 cm calcified nodule within the right thyroid lobe (image 72; series 4). IMPRESSION: No acute intracranial process. Chronic microvascular ischemic changes. 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 No acute cervical spine fracture. Multilevel degenerative disc disease. Right thyroid nodule. If not previously performed, recommend  further evaluation with thyroid ultrasound. Aortic vascular calcifications. Electronically Signed   By: Annia Belt M.D.   On: 05/04/2017 08:09   Ct Cervical Spine Wo Contrast  Result Date: 05/04/2017 CLINICAL DATA:  Patient with confusion for multiple days. Weakness and neck pain. No known injury. EXAM: CT HEAD WITHOUT CONTRAST CT CERVICAL SPINE WITHOUT CONTRAST TECHNIQUE: Multidetector CT imaging of the head and cervical spine was performed following the standard protocol without intravenous contrast. Multiplanar CT image reconstructions of the cervical spine were also generated. COMPARISON:  CT brain 02/16/2017. FINDINGS: CT HEAD FINDINGS Brain: Ventricles and sulci are prominent compatible with atrophy. Periventricular and subcortical white matter hypodensity compatible with chronic microvascular ischemic changes. Old left greater than right colonic infarcts. Old left occipital lobe infarct. No evidence for acute cortically based infarct, intracranial hemorrhage, mass lesion or mass-effect. Vascular: Internal carotid arterial vascular calcifications. Skull: Intact. Sinuses/Orbits: Paranasal sinuses are well aerated. Mastoid air cells are unremarkable. Other: None. CT CERVICAL SPINE FINDINGS Alignment: Normal. Skull base and vertebrae: Intact. Mild degenerative changes about the dens. Soft tissues and spinal canal: No prevertebral fluid or swelling. No visible canal hematoma. Disc levels: Multilevel degenerative disc disease throughout the cervical spine including the C3-4, C4-5, C5-6 and C6-7 levels with anterior endplate osteophytosis and disc osteophyte complexes. Multilevel facet degenerative changes most pronounced on the right. No evidence for acute cervical spine fracture. Upper chest: Aortic vascular calcifications. Small bilateral pleural effusions. Other: 2 cm calcified nodule within the right thyroid lobe (image 72; series 4). IMPRESSION: No acute intracranial process. Chronic microvascular ischemic  changes. Old  left thalamic and left occipital lobe infarcts. No acute cervical spine fracture. Multilevel degenerative disc disease. Right thyroid nodule. If not previously performed, recommend further evaluation with thyroid ultrasound. Aortic vascular calcifications. Electronically Signed   By: Annia Belt M.D.   On: 05/04/2017 08:09       Subjective: No shortness of breath or palpitations  Discharge Exam: Vitals:   05/07/17 2044 05/08/17 0625  BP: 122/87 122/74  Pulse: 94 87  Resp: 20 20  Temp: 98.2 F (36.8 C) 98.3 F (36.8 C)   Vitals:   05/07/17 1300 05/07/17 1700 05/07/17 2044 05/08/17 0625  BP: 94/72  122/87 122/74  Pulse: 95  94 87  Resp: 14  20 20   Temp:  98.5 F (36.9 C) 98.2 F (36.8 C) 98.3 F (36.8 C)  TempSrc:  Oral Oral Oral  SpO2: 97%  98% 97%  Weight:    68.1 kg (150 lb 2.1 oz)  Height:        General: Pt is alert, awake, not in acute distress Cardiovascular: RRR, S1/S2 +, no rubs, no gallops Respiratory: CTA bilaterally, no wheezing, no rhonchi Abdominal: Soft, NT, ND, bowel sounds + Extremities: no edema, no cyanosis    The results of significant diagnostics from this hospitalization (including imaging, microbiology, ancillary and laboratory) are listed below for reference.     Microbiology: Recent Results (from the past 240 hour(s))  MRSA PCR Screening     Status: None   Collection Time: 05/04/17  1:00 PM  Result Value Ref Range Status   MRSA by PCR NEGATIVE NEGATIVE Final    Comment:        The GeneXpert MRSA Assay (FDA approved for NASAL specimens only), is one component of a comprehensive MRSA colonization surveillance program. It is not intended to diagnose MRSA infection nor to guide or monitor treatment for MRSA infections.      Labs: BNP (last 3 results)  Recent Labs  09/26/16 0629 10/29/16 0740 11/01/16 0655  BNP 1,570.0* 1,261.0* 1,962.0*   Basic Metabolic Panel:  Recent Labs Lab 05/04/17 0650 05/05/17 0324  05/06/17 0453 05/07/17 0406  NA 142 138 138 138  K 4.4 4.6 4.1 4.5  CL 100* 100* 99* 102  CO2 27 27 30 30   GLUCOSE 136* 77 111* 95  BUN 23* 28* 18 29*  CREATININE 6.58* 7.54* 5.38* 6.76*  CALCIUM 9.5 8.3* 8.1* 7.9*  PHOS  --  4.6 3.1 3.9   Liver Function Tests:  Recent Labs Lab 05/04/17 0650 05/05/17 0324 05/06/17 0453 05/07/17 0406  AST 19  --   --   --   ALT 20  --   --   --   ALKPHOS 60  --   --   --   BILITOT 0.8  --   --   --   PROT 6.9  --   --   --   ALBUMIN 3.9 3.2* 3.0* 2.8*   No results for input(s): LIPASE, AMYLASE in the last 168 hours. No results for input(s): AMMONIA in the last 168 hours. CBC:  Recent Labs Lab 05/04/17 0650 05/05/17 0324 05/07/17 0406  WBC 7.9 7.2 8.1  NEUTROABS 5.9  --   --   HGB 11.5* 10.1* 10.0*  HCT 36.3 32.5* 32.0*  MCV 91.0 90.3 90.4  PLT 235 216 196   Cardiac Enzymes:  Recent Labs Lab 05/04/17 0650  TROPONINI 0.03*   BNP: Invalid input(s): POCBNP CBG:  Recent Labs Lab 05/04/17 0636  GLUCAP 127*  D-Dimer No results for input(s): DDIMER in the last 72 hours. Hgb A1c No results for input(s): HGBA1C in the last 72 hours. Lipid Profile No results for input(s): CHOL, HDL, LDLCALC, TRIG, CHOLHDL, LDLDIRECT in the last 72 hours. Thyroid function studies No results for input(s): TSH, T4TOTAL, T3FREE, THYROIDAB in the last 72 hours.  Invalid input(s): FREET3 Anemia work up No results for input(s): VITAMINB12, FOLATE, FERRITIN, TIBC, IRON, RETICCTPCT in the last 72 hours. Urinalysis    Component Value Date/Time   COLORURINE AMBER (A) 05/04/2017 1053   APPEARANCEUR CLOUDY (A) 05/04/2017 1053   LABSPEC 1.015 05/04/2017 1053   PHURINE 6.0 05/04/2017 1053   GLUCOSEU NEGATIVE 05/04/2017 1053   HGBUR MODERATE (A) 05/04/2017 1053   BILIRUBINUR NEGATIVE 05/04/2017 1053   KETONESUR NEGATIVE 05/04/2017 1053   PROTEINUR >=300 (A) 05/04/2017 1053   NITRITE NEGATIVE 05/04/2017 1053   LEUKOCYTESUR LARGE (A) 05/04/2017  1053   Sepsis Labs Invalid input(s): PROCALCITONIN,  WBC,  LACTICIDVEN Microbiology Recent Results (from the past 240 hour(s))  MRSA PCR Screening     Status: None   Collection Time: 05/04/17  1:00 PM  Result Value Ref Range Status   MRSA by PCR NEGATIVE NEGATIVE Final    Comment:        The GeneXpert MRSA Assay (FDA approved for NASAL specimens only), is one component of a comprehensive MRSA colonization surveillance program. It is not intended to diagnose MRSA infection nor to guide or monitor treatment for MRSA infections.      Time coordinating discharge: Over 30 minutes  SIGNED:   Erick Blinks, MD  [facility] 05/08/2017, 2:47 PM Pager   If 7PM-7AM, please contact night-coverage [email or web address] Password [email or web address]

## 2017-05-09 ENCOUNTER — Other Ambulatory Visit: Payer: Self-pay

## 2017-05-09 DIAGNOSIS — N186 End stage renal disease: Secondary | ICD-10-CM | POA: Diagnosis not present

## 2017-05-09 DIAGNOSIS — Z992 Dependence on renal dialysis: Secondary | ICD-10-CM | POA: Diagnosis not present

## 2017-05-10 ENCOUNTER — Telehealth: Payer: Self-pay | Admitting: Vascular Surgery

## 2017-05-10 ENCOUNTER — Ambulatory Visit (HOSPITAL_COMMUNITY): Payer: Medicare Other

## 2017-05-10 ENCOUNTER — Ambulatory Visit (HOSPITAL_COMMUNITY)
Admission: RE | Admit: 2017-05-10 | Discharge: 2017-05-10 | Disposition: A | Discharge status: Home / Self Care | Payer: Medicare Other | Source: Ambulatory Visit | Attending: Vascular Surgery | Admitting: Vascular Surgery

## 2017-05-10 ENCOUNTER — Encounter (HOSPITAL_COMMUNITY)
Admission: RE | Disposition: A | Discharge status: Home / Self Care | Payer: Self-pay | Source: Ambulatory Visit | Attending: Vascular Surgery

## 2017-05-10 DIAGNOSIS — I4891 Unspecified atrial fibrillation: Secondary | ICD-10-CM | POA: Diagnosis not present

## 2017-05-10 DIAGNOSIS — E785 Hyperlipidemia, unspecified: Secondary | ICD-10-CM | POA: Insufficient documentation

## 2017-05-10 DIAGNOSIS — I132 Hypertensive heart and chronic kidney disease with heart failure and with stage 5 chronic kidney disease, or end stage renal disease: Secondary | ICD-10-CM | POA: Insufficient documentation

## 2017-05-10 DIAGNOSIS — Z9981 Dependence on supplemental oxygen: Secondary | ICD-10-CM | POA: Insufficient documentation

## 2017-05-10 DIAGNOSIS — Z452 Encounter for adjustment and management of vascular access device: Secondary | ICD-10-CM

## 2017-05-10 DIAGNOSIS — Z4901 Encounter for fitting and adjustment of extracorporeal dialysis catheter: Secondary | ICD-10-CM | POA: Diagnosis not present

## 2017-05-10 DIAGNOSIS — I509 Heart failure, unspecified: Secondary | ICD-10-CM | POA: Diagnosis not present

## 2017-05-10 DIAGNOSIS — N2581 Secondary hyperparathyroidism of renal origin: Secondary | ICD-10-CM | POA: Insufficient documentation

## 2017-05-10 DIAGNOSIS — Y832 Surgical operation with anastomosis, bypass or graft as the cause of abnormal reaction of the patient, or of later complication, without mention of misadventure at the time of the procedure: Secondary | ICD-10-CM | POA: Insufficient documentation

## 2017-05-10 DIAGNOSIS — F419 Anxiety disorder, unspecified: Secondary | ICD-10-CM | POA: Insufficient documentation

## 2017-05-10 DIAGNOSIS — E559 Vitamin D deficiency, unspecified: Secondary | ICD-10-CM | POA: Insufficient documentation

## 2017-05-10 DIAGNOSIS — K219 Gastro-esophageal reflux disease without esophagitis: Secondary | ICD-10-CM | POA: Insufficient documentation

## 2017-05-10 DIAGNOSIS — N186 End stage renal disease: Secondary | ICD-10-CM

## 2017-05-10 DIAGNOSIS — T82858A Stenosis of vascular prosthetic devices, implants and grafts, initial encounter: Secondary | ICD-10-CM | POA: Insufficient documentation

## 2017-05-10 DIAGNOSIS — Z992 Dependence on renal dialysis: Secondary | ICD-10-CM

## 2017-05-10 DIAGNOSIS — T82898A Other specified complication of vascular prosthetic devices, implants and grafts, initial encounter: Secondary | ICD-10-CM

## 2017-05-10 HISTORY — PX: A/V FISTULAGRAM: CATH118298

## 2017-05-10 LAB — POCT I-STAT, CHEM 8
BUN: 40 mg/dL — AB (ref 6–20)
CALCIUM ION: 1.15 mmol/L (ref 1.15–1.40)
Chloride: 98 mmol/L — ABNORMAL LOW (ref 101–111)
Creatinine, Ser: 7.9 mg/dL — ABNORMAL HIGH (ref 0.44–1.00)
Glucose, Bld: 87 mg/dL (ref 65–99)
HCT: 31 % — ABNORMAL LOW (ref 36.0–46.0)
Hemoglobin: 10.5 g/dL — ABNORMAL LOW (ref 12.0–15.0)
Potassium: 4.5 mmol/L (ref 3.5–5.1)
SODIUM: 139 mmol/L (ref 135–145)
TCO2: 31 mmol/L (ref 0–100)

## 2017-05-10 SURGERY — A/V FISTULAGRAM
Anesthesia: LOCAL

## 2017-05-10 MED ORDER — ONDANSETRON HCL 4 MG/2ML IJ SOLN: 4.0000 mg | Freq: Four times a day (QID) | INTRAMUSCULAR | Status: DC | PRN

## 2017-05-10 MED ORDER — HEPARIN (PORCINE) IN NACL 2-0.9 UNIT/ML-% IJ SOLN
INTRAMUSCULAR | Status: AC | PRN
  Administered 2017-05-10: 500 mL

## 2017-05-10 MED ORDER — PHENOL 1.4 % MT LIQD: 1.0000 | OROMUCOSAL | Status: DC | PRN

## 2017-05-10 MED ORDER — HEPARIN SODIUM (PORCINE) 1000 UNIT/ML IJ SOLN
INTRAMUSCULAR | Status: DC | PRN
  Administered 2017-05-10: 3.4 [IU] via INTRAVENOUS

## 2017-05-10 MED ORDER — MORPHINE SULFATE (PF) 10 MG/ML IV SOLN: 2.0000 mg | INTRAVENOUS | Status: DC | PRN

## 2017-05-10 MED ORDER — FENTANYL CITRATE (PF) 100 MCG/2ML IJ SOLN
INTRAMUSCULAR | Status: AC
  Filled 2017-05-10: qty 2

## 2017-05-10 MED ORDER — LIDOCAINE HCL (PF) 1 % IJ SOLN
INTRAMUSCULAR | Status: AC
  Filled 2017-05-10: qty 30

## 2017-05-10 MED ORDER — ACETAMINOPHEN 325 MG RE SUPP: 325.0000 mg | RECTAL | Status: DC | PRN

## 2017-05-10 MED ORDER — METOPROLOL TARTRATE 5 MG/5ML IV SOLN: 2.0000 mg | INTRAVENOUS | Status: DC | PRN

## 2017-05-10 MED ORDER — OXYCODONE-ACETAMINOPHEN 5-325 MG PO TABS: 1.0000 | ORAL_TABLET | Freq: Four times a day (QID) | ORAL | 0 refills | Status: DC | PRN

## 2017-05-10 MED ORDER — ACETAMINOPHEN 325 MG PO TABS
ORAL_TABLET | ORAL | Status: AC
  Filled 2017-05-10: qty 2

## 2017-05-10 MED ORDER — HEPARIN (PORCINE) IN NACL 2-0.9 UNIT/ML-% IJ SOLN
INTRAMUSCULAR | Status: AC
  Filled 2017-05-10: qty 1000

## 2017-05-10 MED ORDER — HEPARIN SODIUM (PORCINE) 1000 UNIT/ML IJ SOLN
INTRAMUSCULAR | Status: AC
  Filled 2017-05-10: qty 1

## 2017-05-10 MED ORDER — MIDAZOLAM HCL 2 MG/2ML IJ SOLN
INTRAMUSCULAR | Status: AC
  Filled 2017-05-10: qty 2

## 2017-05-10 MED ORDER — GUAIFENESIN-DM 100-10 MG/5ML PO SYRP: 15.0000 mL | ORAL_SOLUTION | ORAL | Status: DC | PRN

## 2017-05-10 MED ORDER — IODIXANOL 320 MG/ML IV SOLN
INTRAVENOUS | Status: DC | PRN
  Administered 2017-05-10: 5 mL via INTRAVENOUS

## 2017-05-10 MED ORDER — HYDRALAZINE HCL 20 MG/ML IJ SOLN: 5.0000 mg | INTRAMUSCULAR | Status: DC | PRN

## 2017-05-10 MED ORDER — LIDOCAINE HCL (PF) 1 % IJ SOLN
INTRAMUSCULAR | Status: DC | PRN
  Administered 2017-05-10: 30 mL

## 2017-05-10 MED ORDER — FENTANYL CITRATE (PF) 100 MCG/2ML IJ SOLN
INTRAMUSCULAR | Status: DC | PRN
  Administered 2017-05-10: 50 ug via INTRAVENOUS
  Administered 2017-05-10: 25 ug via INTRAVENOUS

## 2017-05-10 MED ORDER — ACETAMINOPHEN 325 MG PO TABS
325.0000 mg | ORAL_TABLET | ORAL | Status: DC | PRN
  Administered 2017-05-10: 650 mg via ORAL

## 2017-05-10 MED ORDER — MIDAZOLAM HCL 2 MG/2ML IJ SOLN
INTRAMUSCULAR | Status: DC | PRN
  Administered 2017-05-10: 2 mg via INTRAVENOUS

## 2017-05-10 MED ORDER — SODIUM CHLORIDE 0.9% FLUSH: 3.0000 mL | INTRAVENOUS | Status: DC | PRN

## 2017-05-10 MED ORDER — LABETALOL HCL 5 MG/ML IV SOLN: 10.0000 mg | INTRAVENOUS | Status: DC | PRN

## 2017-05-10 SURGICAL SUPPLY — 12 items
BAG SNAP BAND KOVER 36X36 (MISCELLANEOUS) ×2 IMPLANT
CATH PALINDROME RT-P 15FX23CM (CATHETERS) ×2 IMPLANT
COVER DOME SNAP 22 D (MISCELLANEOUS) ×2 IMPLANT
COVER PRB 48X5XTLSCP FOLD TPE (BAG) ×1 IMPLANT
COVER PROBE 5X48 (BAG) ×1
KIT MICROINTRODUCER STIFF 5F (SHEATH) ×4 IMPLANT
PROTECTION STATION PRESSURIZED (MISCELLANEOUS) ×2
STATION PROTECTION PRESSURIZED (MISCELLANEOUS) ×1 IMPLANT
STOPCOCK MORSE 400PSI 3WAY (MISCELLANEOUS) ×2 IMPLANT
TRAY PV CATH (CUSTOM PROCEDURE TRAY) ×2 IMPLANT
TUBING CIL FLEX 10 FLL-RA (TUBING) ×2 IMPLANT
WIRE TORQFLEX AUST .018X40CM (WIRE) ×2 IMPLANT

## 2017-05-10 NOTE — H&P (Signed)
[facility]ORT STAY H&P  CC:  Poorly functioning AV fistula   HPI: 83 y/o F with poor function of left arm AV fistula.  Past Medical History:  Diagnosis Date  . Anemia 02/11/2012  . Anxiety   . Atrial fibrillation (HCC)   . CHF (congestive heart failure) (HCC)   . Chronic kidney disease    not on dialysis yet, Tues, thurs, sat  . GERD (gastroesophageal reflux disease)   . H/O metabolic acidosis   . Hyperlipidemia   . Hyperparathyroidism, secondary (HCC)   . Hypertension    Dr. Regino Schultze, [place], [place]  . Oxygen dependent   . Vitamin D deficiency    No current facility-administered medications on file prior to encounter.    Current Outpatient Prescriptions on File Prior to Encounter  Medication Sig Dispense Refill  . acetaminophen (TYLENOL) 325 MG tablet Take 650 mg by mouth every 4 (four) hours as needed.    . ALPRAZolam (XANAX) 0.5 MG tablet Take 1 tablet by mouth 3 (three) times daily as needed.  0  . cinacalcet (SENSIPAR) 30 MG tablet Take 30 mg by mouth daily.    Marland Kitchen diltiazem (CARDIZEM CD) 360 MG 24 hr capsule Take 1 capsule (360 mg total) by mouth daily. 360 capsule 1  . metoprolol tartrate (LOPRESSOR) 25 MG tablet Take 1 tablet (25 mg total) by mouth 2 (two) times daily. 180 tablet 1  . multivitamin (RENA-VIT) TABS tablet Take 1 tablet by mouth daily.    . pantoprazole (PROTONIX) 40 MG tablet Take 1 tablet (40 mg total) by mouth 2 (two) times daily. 30 tablet 0  . promethazine (PHENERGAN) 25 MG tablet Take 25 mg by mouth every 6 (six) hours as needed for nausea or vomiting.    . sevelamer carbonate (RENVELA) 800 MG tablet Take 1,600 mg by mouth 3 (three) times daily with meals. Take 800mg  with snacks daily.       FH:  Non-Contributory  Social HX Social History  Substance Use Topics  . Smoking status: Never Smoker  . Smokeless tobacco: Never Used  . Alcohol use No    Allergies No Known Allergies  Medications Current Facility-Administered  Medications  Medication Dose Route Frequency Provider Last Rate Last Dose  . sodium chloride flush (NS) 0.9 % injection 3 mL  3 mL Intravenous PRN Sherren Kerns, MD        PHYSICAL EXAM  Vitals:   05/10/17 1106  BP: (!) 137/93  Pulse: 99  Resp: 20  Temp: 98.3 F (36.8 C)    General:  WDWN in NAD HENT: WNL Eyes: Pupils equal Pulmonary: normal non-labored breathing , without Rales, rhonchi,  wheezing Cardiac: RRR, Vascular Exam/Pulses:  No pulsatile left arm AV fistula  Extremities without ischemic changes, no Gangrene , no cellulitis; no open wounds;  Neuro A&O x 3; good sensation; motion in all extremities  Impression: Poorly functioning AVF possible occlusion  Plan: Fistulogram with possible intervention today   Fabienne Bruns @TODAY @ 12:46 PM

## 2017-05-10 NOTE — Op Note (Addendum)
Procedure: Left arm fistulogram, Ultrasound-guided insertion of Diatek catheter  Preoperative diagnosis: End-stage renal disease  Postoperative diagnosis: Same  Anesthesia: Local with IV sedation  Operative findings: 1.  Occluded left arm AV fistula 2. 23 cm Diatek catheter right internal jugular vein  Operative details: After obtaining informed consent, the patient was taken to the PV lab.  The patient's left upper extremity was prepped and draped in usual sterile fashion. Ultrasound was used to identify the left arm AV fistula. Local anesthesia was infiltrated over this. A micro-puncture needle was used to cannulate the fistula. Contrast injection was then performed through the micropuncture sheath. The fistula was occluded with only a small segment that was patent. At this point I decided to abandon the fistula.  After adequate sedation the patient's entire neck and chest were prepped and draped in usual sterile fashion. The patient was placed in Trendelenburg position. Ultrasound was used to identify the patient's right internal jugular vein. This had normal compressibility and respiratory variation. Local anesthesia was infiltrated over the right jugular vein.  Using ultrasound guidance, the right internal jugular vein was successfully cannulated.  A 0.035 J-tipped guidewire was threaded into the right internal jugular vein and into the superior vena cava followed by the inferior vena cava under fluoroscopic guidance.   Next sequential 12 and 14 dilators were placed over the guidewire into the right atrium.  A 16 French dilator with a peel-away sheath was then placed over the guidewire into the right atrium.   The guidewire and dilator were removed. A 23 cm Palindrome catheter was then placed through the peel away sheath into the right atrium.  The catheter was then tunneled subcutaneously, cut to length, and the hub attached. The catheter was noted to flush and draw easily. The catheter was  inspected under fluoroscopy and found with its tip to be in the right atrium without any kinks throughout its course. The catheter was sutured to the skin with nylon sutures. The neck insertion site was closed with Vicryl stitch. The catheter was then loaded with concentrated Heparin solution. A dry sterile dressing was applied.  The patient tolerated procedure well and there were no complications. Instrument sponge and needle counts correct in the case. The patient was taken to the recovery room in stable condition. Chest x-ray will be obtained in the recovery room.  Fabienne Bruns, MD [facility] [place] Office: [phone] Pager: [phone]

## 2017-05-10 NOTE — Telephone Encounter (Signed)
Sched appt 06/14/17; lab at 11:00 and MD at 12:00. Lm on hm#.

## 2017-05-10 NOTE — Telephone Encounter (Signed)
-----   Message from Sharee Pimple, RN sent at 05/10/2017  2:52 PM EDT ----- Regarding: 6 weeks with vein map   ----- Message ----- From: Sherren Kerns, MD Sent: 05/10/2017   2:43 PM To: [facility] Charge Pool  [place] left arm, left arm fistulogram  US neck right IJ palindrome  Pt needs follow up in a few weeks with right arm vein map to talk about new access  [employer]

## 2017-05-10 NOTE — Discharge Instructions (Signed)

## 2017-05-11 DIAGNOSIS — Z992 Dependence on renal dialysis: Secondary | ICD-10-CM | POA: Diagnosis not present

## 2017-05-11 DIAGNOSIS — N186 End stage renal disease: Secondary | ICD-10-CM | POA: Diagnosis not present

## 2017-05-13 MED FILL — Heparin Sodium (Porcine) 2 Unit/ML in Sodium Chloride 0.9%: INTRAMUSCULAR | Qty: 500 | Status: AC

## 2017-05-14 DIAGNOSIS — N186 End stage renal disease: Secondary | ICD-10-CM | POA: Diagnosis not present

## 2017-05-14 DIAGNOSIS — Z79899 Other long term (current) drug therapy: Secondary | ICD-10-CM | POA: Diagnosis not present

## 2017-05-14 DIAGNOSIS — Z992 Dependence on renal dialysis: Secondary | ICD-10-CM | POA: Diagnosis not present

## 2017-05-16 ENCOUNTER — Encounter (HOSPITAL_COMMUNITY): Payer: Self-pay | Admitting: Vascular Surgery

## 2017-05-16 DIAGNOSIS — N186 End stage renal disease: Secondary | ICD-10-CM | POA: Diagnosis not present

## 2017-05-16 DIAGNOSIS — Z992 Dependence on renal dialysis: Secondary | ICD-10-CM | POA: Diagnosis not present

## 2017-05-18 DIAGNOSIS — Z992 Dependence on renal dialysis: Secondary | ICD-10-CM | POA: Diagnosis not present

## 2017-05-18 DIAGNOSIS — N186 End stage renal disease: Secondary | ICD-10-CM | POA: Diagnosis not present

## 2017-05-21 DIAGNOSIS — Z992 Dependence on renal dialysis: Secondary | ICD-10-CM | POA: Diagnosis not present

## 2017-05-21 DIAGNOSIS — N186 End stage renal disease: Secondary | ICD-10-CM | POA: Diagnosis not present

## 2017-05-23 DIAGNOSIS — Z992 Dependence on renal dialysis: Secondary | ICD-10-CM | POA: Diagnosis not present

## 2017-05-23 DIAGNOSIS — N186 End stage renal disease: Secondary | ICD-10-CM | POA: Diagnosis not present

## 2017-05-25 DIAGNOSIS — N186 End stage renal disease: Secondary | ICD-10-CM | POA: Diagnosis not present

## 2017-05-25 DIAGNOSIS — Z992 Dependence on renal dialysis: Secondary | ICD-10-CM | POA: Diagnosis not present

## 2017-05-28 DIAGNOSIS — N186 End stage renal disease: Secondary | ICD-10-CM | POA: Diagnosis not present

## 2017-05-28 DIAGNOSIS — Z992 Dependence on renal dialysis: Secondary | ICD-10-CM | POA: Diagnosis not present

## 2017-05-28 DIAGNOSIS — J42 Unspecified chronic bronchitis: Secondary | ICD-10-CM | POA: Diagnosis not present

## 2017-05-29 DIAGNOSIS — Z992 Dependence on renal dialysis: Secondary | ICD-10-CM | POA: Diagnosis not present

## 2017-05-29 DIAGNOSIS — N186 End stage renal disease: Secondary | ICD-10-CM | POA: Diagnosis not present

## 2017-05-30 DIAGNOSIS — Z992 Dependence on renal dialysis: Secondary | ICD-10-CM | POA: Diagnosis not present

## 2017-05-30 DIAGNOSIS — N186 End stage renal disease: Secondary | ICD-10-CM | POA: Diagnosis not present

## 2017-06-01 DIAGNOSIS — Z992 Dependence on renal dialysis: Secondary | ICD-10-CM | POA: Diagnosis not present

## 2017-06-01 DIAGNOSIS — N186 End stage renal disease: Secondary | ICD-10-CM | POA: Diagnosis not present

## 2017-06-03 DIAGNOSIS — M47812 Spondylosis without myelopathy or radiculopathy, cervical region: Secondary | ICD-10-CM | POA: Diagnosis not present

## 2017-06-04 DIAGNOSIS — Z992 Dependence on renal dialysis: Secondary | ICD-10-CM | POA: Diagnosis not present

## 2017-06-04 DIAGNOSIS — N186 End stage renal disease: Secondary | ICD-10-CM | POA: Diagnosis not present

## 2017-06-05 ENCOUNTER — Encounter: Payer: Self-pay | Admitting: Vascular Surgery

## 2017-06-06 DIAGNOSIS — Z992 Dependence on renal dialysis: Secondary | ICD-10-CM | POA: Diagnosis not present

## 2017-06-06 DIAGNOSIS — N186 End stage renal disease: Secondary | ICD-10-CM | POA: Diagnosis not present

## 2017-06-07 ENCOUNTER — Other Ambulatory Visit: Payer: Self-pay

## 2017-06-07 DIAGNOSIS — T82511D Breakdown (mechanical) of surgically created arteriovenous shunt, subsequent encounter: Secondary | ICD-10-CM

## 2017-06-07 DIAGNOSIS — N186 End stage renal disease: Secondary | ICD-10-CM

## 2017-06-08 DIAGNOSIS — Z992 Dependence on renal dialysis: Secondary | ICD-10-CM | POA: Diagnosis not present

## 2017-06-08 DIAGNOSIS — N186 End stage renal disease: Secondary | ICD-10-CM | POA: Diagnosis not present

## 2017-06-10 DIAGNOSIS — N185 Chronic kidney disease, stage 5: Secondary | ICD-10-CM | POA: Diagnosis not present

## 2017-06-10 DIAGNOSIS — Z1389 Encounter for screening for other disorder: Secondary | ICD-10-CM | POA: Diagnosis not present

## 2017-06-10 DIAGNOSIS — Z0000 Encounter for general adult medical examination without abnormal findings: Secondary | ICD-10-CM | POA: Diagnosis not present

## 2017-06-10 DIAGNOSIS — N189 Chronic kidney disease, unspecified: Secondary | ICD-10-CM | POA: Diagnosis not present

## 2017-06-11 ENCOUNTER — Telehealth: Payer: Self-pay | Admitting: Cardiovascular Disease

## 2017-06-11 ENCOUNTER — Other Ambulatory Visit: Payer: Self-pay

## 2017-06-11 DIAGNOSIS — Z992 Dependence on renal dialysis: Secondary | ICD-10-CM | POA: Diagnosis not present

## 2017-06-11 DIAGNOSIS — N186 End stage renal disease: Secondary | ICD-10-CM | POA: Diagnosis not present

## 2017-06-11 MED ORDER — METOPROLOL TARTRATE 25 MG PO TABS: 25.0000 mg | ORAL_TABLET | Freq: Two times a day (BID) | ORAL | 3 refills | Status: DC

## 2017-06-11 NOTE — Telephone Encounter (Signed)
° ° °  1. Which medications need to be refilled? (please list name of each medication and dose if known)   metoprolol tartrate (LOPRESSOR) 25 MG tablet [[id number]]   2. Which pharmacy/location (including street and city if local pharmacy) is medication to be sent to? [facility]   3. Do they need a 30 day or 90 day supply?  90 day  Pt took to much of this medication, pharmacy will not give any refills

## 2017-06-11 NOTE — Progress Notes (Unsigned)
Sent in refill of lopressor 25 mg BID to [facility].

## 2017-06-13 DIAGNOSIS — Z992 Dependence on renal dialysis: Secondary | ICD-10-CM | POA: Diagnosis not present

## 2017-06-13 DIAGNOSIS — N186 End stage renal disease: Secondary | ICD-10-CM | POA: Diagnosis not present

## 2017-06-14 ENCOUNTER — Ambulatory Visit (HOSPITAL_COMMUNITY)
Admit: 2017-06-14 | Discharge: 2017-06-14 | Disposition: A | Discharge status: Home / Self Care | Payer: Medicare Other | Attending: Vascular Surgery | Admitting: Vascular Surgery

## 2017-06-14 ENCOUNTER — Ambulatory Visit (INDEPENDENT_AMBULATORY_CARE_PROVIDER_SITE_OTHER): Payer: Medicare Other | Admitting: Vascular Surgery

## 2017-06-14 ENCOUNTER — Encounter: Payer: Self-pay | Admitting: Vascular Surgery

## 2017-06-14 ENCOUNTER — Ambulatory Visit (INDEPENDENT_AMBULATORY_CARE_PROVIDER_SITE_OTHER)
Admit: 2017-06-14 | Discharge: 2017-06-14 | Disposition: A | Discharge status: Home / Self Care | Payer: Medicare Other | Attending: Vascular Surgery | Admitting: Vascular Surgery

## 2017-06-14 VITALS — BP 145/81 | HR 88 | Ht 64.0 in | Wt 145.1 lb

## 2017-06-14 DIAGNOSIS — N186 End stage renal disease: Secondary | ICD-10-CM

## 2017-06-14 DIAGNOSIS — Z992 Dependence on renal dialysis: Secondary | ICD-10-CM

## 2017-06-14 DIAGNOSIS — T82511D Breakdown (mechanical) of surgically created arteriovenous shunt, subsequent encounter: Secondary | ICD-10-CM | POA: Diagnosis not present

## 2017-06-14 NOTE — Progress Notes (Signed)
Patient ID: Patricia Ayala, female   DOB: 1928-12-25, 83 y.o.   MRN: [id number]  Reason for Consult: No chief complaint on file.   Referred by [name], MD  Subjective:     HPI:  Patricia Ayala is a 83 y.o. female end-stage renal disease and previously had a left brachiocephalic AV fistula placed. Most recently she had a left arm fistulogram and the fistula was noted to be occluded and dissect was placed in her right IJ. She is now on dialysis via the catheter on Tuesdays Thursdays and Saturdays. She does not take any blood thinners. She has never had any steal symptoms of her left upper extremity. She is right-hand dominant. She presents today with vein mapping and left upper extremity arterial duplex.  Past Medical History:  Diagnosis Date  . Anemia 02/11/2012  . Anxiety   . Atrial fibrillation (HCC)   . CHF (congestive heart failure) (HCC)   . Chronic kidney disease    not on dialysis yet, Tues, thurs, sat  . GERD (gastroesophageal reflux disease)   . H/O metabolic acidosis   . Hyperlipidemia   . Hyperparathyroidism, secondary (HCC)   . Hypertension    Dr. Regino Schultze, [place], [place]  . Oxygen dependent   . Vitamin D deficiency    Family History  Problem Relation Age of Onset  . Stroke Mother   . Cancer Father   . Heart attack Father    Past Surgical History:  Procedure Laterality Date  . A/V FISTULAGRAM N/A 05/10/2017   Procedure: A/V Fistulagram;  Surgeon: Sherren Kerns, MD;  Location: [facility] INVASIVE CV LAB;  Service: Cardiovascular;  Laterality: N/A;  . ABDOMINAL HYSTERECTOMY    . AV FISTULA PLACEMENT  04/01/2012   Procedure: ARTERIOVENOUS (AV) FISTULA CREATION;  Surgeon: Sherren Kerns, MD;  Location: [facility] OR;  Service: Vascular;  Laterality: Left;  . CHOLECYSTECTOMY    . COLON SURGERY     for a blockage  . ESOPHAGOGASTRODUODENOSCOPY N/A 02/17/2017   Procedure: ESOPHAGOGASTRODUODENOSCOPY (EGD);  Surgeon: Sherrilyn Rist, MD;  Location: [facility] ENDOSCOPY;  Service:  Endoscopy;  Laterality: N/A;  Bedside case in ICU  . EYE SURGERY     bilateral cataracts, /w IOL - 2013  . FISTULOGRAM N/A 11/05/2014   Procedure: FISTULOGRAM;  Surgeon: Larina Earthly, MD;  Location: [facility] CATH LAB;  Service: Cardiovascular;  Laterality: N/A;  . INSERTION OF DIALYSIS CATHETER  04/10/2012   Procedure: INSERTION OF DIALYSIS CATHETER;  Surgeon: Pryor Ochoa, MD;  Location: [facility] OR;  Service: Vascular;  Laterality: N/A;  Insertion Diatek Catheter Right Internal Jugular  . LIGATION OF COMPETING BRANCHES OF ARTERIOVENOUS FISTULA  11/07/2012   Procedure: LIGATION OF COMPETING BRANCHES OF ARTERIOVENOUS FISTULA;  Surgeon: Chuck Hint, MD;  Location: [facility] OR;  Service: Vascular;  Laterality: Left;  Brachio/Cephalic Fistula  . SHUNTOGRAM N/A 11/03/2012   Procedure: Betsey Amen;  Surgeon: Chuck Hint, MD;  Location: [facility] CATH LAB;  Service: Cardiovascular;  Laterality: N/A;  . SHUNTOGRAM Left 03/09/2013   Procedure: Fistulogram;  Surgeon: Fransisco Hertz, MD;  Location: [facility] CATH LAB;  Service: Cardiovascular;  Laterality: Left;  . SHUNTOGRAM Left 11/23/2013   Procedure: FISTULOGRAM;  Surgeon: Fransisco Hertz, MD;  Location: [facility] CATH LAB;  Service: Cardiovascular;  Laterality: Left;    Short Social History:  Social History  Substance Use Topics  . Smoking status: Never Smoker  . Smokeless tobacco: Never Used  . Alcohol use No    No  Known Allergies  Current Outpatient Prescriptions  Medication Sig Dispense Refill  . acetaminophen (TYLENOL) 325 MG tablet Take 650 mg by mouth every 4 (four) hours as needed.    . ALPRAZolam (XANAX) 0.5 MG tablet Take 1 tablet by mouth 3 (three) times daily as needed.  0  . cinacalcet (SENSIPAR) 30 MG tablet Take 30 mg by mouth daily.    Marland Kitchen diltiazem (CARDIZEM CD) 360 MG 24 hr capsule Take 1 capsule (360 mg total) by mouth daily. 360 capsule 1  . metoprolol tartrate (LOPRESSOR) 25 MG tablet Take 1 tablet (25 mg total) by mouth 2 (two) times daily. 180  tablet 3  . multivitamin (RENA-VIT) TABS tablet Take 1 tablet by mouth daily.    . pantoprazole (PROTONIX) 40 MG tablet Take 1 tablet (40 mg total) by mouth 2 (two) times daily. 30 tablet 0  . promethazine (PHENERGAN) 25 MG tablet Take 25 mg by mouth every 6 (six) hours as needed for nausea or vomiting.    . sevelamer carbonate (RENVELA) 800 MG tablet Take 1,600 mg by mouth 3 (three) times daily with meals. Take 800mg  with snacks daily.      No current facility-administered medications for this visit.     Review of Systems  Constitutional:  Constitutional negative. HENT: HENT negative.  Eyes: Eyes negative.  Respiratory: Positive for shortness of breath.  Cardiovascular: Positive for dyspnea with exertion, irregular heartbeat and leg swelling.  GI: Positive for blood in stool.  Musculoskeletal: Musculoskeletal negative.  Skin: Skin negative.  Neurological: Positive for focal weakness.  Hematologic: Hematologic/lymphatic negative.  Psychiatric: Psychiatric negative.        Objective:  Objective   Vitals:   06/14/17 1201  BP: (!) 145/81  Pulse: 88  Weight: 145 lb 1.6 oz (65.8 kg)  Height: 5\' 4"  (1.626 m)   Body mass index is 24.91 kg/m.  Physical Exam  Constitutional: She is oriented to person, place, and time. She appears well-developed.  Eyes: Pupils are equal, round, and reactive to light.  Cardiovascular: Normal rate.   Pulses:      Radial pulses are 2+ on the right side, and 2+ on the left side.  Pulmonary/Chest: Effort normal.  Musculoskeletal: Normal range of motion. She exhibits no edema.  Neurological: She is alert and oriented to person, place, and time.  Skin: Skin is warm and dry.  Psychiatric: She has a normal mood and affect. Her behavior is normal. Judgment and thought content normal.    Data: I have independently interpreted her left lower [place] arterial duplex with triphasic waveform and diameter 0.4 cm. On the right she has a triphasic waveform with  0.6 cm.  Vein mapping demonstrates usable basilic vein in her left upper arm.    Assessment/Plan:     83 year old female here for evaluation of new dialysis access. She appears to have a basilic vein on the left arm that would be usable for AV fistula creation. We discussed the risk benefits alternatives to proceeding. We will evaluate the basilic vein intraoperatively and plan for a two-stage procedure. Should the vein not be usable at the time we will place a left upper extremity AV graft. She is accompanied by her husband today and demonstrate good understanding and we will get her scheduled on a nondialysis day in the near future.     Maeola Harman MD [facility]

## 2017-06-15 DIAGNOSIS — N186 End stage renal disease: Secondary | ICD-10-CM | POA: Diagnosis not present

## 2017-06-15 DIAGNOSIS — Z992 Dependence on renal dialysis: Secondary | ICD-10-CM | POA: Diagnosis not present

## 2017-06-17 DIAGNOSIS — M47812 Spondylosis without myelopathy or radiculopathy, cervical region: Secondary | ICD-10-CM | POA: Diagnosis not present

## 2017-06-17 DIAGNOSIS — M79675 Pain in left toe(s): Secondary | ICD-10-CM | POA: Diagnosis not present

## 2017-06-18 DIAGNOSIS — N186 End stage renal disease: Secondary | ICD-10-CM | POA: Diagnosis not present

## 2017-06-18 DIAGNOSIS — Z992 Dependence on renal dialysis: Secondary | ICD-10-CM | POA: Diagnosis not present

## 2017-06-20 ENCOUNTER — Other Ambulatory Visit: Payer: Self-pay | Admitting: *Deleted

## 2017-06-20 DIAGNOSIS — N186 End stage renal disease: Secondary | ICD-10-CM | POA: Diagnosis not present

## 2017-06-20 DIAGNOSIS — Z992 Dependence on renal dialysis: Secondary | ICD-10-CM | POA: Diagnosis not present

## 2017-06-21 ENCOUNTER — Encounter (HOSPITAL_COMMUNITY): Payer: Self-pay | Admitting: *Deleted

## 2017-06-21 NOTE — Progress Notes (Signed)
PCP: Dr. Sherwood Gambler in [facility] in [place]  Cardiologist: Dr. Purvis Sheffield in [place]

## 2017-06-21 NOTE — Progress Notes (Signed)
Anesthesia Chart Review: SAME DAY WORK-UP.  Patient is a 83 year old female scheduled for left arm AVF creation, possible AVGG insertion on 06/24/17 by Dr. Lemar Livings.  History includes never smoker, end-stage renal disease (dialysis TTS via catheter), atrial fibrillation, hypertension, GERD, hyperlipidemia, CHF, mild AS (10/2016 echo), oxygen dependent, PVD (left SFA/popliteal artery occlusion by U/S 09/2016), exertional dyspnea, anemia, hysterectomy, cholecystectomy, colon surgery, chronic non-occlusive left popliteal/femoral DVT (11/06/16 U/S). - Admitted 05/04/17-05/08/17 for acute delirium, felt related to narcotics. She also had afib with RVR (known afib history). She was treated with diltiazem and metoprolol. She is not a candidate for anticoagulation due to major GI bleed.  - Admitted 02/16/17-02/22/17 for GI bleed. HGB 7.2 and 4.1. Had been on warfarin and given vitamin K and DDAVP. She was intubated and had an EGD showing gastric ulcer with visible vessel bleeding s/p clipping/injection/cautery. She was transfused with 5 units PRBC. Warfarin discontined. H. Pylori serologies and biopsy were equivocal, so she was started on treatment. Extubated 02/19/17. Discharged to SNF.  - PCP is Dr. Sherwood Gambler at [facility] in [place].  - Cardiologist is Dr. Purvis Sheffield at [facility]. Last visit 02/27/17. Six month follow-up planned.   Meds include Xanax, Sensipar, diltiazem, Lopressor, Rena-Vit, Protonix, promethazine, Renvela.  EKG 05/04/17 (during ED visit/hospitalization for AMS): Afib with RVR at 157 bpm, LAD, low voltage QRS, abnormal R wave progression, late transition. Non-specific lateral T wave abnormality. Baseline wanderer, particularly in limb leads. Since 02/17/17, rate is faster. (HR on 06/14/17 was 88 bpm.)  Echo 11/09/16: Study Conclusions - Left ventricle: The cavity size was normal. Systolic function was   normal. The estimated ejection fraction was 55%. Wall motion  was   normal; there were no regional wall motion abnormalities. - Aortic valve: Trileaflet; mildly thickened, mildly calcified   leaflets. Morphologically, there appears to be mild aortic   stenosis. - Mitral valve: Severely calcified annulus. Mildly thickened   leaflets . - Left atrium: The atrium was severely dilated. - Right atrium: The atrium was mildly dilated. - Pericardium, extracardiac: Small to moderate size pericardial   effusion. No evidence of tamponade physiology.  BLE venous U/S 11/06/16: IMPRESSION: 1. No evidence of right lower extremity deep vein thrombosis. 2. Probable chronic nonocclusive DVT in the left popliteal and femoral veins. No evidence of superimposed acute DVT.  Patient with ESRD on HD. She need new permanent HD access. Known chronic afib, off anticoagulants due to major GI bleed earlier this year. Last EKG showed rapid afib, but HR at VVS was 88 bpm. She will get vitals and an ISTAT4 on the day of surgery. If tachycardic then consider repeating EKG, otherwise if afib rate controlled, labs acceptable, and otherwise no acute changes then I would anticipate that she could proceed as planned.  Velna Ochs [facility] Short Stay Center/Anesthesiology Phone [phone] 06/21/2017 3:20 PM

## 2017-06-22 DIAGNOSIS — N186 End stage renal disease: Secondary | ICD-10-CM | POA: Diagnosis not present

## 2017-06-22 DIAGNOSIS — Z992 Dependence on renal dialysis: Secondary | ICD-10-CM | POA: Diagnosis not present

## 2017-06-24 ENCOUNTER — Encounter (HOSPITAL_COMMUNITY): Payer: Self-pay | Admitting: *Deleted

## 2017-06-24 ENCOUNTER — Ambulatory Visit (HOSPITAL_COMMUNITY): Payer: Medicare Other | Admitting: Vascular Surgery

## 2017-06-24 ENCOUNTER — Encounter (HOSPITAL_COMMUNITY)
Admission: RE | Disposition: A | Discharge status: Home / Self Care | Payer: Self-pay | Source: Ambulatory Visit | Attending: Vascular Surgery

## 2017-06-24 ENCOUNTER — Ambulatory Visit (HOSPITAL_COMMUNITY)
Admission: RE | Admit: 2017-06-24 | Discharge: 2017-06-24 | Disposition: A | Discharge status: Home / Self Care | Payer: Medicare Other | Source: Ambulatory Visit | Attending: Vascular Surgery | Admitting: Vascular Surgery

## 2017-06-24 DIAGNOSIS — N185 Chronic kidney disease, stage 5: Secondary | ICD-10-CM | POA: Diagnosis not present

## 2017-06-24 DIAGNOSIS — I132 Hypertensive heart and chronic kidney disease with heart failure and with stage 5 chronic kidney disease, or end stage renal disease: Secondary | ICD-10-CM | POA: Insufficient documentation

## 2017-06-24 DIAGNOSIS — E785 Hyperlipidemia, unspecified: Secondary | ICD-10-CM | POA: Diagnosis not present

## 2017-06-24 DIAGNOSIS — I482 Chronic atrial fibrillation: Secondary | ICD-10-CM | POA: Diagnosis not present

## 2017-06-24 DIAGNOSIS — I120 Hypertensive chronic kidney disease with stage 5 chronic kidney disease or end stage renal disease: Secondary | ICD-10-CM | POA: Diagnosis not present

## 2017-06-24 DIAGNOSIS — I4891 Unspecified atrial fibrillation: Secondary | ICD-10-CM | POA: Diagnosis not present

## 2017-06-24 DIAGNOSIS — Z9981 Dependence on supplemental oxygen: Secondary | ICD-10-CM | POA: Diagnosis not present

## 2017-06-24 DIAGNOSIS — I509 Heart failure, unspecified: Secondary | ICD-10-CM | POA: Diagnosis not present

## 2017-06-24 DIAGNOSIS — Z992 Dependence on renal dialysis: Secondary | ICD-10-CM | POA: Insufficient documentation

## 2017-06-24 DIAGNOSIS — I739 Peripheral vascular disease, unspecified: Secondary | ICD-10-CM | POA: Diagnosis not present

## 2017-06-24 DIAGNOSIS — N186 End stage renal disease: Secondary | ICD-10-CM | POA: Diagnosis not present

## 2017-06-24 HISTORY — DX: Unspecified osteoarthritis, unspecified site: M19.90

## 2017-06-24 HISTORY — DX: Headache, unspecified: R51.9

## 2017-06-24 HISTORY — PX: AV FISTULA PLACEMENT: SHX1204

## 2017-06-24 HISTORY — DX: Peripheral vascular disease, unspecified: I73.9

## 2017-06-24 HISTORY — DX: Nonrheumatic aortic (valve) stenosis: I35.0

## 2017-06-24 HISTORY — DX: Headache: R51

## 2017-06-24 HISTORY — DX: Peptic ulcer, site unspecified, unspecified as acute or chronic, without hemorrhage or perforation: K27.9

## 2017-06-24 HISTORY — DX: Dyspnea, unspecified: R06.00

## 2017-06-24 LAB — POCT I-STAT 4, (NA,K, GLUC, HGB,HCT)
GLUCOSE: 99 mg/dL (ref 65–99)
HCT: 38 % (ref 36.0–46.0)
HEMOGLOBIN: 12.9 g/dL (ref 12.0–15.0)
POTASSIUM: 5.5 mmol/L — AB (ref 3.5–5.1)
Sodium: 136 mmol/L (ref 135–145)

## 2017-06-24 SURGERY — ARTERIOVENOUS (AV) FISTULA CREATION
Anesthesia: Monitor Anesthesia Care | Laterality: Left

## 2017-06-24 MED ORDER — OXYCODONE-ACETAMINOPHEN 5-325 MG PO TABS: 1.0000 | ORAL_TABLET | Freq: Four times a day (QID) | ORAL | 0 refills | Status: DC | PRN

## 2017-06-24 MED ORDER — CHLORHEXIDINE GLUCONATE 4 % EX LIQD: 60.0000 mL | Freq: Once | CUTANEOUS | Status: DC

## 2017-06-24 MED ORDER — LIDOCAINE-EPINEPHRINE (PF) 1 %-1:200000 IJ SOLN
INTRAMUSCULAR | Status: AC
  Filled 2017-06-24: qty 30

## 2017-06-24 MED ORDER — ONDANSETRON HCL 4 MG/2ML IJ SOLN
INTRAMUSCULAR | Status: AC
  Filled 2017-06-24: qty 2

## 2017-06-24 MED ORDER — ONDANSETRON HCL 4 MG/2ML IJ SOLN
INTRAMUSCULAR | Status: DC | PRN
  Administered 2017-06-24: 4 mg via INTRAVENOUS

## 2017-06-24 MED ORDER — PHENYLEPHRINE 40 MCG/ML (10ML) SYRINGE FOR IV PUSH (FOR BLOOD PRESSURE SUPPORT)
PREFILLED_SYRINGE | INTRAVENOUS | Status: AC
  Filled 2017-06-24: qty 10

## 2017-06-24 MED ORDER — FENTANYL CITRATE (PF) 100 MCG/2ML IJ SOLN
INTRAMUSCULAR | Status: DC | PRN
  Administered 2017-06-24: 25 ug via INTRAVENOUS

## 2017-06-24 MED ORDER — PROPOFOL 10 MG/ML IV BOLUS
INTRAVENOUS | Status: AC
  Filled 2017-06-24: qty 20

## 2017-06-24 MED ORDER — FENTANYL CITRATE (PF) 250 MCG/5ML IJ SOLN
INTRAMUSCULAR | Status: AC
Start: 2017-06-24 — End: 2017-06-24
  Filled 2017-06-24: qty 5

## 2017-06-24 MED ORDER — DEXTROSE 5 % IV SOLN
1.5000 g | INTRAVENOUS | Status: AC
  Administered 2017-06-24: 1.5 g via INTRAVENOUS

## 2017-06-24 MED ORDER — LIDOCAINE-EPINEPHRINE (PF) 1 %-1:200000 IJ SOLN
INTRAMUSCULAR | Status: DC | PRN
Start: 2017-06-24 — End: 2017-06-24
  Administered 2017-06-24: 15 mL

## 2017-06-24 MED ORDER — LIDOCAINE HCL (CARDIAC) 20 MG/ML IV SOLN
INTRAVENOUS | Status: DC | PRN
  Administered 2017-06-24: 60 mg via INTRATRACHEAL

## 2017-06-24 MED ORDER — PROPOFOL 500 MG/50ML IV EMUL
INTRAVENOUS | Status: DC | PRN
  Administered 2017-06-24: 50 ug/kg/min via INTRAVENOUS

## 2017-06-24 MED ORDER — SODIUM CHLORIDE 0.9 % IV SOLN
INTRAVENOUS | Status: DC
  Administered 2017-06-24: 15:00:00 via INTRAVENOUS

## 2017-06-24 MED ORDER — DEXTROSE 5 % IV SOLN
INTRAVENOUS | Status: AC
  Filled 2017-06-24: qty 1.5

## 2017-06-24 MED ORDER — PROPOFOL 10 MG/ML IV BOLUS
INTRAVENOUS | Status: DC | PRN
  Administered 2017-06-24: 10 mg via INTRAVENOUS

## 2017-06-24 MED ORDER — SODIUM CHLORIDE 0.9 % IV SOLN
INTRAVENOUS | Status: DC | PRN
  Administered 2017-06-24: 500 mL

## 2017-06-24 MED ORDER — 0.9 % SODIUM CHLORIDE (POUR BTL) OPTIME
TOPICAL | Status: DC | PRN
  Administered 2017-06-24: 1000 mL

## 2017-06-24 SURGICAL SUPPLY — 43 items
ARMBAND PINK RESTRICT EXTREMIT (MISCELLANEOUS) ×3 IMPLANT
BAG DECANTER FOR FLEXI CONT (MISCELLANEOUS) ×3 IMPLANT
CANISTER SUCT 3000ML PPV (MISCELLANEOUS) ×3 IMPLANT
CLIP VESOCCLUDE MED 6/CT (CLIP) ×3 IMPLANT
CLIP VESOCCLUDE SM WIDE 6/CT (CLIP) ×3 IMPLANT
COVER PROBE W GEL 5X96 (DRAPES) ×3 IMPLANT
DERMABOND ADVANCED (GAUZE/BANDAGES/DRESSINGS) ×2
DERMABOND ADVANCED .7 DNX12 (GAUZE/BANDAGES/DRESSINGS) ×1 IMPLANT
ELECT REM PT RETURN 9FT ADLT (ELECTROSURGICAL) ×3
ELECTRODE REM PT RTRN 9FT ADLT (ELECTROSURGICAL) ×1 IMPLANT
GLOVE BIO SURGEON STRL SZ 6.5 (GLOVE) ×2 IMPLANT
GLOVE BIO SURGEON STRL SZ7.5 (GLOVE) ×3 IMPLANT
GLOVE BIO SURGEONS STRL SZ 6.5 (GLOVE) ×1
GLOVE BIOGEL PI IND STRL 6.5 (GLOVE) ×1 IMPLANT
GLOVE BIOGEL PI IND STRL 7.0 (GLOVE) ×1 IMPLANT
GLOVE BIOGEL PI INDICATOR 6.5 (GLOVE) ×2
GLOVE BIOGEL PI INDICATOR 7.0 (GLOVE) ×2
GLOVE SURG SS PI 7.0 STRL IVOR (GLOVE) ×3 IMPLANT
GOWN STRL REUS W/ TWL LRG LVL3 (GOWN DISPOSABLE) ×1 IMPLANT
GOWN STRL REUS W/ TWL XL LVL3 (GOWN DISPOSABLE) ×2 IMPLANT
GOWN STRL REUS W/TWL LRG LVL3 (GOWN DISPOSABLE) ×2
GOWN STRL REUS W/TWL XL LVL3 (GOWN DISPOSABLE) ×4
HEMOSTAT SNOW SURGICEL 2X4 (HEMOSTASIS) IMPLANT
INSERT FOGARTY SM (MISCELLANEOUS) IMPLANT
KIT BASIN OR (CUSTOM PROCEDURE TRAY) ×3 IMPLANT
KIT ROOM TURNOVER OR (KITS) ×3 IMPLANT
NEEDLE HYPO 25GX1X1/2 BEV (NEEDLE) ×3 IMPLANT
NS IRRIG 1000ML POUR BTL (IV SOLUTION) ×3 IMPLANT
PACK CV ACCESS (CUSTOM PROCEDURE TRAY) ×3 IMPLANT
PAD ARMBOARD 7.5X6 YLW CONV (MISCELLANEOUS) ×6 IMPLANT
SLEEVE SURGEON STRL (DRAPES) ×3 IMPLANT
SUT GORETEX 6.0 TH-9 30 IN (SUTURE) IMPLANT
SUT GORETEX CV-6TTC-13 36IN (SUTURE) IMPLANT
SUT MNCRL AB 4-0 PS2 18 (SUTURE) ×3 IMPLANT
SUT PROLENE 6 0 BV (SUTURE) ×3 IMPLANT
SUT SILK 2 0 SH (SUTURE) IMPLANT
SUT SILK 3 0 (SUTURE) ×2
SUT SILK 3-0 18XBRD TIE 12 (SUTURE) ×1 IMPLANT
SUT VIC AB 3-0 SH 27 (SUTURE) ×4
SUT VIC AB 3-0 SH 27X BRD (SUTURE) ×2 IMPLANT
SYR TOOMEY 50ML (SYRINGE) IMPLANT
UNDERPAD 30X30 (UNDERPADS AND DIAPERS) ×3 IMPLANT
WATER STERILE IRR 1000ML POUR (IV SOLUTION) ×3 IMPLANT

## 2017-06-24 NOTE — Anesthesia Preprocedure Evaluation (Signed)
Anesthesia Evaluation  Patient identified by MRN, date of birth, ID band Patient awake    Reviewed: Allergy & Precautions, NPO status , Patient's Chart, lab work & pertinent test results  Airway Mallampati: II  TM Distance: >3 FB Neck ROM: Full    Dental no notable dental hx. (+) Missing   Pulmonary neg pulmonary ROS,    Pulmonary exam normal breath sounds clear to auscultation       Cardiovascular hypertension, +CHF  Normal cardiovascular exam+ dysrhythmias Atrial Fibrillation  Rhythm:Regular Rate:Normal  Mild AS. EF %55   Neuro/Psych negative neurological ROS  negative psych ROS   GI/Hepatic negative GI ROS, Neg liver ROS,   Endo/Other  negative endocrine ROS  Renal/GU ESRF and DialysisRenal disease  negative genitourinary   Musculoskeletal negative musculoskeletal ROS (+)   Abdominal   Peds negative pediatric ROS (+)  Hematology negative hematology ROS (+)   Anesthesia Other Findings   Reproductive/Obstetrics negative OB ROS                            Anesthesia Physical Anesthesia Plan  ASA: IV  Anesthesia Plan: MAC   Post-op Pain Management:    Induction:   PONV Risk Score and Plan: 2 and Ondansetron and Treatment may vary due to age or medical condition  Airway Management Planned: Simple Face Mask  Additional Equipment:   Intra-op Plan:   Post-operative Plan:   Informed Consent: I have reviewed the patients History and Physical, chart, labs and discussed the procedure including the risks, benefits and alternatives for the proposed anesthesia with the patient or authorized representative who has indicated his/her understanding and acceptance.   Dental advisory given  Plan Discussed with:   Anesthesia Plan Comments:         Anesthesia Quick Evaluation

## 2017-06-24 NOTE — Progress Notes (Signed)
Report to Dr. Acey Lav, Ekg done & on pt.s' chart.

## 2017-06-24 NOTE — Discharge Instructions (Signed)
° °  [facility]  Discharge Instructions  AV Fistula or Graft Surgery for Dialysis Access  Please refer to the following instructions for your post-procedure care. Your surgeon or physician assistant will discuss any changes with you.  Activity  You may drive the day following your surgery, if you are comfortable and no longer taking prescription pain medication. Resume full activity as the soreness in your incision resolves.  Bathing/Showering  You may shower after you go home. Keep your incision dry for 48 hours. Do not soak in a bathtub, hot tub, or swim until the incision heals completely. You may not shower if you have a hemodialysis catheter.  Incision Care  Clean your incision with mild soap and water after 48 hours. Pat the area dry with a clean towel. You do not need a bandage unless otherwise instructed. Do not apply any ointments or creams to your incision. You may have skin glue on your incision. Do not peel it off. It will come off on its own in about one week. Your arm may swell a bit after surgery. To reduce swelling use pillows to elevate your arm so it is above your heart. Your doctor will tell you if you need to lightly wrap your arm with an ACE bandage.  Diet  Resume your normal diet. There are not special food restrictions following this procedure. In order to heal from your surgery, it is CRITICAL to get adequate nutrition. Your body requires vitamins, minerals, and protein. Vegetables are the best source of vitamins and minerals. Vegetables also provide the perfect balance of protein. Processed food has little nutritional value, so try to avoid this.  Medications  Resume taking all of your medications. If your incision is causing pain, you may take over-the counter pain relievers such as acetaminophen (Tylenol). If you were prescribed a stronger pain medication, please be aware these medications can cause nausea and constipation. Prevent  nausea by taking the medication with a snack or meal. Avoid constipation by drinking plenty of fluids and eating foods with high amount of fiber, such as fruits, vegetables, and grains. Do not take Tylenol if you are taking prescription pain medications.  Follow up Your surgeon may want to see you in the office following your access surgery. If so, this will be arranged at the time of your surgery.  Please call us immediately for any of the following conditions:  Increased pain, redness, drainage (pus) from your incision site Fever of 101 degrees or higher Severe or worsening pain at your incision site Hand pain or numbness.  Reduce your risk of vascular disease:  Stop smoking. If you would like help, call [facility] at 1-800-QUIT-NOW (276-623-4980) or [place] at (531)168-7163  Manage your cholesterol Maintain a desired weight Control your diabetes Keep your blood pressure down  Dialysis  It will take several weeks to several months for your new dialysis access to be ready for use. Your surgeon will determine when it is OK to use it. Your nephrologist will continue to direct your dialysis. You can continue to use your Permcath until your new access is ready for use.   06/24/2017 Patricia Ayala [id number] [birth year]  Surgeon(s): Maeola Harman, MD  Procedure(s): ARTERIOVENOUS (AV) FISTULA CREATION  x Do not stick graft for 12 weeks    If you have any questions, please call the office at [phone].

## 2017-06-24 NOTE — Op Note (Signed)
    Patient name: Patricia Ayala MRN: [id number] DOB: 02-Jan-1929 Sex: female  06/24/2017 Pre-operative Diagnosis: esrd Post-operative diagnosis:  Same Surgeon:  Apolinar Junes C. Randie Heinz, MD Assistant: OR nursing Procedure Performed: Left first stage basilic vein av fistula  Indications:  83yo  female on dialysis via catheter at this time. She has a previous history of a left brachiocephalic AV fistula that has thrombosed. She is now indicated for left first facilitating fistula versus graft.   Findings: There appears to be aberrant arterial anatomy on the left. So again suitable for dislocation with dilation to 3 mm. The superficial artery use had an extra diameter of 5 mm and strong pulsation without disease. It was somewhat thin walled doing some believe that this could have been an AV fistula itself I could not confirm this with ultrasound seems to be aberrant arterial anatomy. I completion there is compatible thrown the vein as well as strong signal of the radial artery at the wrist.   Procedure:  The patient was identified in the holding area and taken to the operating room where she was placed supine on the operating table in back anesthesia induced. She did present in the left arm were accessed procedure given antibiotics and called. We began with ultrasound evaluation of the left upper extremity which demonstrated a patent compressible and suitable facilitating. I will do appear to be aberrant arterial anatomy both arteries appearing free of disease. We then made a transverse incision between the most superficial artery and vein. The pain was first traced and marked for orientation divided distally. Denies having dilated to 3 mm. The artery was then dissected out vessel loop placed around. It was then clamped distally and proximally opened longitudinally flushed with heparin saline both directions. The vein was then spatulated 70 psi was a separately suture. Prior to completing the anastomosis. Flush  maneuvers in all directions. Thank betamethasone Throughout the vein. This was confirmed with Doppler as well as a radial artery signal also chondroma Doppler that augmented with compression of the fistula. Satisfied with this we obtained hemostasis and wound closed in 2 layers with 3-0 Vicryl or Monocryl. The rod was placed on that. Patient tolerated procedure well without immediate competition. All counts were correct at completion.     Wenonah Milo C. Randie Heinz, MD [facility] [place] Office: [phone] Pager: [phone]

## 2017-06-24 NOTE — H&P (Signed)
   History and Physical Update  The patient was interviewed and re-examined.  The patient's previous History and Physical has been reviewed and is unchanged from office visit. Left upper arm 1st stage bvt vs avg.   Treyshaun Keatts C. Randie Heinz, MD [facility] [place] Office: [phone] Pager: [phone]   06/24/2017, 2:19 PM

## 2017-06-24 NOTE — Progress Notes (Signed)
Call to Dr. Acey Lav, reported thought from a previous anesth. Review that pt. May need an ekg repeat today. He is in agreement, will repeat today.

## 2017-06-24 NOTE — Transfer of Care (Signed)
Immediate Anesthesia Transfer of Care Note  Patient: Patricia Ayala  Procedure(s) Performed: Procedure(s): ARTERIOVENOUS (AV) FISTULA CREATION (Left)  Patient Location: PACU  Anesthesia Type:MAC  Level of Consciousness: awake, alert  and oriented  Airway & Oxygen Therapy: Patient Spontanous Breathing  Post-op Assessment: Report given to RN, Post -op Vital signs reviewed and stable and Patient moving all extremities X 4  Post vital signs: Reviewed and stable  Last Vitals:  Vitals:   06/24/17 0849  BP: (!) 161/96  Pulse: (!) 109  Resp: (!) 100  Temp: 36.7 C  SpO2: (!) 18%    Last Pain:  Vitals:   06/24/17 0929  TempSrc:   PainSc: 9       Patients Stated Pain Goal: 9 (08/05/11 1975)  Complications: No apparent anesthesia complications

## 2017-06-25 ENCOUNTER — Encounter (HOSPITAL_COMMUNITY): Payer: Self-pay | Admitting: Vascular Surgery

## 2017-06-25 DIAGNOSIS — Z992 Dependence on renal dialysis: Secondary | ICD-10-CM | POA: Diagnosis not present

## 2017-06-25 DIAGNOSIS — N186 End stage renal disease: Secondary | ICD-10-CM | POA: Diagnosis not present

## 2017-06-25 NOTE — Anesthesia Postprocedure Evaluation (Signed)
Anesthesia Post Note  Patient: Patricia Ayala  Procedure(s) Performed: Procedure(s) (LRB): ARTERIOVENOUS (AV) FISTULA CREATION (Left)     Patient location during evaluation: PACU Anesthesia Type: MAC Level of consciousness: awake and alert Pain management: pain level controlled Vital Signs Assessment: post-procedure vital signs reviewed and stable Respiratory status: spontaneous breathing, nonlabored ventilation, respiratory function stable and patient connected to nasal cannula oxygen Cardiovascular status: stable and blood pressure returned to baseline Anesthetic complications: no    Last Vitals:  Vitals:   06/24/17 1630 06/24/17 1645  BP: 117/70 114/67  Pulse: 73 (!) 55  Resp: 10 14  Temp:  36.7 C  SpO2: 98% 97%    Last Pain:  Vitals:   06/24/17 1645  TempSrc:   PainSc: 0-No pain   Pain Goal: Patients Stated Pain Goal: 9 (06/24/17 0929)               Montez Hageman

## 2017-06-27 ENCOUNTER — Telehealth: Payer: Self-pay | Admitting: Vascular Surgery

## 2017-06-27 DIAGNOSIS — N186 End stage renal disease: Secondary | ICD-10-CM | POA: Diagnosis not present

## 2017-06-27 DIAGNOSIS — Z992 Dependence on renal dialysis: Secondary | ICD-10-CM | POA: Diagnosis not present

## 2017-06-27 NOTE — Telephone Encounter (Signed)
Sched appt 08/09/17; lab at 1:30 and MD at 2:15. Lm on hm#.

## 2017-06-27 NOTE — Telephone Encounter (Signed)
-----   Message from Sharee Pimple, RN sent at 06/24/2017  5:02 PM EDT ----- Regarding: 4-6 weeks w/ duplex   ----- Message ----- From: Otis Peak Sent: 06/24/2017   4:14 PM To: Vvs Charge Pool  S/p left 1st stage BVT 06/24/17  F/u with Dr. Randie Heinz in 4-6 weeks with duplex  Thanks Selena Batten

## 2017-06-28 DIAGNOSIS — J42 Unspecified chronic bronchitis: Secondary | ICD-10-CM | POA: Diagnosis not present

## 2017-06-28 DIAGNOSIS — N186 End stage renal disease: Secondary | ICD-10-CM | POA: Diagnosis not present

## 2017-06-28 DIAGNOSIS — Z992 Dependence on renal dialysis: Secondary | ICD-10-CM | POA: Diagnosis not present

## 2017-06-29 DIAGNOSIS — N186 End stage renal disease: Secondary | ICD-10-CM | POA: Diagnosis not present

## 2017-06-29 DIAGNOSIS — Z992 Dependence on renal dialysis: Secondary | ICD-10-CM | POA: Diagnosis not present

## 2017-07-02 DIAGNOSIS — Z992 Dependence on renal dialysis: Secondary | ICD-10-CM | POA: Diagnosis not present

## 2017-07-02 DIAGNOSIS — N186 End stage renal disease: Secondary | ICD-10-CM | POA: Diagnosis not present

## 2017-07-03 DIAGNOSIS — M47812 Spondylosis without myelopathy or radiculopathy, cervical region: Secondary | ICD-10-CM | POA: Diagnosis not present

## 2017-07-04 DIAGNOSIS — N186 End stage renal disease: Secondary | ICD-10-CM | POA: Diagnosis not present

## 2017-07-04 DIAGNOSIS — Z992 Dependence on renal dialysis: Secondary | ICD-10-CM | POA: Diagnosis not present

## 2017-07-06 DIAGNOSIS — N186 End stage renal disease: Secondary | ICD-10-CM | POA: Diagnosis not present

## 2017-07-06 DIAGNOSIS — Z992 Dependence on renal dialysis: Secondary | ICD-10-CM | POA: Diagnosis not present

## 2017-07-09 DIAGNOSIS — Z992 Dependence on renal dialysis: Secondary | ICD-10-CM | POA: Diagnosis not present

## 2017-07-09 DIAGNOSIS — N186 End stage renal disease: Secondary | ICD-10-CM | POA: Diagnosis not present

## 2017-07-10 DIAGNOSIS — G894 Chronic pain syndrome: Secondary | ICD-10-CM | POA: Diagnosis not present

## 2017-07-10 DIAGNOSIS — M1991 Primary osteoarthritis, unspecified site: Secondary | ICD-10-CM | POA: Diagnosis not present

## 2017-07-11 DIAGNOSIS — N186 End stage renal disease: Secondary | ICD-10-CM | POA: Diagnosis not present

## 2017-07-11 DIAGNOSIS — Z992 Dependence on renal dialysis: Secondary | ICD-10-CM | POA: Diagnosis not present

## 2017-07-13 DIAGNOSIS — Z992 Dependence on renal dialysis: Secondary | ICD-10-CM | POA: Diagnosis not present

## 2017-07-13 DIAGNOSIS — N186 End stage renal disease: Secondary | ICD-10-CM | POA: Diagnosis not present

## 2017-07-16 DIAGNOSIS — N186 End stage renal disease: Secondary | ICD-10-CM | POA: Diagnosis not present

## 2017-07-16 DIAGNOSIS — Z992 Dependence on renal dialysis: Secondary | ICD-10-CM | POA: Diagnosis not present

## 2017-07-18 DIAGNOSIS — N186 End stage renal disease: Secondary | ICD-10-CM | POA: Diagnosis not present

## 2017-07-18 DIAGNOSIS — Z992 Dependence on renal dialysis: Secondary | ICD-10-CM | POA: Diagnosis not present

## 2017-07-20 DIAGNOSIS — N186 End stage renal disease: Secondary | ICD-10-CM | POA: Diagnosis not present

## 2017-07-20 DIAGNOSIS — Z992 Dependence on renal dialysis: Secondary | ICD-10-CM | POA: Diagnosis not present

## 2017-07-23 DIAGNOSIS — N186 End stage renal disease: Secondary | ICD-10-CM | POA: Diagnosis not present

## 2017-07-23 DIAGNOSIS — Z992 Dependence on renal dialysis: Secondary | ICD-10-CM | POA: Diagnosis not present

## 2017-07-24 DIAGNOSIS — M47812 Spondylosis without myelopathy or radiculopathy, cervical region: Secondary | ICD-10-CM | POA: Diagnosis not present

## 2017-07-25 DIAGNOSIS — Z992 Dependence on renal dialysis: Secondary | ICD-10-CM | POA: Diagnosis not present

## 2017-07-25 DIAGNOSIS — N186 End stage renal disease: Secondary | ICD-10-CM | POA: Diagnosis not present

## 2017-07-27 DIAGNOSIS — Z992 Dependence on renal dialysis: Secondary | ICD-10-CM | POA: Diagnosis not present

## 2017-07-27 DIAGNOSIS — N186 End stage renal disease: Secondary | ICD-10-CM | POA: Diagnosis not present

## 2017-07-28 DIAGNOSIS — J42 Unspecified chronic bronchitis: Secondary | ICD-10-CM | POA: Diagnosis not present

## 2017-07-28 DIAGNOSIS — N186 End stage renal disease: Secondary | ICD-10-CM | POA: Diagnosis not present

## 2017-07-28 DIAGNOSIS — Z992 Dependence on renal dialysis: Secondary | ICD-10-CM | POA: Diagnosis not present

## 2017-07-30 DIAGNOSIS — N186 End stage renal disease: Secondary | ICD-10-CM | POA: Diagnosis not present

## 2017-07-30 DIAGNOSIS — Z23 Encounter for immunization: Secondary | ICD-10-CM | POA: Diagnosis not present

## 2017-07-30 DIAGNOSIS — Z992 Dependence on renal dialysis: Secondary | ICD-10-CM | POA: Diagnosis not present

## 2017-07-31 ENCOUNTER — Other Ambulatory Visit: Payer: Self-pay

## 2017-07-31 DIAGNOSIS — Z48812 Encounter for surgical aftercare following surgery on the circulatory system: Secondary | ICD-10-CM

## 2017-07-31 DIAGNOSIS — Z992 Dependence on renal dialysis: Principal | ICD-10-CM

## 2017-07-31 DIAGNOSIS — N186 End stage renal disease: Secondary | ICD-10-CM

## 2017-08-01 DIAGNOSIS — Z23 Encounter for immunization: Secondary | ICD-10-CM | POA: Diagnosis not present

## 2017-08-01 DIAGNOSIS — Z992 Dependence on renal dialysis: Secondary | ICD-10-CM | POA: Diagnosis not present

## 2017-08-01 DIAGNOSIS — N186 End stage renal disease: Secondary | ICD-10-CM | POA: Diagnosis not present

## 2017-08-03 DIAGNOSIS — Z23 Encounter for immunization: Secondary | ICD-10-CM | POA: Diagnosis not present

## 2017-08-03 DIAGNOSIS — N186 End stage renal disease: Secondary | ICD-10-CM | POA: Diagnosis not present

## 2017-08-03 DIAGNOSIS — Z992 Dependence on renal dialysis: Secondary | ICD-10-CM | POA: Diagnosis not present

## 2017-08-06 DIAGNOSIS — Z992 Dependence on renal dialysis: Secondary | ICD-10-CM | POA: Diagnosis not present

## 2017-08-06 DIAGNOSIS — N186 End stage renal disease: Secondary | ICD-10-CM | POA: Diagnosis not present

## 2017-08-06 DIAGNOSIS — Z23 Encounter for immunization: Secondary | ICD-10-CM | POA: Diagnosis not present

## 2017-08-08 DIAGNOSIS — N186 End stage renal disease: Secondary | ICD-10-CM | POA: Diagnosis not present

## 2017-08-08 DIAGNOSIS — Z23 Encounter for immunization: Secondary | ICD-10-CM | POA: Diagnosis not present

## 2017-08-08 DIAGNOSIS — Z992 Dependence on renal dialysis: Secondary | ICD-10-CM | POA: Diagnosis not present

## 2017-08-09 ENCOUNTER — Encounter: Payer: Medicare Other | Admitting: Vascular Surgery

## 2017-08-09 ENCOUNTER — Encounter (HOSPITAL_COMMUNITY): Payer: Medicare Other

## 2017-08-10 DIAGNOSIS — Z23 Encounter for immunization: Secondary | ICD-10-CM | POA: Diagnosis not present

## 2017-08-10 DIAGNOSIS — N186 End stage renal disease: Secondary | ICD-10-CM | POA: Diagnosis not present

## 2017-08-10 DIAGNOSIS — Z992 Dependence on renal dialysis: Secondary | ICD-10-CM | POA: Diagnosis not present

## 2017-08-13 DIAGNOSIS — N186 End stage renal disease: Secondary | ICD-10-CM | POA: Diagnosis not present

## 2017-08-13 DIAGNOSIS — Z992 Dependence on renal dialysis: Secondary | ICD-10-CM | POA: Diagnosis not present

## 2017-08-13 DIAGNOSIS — Z23 Encounter for immunization: Secondary | ICD-10-CM | POA: Diagnosis not present

## 2017-08-15 DIAGNOSIS — Z992 Dependence on renal dialysis: Secondary | ICD-10-CM | POA: Diagnosis not present

## 2017-08-15 DIAGNOSIS — N186 End stage renal disease: Secondary | ICD-10-CM | POA: Diagnosis not present

## 2017-08-15 DIAGNOSIS — Z23 Encounter for immunization: Secondary | ICD-10-CM | POA: Diagnosis not present

## 2017-08-16 ENCOUNTER — Other Ambulatory Visit: Payer: Self-pay | Admitting: *Deleted

## 2017-08-16 ENCOUNTER — Encounter: Payer: Self-pay | Admitting: *Deleted

## 2017-08-16 ENCOUNTER — Ambulatory Visit (INDEPENDENT_AMBULATORY_CARE_PROVIDER_SITE_OTHER): Payer: Self-pay | Admitting: Vascular Surgery

## 2017-08-16 ENCOUNTER — Ambulatory Visit (HOSPITAL_COMMUNITY)
Admission: RE | Admit: 2017-08-16 | Discharge: 2017-08-16 | Disposition: A | Discharge status: Home / Self Care | Payer: Medicare Other | Source: Ambulatory Visit | Attending: Vascular Surgery | Admitting: Vascular Surgery

## 2017-08-16 ENCOUNTER — Encounter: Payer: Self-pay | Admitting: Vascular Surgery

## 2017-08-16 VITALS — BP 138/86 | HR 92 | Temp 97.6°F | Resp 18 | Ht 64.0 in | Wt 147.5 lb

## 2017-08-16 DIAGNOSIS — Z992 Dependence on renal dialysis: Secondary | ICD-10-CM

## 2017-08-16 DIAGNOSIS — N186 End stage renal disease: Secondary | ICD-10-CM

## 2017-08-16 DIAGNOSIS — Z48812 Encounter for surgical aftercare following surgery on the circulatory system: Secondary | ICD-10-CM | POA: Diagnosis not present

## 2017-08-16 NOTE — Progress Notes (Signed)
Patient ID: Patricia Ayala, female   DOB: July 19, 1929, 83 y.o.   MRN: [id number]  Reason for Consult: Follow-up (4-6 weeks FU with dialysis duplex,  s/p 1st stage L BVT , dialysis Tu/Thurs/Sat )   Referred by [name], MD  Subjective:     HPI:  Patricia Ayala is a 83 y.o. female with history of end-stage renal disease previous left brachiocephalic AV fistula. She now on dialysis via tunnel catheter. Recently underwent left brachial basilic AV fistula creation. She has no new hand symptoms and is healed well from her previous incision. She follows up today with duplex possible scheduling of second stage procedure.  Past Medical History:  Diagnosis Date  . Anemia 02/11/2012  . Anxiety   . Aortic stenosis    mild AS 10/2016 echo  . Arthritis    arthritis in neck  . Atrial fibrillation (HCC)   . CHF (congestive heart failure) (HCC)   . Chronic kidney disease    hemodialysis Tu/Th/Sa  . Dyspnea    on exertion  . GERD (gastroesophageal reflux disease)   . H/O metabolic acidosis   . Headache   . Hyperlipidemia   . Hyperparathyroidism, secondary (HCC)   . Hypertension    Dr. Regino Schultze, [place], [place]  . Oxygen dependent   . Peptic ulcer   . Peripheral vascular disease (HCC)   . Vitamin D deficiency    Family History  Problem Relation Age of Onset  . Stroke Mother   . Cancer Father   . Heart attack Father    Past Surgical History:  Procedure Laterality Date  . A/V FISTULAGRAM N/A 05/10/2017   Procedure: A/V Fistulagram;  Surgeon: Sherren Kerns, MD;  Location: [facility] INVASIVE CV LAB;  Service: Cardiovascular;  Laterality: N/A;  . ABDOMINAL HYSTERECTOMY    . AV FISTULA PLACEMENT  04/01/2012   Procedure: ARTERIOVENOUS (AV) FISTULA CREATION;  Surgeon: Sherren Kerns, MD;  Location: [facility] OR;  Service: Vascular;  Laterality: Left;  . AV FISTULA PLACEMENT Left 06/24/2017   Procedure: ARTERIOVENOUS (AV) FISTULA CREATION;  Surgeon: Maeola Harman, MD;  Location: [facility] OR;   Service: Vascular;  Laterality: Left;  . CHOLECYSTECTOMY    . COLON SURGERY     for a blockage  . ESOPHAGOGASTRODUODENOSCOPY N/A 02/17/2017   Procedure: ESOPHAGOGASTRODUODENOSCOPY (EGD);  Surgeon: Sherrilyn Rist, MD;  Location: [facility] ENDOSCOPY;  Service: Endoscopy;  Laterality: N/A;  Bedside case in ICU  . EYE SURGERY     bilateral cataracts, /w IOL - 2013  . FISTULOGRAM N/A 11/05/2014   Procedure: FISTULOGRAM;  Surgeon: Larina Earthly, MD;  Location: [facility] CATH LAB;  Service: Cardiovascular;  Laterality: N/A;  . INSERTION OF DIALYSIS CATHETER  04/10/2012   Procedure: INSERTION OF DIALYSIS CATHETER;  Surgeon: Pryor Ochoa, MD;  Location: [facility] OR;  Service: Vascular;  Laterality: N/A;  Insertion Diatek Catheter Right Internal Jugular  . LIGATION OF COMPETING BRANCHES OF ARTERIOVENOUS FISTULA  11/07/2012   Procedure: LIGATION OF COMPETING BRANCHES OF ARTERIOVENOUS FISTULA;  Surgeon: Chuck Hint, MD;  Location: [facility] OR;  Service: Vascular;  Laterality: Left;  Brachio/Cephalic Fistula  . SHUNTOGRAM N/A 11/03/2012   Procedure: Betsey Amen;  Surgeon: Chuck Hint, MD;  Location: [facility] CATH LAB;  Service: Cardiovascular;  Laterality: N/A;  . SHUNTOGRAM Left 03/09/2013   Procedure: Fistulogram;  Surgeon: Fransisco Hertz, MD;  Location: [facility] CATH LAB;  Service: Cardiovascular;  Laterality: Left;  . SHUNTOGRAM Left 11/23/2013   Procedure: FISTULOGRAM;  Surgeon:  Fransisco Hertz, MD;  Location: [facility] CATH LAB;  Service: Cardiovascular;  Laterality: Left;    Short Social History:  Social History  Substance Use Topics  . Smoking status: Never Smoker  . Smokeless tobacco: Never Used  . Alcohol use No    No Known Allergies  Current Outpatient Prescriptions  Medication Sig Dispense Refill  . acetaminophen (TYLENOL) 325 MG tablet Take 650 mg by mouth every 4 (four) hours as needed.    . ALPRAZolam (XANAX) 0.5 MG tablet Take 1 tablet by mouth 3 (three) times daily as needed.  0  . cinacalcet (SENSIPAR) 30 MG  tablet Take 30 mg by mouth daily.    Marland Kitchen diltiazem (CARDIZEM CD) 360 MG 24 hr capsule Take 1 capsule (360 mg total) by mouth daily. 360 capsule 1  . metoprolol tartrate (LOPRESSOR) 25 MG tablet Take 1 tablet (25 mg total) by mouth 2 (two) times daily. 180 tablet 3  . multivitamin (RENA-VIT) TABS tablet Take 1 tablet by mouth daily.    Marland Kitchen oxyCODONE-acetaminophen (PERCOCET/ROXICET) 5-325 MG tablet Take 1 tablet by mouth every 6 (six) hours as needed. 6 tablet 0  . pantoprazole (PROTONIX) 40 MG tablet Take 1 tablet (40 mg total) by mouth 2 (two) times daily. 30 tablet 0  . promethazine (PHENERGAN) 25 MG tablet Take 25 mg by mouth every 6 (six) hours as needed for nausea or vomiting.    . sevelamer carbonate (RENVELA) 800 MG tablet Take 1,600 mg by mouth 3 (three) times daily with meals. Take 800mg  with snacks daily.      No current facility-administered medications for this visit.     Review of Systems  Constitutional:  Constitutional negative. HENT: HENT negative.  Eyes: Eyes negative.  Respiratory: Positive for shortness of breath.  Cardiovascular: Positive for irregular heartbeat and leg swelling.  GI: Gastrointestinal negative.  Musculoskeletal: Musculoskeletal negative.  Skin: Skin negative.  Neurological: Positive for focal weakness.  Hematologic: Hematologic/lymphatic negative.  Psychiatric: Psychiatric negative.        Objective:  Objective   Vitals:   08/16/17 1537  BP: 138/86  Pulse: 92  Resp: 18  Temp: 97.6 F (36.4 C)  TempSrc: Oral  SpO2: 98%  Weight: 147 lb 8 oz (66.9 kg)  Height: 5\' 4"  (1.626 m)   Body mass index is 25.32 kg/m.  Physical Exam  Constitutional: She appears well-developed.  HENT:  Head: Normocephalic.  Eyes: Pupils are equal, round, and reactive to light.  Neck: Normal range of motion.  Abdominal: Soft.  Musculoskeletal: Normal range of motion. She exhibits no edema.  Strong left arm thrill  Skin: Skin is warm and dry.  Psychiatric: She  has a normal mood and affect. Her behavior is normal. Judgment and thought content normal.    Data: I interpreted her dialysis duplex today to have flow volumes of 151 mL/m. The study was very difficult and I did witness some of the study but I disagree with the results when reconciling this with her physical exam with a strong palpable thrill on the medial upper arm.     Assessment/Plan:     83 year old female here for follow-up from recent dialysis access placement.duplex today demonstrates very low flow volumes are very small fistula which is a strong thrill in the upper arm. We'll get her scheduled for second stage basilic vein transposition fistula with possible evidence of fibroids or correction may need a graft placement at that time. She has good understanding and will get her scheduled on nondialysis  Monday or Wednesday in the very near future.     Maeola HarmanBrandon Christopher Avondre Richens MD [facility]

## 2017-08-17 DIAGNOSIS — Z992 Dependence on renal dialysis: Secondary | ICD-10-CM | POA: Diagnosis not present

## 2017-08-17 DIAGNOSIS — Z23 Encounter for immunization: Secondary | ICD-10-CM | POA: Diagnosis not present

## 2017-08-17 DIAGNOSIS — N186 End stage renal disease: Secondary | ICD-10-CM | POA: Diagnosis not present

## 2017-08-20 DIAGNOSIS — Z992 Dependence on renal dialysis: Secondary | ICD-10-CM | POA: Diagnosis not present

## 2017-08-20 DIAGNOSIS — Z23 Encounter for immunization: Secondary | ICD-10-CM | POA: Diagnosis not present

## 2017-08-20 DIAGNOSIS — N186 End stage renal disease: Secondary | ICD-10-CM | POA: Diagnosis not present

## 2017-08-22 DIAGNOSIS — N186 End stage renal disease: Secondary | ICD-10-CM | POA: Diagnosis not present

## 2017-08-22 DIAGNOSIS — Z992 Dependence on renal dialysis: Secondary | ICD-10-CM | POA: Diagnosis not present

## 2017-08-22 DIAGNOSIS — Z23 Encounter for immunization: Secondary | ICD-10-CM | POA: Diagnosis not present

## 2017-08-23 ENCOUNTER — Encounter (HOSPITAL_COMMUNITY): Payer: Self-pay | Admitting: *Deleted

## 2017-08-23 NOTE — Progress Notes (Signed)
Spoke with pt's husband, Ivar Drape for pre-op call. Pt was napping. Pt has hx of A-fib, followed by Dr. Purvis Sheffield. He states she has not had any recent chest pain or sob. He states pt is not diabetic.

## 2017-08-24 DIAGNOSIS — Z992 Dependence on renal dialysis: Secondary | ICD-10-CM | POA: Diagnosis not present

## 2017-08-24 DIAGNOSIS — N186 End stage renal disease: Secondary | ICD-10-CM | POA: Diagnosis not present

## 2017-08-24 DIAGNOSIS — Z23 Encounter for immunization: Secondary | ICD-10-CM | POA: Diagnosis not present

## 2017-08-26 ENCOUNTER — Encounter (HOSPITAL_COMMUNITY)
Admission: RE | Disposition: A | Discharge status: Home / Self Care | Payer: Self-pay | Source: Ambulatory Visit | Attending: Vascular Surgery

## 2017-08-26 ENCOUNTER — Other Ambulatory Visit: Payer: Self-pay

## 2017-08-26 ENCOUNTER — Telehealth: Payer: Self-pay | Admitting: Vascular Surgery

## 2017-08-26 ENCOUNTER — Ambulatory Visit (HOSPITAL_COMMUNITY): Payer: Medicare Other | Admitting: Certified Registered Nurse Anesthetist

## 2017-08-26 ENCOUNTER — Encounter (HOSPITAL_COMMUNITY): Payer: Self-pay | Admitting: *Deleted

## 2017-08-26 ENCOUNTER — Ambulatory Visit (HOSPITAL_COMMUNITY)
Admission: RE | Admit: 2017-08-26 | Discharge: 2017-08-26 | Disposition: A | Discharge status: Home / Self Care | Payer: Medicare Other | Source: Ambulatory Visit | Attending: Vascular Surgery | Admitting: Vascular Surgery

## 2017-08-26 DIAGNOSIS — I5033 Acute on chronic diastolic (congestive) heart failure: Secondary | ICD-10-CM | POA: Diagnosis not present

## 2017-08-26 DIAGNOSIS — Z992 Dependence on renal dialysis: Secondary | ICD-10-CM

## 2017-08-26 DIAGNOSIS — I739 Peripheral vascular disease, unspecified: Secondary | ICD-10-CM | POA: Diagnosis not present

## 2017-08-26 DIAGNOSIS — I120 Hypertensive chronic kidney disease with stage 5 chronic kidney disease or end stage renal disease: Secondary | ICD-10-CM | POA: Insufficient documentation

## 2017-08-26 DIAGNOSIS — Z48812 Encounter for surgical aftercare following surgery on the circulatory system: Secondary | ICD-10-CM

## 2017-08-26 DIAGNOSIS — Z791 Long term (current) use of non-steroidal anti-inflammatories (NSAID): Secondary | ICD-10-CM | POA: Diagnosis not present

## 2017-08-26 DIAGNOSIS — Z79899 Other long term (current) drug therapy: Secondary | ICD-10-CM | POA: Diagnosis not present

## 2017-08-26 DIAGNOSIS — I4891 Unspecified atrial fibrillation: Secondary | ICD-10-CM | POA: Diagnosis not present

## 2017-08-26 DIAGNOSIS — M1990 Unspecified osteoarthritis, unspecified site: Secondary | ICD-10-CM | POA: Diagnosis not present

## 2017-08-26 DIAGNOSIS — N186 End stage renal disease: Secondary | ICD-10-CM

## 2017-08-26 DIAGNOSIS — E785 Hyperlipidemia, unspecified: Secondary | ICD-10-CM | POA: Diagnosis not present

## 2017-08-26 HISTORY — PX: BASCILIC VEIN TRANSPOSITION: SHX5742

## 2017-08-26 LAB — POTASSIUM: POTASSIUM: 4.5 mmol/L (ref 3.5–5.1)

## 2017-08-26 SURGERY — TRANSPOSITION, VEIN, BASILIC
Anesthesia: Monitor Anesthesia Care | Site: Arm Upper | Laterality: Left

## 2017-08-26 MED ORDER — PHENYLEPHRINE HCL 10 MG/ML IJ SOLN
INTRAMUSCULAR | Status: AC
  Filled 2017-08-26: qty 1

## 2017-08-26 MED ORDER — LIDOCAINE 2% (20 MG/ML) 5 ML SYRINGE
INTRAMUSCULAR | Status: AC
  Filled 2017-08-26: qty 5

## 2017-08-26 MED ORDER — FENTANYL CITRATE (PF) 100 MCG/2ML IJ SOLN
INTRAMUSCULAR | Status: AC
  Filled 2017-08-26: qty 2

## 2017-08-26 MED ORDER — CHLORHEXIDINE GLUCONATE 4 % EX LIQD: 60.0000 mL | Freq: Once | CUTANEOUS | Status: DC

## 2017-08-26 MED ORDER — ONDANSETRON HCL 4 MG/2ML IJ SOLN
INTRAMUSCULAR | Status: DC | PRN
  Administered 2017-08-26: 4 mg via INTRAVENOUS

## 2017-08-26 MED ORDER — CEFUROXIME SODIUM 750 MG IJ SOLR
INTRAMUSCULAR | Status: AC
  Filled 2017-08-26: qty 1500

## 2017-08-26 MED ORDER — FENTANYL CITRATE (PF) 100 MCG/2ML IJ SOLN
INTRAMUSCULAR | Status: DC | PRN
  Administered 2017-08-26 (×2): 50 ug via INTRAVENOUS

## 2017-08-26 MED ORDER — FENTANYL CITRATE (PF) 100 MCG/2ML IJ SOLN
25.0000 ug | INTRAMUSCULAR | Status: DC | PRN
  Administered 2017-08-26: 25 ug via INTRAVENOUS

## 2017-08-26 MED ORDER — DEXTROSE 5 % IV SOLN
1.5000 g | INTRAVENOUS | Status: AC
  Administered 2017-08-26: 1.5 g via INTRAVENOUS
  Filled 2017-08-26: qty 1.5

## 2017-08-26 MED ORDER — LIDOCAINE HCL (CARDIAC) 20 MG/ML IV SOLN
INTRAVENOUS | Status: DC | PRN
  Administered 2017-08-26: 50 mg via INTRAVENOUS

## 2017-08-26 MED ORDER — OXYCODONE-ACETAMINOPHEN 5-325 MG PO TABS: 1.0000 | ORAL_TABLET | Freq: Four times a day (QID) | ORAL | 0 refills | Status: DC | PRN

## 2017-08-26 MED ORDER — PHENYLEPHRINE HCL 10 MG/ML IJ SOLN
INTRAMUSCULAR | Status: DC | PRN
  Administered 2017-08-26: 100 ug/min via INTRAVENOUS

## 2017-08-26 MED ORDER — PROPOFOL 10 MG/ML IV BOLUS
INTRAVENOUS | Status: DC | PRN
  Administered 2017-08-26: 100 mg via INTRAVENOUS

## 2017-08-26 MED ORDER — 0.9 % SODIUM CHLORIDE (POUR BTL) OPTIME
TOPICAL | Status: DC | PRN
  Administered 2017-08-26: 1000 mL

## 2017-08-26 MED ORDER — SODIUM CHLORIDE 0.9 % IV SOLN
INTRAVENOUS | Status: DC | PRN
  Administered 2017-08-26: 12:00:00

## 2017-08-26 MED ORDER — ONDANSETRON HCL 4 MG/2ML IJ SOLN: 4.0000 mg | Freq: Once | INTRAMUSCULAR | Status: DC | PRN

## 2017-08-26 MED ORDER — SODIUM CHLORIDE 0.9 % IV SOLN
INTRAVENOUS | Status: DC
  Administered 2017-08-26 (×2): via INTRAVENOUS

## 2017-08-26 MED ORDER — DEXAMETHASONE SODIUM PHOSPHATE 10 MG/ML IJ SOLN: INTRAMUSCULAR | Status: DC | PRN

## 2017-08-26 MED ORDER — FENTANYL CITRATE (PF) 250 MCG/5ML IJ SOLN
INTRAMUSCULAR | Status: AC
  Filled 2017-08-26: qty 5

## 2017-08-26 MED ORDER — ONDANSETRON HCL 4 MG/2ML IJ SOLN
INTRAMUSCULAR | Status: AC
  Filled 2017-08-26: qty 2

## 2017-08-26 SURGICAL SUPPLY — 40 items
ARMBAND PINK RESTRICT EXTREMIT (MISCELLANEOUS) ×3 IMPLANT
CANISTER SUCT 3000ML PPV (MISCELLANEOUS) ×3 IMPLANT
CLIP VESOCCLUDE MED 24/CT (CLIP) IMPLANT
CLIP VESOCCLUDE MED 6/CT (CLIP) IMPLANT
CLIP VESOCCLUDE SM WIDE 24/CT (CLIP) IMPLANT
CLIP VESOCCLUDE SM WIDE 6/CT (CLIP) IMPLANT
COVER PROBE W GEL 5X96 (DRAPES) ×3 IMPLANT
DERMABOND ADVANCED (GAUZE/BANDAGES/DRESSINGS) ×2
DERMABOND ADVANCED .7 DNX12 (GAUZE/BANDAGES/DRESSINGS) ×1 IMPLANT
ELECT REM PT RETURN 9FT ADLT (ELECTROSURGICAL) ×3
ELECTRODE REM PT RTRN 9FT ADLT (ELECTROSURGICAL) ×1 IMPLANT
GLOVE BIO SURGEON STRL SZ 6.5 (GLOVE) ×2 IMPLANT
GLOVE BIO SURGEON STRL SZ7.5 (GLOVE) ×3 IMPLANT
GLOVE BIO SURGEONS STRL SZ 6.5 (GLOVE) ×1
GLOVE BIOGEL PI IND STRL 6.5 (GLOVE) ×1 IMPLANT
GLOVE BIOGEL PI IND STRL 7.0 (GLOVE) ×1 IMPLANT
GLOVE BIOGEL PI IND STRL 8 (GLOVE) ×1 IMPLANT
GLOVE BIOGEL PI IND STRL 8.5 (GLOVE) ×1 IMPLANT
GLOVE BIOGEL PI INDICATOR 6.5 (GLOVE) ×2
GLOVE BIOGEL PI INDICATOR 7.0 (GLOVE) ×2
GLOVE BIOGEL PI INDICATOR 8 (GLOVE) ×2
GLOVE BIOGEL PI INDICATOR 8.5 (GLOVE) ×2
GLOVE SS BIOGEL STRL SZ 8 (GLOVE) ×1 IMPLANT
GLOVE SUPERSENSE BIOGEL SZ 8 (GLOVE) ×2
GOWN STRL REUS W/ TWL LRG LVL3 (GOWN DISPOSABLE) ×2 IMPLANT
GOWN STRL REUS W/ TWL XL LVL3 (GOWN DISPOSABLE) ×2 IMPLANT
GOWN STRL REUS W/TWL LRG LVL3 (GOWN DISPOSABLE) ×4
GOWN STRL REUS W/TWL XL LVL3 (GOWN DISPOSABLE) ×4
KIT BASIN OR (CUSTOM PROCEDURE TRAY) ×3 IMPLANT
KIT ROOM TURNOVER OR (KITS) ×3 IMPLANT
NS IRRIG 1000ML POUR BTL (IV SOLUTION) ×3 IMPLANT
PACK CV ACCESS (CUSTOM PROCEDURE TRAY) ×3 IMPLANT
PAD ARMBOARD 7.5X6 YLW CONV (MISCELLANEOUS) ×6 IMPLANT
SUT MNCRL AB 4-0 PS2 18 (SUTURE) ×3 IMPLANT
SUT PROLENE 6 0 BV (SUTURE) ×6 IMPLANT
SUT SILK 2 0 SH (SUTURE) IMPLANT
SUT VIC AB 3-0 SH 27 (SUTURE) ×2
SUT VIC AB 3-0 SH 27X BRD (SUTURE) ×1 IMPLANT
UNDERPAD 30X30 (UNDERPADS AND DIAPERS) ×3 IMPLANT
WATER STERILE IRR 1000ML POUR (IV SOLUTION) ×3 IMPLANT

## 2017-08-26 NOTE — Telephone Encounter (Signed)
-----   Message from Sharee Pimple, RN sent at 08/26/2017  1:24 PM EDT ----- Regarding: 6 weeks w/ duplex   ----- Message ----- From: Maeola Harman, MD Sent: 08/26/2017  12:55 PM To: Vvs Charge [address]  EVLIN BRITTAIN [id number] 1928-12-26  08/26/2017 Pre-operative Diagnosis: esrd  Surgeon:  Luanna Salk. Randie Heinz, MD Assistant: Debbora Presto  Procedure Performed:  Left arm second stage basilic vein transposition av fistula   F/u in 6 weeks with left arm dialysis duplex.

## 2017-08-26 NOTE — Transfer of Care (Signed)
Immediate Anesthesia Transfer of Care Note  Patient: Patricia Ayala  Procedure(s) Performed: BASILIC VEIN TRANSPOSITION SECOND STAGE (Left Arm Upper)  Patient Location: PACU  Anesthesia Type:General  Level of Consciousness: awake, alert  and sedated  Airway & Oxygen Therapy: Patient connected to nasal cannula oxygen  Post-op Assessment: Post -op Vital signs reviewed and stable  Post vital signs: stable  Last Vitals:  Vitals:   08/26/17 0844  BP: 104/80  Pulse: (!) 52  Resp: 18  Temp: 36.8 C  SpO2: 98%    Last Pain:  Vitals:   08/26/17 0844  TempSrc: Oral      Patients Stated Pain Goal: 4 (08/26/17 0917)  Complications: No apparent anesthesia complications

## 2017-08-26 NOTE — Anesthesia Procedure Notes (Signed)
Procedure Name: LMA Insertion Date/Time: 08/26/2017 11:30 AM Performed by: Dorie Rank Pre-anesthesia Checklist: Patient identified, Emergency Drugs available, Suction available, Patient being monitored and Timeout performed Patient Re-evaluated:Patient Re-evaluated prior to induction Oxygen Delivery Method: Circle system utilized Preoxygenation: Pre-oxygenation with 100% oxygen Induction Type: IV induction Ventilation: Mask ventilation without difficulty LMA: LMA inserted LMA Size: 4.0 Number of attempts: 1 Placement Confirmation: positive ETCO2 and breath sounds checked- equal and bilateral Tube secured with: Tape Dental Injury: Teeth and Oropharynx as per pre-operative assessment

## 2017-08-26 NOTE — Anesthesia Preprocedure Evaluation (Addendum)
Anesthesia Evaluation  Patient identified by MRN, date of birth, ID band Patient awake    Reviewed: Allergy & Precautions, NPO status , Patient's Chart, lab work & pertinent test results  Airway Mallampati: II  TM Distance: >3 FB Neck ROM: Full    Dental  (+) Missing, Partial Lower, Dental Advisory Given   Pulmonary    Pulmonary exam normal breath sounds clear to auscultation       Cardiovascular hypertension, + Peripheral Vascular Disease  + dysrhythmias Atrial Fibrillation + Valvular Problems/Murmurs AS  Rhythm:Irregular Rate:Normal  TTE 2018 - Ejection fraction was 55%. Mild aortic stenosis. Severely dilated LA, mildly dilated RA. Small to moderate size pericardial effusion. No evidence of tamponade physiology.   Neuro/Psych Anxiety negative neurological ROS     GI/Hepatic Neg liver ROS,   Endo/Other  negative endocrine ROS  Renal/GU ESRF and DialysisRenal disease  negative genitourinary   Musculoskeletal  (+) Arthritis ,   Abdominal   Peds  Hematology  (+) anemia ,   Anesthesia Other Findings   Reproductive/Obstetrics                           Anesthesia Physical  Anesthesia Plan  ASA: IV  Anesthesia Plan: General   Post-op Pain Management:    Induction:   PONV Risk Score and Plan: 3 and Ondansetron, Dexamethasone and Treatment may vary due to age or medical condition  Airway Management Planned: LMA  Additional Equipment: None  Intra-op Plan:   Post-operative Plan:   Informed Consent: I have reviewed the patients History and Physical, chart, labs and discussed the procedure including the risks, benefits and alternatives for the proposed anesthesia with the patient or authorized representative who has indicated his/her understanding and acceptance.   Dental advisory given  Plan Discussed with: CRNA and Surgeon  Anesthesia Plan Comments:        Anesthesia Quick  Evaluation

## 2017-08-26 NOTE — H&P (Signed)
   History and Physical Update  The patient was interviewed and re-examined.  The patient's previous History and Physical has been reviewed and is unchanged from recent office visit. Plan for left arm second stage bvt or graft if vein is inadequate.  Patricia Ayala C. Randie Heinz, MD [facility] [place] Office: [phone] Pager: [phone]   08/26/2017, 9:35 AM

## 2017-08-26 NOTE — Telephone Encounter (Signed)
Sched lab 10/09/17 at 3:30 and MD 10/11/17 at 2:00. Vm full, mailed appt letter.

## 2017-08-26 NOTE — Op Note (Signed)
    Patient name: Patricia Ayala MRN: [id number] DOB: [birth year] Sex: female  08/26/2017 Pre-operative Diagnosis: esrd Post-operative diagnosis:  Same Surgeon:  Erlene Quan C. Donzetta Matters, MD Assistant: Linus Orn Procedure Performed:  Left arm second stage basilic vein transposition av fistula   Indications: 83 year old female with history of failed left upper extremity access now on dialysis via catheter and is indicated for new permanent access placement.  Findings: The basilic vein fistula on the left had an early branching which probably affected its maturation. easily dilated to 4 mm and a completion was a strong thrill in the vein tunnel on the medial aspect of the arm and a signal at the left radial artery stable from preoperative exam.   Procedure:  The patient was identified in the holding area and taken to the operating room where she was placed supine on the operating table and MAC anesthesia induced and she was sterilely prepped and draped in the left arm in the usual fashion.  She was given antibiotics and timeout called.  Began with ultrasound-guided evaluation of the basilic vein fistula.  An incision was then made above the antecubital overlying the fistula we dissected down onto the vein.  The vein was then dissected out for several centimeters and branches were taken between clips and ties.  I then made a counterincision towards the axilla dissected the vein further out where it entered the deep system.  The nerve was protected throughout its course.  The vein was then clamped near the antecubital and and divided marked for orientation flushed with heparinized saline.  It was then tunneled and sewn end-to-end after especially in the ends with 6-0 Prolene suture.  I did flush both directions with heparinized saline prior to doing this.  We then allow flushing techniques prior to completing anastomosis.  I completion 1 repair stitch was placed.  There was a palpable thrill in the vein.  There  was a strong signal at the level of the radial artery and a strong signal in the axillary vein runoff.  We obtained hemostasis and the wound closed in 2 layers with 3-0 Vicryl 4-0 Monocryl and Dermabond placed at the level of the skin.  All counts were correct at completion.  Patient tolerated procedure without immediate complication.  EBL 50 cc.    Roizy Harold C. Donzetta Matters, MD [facility] [place] Office: [phone] Pager: [phone]

## 2017-08-26 NOTE — Anesthesia Postprocedure Evaluation (Signed)
Anesthesia Post Note  Patient: Patricia Ayala  Procedure(s) Performed: BASILIC VEIN TRANSPOSITION SECOND STAGE (Left Arm Upper)     Patient location during evaluation: PACU Anesthesia Type: General Level of consciousness: awake and alert Pain management: pain level controlled Vital Signs Assessment: post-procedure vital signs reviewed and stable Respiratory status: spontaneous breathing, nonlabored ventilation, respiratory function stable and patient connected to nasal cannula oxygen Cardiovascular status: blood pressure returned to baseline and stable Postop Assessment: no apparent nausea or vomiting Anesthetic complications: no    Last Vitals:  Vitals:   08/26/17 1415 08/26/17 1429  BP: (!) 95/58 (!) 92/54  Pulse:    Resp: 20 20  Temp: (!) 36.1 C   SpO2:      Last Pain:  Vitals:   08/26/17 1341  TempSrc:   PainSc: 8                  Beryle Lathe

## 2017-08-27 ENCOUNTER — Encounter (HOSPITAL_COMMUNITY): Payer: Self-pay | Admitting: Vascular Surgery

## 2017-08-27 DIAGNOSIS — Z992 Dependence on renal dialysis: Secondary | ICD-10-CM | POA: Diagnosis not present

## 2017-08-27 DIAGNOSIS — Z1159 Encounter for screening for other viral diseases: Secondary | ICD-10-CM | POA: Diagnosis not present

## 2017-08-27 DIAGNOSIS — Z23 Encounter for immunization: Secondary | ICD-10-CM | POA: Diagnosis not present

## 2017-08-27 DIAGNOSIS — N186 End stage renal disease: Secondary | ICD-10-CM | POA: Diagnosis not present

## 2017-08-27 LAB — POCT I-STAT 4, (NA,K, GLUC, HGB,HCT)
Glucose, Bld: 111 mg/dL — ABNORMAL HIGH (ref 65–99)
HEMATOCRIT: 40 % (ref 36.0–46.0)
HEMOGLOBIN: 13.6 g/dL (ref 12.0–15.0)
Potassium: >8.5 mmol/L (ref 3.5–5.1)
SODIUM: 135 mmol/L (ref 135–145)

## 2017-08-28 ENCOUNTER — Encounter: Payer: Self-pay | Admitting: Cardiovascular Disease

## 2017-08-28 ENCOUNTER — Ambulatory Visit (INDEPENDENT_AMBULATORY_CARE_PROVIDER_SITE_OTHER): Payer: Medicare Other | Admitting: Cardiovascular Disease

## 2017-08-28 VITALS — BP 124/68 | HR 81 | Ht 64.0 in | Wt 149.0 lb

## 2017-08-28 DIAGNOSIS — I313 Pericardial effusion (noninflammatory): Secondary | ICD-10-CM | POA: Diagnosis not present

## 2017-08-28 DIAGNOSIS — I3139 Other pericardial effusion (noninflammatory): Secondary | ICD-10-CM

## 2017-08-28 DIAGNOSIS — I10 Essential (primary) hypertension: Secondary | ICD-10-CM

## 2017-08-28 DIAGNOSIS — N186 End stage renal disease: Secondary | ICD-10-CM

## 2017-08-28 DIAGNOSIS — I350 Nonrheumatic aortic (valve) stenosis: Secondary | ICD-10-CM | POA: Diagnosis not present

## 2017-08-28 DIAGNOSIS — J42 Unspecified chronic bronchitis: Secondary | ICD-10-CM | POA: Diagnosis not present

## 2017-08-28 DIAGNOSIS — M79605 Pain in left leg: Secondary | ICD-10-CM | POA: Diagnosis not present

## 2017-08-28 DIAGNOSIS — Z992 Dependence on renal dialysis: Secondary | ICD-10-CM | POA: Diagnosis not present

## 2017-08-28 DIAGNOSIS — I482 Chronic atrial fibrillation, unspecified: Secondary | ICD-10-CM

## 2017-08-28 NOTE — Patient Instructions (Signed)

## 2017-08-28 NOTE — Progress Notes (Signed)
SUBJECTIVE: The patient presents for follow up of atrial fibrillation, aortic stenosis, and pericardial effusion. She has ESRD and had been anticoagulated with warfarin. She also has chronic nonocclusive DVT of the left popliteal and femoral veins.  She recently underwent left arm second stage basilic vein transposition AV fistula.  Echocardiogram 11/09/16: Normal left ventricular systolic function, LVEF 55%, mild aortic stenosis, severe left atrial enlargement, small to moderate size pericardial effusion.  Hospitalized for a GI bleed in April 2018 requiring 5 units PRBC transfusion.  EGD showed gastric ulcer with visible vessel which was cauterized.  Warfarin was stopped.  She denies chest pain, palpitations, and shortness of breath.  Her primary complaint relates to chronic left leg pain.  I previously prescribed compression stockings but she said they did not help.   Review of Systems: As per "subjective", otherwise negative.  No Known Allergies  Current Outpatient Prescriptions  Medication Sig Dispense Refill  . acetaminophen (TYLENOL) 325 MG tablet Take 650 mg by mouth every 4 (four) hours as needed.    . ALPRAZolam (XANAX) 0.5 MG tablet Take 1 tablet by mouth 3 (three) times daily as needed for anxiety.   0  . cinacalcet (SENSIPAR) 30 MG tablet Take 30 mg by mouth daily.    Marland Kitchen. diltiazem (CARDIZEM CD) 360 MG 24 hr capsule Take 1 capsule (360 mg total) by mouth daily. 360 capsule 1  . HYDROcodone-acetaminophen (NORCO) 10-325 MG tablet Take 1 tablet by mouth every 6 (six) hours as needed for moderate pain.    . metoprolol tartrate (LOPRESSOR) 25 MG tablet Take 1 tablet (25 mg total) by mouth 2 (two) times daily. 180 tablet 3  . multivitamin (RENA-VIT) TABS tablet Take 1 tablet by mouth daily.    Marland Kitchen. oxyCODONE-acetaminophen (ROXICET) 5-325 MG tablet Take 1 tablet by mouth every 6 (six) hours as needed. 10 tablet 0  . pantoprazole (PROTONIX) 40 MG tablet Take 1 tablet (40 mg  total) by mouth 2 (two) times daily. 30 tablet 0  . predniSONE (DELTASONE) 10 MG tablet     . promethazine (PHENERGAN) 25 MG tablet Take 25 mg by mouth every 6 (six) hours as needed for nausea or vomiting.    . sevelamer carbonate (RENVELA) 800 MG tablet Take 1,600 mg by mouth 3 (three) times daily with meals. Take 800mg  with snacks daily.      No current facility-administered medications for this visit.     Past Medical History:  Diagnosis Date  . Anemia 02/11/2012  . Anxiety   . Aortic stenosis    mild AS 10/2016 echo  . Arthritis    arthritis in neck  . Atrial fibrillation (HCC)   . CHF (congestive heart failure) (HCC)   . Chronic kidney disease    hemodialysis Tu/Th/Sa  . Dyspnea    on exertion  . GERD (gastroesophageal reflux disease)   . H/O metabolic acidosis   . Headache   . Hyperlipidemia   . Hyperparathyroidism, secondary (HCC)   . Hypertension    Dr. Regino SchultzeMcGough, [place]  . Oxygen dependent   . Peptic ulcer   . Peripheral vascular disease (HCC)   . Vitamin D deficiency     Past Surgical History:  Procedure Laterality Date  . A/V FISTULAGRAM N/A 05/10/2017   Procedure: A/V Fistulagram;  Surgeon: Sherren KernsFields, Charles E, MD;  [facility] LAB;  Service: Cardiovascular;  Laterality: N/A;  . ABDOMINAL HYSTERECTOMY    . AV FISTULA PLACEMENT  04/01/2012  Procedure: ARTERIOVENOUS (AV) FISTULA CREATION;  Surgeon: Sherren Kerns, MD;  Location: [facility] OR;  Service: Vascular;  Laterality: Left;  . AV FISTULA PLACEMENT Left 06/24/2017   Procedure: ARTERIOVENOUS (AV) FISTULA CREATION;  Surgeon: Maeola Harman, MD;  Location: [facility] OR;  Service: Vascular;  Laterality: Left;  . BASCILIC VEIN TRANSPOSITION Left 08/26/2017   Procedure: BASILIC VEIN TRANSPOSITION SECOND STAGE;  Surgeon: Maeola Harman, MD;  Location: [facility] OR;  Service: Vascular;  Laterality: Left;  . CHOLECYSTECTOMY    . COLON SURGERY     for a blockage  . ESOPHAGOGASTRODUODENOSCOPY N/A  02/17/2017   Procedure: ESOPHAGOGASTRODUODENOSCOPY (EGD);  Surgeon: Sherrilyn Rist, MD;  Location: [facility] ENDOSCOPY;  Service: Endoscopy;  Laterality: N/A;  Bedside case in ICU  . EYE SURGERY     bilateral cataracts, /w IOL - 2013  . FISTULOGRAM N/A 11/05/2014   Procedure: FISTULOGRAM;  Surgeon: Larina Earthly, MD;  Location: [facility] CATH LAB;  Service: Cardiovascular;  Laterality: N/A;  . INSERTION OF DIALYSIS CATHETER  04/10/2012   Procedure: INSERTION OF DIALYSIS CATHETER;  Surgeon: Pryor Ochoa, MD;  Location: [facility] OR;  Service: Vascular;  Laterality: N/A;  Insertion Diatek Catheter Right Internal Jugular  . LIGATION OF COMPETING BRANCHES OF ARTERIOVENOUS FISTULA  11/07/2012   Procedure: LIGATION OF COMPETING BRANCHES OF ARTERIOVENOUS FISTULA;  Surgeon: Chuck Hint, MD;  Location: [facility] OR;  Service: Vascular;  Laterality: Left;  Brachio/Cephalic Fistula  . SHUNTOGRAM N/A 11/03/2012   Procedure: Betsey Amen;  Surgeon: Chuck Hint, MD;  Location: [facility] CATH LAB;  Service: Cardiovascular;  Laterality: N/A;  . SHUNTOGRAM Left 03/09/2013   Procedure: Fistulogram;  Surgeon: Fransisco Hertz, MD;  Location: [facility] CATH LAB;  Service: Cardiovascular;  Laterality: Left;  . SHUNTOGRAM Left 11/23/2013   Procedure: FISTULOGRAM;  Surgeon: Fransisco Hertz, MD;  Location: [facility] CATH LAB;  Service: Cardiovascular;  Laterality: Left;    Social History   Social History  . Marital status: Married    Spouse name: N/A  . Number of children: N/A  . Years of education: N/A   Occupational History  . Not on file.   Social History Main Topics  . Smoking status: Never Smoker  . Smokeless tobacco: Never Used  . Alcohol use No  . Drug use: No  . Sexual activity: Not on file   Other Topics Concern  . Not on file   Social History Narrative  . No narrative on file     Vitals:   08/28/17 1136  BP: 124/68  Pulse: 81  SpO2: 94%  Weight: 149 lb (67.6 kg)  Height:  (1.626 m)    Wt Readings from Last 3  Encounters:  08/28/17 149 lb (67.6 kg)  08/26/17 147 lb (66.7 kg)  08/16/17 147 lb 8 oz (66.9 kg)     PHYSICAL EXAM General: NAD HEENT: Normal. Neck: No JVD, no thyromegaly. Lungs: Clear to auscultation bilaterally with normal respiratory effort. CV: Regular rate and irregular rhythm, normal S1/S2, no S3, no murmur. No pretibial or periankle edema.   Abdomen: Soft, nontender, no distention.  Neurologic: Alert and oriented.  Psych: Normal affect. Skin: Normal. Musculoskeletal: No gross deformities.    ECG: Most recent ECG reviewed.   Labs: Lab Results  Component Value Date/Time   K 4.5 08/26/2017 09:45 AM   BUN 40 (H) 05/10/2017 12:07 PM   CREATININE 7.90 (H) 05/10/2017 12:07 PM   ALT 20 05/04/2017 06:50 AM   TSH 5.081 (H) 05/04/2017  06:48 AM   HGB 13.6 08/26/2017 09:24 AM     Lipids: Lab Results  Component Value Date/Time   TRIG 270 (H) 02/17/2017 02:30 PM       ASSESSMENT AND PLAN:  1. Chronic atrial fibrillation:Symptomatically stable on long-acting diltiazem 240 mg every morning and 120 mg every evening as well as metoprolol tartrate 25 mg twice daily. She is not on anticoagulation candidate.  2. Hypertension:Controlled.  No changes to therapy.  3. Aortic stenosis: Mild.  Will assess clinically and with surveillance echocardiography as needed.  4. Pericardial effusion: Small to medium size when last assessed on 11/09/16.      Disposition: Follow up 6 months   Prentice Docker, M.D., F.A.C.C.

## 2017-08-29 DIAGNOSIS — Z992 Dependence on renal dialysis: Secondary | ICD-10-CM | POA: Diagnosis not present

## 2017-08-29 DIAGNOSIS — N186 End stage renal disease: Secondary | ICD-10-CM | POA: Diagnosis not present

## 2017-08-31 DIAGNOSIS — Z992 Dependence on renal dialysis: Secondary | ICD-10-CM | POA: Diagnosis not present

## 2017-08-31 DIAGNOSIS — N186 End stage renal disease: Secondary | ICD-10-CM | POA: Diagnosis not present

## 2017-09-02 DIAGNOSIS — M47812 Spondylosis without myelopathy or radiculopathy, cervical region: Secondary | ICD-10-CM | POA: Diagnosis not present

## 2017-09-03 DIAGNOSIS — N186 End stage renal disease: Secondary | ICD-10-CM | POA: Diagnosis not present

## 2017-09-03 DIAGNOSIS — Z992 Dependence on renal dialysis: Secondary | ICD-10-CM | POA: Diagnosis not present

## 2017-09-04 DIAGNOSIS — Z7689 Persons encountering health services in other specified circumstances: Secondary | ICD-10-CM | POA: Diagnosis not present

## 2017-09-04 DIAGNOSIS — G894 Chronic pain syndrome: Secondary | ICD-10-CM | POA: Diagnosis not present

## 2017-09-05 DIAGNOSIS — Z992 Dependence on renal dialysis: Secondary | ICD-10-CM | POA: Diagnosis not present

## 2017-09-05 DIAGNOSIS — N186 End stage renal disease: Secondary | ICD-10-CM | POA: Diagnosis not present

## 2017-09-07 DIAGNOSIS — Z992 Dependence on renal dialysis: Secondary | ICD-10-CM | POA: Diagnosis not present

## 2017-09-07 DIAGNOSIS — N186 End stage renal disease: Secondary | ICD-10-CM | POA: Diagnosis not present

## 2017-09-10 DIAGNOSIS — Z992 Dependence on renal dialysis: Secondary | ICD-10-CM | POA: Diagnosis not present

## 2017-09-10 DIAGNOSIS — N186 End stage renal disease: Secondary | ICD-10-CM | POA: Diagnosis not present

## 2017-09-12 DIAGNOSIS — N186 End stage renal disease: Secondary | ICD-10-CM | POA: Diagnosis not present

## 2017-09-12 DIAGNOSIS — Z992 Dependence on renal dialysis: Secondary | ICD-10-CM | POA: Diagnosis not present

## 2017-09-14 DIAGNOSIS — Z992 Dependence on renal dialysis: Secondary | ICD-10-CM | POA: Diagnosis not present

## 2017-09-14 DIAGNOSIS — N186 End stage renal disease: Secondary | ICD-10-CM | POA: Diagnosis not present

## 2017-09-17 DIAGNOSIS — Z992 Dependence on renal dialysis: Secondary | ICD-10-CM | POA: Diagnosis not present

## 2017-09-17 DIAGNOSIS — N186 End stage renal disease: Secondary | ICD-10-CM | POA: Diagnosis not present

## 2017-09-19 DIAGNOSIS — Z992 Dependence on renal dialysis: Secondary | ICD-10-CM | POA: Diagnosis not present

## 2017-09-19 DIAGNOSIS — N186 End stage renal disease: Secondary | ICD-10-CM | POA: Diagnosis not present

## 2017-09-21 DIAGNOSIS — Z992 Dependence on renal dialysis: Secondary | ICD-10-CM | POA: Diagnosis not present

## 2017-09-21 DIAGNOSIS — N186 End stage renal disease: Secondary | ICD-10-CM | POA: Diagnosis not present

## 2017-09-24 DIAGNOSIS — Z992 Dependence on renal dialysis: Secondary | ICD-10-CM | POA: Diagnosis not present

## 2017-09-24 DIAGNOSIS — N186 End stage renal disease: Secondary | ICD-10-CM | POA: Diagnosis not present

## 2017-09-26 DIAGNOSIS — Z992 Dependence on renal dialysis: Secondary | ICD-10-CM | POA: Diagnosis not present

## 2017-09-26 DIAGNOSIS — N186 End stage renal disease: Secondary | ICD-10-CM | POA: Diagnosis not present

## 2017-09-27 DIAGNOSIS — N186 End stage renal disease: Secondary | ICD-10-CM | POA: Diagnosis not present

## 2017-09-27 DIAGNOSIS — Z992 Dependence on renal dialysis: Secondary | ICD-10-CM | POA: Diagnosis not present

## 2017-09-28 DIAGNOSIS — N186 End stage renal disease: Secondary | ICD-10-CM | POA: Diagnosis not present

## 2017-09-28 DIAGNOSIS — Z992 Dependence on renal dialysis: Secondary | ICD-10-CM | POA: Diagnosis not present

## 2017-10-01 DIAGNOSIS — N186 End stage renal disease: Secondary | ICD-10-CM | POA: Diagnosis not present

## 2017-10-01 DIAGNOSIS — Z992 Dependence on renal dialysis: Secondary | ICD-10-CM | POA: Diagnosis not present

## 2017-10-03 DIAGNOSIS — Z992 Dependence on renal dialysis: Secondary | ICD-10-CM | POA: Diagnosis not present

## 2017-10-03 DIAGNOSIS — N186 End stage renal disease: Secondary | ICD-10-CM | POA: Diagnosis not present

## 2017-10-05 DIAGNOSIS — Z992 Dependence on renal dialysis: Secondary | ICD-10-CM | POA: Diagnosis not present

## 2017-10-05 DIAGNOSIS — N186 End stage renal disease: Secondary | ICD-10-CM | POA: Diagnosis not present

## 2017-10-08 DIAGNOSIS — Z992 Dependence on renal dialysis: Secondary | ICD-10-CM | POA: Diagnosis not present

## 2017-10-08 DIAGNOSIS — N186 End stage renal disease: Secondary | ICD-10-CM | POA: Diagnosis not present

## 2017-10-09 ENCOUNTER — Inpatient Hospital Stay (HOSPITAL_COMMUNITY): Admit: 2017-10-09 | Payer: Medicare Other

## 2017-10-10 DIAGNOSIS — Z992 Dependence on renal dialysis: Secondary | ICD-10-CM | POA: Diagnosis not present

## 2017-10-10 DIAGNOSIS — N186 End stage renal disease: Secondary | ICD-10-CM | POA: Diagnosis not present

## 2017-10-11 ENCOUNTER — Encounter (HOSPITAL_COMMUNITY): Payer: Medicare Other

## 2017-10-11 ENCOUNTER — Ambulatory Visit (INDEPENDENT_AMBULATORY_CARE_PROVIDER_SITE_OTHER): Payer: Medicare Other | Admitting: Vascular Surgery

## 2017-10-11 ENCOUNTER — Encounter: Payer: Self-pay | Admitting: Vascular Surgery

## 2017-10-11 ENCOUNTER — Ambulatory Visit (HOSPITAL_COMMUNITY)
Admission: RE | Admit: 2017-10-11 | Discharge: 2017-10-11 | Disposition: A | Discharge status: Home / Self Care | Payer: Medicare Other | Source: Ambulatory Visit | Attending: Vascular Surgery | Admitting: Vascular Surgery

## 2017-10-11 VITALS — BP 104/58 | HR 70 | Resp 18 | Ht 64.0 in | Wt 148.5 lb

## 2017-10-11 DIAGNOSIS — Z992 Dependence on renal dialysis: Secondary | ICD-10-CM

## 2017-10-11 DIAGNOSIS — Z48812 Encounter for surgical aftercare following surgery on the circulatory system: Secondary | ICD-10-CM

## 2017-10-11 DIAGNOSIS — N186 End stage renal disease: Secondary | ICD-10-CM

## 2017-10-11 NOTE — Progress Notes (Signed)
Subjective:     Patient ID: RAKIYAH BEAMON, female   DOB: 17-May-1929, 83 y.o.   MRN: [id number]  HPI 83 year old female follows up from left second stage basilic vein transposition fistula.  She has done very well from that she is healed her incisions well although she does have some pain at the incision sites.  She does not have any pain in her hand and is functioning normally.   Review of Systems Leg swelling Incisional pain left arm    Objective:   Physical Exam [id number] Palpable thrill in left upper arm  I reviewed her left arm duplex which demonstrates an inability to obtain the flow volume in a diameter of up to 0.45 cm.  The depth is 0.32 cm in the mid arm.    Assessment/plan     83 year old female on dialysis via right IJ catheter status post left second stage basilic vein fistula.  The fistula is palpable with a strong thrill in the mid arm and is tunneled medially.  Unfortunately our duplex had difficulty obtaining flow volumes measured the size to be quite small.  Given how easily palpable this is think we should attempt to cannulate the fistula at least with one needle and see if this works.  If this is unable to happen then we will proceed with fistulogram and she does not need to follow-up prior to setting that up.  Family demonstrates good understanding and she can otherwise follow-up on a as needed basis.  Trestin Vences C. Randie Heinz, MD [facility] [place] Office: [phone] Pager: [phone]

## 2017-10-12 DIAGNOSIS — N186 End stage renal disease: Secondary | ICD-10-CM | POA: Diagnosis not present

## 2017-10-12 DIAGNOSIS — Z992 Dependence on renal dialysis: Secondary | ICD-10-CM | POA: Diagnosis not present

## 2017-10-14 DIAGNOSIS — M47812 Spondylosis without myelopathy or radiculopathy, cervical region: Secondary | ICD-10-CM | POA: Diagnosis not present

## 2017-10-15 DIAGNOSIS — Z992 Dependence on renal dialysis: Secondary | ICD-10-CM | POA: Diagnosis not present

## 2017-10-15 DIAGNOSIS — N186 End stage renal disease: Secondary | ICD-10-CM | POA: Diagnosis not present

## 2017-10-17 DIAGNOSIS — N186 End stage renal disease: Secondary | ICD-10-CM | POA: Diagnosis not present

## 2017-10-17 DIAGNOSIS — Z992 Dependence on renal dialysis: Secondary | ICD-10-CM | POA: Diagnosis not present

## 2017-10-19 DIAGNOSIS — N186 End stage renal disease: Secondary | ICD-10-CM | POA: Diagnosis not present

## 2017-10-19 DIAGNOSIS — Z992 Dependence on renal dialysis: Secondary | ICD-10-CM | POA: Diagnosis not present

## 2017-10-21 DIAGNOSIS — Z992 Dependence on renal dialysis: Secondary | ICD-10-CM | POA: Diagnosis not present

## 2017-10-21 DIAGNOSIS — N186 End stage renal disease: Secondary | ICD-10-CM | POA: Diagnosis not present

## 2017-10-24 DIAGNOSIS — N186 End stage renal disease: Secondary | ICD-10-CM | POA: Diagnosis not present

## 2017-10-24 DIAGNOSIS — Z992 Dependence on renal dialysis: Secondary | ICD-10-CM | POA: Diagnosis not present

## 2017-10-26 DIAGNOSIS — Z992 Dependence on renal dialysis: Secondary | ICD-10-CM | POA: Diagnosis not present

## 2017-10-26 DIAGNOSIS — N186 End stage renal disease: Secondary | ICD-10-CM | POA: Diagnosis not present

## 2017-10-28 DIAGNOSIS — N186 End stage renal disease: Secondary | ICD-10-CM | POA: Diagnosis not present

## 2017-10-28 DIAGNOSIS — Z992 Dependence on renal dialysis: Secondary | ICD-10-CM | POA: Diagnosis not present

## 2017-10-29 DIAGNOSIS — N186 End stage renal disease: Secondary | ICD-10-CM | POA: Diagnosis not present

## 2017-10-29 DIAGNOSIS — Z992 Dependence on renal dialysis: Secondary | ICD-10-CM | POA: Diagnosis not present

## 2017-10-31 DIAGNOSIS — N186 End stage renal disease: Secondary | ICD-10-CM | POA: Diagnosis not present

## 2017-10-31 DIAGNOSIS — Z992 Dependence on renal dialysis: Secondary | ICD-10-CM | POA: Diagnosis not present

## 2017-11-02 DIAGNOSIS — Z992 Dependence on renal dialysis: Secondary | ICD-10-CM | POA: Diagnosis not present

## 2017-11-02 DIAGNOSIS — N186 End stage renal disease: Secondary | ICD-10-CM | POA: Diagnosis not present

## 2017-11-05 DIAGNOSIS — Z992 Dependence on renal dialysis: Secondary | ICD-10-CM | POA: Diagnosis not present

## 2017-11-05 DIAGNOSIS — N186 End stage renal disease: Secondary | ICD-10-CM | POA: Diagnosis not present

## 2017-11-07 DIAGNOSIS — N186 End stage renal disease: Secondary | ICD-10-CM | POA: Diagnosis not present

## 2017-11-07 DIAGNOSIS — Z992 Dependence on renal dialysis: Secondary | ICD-10-CM | POA: Diagnosis not present

## 2017-11-07 NOTE — H&P (View-Only) (Signed)
Postoperative Access Visit   History of Present Illness   Patricia Ayala is a 84 y.o. year old female who presents for postoperative follow-up for: left second stage basilic vein transposition by Dr. Randie Heinz (Date: 08/26/17).  Patient is referred back to [place] for difficulty with cannulation.  The patient's wounds are healed.  The patient notes no steal symptoms.  The patient is able to complete their activities of daily living.  The patient's current symptoms are: none.  Reportedly significant infiltration previously.   Past Medical History:  Diagnosis Date  . Anemia 02/11/2012  . Anxiety   . Aortic stenosis    mild AS 10/2016 echo  . Arthritis    arthritis in neck  . Atrial fibrillation (HCC)   . CHF (congestive heart failure) (HCC)   . Chronic kidney disease    hemodialysis Tu/Th/Sa  . Dyspnea    on exertion  . GERD (gastroesophageal reflux disease)   . H/O metabolic acidosis   . Headache   . Hyperlipidemia   . Hyperparathyroidism, secondary (HCC)   . Hypertension    Dr. Regino Schultze, [place], [place]  . Oxygen dependent   . Peptic ulcer   . Peripheral vascular disease (HCC)   . Vitamin D deficiency     Past Surgical History:  Procedure Laterality Date  . A/V FISTULAGRAM N/A 05/10/2017   Procedure: A/V Fistulagram;  Surgeon: Sherren Kerns, MD;  Location: [facility] INVASIVE CV LAB;  Service: Cardiovascular;  Laterality: N/A;  . ABDOMINAL HYSTERECTOMY    . AV FISTULA PLACEMENT  04/01/2012   Procedure: ARTERIOVENOUS (AV) FISTULA CREATION;  Surgeon: Sherren Kerns, MD;  Location: [facility] OR;  Service: Vascular;  Laterality: Left;  . AV FISTULA PLACEMENT Left 06/24/2017   Procedure: ARTERIOVENOUS (AV) FISTULA CREATION;  Surgeon: Maeola Harman, MD;  Location: [facility] OR;  Service: Vascular;  Laterality: Left;  . BASCILIC VEIN TRANSPOSITION Left 08/26/2017   Procedure: BASILIC VEIN TRANSPOSITION SECOND STAGE;  Surgeon: Maeola Harman, MD;  Location: [facility] OR;  Service: Vascular;   Laterality: Left;  . CHOLECYSTECTOMY    . COLON SURGERY     for a blockage  . ESOPHAGOGASTRODUODENOSCOPY N/A 02/17/2017   Procedure: ESOPHAGOGASTRODUODENOSCOPY (EGD);  Surgeon: Sherrilyn Rist, MD;  Location: [facility] ENDOSCOPY;  Service: Endoscopy;  Laterality: N/A;  Bedside case in ICU  . EYE SURGERY     bilateral cataracts, /w IOL - 2013  . FISTULOGRAM N/A 11/05/2014   Procedure: FISTULOGRAM;  Surgeon: Larina Earthly, MD;  Location: [facility] CATH LAB;  Service: Cardiovascular;  Laterality: N/A;  . INSERTION OF DIALYSIS CATHETER  04/10/2012   Procedure: INSERTION OF DIALYSIS CATHETER;  Surgeon: Pryor Ochoa, MD;  Location: [facility] OR;  Service: Vascular;  Laterality: N/A;  Insertion Diatek Catheter Right Internal Jugular  . LIGATION OF COMPETING BRANCHES OF ARTERIOVENOUS FISTULA  11/07/2012   Procedure: LIGATION OF COMPETING BRANCHES OF ARTERIOVENOUS FISTULA;  Surgeon: Chuck Hint, MD;  Location: [facility] OR;  Service: Vascular;  Laterality: Left;  Brachio/Cephalic Fistula  . SHUNTOGRAM N/A 11/03/2012   Procedure: Betsey Amen;  Surgeon: Chuck Hint, MD;  Location: [facility] CATH LAB;  Service: Cardiovascular;  Laterality: N/A;  . SHUNTOGRAM Left 03/09/2013   Procedure: Fistulogram;  Surgeon: Fransisco Hertz, MD;  Location: [facility] CATH LAB;  Service: Cardiovascular;  Laterality: Left;  . SHUNTOGRAM Left 11/23/2013   Procedure: FISTULOGRAM;  Surgeon: Fransisco Hertz, MD;  Location: [facility] CATH LAB;  Service: Cardiovascular;  Laterality: Left;  Social History   Socioeconomic History  . Marital status: Married    Spouse name: Not on file  . Number of children: Not on file  . Years of education: Not on file  . Highest education level: Not on file  Social Needs  . Financial resource strain: Not on file  . Food insecurity - worry: Not on file  . Food insecurity - inability: Not on file  . Transportation needs - medical: Not on file  . Transportation needs - non-medical: Not on file  Occupational History  . Not on  file  Tobacco Use  . Smoking status: Never Smoker  . Smokeless tobacco: Never Used  Substance and Sexual Activity  . Alcohol use: No  . Drug use: No  . Sexual activity: Not on file  Other Topics Concern  . Not on file  Social History Narrative  . Not on file    Family History  Problem Relation Age of Onset  . Stroke Mother   . Cancer Father   . Heart attack Father     Current Outpatient Medications  Medication Sig Dispense Refill  . acetaminophen (TYLENOL) 325 MG tablet Take 650 mg by mouth every 4 (four) hours as needed.    . ALPRAZolam (XANAX) 0.5 MG tablet Take 1 tablet by mouth 3 (three) times daily as needed for anxiety.   0  . cinacalcet (SENSIPAR) 30 MG tablet Take 30 mg by mouth daily.    Marland Kitchen diltiazem (CARDIZEM CD) 360 MG 24 hr capsule Take 1 capsule (360 mg total) by mouth daily. 360 capsule 1  . HYDROcodone-acetaminophen (NORCO) 10-325 MG tablet Take 1 tablet by mouth every 6 (six) hours as needed for moderate pain.    . metoprolol tartrate (LOPRESSOR) 25 MG tablet Take 1 tablet (25 mg total) by mouth 2 (two) times daily. 180 tablet 3  . multivitamin (RENA-VIT) TABS tablet Take 1 tablet by mouth daily.    Marland Kitchen oxyCODONE-acetaminophen (ROXICET) 5-325 MG tablet Take 1 tablet by mouth every 6 (six) hours as needed. 10 tablet 0  . pantoprazole (PROTONIX) 40 MG tablet Take 1 tablet (40 mg total) by mouth 2 (two) times daily. 30 tablet 0  . predniSONE (DELTASONE) 10 MG tablet     . promethazine (PHENERGAN) 25 MG tablet Take 25 mg by mouth every 6 (six) hours as needed for nausea or vomiting.    . sevelamer carbonate (RENVELA) 800 MG tablet Take 1,600 mg by mouth 3 (three) times daily with meals. Take 800mg  with snacks daily.      No current facility-administered medications for this visit.      No Known Allergies  REVIEW OF SYSTEMS (negative unless checked):   Cardiac:  []  Chest pain or chest pressure? []  Shortness of breath upon activity? []  Shortness of breath when  lying flat? []  Irregular heart rhythm?  Vascular:  []  Pain in calf, thigh, or hip brought on by walking? []  Pain in feet at night that wakes you up from your sleep? []  Blood clot in your veins? []  Leg swelling?  Pulmonary:  []  Oxygen at home? []  Productive cough? []  Wheezing?  Neurologic:  []  Sudden weakness in arms or legs? []  Sudden numbness in arms or legs? []  Sudden onset of difficult speaking or slurred speech? []  Temporary loss of vision in one eye? []  Problems with dizziness?  Gastrointestinal:  []  Blood in stool? []  Vomited blood?  Genitourinary:  []  Burning when urinating? []  Blood in urine?  Psychiatric:  []  Major  depression  Hematologic:  []  Bleeding problems? []  Problems with blood clotting? [x]   End stage renal disease-HD: T/R/S   Dermatologic:  []  Rashes or ulcers?  Constitutional:  []  Fever or chills?  Ear/Nose/Throat:  []  Change in hearing? []  Nose bleeds? []  Sore throat?  Musculoskeletal:  []  Back pain? []  Joint pain? []  Muscle pain?   Physical Examination   Vitals:   11/08/17 1143  BP: 107/64  Pulse: 80  Resp: 20  Temp: 98.1 F (36.7 C)  SpO2: 97%  Weight: 150 lb (68 kg)  Height: 5\' 4"  (1.626 m)   Body mass index is 25.75 kg/m.  Pulmonary Sym exp, good air movt, CTAB, no rales, rhonchi, & wheezing  Cardiac RRR, Nl S1, S2, no Murmurs, rubs or gallops   left arm Incisions are healed, skin feels warm, hand grip is 5/5, sensation in digits is intact, non-palpable thrill, bruit can be weakly auscultated     Medical Decision Making   Patricia Ayala is a 84 y.o. year old female who presents s/p left second stage basilic vein transposition   L BVT is in the process of thrombosing based on exam.  Will proceed with L arm fistulogram, possible intervention to try to salvage. I discussed with the patient the nature of angiographic procedures, especially the limited patencies of any endovascular intervention.   The patient is  aware of that the risks of an angiographic procedure include but are not limited to: bleeding, infection, access site complications, renal failure, embolization, rupture of vessel, dissection, arteriovenous fistula, possible need for emergent surgical intervention, possible need for surgical procedures to treat the patient's pathology, anaphylactic reaction to contrast, and stroke and death.   The patient is aware of the risks and agrees to proceed.  She is scheduled for 21 JAN 19  I offered the patient one of my partners to expedite the evaluation, but pt and husband prefer I complete the procedure.  Thank you for allowing [place] to participate in this patient's care.   Leonides Sake, MD, FACS [facility] [place] Office: [phone] Pager: [phone]

## 2017-11-07 NOTE — Progress Notes (Signed)
Postoperative Access Visit   History of Present Illness   Patricia Ayala is a 84 y.o. year old female who presents for postoperative follow-up for: left second stage basilic vein transposition by Dr. Randie Heinz (Date: 08/26/17).  Patient is referred back to [place] for difficulty with cannulation.  The patient's wounds are healed.  The patient notes no steal symptoms.  The patient is able to complete their activities of daily living.  The patient's current symptoms are: none.  Reportedly significant infiltration previously.   Past Medical History:  Diagnosis Date  . Anemia 02/11/2012  . Anxiety   . Aortic stenosis    mild AS 10/2016 echo  . Arthritis    arthritis in neck  . Atrial fibrillation (HCC)   . CHF (congestive heart failure) (HCC)   . Chronic kidney disease    hemodialysis Tu/Th/Sa  . Dyspnea    on exertion  . GERD (gastroesophageal reflux disease)   . H/O metabolic acidosis   . Headache   . Hyperlipidemia   . Hyperparathyroidism, secondary (HCC)   . Hypertension    Dr. Regino Schultze, [place], [place]  . Oxygen dependent   . Peptic ulcer   . Peripheral vascular disease (HCC)   . Vitamin D deficiency     Past Surgical History:  Procedure Laterality Date  . A/V FISTULAGRAM N/A 05/10/2017   Procedure: A/V Fistulagram;  Surgeon: Sherren Kerns, MD;  Location: [facility] INVASIVE CV LAB;  Service: Cardiovascular;  Laterality: N/A;  . ABDOMINAL HYSTERECTOMY    . AV FISTULA PLACEMENT  04/01/2012   Procedure: ARTERIOVENOUS (AV) FISTULA CREATION;  Surgeon: Sherren Kerns, MD;  Location: [facility] OR;  Service: Vascular;  Laterality: Left;  . AV FISTULA PLACEMENT Left 06/24/2017   Procedure: ARTERIOVENOUS (AV) FISTULA CREATION;  Surgeon: Maeola Harman, MD;  Location: [facility] OR;  Service: Vascular;  Laterality: Left;  . BASCILIC VEIN TRANSPOSITION Left 08/26/2017   Procedure: BASILIC VEIN TRANSPOSITION SECOND STAGE;  Surgeon: Maeola Harman, MD;  Location: [facility] OR;  Service: Vascular;   Laterality: Left;  . CHOLECYSTECTOMY    . COLON SURGERY     for a blockage  . ESOPHAGOGASTRODUODENOSCOPY N/A 02/17/2017   Procedure: ESOPHAGOGASTRODUODENOSCOPY (EGD);  Surgeon: Sherrilyn Rist, MD;  Location: [facility] ENDOSCOPY;  Service: Endoscopy;  Laterality: N/A;  Bedside case in ICU  . EYE SURGERY     bilateral cataracts, /w IOL - 2013  . FISTULOGRAM N/A 11/05/2014   Procedure: FISTULOGRAM;  Surgeon: Larina Earthly, MD;  Location: [facility] CATH LAB;  Service: Cardiovascular;  Laterality: N/A;  . INSERTION OF DIALYSIS CATHETER  04/10/2012   Procedure: INSERTION OF DIALYSIS CATHETER;  Surgeon: Pryor Ochoa, MD;  Location: [facility] OR;  Service: Vascular;  Laterality: N/A;  Insertion Diatek Catheter Right Internal Jugular  . LIGATION OF COMPETING BRANCHES OF ARTERIOVENOUS FISTULA  11/07/2012   Procedure: LIGATION OF COMPETING BRANCHES OF ARTERIOVENOUS FISTULA;  Surgeon: Chuck Hint, MD;  Location: [facility] OR;  Service: Vascular;  Laterality: Left;  Brachio/Cephalic Fistula  . SHUNTOGRAM N/A 11/03/2012   Procedure: Betsey Amen;  Surgeon: Chuck Hint, MD;  Location: [facility] CATH LAB;  Service: Cardiovascular;  Laterality: N/A;  . SHUNTOGRAM Left 03/09/2013   Procedure: Fistulogram;  Surgeon: Fransisco Hertz, MD;  Location: [facility] CATH LAB;  Service: Cardiovascular;  Laterality: Left;  . SHUNTOGRAM Left 11/23/2013   Procedure: FISTULOGRAM;  Surgeon: Fransisco Hertz, MD;  Location: [facility] CATH LAB;  Service: Cardiovascular;  Laterality: Left;  Social History   Socioeconomic History  . Marital status: Married    Spouse name: Not on file  . Number of children: Not on file  . Years of education: Not on file  . Highest education level: Not on file  Social Needs  . Financial resource strain: Not on file  . Food insecurity - worry: Not on file  . Food insecurity - inability: Not on file  . Transportation needs - medical: Not on file  . Transportation needs - non-medical: Not on file  Occupational History  . Not on  file  Tobacco Use  . Smoking status: Never Smoker  . Smokeless tobacco: Never Used  Substance and Sexual Activity  . Alcohol use: No  . Drug use: No  . Sexual activity: Not on file  Other Topics Concern  . Not on file  Social History Narrative  . Not on file    Family History  Problem Relation Age of Onset  . Stroke Mother   . Cancer Father   . Heart attack Father     Current Outpatient Medications  Medication Sig Dispense Refill  . acetaminophen (TYLENOL) 325 MG tablet Take 650 mg by mouth every 4 (four) hours as needed.    . ALPRAZolam (XANAX) 0.5 MG tablet Take 1 tablet by mouth 3 (three) times daily as needed for anxiety.   0  . cinacalcet (SENSIPAR) 30 MG tablet Take 30 mg by mouth daily.    Marland Kitchen diltiazem (CARDIZEM CD) 360 MG 24 hr capsule Take 1 capsule (360 mg total) by mouth daily. 360 capsule 1  . HYDROcodone-acetaminophen (NORCO) 10-325 MG tablet Take 1 tablet by mouth every 6 (six) hours as needed for moderate pain.    . metoprolol tartrate (LOPRESSOR) 25 MG tablet Take 1 tablet (25 mg total) by mouth 2 (two) times daily. 180 tablet 3  . multivitamin (RENA-VIT) TABS tablet Take 1 tablet by mouth daily.    Marland Kitchen oxyCODONE-acetaminophen (ROXICET) 5-325 MG tablet Take 1 tablet by mouth every 6 (six) hours as needed. 10 tablet 0  . pantoprazole (PROTONIX) 40 MG tablet Take 1 tablet (40 mg total) by mouth 2 (two) times daily. 30 tablet 0  . predniSONE (DELTASONE) 10 MG tablet     . promethazine (PHENERGAN) 25 MG tablet Take 25 mg by mouth every 6 (six) hours as needed for nausea or vomiting.    . sevelamer carbonate (RENVELA) 800 MG tablet Take 1,600 mg by mouth 3 (three) times daily with meals. Take 800mg  with snacks daily.      No current facility-administered medications for this visit.      No Known Allergies  REVIEW OF SYSTEMS (negative unless checked):   Cardiac:  []  Chest pain or chest pressure? []  Shortness of breath upon activity? []  Shortness of breath when  lying flat? []  Irregular heart rhythm?  Vascular:  []  Pain in calf, thigh, or hip brought on by walking? []  Pain in feet at night that wakes you up from your sleep? []  Blood clot in your veins? []  Leg swelling?  Pulmonary:  []  Oxygen at home? []  Productive cough? []  Wheezing?  Neurologic:  []  Sudden weakness in arms or legs? []  Sudden numbness in arms or legs? []  Sudden onset of difficult speaking or slurred speech? []  Temporary loss of vision in one eye? []  Problems with dizziness?  Gastrointestinal:  []  Blood in stool? []  Vomited blood?  Genitourinary:  []  Burning when urinating? []  Blood in urine?  Psychiatric:  []  Major  depression  Hematologic:  []  Bleeding problems? []  Problems with blood clotting? [x]   End stage renal disease-HD: T/R/S   Dermatologic:  []  Rashes or ulcers?  Constitutional:  []  Fever or chills?  Ear/Nose/Throat:  []  Change in hearing? []  Nose bleeds? []  Sore throat?  Musculoskeletal:  []  Back pain? []  Joint pain? []  Muscle pain?   Physical Examination   Vitals:   11/08/17 1143  BP: 107/64  Pulse: 80  Resp: 20  Temp: 98.1 F (36.7 C)  SpO2: 97%  Weight: 150 lb (68 kg)  Height: 5\' 4"  (1.626 m)   Body mass index is 25.75 kg/m.  Pulmonary Sym exp, good air movt, CTAB, no rales, rhonchi, & wheezing  Cardiac RRR, Nl S1, S2, no Murmurs, rubs or gallops   left arm Incisions are healed, skin feels warm, hand grip is 5/5, sensation in digits is intact, non-palpable thrill, bruit can be weakly auscultated     Medical Decision Making   Patricia Ayala is a 84 y.o. year old female who presents s/p left second stage basilic vein transposition   L BVT is in the process of thrombosing based on exam.  Will proceed with L arm fistulogram, possible intervention to try to salvage. I discussed with the patient the nature of angiographic procedures, especially the limited patencies of any endovascular intervention.   The patient is  aware of that the risks of an angiographic procedure include but are not limited to: bleeding, infection, access site complications, renal failure, embolization, rupture of vessel, dissection, arteriovenous fistula, possible need for emergent surgical intervention, possible need for surgical procedures to treat the patient's pathology, anaphylactic reaction to contrast, and stroke and death.   The patient is aware of the risks and agrees to proceed.  She is scheduled for 21 JAN 19  I offered the patient one of my partners to expedite the evaluation, but pt and husband prefer I complete the procedure.  Thank you for allowing [place] to participate in this patient's care.   Leonides Sake, MD, FACS [facility] [place] Office: [phone] Pager: [phone]

## 2017-11-08 ENCOUNTER — Encounter: Payer: Self-pay | Admitting: Vascular Surgery

## 2017-11-08 ENCOUNTER — Encounter: Payer: Self-pay | Admitting: *Deleted

## 2017-11-08 ENCOUNTER — Other Ambulatory Visit: Payer: Self-pay | Admitting: *Deleted

## 2017-11-08 ENCOUNTER — Ambulatory Visit (INDEPENDENT_AMBULATORY_CARE_PROVIDER_SITE_OTHER): Payer: Medicare Other | Admitting: Vascular Surgery

## 2017-11-08 VITALS — BP 107/64 | HR 80 | Temp 98.1°F | Resp 20 | Ht 64.0 in | Wt 150.0 lb

## 2017-11-08 DIAGNOSIS — N186 End stage renal disease: Secondary | ICD-10-CM

## 2017-11-08 DIAGNOSIS — Z992 Dependence on renal dialysis: Secondary | ICD-10-CM

## 2017-11-09 DIAGNOSIS — N186 End stage renal disease: Secondary | ICD-10-CM | POA: Diagnosis not present

## 2017-11-09 DIAGNOSIS — Z992 Dependence on renal dialysis: Secondary | ICD-10-CM | POA: Diagnosis not present

## 2017-11-12 DIAGNOSIS — N186 End stage renal disease: Secondary | ICD-10-CM | POA: Diagnosis not present

## 2017-11-12 DIAGNOSIS — Z992 Dependence on renal dialysis: Secondary | ICD-10-CM | POA: Diagnosis not present

## 2017-11-14 DIAGNOSIS — N186 End stage renal disease: Secondary | ICD-10-CM | POA: Diagnosis not present

## 2017-11-14 DIAGNOSIS — Z992 Dependence on renal dialysis: Secondary | ICD-10-CM | POA: Diagnosis not present

## 2017-11-16 DIAGNOSIS — N186 End stage renal disease: Secondary | ICD-10-CM | POA: Diagnosis not present

## 2017-11-16 DIAGNOSIS — Z992 Dependence on renal dialysis: Secondary | ICD-10-CM | POA: Diagnosis not present

## 2017-11-18 ENCOUNTER — Other Ambulatory Visit: Payer: Self-pay | Admitting: *Deleted

## 2017-11-18 ENCOUNTER — Ambulatory Visit (HOSPITAL_COMMUNITY)
Admission: RE | Admit: 2017-11-18 | Discharge: 2017-11-18 | Disposition: A | Discharge status: Home / Self Care | Payer: Medicare Other | Source: Ambulatory Visit | Attending: Vascular Surgery | Admitting: Vascular Surgery

## 2017-11-18 ENCOUNTER — Ambulatory Visit (HOSPITAL_COMMUNITY)
Admission: RE | Disposition: A | Discharge status: Home / Self Care | Payer: Self-pay | Source: Ambulatory Visit | Attending: Vascular Surgery

## 2017-11-18 DIAGNOSIS — N186 End stage renal disease: Secondary | ICD-10-CM | POA: Diagnosis not present

## 2017-11-18 DIAGNOSIS — Z992 Dependence on renal dialysis: Secondary | ICD-10-CM | POA: Insufficient documentation

## 2017-11-18 DIAGNOSIS — I739 Peripheral vascular disease, unspecified: Secondary | ICD-10-CM | POA: Diagnosis not present

## 2017-11-18 DIAGNOSIS — I350 Nonrheumatic aortic (valve) stenosis: Secondary | ICD-10-CM | POA: Insufficient documentation

## 2017-11-18 DIAGNOSIS — E785 Hyperlipidemia, unspecified: Secondary | ICD-10-CM | POA: Diagnosis not present

## 2017-11-18 DIAGNOSIS — M1990 Unspecified osteoarthritis, unspecified site: Secondary | ICD-10-CM | POA: Diagnosis not present

## 2017-11-18 DIAGNOSIS — N2581 Secondary hyperparathyroidism of renal origin: Secondary | ICD-10-CM | POA: Insufficient documentation

## 2017-11-18 DIAGNOSIS — Z79899 Other long term (current) drug therapy: Secondary | ICD-10-CM | POA: Diagnosis not present

## 2017-11-18 DIAGNOSIS — Y832 Surgical operation with anastomosis, bypass or graft as the cause of abnormal reaction of the patient, or of later complication, without mention of misadventure at the time of the procedure: Secondary | ICD-10-CM | POA: Diagnosis not present

## 2017-11-18 DIAGNOSIS — Z9981 Dependence on supplemental oxygen: Secondary | ICD-10-CM | POA: Diagnosis not present

## 2017-11-18 DIAGNOSIS — I132 Hypertensive heart and chronic kidney disease with heart failure and with stage 5 chronic kidney disease, or end stage renal disease: Secondary | ICD-10-CM | POA: Diagnosis not present

## 2017-11-18 DIAGNOSIS — T82868A Thrombosis of vascular prosthetic devices, implants and grafts, initial encounter: Secondary | ICD-10-CM | POA: Insufficient documentation

## 2017-11-18 DIAGNOSIS — I509 Heart failure, unspecified: Secondary | ICD-10-CM | POA: Diagnosis not present

## 2017-11-18 DIAGNOSIS — K219 Gastro-esophageal reflux disease without esophagitis: Secondary | ICD-10-CM | POA: Insufficient documentation

## 2017-11-18 DIAGNOSIS — T82858A Stenosis of vascular prosthetic devices, implants and grafts, initial encounter: Secondary | ICD-10-CM | POA: Diagnosis not present

## 2017-11-18 HISTORY — PX: A/V FISTULAGRAM: CATH118298

## 2017-11-18 LAB — POCT I-STAT, CHEM 8
BUN: 28 mg/dL — AB (ref 6–20)
CHLORIDE: 100 mmol/L — AB (ref 101–111)
CREATININE: 6 mg/dL — AB (ref 0.44–1.00)
Calcium, Ion: 1.28 mmol/L (ref 1.15–1.40)
Glucose, Bld: 88 mg/dL (ref 65–99)
HEMATOCRIT: 45 % (ref 36.0–46.0)
Hemoglobin: 15.3 g/dL — ABNORMAL HIGH (ref 12.0–15.0)
Potassium: 3.9 mmol/L (ref 3.5–5.1)
SODIUM: 140 mmol/L (ref 135–145)
TCO2: 29 mmol/L (ref 22–32)

## 2017-11-18 SURGERY — A/V FISTULAGRAM
Anesthesia: LOCAL | Laterality: Left

## 2017-11-18 MED ORDER — LIDOCAINE HCL (PF) 1 % IJ SOLN
INTRAMUSCULAR | Status: DC | PRN
  Administered 2017-11-18: 5 mL

## 2017-11-18 MED ORDER — HEPARIN (PORCINE) IN NACL 2-0.9 UNIT/ML-% IJ SOLN
INTRAMUSCULAR | Status: AC | PRN
  Administered 2017-11-18: 500 mL

## 2017-11-18 MED ORDER — HYDRALAZINE HCL 20 MG/ML IJ SOLN: 5.0000 mg | INTRAMUSCULAR | Status: DC | PRN

## 2017-11-18 MED ORDER — IODIXANOL 320 MG/ML IV SOLN
INTRAVENOUS | Status: DC | PRN
  Administered 2017-11-18: 10 mL via INTRAVENOUS

## 2017-11-18 MED ORDER — HEPARIN (PORCINE) IN NACL 2-0.9 UNIT/ML-% IJ SOLN
INTRAMUSCULAR | Status: AC
  Filled 2017-11-18: qty 500

## 2017-11-18 MED ORDER — LABETALOL HCL 5 MG/ML IV SOLN: 10.0000 mg | INTRAVENOUS | Status: DC | PRN

## 2017-11-18 MED ORDER — SODIUM CHLORIDE 0.9% FLUSH: 3.0000 mL | Freq: Two times a day (BID) | INTRAVENOUS | Status: DC

## 2017-11-18 MED ORDER — SODIUM CHLORIDE 0.9% FLUSH: 3.0000 mL | INTRAVENOUS | Status: DC | PRN

## 2017-11-18 MED ORDER — LIDOCAINE HCL 1 % IJ SOLN
INTRAMUSCULAR | Status: AC
  Filled 2017-11-18: qty 20

## 2017-11-18 MED ORDER — SODIUM CHLORIDE 0.9 % IV SOLN: 250.0000 mL | INTRAVENOUS | Status: DC | PRN

## 2017-11-18 SURGICAL SUPPLY — 11 items
BAG SNAP BAND KOVER 36X36 (MISCELLANEOUS) ×2 IMPLANT
COVER DOME SNAP 22 D (MISCELLANEOUS) ×2 IMPLANT
COVER PRB 48X5XTLSCP FOLD TPE (BAG) ×1 IMPLANT
COVER PROBE 5X48 (BAG) ×1
KIT MICROINTRODUCER STIFF 5F (SHEATH) ×2 IMPLANT
PROTECTION STATION PRESSURIZED (MISCELLANEOUS) ×2
STATION PROTECTION PRESSURIZED (MISCELLANEOUS) ×1 IMPLANT
STOPCOCK MORSE 400PSI 3WAY (MISCELLANEOUS) ×2 IMPLANT
TRAY PV CATH (CUSTOM PROCEDURE TRAY) ×2 IMPLANT
TUBING CIL FLEX 10 FLL-RA (TUBING) ×2 IMPLANT
WIRE TORQFLEX AUST .018X40CM (WIRE) ×2 IMPLANT

## 2017-11-18 NOTE — Interval H&P Note (Signed)
   History and Physical Update  The patient was interviewed and re-examined.  The patient's previous History and Physical has been reviewed and is unchanged from my consult.  There is no change in the plan of care: L arm fistulogram, possible intervention.   I discussed with the patient the nature of angiographic procedures, especially the limited patencies of any endovascular intervention.    The patient is aware of that the risks of an angiographic procedure include but are not limited to: bleeding, infection, access site complications, renal failure, embolization, rupture of vessel, dissection, arteriovenous fistula, possible need for emergent surgical intervention, possible need for surgical procedures to treat the patient's pathology, anaphylactic reaction to contrast, and stroke and death.    The patient is aware of the risks and agrees to proceed.   Leonides Sake, MD, FACS [facility] [place] Office: [phone] Pager: [phone]  11/18/2017, 9:56 AM

## 2017-11-18 NOTE — Progress Notes (Signed)
Unable to get patient on phone.  For notification of surgery.

## 2017-11-18 NOTE — Op Note (Signed)
    OPERATIVE NOTE   PROCEDURE: 1.  left radial artery cannulation under ultrasound guidance 2.  left arm shuntogram  PRE-OPERATIVE DIAGNOSIS: left basilic vein transposition with imminent thrombosis   POST-OPERATIVE DIAGNOSIS: same as above   SURGEON: Leonides Sake, MD  ANESTHESIA: local  ESTIMATED BLOOD LOSS: 50 cc  FINDING(S): 1. Thrombosis of basilic vein transposition ~2-3 cm distal to anastomosis with sub-total occlusion just distal to anastomosis 2. High brachial artery bifurcation in axilla  SPECIMEN(S):  None  CONTRAST: 10 cc   INDICATIONS: Patricia Ayala is a 84 y.o. female who presents with left basilic vein transposition with imminent thrombosis.  The patient is scheduled for left arm shuntogram.  The patient is aware the risks include but are not limited to: bleeding, infection, thrombosis of the cannulated access, and possible anaphylactic reaction to the contrast.  The patient is aware of the risks of the procedure and elects to proceed forward.   DESCRIPTION: After full informed written consent was obtained, the patient was brought back to the angiography suite and placed supine upon the angiography table.  The patient was connected to monitoring equipment.  The left upper arm was prepped and draped in the standard fashion for a left arm shuntogram.  Under ultrasound guidance, the left basilic vein transposition was cannulated with a micropuncture needle.  The microwire would not advance, curling in the fistula, suggesting occlusion vs high grade stenosis.  I pulled the wire and needle and held pressure.  Under ultrasound guidance, I cannulated what I thought with the fistula more proximal to the likely occlusion.  The microwire was advanced into the fistula and the needle was exchanged for the a microsheath, which was lodged 2 cm into the access.  The wire was removed and the sheath was connected to the IV extension tubing.  Hand injection was completed which  demonstrated high arterial bifurcation at the left axilla and likely radial artery cannulation.  This also demonstrated occlusion of the fistula 2-3 cm distal to the anastomosis.  There was a sub-total occlusion of the anastomosis, so felt percutaneous methods would be unlikely to salvage this fistula.  I pulled the micro-sheath and held pressure for 15 minutes.  A sterile bandage was applied to the puncture site.  Based on the images: new permanent access will need to be placed in left arm.   COMPLICATIONS: none  CONDITION: stable   Leonides Sake, MD, [facility] [place] Office: [phone] Pager: [phone]  11/18/2017 1:43 PM

## 2017-11-18 NOTE — Interval H&P Note (Signed)
   History and Physical Update  Fistula no longer has bruit.  It already was lacking in thrill.  Will try to attempt percutaneous thrombectomy LUA AVF.   Leonides Sake, MD, FACS [facility] [place] Office: [phone] Pager: [phone]  11/18/2017, 12:44 PM

## 2017-11-18 NOTE — Discharge Instructions (Signed)

## 2017-11-19 ENCOUNTER — Encounter (HOSPITAL_COMMUNITY): Payer: Self-pay | Admitting: Vascular Surgery

## 2017-11-19 ENCOUNTER — Encounter (HOSPITAL_COMMUNITY): Payer: Self-pay | Admitting: Emergency Medicine

## 2017-11-19 ENCOUNTER — Telehealth: Payer: Self-pay | Admitting: *Deleted

## 2017-11-19 DIAGNOSIS — Z992 Dependence on renal dialysis: Secondary | ICD-10-CM | POA: Diagnosis not present

## 2017-11-19 DIAGNOSIS — N186 End stage renal disease: Secondary | ICD-10-CM | POA: Diagnosis not present

## 2017-11-19 NOTE — Progress Notes (Signed)
Spoke with pt's husband, Ivar Drape for pre-op call he denies that patient has had chest pain or shortness of breath. Pt was napping. Pt has hx of A-fib, followed by Dr. Purvis Sheffield.

## 2017-11-19 NOTE — Telephone Encounter (Signed)
Call and spoke with patient 's husband. Instructed to be at [facility] admitting department at 8 am on 11/20/16. Expect call from the hospital and  [place] night prior and to follow the detailed instructions they receive from the hospital pre-admission testing department about this surgery.Left number to call me back if any questions.Verbalized understanding.

## 2017-11-19 NOTE — Progress Notes (Addendum)
Anesthesia Chart Review: SAME DAY WORK-UP.  Patient is a 84 year old female scheduled for insertion of left arm AVGG on 11/20/17 by Dr. Leonides Sake. She is s/p first stage left basilic vein transposition 06/24/17 and second stage 08/16/17. She was re-evaluated by vascular surgery on 11/08/17 due to difficulty with cannulation, and unfortunately fistulogram on 11/18/17 showed thrombosis of basilic vein transposition with sub-total occlusion just distal to anastomosis.  History includes never smoker, end-stage renal disease (dialysis TTS), atrial fibrillation, hypertension, GERD, hyperlipidemia, CHF, mild AS (10/2016 echo), oxygen dependent, PVD (left SFA/popliteal artery occlusion by U/S 09/2016), exertional dyspnea, anemia, hysterectomy, cholecystectomy, colon surgery, chronic non-occlusive left popliteal/femoral DVT (11/06/16 U/S). - Admitted 05/04/17-05/08/17 for acute delirium, felt related to narcotics. She also had afib with RVR (known afib history). She was treated with diltiazem and metoprolol. She is not a candidate for anticoagulation due to major GI bleed.  - Admitted 02/16/17-02/22/17 for GI bleed. HGB 7.2 and 4.1. Had been on warfarin and given vitamin K and DDAVP. She was intubated and had an EGD showing gastric ulcer with visible vessel bleeding s/p clipping/injection/cautery. She was transfused with 5 units PRBC. Warfarin discontined. H. Pylori serologies and biopsy were equivocal, so she was started on treatment. Extubated 02/19/17. Discharged to SNF.  - PCP is Dr. Sherwood Gambler at [facility] in [place].  - Cardiologist is Dr. Purvis Sheffield at [facility]. Last visit 08/28/17. She is no longer an anticoagulation since GI bleed. She had mild AS by 10/2016 echo, but he plans to assess clinically and surveillance echocardiography only as needed. Six month follow-up planned.   Meds include Cardizem CD, Norco, Lopressor, Rena-Vit, Renvela.  EKG 06/24/17: Afib with RVR at 106 bpm.  LAD. ST/T wave abnormality, consider lateral ischemia. Decreased rate compared to 05/04/17. Known lateral T wave inversion, particularly in high lateral leads.  Echo 11/09/16: Study Conclusions - Left ventricle: The cavity size was normal. Systolic function was normal. The estimated ejection fraction was 55%. Wall motion was normal; there were no regional wall motion abnormalities. - Aortic valve: Trileaflet; mildly thickened, mildly calcified leaflets. Morphologically, there appears to be mild aortic stenosis. - Mitral valve: Severely calcified annulus. Mildly thickened leaflets . - Left atrium: The atrium was severely dilated. - Right atrium: The atrium was mildly dilated. - Pericardium, extracardiac: Small to moderate size pericardial effusion. No evidence of tamponade physiology.  BLE venous U/S 11/06/16: IMPRESSION: 1. No evidence of right lower extremity deep vein thrombosis. 2. Probable chronic nonocclusive DVT in the left popliteal and femoral veins. No evidence of superimposed acute DVT.  Patient with ESRD on HD. She needs new permanent HD access. Known chronic afib, off anticoagulants due to major GI bleed last year. Last seen by cardiology 07/2017. HR on 11/18/17 documented in Epic as 57-78 bpm. She will get vitals and an ISTAT4 on the day of surgery. I afib rate controlled, labs acceptable, and otherwise no acute changes then I would anticipate that she could proceed as planned.  Velna Ochs [facility] Short Stay Center/Anesthesiology Phone [phone] 11/19/2017 2:51 PM

## 2017-11-21 DIAGNOSIS — Z992 Dependence on renal dialysis: Secondary | ICD-10-CM | POA: Diagnosis not present

## 2017-11-21 DIAGNOSIS — N186 End stage renal disease: Secondary | ICD-10-CM | POA: Diagnosis not present

## 2017-11-21 DIAGNOSIS — L03032 Cellulitis of left toe: Secondary | ICD-10-CM | POA: Diagnosis not present

## 2017-11-21 DIAGNOSIS — L299 Pruritus, unspecified: Secondary | ICD-10-CM | POA: Diagnosis not present

## 2017-11-21 DIAGNOSIS — R600 Localized edema: Secondary | ICD-10-CM | POA: Diagnosis not present

## 2017-11-22 ENCOUNTER — Encounter (HOSPITAL_COMMUNITY): Payer: Self-pay

## 2017-11-22 ENCOUNTER — Ambulatory Visit (HOSPITAL_COMMUNITY): Admit: 2017-11-22 | Payer: Medicare Other | Admitting: Cardiovascular Disease

## 2017-11-22 ENCOUNTER — Telehealth: Payer: Self-pay | Admitting: *Deleted

## 2017-11-22 MED FILL — Lidocaine HCl Local Inj 1%: INTRAMUSCULAR | Qty: 20 | Status: AC

## 2017-11-22 NOTE — Telephone Encounter (Signed)
Call and spoke to to patient's husband and instructed to be at [facility] admitting at [facility] on 12/02/17 for surgery. No food or drink night prior and to follow the detailed instructions from the [facility] pre- admission testing department about this surgery. Verbalized understanding.

## 2017-11-23 DIAGNOSIS — Z992 Dependence on renal dialysis: Secondary | ICD-10-CM | POA: Diagnosis not present

## 2017-11-23 DIAGNOSIS — N186 End stage renal disease: Secondary | ICD-10-CM | POA: Diagnosis not present

## 2017-11-24 ENCOUNTER — Emergency Department (HOSPITAL_COMMUNITY): Payer: Medicare Other

## 2017-11-24 ENCOUNTER — Other Ambulatory Visit: Payer: Self-pay

## 2017-11-24 ENCOUNTER — Encounter (HOSPITAL_COMMUNITY): Payer: Self-pay | Admitting: Emergency Medicine

## 2017-11-24 ENCOUNTER — Inpatient Hospital Stay (HOSPITAL_COMMUNITY)
Admission: EM | Admit: 2017-11-24 | Discharge: 2017-12-03 | DRG: 299 | Disposition: A | Discharge status: Home / Self Care | Payer: Medicare Other | Attending: Internal Medicine | Admitting: Internal Medicine

## 2017-11-24 DIAGNOSIS — I82493 Acute embolism and thrombosis of other specified deep vein of lower extremity, bilateral: Secondary | ICD-10-CM

## 2017-11-24 DIAGNOSIS — M25572 Pain in left ankle and joints of left foot: Secondary | ICD-10-CM | POA: Diagnosis not present

## 2017-11-24 DIAGNOSIS — Z86718 Personal history of other venous thrombosis and embolism: Secondary | ICD-10-CM

## 2017-11-24 DIAGNOSIS — E785 Hyperlipidemia, unspecified: Secondary | ICD-10-CM | POA: Diagnosis present

## 2017-11-24 DIAGNOSIS — Z8711 Personal history of peptic ulcer disease: Secondary | ICD-10-CM

## 2017-11-24 DIAGNOSIS — Z9049 Acquired absence of other specified parts of digestive tract: Secondary | ICD-10-CM | POA: Diagnosis not present

## 2017-11-24 DIAGNOSIS — I82413 Acute embolism and thrombosis of femoral vein, bilateral: Secondary | ICD-10-CM | POA: Diagnosis not present

## 2017-11-24 DIAGNOSIS — I82431 Acute embolism and thrombosis of right popliteal vein: Secondary | ICD-10-CM | POA: Diagnosis not present

## 2017-11-24 DIAGNOSIS — M79605 Pain in left leg: Secondary | ICD-10-CM

## 2017-11-24 DIAGNOSIS — I482 Chronic atrial fibrillation: Secondary | ICD-10-CM | POA: Diagnosis present

## 2017-11-24 DIAGNOSIS — I350 Nonrheumatic aortic (valve) stenosis: Secondary | ICD-10-CM | POA: Diagnosis not present

## 2017-11-24 DIAGNOSIS — R200 Anesthesia of skin: Secondary | ICD-10-CM | POA: Diagnosis present

## 2017-11-24 DIAGNOSIS — R52 Pain, unspecified: Secondary | ICD-10-CM | POA: Diagnosis not present

## 2017-11-24 DIAGNOSIS — I82491 Acute embolism and thrombosis of other specified deep vein of right lower extremity: Secondary | ICD-10-CM | POA: Diagnosis present

## 2017-11-24 DIAGNOSIS — Z9841 Cataract extraction status, right eye: Secondary | ICD-10-CM | POA: Diagnosis not present

## 2017-11-24 DIAGNOSIS — Z515 Encounter for palliative care: Secondary | ICD-10-CM

## 2017-11-24 DIAGNOSIS — Z992 Dependence on renal dialysis: Secondary | ICD-10-CM | POA: Diagnosis not present

## 2017-11-24 DIAGNOSIS — I4891 Unspecified atrial fibrillation: Secondary | ICD-10-CM | POA: Diagnosis present

## 2017-11-24 DIAGNOSIS — I480 Paroxysmal atrial fibrillation: Secondary | ICD-10-CM | POA: Diagnosis not present

## 2017-11-24 DIAGNOSIS — M1991 Primary osteoarthritis, unspecified site: Secondary | ICD-10-CM | POA: Diagnosis not present

## 2017-11-24 DIAGNOSIS — I82403 Acute embolism and thrombosis of unspecified deep veins of lower extremity, bilateral: Secondary | ICD-10-CM | POA: Diagnosis present

## 2017-11-24 DIAGNOSIS — Z79899 Other long term (current) drug therapy: Secondary | ICD-10-CM

## 2017-11-24 DIAGNOSIS — N2581 Secondary hyperparathyroidism of renal origin: Secondary | ICD-10-CM | POA: Diagnosis present

## 2017-11-24 DIAGNOSIS — I959 Hypotension, unspecified: Secondary | ICD-10-CM | POA: Diagnosis not present

## 2017-11-24 DIAGNOSIS — Z961 Presence of intraocular lens: Secondary | ICD-10-CM | POA: Diagnosis not present

## 2017-11-24 DIAGNOSIS — M549 Dorsalgia, unspecified: Secondary | ICD-10-CM | POA: Diagnosis not present

## 2017-11-24 DIAGNOSIS — D631 Anemia in chronic kidney disease: Secondary | ICD-10-CM | POA: Diagnosis present

## 2017-11-24 DIAGNOSIS — I10 Essential (primary) hypertension: Secondary | ICD-10-CM | POA: Diagnosis not present

## 2017-11-24 DIAGNOSIS — M79604 Pain in right leg: Secondary | ICD-10-CM

## 2017-11-24 DIAGNOSIS — I739 Peripheral vascular disease, unspecified: Secondary | ICD-10-CM | POA: Diagnosis not present

## 2017-11-24 DIAGNOSIS — R000 Tachycardia, unspecified: Secondary | ICD-10-CM | POA: Diagnosis not present

## 2017-11-24 DIAGNOSIS — M79671 Pain in right foot: Secondary | ICD-10-CM | POA: Diagnosis not present

## 2017-11-24 DIAGNOSIS — R2681 Unsteadiness on feet: Secondary | ICD-10-CM | POA: Diagnosis not present

## 2017-11-24 DIAGNOSIS — Z792 Long term (current) use of antibiotics: Secondary | ICD-10-CM

## 2017-11-24 DIAGNOSIS — E7800 Pure hypercholesterolemia, unspecified: Secondary | ICD-10-CM | POA: Diagnosis present

## 2017-11-24 DIAGNOSIS — Z79891 Long term (current) use of opiate analgesic: Secondary | ICD-10-CM

## 2017-11-24 DIAGNOSIS — K2970 Gastritis, unspecified, without bleeding: Secondary | ICD-10-CM | POA: Diagnosis present

## 2017-11-24 DIAGNOSIS — F419 Anxiety disorder, unspecified: Secondary | ICD-10-CM | POA: Diagnosis present

## 2017-11-24 DIAGNOSIS — K219 Gastro-esophageal reflux disease without esophagitis: Secondary | ICD-10-CM | POA: Diagnosis not present

## 2017-11-24 DIAGNOSIS — Z888 Allergy status to other drugs, medicaments and biological substances status: Secondary | ICD-10-CM

## 2017-11-24 DIAGNOSIS — Z9842 Cataract extraction status, left eye: Secondary | ICD-10-CM

## 2017-11-24 DIAGNOSIS — K2950 Unspecified chronic gastritis without bleeding: Secondary | ICD-10-CM | POA: Diagnosis not present

## 2017-11-24 DIAGNOSIS — M25571 Pain in right ankle and joints of right foot: Secondary | ICD-10-CM | POA: Diagnosis not present

## 2017-11-24 DIAGNOSIS — N186 End stage renal disease: Secondary | ICD-10-CM

## 2017-11-24 DIAGNOSIS — M25519 Pain in unspecified shoulder: Secondary | ICD-10-CM | POA: Diagnosis not present

## 2017-11-24 DIAGNOSIS — I5032 Chronic diastolic (congestive) heart failure: Secondary | ICD-10-CM | POA: Diagnosis not present

## 2017-11-24 DIAGNOSIS — Z823 Family history of stroke: Secondary | ICD-10-CM

## 2017-11-24 DIAGNOSIS — Z90710 Acquired absence of both cervix and uterus: Secondary | ICD-10-CM

## 2017-11-24 DIAGNOSIS — I120 Hypertensive chronic kidney disease with stage 5 chronic kidney disease or end stage renal disease: Secondary | ICD-10-CM | POA: Diagnosis not present

## 2017-11-24 DIAGNOSIS — I351 Nonrheumatic aortic (valve) insufficiency: Secondary | ICD-10-CM | POA: Diagnosis not present

## 2017-11-24 DIAGNOSIS — Z7189 Other specified counseling: Secondary | ICD-10-CM | POA: Diagnosis not present

## 2017-11-24 DIAGNOSIS — R296 Repeated falls: Secondary | ICD-10-CM | POA: Diagnosis present

## 2017-11-24 DIAGNOSIS — M79672 Pain in left foot: Secondary | ICD-10-CM | POA: Diagnosis not present

## 2017-11-24 DIAGNOSIS — K254 Chronic or unspecified gastric ulcer with hemorrhage: Secondary | ICD-10-CM | POA: Diagnosis present

## 2017-11-24 DIAGNOSIS — Z8249 Family history of ischemic heart disease and other diseases of the circulatory system: Secondary | ICD-10-CM

## 2017-11-24 DIAGNOSIS — M7989 Other specified soft tissue disorders: Secondary | ICD-10-CM | POA: Diagnosis not present

## 2017-11-24 DIAGNOSIS — Z86711 Personal history of pulmonary embolism: Secondary | ICD-10-CM

## 2017-11-24 HISTORY — DX: Gastrointestinal hemorrhage, unspecified: K92.2

## 2017-11-24 LAB — COMPREHENSIVE METABOLIC PANEL WITH GFR
ALT: 13 U/L — ABNORMAL LOW (ref 14–54)
AST: 16 U/L (ref 15–41)
Albumin: 3 g/dL — ABNORMAL LOW (ref 3.5–5.0)
Alkaline Phosphatase: 84 U/L (ref 38–126)
Anion gap: 15 (ref 5–15)
BUN: 20 mg/dL (ref 6–20)
CO2: 25 mmol/L (ref 22–32)
Calcium: 10.2 mg/dL (ref 8.9–10.3)
Chloride: 99 mmol/L — ABNORMAL LOW (ref 101–111)
Creatinine, Ser: 4.63 mg/dL — ABNORMAL HIGH (ref 0.44–1.00)
GFR calc Af Amer: 9 mL/min — ABNORMAL LOW
GFR calc non Af Amer: 8 mL/min — ABNORMAL LOW
Glucose, Bld: 120 mg/dL — ABNORMAL HIGH (ref 65–99)
Potassium: 3.6 mmol/L (ref 3.5–5.1)
Sodium: 139 mmol/L (ref 135–145)
Total Bilirubin: 0.7 mg/dL (ref 0.3–1.2)
Total Protein: 6.1 g/dL — ABNORMAL LOW (ref 6.5–8.1)

## 2017-11-24 LAB — CBC WITH DIFFERENTIAL/PLATELET
Basophils Absolute: 0 K/uL (ref 0.0–0.1)
Basophils Relative: 0 %
Eosinophils Absolute: 0.1 K/uL (ref 0.0–0.7)
Eosinophils Relative: 1 %
HCT: 40.3 % (ref 36.0–46.0)
Hemoglobin: 12.1 g/dL (ref 12.0–15.0)
Lymphocytes Relative: 9 %
Lymphs Abs: 0.9 K/uL (ref 0.7–4.0)
MCH: 29.3 pg (ref 26.0–34.0)
MCHC: 30 g/dL (ref 30.0–36.0)
MCV: 97.6 fL (ref 78.0–100.0)
Monocytes Absolute: 0.8 K/uL (ref 0.1–1.0)
Monocytes Relative: 8 %
Neutro Abs: 8.3 K/uL — ABNORMAL HIGH (ref 1.7–7.7)
Neutrophils Relative %: 82 %
Platelets: 238 K/uL (ref 150–400)
RBC: 4.13 MIL/uL (ref 3.87–5.11)
RDW: 14.5 % (ref 11.5–15.5)
WBC: 10.1 K/uL (ref 4.0–10.5)

## 2017-11-24 LAB — APTT: aPTT: 34 seconds (ref 24–36)

## 2017-11-24 LAB — LACTIC ACID, PLASMA
LACTIC ACID, VENOUS: 1.8 mmol/L (ref 0.5–1.9)
Lactic Acid, Venous: 1.7 mmol/L (ref 0.5–1.9)

## 2017-11-24 LAB — PROTIME-INR
INR: 0.97
PROTHROMBIN TIME: 12.8 s (ref 11.4–15.2)

## 2017-11-24 LAB — HEPARIN LEVEL (UNFRACTIONATED): Heparin Unfractionated: 0.91 IU/mL — ABNORMAL HIGH (ref 0.30–0.70)

## 2017-11-24 MED ORDER — HYDROXYZINE HCL 25 MG PO TABS
25.0000 mg | ORAL_TABLET | Freq: Four times a day (QID) | ORAL | Status: DC | PRN
  Administered 2017-11-30 – 2017-12-01 (×2): 25 mg via ORAL
  Filled 2017-11-24 (×2): qty 1

## 2017-11-24 MED ORDER — SODIUM CHLORIDE 0.9% FLUSH
3.0000 mL | Freq: Two times a day (BID) | INTRAVENOUS | Status: DC
  Administered 2017-11-26 – 2017-11-30 (×3): 3 mL via INTRAVENOUS

## 2017-11-24 MED ORDER — HEPARIN BOLUS VIA INFUSION
4000.0000 [IU] | Freq: Once | INTRAVENOUS | Status: AC
  Administered 2017-11-24: 4000 [IU] via INTRAVENOUS

## 2017-11-24 MED ORDER — SENNA 8.6 MG PO TABS
1.0000 | ORAL_TABLET | Freq: Two times a day (BID) | ORAL | Status: DC
  Administered 2017-11-25 – 2017-12-02 (×8): 8.6 mg via ORAL
  Filled 2017-11-24 (×16): qty 1

## 2017-11-24 MED ORDER — POLYETHYLENE GLYCOL 3350 17 G PO PACK: 17.0000 g | PACK | Freq: Every day | ORAL | Status: DC | PRN

## 2017-11-24 MED ORDER — ACETAMINOPHEN 650 MG RE SUPP: 650.0000 mg | Freq: Four times a day (QID) | RECTAL | Status: DC | PRN

## 2017-11-24 MED ORDER — ALBUTEROL SULFATE (2.5 MG/3ML) 0.083% IN NEBU: 2.5000 mg | INHALATION_SOLUTION | RESPIRATORY_TRACT | Status: DC | PRN

## 2017-11-24 MED ORDER — TRAZODONE HCL 50 MG PO TABS
50.0000 mg | ORAL_TABLET | Freq: Every evening | ORAL | Status: DC | PRN
  Administered 2017-11-25 – 2017-12-02 (×2): 50 mg via ORAL
  Filled 2017-11-24 (×2): qty 1

## 2017-11-24 MED ORDER — HEPARIN (PORCINE) IN NACL 100-0.45 UNIT/ML-% IJ SOLN
800.0000 [IU]/h | INTRAMUSCULAR | Status: DC
  Administered 2017-11-25 – 2017-11-26 (×3): 950 [IU]/h via INTRAVENOUS
  Administered 2017-11-27 – 2017-11-29 (×2): 850 [IU]/h via INTRAVENOUS
  Administered 2017-11-30 – 2017-12-02 (×3): 900 [IU]/h via INTRAVENOUS
  Filled 2017-11-24 (×8): qty 250

## 2017-11-24 MED ORDER — ALPRAZOLAM 0.5 MG PO TABS
0.5000 mg | ORAL_TABLET | Freq: Three times a day (TID) | ORAL | Status: DC | PRN
Start: 2017-11-24 — End: 2017-12-03
  Administered 2017-11-24 – 2017-11-28 (×5): 0.5 mg via ORAL
  Filled 2017-11-24 (×5): qty 1

## 2017-11-24 MED ORDER — METOPROLOL TARTRATE 25 MG PO TABS
25.0000 mg | ORAL_TABLET | Freq: Two times a day (BID) | ORAL | Status: DC
  Administered 2017-11-24 – 2017-11-27 (×6): 25 mg via ORAL
  Filled 2017-11-24 (×7): qty 1

## 2017-11-24 MED ORDER — PANTOPRAZOLE SODIUM 40 MG PO TBEC
40.0000 mg | DELAYED_RELEASE_TABLET | Freq: Every day | ORAL | Status: DC
  Administered 2017-11-25 – 2017-12-03 (×9): 40 mg via ORAL
  Filled 2017-11-24 (×9): qty 1

## 2017-11-24 MED ORDER — DILTIAZEM HCL 30 MG PO TABS
30.0000 mg | ORAL_TABLET | Freq: Three times a day (TID) | ORAL | Status: DC
  Administered 2017-11-24 – 2017-11-25 (×3): 30 mg via ORAL
  Filled 2017-11-24 (×4): qty 1

## 2017-11-24 MED ORDER — SEVELAMER CARBONATE 800 MG PO TABS
800.0000 mg | ORAL_TABLET | Freq: Three times a day (TID) | ORAL | Status: DC
  Administered 2017-11-25 – 2017-11-29 (×10): 800 mg via ORAL
  Filled 2017-11-24 (×14): qty 1

## 2017-11-24 MED ORDER — ONDANSETRON HCL 4 MG PO TABS
4.0000 mg | ORAL_TABLET | Freq: Four times a day (QID) | ORAL | Status: DC | PRN
  Administered 2017-11-30 – 2017-12-01 (×2): 4 mg via ORAL
  Filled 2017-11-24 (×3): qty 1

## 2017-11-24 MED ORDER — SODIUM CHLORIDE 0.9 % IV SOLN: 250.0000 mL | INTRAVENOUS | Status: DC | PRN

## 2017-11-24 MED ORDER — ONDANSETRON HCL 4 MG/2ML IJ SOLN: 4.0000 mg | Freq: Four times a day (QID) | INTRAMUSCULAR | Status: DC | PRN

## 2017-11-24 MED ORDER — MORPHINE SULFATE (PF) 2 MG/ML IV SOLN: 2.0000 mg | INTRAVENOUS | Status: AC | PRN

## 2017-11-24 MED ORDER — HYDROCODONE-ACETAMINOPHEN 10-325 MG PO TABS
1.0000 | ORAL_TABLET | Freq: Four times a day (QID) | ORAL | Status: DC | PRN
  Administered 2017-11-24 – 2017-12-03 (×17): 1 via ORAL
  Filled 2017-11-24 (×17): qty 1

## 2017-11-24 MED ORDER — RENA-VITE PO TABS
1.0000 | ORAL_TABLET | Freq: Every day | ORAL | Status: DC
  Administered 2017-11-25 – 2017-12-03 (×9): 1 via ORAL
  Filled 2017-11-24 (×9): qty 1

## 2017-11-24 MED ORDER — SODIUM CHLORIDE 0.9% FLUSH: 3.0000 mL | INTRAVENOUS | Status: DC | PRN

## 2017-11-24 MED ORDER — PROMETHAZINE HCL 12.5 MG PO TABS: 25.0000 mg | ORAL_TABLET | Freq: Four times a day (QID) | ORAL | Status: DC | PRN

## 2017-11-24 MED ORDER — ACETAMINOPHEN 325 MG PO TABS
650.0000 mg | ORAL_TABLET | Freq: Four times a day (QID) | ORAL | Status: DC | PRN
  Filled 2017-11-24: qty 2

## 2017-11-24 NOTE — H&P (Signed)
Patient Demographics:    Patricia Ayala, is a 84 y.o. female  MRN: [id number]   DOB - 02-13-1929  Admit Date - 11/24/2017  Outpatient Primary MD for the patient is [name], MD   Assessment & Plan:    Principal Problem:   Extensive DVT of lower extremity, bilateral (HCC) Active Problems:   ESRD (end stage renal disease) (HCC)   Benign essential HTN   Atrial fibrillation with RVR (HCC)   H/o Chronic gastric ulcer with hemorrhage 01/2017   H/o Helicobacter pylori ab+  NB!!! Please note that in April 2018 patient was taken off Coumadin due to life-threatening GI bleed from gastric ulcer requiring transfusion of more than 5 units of packed cells as well as 4 units of FFP, patient was intubated for couple of days at the time, she was hospitalized from 02/16/2017 to 02/22/2017 subsequently treated for possible H. pylori infection, please also note that this episode of GI bleed was precipitated apparently by anticoagulation with Coumadin for A. fib and history of DVT while also taking excessive amount of naproxen Patient and daughter today in the ED verbalized understanding of risk of bleeding with anticoagulation however patient is no longer taking NSAIDs, and patient has had no further bleeding since that episode in April 2018 she is also been off Coumadin since then   Plan:- 1)Acute extensive Bil DVT-   extensive DVT of both lower extremities extending from the common femoral veins, involving the femoral veins, popliteal and calf veins, risk benefit of anticoagulation discussed with patient and daughter Judeth Cornfield at bedside, given very heavy clot burden/bilateral DVTs, patient is high risk for proximal clot propagation and PE, patient and daughter are agreeable to anticoagulation at this time, they understand the risk  of bleeding given the events that she went through in April 2018 as outlined above and below.  Start IV heparin per protocol, watch for bleeding, monitor platelets and H&H closely.  Given that patient has end-stage renal disease options of oral anticoagulants are kind of limited.  Patient aware that given her recurrent DVT which she probably will need lifelong anticoagulation.  If she bleeds this time around she would need IVC filter , ED provider discussed this case with on-call vascular surgeon Dr. Darrick Penna who advised no intervention at this time, Dr. Darrick Penna advised anticoagulation for now  2)ESRD- HD on T/T/S, last HD 11/23/2017, consult Dr. Kristian Covey for hemodialysis while here  3)Afib-continue Cardizem and metoprolol for rate control, patient is already been started on IV heparin as outlined above  4)H/o PUD-prior history of gastric ulcer with life-threatening bleeding requiring intervention in April 2018, prior treatment for equivocal studies of H pylori.  Patient aware that she needs to absolutely avoid NSAIDs, give Protonix 40 mg daily empirically  5)Social/Ethics-plan of care and CODE STATUS discussed with patient and daughter Judeth Cornfield at bedside, patient is a FULL CODE  With History of - Reviewed by me  Past Medical History:  Diagnosis  Date  . Anemia 02/11/2012  . Anxiety   . Aortic stenosis    mild AS 10/2016 echo  . Arthritis    arthritis in neck  . Atrial fibrillation (HCC)   . CHF (congestive heart failure) (HCC)   . Chronic kidney disease    hemodialysis Tu/Th/Sa  . Dyspnea    on exertion  . GERD (gastroesophageal reflux disease)   . GI bleed 01/2017   due to gastric ulcer, taking NSAIDs while on coumadin  . H/O metabolic acidosis   . Headache   . Hyperlipidemia   . Hyperparathyroidism, secondary (HCC)   . Hypertension    Dr. Regino Schultze, [place], [place]  . Peptic ulcer   . Peripheral vascular disease (HCC)   . Vitamin D deficiency       Past Surgical History:    Procedure Laterality Date  . A/V FISTULAGRAM N/A 05/10/2017   Procedure: A/V Fistulagram;  Surgeon: Sherren Kerns, MD;  Location: [facility] INVASIVE CV LAB;  Service: Cardiovascular;  Laterality: N/A;  . A/V FISTULAGRAM Left 11/18/2017   Procedure: A/V FISTULAGRAM;  Surgeon: Fransisco Hertz, MD;  Location: [facility] INVASIVE CV LAB;  Service: Cardiovascular;  Laterality: Left;  . ABDOMINAL HYSTERECTOMY    . AV FISTULA PLACEMENT  04/01/2012   Procedure: ARTERIOVENOUS (AV) FISTULA CREATION;  Surgeon: Sherren Kerns, MD;  Location: [facility] OR;  Service: Vascular;  Laterality: Left;  . AV FISTULA PLACEMENT Left 06/24/2017   Procedure: ARTERIOVENOUS (AV) FISTULA CREATION;  Surgeon: Maeola Harman, MD;  Location: [facility] OR;  Service: Vascular;  Laterality: Left;  . BASCILIC VEIN TRANSPOSITION Left 08/26/2017   Procedure: BASILIC VEIN TRANSPOSITION SECOND STAGE;  Surgeon: Maeola Harman, MD;  Location: [facility] OR;  Service: Vascular;  Laterality: Left;  . CHOLECYSTECTOMY    . COLON SURGERY     for a blockage  . ESOPHAGOGASTRODUODENOSCOPY N/A 02/17/2017   Procedure: ESOPHAGOGASTRODUODENOSCOPY (EGD);  Surgeon: Sherrilyn Rist, MD;  Location: [facility] ENDOSCOPY;  Service: Endoscopy;  Laterality: N/A;  Bedside case in ICU  . EYE SURGERY     bilateral cataracts, /w IOL - 2013  . FISTULOGRAM N/A 11/05/2014   Procedure: FISTULOGRAM;  Surgeon: Larina Earthly, MD;  Location: [facility] CATH LAB;  Service: Cardiovascular;  Laterality: N/A;  . INSERTION OF DIALYSIS CATHETER  04/10/2012   Procedure: INSERTION OF DIALYSIS CATHETER;  Surgeon: Pryor Ochoa, MD;  Location: [facility] OR;  Service: Vascular;  Laterality: N/A;  Insertion Diatek Catheter Right Internal Jugular  . LIGATION OF COMPETING BRANCHES OF ARTERIOVENOUS FISTULA  11/07/2012   Procedure: LIGATION OF COMPETING BRANCHES OF ARTERIOVENOUS FISTULA;  Surgeon: Chuck Hint, MD;  Location: [facility] OR;  Service: Vascular;  Laterality: Left;  Brachio/Cephalic Fistula  . SHUNTOGRAM  N/A 11/03/2012   Procedure: Betsey Amen;  Surgeon: Chuck Hint, MD;  Location: [facility] CATH LAB;  Service: Cardiovascular;  Laterality: N/A;  . SHUNTOGRAM Left 03/09/2013   Procedure: Fistulogram;  Surgeon: Fransisco Hertz, MD;  Location: [facility] CATH LAB;  Service: Cardiovascular;  Laterality: Left;  . SHUNTOGRAM Left 11/23/2013   Procedure: FISTULOGRAM;  Surgeon: Fransisco Hertz, MD;  Location: [facility] CATH LAB;  Service: Cardiovascular;  Laterality: Left;    Chief Complaint  Patient presents with  . Leg Pain      HPI:    Kolby Myung  is a 84 y.o. female past medical history relevant for ESRD with hemodialysis on Tuesdays Thursdays and Saturdays, Afib, HFpEF, chronic anemia of CKD and prior history of DVT  with prior life-threatening GI bleed in 01/2017 while on anticoagulation and taking NSAIDs who now presents with bilateral lower extremity swelling and pain for over 2 months, Leg pains/swelling have worsened over the last several days.  Patient denies chest pains or pleuritic symptoms, no hypoxia.  No hemoptysis, no productive cough, no fever, no chills.  She does endorse dyspnea on exertion.  Patient is somewhat hard of hearing, additional history obtained from patient's daughter Judeth Cornfield at bedside  Last hemodialysis session was 11/23/2017  In ED..... Patient is found to have extensive DVT of both lower extremities extending from the common femoral veins, involving the femoral veins, popliteal and calf veins.  Hemoglobin is 12.1 but it is down from 15.3 on 11/18/2017  Please note that in April 2018 patient was taken off Coumadin due to life-threatening GI bleed from gastric ulcer requiring transfusion of more than 5 units of packed cells as well as 4 units of FFP, patient was intubated for couple of days at the time, she was hospitalized from 02/16/2017 to 02/22/2017 subsequently treated for possible H. pylori infection, please also note that this episode of GI bleed was precipitated apparently by  anticoagulation with Coumadin for A. fib and history of DVT while also taking excessive amount of naproxen  Patient and daughter today in the ED verbalized understanding of risk of bleeding with anticoagulation however patient is no longer taking NSAIDs, and patient has had no further bleeding since that episode in April 2018 she is also been off Coumadin since then      Review of systems:    In addition to the HPI above,   A full 12 point Review of 10 Systems was done, except as stated above, all other Review of 10 Systems were negative.    Social History:  Reviewed by me   Social History   Tobacco Use  . Smoking status: Never Smoker  . Smokeless tobacco: Never Used  Substance Use Topics  . Alcohol use: No     Family History :  Reviewed by me   Family History  Problem Relation Age of Onset  . Stroke Mother   . Cancer Father   . Heart attack Father      Home Medications:   Prior to Admission medications   Medication Sig Start Date End Date Taking? Authorizing Provider  ALPRAZolam Prudy Feeler) 0.5 MG tablet Take 0.5 mg by mouth 3 (three) times daily as needed for anxiety.   Yes [provider]  diltiazem (CARDIZEM) 60 MG tablet Take 60 mg by mouth daily.   Yes [provider]  doxycycline (VIBRA-TABS) 100 MG tablet Take 1 tablet by mouth 2 (two) times daily. 11/21/17  Yes [provider]  HYDROcodone-acetaminophen (NORCO) 10-325 MG tablet Take 1 tablet by mouth 4 (four) times daily as needed for moderate pain.    Yes [provider]  hydrOXYzine (ATARAX/VISTARIL) 25 MG tablet Take 1 tablet by mouth 4 (four) times daily as needed for itching. 11/21/17  Yes [provider]  metoprolol tartrate (LOPRESSOR) 25 MG tablet Take 25 mg by mouth 2 (two) times daily.   Yes [provider]  multivitamin (RENA-VIT) TABS tablet Take 1 tablet by mouth daily.   Yes [provider]  promethazine (PHENERGAN) 25 MG tablet Take 25 mg by  mouth every 6 (six) hours as needed for nausea or vomiting.   Yes [provider]  sevelamer (RENAGEL) 800 MG tablet Take 1,600 mg by mouth 3 (three) times daily with meals.  Yes [provider]     Allergies:     Allergies  Allergen Reactions  . Coumadin [Warfarin Sodium]     Caused GIB     Physical Exam:   Vitals  Blood pressure 137/75, pulse 70, temperature 98.1 F (36.7 C), temperature source Oral, resp. rate 18, height 5\' 4"  (1.626 m), weight 70.3 kg (154 lb 15.7 oz), SpO2 100 %.  Physical Examination: General appearance - alert,  and in no distress, speaking in complete sentences Mental status - alert, oriented to person, place, and time, Ears- HOH Eyes - sclera anicteric  Neck - supple, no JVD elevation , right IJ hemodialysis catheter site is clean dry and intact Chest -diminished breath sounds especially in bases, no wheezing no rhonchi Heart - S1 and S2 normal, irregular,  Abdomen - soft, nontender, nondistended, no masses or organomegaly Neurological - screening mental status exam normal, neck supple without rigidity, cranial nerves II through XII intact, DTR's normal and symmetric, patient with generalized weakness without new focal deficits Extremities -significant bilateral pitting edema, tenderness of both lower extremities, left upper extremity with nonfunctional AV fistula without bruit or thrill Skin - warm, dry, without ecchymosis or petechia Psych-affect is appropriate   Data Review:    CBC Recent Labs  Lab 11/18/17 1123 11/24/17 1053  WBC  --  10.1  HGB 15.3* 12.1  HCT 45.0 40.3  PLT  --  238  MCV  --  97.6  MCH  --  29.3  MCHC  --  30.0  RDW  --  14.5  LYMPHSABS  --  0.9  MONOABS  --  0.8  EOSABS  --  0.1  BASOSABS  --  0.0   ------------------------------------------------------------------------------------------------------------------  Chemistries  Recent Labs  Lab 11/18/17 1123 11/24/17 1053  NA 140 139  K 3.9  3.6  CL 100* 99*  CO2  --  25  GLUCOSE 88 120*  BUN 28* 20  CREATININE 6.00* 4.63*  CALCIUM  --  10.2  AST  --  16  ALT  --  13*  ALKPHOS  --  84  BILITOT  --  0.7   ------------------------------------------------------------------------------------------------------------------ estimated creatinine clearance is 8.2 mL/min (A) (by C-G formula based on SCr of 4.63 mg/dL (H)). ------------------------------------------------------------------------------------------------------------------ No results for input(s): TSH, T4TOTAL, T3FREE, THYROIDAB in the last 72 hours.  Invalid input(s): FREET3   Coagulation profile Recent Labs  Lab 11/24/17 1053  INR 0.97   ------------------------------------------------------------------------------------------------------------------- No results for input(s): DDIMER in the last 72 hours. -------------------------------------------------------------------------------------------------------------------  Cardiac Enzymes No results for input(s): CKMB, TROPONINI, MYOGLOBIN in the last 168 hours.  Invalid input(s): CK ------------------------------------------------------------------------------------------------------------------    Component Value Date/Time   BNP 1,962.0 (H) 11/01/2016 0655    -------------------------------------------------------------------------------------------------------------  Urinalysis    Component Value Date/Time   COLORURINE AMBER (A) 05/04/2017 1053   APPEARANCEUR CLOUDY (A) 05/04/2017 1053   LABSPEC 1.015 05/04/2017 1053   PHURINE 6.0 05/04/2017 1053   GLUCOSEU NEGATIVE 05/04/2017 1053   HGBUR MODERATE (A) 05/04/2017 1053   BILIRUBINUR NEGATIVE 05/04/2017 1053   KETONESUR NEGATIVE 05/04/2017 1053   PROTEINUR >=300 (A) 05/04/2017 1053   NITRITE NEGATIVE 05/04/2017 1053   LEUKOCYTESUR LARGE (A) 05/04/2017 1053     ----------------------------------------------------------------------------------------------------------------   Imaging Results:    Dg Ankle Complete Left  Result Date: 11/24/2017 CLINICAL DATA:  Pain and swelling for 3 months EXAM: LEFT ANKLE COMPLETE - 3+ VIEW COMPARISON:  None FINDINGS: Diffuse soft tissue swelling. Osseous demineralization. Joint spaces preserved. No acute fracture,  dislocation, or bone destruction. Scattered soft tissue calcifications at lower leg including phleboliths and vascular calcifications extending into foot. IMPRESSION: Significant soft tissue swelling and osseous demineralization without acute bony abnormalities. Electronically Signed   By: Ulyses Southward M.D.   On: 11/24/2017 13:19   Dg Ankle Complete Right  Result Date: 11/24/2017 CLINICAL DATA:  Pain and swelling for 3 months EXAM: RIGHT ANKLE - COMPLETE 3+ VIEW COMPARISON:  None FINDINGS: Marked soft tissue swelling. Diffuse osseous demineralization. Joint spaces preserved. No acute fracture, dislocation, or bone destruction. Small vessel vascular calcifications extending into foot. IMPRESSION: Significant soft tissue swelling and osseous demineralization without acute bony abnormality. Electronically Signed   By: Ulyses Southward M.D.   On: 11/24/2017 13:19   US Venous Img Lower Bilateral  Result Date: 11/24/2017 CLINICAL DATA:  Pain and swelling in both lower extremities for 3 months EXAM: BILATERAL LOWER EXTREMITY VENOUS DOPPLER ULTRASOUND TECHNIQUE: Gray-scale sonography with graded compression, as well as color Doppler and duplex ultrasound were performed to evaluate the lower extremity deep venous systems from the level of the common femoral vein and including the common femoral, femoral, profunda femoral, popliteal and calf veins including the posterior tibial, peroneal and gastrocnemius veins when visible. The superficial great saphenous vein was also interrogated. Spectral Doppler was utilized to evaluate  flow at rest and with distal augmentation maneuvers in the common femoral, femoral and popliteal veins. COMPARISON:  11/06/2016 FINDINGS: RIGHT LOWER EXTREMITY Common Femoral Vein: Nonocclusive hypoechoic thrombus with impaired compressibility and diminished flow Saphenofemoral Junction: No evidence of thrombus. Normal compressibility and flow on color Doppler imaging. Profunda Femoral Vein: No evidence of thrombus. Normal compressibility and flow on color Doppler imaging. Femoral Vein: Nonocclusive hypoechoic thrombus with impaired compressibility and diminished spontaneous venous flow Popliteal Vein: Nonocclusive hypoechoic thrombus with impaired compressibility and diminished spontaneous venous flow Calf Veins: Occlusive thrombus of the perineal and posterior tibial veins Superficial Great Saphenous Vein: No evidence of thrombus. Normal compressibility. Venous Reflux:  None. Other Findings: Baker cyst RIGHT popliteal fossa 5.2 x 1.9 x 3.8 cm. Scattered subcutaneous edema. LEFT LOWER EXTREMITY Common Femoral Vein: Occlusive hypoechoic thrombus with impaired compressibility and absent spontaneous venous flow Saphenofemoral Junction: Thrombus at the saphenofemoral junction. Profunda Femoral Vein: No evidence of thrombus. Normal compressibility and flow on color Doppler imaging. Femoral Vein: Non occlusive hypoechoic thrombus proximally with occlusive thrombus distally Popliteal Vein: No evidence of thrombus. Normal compressibility, respiratory phasicity and response to augmentation. Calf Veins: No evidence of thrombus. Normal compressibility and flow on color Doppler imaging. Superficial Great Saphenous Vein: No evidence of thrombus in greater saphenous vein. Hypoechoic thrombus seen within lesser saphenous vein with impaired compressibility. Venous Reflux:  None. Other Findings:  Scattered subcutaneous edema IMPRESSION: Positive exam for presence of acute appearing deep venous thrombosis in the lower extremities  bilaterally involving BILATERAL common femoral veins, BILATERAL femoral veins, and RIGHT popliteal vein into RIGHT calf veins. Baker cyst RIGHT popliteal fossa. Electronically Signed   By: Ulyses Southward M.D.   On: 11/24/2017 13:12   Dg Foot Complete Left  Result Date: 11/24/2017 CLINICAL DATA:  Pain and swelling for 3 months EXAM: LEFT FOOT - COMPLETE 3+ VIEW COMPARISON:  None. FINDINGS: Significant diffuse soft tissue swelling. Osseous demineralization. Joint spaces preserved. Small vessel vascular calcifications. Calcaneal spurring. No acute fracture, dislocation or bone destruction. IMPRESSION: Significant soft tissue swelling and osseous demineralization without acute bony abnormalities. Calcaneal spurring. Electronically Signed   By: Ulyses Southward M.D.   On: 11/24/2017 13:17  Dg Foot Complete Right  Result Date: 11/24/2017 CLINICAL DATA:  Pain and swelling for 3 months EXAM: RIGHT FOOT COMPLETE - 3+ VIEW COMPARISON:  None FINDINGS: Significant diffuse soft tissue edema. Osseous demineralization. Joint spaces preserved. No acute fracture, dislocation, or bone destruction. Small plantar calcaneal spur. Small vessel vascular calcifications. IMPRESSION: Osseous demineralization and significant soft tissue swelling without acute bony abnormalities. Calcaneal spur. Electronically Signed   By: Ulyses Southward M.D.   On: 11/24/2017 13:15    Radiological Exams on Admission: Dg Ankle Complete Left  Result Date: 11/24/2017 CLINICAL DATA:  Pain and swelling for 3 months EXAM: LEFT ANKLE COMPLETE - 3+ VIEW COMPARISON:  None FINDINGS: Diffuse soft tissue swelling. Osseous demineralization. Joint spaces preserved. No acute fracture, dislocation, or bone destruction. Scattered soft tissue calcifications at lower leg including phleboliths and vascular calcifications extending into foot. IMPRESSION: Significant soft tissue swelling and osseous demineralization without acute bony abnormalities. Electronically Signed   By:  Ulyses Southward M.D.   On: 11/24/2017 13:19   Dg Ankle Complete Right  Result Date: 11/24/2017 CLINICAL DATA:  Pain and swelling for 3 months EXAM: RIGHT ANKLE - COMPLETE 3+ VIEW COMPARISON:  None FINDINGS: Marked soft tissue swelling. Diffuse osseous demineralization. Joint spaces preserved. No acute fracture, dislocation, or bone destruction. Small vessel vascular calcifications extending into foot. IMPRESSION: Significant soft tissue swelling and osseous demineralization without acute bony abnormality. Electronically Signed   By: Ulyses Southward M.D.   On: 11/24/2017 13:19   US Venous Img Lower Bilateral  Result Date: 11/24/2017 CLINICAL DATA:  Pain and swelling in both lower extremities for 3 months EXAM: BILATERAL LOWER EXTREMITY VENOUS DOPPLER ULTRASOUND TECHNIQUE: Gray-scale sonography with graded compression, as well as color Doppler and duplex ultrasound were performed to evaluate the lower extremity deep venous systems from the level of the common femoral vein and including the common femoral, femoral, profunda femoral, popliteal and calf veins including the posterior tibial, peroneal and gastrocnemius veins when visible. The superficial great saphenous vein was also interrogated. Spectral Doppler was utilized to evaluate flow at rest and with distal augmentation maneuvers in the common femoral, femoral and popliteal veins. COMPARISON:  11/06/2016 FINDINGS: RIGHT LOWER EXTREMITY Common Femoral Vein: Nonocclusive hypoechoic thrombus with impaired compressibility and diminished flow Saphenofemoral Junction: No evidence of thrombus. Normal compressibility and flow on color Doppler imaging. Profunda Femoral Vein: No evidence of thrombus. Normal compressibility and flow on color Doppler imaging. Femoral Vein: Nonocclusive hypoechoic thrombus with impaired compressibility and diminished spontaneous venous flow Popliteal Vein: Nonocclusive hypoechoic thrombus with impaired compressibility and diminished  spontaneous venous flow Calf Veins: Occlusive thrombus of the perineal and posterior tibial veins Superficial Great Saphenous Vein: No evidence of thrombus. Normal compressibility. Venous Reflux:  None. Other Findings: Baker cyst RIGHT popliteal fossa 5.2 x 1.9 x 3.8 cm. Scattered subcutaneous edema. LEFT LOWER EXTREMITY Common Femoral Vein: Occlusive hypoechoic thrombus with impaired compressibility and absent spontaneous venous flow Saphenofemoral Junction: Thrombus at the saphenofemoral junction. Profunda Femoral Vein: No evidence of thrombus. Normal compressibility and flow on color Doppler imaging. Femoral Vein: Non occlusive hypoechoic thrombus proximally with occlusive thrombus distally Popliteal Vein: No evidence of thrombus. Normal compressibility, respiratory phasicity and response to augmentation. Calf Veins: No evidence of thrombus. Normal compressibility and flow on color Doppler imaging. Superficial Great Saphenous Vein: No evidence of thrombus in greater saphenous vein. Hypoechoic thrombus seen within lesser saphenous vein with impaired compressibility. Venous Reflux:  None. Other Findings:  Scattered subcutaneous edema IMPRESSION: Positive exam for presence  of acute appearing deep venous thrombosis in the lower extremities bilaterally involving BILATERAL common femoral veins, BILATERAL femoral veins, and RIGHT popliteal vein into RIGHT calf veins. Baker cyst RIGHT popliteal fossa. Electronically Signed   By: Ulyses Southward M.D.   On: 11/24/2017 13:12   Dg Foot Complete Left  Result Date: 11/24/2017 CLINICAL DATA:  Pain and swelling for 3 months EXAM: LEFT FOOT - COMPLETE 3+ VIEW COMPARISON:  None. FINDINGS: Significant diffuse soft tissue swelling. Osseous demineralization. Joint spaces preserved. Small vessel vascular calcifications. Calcaneal spurring. No acute fracture, dislocation or bone destruction. IMPRESSION: Significant soft tissue swelling and osseous demineralization without acute bony  abnormalities. Calcaneal spurring. Electronically Signed   By: Ulyses Southward M.D.   On: 11/24/2017 13:17   Dg Foot Complete Right  Result Date: 11/24/2017 CLINICAL DATA:  Pain and swelling for 3 months EXAM: RIGHT FOOT COMPLETE - 3+ VIEW COMPARISON:  None FINDINGS: Significant diffuse soft tissue edema. Osseous demineralization. Joint spaces preserved. No acute fracture, dislocation, or bone destruction. Small plantar calcaneal spur. Small vessel vascular calcifications. IMPRESSION: Osseous demineralization and significant soft tissue swelling without acute bony abnormalities. Calcaneal spur. Electronically Signed   By: Ulyses Southward M.D.   On: 11/24/2017 13:15    DVT Prophylaxis - iv Heparin AM Labs Ordered, also please review Full Orders  Family Communication: Admission, patients condition and plan of care including tests being ordered have been discussed with the patient and daughter... Judeth Cornfield who indicate understanding and agree with the plan   Code Status - Full Code  Likely DC to  Home Vs SNF   Condition   Fair, patient is high risk for decompensation  Shon Hale M.D on 11/24/2017 at 6:57 PM   Between 7am to 7pm - Pager - [phone] After 7pm go to [email or web address] - password [email or web address]  [facility] - Office  [phone]  Voice Recognition Reubin Milan dictation system was used to create this note, attempts have been made to correct errors. Please contact the author with questions and/or clarifications.

## 2017-11-24 NOTE — ED Provider Notes (Signed)
[facility] EMERGENCY DEPARTMENT Provider Note   CSN: [id number] Arrival date & time: 11/24/17  1014     History   Chief Complaint Chief Complaint  Patient presents with  . Leg Pain    HPI Patricia Ayala is a 84 y.o. female.  HPI  Pt was seen at 1020. Per EMS and pt, c/o gradual onset and persistence of constant bilat LE's "swelling" and "pains" for the past 3 months. Pt states she "takes pain pills" for her symptoms. States she came to the ED today because it "hurt to walk." EMS states pt walked through her house from her kitchen to EMS stretcher at scene without difficulty (walks with walker at baseline). Pt states her entire left foot is "numb," but she can walk on her left foot and "can feel it when people touch it." Pt also states her right heel "is red." Pt states these symptoms also have been occurring intermittently over the past 2 to 3 months. Denies any new symptoms today. Denies any change in her symptoms today. Denies abd pain, no back pain, no focal motor weakness, no CP/SOB, no fevers, no calf/LE unilateral swelling. LD HD yesterday.    Past Medical History:  Diagnosis Date  . Anemia 02/11/2012  . Anxiety   . Aortic stenosis    mild AS 10/2016 echo  . Arthritis    arthritis in neck  . Atrial fibrillation (HCC)   . CHF (congestive heart failure) (HCC)   . Chronic kidney disease    hemodialysis Tu/Th/Sa  . Dyspnea    on exertion  . GERD (gastroesophageal reflux disease)   . GI bleed 01/2017   due to gastric ulcer, taking NSAIDs while on coumadin  . H/O metabolic acidosis   . Headache   . Hyperlipidemia   . Hyperparathyroidism, secondary (HCC)   . Hypertension    Dr. Regino Schultze, [place], [place]  . Peptic ulcer   . Peripheral vascular disease (HCC)   . Vitamin D deficiency     Patient Active Problem List   Diagnosis Date Noted  . Delirium, acute 05/04/2017  . Delirium 05/04/2017  . Helicobacter pylori ab+   . Chronic gastric ulcer with hemorrhage   .  Acute post-traumatic headache, not intractable   . Acute blood loss anemia   . Chronic anticoagulation   . Encounter for therapeutic drug monitoring 12/03/2016  . Generalized anxiety disorder 11/12/2016  . Atrial fibrillation with RVR (HCC) 11/01/2016  . Acute respiratory failure (HCC) 11/01/2016  . URI (upper respiratory infection) 10/30/2016  . ESRD (end stage renal disease) (HCC) 10/29/2016  . Benign essential HTN 10/29/2016  . HLD (hyperlipidemia) 10/29/2016  . Acute on chronic diastolic CHF (congestive heart failure) (HCC) 10/29/2016  . Pulmonary edema 09/26/2016  . Mechanical complication of other vascular device, implant, and graft 08/07/2013  . Anemia 02/11/2012    Past Surgical History:  Procedure Laterality Date  . A/V FISTULAGRAM N/A 05/10/2017   Procedure: A/V Fistulagram;  Surgeon: Sherren Kerns, MD;  Location: [facility] INVASIVE CV LAB;  Service: Cardiovascular;  Laterality: N/A;  . A/V FISTULAGRAM Left 11/18/2017   Procedure: A/V FISTULAGRAM;  Surgeon: Fransisco Hertz, MD;  Location: [facility] INVASIVE CV LAB;  Service: Cardiovascular;  Laterality: Left;  . ABDOMINAL HYSTERECTOMY    . AV FISTULA PLACEMENT  04/01/2012   Procedure: ARTERIOVENOUS (AV) FISTULA CREATION;  Surgeon: Sherren Kerns, MD;  Location: [facility] OR;  Service: Vascular;  Laterality: Left;  . AV FISTULA PLACEMENT Left 06/24/2017   Procedure:  ARTERIOVENOUS (AV) FISTULA CREATION;  Surgeon: Maeola Harman, MD;  Location: [facility] OR;  Service: Vascular;  Laterality: Left;  . BASCILIC VEIN TRANSPOSITION Left 08/26/2017   Procedure: BASILIC VEIN TRANSPOSITION SECOND STAGE;  Surgeon: Maeola Harman, MD;  Location: [facility] OR;  Service: Vascular;  Laterality: Left;  . CHOLECYSTECTOMY    . COLON SURGERY     for a blockage  . ESOPHAGOGASTRODUODENOSCOPY N/A 02/17/2017   Procedure: ESOPHAGOGASTRODUODENOSCOPY (EGD);  Surgeon: Sherrilyn Rist, MD;  Location: [facility] ENDOSCOPY;  Service: Endoscopy;  Laterality: N/A;  Bedside  case in ICU  . EYE SURGERY     bilateral cataracts, /w IOL - 2013  . FISTULOGRAM N/A 11/05/2014   Procedure: FISTULOGRAM;  Surgeon: Larina Earthly, MD;  Location: [facility] CATH LAB;  Service: Cardiovascular;  Laterality: N/A;  . INSERTION OF DIALYSIS CATHETER  04/10/2012   Procedure: INSERTION OF DIALYSIS CATHETER;  Surgeon: Pryor Ochoa, MD;  Location: [facility] OR;  Service: Vascular;  Laterality: N/A;  Insertion Diatek Catheter Right Internal Jugular  . LIGATION OF COMPETING BRANCHES OF ARTERIOVENOUS FISTULA  11/07/2012   Procedure: LIGATION OF COMPETING BRANCHES OF ARTERIOVENOUS FISTULA;  Surgeon: Chuck Hint, MD;  Location: [facility] OR;  Service: Vascular;  Laterality: Left;  Brachio/Cephalic Fistula  . SHUNTOGRAM N/A 11/03/2012   Procedure: Betsey Amen;  Surgeon: Chuck Hint, MD;  Location: [facility] CATH LAB;  Service: Cardiovascular;  Laterality: N/A;  . SHUNTOGRAM Left 03/09/2013   Procedure: Fistulogram;  Surgeon: Fransisco Hertz, MD;  Location: [facility] CATH LAB;  Service: Cardiovascular;  Laterality: Left;  . SHUNTOGRAM Left 11/23/2013   Procedure: FISTULOGRAM;  Surgeon: Fransisco Hertz, MD;  Location: [facility] CATH LAB;  Service: Cardiovascular;  Laterality: Left;    OB History    No data available       Home Medications    Prior to Admission medications   Medication Sig Start Date End Date Taking? Authorizing Provider  ALPRAZolam Prudy Feeler) 0.5 MG tablet Take 0.5 mg by mouth 3 (three) times daily as needed for anxiety.   Yes [provider]  diltiazem (CARDIZEM) 60 MG tablet Take 60 mg by mouth daily.   Yes [provider]  doxycycline (VIBRA-TABS) 100 MG tablet Take 1 tablet by mouth 2 (two) times daily. 11/21/17  Yes [provider]  HYDROcodone-acetaminophen (NORCO) 10-325 MG tablet Take 1 tablet by mouth 4 (four) times daily as needed for moderate pain.    Yes [provider]  hydrOXYzine (ATARAX/VISTARIL) 25 MG tablet Take 1 tablet by mouth 4 (four) times daily as  needed for itching. 11/21/17  Yes [provider]  metoprolol tartrate (LOPRESSOR) 25 MG tablet Take 25 mg by mouth 2 (two) times daily.   Yes [provider]  multivitamin (RENA-VIT) TABS tablet Take 1 tablet by mouth daily.   Yes [provider]  promethazine (PHENERGAN) 25 MG tablet Take 25 mg by mouth every 6 (six) hours as needed for nausea or vomiting.   Yes [provider]  sevelamer (RENAGEL) 800 MG tablet Take 1,600 mg by mouth 3 (three) times daily with meals.   Yes [provider]    Family History Family History  Problem Relation Age of Onset  . Stroke Mother   . Cancer Father   . Heart attack Father     Social History Social History   Tobacco Use  . Smoking status: Never Smoker  . Smokeless tobacco: Never Used  Substance Use Topics  . Alcohol use: No  .  Drug use: No     Allergies   Coumadin [warfarin sodium]   Review of Systems Review of Systems ROS: Statement: All systems negative except as marked or noted in the HPI; Constitutional: Negative for fever and chills. ; ; Eyes: Negative for eye pain, redness and discharge. ; ; ENMT: Negative for ear pain, hoarseness, nasal congestion, sinus pressure and sore throat. ; ; Cardiovascular: Negative for chest pain, palpitations, diaphoresis, dyspnea and +peripheral edema. ; ; Respiratory: Negative for cough, wheezing and stridor. ; ; Gastrointestinal: Negative for nausea, vomiting, diarrhea, abdominal pain, blood in stool, hematemesis, jaundice and rectal bleeding. . ; ; Genitourinary: Negative for dysuria, flank pain and hematuria. ; ; Musculoskeletal: Negative for back pain and neck pain. Negative for swelling and trauma.; ; Skin: +rash. Negative for pruritus, abrasions, blisters, bruising and skin lesion.; ; Neuro: Negative for headache, lightheadedness and neck stiffness. Negative for weakness, altered level of consciousness, altered mental status, extremity weakness, paresthesias,  involuntary movement, seizure and syncope.       Physical Exam Updated Vital Signs BP 126/74 (BP Location: Right Arm)   Pulse (!) 114   Temp 97.9 F (36.6 C) (Oral)   Resp 16   Ht 5\' 4"  (1.626 m)   Wt 68 kg (150 lb)   SpO2 93%   BMI 25.75 kg/m    BP 109/74   Pulse 78   Temp 97.9 F (36.6 C) (Oral)   Resp 16   Ht 5\' 4"  (1.626 m)   Wt 68 kg (150 lb)   SpO2 97%   BMI 25.75 kg/m    Physical Exam 1025: Physical examination:  Nursing notes reviewed; Vital signs and O2 SAT reviewed;  Constitutional: Well developed, Well nourished, Well hydrated, In no acute distress; Head:  Normocephalic, atraumatic; Eyes: EOMI, PERRL, No scleral icterus; ENMT: Mouth and pharynx normal, Mucous membranes moist; Neck: Supple, Full range of motion, No lymphadenopathy; Cardiovascular: Regular rate and rhythm, No gallop; Respiratory: Breath sounds clear & equal bilaterally, No wheezes.  Speaking full sentences with ease, Normal respiratory effort/excursion; Chest: Nontender, Movement normal; Abdomen: Soft, Nontender, Nondistended, Normal bowel sounds; Genitourinary: No CVA tenderness; Spine:  No midline CS, TS, LS tenderness.;; Extremities: Pulses normal, pelvis stable. No deformity. +2 pedal edema from feet to knees. No calf asymmetry. Palp pedal pulses bilat. +faint erythema to right posterior heel, no streaking. No open wounds, no ecchymosis. NMS intact bilat LE's including feet. No sensory deficits. NT bilat hips/knees/ankles..; Neuro: AA&Ox3, Major CN grossly intact. No facial droop. Speech clear. Strength 5/5 equal bilat UE's and LE's. No gross focal motor or sensory deficits in extremities.; Skin: Color normal, Warm, Dry.   ED Treatments / Results  Labs (all labs ordered are listed, but only abnormal results are displayed)   EKG  EKG Interpretation None       Radiology   Procedures Procedures (including critical care time)  Medications Ordered in ED Medications - No data to  display   Initial Impression / Assessment and Plan / ED Course  I have reviewed the triage vital signs and the nursing notes.  Pertinent labs & imaging results that were available during my care of the patient were reviewed by me and considered in my medical decision making (see chart for details).  MDM Reviewed: previous chart, nursing note and vitals Reviewed previous: labs Interpretation: labs and x-ray    Results for orders placed or performed during the hospital encounter of 11/24/17  Comprehensive metabolic panel  Result Value Ref Range  Sodium 139 135 - 145 mmol/L   Potassium 3.6 3.5 - 5.1 mmol/L   Chloride 99 (L) 101 - 111 mmol/L   CO2 25 22 - 32 mmol/L   Glucose, Bld 120 (H) 65 - 99 mg/dL   BUN 20 6 - 20 mg/dL   Creatinine, Ser 1.61 (H) 0.44 - 1.00 mg/dL   Calcium 09.6 8.9 - 04.5 mg/dL   Total Protein 6.1 (L) 6.5 - 8.1 g/dL   Albumin 3.0 (L) 3.5 - 5.0 g/dL   AST 16 15 - 41 U/L   ALT 13 (L) 14 - 54 U/L   Alkaline Phosphatase 84 38 - 126 U/L   Total Bilirubin 0.7 0.3 - 1.2 mg/dL   GFR calc non Af Amer 8 (L) >60 mL/min   GFR calc Af Amer 9 (L) >60 mL/min   Anion gap 15 5 - 15  CBC with Differential  Result Value Ref Range   WBC 10.1 4.0 - 10.5 K/uL   RBC 4.13 3.87 - 5.11 MIL/uL   Hemoglobin 12.1 12.0 - 15.0 g/dL   HCT 40.9 81.1 - 91.4 %   MCV 97.6 78.0 - 100.0 fL   MCH 29.3 26.0 - 34.0 pg   MCHC 30.0 30.0 - 36.0 g/dL   RDW 78.2 95.6 - 21.3 %   Platelets 238 150 - 400 K/uL   Neutrophils Relative % 82 %   Neutro Abs 8.3 (H) 1.7 - 7.7 K/uL   Lymphocytes Relative 9 %   Lymphs Abs 0.9 0.7 - 4.0 K/uL   Monocytes Relative 8 %   Monocytes Absolute 0.8 0.1 - 1.0 K/uL   Eosinophils Relative 1 %   Eosinophils Absolute 0.1 0.0 - 0.7 K/uL   Basophils Relative 0 %   Basophils Absolute 0.0 0.0 - 0.1 K/uL  Lactic acid, plasma  Result Value Ref Range   Lactic Acid, Venous 1.7 0.5 - 1.9 mmol/L   Dg Ankle Complete Left Result Date: 11/24/2017 CLINICAL DATA:  Pain and  swelling for 3 months EXAM: LEFT ANKLE COMPLETE - 3+ VIEW COMPARISON:  None FINDINGS: Diffuse soft tissue swelling. Osseous demineralization. Joint spaces preserved. No acute fracture, dislocation, or bone destruction. Scattered soft tissue calcifications at lower leg including phleboliths and vascular calcifications extending into foot. IMPRESSION: Significant soft tissue swelling and osseous demineralization without acute bony abnormalities. Electronically Signed   By: Ulyses Southward M.D.   On: 11/24/2017 13:19   Dg Ankle Complete Right Result Date: 11/24/2017 CLINICAL DATA:  Pain and swelling for 3 months EXAM: RIGHT ANKLE - COMPLETE 3+ VIEW COMPARISON:  None FINDINGS: Marked soft tissue swelling. Diffuse osseous demineralization. Joint spaces preserved. No acute fracture, dislocation, or bone destruction. Small vessel vascular calcifications extending into foot. IMPRESSION: Significant soft tissue swelling and osseous demineralization without acute bony abnormality. Electronically Signed   By: Ulyses Southward M.D.   On: 11/24/2017 13:19   US Venous Img Lower Bilateral Result Date: 11/24/2017 CLINICAL DATA:  Pain and swelling in both lower extremities for 3 months EXAM: BILATERAL LOWER EXTREMITY VENOUS DOPPLER ULTRASOUND TECHNIQUE: Gray-scale sonography with graded compression, as well as color Doppler and duplex ultrasound were performed to evaluate the lower extremity deep venous systems from the level of the common femoral vein and including the common femoral, femoral, profunda femoral, popliteal and calf veins including the posterior tibial, peroneal and gastrocnemius veins when visible. The superficial great saphenous vein was also interrogated. Spectral Doppler was utilized to evaluate flow at rest and with distal augmentation maneuvers in  the common femoral, femoral and popliteal veins. COMPARISON:  11/06/2016 FINDINGS: RIGHT LOWER EXTREMITY Common Femoral Vein: Nonocclusive hypoechoic thrombus with  impaired compressibility and diminished flow Saphenofemoral Junction: No evidence of thrombus. Normal compressibility and flow on color Doppler imaging. Profunda Femoral Vein: No evidence of thrombus. Normal compressibility and flow on color Doppler imaging. Femoral Vein: Nonocclusive hypoechoic thrombus with impaired compressibility and diminished spontaneous venous flow Popliteal Vein: Nonocclusive hypoechoic thrombus with impaired compressibility and diminished spontaneous venous flow Calf Veins: Occlusive thrombus of the perineal and posterior tibial veins Superficial Great Saphenous Vein: No evidence of thrombus. Normal compressibility. Venous Reflux:  None. Other Findings: Baker cyst RIGHT popliteal fossa 5.2 x 1.9 x 3.8 cm. Scattered subcutaneous edema. LEFT LOWER EXTREMITY Common Femoral Vein: Occlusive hypoechoic thrombus with impaired compressibility and absent spontaneous venous flow Saphenofemoral Junction: Thrombus at the saphenofemoral junction. Profunda Femoral Vein: No evidence of thrombus. Normal compressibility and flow on color Doppler imaging. Femoral Vein: Non occlusive hypoechoic thrombus proximally with occlusive thrombus distally Popliteal Vein: No evidence of thrombus. Normal compressibility, respiratory phasicity and response to augmentation. Calf Veins: No evidence of thrombus. Normal compressibility and flow on color Doppler imaging. Superficial Great Saphenous Vein: No evidence of thrombus in greater saphenous vein. Hypoechoic thrombus seen within lesser saphenous vein with impaired compressibility. Venous Reflux:  None. Other Findings:  Scattered subcutaneous edema IMPRESSION: Positive exam for presence of acute appearing deep venous thrombosis in the lower extremities bilaterally involving BILATERAL common femoral veins, BILATERAL femoral veins, and RIGHT popliteal vein into RIGHT calf veins. Baker cyst RIGHT popliteal fossa. Electronically Signed   By: Ulyses Southward M.D.   On:  11/24/2017 13:12   Dg Foot Complete Left Result Date: 11/24/2017 CLINICAL DATA:  Pain and swelling for 3 months EXAM: LEFT FOOT - COMPLETE 3+ VIEW COMPARISON:  None. FINDINGS: Significant diffuse soft tissue swelling. Osseous demineralization. Joint spaces preserved. Small vessel vascular calcifications. Calcaneal spurring. No acute fracture, dislocation or bone destruction. IMPRESSION: Significant soft tissue swelling and osseous demineralization without acute bony abnormalities. Calcaneal spurring. Electronically Signed   By: Ulyses Southward M.D.   On: 11/24/2017 13:17   Dg Foot Complete Right Result Date: 11/24/2017 CLINICAL DATA:  Pain and swelling for 3 months EXAM: RIGHT FOOT COMPLETE - 3+ VIEW COMPARISON:  None FINDINGS: Significant diffuse soft tissue edema. Osseous demineralization. Joint spaces preserved. No acute fracture, dislocation, or bone destruction. Small plantar calcaneal spur. Small vessel vascular calcifications. IMPRESSION: Osseous demineralization and significant soft tissue swelling without acute bony abnormalities. Calcaneal spur. Electronically Signed   By: Ulyses Southward M.D.   On: 11/24/2017 13:15     1025:  With ED RN x2, ED Tech and I in exam room:  Pt states her "entire left foot is numb" but she says "ow" when someone touches either foot and walked easily with EMS.  I also had pt close her eyes and I palpated misc areas on her RLE and LLE, including both feet. Pt was able to name precisely where I was touching (including left foot and toes). ED RN and I tried to clarify pt's c/o "numbness" to her entire foot, but she cannot clarify.   1330:  Pt asked me if I was "in here earlier with the other people." I said I was, as well as 2 other ED RN's and an ED Tech. Pt stated to me and pt's current ED RN that she "knew what you said to me" and "I didn't like it." I asked her what I  said and she cannot tell me, just "I know what I heard." ED RN and I again asked pt what she thought was  said, and she only repeats "I know it," and "I know what I heard." I told her that I did not know what could have possibly been said that she did not like, but I apologized regardless. She said "that's right, that's all that's needed."   1400:  Dx and testing d/w pt and family.  Questions answered.  Verb understanding, agreeable to admit. T/C to Vascular Surgery Dr. Darrick Penna, case discussed, including:  HPI, pertinent PM/SHx, VS/PE, dx testing, ED course and treatment:  States pt does not qualify for more aggressive intervention at this time, anticoagulant is the appropriate therapy, start anticoagulant and admit to monitor H/H (given hx GIB), OK to stay at [facility] for admit to monitor H/H.   1425:  T/C to [facility] Dr. Mariea Clonts, case discussed, including:  HPI, pertinent PM/SHx, VS/PE, dx testing, ED course and treatment, as well as d/w Vascular MD:  Agreeable to admit.  Pt and family updated.     Final Clinical Impressions(s) / ED Diagnoses   Final diagnoses:  None    ED Discharge Orders    None       Samuel Jester, DO 11/27/17 1544

## 2017-11-24 NOTE — ED Notes (Signed)
Patient transported to Ultrasound 

## 2017-11-24 NOTE — ED Notes (Signed)
Pt has not received heparin bolus due to IV infiltrated, pt with c/o pain to site

## 2017-11-24 NOTE — Progress Notes (Signed)
ANTICOAGULATION CONSULT NOTE - Initial Consult  Pharmacy Consult for HEPARIN Indication: VTE treatment  Allergies  Allergen Reactions  . Coumadin [Warfarin Sodium]     Caused GIB   Patient Measurements: Height: 5\' 4"  (162.6 cm) Weight: 150 lb (68 kg) IBW/kg (Calculated) : 54.7 HEPARIN DW (KG): 68  Vital Signs: Temp: 97.9 F (36.6 C) (01/27 1019) Temp Source: Oral (01/27 1019) BP: 109/74 (01/27 1349) Pulse Rate: 78 (01/27 1352)  Labs: Recent Labs    11/24/17 1053  HGB 12.1  HCT 40.3  PLT 238  CREATININE 4.63*   Estimated Creatinine Clearance: 8.1 mL/min (A) (by C-G formula based on SCr of 4.63 mg/dL (H)).  Medical History: Past Medical History:  Diagnosis Date  . Anemia 02/11/2012  . Anxiety   . Aortic stenosis    mild AS 10/2016 echo  . Arthritis    arthritis in neck  . Atrial fibrillation (HCC)   . CHF (congestive heart failure) (HCC)   . Chronic kidney disease    hemodialysis Tu/Th/Sa  . Dyspnea    on exertion  . GERD (gastroesophageal reflux disease)   . GI bleed 01/2017   due to gastric ulcer, taking NSAIDs while on coumadin  . H/O metabolic acidosis   . Headache   . Hyperlipidemia   . Hyperparathyroidism, secondary (HCC)   . Hypertension    Dr. Regino Schultze, [place], [place]  . Peptic ulcer   . Peripheral vascular disease (HCC)   . Vitamin D deficiency    Medications:   (Not in a hospital admission)  Assessment: Per EMS, pt received dialysis yesterday and reports leg pain ever since. Pt reports history of same with swelling and reports today pain is more intense. Pt reports left foot feels numb since waking this am. Pt not on anticoagulants PTA.   Goal of Therapy:  Heparin level 0.3-0.7 units/ml Monitor platelets by anticoagulation protocol: Yes   Plan:   Heparin 4000 units IV now x 1  Heparin infusion at 1150 units/hr  Heparin level in 6-8 hrs then daily  CBC daily while on Heparin   Margo Aye, Nadege Carriger A 11/24/2017,2:40 PM

## 2017-11-24 NOTE — ED Triage Notes (Addendum)
Per EMS, pt received dialysis yesterday and reports leg pain ever since. Pt reports history of same with swelling and reports today pain is more intense. Pt reports left foot feels numb since waking this am. Pt ambulated to EMS stretcher at time of their arrival and is able to bear weight on BLE to ED stretcher. Pt uses walker at home. Moderate swelling noted in BLE.pt denies any known injury.  Faint DP pulse palpated in RLE, moderate DP pulse palpated in LLE. EDP at bedside.

## 2017-11-25 ENCOUNTER — Inpatient Hospital Stay (HOSPITAL_COMMUNITY): Payer: Medicare Other

## 2017-11-25 DIAGNOSIS — N186 End stage renal disease: Secondary | ICD-10-CM

## 2017-11-25 DIAGNOSIS — I480 Paroxysmal atrial fibrillation: Secondary | ICD-10-CM

## 2017-11-25 DIAGNOSIS — Z992 Dependence on renal dialysis: Secondary | ICD-10-CM

## 2017-11-25 DIAGNOSIS — I82493 Acute embolism and thrombosis of other specified deep vein of lower extremity, bilateral: Secondary | ICD-10-CM

## 2017-11-25 LAB — BASIC METABOLIC PANEL
Anion gap: 14 (ref 5–15)
BUN: 27 mg/dL — ABNORMAL HIGH (ref 6–20)
CALCIUM: 9.5 mg/dL (ref 8.9–10.3)
CHLORIDE: 102 mmol/L (ref 101–111)
CO2: 24 mmol/L (ref 22–32)
CREATININE: 5.48 mg/dL — AB (ref 0.44–1.00)
GFR calc non Af Amer: 6 mL/min — ABNORMAL LOW (ref >60)
GFR, EST AFRICAN AMERICAN: 7 mL/min — AB (ref >60)
GLUCOSE: 82 mg/dL (ref 65–99)
Potassium: 3.5 mmol/L (ref 3.5–5.1)
Sodium: 140 mmol/L (ref 135–145)

## 2017-11-25 LAB — CBC
HCT: 37.9 % (ref 36.0–46.0)
HEMOGLOBIN: 11.4 g/dL — AB (ref 12.0–15.0)
MCH: 28.9 pg (ref 26.0–34.0)
MCHC: 30.1 g/dL (ref 30.0–36.0)
MCV: 95.9 fL (ref 78.0–100.0)
PLATELETS: 257 10*3/uL (ref 150–400)
RBC: 3.95 MIL/uL (ref 3.87–5.11)
RDW: 14.3 % (ref 11.5–15.5)
WBC: 8 10*3/uL (ref 4.0–10.5)

## 2017-11-25 LAB — HEPARIN LEVEL (UNFRACTIONATED)
HEPARIN UNFRACTIONATED: 1.02 [IU]/mL — AB (ref 0.30–0.70)
Heparin Unfractionated: 0.95 IU/mL — ABNORMAL HIGH (ref 0.30–0.70)

## 2017-11-25 NOTE — Consult Note (Signed)
Reason for Consult:ESRD Referring Physician: Dr. Angela Cox is an 84 y.o. female.  HPI: She is a patient who has history of hypertension, atrial fibrillation, end-stage renal disease on maintenance hemodialysis presently came with complaints of bilateral leg swelling and pain for the last couple of months.  Patient with history of neuropathy and most of the time complains of pain of both legs.  Recently however her leg swelling seems to be increasing without having any tenderness.  Patient also with hypotension and difficult to remove fluid during dialysis.  Presently however when she was evaluated she was found to have bilateral DVT and admitted to the hospital.  Patient presently states that she is feeling much better.  She denies any difficulty breathing and no chest pain.  Patient also denies any nausea or vomiting.  Past Medical History:  Diagnosis Date  . Anemia 02/11/2012  . Anxiety   . Aortic stenosis    mild AS 10/2016 echo  . Arthritis    arthritis in neck  . Atrial fibrillation ([place])   . CHF (congestive heart failure) ([place])   . Chronic kidney disease    hemodialysis Tu/Th/Sa  . Dyspnea    on exertion  . GERD (gastroesophageal reflux disease)   . GI bleed 01/2017   due to gastric ulcer, taking NSAIDs while on coumadin  . H/O metabolic acidosis   . Headache   . Hyperlipidemia   . Hyperparathyroidism, secondary ([place])   . Hypertension    Dr. Orson Ape, Linna Hoff, [place]  . Peptic ulcer   . Peripheral vascular disease ([place])   . Vitamin D deficiency     Past Surgical History:  Procedure Laterality Date  . A/V FISTULAGRAM N/A 05/10/2017   Procedure: A/V Fistulagram;  Surgeon: Elam Dutch, MD;  Location: [place] [facility];  Service: Cardiovascular;  Laterality: N/A;  . A/V FISTULAGRAM Left 11/18/2017   Procedure: A/V FISTULAGRAM;  Surgeon: Conrad [place], MD;  Location: [place] [facility];  Service: Cardiovascular;  Laterality: Left;  . ABDOMINAL HYSTERECTOMY     . AV FISTULA PLACEMENT  04/01/2012   Procedure: ARTERIOVENOUS (AV) FISTULA CREATION;  Surgeon: Elam Dutch, MD;  Location: [place];  Service: Vascular;  Laterality: Left;  . AV FISTULA PLACEMENT Left 06/24/2017   Procedure: ARTERIOVENOUS (AV) FISTULA CREATION;  Surgeon: Waynetta Sandy, MD;  Location: [place];  Service: Vascular;  Laterality: Left;  . BASCILIC VEIN TRANSPOSITION Left 08/26/2017   Procedure: BASILIC VEIN TRANSPOSITION SECOND STAGE;  Surgeon: Waynetta Sandy, MD;  Location: [place];  Service: Vascular;  Laterality: Left;  . CHOLECYSTECTOMY    . COLON SURGERY     for a blockage  . ESOPHAGOGASTRODUODENOSCOPY N/A 02/17/2017   Procedure: ESOPHAGOGASTRODUODENOSCOPY (EGD);  Surgeon: Doran Stabler, MD;  Location: [facility] ENDOSCOPY;  Service: Endoscopy;  Laterality: N/A;  Bedside case in ICU  . EYE SURGERY     bilateral cataracts, /w IOL - 2013  . FISTULOGRAM N/A 11/05/2014   Procedure: FISTULOGRAM;  Surgeon: Rosetta Posner, MD;  Location: [facility] LAB;  Service: Cardiovascular;  Laterality: N/A;  . INSERTION OF DIALYSIS CATHETER  04/10/2012   Procedure: INSERTION OF DIALYSIS CATHETER;  Surgeon: Mal Misty, MD;  Location: [place];  Service: Vascular;  Laterality: N/A;  Insertion Diatek Catheter Right Internal Jugular  . LIGATION OF COMPETING BRANCHES OF ARTERIOVENOUS FISTULA  11/07/2012   Procedure: LIGATION OF COMPETING BRANCHES OF ARTERIOVENOUS FISTULA;  Surgeon: Angelia Mould, MD;  Location:  [facility];  Service: Vascular;  Laterality: Left;  Brachio/Cephalic Fistula  . SHUNTOGRAM N/A 11/03/2012   Procedure: Earney Mallet;  Surgeon: Angelia Mould, MD;  Location: [facility] CATH LAB;  Service: Cardiovascular;  Laterality: N/A;  . SHUNTOGRAM Left 03/09/2013   Procedure: Fistulogram;  Surgeon: Conrad [place], MD;  Location: [facility] CATH LAB;  Service: Cardiovascular;  Laterality: Left;  . SHUNTOGRAM Left 11/23/2013   Procedure: FISTULOGRAM;  Surgeon: Conrad [place], MD;  Location: [facility]  CATH LAB;  Service: Cardiovascular;  Laterality: Left;    Family History  Problem Relation Age of Onset  . Stroke Mother   . Cancer Father   . Heart attack Father     Social History:  reports that  has never smoked. she has never used smokeless tobacco. She reports that she does not drink alcohol or use drugs.  Allergies:  Allergies  Allergen Reactions  . Coumadin [Warfarin Sodium]     Caused GIB    Medications: I have reviewed the patient's current medications.  Results for orders placed or performed during the hospital encounter of 11/24/17 (from the past 48 hour(s))  Comprehensive metabolic panel     Status: Abnormal   Collection Time: 11/24/17 10:53 AM  Result Value Ref Range   Sodium 139 135 - 145 mmol/L   Potassium 3.6 3.5 - 5.1 mmol/L   Chloride 99 (L) 101 - 111 mmol/L   CO2 25 22 - 32 mmol/L   Glucose, Bld 120 (H) 65 - 99 mg/dL   BUN 20 6 - 20 mg/dL   Creatinine, Ser 4.63 (H) 0.44 - 1.00 mg/dL   Calcium 10.2 8.9 - 10.3 mg/dL   Total Protein 6.1 (L) 6.5 - 8.1 g/dL   Albumin 3.0 (L) 3.5 - 5.0 g/dL   AST 16 15 - 41 U/L   ALT 13 (L) 14 - 54 U/L   Alkaline Phosphatase 84 38 - 126 U/L   Total Bilirubin 0.7 0.3 - 1.2 mg/dL   GFR calc non Af Amer 8 (L) >60 mL/min   GFR calc Af Amer 9 (L) >60 mL/min    Comment: (NOTE) The eGFR has been calculated using the CKD EPI equation. This calculation has not been validated in all clinical situations. eGFR's persistently <60 mL/min signify possible Chronic Kidney Disease.    Anion gap 15 5 - 15  CBC with Differential     Status: Abnormal   Collection Time: 11/24/17 10:53 AM  Result Value Ref Range   WBC 10.1 4.0 - 10.5 K/uL   RBC 4.13 3.87 - 5.11 MIL/uL   Hemoglobin 12.1 12.0 - 15.0 g/dL   HCT 40.3 36.0 - 46.0 %   MCV 97.6 78.0 - 100.0 fL   MCH 29.3 26.0 - 34.0 pg   MCHC 30.0 30.0 - 36.0 g/dL   RDW 14.5 11.5 - 15.5 %   Platelets 238 150 - 400 K/uL   Neutrophils Relative % 82 %   Neutro Abs 8.3 (H) 1.7 - 7.7 K/uL    Lymphocytes Relative 9 %   Lymphs Abs 0.9 0.7 - 4.0 K/uL   Monocytes Relative 8 %   Monocytes Absolute 0.8 0.1 - 1.0 K/uL   Eosinophils Relative 1 %   Eosinophils Absolute 0.1 0.0 - 0.7 K/uL   Basophils Relative 0 %   Basophils Absolute 0.0 0.0 - 0.1 K/uL  Lactic acid, plasma     Status: None   Collection Time: 11/24/17 10:53 AM  Result Value Ref Range   Lactic Acid,  Venous 1.7 0.5 - 1.9 mmol/L  APTT     Status: None   Collection Time: 11/24/17 10:53 AM  Result Value Ref Range   aPTT 34 24 - 36 seconds  Protime-INR     Status: None   Collection Time: 11/24/17 10:53 AM  Result Value Ref Range   Prothrombin Time 12.8 11.4 - 15.2 seconds   INR 0.97   Lactic acid, plasma     Status: None   Collection Time: 11/24/17  1:11 PM  Result Value Ref Range   Lactic Acid, Venous 1.8 0.5 - 1.9 mmol/L  Heparin level (unfractionated)     Status: Abnormal   Collection Time: 11/24/17  8:08 PM  Result Value Ref Range   Heparin Unfractionated 0.91 (H) 0.30 - 0.70 IU/mL    Comment:        IF HEPARIN RESULTS ARE BELOW EXPECTED VALUES, AND PATIENT DOSAGE HAS BEEN CONFIRMED, SUGGEST FOLLOW UP TESTING OF ANTITHROMBIN III LEVELS.   Basic metabolic panel     Status: Abnormal   Collection Time: 11/25/17  5:47 AM  Result Value Ref Range   Sodium 140 135 - 145 mmol/L   Potassium 3.5 3.5 - 5.1 mmol/L   Chloride 102 101 - 111 mmol/L   CO2 24 22 - 32 mmol/L   Glucose, Bld 82 65 - 99 mg/dL   BUN 27 (H) 6 - 20 mg/dL   Creatinine, Ser 5.48 (H) 0.44 - 1.00 mg/dL   Calcium 9.5 8.9 - 10.3 mg/dL   GFR calc non Af Amer 6 (L) >60 mL/min   GFR calc Af Amer 7 (L) >60 mL/min    Comment: (NOTE) The eGFR has been calculated using the CKD EPI equation. This calculation has not been validated in all clinical situations. eGFR's persistently <60 mL/min signify possible Chronic Kidney Disease.    Anion gap 14 5 - 15  CBC     Status: Abnormal   Collection Time: 11/25/17  5:47 AM  Result Value Ref Range   WBC  8.0 4.0 - 10.5 K/uL   RBC 3.95 3.87 - 5.11 MIL/uL   Hemoglobin 11.4 (L) 12.0 - 15.0 g/dL   HCT 37.9 36.0 - 46.0 %   MCV 95.9 78.0 - 100.0 fL   MCH 28.9 26.0 - 34.0 pg   MCHC 30.1 30.0 - 36.0 g/dL   RDW 14.3 11.5 - 15.5 %   Platelets 257 150 - 400 K/uL  Heparin level (unfractionated)     Status: Abnormal   Collection Time: 11/25/17  5:47 AM  Result Value Ref Range   Heparin Unfractionated 1.02 (H) 0.30 - 0.70 IU/mL    Comment:        IF HEPARIN RESULTS ARE BELOW EXPECTED VALUES, AND PATIENT DOSAGE HAS BEEN CONFIRMED, SUGGEST FOLLOW UP TESTING OF ANTITHROMBIN III LEVELS.     Dg Ankle Complete Left  Result Date: 11/24/2017 CLINICAL DATA:  Pain and swelling for 3 months EXAM: LEFT ANKLE COMPLETE - 3+ VIEW COMPARISON:  None FINDINGS: Diffuse soft tissue swelling. Osseous demineralization. Joint spaces preserved. No acute fracture, dislocation, or bone destruction. Scattered soft tissue calcifications at lower leg including phleboliths and vascular calcifications extending into foot. IMPRESSION: Significant soft tissue swelling and osseous demineralization without acute bony abnormalities. Electronically Signed   By: Lavonia Dana M.D.   On: 11/24/2017 13:19   Dg Ankle Complete Right  Result Date: 11/24/2017 CLINICAL DATA:  Pain and swelling for 3 months EXAM: RIGHT ANKLE - COMPLETE 3+ VIEW COMPARISON:  None  FINDINGS: Marked soft tissue swelling. Diffuse osseous demineralization. Joint spaces preserved. No acute fracture, dislocation, or bone destruction. Small vessel vascular calcifications extending into foot. IMPRESSION: Significant soft tissue swelling and osseous demineralization without acute bony abnormality. Electronically Signed   By: Lavonia Dana M.D.   On: 11/24/2017 13:19   US Venous Img Lower Bilateral  Result Date: 11/24/2017 CLINICAL DATA:  Pain and swelling in both lower extremities for 3 months EXAM: BILATERAL LOWER EXTREMITY VENOUS DOPPLER ULTRASOUND TECHNIQUE: Gray-scale  sonography with graded compression, as well as color Doppler and duplex ultrasound were performed to evaluate the lower extremity deep venous systems from the level of the common femoral vein and including the common femoral, femoral, profunda femoral, popliteal and calf veins including the posterior tibial, peroneal and gastrocnemius veins when visible. The superficial great saphenous vein was also interrogated. Spectral Doppler was utilized to evaluate flow at rest and with distal augmentation maneuvers in the common femoral, femoral and popliteal veins. COMPARISON:  11/06/2016 FINDINGS: RIGHT LOWER EXTREMITY Common Femoral Vein: Nonocclusive hypoechoic thrombus with impaired compressibility and diminished flow Saphenofemoral Junction: No evidence of thrombus. Normal compressibility and flow on color Doppler imaging. Profunda Femoral Vein: No evidence of thrombus. Normal compressibility and flow on color Doppler imaging. Femoral Vein: Nonocclusive hypoechoic thrombus with impaired compressibility and diminished spontaneous venous flow Popliteal Vein: Nonocclusive hypoechoic thrombus with impaired compressibility and diminished spontaneous venous flow Calf Veins: Occlusive thrombus of the perineal and posterior tibial veins Superficial Great Saphenous Vein: No evidence of thrombus. Normal compressibility. Venous Reflux:  None. Other Findings: Baker cyst RIGHT popliteal fossa 5.2 x 1.9 x 3.8 cm. Scattered subcutaneous edema. LEFT LOWER EXTREMITY Common Femoral Vein: Occlusive hypoechoic thrombus with impaired compressibility and absent spontaneous venous flow Saphenofemoral Junction: Thrombus at the saphenofemoral junction. Profunda Femoral Vein: No evidence of thrombus. Normal compressibility and flow on color Doppler imaging. Femoral Vein: Non occlusive hypoechoic thrombus proximally with occlusive thrombus distally Popliteal Vein: No evidence of thrombus. Normal compressibility, respiratory phasicity and response  to augmentation. Calf Veins: No evidence of thrombus. Normal compressibility and flow on color Doppler imaging. Superficial Great Saphenous Vein: No evidence of thrombus in greater saphenous vein. Hypoechoic thrombus seen within lesser saphenous vein with impaired compressibility. Venous Reflux:  None. Other Findings:  Scattered subcutaneous edema IMPRESSION: Positive exam for presence of acute appearing deep venous thrombosis in the lower extremities bilaterally involving BILATERAL common femoral veins, BILATERAL femoral veins, and RIGHT popliteal vein into RIGHT calf veins. Baker cyst RIGHT popliteal fossa. Electronically Signed   By: Lavonia Dana M.D.   On: 11/24/2017 13:12   Dg Foot Complete Left  Result Date: 11/24/2017 CLINICAL DATA:  Pain and swelling for 3 months EXAM: LEFT FOOT - COMPLETE 3+ VIEW COMPARISON:  None. FINDINGS: Significant diffuse soft tissue swelling. Osseous demineralization. Joint spaces preserved. Small vessel vascular calcifications. Calcaneal spurring. No acute fracture, dislocation or bone destruction. IMPRESSION: Significant soft tissue swelling and osseous demineralization without acute bony abnormalities. Calcaneal spurring. Electronically Signed   By: Lavonia Dana M.D.   On: 11/24/2017 13:17   Dg Foot Complete Right  Result Date: 11/24/2017 CLINICAL DATA:  Pain and swelling for 3 months EXAM: RIGHT FOOT COMPLETE - 3+ VIEW COMPARISON:  None FINDINGS: Significant diffuse soft tissue edema. Osseous demineralization. Joint spaces preserved. No acute fracture, dislocation, or bone destruction. Small plantar calcaneal spur. Small vessel vascular calcifications. IMPRESSION: Osseous demineralization and significant soft tissue swelling without acute bony abnormalities. Calcaneal spur. Electronically Signed   By: Elta Gu[place]oupe  Th[place]ton Papas M.D.   On: 11/24/2017 13:15    Review of Systems  Constitutional: Positive for malaise/fatigue. Negative for chills and fever.  Respiratory: Negative for  hemoptysis and shortness of breath.   Cardiovascular: Positive for leg swelling. Negative for chest pain, palpitations and orthopnea.  Gastrointestinal: Negative for abdominal pain, nausea and vomiting.  Neurological: Negative for dizziness and headaches.   Blood pressure 123/90, pulse 78, temperature 98.6 F (37 C), temperature source Oral, resp. rate 17, height '5\' 4"'  (1.626 m), weight 67.6 kg (149 lb), SpO2 99 %. Physical Exam  Constitutional: She is oriented to person, place, and time. No distress.  Eyes: No scleral icterus.  Neck: No JVD present.  Cardiovascular: Normal rate and regular rhythm.  Respiratory: No respiratory distress. She has no wheezes.  GI: She exhibits no distension. There is no tenderness.  Musculoskeletal: She exhibits edema.  Neurological: She is alert and oriented to person, place, and time.  Skin: No erythema.    Assessment/Plan: 1] bilateral DVT.  Patient with history of atrial fibrillation and she was on Coumadin before.  The Coumadin also stopped because of severe upper GI bleeding.  However during that time patient was also taking non-steroidal and that might have contributed to her GI bleeding.  Since then no history of bleeding.  Presently she is on heparin. 2] end-stage renal disease: She is status post hemodialysis on Saturday.  Presently her potassium is good and patient does not have any uremic signs and symptoms. 3] atrial fibrillation: Patient is on Cardizem and her heart rate is controlled 4] history of hypotension and mostly during dialysis.  Told to be possibly from cardiac exam and dose of Cardizem has been reduced to 60 mg once a day.  Presently she is on metoprolol 25 mg p.o. twice daily Her blood pressures seems to be somewhat better. 5] anemia: Her hemoglobin is below our target goal 6] bone and mineral disorder her calcium is a range 7] fluid management patient presently is a symptomatic.  Except leg swelling patient does not have any  significant sign of fluid overload.  Her leg swelling has improved. Plan: Patient does not require dialysis today 2] will make arrangement for her to get dialysis tomorrow which is her regular schedule. 3] will remove 3-4/9 L if systolic blood pressure remains above 90 4] we will check a renal panel in the morning.  Lydia Meng S 11/25/2017, 10:23 AM

## 2017-11-25 NOTE — Progress Notes (Signed)
ANTICOAGULATION CONSULT NOTE - follow up  Pharmacy Consult for HEPARIN Indication: VTE treatment  Allergies  Allergen Reactions  . Coumadin [Warfarin Sodium]     Caused GIB   Patient Measurements: Height: 5\' 4"  (162.6 cm) Weight: 149 lb (67.6 kg) IBW/kg (Calculated) : 54.7 HEPARIN DW (KG): 67.6  Vital Signs: Temp: 98.6 F (37 C) (01/28 0511) Temp Source: Oral (01/28 0511) BP: 123/90 (01/28 0511) Pulse Rate: 78 (01/28 0511)  Labs: Recent Labs    11/24/17 1053 11/24/17 2008 11/25/17 0547  HGB 12.1  --  11.4*  HCT 40.3  --  37.9  PLT 238  --  257  APTT 34  --   --   LABPROT 12.8  --   --   INR 0.97  --   --   HEPARINUNFRC  --  0.91* 1.02*  CREATININE 4.63*  --  5.48*   Estimated Creatinine Clearance: 6.8 mL/min (A) (by C-G formula based on SCr of 5.48 mg/dL (H)).  Medical History: Past Medical History:  Diagnosis Date  . Anemia 02/11/2012  . Anxiety   . Aortic stenosis    mild AS 10/2016 echo  . Arthritis    arthritis in neck  . Atrial fibrillation ([facility])   . CHF (congestive heart failure) ([facility])   . Chronic kidney disease    hemodialysis Tu/Th/Sa  . Dyspnea    on exertion  . GERD (gastroesophageal reflux disease)   . GI bleed 01/2017   due to gastric ulcer, taking NSAIDs while on coumadin  . H/O metabolic acidosis   . Headache   . Hyperlipidemia   . Hyperparathyroidism, secondary ([facility])   . Hypertension    Dr. Regino Schultze, [place], [place]  . Peptic ulcer   . Peripheral vascular disease ([facility])   . Vitamin D deficiency    Medications:  Medications Prior to Admission  Medication Sig Dispense Refill Last Dose  . ALPRAZolam (XANAX) 0.5 MG tablet Take 0.5 mg by mouth 3 (three) times daily as needed for anxiety.   11/22/2017 at Unknown time  . diltiazem (CARDIZEM) 60 MG tablet Take 60 mg by mouth daily.   11/23/2017 at Unknown time  . doxycycline (VIBRA-TABS) 100 MG tablet Take 1 tablet by mouth 2 (two) times daily.   11/24/2017 at Unknown time  .  HYDROcodone-acetaminophen (NORCO) 10-325 MG tablet Take 1 tablet by mouth 4 (four) times daily as needed for moderate pain.    11/24/2017 at Unknown time  . hydrOXYzine (ATARAX/VISTARIL) 25 MG tablet Take 1 tablet by mouth 4 (four) times daily as needed for itching.   11/24/2017 at Unknown time  . metoprolol tartrate (LOPRESSOR) 25 MG tablet Take 25 mg by mouth 2 (two) times daily.   11/24/2017 at 0730  . multivitamin (RENA-VIT) TABS tablet Take 1 tablet by mouth daily.   11/23/2017 at Unknown time  . promethazine (PHENERGAN) 25 MG tablet Take 25 mg by mouth every 6 (six) hours as needed for nausea or vomiting.   unknown  . sevelamer (RENAGEL) 800 MG tablet Take 1,600 mg by mouth 3 (three) times daily with meals.   11/23/2017 at Unknown time    Assessment: Per EMS, pt received dialysis yesterday and reports leg pain ever since. Pt reports history of same with swelling and reports today pain is more intense. Pt reports left foot feels numb since waking this am. Pt not on anticoagulants PTA.  Heparin level is above goal.   Goal of Therapy:  Heparin level 0.3-0.7 units/ml Monitor platelets by anticoagulation  protocol: Yes   Plan:   Decrease Heparin infusion to 950 units/hr  Heparin level in 6-8 hrs then daily  CBC daily while on Heparin   Margo Aye, Litha Lamartina A 11/25/2017,7:27 AM

## 2017-11-25 NOTE — Progress Notes (Signed)
ANTICOAGULATION CONSULT NOTE - follow up  Pharmacy Consult for HEPARIN Indication: VTE treatment  Allergies  Allergen Reactions  . Coumadin [Warfarin Sodium]     Caused GIB   Patient Measurements: Height: 5\' 4"  (162.6 cm) Weight: 149 lb (67.6 kg) IBW/kg (Calculated) : 54.7 HEPARIN DW (KG): 67.6  Vital Signs: Temp: 98.3 F (36.8 C) (01/28 1409) Temp Source: Oral (01/28 1409) BP: 125/76 (01/28 1409) Pulse Rate: 84 (01/28 1409)  Labs: Recent Labs    11/24/17 1053 11/24/17 2008 11/25/17 0547 11/25/17 1403  HGB 12.1  --  11.4*  --   HCT 40.3  --  37.9  --   PLT 238  --  257  --   APTT 34  --   --   --   LABPROT 12.8  --   --   --   INR 0.97  --   --   --   HEPARINUNFRC  --  0.91* 1.02* 0.95*  CREATININE 4.63*  --  5.48*  --    Estimated Creatinine Clearance: 6.8 mL/min (A) (by C-G formula based on SCr of 5.48 mg/dL (H)).  Medical History: Past Medical History:  Diagnosis Date  . Anemia 02/11/2012  . Anxiety   . Aortic stenosis    mild AS 10/2016 echo  . Arthritis    arthritis in neck  . Atrial fibrillation ([facility])   . CHF (congestive heart failure) ([facility])   . Chronic kidney disease    hemodialysis Tu/Th/Sa  . Dyspnea    on exertion  . GERD (gastroesophageal reflux disease)   . GI bleed 01/2017   due to gastric ulcer, taking NSAIDs while on coumadin  . H/O metabolic acidosis   . Headache   . Hyperlipidemia   . Hyperparathyroidism, secondary ([facility])   . Hypertension    Dr. Regino Schultze, [place], [place]  . Peptic ulcer   . Peripheral vascular disease ([facility])   . Vitamin D deficiency    Medications:  Medications Prior to Admission  Medication Sig Dispense Refill Last Dose  . ALPRAZolam (XANAX) 0.5 MG tablet Take 0.5 mg by mouth 3 (three) times daily as needed for anxiety.   11/22/2017 at Unknown time  . diltiazem (CARDIZEM) 60 MG tablet Take 60 mg by mouth daily.   11/23/2017 at Unknown time  . doxycycline (VIBRA-TABS) 100 MG tablet Take 1 tablet by mouth 2 (two) times  daily.   11/24/2017 at Unknown time  . HYDROcodone-acetaminophen (NORCO) 10-325 MG tablet Take 1 tablet by mouth 4 (four) times daily as needed for moderate pain.    11/24/2017 at Unknown time  . hydrOXYzine (ATARAX/VISTARIL) 25 MG tablet Take 1 tablet by mouth 4 (four) times daily as needed for itching.   11/24/2017 at Unknown time  . metoprolol tartrate (LOPRESSOR) 25 MG tablet Take 25 mg by mouth 2 (two) times daily.   11/24/2017 at 0730  . multivitamin (RENA-VIT) TABS tablet Take 1 tablet by mouth daily.   11/23/2017 at Unknown time  . promethazine (PHENERGAN) 25 MG tablet Take 25 mg by mouth every 6 (six) hours as needed for nausea or vomiting.   unknown  . sevelamer (RENAGEL) 800 MG tablet Take 1,600 mg by mouth 3 (three) times daily with meals.   11/23/2017 at Unknown time    Assessment: Per EMS, pt received dialysis yesterday and reports leg pain ever since. Pt reports history of same with swelling and reports today pain is more intense. Pt reports left foot feels numb since waking this am. Pt  not on anticoagulants PTA.  Heparin level is above goal.   Goal of Therapy:  Heparin level 0.3-0.7 units/ml Monitor platelets by anticoagulation protocol: Yes   Plan:   Decrease Heparin infusion to 800 units/hr  Heparin level in 6-8 hrs then daily  CBC daily while on Heparin  Judeth Cornfield, PharmD Clinical Pharmacist 11/25/2017 3:21 PM

## 2017-11-25 NOTE — Progress Notes (Signed)
PROGRESS NOTE                                                                                                                                                                                                             Patient Demographics:    Patricia Ayala, is a 84 y.o. female, DOB - 12/06/1928, ZOX:[id number]  Admit date - 11/24/2017   Admitting Physician Courage Mariea Clonts, MD  Outpatient Primary MD for the patient is [name], MD  LOS - 1  Outpatient Specialists: Dr. Imogene Burn (vascular surgery)  Chief Complaint  Patient presents with  . Leg Pain       Brief Narrative   84 year old female with ESRD on hemodialysis (T, T, S), A. fib and prior history of DVT off Coumadin since April 2018 due to life-threatening GI bleed from gastric ulcers while on anticoagulation and also taking NSAIDs (she required multiple transfusions and was also placed on ventilator transiently) , anemia of chronic kidney disease who presented with bilateral lower extremity swelling with pain for almost 2 months duration worsened in the past several days. In the ED she was found recently DVT of both lower extremities extending from common femoral vein involving the femoral veins, popliteal and calf veins. ED physician discussed with on-call vascular surgeon Dr. Darrick Penna who recommended no intervention at this time and monitor her on anticoagulation.   Subjective:   Patient complains of off and on pain in her bilateral legs. Denies any chest pain or shortness of breath.   Assessment  & Plan :    Principal Problem:   Extensive DVT of lower extremity, bilateral (HCC) Started on IV heparin drip. H&H currently stable. I will discuss with GI tomorrow if patient needs to have EGD to evaluate for heel ulcers in order to continue long-term anticoagulation. If H&H stable, I will start her on Coumadin.  Active Problems:   ESRD (end stage renal disease)  (HCC) On hemodialysis Tuesdays, Thursdays and Saturdays. Renal following. Will get dialysis tomorrow.     Atrial fibrillation with RVR (HCC) Currently rate controlled. Continue Cardizem and metoprolol. On IV heparin drip. Start Coumadin tomorrow after discussing with GI and if H&H stable.    H/o Chronic gastric ulcer with hemorrhage 01/2017 H&H currently stable. Discussed with GI in a.m if patient needs EGD to evaluate her ulcers. Continue PPI  Anemia  of chronic disease Stable.  Left basic vein transposition with imminent thrombosis  patient had left radial artery cannulation and shuntogram done on 1/21. Showed occlusion of the fistula 2-3 cm distal to the anastomosis with a subtotal occlusion of the anastomosis. Recommends new permanent access to be placed in left arm. Given her acute DVT and need for anticoagulation I think this will have to be postponed for at least 3 months while on active anticoagulation. Continue dialysis with permacath. I will discuss with Dr. Imogene Burn tomorrow.   Goals of care Patient currently full code. Discussed with husband at bedside who agrees with goals of care discussion with palliative care. Husband and daughters involved in care.    Code Status : Full code  Family Communication  : Husband at bedside  Disposition Plan  : Home once INR therapeutic on Coumadin  Barriers For Discharge : Active symptoms  Consults  :  Nephrology  Procedures  : Doppler lower extremity  DVT Prophylaxis  :  IV heparin  Lab Results  Component Value Date   PLT 257 11/25/2017    Antibiotics  :    Anti-infectives (From admission, onward)   None        Objective:   Vitals:   11/24/17 2045 11/25/17 0511 11/25/17 1057 11/25/17 1409  BP: 112/66 123/90 (!) 130/93 125/76  Pulse: 86 78 99 84  Resp: 18 17  19   Temp: 99.3 F (37.4 C) 98.6 F (37 C)  98.3 F (36.8 C)  TempSrc: Oral Oral  Oral  SpO2: 97% 99%  96%  Weight: 67.6 kg (149 lb)     Height: 5\' 4"  (1.626 m)        Wt Readings from Last 3 Encounters:  11/24/17 67.6 kg (149 lb)  11/18/17 68 kg (150 lb)  11/08/17 68 kg (150 lb)     Intake/Output Summary (Last 24 hours) at 11/25/2017 1746 Last data filed at 11/25/2017 1700 Gross per 24 hour  Intake 1342.35 ml  Output 1200 ml  Net 142.35 ml     Physical Exam  Gen: not in distress hard of hearing HEENT: no pallor, moist mucosa, supple neck Chest: clear b/l, no added sounds CVS: S1 and S2 irregular, no murmurs rub or gallop GI: soft, NT, ND, BS+ Musculoskeletal: warm, bilateral leg swellings with some tenderness on pressure     Data Review:    CBC Recent Labs  Lab 11/24/17 1053 11/25/17 0547  WBC 10.1 8.0  HGB 12.1 11.4*  HCT 40.3 37.9  PLT 238 257  MCV 97.6 95.9  MCH 29.3 28.9  MCHC 30.0 30.1  RDW 14.5 14.3  LYMPHSABS 0.9  --   MONOABS 0.8  --   EOSABS 0.1  --   BASOSABS 0.0  --     Chemistries  Recent Labs  Lab 11/24/17 1053 11/25/17 0547  NA 139 140  K 3.6 3.5  CL 99* 102  CO2 25 24  GLUCOSE 120* 82  BUN 20 27*  CREATININE 4.63* 5.48*  CALCIUM 10.2 9.5  AST 16  --   ALT 13*  --   ALKPHOS 84  --   BILITOT 0.7  --    ------------------------------------------------------------------------------------------------------------------ No results for input(s): CHOL, HDL, LDLCALC, TRIG, CHOLHDL, LDLDIRECT in the last 72 hours.  No results found for: HGBA1C ------------------------------------------------------------------------------------------------------------------ No results for input(s): TSH, T4TOTAL, T3FREE, THYROIDAB in the last 72 hours.  Invalid input(s): FREET3 ------------------------------------------------------------------------------------------------------------------ No results for input(s): VITAMINB12, FOLATE, FERRITIN, TIBC, IRON, RETICCTPCT in the last 72  hours.  Coagulation profile Recent Labs  Lab 11/24/17 1053  INR 0.97    No results for input(s): DDIMER in the last 72  hours.  Cardiac Enzymes No results for input(s): CKMB, TROPONINI, MYOGLOBIN in the last 168 hours.  Invalid input(s): CK ------------------------------------------------------------------------------------------------------------------    Component Value Date/Time   BNP 1,962.0 (H) 11/01/2016 5621    Inpatient Medications  Scheduled Meds: . diltiazem  30 mg Oral TID  . metoprolol tartrate  25 mg Oral BID  . multivitamin  1 tablet Oral Daily  . pantoprazole  40 mg Oral Daily  . senna  1 tablet Oral BID  . sevelamer carbonate  800 mg Oral TID WC  . sodium chloride flush  3 mL Intravenous Q12H   Continuous Infusions: . sodium chloride    . heparin 800 Units/hr (11/25/17 1549)   PRN Meds:.sodium chloride, acetaminophen **OR** acetaminophen, albuterol, ALPRAZolam, HYDROcodone-acetaminophen, hydrOXYzine, morphine injection, ondansetron **OR** ondansetron (ZOFRAN) IV, polyethylene glycol, promethazine, sodium chloride flush, traZODone  Micro Results No results found for this or any previous visit (from the past 240 hour(s)).  Radiology Reports Dg Ankle Complete Left  Result Date: 11/24/2017 CLINICAL DATA:  Pain and swelling for 3 months EXAM: LEFT ANKLE COMPLETE - 3+ VIEW COMPARISON:  None FINDINGS: Diffuse soft tissue swelling. Osseous demineralization. Joint spaces preserved. No acute fracture, dislocation, or bone destruction. Scattered soft tissue calcifications at lower leg including phleboliths and vascular calcifications extending into foot. IMPRESSION: Significant soft tissue swelling and osseous demineralization without acute bony abnormalities. Electronically Signed   By: Ulyses Southward M.D.   On: 11/24/2017 13:19   Dg Ankle Complete Right  Result Date: 11/24/2017 CLINICAL DATA:  Pain and swelling for 3 months EXAM: RIGHT ANKLE - COMPLETE 3+ VIEW COMPARISON:  None FINDINGS: Marked soft tissue swelling. Diffuse osseous demineralization. Joint spaces preserved. No acute fracture,  dislocation, or bone destruction. Small vessel vascular calcifications extending into foot. IMPRESSION: Significant soft tissue swelling and osseous demineralization without acute bony abnormality. Electronically Signed   By: Ulyses Southward M.D.   On: 11/24/2017 13:19   US Venous Img Lower Bilateral  Result Date: 11/24/2017 CLINICAL DATA:  Pain and swelling in both lower extremities for 3 months EXAM: BILATERAL LOWER EXTREMITY VENOUS DOPPLER ULTRASOUND TECHNIQUE: Gray-scale sonography with graded compression, as well as color Doppler and duplex ultrasound were performed to evaluate the lower extremity deep venous systems from the level of the common femoral vein and including the common femoral, femoral, profunda femoral, popliteal and calf veins including the posterior tibial, peroneal and gastrocnemius veins when visible. The superficial great saphenous vein was also interrogated. Spectral Doppler was utilized to evaluate flow at rest and with distal augmentation maneuvers in the common femoral, femoral and popliteal veins. COMPARISON:  11/06/2016 FINDINGS: RIGHT LOWER EXTREMITY Common Femoral Vein: Nonocclusive hypoechoic thrombus with impaired compressibility and diminished flow Saphenofemoral Junction: No evidence of thrombus. Normal compressibility and flow on color Doppler imaging. Profunda Femoral Vein: No evidence of thrombus. Normal compressibility and flow on color Doppler imaging. Femoral Vein: Nonocclusive hypoechoic thrombus with impaired compressibility and diminished spontaneous venous flow Popliteal Vein: Nonocclusive hypoechoic thrombus with impaired compressibility and diminished spontaneous venous flow Calf Veins: Occlusive thrombus of the perineal and posterior tibial veins Superficial Great Saphenous Vein: No evidence of thrombus. Normal compressibility. Venous Reflux:  None. Other Findings: Baker cyst RIGHT popliteal fossa 5.2 x 1.9 x 3.8 cm. Scattered subcutaneous edema. LEFT LOWER EXTREMITY  Common Femoral Vein: Occlusive hypoechoic thrombus with impaired compressibility and  absent spontaneous venous flow Saphenofemoral Junction: Thrombus at the saphenofemoral junction. Profunda Femoral Vein: No evidence of thrombus. Normal compressibility and flow on color Doppler imaging. Femoral Vein: Non occlusive hypoechoic thrombus proximally with occlusive thrombus distally Popliteal Vein: No evidence of thrombus. Normal compressibility, respiratory phasicity and response to augmentation. Calf Veins: No evidence of thrombus. Normal compressibility and flow on color Doppler imaging. Superficial Great Saphenous Vein: No evidence of thrombus in greater saphenous vein. Hypoechoic thrombus seen within lesser saphenous vein with impaired compressibility. Venous Reflux:  None. Other Findings:  Scattered subcutaneous edema IMPRESSION: Positive exam for presence of acute appearing deep venous thrombosis in the lower extremities bilaterally involving BILATERAL common femoral veins, BILATERAL femoral veins, and RIGHT popliteal vein into RIGHT calf veins. Baker cyst RIGHT popliteal fossa. Electronically Signed   By: Ulyses Southward M.D.   On: 11/24/2017 13:12   Dg Foot Complete Left  Result Date: 11/24/2017 CLINICAL DATA:  Pain and swelling for 3 months EXAM: LEFT FOOT - COMPLETE 3+ VIEW COMPARISON:  None. FINDINGS: Significant diffuse soft tissue swelling. Osseous demineralization. Joint spaces preserved. Small vessel vascular calcifications. Calcaneal spurring. No acute fracture, dislocation or bone destruction. IMPRESSION: Significant soft tissue swelling and osseous demineralization without acute bony abnormalities. Calcaneal spurring. Electronically Signed   By: Ulyses Southward M.D.   On: 11/24/2017 13:17   Dg Foot Complete Right  Result Date: 11/24/2017 CLINICAL DATA:  Pain and swelling for 3 months EXAM: RIGHT FOOT COMPLETE - 3+ VIEW COMPARISON:  None FINDINGS: Significant diffuse soft tissue edema. Osseous  demineralization. Joint spaces preserved. No acute fracture, dislocation, or bone destruction. Small plantar calcaneal spur. Small vessel vascular calcifications. IMPRESSION: Osseous demineralization and significant soft tissue swelling without acute bony abnormalities. Calcaneal spur. Electronically Signed   By: Ulyses Southward M.D.   On: 11/24/2017 13:15    Time Spent in minutes  25   Neima Lacross M.D on 11/25/2017 at 5:46 PM  Between 7am to 7pm - Pager - [phone]  After 7pm go to [email or web address] - password [facility]  [facility] -  Office  [phone]

## 2017-11-26 ENCOUNTER — Encounter (HOSPITAL_COMMUNITY): Payer: Self-pay | Admitting: Gastroenterology

## 2017-11-26 ENCOUNTER — Encounter (HOSPITAL_COMMUNITY)
Admission: EM | Disposition: A | Discharge status: Home / Self Care | Payer: Self-pay | Source: Home / Self Care | Attending: Internal Medicine

## 2017-11-26 ENCOUNTER — Inpatient Hospital Stay (HOSPITAL_COMMUNITY): Payer: Medicare Other

## 2017-11-26 DIAGNOSIS — Z7189 Other specified counseling: Secondary | ICD-10-CM

## 2017-11-26 DIAGNOSIS — I10 Essential (primary) hypertension: Secondary | ICD-10-CM

## 2017-11-26 DIAGNOSIS — I4891 Unspecified atrial fibrillation: Secondary | ICD-10-CM

## 2017-11-26 DIAGNOSIS — I82403 Acute embolism and thrombosis of unspecified deep veins of lower extremity, bilateral: Secondary | ICD-10-CM

## 2017-11-26 DIAGNOSIS — Z515 Encounter for palliative care: Secondary | ICD-10-CM

## 2017-11-26 HISTORY — PX: ESOPHAGOGASTRODUODENOSCOPY: SHX5428

## 2017-11-26 HISTORY — PX: BIOPSY: SHX5522

## 2017-11-26 LAB — RENAL FUNCTION PANEL
ALBUMIN: 2.5 g/dL — AB (ref 3.5–5.0)
Anion gap: 14 (ref 5–15)
BUN: 34 mg/dL — AB (ref 6–20)
CO2: 24 mmol/L (ref 22–32)
Calcium: 9.7 mg/dL (ref 8.9–10.3)
Chloride: 102 mmol/L (ref 101–111)
Creatinine, Ser: 6.88 mg/dL — ABNORMAL HIGH (ref 0.44–1.00)
GFR calc Af Amer: 6 mL/min — ABNORMAL LOW (ref >60)
GFR calc non Af Amer: 5 mL/min — ABNORMAL LOW (ref >60)
GLUCOSE: 99 mg/dL (ref 65–99)
PHOSPHORUS: 5.1 mg/dL — AB (ref 2.5–4.6)
Potassium: 3.6 mmol/L (ref 3.5–5.1)
Sodium: 140 mmol/L (ref 135–145)

## 2017-11-26 LAB — CBC
HCT: 37.3 % (ref 36.0–46.0)
Hemoglobin: 11.5 g/dL — ABNORMAL LOW (ref 12.0–15.0)
MCH: 29.2 pg (ref 26.0–34.0)
MCHC: 30.8 g/dL (ref 30.0–36.0)
MCV: 94.7 fL (ref 78.0–100.0)
PLATELETS: 297 10*3/uL (ref 150–400)
RBC: 3.94 MIL/uL (ref 3.87–5.11)
RDW: 14.2 % (ref 11.5–15.5)
WBC: 8.9 10*3/uL (ref 4.0–10.5)

## 2017-11-26 LAB — HEPARIN LEVEL (UNFRACTIONATED)
HEPARIN UNFRACTIONATED: 0.18 [IU]/mL — AB (ref 0.30–0.70)
HEPARIN UNFRACTIONATED: 0.91 [IU]/mL — AB (ref 0.30–0.70)
Heparin Unfractionated: 0.32 IU/mL (ref 0.30–0.70)

## 2017-11-26 SURGERY — EGD (ESOPHAGOGASTRODUODENOSCOPY)
Anesthesia: Moderate Sedation

## 2017-11-26 MED ORDER — STERILE WATER FOR IRRIGATION IR SOLN
Status: DC | PRN
  Administered 2017-11-26: 15:00:00

## 2017-11-26 MED ORDER — SODIUM CHLORIDE 0.9 % IV SOLN: INTRAVENOUS | Status: DC

## 2017-11-26 MED ORDER — ALTEPLASE 2 MG IJ SOLR
2.0000 mg | Freq: Once | INTRAMUSCULAR | Status: DC | PRN
  Filled 2017-11-26: qty 2

## 2017-11-26 MED ORDER — LIDOCAINE VISCOUS 2 % MT SOLN
OROMUCOSAL | Status: AC
  Filled 2017-11-26: qty 15

## 2017-11-26 MED ORDER — FENTANYL CITRATE (PF) 100 MCG/2ML IJ SOLN
INTRAMUSCULAR | Status: DC | PRN
  Administered 2017-11-26 (×3): 25 ug via INTRAVENOUS

## 2017-11-26 MED ORDER — SODIUM CHLORIDE 0.9 % IV SOLN: 100.0000 mL | INTRAVENOUS | Status: DC | PRN

## 2017-11-26 MED ORDER — PROMETHAZINE HCL 12.5 MG PO TABS: 12.5000 mg | ORAL_TABLET | Freq: Four times a day (QID) | ORAL | Status: DC | PRN

## 2017-11-26 MED ORDER — METOPROLOL TARTRATE 5 MG/5ML IV SOLN
INTRAVENOUS | Status: DC | PRN
  Administered 2017-11-26: 2.5 mg via INTRAVENOUS

## 2017-11-26 MED ORDER — LIDOCAINE VISCOUS 2 % MT SOLN
OROMUCOSAL | Status: DC | PRN
  Administered 2017-11-26: 4 mL via OROMUCOSAL

## 2017-11-26 MED ORDER — WARFARIN - PHARMACIST DOSING INPATIENT
Freq: Every day | Status: DC
  Administered 2017-11-27 – 2017-12-03 (×5)

## 2017-11-26 MED ORDER — DILTIAZEM HCL 30 MG PO TABS
30.0000 mg | ORAL_TABLET | ORAL | Status: AC
  Administered 2017-11-26: 30 mg via ORAL

## 2017-11-26 MED ORDER — MIDAZOLAM HCL 5 MG/5ML IJ SOLN
INTRAMUSCULAR | Status: AC
  Filled 2017-11-26: qty 5

## 2017-11-26 MED ORDER — HEPARIN SODIUM (PORCINE) 1000 UNIT/ML DIALYSIS
1000.0000 [IU] | INTRAMUSCULAR | Status: DC | PRN
  Administered 2017-11-28 – 2017-12-03 (×3): 3400 [IU] via INTRAVENOUS_CENTRAL
  Filled 2017-11-26 (×4): qty 1

## 2017-11-26 MED ORDER — MIDAZOLAM HCL 5 MG/5ML IJ SOLN
INTRAMUSCULAR | Status: DC | PRN
  Administered 2017-11-26: 1 mg via INTRAVENOUS
  Administered 2017-11-26: 2 mg via INTRAVENOUS
  Administered 2017-11-26 (×2): 1 mg via INTRAVENOUS

## 2017-11-26 MED ORDER — HEPARIN BOLUS VIA INFUSION
2000.0000 [IU] | Freq: Once | INTRAVENOUS | Status: AC
  Administered 2017-11-26: 2000 [IU] via INTRAVENOUS
  Filled 2017-11-26: qty 2000

## 2017-11-26 MED ORDER — WARFARIN SODIUM 5 MG PO TABS
5.0000 mg | ORAL_TABLET | Freq: Once | ORAL | Status: AC
  Administered 2017-11-26: 5 mg via ORAL
  Filled 2017-11-26: qty 1

## 2017-11-26 MED ORDER — FENTANYL CITRATE (PF) 100 MCG/2ML IJ SOLN
INTRAMUSCULAR | Status: AC
  Filled 2017-11-26: qty 2

## 2017-11-26 MED ORDER — METOPROLOL TARTRATE 5 MG/5ML IV SOLN
2.5000 mg | Freq: Once | INTRAVENOUS | Status: AC
  Administered 2017-11-26: 2.5 mg via INTRAVENOUS

## 2017-11-26 MED ORDER — METOPROLOL TARTRATE 5 MG/5ML IV SOLN
INTRAVENOUS | Status: AC
  Filled 2017-11-26: qty 5

## 2017-11-26 NOTE — Progress Notes (Addendum)
PROGRESS NOTE                                                                                                                                                                                                             Patient Demographics:    Patricia Ayala, is a 84 y.o. female, DOB - Aug 18, 1929, AES:[id number]  Admit date - 11/24/2017   Admitting Physician Courage Denton Brick, MD  Outpatient Primary MD for the patient is Redmond School, MD  LOS - 2  Outpatient Specialists: Dr. Bridgett Larsson (vascular surgery)  Chief Complaint  Patient presents with  . Leg Pain       Brief Narrative   84 year old female with ESRD on hemodialysis (T, T, S), A. fib and prior history of DVT off Coumadin since April 2018 due to life-threatening GI bleed from gastric ulcers while on anticoagulation and also taking NSAIDs (she required multiple transfusions and was also placed on ventilator transiently) , anemia of chronic kidney disease who presented with bilateral lower extremity swelling with pain for almost 2 months duration worsened in the past several days. In the ED she was found recently DVT of both lower extremities extending from common femoral vein involving the femoral veins, popliteal and calf veins. ED physician discussed with on-call vascular surgeon Dr. Oneida Alar who recommended no intervention at this time and monitor her on anticoagulation.   Subjective:   Leg pain much better today. Became tachycardic to 140s after EGD this afternoon   Assessment  & Plan :    Principal Problem:   Extensive DVT of lower extremity, bilateral (HCC) Started on IV heparin drip.   EGD done today negative fr ulcers and shows gastritis, biopsy taken.  H&H stable, I will start her on Coumadin.  Active Problems:   ESRD (end stage renal disease) ([facility]) On hemodialysis Tuesdays, Thursdays and Saturdays. Renal following. Received HD today.     Atrial fibrillation  with RVR (HCC) HR elevated post EGD. Received 2 doses of IV lopressor, currently better.Continue Cardizem and metoprolol. Started coumadin today.    H/o Chronic gastric ulcer with hemorrhage 01/2017 EGD with gastritis, no ulcer. Started coumadin  Anemia of chronic disease Stable.  Left basic vein transposition with imminent thrombosis  patient had left radial artery cannulation and shuntogram done on 1/21. Showed occlusion of the fistula 2-3 cm distal to the anastomosis  with a subtotal occlusion of the anastomosis. Recommends new permanent access to be placed in left arm. Given her acute DVT and need for anticoagulation I think this will have to be postponed for at least 3 months while on active anticoagulation. Continue dialysis with permacath. I will discuss with Dr. Bridgett Larsson this  week.   Goals of care Patient currently full code. Discussed with husband at bedside who agrees with goals of care discussion with palliative care. Husband and daughters involved in care. Pall care met with patient today who expressed full scope of treatment. They will schedule family meeting this week.     Code Status : Full code  Family Communication  : Husband at bedside  Disposition Plan  : Home once INR therapeutic on Coumadin  Barriers For Discharge : Active symptoms  Consults  :   Nephrology GI  Procedures  :  Doppler lower extremity EGD  DVT Prophylaxis  :  IV heparin/ couamdin  Lab Results  Component Value Date   PLT 297 11/26/2017    Antibiotics  :    Anti-infectives (From admission, onward)   None        Objective:   Vitals:   11/26/17 1550 11/26/17 1555 11/26/17 1600 11/26/17 1605  BP: (!) 129/100 118/69 102/65   Pulse: (!) 115 (!) 43 (!) 136 (!) 53  Resp: '13 17 16 13  ' Temp:      TempSrc:      SpO2: 100% 100% 100% 100%  Weight:      Height:        Wt Readings from Last 3 Encounters:  11/26/17 65.7 kg (144 lb 13.5 oz)  11/18/17 68 kg (150 lb)  11/08/17 68 kg (150 lb)      Intake/Output Summary (Last 24 hours) at 11/26/2017 1702 Last data filed at 11/26/2017 1602 Gross per 24 hour  Intake 482.47 ml  Output 2603 ml  Net -2120.53 ml     Physical Exam Gen: not in distress HEENT: moist mucosa, supple neck Chest: clear b/l, rt sided perm cath  CVS: S1&S2 irregular, no murmurs GI: soft, ND, NT Musculoskeletal: warm, b/l leg swelling     Data Review:    CBC Recent Labs  Lab 11/24/17 1053 11/25/17 0547 11/26/17 0537  WBC 10.1 8.0 8.9  HGB 12.1 11.4* 11.5*  HCT 40.3 37.9 37.3  PLT 238 257 297  MCV 97.6 95.9 94.7  MCH 29.3 28.9 29.2  MCHC 30.0 30.1 30.8  RDW 14.5 14.3 14.2  LYMPHSABS 0.9  --   --   MONOABS 0.8  --   --   EOSABS 0.1  --   --   BASOSABS 0.0  --   --     Chemistries  Recent Labs  Lab 11/24/17 1053 11/25/17 0547 11/26/17 0537  NA 139 140 140  K 3.6 3.5 3.6  CL 99* 102 102  CO2 '25 24 24  ' GLUCOSE 120* 82 99  BUN 20 27* 34*  CREATININE 4.63* 5.48* 6.88*  CALCIUM 10.2 9.5 9.7  AST 16  --   --   ALT 13*  --   --   ALKPHOS 84  --   --   BILITOT 0.7  --   --    ------------------------------------------------------------------------------------------------------------------ No results for input(s): CHOL, HDL, LDLCALC, TRIG, CHOLHDL, LDLDIRECT in the last 72 hours.  No results found for: HGBA1C ------------------------------------------------------------------------------------------------------------------ No results for input(s): TSH, T4TOTAL, T3FREE, THYROIDAB in the last 72 hours.  Invalid input(s): FREET3 ------------------------------------------------------------------------------------------------------------------ No results  for input(s): VITAMINB12, FOLATE, FERRITIN, TIBC, IRON, RETICCTPCT in the last 72 hours.  Coagulation profile Recent Labs  Lab 11/24/17 1053  INR 0.97    No results for input(s): DDIMER in the last 72 hours.  Cardiac Enzymes No results for input(s): CKMB, TROPONINI,  MYOGLOBIN in the last 168 hours.  Invalid input(s): CK ------------------------------------------------------------------------------------------------------------------    Component Value Date/Time   BNP 1,962.0 (H) 11/01/2016 9937    Inpatient Medications  Scheduled Meds: . diltiazem  30 mg Oral TID  . fentaNYL      . lidocaine      . metoprolol tartrate      . metoprolol tartrate  25 mg Oral BID  . midazolam      . multivitamin  1 tablet Oral Daily  . pantoprazole  40 mg Oral Daily  . senna  1 tablet Oral BID  . sevelamer carbonate  800 mg Oral TID WC  . sodium chloride flush  3 mL Intravenous Q12H   Continuous Infusions: . sodium chloride    . sodium chloride    . sodium chloride    . heparin 950 Units/hr (11/26/17 1148)   PRN Meds:.sodium chloride, sodium chloride, sodium chloride, acetaminophen **OR** acetaminophen, albuterol, ALPRAZolam, alteplase, heparin, HYDROcodone-acetaminophen, hydrOXYzine, morphine injection, ondansetron **OR** ondansetron (ZOFRAN) IV, polyethylene glycol, promethazine, sodium chloride flush, traZODone  Micro Results No results found for this or any previous visit (from the past 240 hour(s)).  Radiology Reports Dg Ankle Complete Left  Result Date: 11/24/2017 CLINICAL DATA:  Pain and swelling for 3 months EXAM: LEFT ANKLE COMPLETE - 3+ VIEW COMPARISON:  None FINDINGS: Diffuse soft tissue swelling. Osseous demineralization. Joint spaces preserved. No acute fracture, dislocation, or bone destruction. Scattered soft tissue calcifications at lower leg including phleboliths and vascular calcifications extending into foot. IMPRESSION: Significant soft tissue swelling and osseous demineralization without acute bony abnormalities. Electronically Signed   By: Lavonia Dana M.D.   On: 11/24/2017 13:19   Dg Ankle Complete Right  Result Date: 11/24/2017 CLINICAL DATA:  Pain and swelling for 3 months EXAM: RIGHT ANKLE - COMPLETE 3+ VIEW COMPARISON:  None  FINDINGS: Marked soft tissue swelling. Diffuse osseous demineralization. Joint spaces preserved. No acute fracture, dislocation, or bone destruction. Small vessel vascular calcifications extending into foot. IMPRESSION: Significant soft tissue swelling and osseous demineralization without acute bony abnormality. Electronically Signed   By: Lavonia Dana M.D.   On: 11/24/2017 13:19   US Venous Img Lower Bilateral  Result Date: 11/24/2017 CLINICAL DATA:  Pain and swelling in both lower extremities for 3 months EXAM: BILATERAL LOWER EXTREMITY VENOUS DOPPLER ULTRASOUND TECHNIQUE: Gray-scale sonography with graded compression, as well as color Doppler and duplex ultrasound were performed to evaluate the lower extremity deep venous systems from the level of the common femoral vein and including the common femoral, femoral, profunda femoral, popliteal and calf veins including the posterior tibial, peroneal and gastrocnemius veins when visible. The superficial great saphenous vein was also interrogated. Spectral Doppler was utilized to evaluate flow at rest and with distal augmentation maneuvers in the common femoral, femoral and popliteal veins. COMPARISON:  11/06/2016 FINDINGS: RIGHT LOWER EXTREMITY Common Femoral Vein: Nonocclusive hypoechoic thrombus with impaired compressibility and diminished flow Saphenofemoral Junction: No evidence of thrombus. Normal compressibility and flow on color Doppler imaging. Profunda Femoral Vein: No evidence of thrombus. Normal compressibility and flow on color Doppler imaging. Femoral Vein: Nonocclusive hypoechoic thrombus with impaired compressibility and diminished spontaneous venous flow Popliteal Vein: Nonocclusive hypoechoic thrombus with impaired compressibility and  diminished spontaneous venous flow Calf Veins: Occlusive thrombus of the perineal and posterior tibial veins Superficial Great Saphenous Vein: No evidence of thrombus. Normal compressibility. Venous Reflux:  None.  Other Findings: Baker cyst RIGHT popliteal fossa 5.2 x 1.9 x 3.8 cm. Scattered subcutaneous edema. LEFT LOWER EXTREMITY Common Femoral Vein: Occlusive hypoechoic thrombus with impaired compressibility and absent spontaneous venous flow Saphenofemoral Junction: Thrombus at the saphenofemoral junction. Profunda Femoral Vein: No evidence of thrombus. Normal compressibility and flow on color Doppler imaging. Femoral Vein: Non occlusive hypoechoic thrombus proximally with occlusive thrombus distally Popliteal Vein: No evidence of thrombus. Normal compressibility, respiratory phasicity and response to augmentation. Calf Veins: No evidence of thrombus. Normal compressibility and flow on color Doppler imaging. Superficial Great Saphenous Vein: No evidence of thrombus in greater saphenous vein. Hypoechoic thrombus seen within lesser saphenous vein with impaired compressibility. Venous Reflux:  None. Other Findings:  Scattered subcutaneous edema IMPRESSION: Positive exam for presence of acute appearing deep venous thrombosis in the lower extremities bilaterally involving BILATERAL common femoral veins, BILATERAL femoral veins, and RIGHT popliteal vein into RIGHT calf veins. Baker cyst RIGHT popliteal fossa. Electronically Signed   By: Lavonia Dana M.D.   On: 11/24/2017 13:12   Dg Foot Complete Left  Result Date: 11/24/2017 CLINICAL DATA:  Pain and swelling for 3 months EXAM: LEFT FOOT - COMPLETE 3+ VIEW COMPARISON:  None. FINDINGS: Significant diffuse soft tissue swelling. Osseous demineralization. Joint spaces preserved. Small vessel vascular calcifications. Calcaneal spurring. No acute fracture, dislocation or bone destruction. IMPRESSION: Significant soft tissue swelling and osseous demineralization without acute bony abnormalities. Calcaneal spurring. Electronically Signed   By: Lavonia Dana M.D.   On: 11/24/2017 13:17   Dg Foot Complete Right  Result Date: 11/24/2017 CLINICAL DATA:  Pain and swelling for 3 months  EXAM: RIGHT FOOT COMPLETE - 3+ VIEW COMPARISON:  None FINDINGS: Significant diffuse soft tissue edema. Osseous demineralization. Joint spaces preserved. No acute fracture, dislocation, or bone destruction. Small plantar calcaneal spur. Small vessel vascular calcifications. IMPRESSION: Osseous demineralization and significant soft tissue swelling without acute bony abnormalities. Calcaneal spur. Electronically Signed   By: Lavonia Dana M.D.   On: 11/24/2017 13:15    Time Spent in minutes  25   Breah Joa M.D on 11/26/2017 at 5:02 PM  Between 7am to 7pm - Pager - [phone]  After 7pm go to [email or web address] - password [facility]  [facility] -  Office  [phone]

## 2017-11-26 NOTE — Procedures (Signed)
     HEMODIALYSIS TREATMENT NOTE:   4 hour heparin-free dialysis completed via right IJ tunneled catheter. Exit site unremarkable. Goal met: 2 liters removed without interruption in ultafiltration.  Hemodynamically stable throughout HD session.  All blood was returned.  Report given to Threasa Alpha, RN.  Rockwell Alexandria, RN, CDN

## 2017-11-26 NOTE — Progress Notes (Signed)
ANTICOAGULATION CONSULT NOTE - follow up  Pharmacy Consult for HEPARIN Indication: VTE treatment  Allergies  Allergen Reactions  . Coumadin [Warfarin Sodium]     Caused GIB   Patient Measurements: Height: 5\' 4"  (162.6 cm) Weight: 149 lb (67.6 kg) IBW/kg (Calculated) : 54.7 HEPARIN DW (KG): 67.6  Vital Signs: Temp: 98.6 F (37 C) (01/29 0557) Temp Source: Oral (01/29 0557) BP: 144/89 (01/29 0557) Pulse Rate: 69 (01/29 0557)  Labs: Recent Labs    11/24/17 1053  11/25/17 0547 11/25/17 1403 11/25/17 2324 11/26/17 0532 11/26/17 0537  HGB 12.1  --  11.4*  --   --   --  11.5*  HCT 40.3  --  37.9  --   --   --  37.3  PLT 238  --  257  --   --   --  297  APTT 34  --   --   --   --   --   --   LABPROT 12.8  --   --   --   --   --   --   INR 0.97  --   --   --   --   --   --   HEPARINUNFRC  --    < > 1.02* 0.95* 0.32 0.18*  --   CREATININE 4.63*  --  5.48*  --   --   --  6.88*   < > = values in this interval not displayed.   Estimated Creatinine Clearance: 5.4 mL/min (A) (by C-G formula based on SCr of 6.88 mg/dL (H)).  Medical History: Past Medical History:  Diagnosis Date  . Anemia 02/11/2012  . Anxiety   . Aortic stenosis    mild AS 10/2016 echo  . Arthritis    arthritis in neck  . Atrial fibrillation (HCC)   . CHF (congestive heart failure) (HCC)   . Chronic kidney disease    hemodialysis Tu/Th/Sa  . Dyspnea    on exertion  . GERD (gastroesophageal reflux disease)   . GI bleed 01/2017   due to gastric ulcer, taking NSAIDs while on coumadin  . H/O metabolic acidosis   . Headache   . Hyperlipidemia   . Hyperparathyroidism, secondary (HCC)   . Hypertension    Dr. Regino Schultze, [place], [place]  . Peptic ulcer   . Peripheral vascular disease (HCC)   . Vitamin D deficiency    Medications:  Medications Prior to Admission  Medication Sig Dispense Refill Last Dose  . ALPRAZolam (XANAX) 0.5 MG tablet Take 0.5 mg by mouth 3 (three) times daily as needed for anxiety.    11/22/2017 at Unknown time  . diltiazem (CARDIZEM) 60 MG tablet Take 60 mg by mouth daily.   11/23/2017 at Unknown time  . doxycycline (VIBRA-TABS) 100 MG tablet Take 1 tablet by mouth 2 (two) times daily.   11/24/2017 at Unknown time  . HYDROcodone-acetaminophen (NORCO) 10-325 MG tablet Take 1 tablet by mouth 4 (four) times daily as needed for moderate pain.    11/24/2017 at Unknown time  . hydrOXYzine (ATARAX/VISTARIL) 25 MG tablet Take 1 tablet by mouth 4 (four) times daily as needed for itching.   11/24/2017 at Unknown time  . metoprolol tartrate (LOPRESSOR) 25 MG tablet Take 25 mg by mouth 2 (two) times daily.   11/24/2017 at 0730  . multivitamin (RENA-VIT) TABS tablet Take 1 tablet by mouth daily.   11/23/2017 at Unknown time  . promethazine (PHENERGAN) 25 MG tablet Take 25 mg by  mouth every 6 (six) hours as needed for nausea or vomiting.   unknown  . sevelamer (RENAGEL) 800 MG tablet Take 1,600 mg by mouth 3 (three) times daily with meals.   11/23/2017 at Unknown time    Assessment: Per EMS, pt received dialysis yesterday and reports leg pain ever since. Pt reports history of same with swelling and reports today pain is more intense. Pt reports left foot feels numb since waking this am. Pt not on anticoagulants PTA.  Heparin level is below goal this AM.  Goal of Therapy:  Heparin level 0.3-0.7 units/ml Monitor platelets by anticoagulation protocol: Yes   Plan:   Heparin bolus 2000 units then  Increase Heparin infusion to 950 units/hr  Heparin level in 6-8 hrs then daily  CBC daily while on Heparin  Elder Cyphers, BS Loura Back, [place] Clinical Pharmacist Pager [phone] 11/26/2017 7:54 AM

## 2017-11-26 NOTE — Consult Note (Signed)
Consultation Note Date: 11/26/2017   Patient Name: Patricia Ayala  DOB: [birth year]  MRN: [id number]  Age / Sex: 84 y.o., female  PCP: [name], MD Referring Physician: Eddie North, MD  Reason for Consultation: Establishing goals of care and Psychosocial/spiritual support  HPI/Patient Profile: 84 y.o. female  with past medical history of atrial fin no longer on anticoagulation due to history of G.I. Bleed approximately one year ago, in stage renal disease with hemodialysis Tuesday, Thursday, Saturday, anemia of chronic disease, heart failure, GERD, high blood pressure and cholesterol admitted on 11/24/2017 with extensive DVT of lower extremity bilateral.   Clinical Assessment and Goals of Care: Patricia Ayala is resting quietly in bed. She greets me, making and keeping in contact. She is taking HD at this time.  She appears calm and pleasant. There is no family at bedside.   We talk about her home life. She tells me that she is able to mostly manage her household with the help of her 44 year old husband.  She shares that she has relatively good mobility, but she has fallen a few times, using a walker when needed.   I ask if she has ever considered stopping HD, and she tells me that she has not. We talk about what this would look like.   We talk about code status. See below. She agrees to have follow up tomorrow.   Healthcare power of attorney NEXT OF KIN - husband Patricia Ayala as Social research officer, government. She has daughter Patricia Ayala who lives locally, along with daughters Patricia Ayala and Patricia Ayala and a son in [place]   SUMMARY OF RECOMMENDATIONS   At this point, full scope treatment. Continued code status discussions.  Code Status/Advance Care Planning:  Full code - we discussed the concept of treat the treatable but no CPR, no intubation.   Symptom Management:   Per hospitalist, no additional needs  at this time.  Palliative Prophylaxis:   no special needs at this time.  Additional Recommendations (Limitations, Scope, Preferences):  Full Scope Treatment  Psycho-social/Spiritual:   Desire for further Chaplaincy support:no  Additional Recommendations: Caregiving  Support/Resources  Prognosis:   Unable to determine, based on outcomes.  Discharge Planning: her goal is to return to her own home.      Primary Diagnoses: Present on Admission: . Extensive DVT of lower extremity, bilateral (HCC) . ESRD (end stage renal disease) (HCC) . Benign essential HTN . H/o Helicobacter pylori ab+ . Atrial fibrillation with RVR (HCC) . H/o Chronic gastric ulcer with hemorrhage 01/2017   I have reviewed the medical record, interviewed the patient and family, and examined the patient. The following aspects are pertinent.  Past Medical History:  Diagnosis Date  . Anemia 02/11/2012  . Anxiety   . Aortic stenosis    mild AS 10/2016 echo  . Arthritis    arthritis in neck  . Atrial fibrillation (HCC)   . CHF (congestive heart failure) (HCC)   . Chronic kidney disease    hemodialysis Tu/Th/Sa  . Dyspnea  on exertion  . GERD (gastroesophageal reflux disease)   . GI bleed 01/2017   due to gastric ulcer, taking NSAIDs while on coumadin  . H/O metabolic acidosis   . Headache   . Hyperlipidemia   . Hyperparathyroidism, secondary (HCC)   . Hypertension    Dr. Regino Schultze, [place], [place]  . Peptic ulcer   . Peripheral vascular disease (HCC)   . Vitamin D deficiency    Social History   Socioeconomic History  . Marital status: Married    Spouse name: None  . Number of children: None  . Years of education: None  . Highest education level: None  Social Needs  . Financial resource strain: None  . Food insecurity - worry: None  . Food insecurity - inability: None  . Transportation needs - medical: None  . Transportation needs - non-medical: None  Occupational History  . None    Tobacco Use  . Smoking status: Never Smoker  . Smokeless tobacco: Never Used  Substance and Sexual Activity  . Alcohol use: No  . Drug use: No  . Sexual activity: None  Other Topics Concern  . None  Social History Narrative  . None   Family History  Problem Relation Age of Onset  . Stroke Mother   . Cancer Father   . Heart attack Father   . Colon cancer Neg Hx    Scheduled Meds: . diltiazem  30 mg Oral TID  . metoprolol tartrate  25 mg Oral BID  . multivitamin  1 tablet Oral Daily  . pantoprazole  40 mg Oral Daily  . senna  1 tablet Oral BID  . sevelamer carbonate  800 mg Oral TID WC  . sodium chloride flush  3 mL Intravenous Q12H   Continuous Infusions: . sodium chloride    . sodium chloride    . sodium chloride    . heparin 950 Units/hr (11/26/17 1148)   PRN Meds:.sodium chloride, sodium chloride, sodium chloride, acetaminophen **OR** acetaminophen, albuterol, ALPRAZolam, alteplase, heparin, HYDROcodone-acetaminophen, hydrOXYzine, morphine injection, ondansetron **OR** ondansetron (ZOFRAN) IV, polyethylene glycol, promethazine, sodium chloride flush, traZODone Medications Prior to Admission:  Prior to Admission medications   Medication Sig Start Date End Date Taking? Authorizing Provider  ALPRAZolam Prudy Feeler) 0.5 MG tablet Take 0.5 mg by mouth 3 (three) times daily as needed for anxiety.   Yes [provider]  diltiazem (CARDIZEM) 60 MG tablet Take 60 mg by mouth daily.   Yes [provider]  doxycycline (VIBRA-TABS) 100 MG tablet Take 1 tablet by mouth 2 (two) times daily. 11/21/17  Yes [provider]  HYDROcodone-acetaminophen (NORCO) 10-325 MG tablet Take 1 tablet by mouth 4 (four) times daily as needed for moderate pain.    Yes [provider]  hydrOXYzine (ATARAX/VISTARIL) 25 MG tablet Take 1 tablet by mouth 4 (four) times daily as needed for itching. 11/21/17  Yes [provider]  metoprolol tartrate (LOPRESSOR) 25 MG  tablet Take 25 mg by mouth 2 (two) times daily.   Yes [provider]  multivitamin (RENA-VIT) TABS tablet Take 1 tablet by mouth daily.   Yes [provider]  promethazine (PHENERGAN) 25 MG tablet Take 25 mg by mouth every 6 (six) hours as needed for nausea or vomiting.   Yes [provider]  sevelamer (RENAGEL) 800 MG tablet Take 1,600 mg by mouth 3 (three) times daily with meals.   Yes [provider]   Allergies  Allergen Reactions  . Coumadin [Warfarin Sodium]  Caused GIB   Review of Systems  Physical Exam  Constitutional: She is oriented to person, place, and time. No distress.  Makes and keeps eye contact, calm and cooperative  HENT:  Head: Normocephalic and atraumatic.  Cardiovascular: Normal rate and regular rhythm.  Pulmonary/Chest: Effort normal. No respiratory distress.  Abdominal: Soft. She exhibits no distension.  Musculoskeletal: She exhibits edema.  Edema bilateral lower extremities right greater than left  Neurological: She is alert and oriented to person, place, and time.  Skin: Skin is warm and dry.  Nursing note and vitals reviewed.   Vital Signs: BP (!) 105/53   Pulse (!) 116   Temp 98.4 F (36.9 C) (Oral)   Resp 16   Ht 5\' 4"  (1.626 m)   Wt 68.1 kg (150 lb 2.1 oz)   SpO2 97%   BMI 25.77 kg/m  Pain Assessment: No/denies pain   Pain Score: 4    SpO2: SpO2: 97 % O2 Device:SpO2: 97 % O2 Flow Rate: .   IO: Intake/output summary:   Intake/Output Summary (Last 24 hours) at 11/26/2017 1420 Last data filed at 11/26/2017 1330 Gross per 24 hour  Intake 800.53 ml  Output 3053 ml  Net -2252.47 ml    LBM: Last BM Date: 11/25/17 Baseline Weight: Weight: 68 kg (150 lb) Most recent weight: Weight: 68.1 kg (150 lb 2.1 oz)     Palliative Assessment/Data:   Flowsheet Rows     Most Recent Value  Intake Tab  Referral Department  Hospitalist  Unit at Time of Referral  Med/Surg Unit  Palliative Care Primary  Diagnosis  Other (Comment) [DVT]  Date Notified  11/25/17  Palliative Care Type  New Palliative care  Reason for referral  Clarify Goals of Care, Psychosocial or Spiritual support  Date of Admission  11/24/17  Date first seen by Palliative Care  11/26/17  # of days Palliative referral response time  1 Day(s)  # of days IP prior to Palliative referral  1  Clinical Assessment  Palliative Performance Scale Score  50%  Pain Max last 24 hours  Not able to report  Pain Min Last 24 hours  Not able to report  Dyspnea Max Last 24 Hours  Not able to report  Dyspnea Min Last 24 hours  Not able to report  Psychosocial & Spiritual Assessment  Palliative Care Outcomes  Patient/Family meeting held?  Yes  Who was at the meeting?  patient at bedside  Palliative Care Outcomes  Clarified goals of care, Provided end of life care assistance, Provided psychosocial or spiritual support, Provided advance care planning      Time In: 1000 Time Out: 1055 Time Total: 55 minutes  Greater than 50%  of this time was spent counseling and coordinating care related to the above assessment and plan.  Signed by: Katheran Awe, NP   Please contact Palliative Medicine Team phone at [phone] for questions and concerns.  For individual provider: See Loretha Stapler

## 2017-11-26 NOTE — Progress Notes (Signed)
Patient received metoprolol from Endoscopy nurse

## 2017-11-26 NOTE — Op Note (Signed)
[facility] Patient Name: Patricia Ayala Procedure Date: 11/26/2017 3:04 PM MRN: [id number] Date of Birth: 03-19-1929 Attending MD: Jonette Eva MD, MD CSN: [id number] Age: 84 Admit Type: Inpatient Procedure:                Upper GI endoscopy WITH COLD FORCEPS BIOPSY Indications:              Follow-up of gastrointestinal bleeding, Personal                            history of peptic ulcer disease. HEPARIN DRIP                            REQUIRED DUE TO ACUTE BIL DVTs. Providers:                Jonette Eva MD, MD, Criselda Peaches. Patsy Lager, RN, Jannett Celestine, RN, Edythe Clarity, Technician Referring MD:             [name], MD Medicines:                Fentanyl 75 micrograms IV, Midazolam 5 mg IV Complications:            No immediate complications. Estimated Blood Loss:     Estimated blood loss was minimal. Procedure:                Pre-Anesthesia Assessment:                           - Prior to the procedure, a History and Physical                            was performed, and patient medications and                            allergies were reviewed. The patient's tolerance of                            previous anesthesia was also reviewed. The risks                            and benefits of the procedure and the sedation                            options and risks were discussed with the patient.                            All questions were answered, and informed consent                            was obtained. Prior Anticoagulants: The patient has                            taken heparin, last dose was day of procedure.  UNABLE TO OBTAON ADDITIONAL IV. HEPARIN GTT HELD                            FOR 24 MINS TO COMPLETE PROCEDURE. RE-STARTED AT                            COMPLETION OF EGD. ASA Grade Assessment: III - A                            patient with severe systemic disease. After                            reviewing  the risks and benefits, the patient was                            deemed in satisfactory condition to undergo the                            procedure. After obtaining informed consent, the                            endoscope was passed under direct vision.                            Throughout the procedure, the patient's blood                            pressure, pulse, and oxygen saturations were                            monitored continuously. The EG-[id number] ([id number])                            scope was introduced through the mouth, and                            advanced to the second part of duodenum. The upper                            GI endoscopy was somewhat difficult due to the                            patient's agitation and the patient's position                            intolerance. Successful completion of the procedure                            was aided by increasing the dose of sedation                            medication and receiving assistance from additional  staff. The patient tolerated the procedure fairly                            well. BIOPSY SITE WITHOUT ACTIVE BLEEDING AT ENDO                            OF PROCEDURE. Scope In: 3:43:42 PM Scope Out: 3:51:43 PM Total Procedure Duration: 0 hours 8 minutes 1 second  Findings:      The examined esophagus was normal.      Diffuse moderate inflammation characterized by congestion (edema) and       erythema was found in the entire examined stomach. Biopsies were taken       with a cold forceps for Helicobacter pylori testing.      The examined duodenum was normal. Impression:               - PUD RESOLVED                           - MODERATE Gastritis. Biopsied. Moderate Sedation:      Moderate (conscious) sedation was administered by the endoscopy nurse       and supervised by the endoscopist. The following parameters were       monitored: oxygen saturation, heart rate, blood  pressure, and response       to care. Total physician intraservice time was 24 minutes. Recommendation:           - Await pathology results.                           - Continue present medications. RESUME HEPARIN AT                            950 UNITS/HR.                           - Use Protonix (pantoprazole) 40 mg PO daily.                           - Low sodium diet.                           - Return to my office in 2 months.                           - Return patient to hospital ward for ongoing care. Procedure Code(s):        --- Professional ---                           [phone], Esophagogastroduodenoscopy, flexible,                            transoral; with biopsy, single or multiple                           99152, Moderate sedation services provided by the  same physician or other qualified health care                            professional performing the diagnostic or                            therapeutic service that the sedation supports,                            requiring the presence of an independent trained                            observer to assist in the monitoring of the                            patient's level of consciousness and physiological                            status; initial 15 minutes of intraservice time,                            patient age 52 years or older                           [phone], Moderate sedation services; each additional                            15 minutes intraservice time Diagnosis Code(s):        --- Professional ---                           K29.70, Gastritis, unspecified, without bleeding                           K92.2, Gastrointestinal hemorrhage, unspecified                           Z87.11, Personal history of peptic ulcer disease CPT copyright 2016 [employer]. All rights reserved. The codes documented in this report are preliminary and upon coder review may  be revised to  meet current compliance requirements. Jonette Eva, MD Jonette Eva MD, MD 11/26/2017 4:03:15 PM This report has been signed electronically. Number of Addenda: 0

## 2017-11-26 NOTE — Consult Note (Signed)
Referring Provider: Dr. Gonzella Lex  Primary Care Physician:  [name], MD Primary Gastroenterologist:  Dr. Myrtie Neither, L[facility]   Date of Admission: 11/24/17 Date of Consultation: 11/26/17  Reason for Consultation:  History of GI Bleed secondary to PUD in April 2018 on anticoagulation, now with need for anticoagulation resumption due to bilateral extensive DVTs     HPI:  Patricia Ayala is a 84 y.o. year old female with a history of an upper GI bleed in April 2018 secondary to PUD in the setting of chronic Coumadin and concomitant aspirin and Aleve. She had originally presented last year to [facility] with large volume melena and began having frank hematemesis while in the ED and was hemodynamically unstable. Severe anemia. She was transferred to [facility]. She had multiple units of FFP and 5 units PRBCs. She required intubation for EGD. Findings in April 2018 of  normal esophagus, red blood in gastric fundus and body, non-bleeding gastric ulcer with visible vessel s/p injection, clip, and cautery, normal duodenum, no specimens collected. H.pylori serology positive and was treated with course of PPI, pepto, metronidazole, and doxycycline. She was seen as an outpatient in June 2018 for hospital follow-up by Dr. Diannia Ruder. Due to her frail condition, it was felt best to hold on surveillance endoscopy to confirm ulcer healing. She was taken off Coumadin due to the severe GI bleed. She also discontinued all NSAIDs and has not had any further bleeding since April 2018.   She presented on 1/27 with lower extremity edema and pain. Found to have extensive DVT of bilateral lower extremities. On Heparin with plans to transition to Coumadin. Multiple other chronic conditions to include ESRD, afib with RVR, chronic anemia, history of heart failure. Last ECHO in Jan 2018 with EF 55%. Hgb  11.5 today. On admission 12.1. Has been steady past few days. No overt GI bleeding. Denies abdominal pain, N/V. During time of  consultation, she is undergoing dialysis. Denies dysphagia. States she was having looser stools prior to admission, but "this has checked up". She states has not used any NSAIDs since hospitalized in April of last year. She states her appetite is not that great: "If I get a taste for something, I eat it right then or it goes away".   Weight 160 as outpatient in June 2018 with GI. Now 150. She was not on a long-term PPI prior to admission. Instructed to discontinue PPI when the month supply in June 2018 ran out.   Past Medical History:  Diagnosis Date  . Anemia 02/11/2012  . Anxiety   . Aortic stenosis    mild AS 10/2016 echo  . Arthritis    arthritis in neck  . Atrial fibrillation (HCC)   . CHF (congestive heart failure) (HCC)   . Chronic kidney disease    hemodialysis Tu/Th/Sa  . Dyspnea    on exertion  . GERD (gastroesophageal reflux disease)   . GI bleed 01/2017   due to gastric ulcer, taking NSAIDs while on coumadin  . H/O metabolic acidosis   . Headache   . Hyperlipidemia   . Hyperparathyroidism, secondary (HCC)   . Hypertension    Dr. Regino Schultze, [place], [place]  . Peptic ulcer   . Peripheral vascular disease (HCC)   . Vitamin D deficiency     Past Surgical History:  Procedure Laterality Date  . A/V FISTULAGRAM N/A 05/10/2017   Procedure: A/V Fistulagram;  Surgeon: Sherren Kerns, MD;  Location: [facility] INVASIVE CV LAB;  Service: Cardiovascular;  Laterality: N/A;  .  A/V FISTULAGRAM Left 11/18/2017   Procedure: A/V FISTULAGRAM;  Surgeon: Fransisco Hertz, MD;  Location: [facility] INVASIVE CV LAB;  Service: Cardiovascular;  Laterality: Left;  . ABDOMINAL HYSTERECTOMY    . AV FISTULA PLACEMENT  04/01/2012   Procedure: ARTERIOVENOUS (AV) FISTULA CREATION;  Surgeon: Sherren Kerns, MD;  Location: [facility] OR;  Service: Vascular;  Laterality: Left;  . AV FISTULA PLACEMENT Left 06/24/2017   Procedure: ARTERIOVENOUS (AV) FISTULA CREATION;  Surgeon: Maeola Harman, MD;  Location: [facility] OR;   Service: Vascular;  Laterality: Left;  . BASCILIC VEIN TRANSPOSITION Left 08/26/2017   Procedure: BASILIC VEIN TRANSPOSITION SECOND STAGE;  Surgeon: Maeola Harman, MD;  Location: [facility] OR;  Service: Vascular;  Laterality: Left;  . CHOLECYSTECTOMY    . COLON SURGERY     for a blockage  . ESOPHAGOGASTRODUODENOSCOPY N/A 02/17/2017   Dr. Myrtie Neither: normal esophagus, red blood in gastric fundus and body, non-bleeding gastric ulcer with visible vessel s/p injection, clip, and cautery, normal duodenum, no specimens collected. H.pylori serology positive   . EYE SURGERY     bilateral cataracts, /w IOL - 2013  . FISTULOGRAM N/A 11/05/2014   Procedure: FISTULOGRAM;  Surgeon: Larina Earthly, MD;  Location: [facility] CATH LAB;  Service: Cardiovascular;  Laterality: N/A;  . INSERTION OF DIALYSIS CATHETER  04/10/2012   Procedure: INSERTION OF DIALYSIS CATHETER;  Surgeon: Pryor Ochoa, MD;  Location: [facility] OR;  Service: Vascular;  Laterality: N/A;  Insertion Diatek Catheter Right Internal Jugular  . LIGATION OF COMPETING BRANCHES OF ARTERIOVENOUS FISTULA  11/07/2012   Procedure: LIGATION OF COMPETING BRANCHES OF ARTERIOVENOUS FISTULA;  Surgeon: Chuck Hint, MD;  Location: [facility] OR;  Service: Vascular;  Laterality: Left;  Brachio/Cephalic Fistula  . SHUNTOGRAM N/A 11/03/2012   Procedure: Betsey Amen;  Surgeon: Chuck Hint, MD;  Location: [facility] CATH LAB;  Service: Cardiovascular;  Laterality: N/A;  . SHUNTOGRAM Left 03/09/2013   Procedure: Fistulogram;  Surgeon: Fransisco Hertz, MD;  Location: [facility] CATH LAB;  Service: Cardiovascular;  Laterality: Left;  . SHUNTOGRAM Left 11/23/2013   Procedure: FISTULOGRAM;  Surgeon: Fransisco Hertz, MD;  Location: [facility] CATH LAB;  Service: Cardiovascular;  Laterality: Left;    Prior to Admission medications   Medication Sig Start Date End Date Taking? Authorizing Provider  ALPRAZolam Prudy Feeler) 0.5 MG tablet Take 0.5 mg by mouth 3 (three) times daily as needed for anxiety.   Yes [provider]  diltiazem (CARDIZEM) 60 MG tablet Take 60 mg by mouth daily.   Yes [provider]  doxycycline (VIBRA-TABS) 100 MG tablet Take 1 tablet by mouth 2 (two) times daily. 11/21/17  Yes [provider]  HYDROcodone-acetaminophen (NORCO) 10-325 MG tablet Take 1 tablet by mouth 4 (four) times daily as needed for moderate pain.    Yes [provider]  hydrOXYzine (ATARAX/VISTARIL) 25 MG tablet Take 1 tablet by mouth 4 (four) times daily as needed for itching. 11/21/17  Yes [provider]  metoprolol tartrate (LOPRESSOR) 25 MG tablet Take 25 mg by mouth 2 (two) times daily.   Yes [provider]  multivitamin (RENA-VIT) TABS tablet Take 1 tablet by mouth daily.   Yes [provider]  promethazine (PHENERGAN) 25 MG tablet Take 25 mg by mouth every 6 (six) hours as needed for nausea or vomiting.   Yes [provider]  sevelamer (RENAGEL) 800 MG tablet Take 1,600 mg by mouth 3 (three) times daily with meals.   Yes [provider]  Current Facility-Administered Medications  Medication Dose Route Frequency Provider Last Rate Last Dose  . 0.9 %  sodium chloride infusion  250 mL Intravenous PRN Emokpae, Courage, MD      . acetaminophen (TYLENOL) tablet 650 mg  650 mg Oral Q6H PRN Emokpae, Courage, MD       Or  . acetaminophen (TYLENOL) suppository 650 mg  650 mg Rectal Q6H PRN Emokpae, Courage, MD      . albuterol (PROVENTIL) (2.5 MG/3ML) 0.083% nebulizer solution 2.5 mg  2.5 mg Nebulization Q2H PRN Emokpae, Courage, MD      . ALPRAZolam Prudy Feeler) tablet 0.5 mg  0.5 mg Oral TID PRN Shon Hale, MD   0.5 mg at 11/25/17 2215  . diltiazem (CARDIZEM) tablet 30 mg  30 mg Oral TID Shon Hale, MD   30 mg at 11/25/17 2214  . heparin ADULT infusion 100 units/mL (25000 units/23mL sodium chloride 0.45%)  950 Units/hr Intravenous Continuous Dhungel, Nishant, MD 9.5 mL/hr at 11/26/17 0848 950 Units/hr at 11/26/17 0848  .  HYDROcodone-acetaminophen (NORCO) 10-325 MG per tablet 1 tablet  1 tablet Oral QID PRN Shon Hale, MD   1 tablet at 11/25/17 2214  . hydrOXYzine (ATARAX/VISTARIL) tablet 25 mg  25 mg Oral QID PRN Emokpae, Courage, MD      . metoprolol tartrate (LOPRESSOR) tablet 25 mg  25 mg Oral BID Mariea Clonts, Courage, MD   25 mg at 11/25/17 2215  . morphine 2 MG/ML injection 2 mg  2 mg Intravenous Q4H PRN Emokpae, Courage, MD      . multivitamin (RENA-VIT) tablet 1 tablet  1 tablet Oral Daily Shon Hale, MD   1 tablet at 11/26/17 0844  . ondansetron (ZOFRAN) tablet 4 mg  4 mg Oral Q6H PRN Emokpae, Courage, MD       Or  . ondansetron (ZOFRAN) injection 4 mg  4 mg Intravenous Q6H PRN Emokpae, Courage, MD      . pantoprazole (PROTONIX) EC tablet 40 mg  40 mg Oral Daily Emokpae, Courage, MD   40 mg at 11/26/17 0844  . polyethylene glycol (MIRALAX / GLYCOLAX) packet 17 g  17 g Oral Daily PRN Emokpae, Courage, MD      . promethazine (PHENERGAN) tablet 25 mg  25 mg Oral Q6H PRN Emokpae, Courage, MD      . senna (SENOKOT) tablet 8.6 mg  1 tablet Oral BID Mariea Clonts, Courage, MD   8.6 mg at 11/25/17 2214  . sevelamer carbonate (RENVELA) tablet 800 mg  800 mg Oral TID WC Emokpae, Courage, MD   800 mg at 11/26/17 0853  . sodium chloride flush (NS) 0.9 % injection 3 mL  3 mL Intravenous Q12H Emokpae, Courage, MD   3 mL at 11/26/17 0854  . sodium chloride flush (NS) 0.9 % injection 3 mL  3 mL Intravenous PRN Emokpae, Courage, MD      . traZODone (DESYREL) tablet 50 mg  50 mg Oral QHS PRN Mariea Clonts, Courage, MD   50 mg at 11/25/17 2214    Allergies as of 11/24/2017 - Review Complete 11/24/2017  Allergen Reaction Noted  . Coumadin [warfarin sodium]  11/18/2017    Family History  Problem Relation Age of Onset  . Stroke Mother   . Cancer Father   . Heart attack Father     Social History   Socioeconomic History  . Marital status: Married    Spouse name: Not on file  . Number of children: Not on file  . Years  of education:  Not on file  . Highest education level: Not on file  Social Needs  . Financial resource strain: Not on file  . Food insecurity - worry: Not on file  . Food insecurity - inability: Not on file  . Transportation needs - medical: Not on file  . Transportation needs - non-medical: Not on file  Occupational History  . Not on file  Tobacco Use  . Smoking status: Never Smoker  . Smokeless tobacco: Never Used  Substance and Sexual Activity  . Alcohol use: No  . Drug use: No  . Sexual activity: Not on file  Other Topics Concern  . Not on file  Social History Narrative  . Not on file    Review of Systems: Gen: see HPI  CV: Denies chest pain, heart palpitations, syncope, edema  Resp: Denies shortness of breath with rest, cough, wheezing GI: see HPI  GU : Denies urinary burning, urinary frequency, urinary incontinence.  MS: see HPI  Derm: Denies rash, itching, dry skin Psych: Denies depression, anxiety,confusion, or memory loss Heme: see HPI   Physical Exam: Vital signs in last 24 hours: Temp:  [98.3 F (36.8 C)-98.7 F (37.1 C)] 98.6 F (37 C) (01/29 0557) Pulse Rate:  [67-99] 69 (01/29 0557) Resp:  [18-20] 18 (01/29 0557) BP: (125-144)/(71-93) 144/89 (01/29 0557) SpO2:  [96 %-99 %] 96 % (01/29 0557) Last BM Date: 11/25/17 General:   Alert,  Well-developed, well-nourished, pleasant and cooperative in NAD Head:  Normocephalic and atraumatic. Eyes:  Sclera clear, no icterus.    Ears:  Normal auditory acuity. Nose:  No deformity, discharge,  or lesions. Mouth:  No deformity or lesions Lungs:  Clear throughout to auscultation. Heart:  S1 S2 present, irregularly irregular, systolic murmur appreciated  Abdomen:  Soft, nontender and nondistended. No masses, hepatosplenomegaly or hernias noted. Normal bowel sounds, without guarding, and without rebound.   Rectal:  Deferred  Extremities:  With pedal edema, 1-2 + lower extremity edema to knee  Neurologic:  Alert and   oriented x4 Psych:  Alert and cooperative. Normal mood and affect.  Intake/Output from previous day: 01/28 0701 - 01/29 0700 In: 1037.5 [P.O.:840; I.V.:197.5] Out: 1300 [Urine:1300] Intake/Output this shift: Total I/O In: 3 [I.V.:3] Out: -   Lab Results: Recent Labs    11/24/17 1053 11/25/17 0547 11/26/17 0537  WBC 10.1 8.0 8.9  HGB 12.1 11.4* 11.5*  HCT 40.3 37.9 37.3  PLT 238 257 297   BMET Recent Labs    11/24/17 1053 11/25/17 0547 11/26/17 0537  NA 139 140 140  K 3.6 3.5 3.6  CL 99* 102 102  CO2 25 24 24   GLUCOSE 120* 82 99  BUN 20 27* 34*  CREATININE 4.63* 5.48* 6.88*  CALCIUM 10.2 9.5 9.7   LFT Recent Labs    11/24/17 1053 11/26/17 0537  PROT 6.1*  --   ALBUMIN 3.0* 2.5*  AST 16  --   ALT 13*  --   ALKPHOS 84  --   BILITOT 0.7  --    PT/INR Recent Labs    11/24/17 1053  LABPROT 12.8  INR 0.97    Studies/Results: Dg Ankle Complete Left  Result Date: 11/24/2017 CLINICAL DATA:  Pain and swelling for 3 months EXAM: LEFT ANKLE COMPLETE - 3+ VIEW COMPARISON:  None FINDINGS: Diffuse soft tissue swelling. Osseous demineralization. Joint spaces preserved. No acute fracture, dislocation, or bone destruction. Scattered soft tissue calcifications at lower leg including phleboliths and vascular calcifications extending into foot. IMPRESSION: Significant  soft tissue swelling and osseous demineralization without acute bony abnormalities. Electronically Signed   By: Ulyses Southward M.D.   On: 11/24/2017 13:19   Dg Ankle Complete Right  Result Date: 11/24/2017 CLINICAL DATA:  Pain and swelling for 3 months EXAM: RIGHT ANKLE - COMPLETE 3+ VIEW COMPARISON:  None FINDINGS: Marked soft tissue swelling. Diffuse osseous demineralization. Joint spaces preserved. No acute fracture, dislocation, or bone destruction. Small vessel vascular calcifications extending into foot. IMPRESSION: Significant soft tissue swelling and osseous demineralization without acute bony  abnormality. Electronically Signed   By: Ulyses Southward M.D.   On: 11/24/2017 13:19   US Venous Img Lower Bilateral  Result Date: 11/24/2017 CLINICAL DATA:  Pain and swelling in both lower extremities for 3 months EXAM: BILATERAL LOWER EXTREMITY VENOUS DOPPLER ULTRASOUND TECHNIQUE: Gray-scale sonography with graded compression, as well as color Doppler and duplex ultrasound were performed to evaluate the lower extremity deep venous systems from the level of the common femoral vein and including the common femoral, femoral, profunda femoral, popliteal and calf veins including the posterior tibial, peroneal and gastrocnemius veins when visible. The superficial great saphenous vein was also interrogated. Spectral Doppler was utilized to evaluate flow at rest and with distal augmentation maneuvers in the common femoral, femoral and popliteal veins. COMPARISON:  11/06/2016 FINDINGS: RIGHT LOWER EXTREMITY Common Femoral Vein: Nonocclusive hypoechoic thrombus with impaired compressibility and diminished flow Saphenofemoral Junction: No evidence of thrombus. Normal compressibility and flow on color Doppler imaging. Profunda Femoral Vein: No evidence of thrombus. Normal compressibility and flow on color Doppler imaging. Femoral Vein: Nonocclusive hypoechoic thrombus with impaired compressibility and diminished spontaneous venous flow Popliteal Vein: Nonocclusive hypoechoic thrombus with impaired compressibility and diminished spontaneous venous flow Calf Veins: Occlusive thrombus of the perineal and posterior tibial veins Superficial Great Saphenous Vein: No evidence of thrombus. Normal compressibility. Venous Reflux:  None. Other Findings: Baker cyst RIGHT popliteal fossa 5.2 x 1.9 x 3.8 cm. Scattered subcutaneous edema. LEFT LOWER EXTREMITY Common Femoral Vein: Occlusive hypoechoic thrombus with impaired compressibility and absent spontaneous venous flow Saphenofemoral Junction: Thrombus at the saphenofemoral junction.  Profunda Femoral Vein: No evidence of thrombus. Normal compressibility and flow on color Doppler imaging. Femoral Vein: Non occlusive hypoechoic thrombus proximally with occlusive thrombus distally Popliteal Vein: No evidence of thrombus. Normal compressibility, respiratory phasicity and response to augmentation. Calf Veins: No evidence of thrombus. Normal compressibility and flow on color Doppler imaging. Superficial Great Saphenous Vein: No evidence of thrombus in greater saphenous vein. Hypoechoic thrombus seen within lesser saphenous vein with impaired compressibility. Venous Reflux:  None. Other Findings:  Scattered subcutaneous edema IMPRESSION: Positive exam for presence of acute appearing deep venous thrombosis in the lower extremities bilaterally involving BILATERAL common femoral veins, BILATERAL femoral veins, and RIGHT popliteal vein into RIGHT calf veins. Baker cyst RIGHT popliteal fossa. Electronically Signed   By: Ulyses Southward M.D.   On: 11/24/2017 13:12   Dg Foot Complete Left  Result Date: 11/24/2017 CLINICAL DATA:  Pain and swelling for 3 months EXAM: LEFT FOOT - COMPLETE 3+ VIEW COMPARISON:  None. FINDINGS: Significant diffuse soft tissue swelling. Osseous demineralization. Joint spaces preserved. Small vessel vascular calcifications. Calcaneal spurring. No acute fracture, dislocation or bone destruction. IMPRESSION: Significant soft tissue swelling and osseous demineralization without acute bony abnormalities. Calcaneal spurring. Electronically Signed   By: Ulyses Southward M.D.   On: 11/24/2017 13:17   Dg Foot Complete Right  Result Date: 11/24/2017 CLINICAL DATA:  Pain and swelling for 3 months EXAM: RIGHT FOOT  COMPLETE - 3+ VIEW COMPARISON:  None FINDINGS: Significant diffuse soft tissue edema. Osseous demineralization. Joint spaces preserved. No acute fracture, dislocation, or bone destruction. Small plantar calcaneal spur. Small vessel vascular calcifications. IMPRESSION: Osseous  demineralization and significant soft tissue swelling without acute bony abnormalities. Calcaneal spur. Electronically Signed   By: Ulyses Southward M.D.   On: 11/24/2017 13:15    Impression: 84 year old very pleasant female with history of significant upper GI bleed in April 2018 secondary to PUD in the setting of anticoagulation (Coumadin) and concomitant NSAIDs, who has been off of anticoagulation and PPI until now when found to have extensive bilateral lower extremity DVTs. Prior endoscopic therapy in April 2018 as noted in HPI, with visible vessel s/p intervention. H.pylori serology was positive, and she was treated. Surveillance EGD not undertaken per primary GI's discretion in June 2018 due to her frail state. Prior PUD secondary to NSAIDs and less likely H.pylori, and she has abstained from all NSAIDs since that time. I do note that she has been off of a PPI since around June of last year. She has no overt GI bleeding or concerning upper GI symptoms. Heparin drip has been started with plans for Coumadin due to extensive DVTs. GI has been consulted for consideration of surveillance EGD to ensure healing of prior PUD due to need for anticoagulation.   In this setting, EGD could potentially be performed on anticoagulation. Agree with PPI empirically. I discussed with her continuing to avoid NSAIDs indefinitely. She is undergoing dialysis today. Will make her NPO for now and discuss surveillance EGD and management of anticoagulation with Dr. Darrick Penna.   Plan: NPO after midnight Continue PPI therapy Absolute avoidance of NSAIDs Dialysis today Further recommendations to follow   Gelene Mink, PhD, ANP-BC [facility] Gastroenterology     LOS: 2 days    11/26/2017, 9:21 AM

## 2017-11-26 NOTE — OR Nursing (Signed)
Dr. Darrick Penna notified of heart rate 130-152 bpm and in atrial fibrillation. BP 124/82. Order received and carried out.

## 2017-11-26 NOTE — Progress Notes (Signed)
Heparin paused at 1527 to sedate patient; restarted heparin at 1543 at conclusion of EGD.

## 2017-11-26 NOTE — H&P (View-Only) (Signed)
Referring Provider: Dr. Dhungel  Primary Care Physician:  Fusco, Lawrence, MD Primary Gastroenterologist:  Dr. Danis, [facility]   Date of Admission: 11/24/17 Date of Consultation: 11/26/17  Reason for Consultation:  History of GI Bleed secondary to PUD in April 2018 on anticoagulation, now with need for anticoagulation resumption due to bilateral extensive DVTs     HPI:  Patricia Ayala is a 84 y.o. year old female with a history of an upper GI bleed in April 2018 secondary to PUD in the setting of chronic Coumadin and concomitant aspirin and Aleve. She had originally presented last year to [place] with large volume melena and began having frank hematemesis while in the ED and was hemodynamically unstable. Severe anemia. She was transferred to [facility]. She had multiple units of FFP and 5 units PRBCs. She required intubation for EGD. Findings in April 2018 of  normal esophagus, red blood in gastric fundus and body, non-bleeding gastric ulcer with visible vessel s/p injection, clip, and cautery, normal duodenum, no specimens collected. H.pylori serology positive and was treated with course of PPI, pepto, metronidazole, and doxycycline. She was seen as an outpatient in June 2018 for hospital follow-up by Dr. Sanis. Due to her frail condition, it was felt best to hold on surveillance endoscopy to confirm ulcer healing. She was taken off Coumadin due to the severe GI bleed. She also discontinued all NSAIDs and has not had any further bleeding since April 2018.   She presented on 1/27 with lower extremity edema and pain. Found to have extensive DVT of bilateral lower extremities. On Heparin with plans to transition to Coumadin. Multiple other chronic conditions to include ESRD, afib with RVR, chronic anemia, history of heart failure. Last ECHO in Jan 2018 with EF 55%. Hgb  11.5 today. On admission 12.1. Has been steady past few days. No overt GI bleeding. Denies abdominal pain, N/V. During time of  consultation, she is undergoing dialysis. Denies dysphagia. States she was having looser stools prior to admission, but "this has checked up". She states has not used any NSAIDs since hospitalized in April of last year. She states her appetite is not that great: "If I get a taste for something, I eat it right then or it goes away".   Weight 160 as outpatient in June 2018 with GI. Now 150. She was not on a long-term PPI prior to admission. Instructed to discontinue PPI when the month supply in June 2018 ran out.   Past Medical History:  Diagnosis Date  . Anemia 02/11/2012  . Anxiety   . Aortic stenosis    mild AS 10/2016 echo  . Arthritis    arthritis in neck  . Atrial fibrillation (HCC)   . CHF (congestive heart failure) (HCC)   . Chronic kidney disease    hemodialysis Tu/Th/Sa  . Dyspnea    on exertion  . GERD (gastroesophageal reflux disease)   . GI bleed 01/2017   due to gastric ulcer, taking NSAIDs while on coumadin  . H/O metabolic acidosis   . Headache   . Hyperlipidemia   . Hyperparathyroidism, secondary (HCC)   . Hypertension    Dr. McGough, [place], [place]  . Peptic ulcer   . Peripheral vascular disease (HCC)   . Vitamin D deficiency     Past Surgical History:  Procedure Laterality Date  . A/V FISTULAGRAM N/A 05/10/2017   Procedure: A/V Fistulagram;  Surgeon: Fields, Charles E, MD;  Location: [facility];  Service: Cardiovascular;  Laterality: N/A;  .   A/V FISTULAGRAM Left 11/18/2017   Procedure: A/V FISTULAGRAM;  Surgeon: Chen, Brian L, MD;  Location: [facility];  Service: Cardiovascular;  Laterality: Left;  . ABDOMINAL HYSTERECTOMY    . AV FISTULA PLACEMENT  04/01/2012   Procedure: ARTERIOVENOUS (AV) FISTULA CREATION;  Surgeon: Charles E Fields, MD;  Location: [facility];  Service: Vascular;  Laterality: Left;  . AV FISTULA PLACEMENT Left 06/24/2017   Procedure: ARTERIOVENOUS (AV) FISTULA CREATION;  Surgeon: Cain, Brandon Christopher, MD;  Location: [facility];   Service: Vascular;  Laterality: Left;  . BASCILIC VEIN TRANSPOSITION Left 08/26/2017   Procedure: BASILIC VEIN TRANSPOSITION SECOND STAGE;  Surgeon: Cain, Brandon Christopher, MD;  Location: [facility];  Service: Vascular;  Laterality: Left;  . CHOLECYSTECTOMY    . COLON SURGERY     for a blockage  . ESOPHAGOGASTRODUODENOSCOPY N/A 02/17/2017   Dr. Danis: normal esophagus, red blood in gastric fundus and body, non-bleeding gastric ulcer with visible vessel s/p injection, clip, and cautery, normal duodenum, no specimens collected. H.pylori serology positive   . EYE SURGERY     bilateral cataracts, /w IOL - 2013  . FISTULOGRAM N/A 11/05/2014   Procedure: FISTULOGRAM;  Surgeon: Todd F Early, MD;  Location: [facility];  Service: Cardiovascular;  Laterality: N/A;  . INSERTION OF DIALYSIS CATHETER  04/10/2012   Procedure: INSERTION OF DIALYSIS CATHETER;  Surgeon: James D Lawson, MD;  Location: [facility];  Service: Vascular;  Laterality: N/A;  Insertion Diatek Catheter Right Internal Jugular  . LIGATION OF COMPETING BRANCHES OF ARTERIOVENOUS FISTULA  11/07/2012   Procedure: LIGATION OF COMPETING BRANCHES OF ARTERIOVENOUS FISTULA;  Surgeon: Christopher S Dickson, MD;  Location: [facility];  Service: Vascular;  Laterality: Left;  Brachio/Cephalic Fistula  . SHUNTOGRAM N/A 11/03/2012   Procedure: SHUNTOGRAM;  Surgeon: Christopher S Dickson, MD;  [facility]: [facility];  Service: Cardiovascular;  Laterality: N/A;  . SHUNTOGRAM Left 03/09/2013   Procedure: Fistulogram;  Surgeon: Brian L Chen, MD;  [facility]: [facility];  Service: Cardiovascular;  Laterality: Left;  . SHUNTOGRAM Left 11/23/2013   Procedure: FISTULOGRAM;  Surgeon: Brian L Chen, MD;  [facility]: [facility];  Service: Cardiovascular;  Laterality: Left;    Prior to Admission medications   Medication Sig Start Date End Date Taking? Authorizing Provider  ALPRAZolam (XANAX) 0.5 MG tablet Take 0.5 mg by mouth 3 (three) times daily as needed for anxiety.   Yes [provider]  diltiazem (CARDIZEM) 60 MG tablet Take 60 mg by mouth daily.   Yes [provider]  doxycycline (VIBRA-TABS) 100 MG tablet Take 1 tablet by mouth 2 (two) times daily. 11/21/17  Yes [provider]  HYDROcodone-acetaminophen (NORCO) 10-325 MG tablet Take 1 tablet by mouth 4 (four) times daily as needed for moderate pain.    Yes [provider]  hydrOXYzine (ATARAX/VISTARIL) 25 MG tablet Take 1 tablet by mouth 4 (four) times daily as needed for itching. 11/21/17  Yes [provider]  metoprolol tartrate (LOPRESSOR) 25 MG tablet Take 25 mg by mouth 2 (two) times daily.   Yes [provider]  multivitamin (RENA-VIT) TABS tablet Take 1 tablet by mouth daily.   Yes [provider]  promethazine (PHENERGAN) 25 MG tablet Take 25 mg by mouth every 6 (six) hours as needed for nausea or vomiting.   Yes [provider]  sevelamer (RENAGEL) 800 MG tablet Take 1,600 mg by mouth 3 (three) times daily with meals.   Yes [provider]      Current Facility-Administered Medications  Medication Dose Route Frequency Provider Last Rate Last Dose  . 0.9 %  sodium chloride infusion  250 mL Intravenous PRN Emokpae, Courage, MD      . acetaminophen (TYLENOL) tablet 650 mg  650 mg Oral Q6H PRN Emokpae, Courage, MD       Or  . acetaminophen (TYLENOL) suppository 650 mg  650 mg Rectal Q6H PRN Emokpae, Courage, MD      . albuterol (PROVENTIL) (2.5 MG/3ML) 0.083% nebulizer solution 2.5 mg  2.5 mg Nebulization Q2H PRN Emokpae, Courage, MD      . ALPRAZolam (XANAX) tablet 0.5 mg  0.5 mg Oral TID PRN Emokpae, Courage, MD   0.5 mg at 11/25/17 2215  . diltiazem (CARDIZEM) tablet 30 mg  30 mg Oral TID Emokpae, Courage, MD   30 mg at 11/25/17 2214  . heparin ADULT infusion 100 units/mL (25000 units/250mL sodium chloride 0.45%)  950 Units/hr Intravenous Continuous Dhungel, Nishant, MD 9.5 mL/hr at 11/26/17 0848 950 Units/hr at 11/26/17 0848  .  HYDROcodone-acetaminophen (NORCO) 10-325 MG per tablet 1 tablet  1 tablet Oral QID PRN Emokpae, Courage, MD   1 tablet at 11/25/17 2214  . hydrOXYzine (ATARAX/VISTARIL) tablet 25 mg  25 mg Oral QID PRN Emokpae, Courage, MD      . metoprolol tartrate (LOPRESSOR) tablet 25 mg  25 mg Oral BID Emokpae, Courage, MD   25 mg at 11/25/17 2215  . morphine 2 MG/ML injection 2 mg  2 mg Intravenous Q4H PRN Emokpae, Courage, MD      . multivitamin (RENA-VIT) tablet 1 tablet  1 tablet Oral Daily Emokpae, Courage, MD   1 tablet at 11/26/17 0844  . ondansetron (ZOFRAN) tablet 4 mg  4 mg Oral Q6H PRN Emokpae, Courage, MD       Or  . ondansetron (ZOFRAN) injection 4 mg  4 mg Intravenous Q6H PRN Emokpae, Courage, MD      . pantoprazole (PROTONIX) EC tablet 40 mg  40 mg Oral Daily Emokpae, Courage, MD   40 mg at 11/26/17 0844  . polyethylene glycol (MIRALAX / GLYCOLAX) packet 17 g  17 g Oral Daily PRN Emokpae, Courage, MD      . promethazine (PHENERGAN) tablet 25 mg  25 mg Oral Q6H PRN Emokpae, Courage, MD      . senna (SENOKOT) tablet 8.6 mg  1 tablet Oral BID Emokpae, Courage, MD   8.6 mg at 11/25/17 2214  . sevelamer carbonate (RENVELA) tablet 800 mg  800 mg Oral TID WC Emokpae, Courage, MD   800 mg at 11/26/17 0853  . sodium chloride flush (NS) 0.9 % injection 3 mL  3 mL Intravenous Q12H Emokpae, Courage, MD   3 mL at 11/26/17 0854  . sodium chloride flush (NS) 0.9 % injection 3 mL  3 mL Intravenous PRN Emokpae, Courage, MD      . traZODone (DESYREL) tablet 50 mg  50 mg Oral QHS PRN Emokpae, Courage, MD   50 mg at 11/25/17 2214    Allergies as of 11/24/2017 - Review Complete 11/24/2017  Allergen Reaction Noted  . Coumadin [warfarin sodium]  11/18/2017    Family History  Problem Relation Age of Onset  . Stroke Mother   . Cancer Father   . Heart attack Father     Social History   Socioeconomic History  . Marital status: Married    Spouse name: Not on file  . Number of children: Not on file  . Years  of education:   Not on file  . Highest education level: Not on file  Social Needs  . Financial resource strain: Not on file  . Food insecurity - worry: Not on file  . Food insecurity - inability: Not on file  . Transportation needs - medical: Not on file  . Transportation needs - non-medical: Not on file  Occupational History  . Not on file  Tobacco Use  . Smoking status: Never Smoker  . Smokeless tobacco: Never Used  Substance and Sexual Activity  . Alcohol use: No  . Drug use: No  . Sexual activity: Not on file  Other Topics Concern  . Not on file  Social History Narrative  . Not on file    Review of Systems: Gen: see HPI  CV: Denies chest pain, heart palpitations, syncope, edema  Resp: Denies shortness of breath with rest, cough, wheezing GI: see HPI  GU : Denies urinary burning, urinary frequency, urinary incontinence.  MS: see HPI  Derm: Denies rash, itching, dry skin Psych: Denies depression, anxiety,confusion, or memory loss Heme: see HPI   Physical Exam: Vital signs in last 24 hours: Temp:  [98.3 F (36.8 C)-98.7 F (37.1 C)] 98.6 F (37 C) (01/29 0557) Pulse Rate:  [67-99] 69 (01/29 0557) Resp:  [18-20] 18 (01/29 0557) BP: (125-144)/(71-93) 144/89 (01/29 0557) SpO2:  [96 %-99 %] 96 % (01/29 0557) Last BM Date: 11/25/17 General:   Alert,  Well-developed, well-nourished, pleasant and cooperative in NAD Head:  Normocephalic and atraumatic. Eyes:  Sclera clear, no icterus.    Ears:  Normal auditory acuity. Nose:  No deformity, discharge,  or lesions. Mouth:  No deformity or lesions Lungs:  Clear throughout to auscultation. Heart:  S1 S2 present, irregularly irregular, systolic murmur appreciated  Abdomen:  Soft, nontender and nondistended. No masses, hepatosplenomegaly or hernias noted. Normal bowel sounds, without guarding, and without rebound.   Rectal:  Deferred  Extremities:  With pedal edema, 1-2 + lower extremity edema to knee  Neurologic:  Alert and   oriented x4 Psych:  Alert and cooperative. Normal mood and affect.  Intake/Output from previous day: 01/28 0701 - 01/29 0700 In: 1037.5 [P.O.:840; I.V.:197.5] Out: 1300 [Urine:1300] Intake/Output this shift: Total I/O In: 3 [I.V.:3] Out: -   Lab Results: Recent Labs    11/24/17 1053 11/25/17 0547 11/26/17 0537  WBC 10.1 8.0 8.9  HGB 12.1 11.4* 11.5*  HCT 40.3 37.9 37.3  PLT 238 257 297   BMET Recent Labs    11/24/17 1053 11/25/17 0547 11/26/17 0537  NA 139 140 140  K 3.6 3.5 3.6  CL 99* 102 102  CO2 25 24 24  GLUCOSE 120* 82 99  BUN 20 27* 34*  CREATININE 4.63* 5.48* 6.88*  CALCIUM 10.2 9.5 9.7   LFT Recent Labs    11/24/17 1053 11/26/17 0537  PROT 6.1*  --   ALBUMIN 3.0* 2.5*  AST 16  --   ALT 13*  --   ALKPHOS 84  --   BILITOT 0.7  --    PT/INR Recent Labs    11/24/17 1053  LABPROT 12.8  INR 0.97    Studies/Results: Dg Ankle Complete Left  Result Date: 11/24/2017 CLINICAL DATA:  Pain and swelling for 3 months EXAM: LEFT ANKLE COMPLETE - 3+ VIEW COMPARISON:  None FINDINGS: Diffuse soft tissue swelling. Osseous demineralization. Joint spaces preserved. No acute fracture, dislocation, or bone destruction. Scattered soft tissue calcifications at lower leg including phleboliths and vascular calcifications extending into foot. IMPRESSION: Significant   soft tissue swelling and osseous demineralization without acute bony abnormalities. Electronically Signed   By: Mark  Boles M.D.   On: 11/24/2017 13:19   Dg Ankle Complete Right  Result Date: 11/24/2017 CLINICAL DATA:  Pain and swelling for 3 months EXAM: RIGHT ANKLE - COMPLETE 3+ VIEW COMPARISON:  None FINDINGS: Marked soft tissue swelling. Diffuse osseous demineralization. Joint spaces preserved. No acute fracture, dislocation, or bone destruction. Small vessel vascular calcifications extending into foot. IMPRESSION: Significant soft tissue swelling and osseous demineralization without acute bony  abnormality. Electronically Signed   By: Mark  Boles M.D.   On: 11/24/2017 13:19   Us Venous Img Lower Bilateral  Result Date: 11/24/2017 CLINICAL DATA:  Pain and swelling in both lower extremities for 3 months EXAM: BILATERAL LOWER EXTREMITY VENOUS DOPPLER ULTRASOUND TECHNIQUE: Gray-scale sonography with graded compression, as well as color Doppler and duplex ultrasound were performed to evaluate the lower extremity deep venous systems from the level of the common femoral vein and including the common femoral, femoral, profunda femoral, popliteal and calf veins including the posterior tibial, peroneal and gastrocnemius veins when visible. The superficial great saphenous vein was also interrogated. Spectral Doppler was utilized to evaluate flow at rest and with distal augmentation maneuvers in the common femoral, femoral and popliteal veins. COMPARISON:  11/06/2016 FINDINGS: RIGHT LOWER EXTREMITY Common Femoral Vein: Nonocclusive hypoechoic thrombus with impaired compressibility and diminished flow Saphenofemoral Junction: No evidence of thrombus. Normal compressibility and flow on color Doppler imaging. Profunda Femoral Vein: No evidence of thrombus. Normal compressibility and flow on color Doppler imaging. Femoral Vein: Nonocclusive hypoechoic thrombus with impaired compressibility and diminished spontaneous venous flow Popliteal Vein: Nonocclusive hypoechoic thrombus with impaired compressibility and diminished spontaneous venous flow Calf Veins: Occlusive thrombus of the perineal and posterior tibial veins Superficial Great Saphenous Vein: No evidence of thrombus. Normal compressibility. Venous Reflux:  None. Other Findings: Baker cyst RIGHT popliteal fossa 5.2 x 1.9 x 3.8 cm. Scattered subcutaneous edema. LEFT LOWER EXTREMITY Common Femoral Vein: Occlusive hypoechoic thrombus with impaired compressibility and absent spontaneous venous flow Saphenofemoral Junction: Thrombus at the saphenofemoral junction.  Profunda Femoral Vein: No evidence of thrombus. Normal compressibility and flow on color Doppler imaging. Femoral Vein: Non occlusive hypoechoic thrombus proximally with occlusive thrombus distally Popliteal Vein: No evidence of thrombus. Normal compressibility, respiratory phasicity and response to augmentation. Calf Veins: No evidence of thrombus. Normal compressibility and flow on color Doppler imaging. Superficial Great Saphenous Vein: No evidence of thrombus in greater saphenous vein. Hypoechoic thrombus seen within lesser saphenous vein with impaired compressibility. Venous Reflux:  None. Other Findings:  Scattered subcutaneous edema IMPRESSION: Positive exam for presence of acute appearing deep venous thrombosis in the lower extremities bilaterally involving BILATERAL common femoral veins, BILATERAL femoral veins, and RIGHT popliteal vein into RIGHT calf veins. Baker cyst RIGHT popliteal fossa. Electronically Signed   By: Mark  Boles M.D.   On: 11/24/2017 13:12   Dg Foot Complete Left  Result Date: 11/24/2017 CLINICAL DATA:  Pain and swelling for 3 months EXAM: LEFT FOOT - COMPLETE 3+ VIEW COMPARISON:  None. FINDINGS: Significant diffuse soft tissue swelling. Osseous demineralization. Joint spaces preserved. Small vessel vascular calcifications. Calcaneal spurring. No acute fracture, dislocation or bone destruction. IMPRESSION: Significant soft tissue swelling and osseous demineralization without acute bony abnormalities. Calcaneal spurring. Electronically Signed   By: Mark  Boles M.D.   On: 11/24/2017 13:17   Dg Foot Complete Right  Result Date: 11/24/2017 CLINICAL DATA:  Pain and swelling for 3 months EXAM: RIGHT FOOT   COMPLETE - 3+ VIEW COMPARISON:  None FINDINGS: Significant diffuse soft tissue edema. Osseous demineralization. Joint spaces preserved. No acute fracture, dislocation, or bone destruction. Small plantar calcaneal spur. Small vessel vascular calcifications. IMPRESSION: Osseous  demineralization and significant soft tissue swelling without acute bony abnormalities. Calcaneal spur. Electronically Signed   By: Mark  Boles M.D.   On: 11/24/2017 13:15    Impression: 84-year-old very pleasant female with history of significant upper GI bleed in April 2018 secondary to PUD in the setting of anticoagulation (Coumadin) and concomitant NSAIDs, who has been off of anticoagulation and PPI until now when found to have extensive bilateral lower extremity DVTs. Prior endoscopic therapy in April 2018 as noted in HPI, with visible vessel s/p intervention. H.pylori serology was positive, and she was treated. Surveillance EGD not undertaken per primary GI's discretion in June 2018 due to her frail state. Prior PUD secondary to NSAIDs and less likely H.pylori, and she has abstained from all NSAIDs since that time. I do note that she has been off of a PPI since around June of last year. She has no overt GI bleeding or concerning upper GI symptoms. Heparin drip has been started with plans for Coumadin due to extensive DVTs. GI has been consulted for consideration of surveillance EGD to ensure healing of prior PUD due to need for anticoagulation.   In this setting, EGD could potentially be performed on anticoagulation. Agree with PPI empirically. I discussed with her continuing to avoid NSAIDs indefinitely. She is undergoing dialysis today. Will make her NPO for now and discuss surveillance EGD and management of anticoagulation with Dr. Fields.   Plan: NPO after midnight Continue PPI therapy Absolute avoidance of NSAIDs Dialysis today Further recommendations to follow   Khyle Goodell W. Chamara Dyck, PhD, ANP-BC [facility]     LOS: 2 days    11/26/2017, 9:21 AM    

## 2017-11-26 NOTE — Progress Notes (Signed)
Patient received metoprolol 2. 5 mg from Endoscopy nurse

## 2017-11-26 NOTE — Interval H&P Note (Signed)
History and Physical Interval Note:  11/26/2017 3:20 PM  Patricia Ayala  has presented today for surgery, with the diagnosis of ulcer disease  The various methods of treatment have been discussed with the patient and family. After consideration of risks, benefits and other options for treatment, the patient has consented to  Procedure(s): ESOPHAGOGASTRODUODENOSCOPY (EGD) (N/A) as a surgical intervention .  The patient's history has been reviewed, patient examined, no change in status, stable for surgery.  I have reviewed the patient's chart and labs.  Questions were answered to the patient's satisfaction.     [employer]

## 2017-11-26 NOTE — Progress Notes (Addendum)
Subjective: Interval History: has no complaint of nausea or vomiting.  Her appetite however is still poor.  Patient denies any difficulty breathing..  Objective: Vital signs in last 24 hours: Temp:  [98.3 F (36.8 C)-98.7 F (37.1 C)] 98.6 F (37 C) (01/29 0557) Pulse Rate:  [67-99] 69 (01/29 0557) Resp:  [18-20] 18 (01/29 0557) BP: (125-144)/(71-93) 144/89 (01/29 0557) SpO2:  [96 %-99 %] 96 % (01/29 0557) Weight change:   Intake/Output from previous day: 01/28 0701 - 01/29 0700 In: 1037.5 [P.O.:840; I.V.:197.5] Out: 1300 [Urine:1300] Intake/Output this shift: No intake/output data recorded.  General appearance: alert, cooperative and no distress Resp: clear to auscultation bilaterally Cardio: irregularly irregular rhythm Extremities: edema She has 2+ edema bilaterally  Lab Results: Recent Labs    11/25/17 0547 11/26/17 0537  WBC 8.0 8.9  HGB 11.4* 11.5*  HCT 37.9 37.3  PLT 257 297   BMET:  Recent Labs    11/25/17 0547 11/26/17 0537  NA 140 140  K 3.5 3.6  CL 102 102  CO2 24 24  GLUCOSE 82 99  BUN 27* 34*  CREATININE 5.48* 6.88*  CALCIUM 9.5 9.7   No results for input(s): PTH in the last 72 hours. Iron Studies: No results for input(s): IRON, TIBC, TRANSFERRIN, FERRITIN in the last 72 hours.  Studies/Results: Dg Ankle Complete Left  Result Date: 11/24/2017 CLINICAL DATA:  Pain and swelling for 3 months EXAM: LEFT ANKLE COMPLETE - 3+ VIEW COMPARISON:  None FINDINGS: Diffuse soft tissue swelling. Osseous demineralization. Joint spaces preserved. No acute fracture, dislocation, or bone destruction. Scattered soft tissue calcifications at lower leg including phleboliths and vascular calcifications extending into foot. IMPRESSION: Significant soft tissue swelling and osseous demineralization without acute bony abnormalities. Electronically Signed   By: Ulyses Southward M.D.   On: 11/24/2017 13:19   Dg Ankle Complete Right  Result Date: 11/24/2017 CLINICAL DATA:  Pain  and swelling for 3 months EXAM: RIGHT ANKLE - COMPLETE 3+ VIEW COMPARISON:  None FINDINGS: Marked soft tissue swelling. Diffuse osseous demineralization. Joint spaces preserved. No acute fracture, dislocation, or bone destruction. Small vessel vascular calcifications extending into foot. IMPRESSION: Significant soft tissue swelling and osseous demineralization without acute bony abnormality. Electronically Signed   By: Ulyses Southward M.D.   On: 11/24/2017 13:19   US Venous Img Lower Bilateral  Result Date: 11/24/2017 CLINICAL DATA:  Pain and swelling in both lower extremities for 3 months EXAM: BILATERAL LOWER EXTREMITY VENOUS DOPPLER ULTRASOUND TECHNIQUE: Gray-scale sonography with graded compression, as well as color Doppler and duplex ultrasound were performed to evaluate the lower extremity deep venous systems from the level of the common femoral vein and including the common femoral, femoral, profunda femoral, popliteal and calf veins including the posterior tibial, peroneal and gastrocnemius veins when visible. The superficial great saphenous vein was also interrogated. Spectral Doppler was utilized to evaluate flow at rest and with distal augmentation maneuvers in the common femoral, femoral and popliteal veins. COMPARISON:  11/06/2016 FINDINGS: RIGHT LOWER EXTREMITY Common Femoral Vein: Nonocclusive hypoechoic thrombus with impaired compressibility and diminished flow Saphenofemoral Junction: No evidence of thrombus. Normal compressibility and flow on color Doppler imaging. Profunda Femoral Vein: No evidence of thrombus. Normal compressibility and flow on color Doppler imaging. Femoral Vein: Nonocclusive hypoechoic thrombus with impaired compressibility and diminished spontaneous venous flow Popliteal Vein: Nonocclusive hypoechoic thrombus with impaired compressibility and diminished spontaneous venous flow Calf Veins: Occlusive thrombus of the perineal and posterior tibial veins Superficial Great Saphenous  Vein: No evidence of  thrombus. Normal compressibility. Venous Reflux:  None. Other Findings: Baker cyst RIGHT popliteal fossa 5.2 x 1.9 x 3.8 cm. Scattered subcutaneous edema. LEFT LOWER EXTREMITY Common Femoral Vein: Occlusive hypoechoic thrombus with impaired compressibility and absent spontaneous venous flow Saphenofemoral Junction: Thrombus at the saphenofemoral junction. Profunda Femoral Vein: No evidence of thrombus. Normal compressibility and flow on color Doppler imaging. Femoral Vein: Non occlusive hypoechoic thrombus proximally with occlusive thrombus distally Popliteal Vein: No evidence of thrombus. Normal compressibility, respiratory phasicity and response to augmentation. Calf Veins: No evidence of thrombus. Normal compressibility and flow on color Doppler imaging. Superficial Great Saphenous Vein: No evidence of thrombus in greater saphenous vein. Hypoechoic thrombus seen within lesser saphenous vein with impaired compressibility. Venous Reflux:  None. Other Findings:  Scattered subcutaneous edema IMPRESSION: Positive exam for presence of acute appearing deep venous thrombosis in the lower extremities bilaterally involving BILATERAL common femoral veins, BILATERAL femoral veins, and RIGHT popliteal vein into RIGHT calf veins. Baker cyst RIGHT popliteal fossa. Electronically Signed   By: Ulyses Southward M.D.   On: 11/24/2017 13:12   Dg Foot Complete Left  Result Date: 11/24/2017 CLINICAL DATA:  Pain and swelling for 3 months EXAM: LEFT FOOT - COMPLETE 3+ VIEW COMPARISON:  None. FINDINGS: Significant diffuse soft tissue swelling. Osseous demineralization. Joint spaces preserved. Small vessel vascular calcifications. Calcaneal spurring. No acute fracture, dislocation or bone destruction. IMPRESSION: Significant soft tissue swelling and osseous demineralization without acute bony abnormalities. Calcaneal spurring. Electronically Signed   By: Ulyses Southward M.D.   On: 11/24/2017 13:17   Dg Foot Complete  Right  Result Date: 11/24/2017 CLINICAL DATA:  Pain and swelling for 3 months EXAM: RIGHT FOOT COMPLETE - 3+ VIEW COMPARISON:  None FINDINGS: Significant diffuse soft tissue edema. Osseous demineralization. Joint spaces preserved. No acute fracture, dislocation, or bone destruction. Small plantar calcaneal spur. Small vessel vascular calcifications. IMPRESSION: Osseous demineralization and significant soft tissue swelling without acute bony abnormalities. Calcaneal spur. Electronically Signed   By: Ulyses Southward M.D.   On: 11/24/2017 13:15    I have reviewed the patient's current medications.  Assessment/Plan: 1] history of DVT/PE: Presently patient on heparin and she does not have any chest pain.  Also her leg pain has improved. 2] end-stage renal disease: She states she is status post hemodialysis on Saturday.  She is due for dialysis today.  Her appetite is poor but she did not have any other uremic signs and symptoms. 3] anemia: Her hemoglobin is within her target goal 4] bone and mineral disorder: Her calcium and phosphorus is in range 5] fluid management: Patient at this moment denies any difficulty breathing.  However patient has bilateral leg edema.  Possibly a combination of DVT and fluid overload and difficulty removing fluid.  6] atrial fibrillation: Her heart rate is controlled Plan: We will make arrangements for patient to get dialysis today 2] we will try to remove about 21/2 L if her systolic blood pressure remains above 90  LOS: 2 days   Jernee Murtaugh S 11/26/2017,7:25 AM

## 2017-11-26 NOTE — Progress Notes (Signed)
ANTICOAGULATION CONSULT NOTE - follow up  Pharmacy Consult for HEPARIN bridge with coumadin Indication: VTE treatment  Allergies  Allergen Reactions  . Coumadin [Warfarin Sodium]     Caused GIB   Patient Measurements: Height: 5\' 4"  (162.6 cm) Weight: 144 lb 13.5 oz (65.7 kg) IBW/kg (Calculated) : 54.7 HEPARIN DW (KG): 67.6  Vital Signs: Temp: 98.1 F (36.7 C) (01/29 1427) Temp Source: Oral (01/29 1427) BP: 102/65 (01/29 1600) Pulse Rate: 53 (01/29 1605)  Labs: Recent Labs    11/24/17 1053  11/25/17 0547  11/25/17 2324 11/26/17 0532 11/26/17 0537 11/26/17 1639  HGB 12.1  --  11.4*  --   --   --  11.5*  --   HCT 40.3  --  37.9  --   --   --  37.3  --   PLT 238  --  257  --   --   --  297  --   APTT 34  --   --   --   --   --   --   --   LABPROT 12.8  --   --   --   --   --   --   --   INR 0.97  --   --   --   --   --   --   --   HEPARINUNFRC  --    < > 1.02*   < > 0.32 0.18*  --  0.91*  CREATININE 4.63*  --  5.48*  --   --   --  6.88*  --    < > = values in this interval not displayed.   Estimated Creatinine Clearance: 5.4 mL/min (A) (by C-G formula based on SCr of 6.88 mg/dL (H)).  Medical History: Past Medical History:  Diagnosis Date  . Anemia 02/11/2012  . Anxiety   . Aortic stenosis    mild AS 10/2016 echo  . Arthritis    arthritis in neck  . Atrial fibrillation (HCC)   . CHF (congestive heart failure) (HCC)   . Chronic kidney disease    hemodialysis Tu/Th/Sa  . Dyspnea    on exertion  . GERD (gastroesophageal reflux disease)   . GI bleed 01/2017   due to gastric ulcer, taking NSAIDs while on coumadin  . H/O metabolic acidosis   . Headache   . Hyperlipidemia   . Hyperparathyroidism, secondary (HCC)   . Hypertension    Dr. Regino Schultze, [place], [place]  . Peptic ulcer   . Peripheral vascular disease (HCC)   . Vitamin D deficiency    Medications:  Medications Prior to Admission  Medication Sig Dispense Refill Last Dose  . ALPRAZolam (XANAX) 0.5 MG  tablet Take 0.5 mg by mouth 3 (three) times daily as needed for anxiety.   11/22/2017 at Unknown time  . diltiazem (CARDIZEM) 60 MG tablet Take 60 mg by mouth daily.   11/23/2017 at Unknown time  . doxycycline (VIBRA-TABS) 100 MG tablet Take 1 tablet by mouth 2 (two) times daily.   11/24/2017 at Unknown time  . HYDROcodone-acetaminophen (NORCO) 10-325 MG tablet Take 1 tablet by mouth 4 (four) times daily as needed for moderate pain.    11/24/2017 at Unknown time  . hydrOXYzine (ATARAX/VISTARIL) 25 MG tablet Take 1 tablet by mouth 4 (four) times daily as needed for itching.   11/24/2017 at Unknown time  . metoprolol tartrate (LOPRESSOR) 25 MG tablet Take 25 mg by mouth 2 (two) times daily.   11/24/2017  at 0730  . multivitamin (RENA-VIT) TABS tablet Take 1 tablet by mouth daily.   11/23/2017 at Unknown time  . promethazine (PHENERGAN) 25 MG tablet Take 25 mg by mouth every 6 (six) hours as needed for nausea or vomiting.   unknown  . sevelamer (RENAGEL) 800 MG tablet Take 1,600 mg by mouth 3 (three) times daily with meals.   11/23/2017 at Unknown time    Assessment: Per EMS, pt received dialysis yesterday and reports leg pain ever since. Pt reports history of same with swelling and reports today pain is more intense. Pt reports left foot feels numb since waking this am. Pt not on anticoagulants PTA.  Heparin level is below goal this AM. Now HL slightly elevated and was held for endoscopy this afternoon 1/29. GI performed endoscopy that showed normal esophagus and diffuse moderate inflammation and erythema in the stomach. Now plan to start coumadin and will bridge with heparin.   Goal of Therapy:  INR 2-3 Heparin level 0.3-0.7 units/ml Monitor platelets by anticoagulation protocol: Yes   Plan:   Coumadin 5mg  po x 1 today  PT-INR daily  Decrease Heparin infusion to 850 units/hr  Heparin level in 6-8 hrs then daily  CBC daily while on Heparin  Elder Cyphers, BS Loura Back, [place] Clinical  Pharmacist Pager [phone] 11/26/2017 5:21 PM

## 2017-11-27 ENCOUNTER — Inpatient Hospital Stay (HOSPITAL_COMMUNITY): Payer: Medicare Other

## 2017-11-27 DIAGNOSIS — I351 Nonrheumatic aortic (valve) insufficiency: Secondary | ICD-10-CM

## 2017-11-27 DIAGNOSIS — Z7189 Other specified counseling: Secondary | ICD-10-CM

## 2017-11-27 LAB — ECHOCARDIOGRAM COMPLETE
AVLVOTPG: 3 mmHg
AVPHT: 792 ms
CHL CUP DOP CALC LVOT VTI: 13.4 cm
CHL CUP MV DEC (S): 197
EWDT: 197 ms
FS: 15 % — AB (ref 28–44)
Height: 64 in
IV/PV OW: 1.03
LA ID, A-P, ES: 48 mm
LA diam end sys: 48 mm
LA vol index: 62.3 mL/m2
LA vol: 108 mL
LADIAMINDEX: 2.77 cm/m2
LAVOLA4C: 109 mL
LV SIMPSON'S DISK: 38
LV sys vol: 29 mL
LVDIAVOL: 46 mL (ref 46–106)
LVDIAVOLIN: 27 mL/m2
LVOT SV: 38 mL
LVOT area: 2.84 cm2
LVOT diameter: 19 mm
LVOT peak vel: 80 cm/s
LVSYSVOLIN: 17 mL/m2
MV Peak grad: 4 mmHg
MVPKEVEL: 103 m/s
PW: 15.9 mm — AB (ref 0.6–1.1)
RV sys press: 37 mmHg
Reg peak vel: 269 cm/s
Stroke v: 18 ml
TAPSE: 13.6 mm
TR max vel: 269 cm/s
Weight: 2317.48 oz

## 2017-11-27 LAB — CBC
HCT: 38.7 % (ref 36.0–46.0)
Hemoglobin: 12 g/dL (ref 12.0–15.0)
MCH: 29.4 pg (ref 26.0–34.0)
MCHC: 31 g/dL (ref 30.0–36.0)
MCV: 94.9 fL (ref 78.0–100.0)
Platelets: 273 10*3/uL (ref 150–400)
RBC: 4.08 MIL/uL (ref 3.87–5.11)
RDW: 14.2 % (ref 11.5–15.5)
WBC: 9.1 10*3/uL (ref 4.0–10.5)

## 2017-11-27 LAB — PROTIME-INR
INR: 1.05
PROTHROMBIN TIME: 13.6 s (ref 11.4–15.2)

## 2017-11-27 LAB — HEPARIN LEVEL (UNFRACTIONATED): HEPARIN UNFRACTIONATED: 0.54 [IU]/mL (ref 0.30–0.70)

## 2017-11-27 MED ORDER — WARFARIN SODIUM 5 MG PO TABS
5.0000 mg | ORAL_TABLET | Freq: Once | ORAL | Status: AC
  Administered 2017-11-27: 5 mg via ORAL
  Filled 2017-11-27: qty 1

## 2017-11-27 MED ORDER — DILTIAZEM HCL 60 MG PO TABS
60.0000 mg | ORAL_TABLET | Freq: Three times a day (TID) | ORAL | Status: DC
  Administered 2017-11-27 – 2017-11-28 (×3): 60 mg via ORAL
  Filled 2017-11-27 (×3): qty 1

## 2017-11-27 MED ORDER — DILTIAZEM HCL 25 MG/5ML IV SOLN
10.0000 mg | Freq: Four times a day (QID) | INTRAVENOUS | Status: DC | PRN
  Administered 2017-11-27: 10 mg via INTRAVENOUS
  Filled 2017-11-27: qty 5

## 2017-11-27 NOTE — Progress Notes (Signed)
Daily Progress Note   Patient Name: Patricia Ayala       Date: 11/27/2017 DOB: Sep 03, 1929  Age: 84 y.o. MRN#: [id number] Attending Physician: Eddie North, MD Primary Care Physician: [name], MD Admit Date: 11/24/2017  Reason for Consultation/Follow-up: Establishing goals of care and Psychosocial/spiritual support  Subjective: Mrs. Kinoshita is sitting quietly in bed eating her lunch.  She is calm, cooperative, and pleasant.  Present today at bedside are her 2 daughters, Ellan Lambert who lives locally, and [place].    We talked about Mrs. Bezanson EGD yesterday showing gastritis but no active bleed.  Mrs. Hauth reminds [place] that her bleeding was not caused by Coumadin, but her overuse of over-the-counter medications.  Talk about the use of Coumadin and how to check INR, there is the possibility of an in-home tester.  Daughter Judeth Cornfield asks about the goal of DVT treatment.  I shared that part of the goal is to keep the DVT from enlarging, also to hopefully over time shrink the DVT.  I shared that there is risk at any time that part of the blood clot could break loose.  Judeth Cornfield says that this is what she is heard from other providers.  We talked about the concept of treat the treatable, but allow a natural death (DNR).  As we are discussing this Ellan Lambert is nodding her head in agreement that she would like to allow a natural death for her mother.  Ms. Letitia Libra states that she needs more time to think about this.  I share with family that sometimes older adults abdicate, they do not make decisions, and therefore it falls to family to decide for them.  No further questions at this time, I share that I will follow-up with them as needed.   Length of Stay: 3  Current Medications: Scheduled Meds:    . diltiazem  60 mg Oral Q8H  . metoprolol tartrate  25 mg Oral BID  . multivitamin  1 tablet Oral Daily  . pantoprazole  40 mg Oral Daily  . senna  1 tablet Oral BID  . sevelamer carbonate  800 mg Oral TID WC  . sodium chloride flush  3 mL Intravenous Q12H  . warfarin  5 mg Oral Once  . Warfarin - Pharmacist Dosing Inpatient   Does not apply [phone]  Continuous Infusions: . sodium chloride    . sodium chloride    . sodium chloride    . heparin 850 Units/hr (11/26/17 1841)    PRN Meds: sodium chloride, sodium chloride, sodium chloride, acetaminophen **OR** acetaminophen, albuterol, ALPRAZolam, alteplase, diltiazem, heparin, HYDROcodone-acetaminophen, hydrOXYzine, morphine injection, ondansetron **OR** ondansetron (ZOFRAN) IV, polyethylene glycol, promethazine, sodium chloride flush, traZODone  Physical Exam  Constitutional: She is oriented to person, place, and time. No distress.  HENT:  Head: Normocephalic and atraumatic.  Cardiovascular: Normal rate.  Pulmonary/Chest: Effort normal. No respiratory distress.  Abdominal: Soft. She exhibits no distension.  Musculoskeletal: She exhibits edema.  Neurological: She is alert and oriented to person, place, and time.  Skin: Skin is warm and dry.  Psychiatric: She has a normal mood and affect. Her behavior is normal. Judgment and thought content normal.  Nursing note and vitals reviewed.           Vital Signs: BP 123/76 (BP Location: Right Arm)   Pulse (!) 53   Temp 98.5 F (36.9 C) (Oral)   Resp 16   Ht 5\' 4"  (1.626 m)   Wt 65.7 kg (144 lb 13.5 oz)   SpO2 99%   BMI 24.86 kg/m  SpO2: SpO2: 99 % O2 Device: O2 Device: Not Delivered O2 Flow Rate: O2 Flow Rate (L/min): 2 L/min  Intake/output summary:   Intake/Output Summary (Last 24 hours) at 11/27/2017 1327 Last data filed at 11/27/2017 0600 Gross per 24 hour  Intake 492 ml  Output 2503 ml  Net -2011 ml   LBM: Last BM Date: 11/26/17 Baseline Weight: Weight: 68 kg (150  lb) Most recent weight: Weight: 65.7 kg (144 lb 13.5 oz)       Palliative Assessment/Data:    Flowsheet Rows     Most Recent Value  Intake Tab  Referral Department  Hospitalist  Unit at Time of Referral  Med/Surg Unit  Palliative Care Primary Diagnosis  Other (Comment) [DVT]  Date Notified  11/25/17  Palliative Care Type  New Palliative care  Reason for referral  Clarify Goals of Care, Psychosocial or Spiritual support  Date of Admission  11/24/17  Date first seen by Palliative Care  11/26/17  # of days Palliative referral response time  1 Day(s)  # of days IP prior to Palliative referral  1  Clinical Assessment  Palliative Performance Scale Score  50%  Pain Max last 24 hours  Not able to report  Pain Min Last 24 hours  Not able to report  Dyspnea Max Last 24 Hours  Not able to report  Dyspnea Min Last 24 hours  Not able to report  Psychosocial & Spiritual Assessment  Palliative Care Outcomes  Patient/Family meeting held?  Yes  Who was at the meeting?  patient at bedside  Palliative Care Outcomes  Clarified goals of care, Provided end of life care assistance, Provided psychosocial or spiritual support, Provided advance care planning      Patient Active Problem List   Diagnosis Date Noted  . Palliative care encounter   . DNR (do not resuscitate) discussion   . Extensive DVT of lower extremity, bilateral (HCC) 11/24/2017  . Delirium, acute 05/04/2017  . Delirium 05/04/2017  . H/o Helicobacter pylori ab+   . H/o Chronic gastric ulcer with hemorrhage 01/2017   . Acute post-traumatic headache, not intractable   . Acute blood loss anemia   . Chronic anticoagulation   . Encounter for therapeutic drug monitoring 12/03/2016  . Generalized anxiety disorder 11/12/2016  .  Atrial fibrillation with RVR (HCC) 11/01/2016  . Acute respiratory failure (HCC) 11/01/2016  . URI (upper respiratory infection) 10/30/2016  . ESRD (end stage renal disease) (HCC) 10/29/2016  . Benign  essential HTN 10/29/2016  . HLD (hyperlipidemia) 10/29/2016  . Acute on chronic diastolic CHF (congestive heart failure) (HCC) 10/29/2016  . Pulmonary edema 09/26/2016  . Mechanical complication of other vascular device, implant, and graft 08/07/2013  . Anemia 02/11/2012    Palliative Care Assessment & Plan   Patient Profile: 83 y.o. female  with past medical history of atrial fin no longer on anticoagulation due to history of G.I. Bleed approximately one year ago, in stage renal disease with hemodialysis Tuesday, Thursday, Saturday, anemia of chronic disease, heart failure, GERD, high blood pressure and cholesterol admitted on 11/24/2017 with extensive DVT of lower extremity bilateral.   Assessment: Extensive DVT of bilateral lower extremities; Mrs. Draughn is agreeable to continue to treat the treatable, blood thinners for DVT treatment.  Recommendations/Plan:  At this point, continue to treat the treatable.    Continue CODE STATUS discussions.  Goals of Care and Additional Recommendations:  Limitations on Scope of Treatment: Full Scope Treatment  Code Status:    Code Status Orders  (From admission, onward)        Start     Ordered   11/24/17 1620  Full code  Continuous     11/24/17 1619    Code Status History    Date Active Date Inactive Code Status Order ID Comments User Context   11/18/2017 14:00 11/18/2017 18:51 Full Code [id number]  Fransisco Hertz, MD Inpatient   05/10/2017 14:49 05/10/2017 19:09 Full Code [id number]  Sherren Kerns, MD Inpatient   05/04/2017 12:44 05/08/2017 18:13 Full Code [id number]  Houston Siren, MD Inpatient   02/16/2017 15:16 02/22/2017 18:26 Full Code [id number]  Filbert Schilder, MD Inpatient   11/08/2016 21:59 11/10/2016 20:59 Full Code [id number]  Meredeth Ide, MD Inpatient   11/01/2016 11:00 11/07/2016 21:33 Full Code [id number]  Kathlen Mody, MD Inpatient   10/29/2016 18:00 10/30/2016 19:11 Full Code [id number]  Erick Blinks, MD Inpatient   09/26/2016  09:33 09/29/2016 14:14 Full Code [id number]  Meredeth Ide, MD Inpatient   11/05/2014 10:59 11/05/2014 15:28 Full Code [id number]  Larina Earthly, MD Inpatient       Prognosis:   Unable to determine, based on outcomes.  6 months or less would not be surprising, alternatively 12 months or more would not be surprising based on life support measures from hemodialysis.  Discharge Planning:  Return to home environment is likely  Care plan was discussed with nursing staff, case management, Dr. Gonzella Lex.  Thank you for allowing the Palliative Medicine Team to assist in the care of this patient.   Time In: 1210 Time Out: 1300 Total Time  50 minutes Prolonged Time Billed  yes       Greater than 50%  of this time was spent counseling and coordinating care related to the above assessment and plan.  Katheran Awe, NP  Please contact Palliative Medicine Team phone at [phone] for questions and concerns.

## 2017-11-27 NOTE — Progress Notes (Signed)
PROGRESS NOTE                                                                                                                                                                                                             Patient Demographics:    Patricia Ayala, is a 84 y.o. female, DOB - 12-02-1928, OJJ:[id number]  Admit date - 11/24/2017   Admitting Physician Courage Denton Brick, MD  Outpatient Primary MD for the patient is Redmond School, MD  LOS - 3  Outpatient Specialists: Dr. Bridgett Larsson (vascular surgery)  Chief Complaint  Patient presents with  . Leg Pain       Brief Narrative   84 year old female with ESRD on hemodialysis (T, T, S), A. fib and prior history of DVT off Coumadin since April 2018 due to life-threatening GI bleed from gastric ulcers while on anticoagulation and also taking NSAIDs (she required multiple transfusions and was also placed on ventilator transiently) , anemia of chronic kidney disease who presented with bilateral lower extremity swelling with pain for almost 2 months duration worsened in the past several days. In the ED she was found recently DVT of both lower extremities extending from common femoral vein involving the femoral veins, popliteal and calf veins. ED physician discussed with on-call vascular surgeon Dr. Oneida Alar who recommended no intervention at this time and monitor her on anticoagulation.   Subjective:   Patient complains of pain in her feet.  Became tachycardic after EGD yesterday afternoon and again tachycardia to 140s-150s this morning.  Denies any chest pain or palpitations.  Complains of burning pain in her foot.  Assessment  & Plan :    Principal Problem:   Extensive DVT of lower extremity, bilateral (HCC) On IV heparin drip.  Started on Coumadin on 1/29.  H&H stable.   EGD done  negative for ulcers and shows gastritis, biopsy sent. Given extent of DVT and underlying A. Fib, patient  will need lifelong anticoagulation if tolerated.  Active Problems:   ESRD (end stage renal disease) ([place]) On hemodialysis Tuesdays, Thursdays and Saturdays. Renal following.      Atrial fibrillation with RVR (HCC) Increase Cardizem dose this morning.  Continue metoprolol dose and increase if needed.  As needed IV Cardizem.  Follow 2D echo.    H/o Chronic gastric ulcer with hemorrhage 01/2017 EGD with gastritis, no ulcer. Started  coumadin  Anemia of chronic disease Stable.  Left basic vein transposition with imminent thrombosis  patient had left radial artery cannulation and shuntogram done on 1/21. Showed occlusion of the fistula 2-3 cm distal to the anastomosis with a subtotal occlusion of the anastomosis. Recommends new permanent access to be placed in left arm. Given her acute DVT and need for anticoagulation I think this will have to be postponed for at least 3 months while on active anticoagulation. Continue dialysis with permacath. I will discuss with Dr. Bridgett Larsson this  week.   Goals of care Patient currently full code. Discussed with husband at bedside who agrees with goals of care discussion with palliative care. Husband and daughters involved in care. Pall care met with patient today who expressed full scope of treatment. They will schedule family meeting this week.     Code Status : Full code  Family Communication  : None at bedside  Disposition Plan  : Home once INR therapeutic on Coumadin  Barriers For Discharge : Active symptoms  Consults  :   Nephrology GI  Procedures  :  Doppler lower extremity EGD  DVT Prophylaxis  :  IV heparin/ couamdin  Lab Results  Component Value Date   PLT 273 11/27/2017    Antibiotics  :    Anti-infectives (From admission, onward)   None        Objective:   Vitals:   11/26/17 1942 11/26/17 2220 11/27/17 0615 11/27/17 0736  BP: (!) 110/54 112/60 (!) 143/83   Pulse:   (!) 110   Resp:  18 18   Temp:  98.3 F (36.8 C) 98.6  F (37 C)   TempSrc:   Oral   SpO2:  95% 100% 98%  Weight:      Height:        Wt Readings from Last 3 Encounters:  11/26/17 65.7 kg (144 lb 13.5 oz)  11/18/17 68 kg (150 lb)  11/08/17 68 kg (150 lb)     Intake/Output Summary (Last 24 hours) at 11/27/2017 1114 Last data filed at 11/27/2017 0600 Gross per 24 hour  Intake 492 ml  Output 2503 ml  Net -2011 ml     Physical Exam General: Elderly female not in distress HEENT: Moist mucosa, supple neck Chest: Clear bilaterally, right-sided permacath CVS: S1 and S2 irregular, no murmurs GI: Soft, nondistended, nontender Musculoskeletal: Warm, improved bilateral leg swelling     Data Review:    CBC Recent Labs  Lab 11/24/17 1053 11/25/17 0547 11/26/17 0537 11/27/17 0208  WBC 10.1 8.0 8.9 9.1  HGB 12.1 11.4* 11.5* 12.0  HCT 40.3 37.9 37.3 38.7  PLT 238 257 297 273  MCV 97.6 95.9 94.7 94.9  MCH 29.3 28.9 29.2 29.4  MCHC 30.0 30.1 30.8 31.0  RDW 14.5 14.3 14.2 14.2  LYMPHSABS 0.9  --   --   --   MONOABS 0.8  --   --   --   EOSABS 0.1  --   --   --   BASOSABS 0.0  --   --   --     Chemistries  Recent Labs  Lab 11/24/17 1053 11/25/17 0547 11/26/17 0537  NA 139 140 140  K 3.6 3.5 3.6  CL 99* 102 102  CO2 '25 24 24  ' GLUCOSE 120* 82 99  BUN 20 27* 34*  CREATININE 4.63* 5.48* 6.88*  CALCIUM 10.2 9.5 9.7  AST 16  --   --   ALT 13*  --   --  ALKPHOS 84  --   --   BILITOT 0.7  --   --    ------------------------------------------------------------------------------------------------------------------ No results for input(s): CHOL, HDL, LDLCALC, TRIG, CHOLHDL, LDLDIRECT in the last 72 hours.  No results found for: HGBA1C ------------------------------------------------------------------------------------------------------------------ No results for input(s): TSH, T4TOTAL, T3FREE, THYROIDAB in the last 72 hours.  Invalid input(s):  FREET3 ------------------------------------------------------------------------------------------------------------------ No results for input(s): VITAMINB12, FOLATE, FERRITIN, TIBC, IRON, RETICCTPCT in the last 72 hours.  Coagulation profile Recent Labs  Lab 11/24/17 1053 11/27/17 0208  INR 0.97 1.05    No results for input(s): DDIMER in the last 72 hours.  Cardiac Enzymes No results for input(s): CKMB, TROPONINI, MYOGLOBIN in the last 168 hours.  Invalid input(s): CK ------------------------------------------------------------------------------------------------------------------    Component Value Date/Time   BNP 1,962.0 (H) 11/01/2016 2446    Inpatient Medications  Scheduled Meds: . diltiazem  60 mg Oral Q8H  . metoprolol tartrate  25 mg Oral BID  . multivitamin  1 tablet Oral Daily  . pantoprazole  40 mg Oral Daily  . senna  1 tablet Oral BID  . sevelamer carbonate  800 mg Oral TID WC  . sodium chloride flush  3 mL Intravenous Q12H  . warfarin  5 mg Oral Once  . Warfarin - Pharmacist Dosing Inpatient   Does not apply q1800   Continuous Infusions: . sodium chloride    . sodium chloride    . sodium chloride    . heparin 850 Units/hr (11/26/17 1841)   PRN Meds:.sodium chloride, sodium chloride, sodium chloride, acetaminophen **OR** acetaminophen, albuterol, ALPRAZolam, alteplase, diltiazem, heparin, HYDROcodone-acetaminophen, hydrOXYzine, morphine injection, ondansetron **OR** ondansetron (ZOFRAN) IV, polyethylene glycol, promethazine, sodium chloride flush, traZODone  Micro Results No results found for this or any previous visit (from the past 240 hour(s)).  Radiology Reports Dg Ankle Complete Left  Result Date: 11/24/2017 CLINICAL DATA:  Pain and swelling for 3 months EXAM: LEFT ANKLE COMPLETE - 3+ VIEW COMPARISON:  None FINDINGS: Diffuse soft tissue swelling. Osseous demineralization. Joint spaces preserved. No acute fracture, dislocation, or bone destruction.  Scattered soft tissue calcifications at lower leg including phleboliths and vascular calcifications extending into foot. IMPRESSION: Significant soft tissue swelling and osseous demineralization without acute bony abnormalities. Electronically Signed   By: Lavonia Dana M.D.   On: 11/24/2017 13:19   Dg Ankle Complete Right  Result Date: 11/24/2017 CLINICAL DATA:  Pain and swelling for 3 months EXAM: RIGHT ANKLE - COMPLETE 3+ VIEW COMPARISON:  None FINDINGS: Marked soft tissue swelling. Diffuse osseous demineralization. Joint spaces preserved. No acute fracture, dislocation, or bone destruction. Small vessel vascular calcifications extending into foot. IMPRESSION: Significant soft tissue swelling and osseous demineralization without acute bony abnormality. Electronically Signed   By: Lavonia Dana M.D.   On: 11/24/2017 13:19   US Venous Img Lower Bilateral  Result Date: 11/24/2017 CLINICAL DATA:  Pain and swelling in both lower extremities for 3 months EXAM: BILATERAL LOWER EXTREMITY VENOUS DOPPLER ULTRASOUND TECHNIQUE: Gray-scale sonography with graded compression, as well as color Doppler and duplex ultrasound were performed to evaluate the lower extremity deep venous systems from the level of the common femoral vein and including the common femoral, femoral, profunda femoral, popliteal and calf veins including the posterior tibial, peroneal and gastrocnemius veins when visible. The superficial great saphenous vein was also interrogated. Spectral Doppler was utilized to evaluate flow at rest and with distal augmentation maneuvers in the common femoral, femoral and popliteal veins. COMPARISON:  11/06/2016 FINDINGS: RIGHT LOWER EXTREMITY Common Femoral Vein: Nonocclusive hypoechoic  thrombus with impaired compressibility and diminished flow Saphenofemoral Junction: No evidence of thrombus. Normal compressibility and flow on color Doppler imaging. Profunda Femoral Vein: No evidence of thrombus. Normal  compressibility and flow on color Doppler imaging. Femoral Vein: Nonocclusive hypoechoic thrombus with impaired compressibility and diminished spontaneous venous flow Popliteal Vein: Nonocclusive hypoechoic thrombus with impaired compressibility and diminished spontaneous venous flow Calf Veins: Occlusive thrombus of the perineal and posterior tibial veins Superficial Great Saphenous Vein: No evidence of thrombus. Normal compressibility. Venous Reflux:  None. Other Findings: Baker cyst RIGHT popliteal fossa 5.2 x 1.9 x 3.8 cm. Scattered subcutaneous edema. LEFT LOWER EXTREMITY Common Femoral Vein: Occlusive hypoechoic thrombus with impaired compressibility and absent spontaneous venous flow Saphenofemoral Junction: Thrombus at the saphenofemoral junction. Profunda Femoral Vein: No evidence of thrombus. Normal compressibility and flow on color Doppler imaging. Femoral Vein: Non occlusive hypoechoic thrombus proximally with occlusive thrombus distally Popliteal Vein: No evidence of thrombus. Normal compressibility, respiratory phasicity and response to augmentation. Calf Veins: No evidence of thrombus. Normal compressibility and flow on color Doppler imaging. Superficial Great Saphenous Vein: No evidence of thrombus in greater saphenous vein. Hypoechoic thrombus seen within lesser saphenous vein with impaired compressibility. Venous Reflux:  None. Other Findings:  Scattered subcutaneous edema IMPRESSION: Positive exam for presence of acute appearing deep venous thrombosis in the lower extremities bilaterally involving BILATERAL common femoral veins, BILATERAL femoral veins, and RIGHT popliteal vein into RIGHT calf veins. Baker cyst RIGHT popliteal fossa. Electronically Signed   By: Lavonia Dana M.D.   On: 11/24/2017 13:12   Dg Foot Complete Left  Result Date: 11/24/2017 CLINICAL DATA:  Pain and swelling for 3 months EXAM: LEFT FOOT - COMPLETE 3+ VIEW COMPARISON:  None. FINDINGS: Significant diffuse soft tissue  swelling. Osseous demineralization. Joint spaces preserved. Small vessel vascular calcifications. Calcaneal spurring. No acute fracture, dislocation or bone destruction. IMPRESSION: Significant soft tissue swelling and osseous demineralization without acute bony abnormalities. Calcaneal spurring. Electronically Signed   By: Lavonia Dana M.D.   On: 11/24/2017 13:17   Dg Foot Complete Right  Result Date: 11/24/2017 CLINICAL DATA:  Pain and swelling for 3 months EXAM: RIGHT FOOT COMPLETE - 3+ VIEW COMPARISON:  None FINDINGS: Significant diffuse soft tissue edema. Osseous demineralization. Joint spaces preserved. No acute fracture, dislocation, or bone destruction. Small plantar calcaneal spur. Small vessel vascular calcifications. IMPRESSION: Osseous demineralization and significant soft tissue swelling without acute bony abnormalities. Calcaneal spur. Electronically Signed   By: Lavonia Dana M.D.   On: 11/24/2017 13:15    Time Spent in minutes  25   Alicya Bena M.D on 11/27/2017 at 11:14 AM  Between 7am to 7pm - Pager - [phone]  After 7pm go to [email or web address] - password [facility]  [facility] -  Office  [phone]

## 2017-11-27 NOTE — Progress Notes (Signed)
ANTICOAGULATION CONSULT NOTE - follow up  Pharmacy Consult for HEPARIN bridge with coumadin Indication: VTE treatment  Allergies  Allergen Reactions  . Coumadin [Warfarin Sodium]     Caused GIB   Patient Measurements: Height: 5\' 4"  (162.6 cm) Weight: 144 lb 13.5 oz (65.7 kg) IBW/kg (Calculated) : 54.7 HEPARIN DW (KG): 67.6  Vital Signs: Temp: 98.6 F (37 C) (01/30 0615) Temp Source: Oral (01/30 0615) BP: 143/83 (01/30 0615) Pulse Rate: 110 (01/30 0615)  Labs: Recent Labs    11/24/17 1053  11/25/17 0547  11/26/17 0532 11/26/17 0537 11/26/17 1639 11/27/17 0208  HGB 12.1  --  11.4*  --   --  11.5*  --  12.0  HCT 40.3  --  37.9  --   --  37.3  --  38.7  PLT 238  --  257  --   --  297  --  273  APTT 34  --   --   --   --   --   --   --   LABPROT 12.8  --   --   --   --   --   --  13.6  INR 0.97  --   --   --   --   --   --  1.05  HEPARINUNFRC  --    < > 1.02*   < > 0.18*  --  0.91* 0.54  CREATININE 4.63*  --  5.48*  --   --  6.88*  --   --    < > = values in this interval not displayed.   Estimated Creatinine Clearance: 5.4 mL/min (A) (by C-G formula based on SCr of 6.88 mg/dL (H)).  Medical History: Past Medical History:  Diagnosis Date  . Anemia 02/11/2012  . Anxiety   . Aortic stenosis    mild AS 10/2016 echo  . Arthritis    arthritis in neck  . Atrial fibrillation (HCC)   . CHF (congestive heart failure) (HCC)   . Chronic kidney disease    hemodialysis Tu/Th/Sa  . Dyspnea    on exertion  . GERD (gastroesophageal reflux disease)   . GI bleed 01/2017   due to gastric ulcer, taking NSAIDs while on coumadin  . H/O metabolic acidosis   . Headache   . Hyperlipidemia   . Hyperparathyroidism, secondary (HCC)   . Hypertension    Dr. Regino Schultze, [place], [place]  . Peptic ulcer   . Peripheral vascular disease (HCC)   . Vitamin D deficiency    Medications:  Medications Prior to Admission  Medication Sig Dispense Refill Last Dose  . ALPRAZolam (XANAX) 0.5 MG  tablet Take 0.5 mg by mouth 3 (three) times daily as needed for anxiety.   11/22/2017 at Unknown time  . diltiazem (CARDIZEM) 60 MG tablet Take 60 mg by mouth daily.   11/23/2017 at Unknown time  . doxycycline (VIBRA-TABS) 100 MG tablet Take 1 tablet by mouth 2 (two) times daily.   11/24/2017 at Unknown time  . HYDROcodone-acetaminophen (NORCO) 10-325 MG tablet Take 1 tablet by mouth 4 (four) times daily as needed for moderate pain.    11/24/2017 at Unknown time  . hydrOXYzine (ATARAX/VISTARIL) 25 MG tablet Take 1 tablet by mouth 4 (four) times daily as needed for itching.   11/24/2017 at Unknown time  . metoprolol tartrate (LOPRESSOR) 25 MG tablet Take 25 mg by mouth 2 (two) times daily.   11/24/2017 at 0730  . multivitamin (RENA-VIT) TABS tablet Take 1  tablet by mouth daily.   11/23/2017 at Unknown time  . promethazine (PHENERGAN) 25 MG tablet Take 25 mg by mouth every 6 (six) hours as needed for nausea or vomiting.   unknown  . sevelamer (RENAGEL) 800 MG tablet Take 1,600 mg by mouth 3 (three) times daily with meals.   11/23/2017 at Unknown time    Assessment: Per EMS, pt received dialysis yesterday and reports leg pain ever since. Pt reports history of same with swelling and reports today pain is more intense. Pt reports left foot feels numb since waking this am. Pt not on anticoagulants PTA.  Heparin level is below goal this AM. Now HL slightly elevated and was held for endoscopy this afternoon 1/29. GI performed endoscopy that showed normal esophagus and diffuse moderate inflammation and erythema in the stomach. Now plan to start coumadin and will bridge with heparin. Hemoglobin is stable.  Heparin level therapeutic this AM. Coumadin started and 5mg  daily was dose patient has taken in the past. Will give additional 5mg  today and trend INR.  Goal of Therapy:  INR 2-3 Heparin level 0.3-0.7 units/ml Monitor platelets by anticoagulation protocol: Yes   Plan:   Coumadin 5mg  po x 1 today  PT-INR  daily  Continue Heparin infusion at 850 units/hr  Heparin level  daily  CBC daily while on Heparin  Elder Cyphers, BS Loura Back, [place] Clinical Pharmacist Pager [phone] 11/27/2017 7:38 AM

## 2017-11-27 NOTE — Progress Notes (Addendum)
Subjective: Interval History: Patient continued to feel better.  She denies any leg pain.  Her appetite has improved and she did not have any nausea or vomiting.  Objective: Vital signs in last 24 hours: Temp:  [98 F (36.7 C)-98.6 F (37 C)] 98.6 F (37 C) (01/30 0615) Pulse Rate:  [43-149] 110 (01/30 0615) Resp:  [11-27] 18 (01/30 0615) BP: (93-165)/(53-100) 143/83 (01/30 0615) SpO2:  [95 %-100 %] 98 % (01/30 0736) Weight:  [65.7 kg (144 lb 13.5 oz)-68.1 kg (150 lb 2.1 oz)] 65.7 kg (144 lb 13.5 oz) (01/29 1340) Weight change:   Intake/Output from previous day: 01/29 0701 - 01/30 0700 In: 735 [P.O.:480; I.V.:255] Out: 2503  Intake/Output this shift: No intake/output data recorded.  Generally patient is alert and in no apparent distress Chest is clear to auscultation Heart exam revealed iregular rate and rhythm no murmur Extremities: She has trace to 1+ edema bilaterally.  Lab Results: Recent Labs    11/26/17 0537 11/27/17 0208  WBC 8.9 9.1  HGB 11.5* 12.0  HCT 37.3 38.7  PLT 297 273   BMET:  Recent Labs    11/25/17 0547 11/26/17 0537  NA 140 140  K 3.5 3.6  CL 102 102  CO2 24 24  GLUCOSE 82 99  BUN 27* 34*  CREATININE 5.48* 6.88*  CALCIUM 9.5 9.7   No results for input(s): PTH in the last 72 hours. Iron Studies: No results for input(s): IRON, TIBC, TRANSFERRIN, FERRITIN in the last 72 hours.  Studies/Results: No results found.  I have reviewed the patient's current medications.  Assessment/Plan: 1] history of DVT/PE: Presently patient on heparin . 2] end-stage renal disease: She states she is status post hemodialysis yesterday.  Patient does not have any uremic signs and symptoms. 3] anemia: Her hemoglobin has gone up above target goal. 4] bone and mineral disorder: Her calcium and phosphorus is in range 5] fluid management:   When able to remove about 2-1/2 L yesterday..  Patient is presently asymptomatic. 6] atrial fibrillation: Her heart rate is  controlled Plan: Patient does require dialysis today.  Her next dialysis will be tomorrow. 2] we will try to remove about 21/2 L if her systolic blood pressure remains above 90 3] we will check a renal panel in the morning. 4] we will continue to hold Epogen.  LOS: 3 days   Jourdain Guay S 11/27/2017,8:46 AM

## 2017-11-27 NOTE — Progress Notes (Signed)
*  PRELIMINARY RESULTS* Echocardiogram 2D Echocardiogram has been performed.  Stacey Drain 11/27/2017, 11:11 AM

## 2017-11-28 ENCOUNTER — Encounter (HOSPITAL_COMMUNITY): Payer: Self-pay | Admitting: Gastroenterology

## 2017-11-28 DIAGNOSIS — N186 End stage renal disease: Secondary | ICD-10-CM | POA: Diagnosis not present

## 2017-11-28 DIAGNOSIS — Z992 Dependence on renal dialysis: Secondary | ICD-10-CM | POA: Diagnosis not present

## 2017-11-28 LAB — RENAL FUNCTION PANEL
Albumin: 2.5 g/dL — ABNORMAL LOW (ref 3.5–5.0)
Anion gap: 13 (ref 5–15)
BUN: 30 mg/dL — ABNORMAL HIGH (ref 6–20)
CHLORIDE: 101 mmol/L (ref 101–111)
CO2: 26 mmol/L (ref 22–32)
Calcium: 9.7 mg/dL (ref 8.9–10.3)
Creatinine, Ser: 6 mg/dL — ABNORMAL HIGH (ref 0.44–1.00)
GFR, EST AFRICAN AMERICAN: 7 mL/min — AB (ref >60)
GFR, EST NON AFRICAN AMERICAN: 6 mL/min — AB (ref >60)
Glucose, Bld: 85 mg/dL (ref 65–99)
POTASSIUM: 3.7 mmol/L (ref 3.5–5.1)
Phosphorus: 5.7 mg/dL — ABNORMAL HIGH (ref 2.5–4.6)
Sodium: 140 mmol/L (ref 135–145)

## 2017-11-28 LAB — CBC
HCT: 37.3 % (ref 36.0–46.0)
HEMOGLOBIN: 11.6 g/dL — AB (ref 12.0–15.0)
MCH: 29.3 pg (ref 26.0–34.0)
MCHC: 31.1 g/dL (ref 30.0–36.0)
MCV: 94.2 fL (ref 78.0–100.0)
Platelets: 281 10*3/uL (ref 150–400)
RBC: 3.96 MIL/uL (ref 3.87–5.11)
RDW: 14 % (ref 11.5–15.5)
WBC: 8.8 10*3/uL (ref 4.0–10.5)

## 2017-11-28 LAB — HEPARIN LEVEL (UNFRACTIONATED): Heparin Unfractionated: 0.39 IU/mL (ref 0.30–0.70)

## 2017-11-28 LAB — PROTIME-INR
INR: 1.07
PROTHROMBIN TIME: 13.8 s (ref 11.4–15.2)

## 2017-11-28 MED ORDER — WARFARIN SODIUM 7.5 MG PO TABS
7.5000 mg | ORAL_TABLET | Freq: Once | ORAL | Status: AC
  Administered 2017-11-28: 7.5 mg via ORAL
  Filled 2017-11-28 (×2): qty 1

## 2017-11-28 MED ORDER — METOPROLOL TARTRATE 50 MG PO TABS
50.0000 mg | ORAL_TABLET | Freq: Two times a day (BID) | ORAL | Status: DC
  Administered 2017-11-28 – 2017-12-02 (×8): 50 mg via ORAL
  Filled 2017-11-28 (×9): qty 1

## 2017-11-28 MED ORDER — DILTIAZEM HCL 60 MG PO TABS
60.0000 mg | ORAL_TABLET | Freq: Four times a day (QID) | ORAL | Status: DC
  Administered 2017-11-28 – 2017-12-03 (×17): 60 mg via ORAL
  Filled 2017-11-28 (×17): qty 1

## 2017-11-28 NOTE — Progress Notes (Addendum)
PROGRESS NOTE                                                                                                                                                                                                             Patient Demographics:    Patricia Ayala, is a 84 y.o. female, DOB - July 17, 1929, ZOX:[id number]  Admit date - 11/24/2017   Admitting Physician Courage Mariea Clonts, MD  Outpatient Primary MD for the patient is [name], MD  LOS - 4  Outpatient Specialists: Dr. Imogene Burn (vascular surgery)  Chief Complaint  Patient presents with  . Leg Pain       Brief Narrative   84 year old female with ESRD on hemodialysis (T, T, S), A. fib and prior history of DVT off Coumadin since April 2018 due to life-threatening GI bleed from gastric ulcers while on anticoagulation and also taking NSAIDs (she required multiple transfusions and was also placed on ventilator transiently) , anemia of chronic kidney disease who presented with bilateral lower extremity swelling with pain for almost 2 months duration worsened in the past several days. In the ED she was found recently DVT of both lower extremities extending from common femoral vein involving the femoral veins, popliteal and calf veins. ED physician discussed with on-call vascular surgeon Dr. Darrick Penna who recommended no intervention at this time and monitor her on anticoagulation.   Subjective:     Assessment  & Plan :    Principal Problem:   Extensive DVT of lower extremity, bilateral (HCC) On IV heparin drip.  Started on Coumadin on 1/29.  H&H stable.   EGD done  negative for ulcers and shows gastritis, biopsy sent. Given extent of DVT and underlying A. Fib, patient will need lifelong anticoagulation if tolerated.  Active Problems:   ESRD (end stage renal disease) (HCC) On hemodialysis Tuesdays, Thursdays and Saturdays. Renal following.     chronic  Atrial fibrillation with  RVR (HCC) Heart rate elevated. Increased both Cardizem and Metoprolol dose. 2-D echo with normal EF and no  Wall motion abnormality.    H/o Chronic gastric ulcer with hemorrhage 01/2017 EGD with gastritis, no ulcer. Started coumadin  Anemia of chronic disease Stable.  Left basic vein transposition with imminent thrombosis  patient had left radial artery cannulation and shuntogram done on 1/21. Showed occlusion of the fistula 2-3 cm distal to  the anastomosis with a subtotal occlusion of the anastomosis. Recommends new permanent access to be placed in left arm. Given her acute DVT and need for anticoagulation I think this will have to be postponed for at least 3 months while on active anticoagulation. Continue dialysis with permacath. I will discuss with Dr. Imogene Burn.   Goals of care Palliative care following. At present wants full scope of treatment.    Code Status : Full code  Family Communication  : Husband at bedside  Disposition Plan  : Home once INR therapeutic on Coumadin, Likely early next week  Barriers For Discharge : Active symptoms  Consults  :   Nephrology GI  Procedures  :  Doppler lower extremity EGD  DVT Prophylaxis  :  IV heparin/ couamdin  Lab Results  Component Value Date   PLT 281 11/28/2017    Antibiotics  :    Anti-infectives (From admission, onward)   None        Objective:   Vitals:   11/27/17 0736 11/27/17 1301 11/27/17 2145 11/28/17 0500  BP:  123/76 130/69 130/83  Pulse:  (!) 53 77 82  Resp:  16 16 16   Temp:  98.5 F (36.9 C) 98.4 F (36.9 C) 98.1 F (36.7 C)  TempSrc:  Oral Oral Oral  SpO2: 98% 99% 100% 100%  Weight:      Height:        Wt Readings from Last 3 Encounters:  11/26/17 65.7 kg (144 lb 13.5 oz)  11/18/17 68 kg (150 lb)  11/08/17 68 kg (150 lb)     Intake/Output Summary (Last 24 hours) at 11/28/2017 1015 Last data filed at 11/28/2017 0547 Gross per 24 hour  Intake 202.16 ml  Output -  Net 202.16 ml      Physical Exam Gen.: Elderly female not in Distress HEENT: Moist mucosa, supple neck Chest: Clear to auscultation bilaterally right-sided permacath CVS:S1 and S2 Irregular and tachycardic, no murmurs GI: Soft, nondistended, Nontender Musculoskeletal: Warm, no edema      Data Review:    CBC Recent Labs  Lab 11/24/17 1053 11/25/17 0547 11/26/17 0537 11/27/17 0208 11/28/17 0617  WBC 10.1 8.0 8.9 9.1 8.8  HGB 12.1 11.4* 11.5* 12.0 11.6*  HCT 40.3 37.9 37.3 38.7 37.3  PLT 238 257 297 273 281  MCV 97.6 95.9 94.7 94.9 94.2  MCH 29.3 28.9 29.2 29.4 29.3  MCHC 30.0 30.1 30.8 31.0 31.1  RDW 14.5 14.3 14.2 14.2 14.0  LYMPHSABS 0.9  --   --   --   --   MONOABS 0.8  --   --   --   --   EOSABS 0.1  --   --   --   --   BASOSABS 0.0  --   --   --   --     Chemistries  Recent Labs  Lab 11/24/17 1053 11/25/17 0547 11/26/17 0537 11/28/17 0617  NA 139 140 140 140  K 3.6 3.5 3.6 3.7  CL 99* 102 102 101  CO2 25 24 24 26   GLUCOSE 120* 82 99 85  BUN 20 27* 34* 30*  CREATININE 4.63* 5.48* 6.88* 6.00*  CALCIUM 10.2 9.5 9.7 9.7  AST 16  --   --   --   ALT 13*  --   --   --   ALKPHOS 84  --   --   --   BILITOT 0.7  --   --   --    ------------------------------------------------------------------------------------------------------------------  No results for input(s): CHOL, HDL, LDLCALC, TRIG, CHOLHDL, LDLDIRECT in the last 72 hours.  No results found for: HGBA1C ------------------------------------------------------------------------------------------------------------------ No results for input(s): TSH, T4TOTAL, T3FREE, THYROIDAB in the last 72 hours.  Invalid input(s): FREET3 ------------------------------------------------------------------------------------------------------------------ No results for input(s): VITAMINB12, FOLATE, FERRITIN, TIBC, IRON, RETICCTPCT in the last 72 hours.  Coagulation profile Recent Labs  Lab 11/24/17 1053 11/27/17 0208  11/28/17 0617  INR 0.97 1.05 1.07    No results for input(s): DDIMER in the last 72 hours.  Cardiac Enzymes No results for input(s): CKMB, TROPONINI, MYOGLOBIN in the last 168 hours.  Invalid input(s): CK ------------------------------------------------------------------------------------------------------------------    Component Value Date/Time   BNP 1,962.0 (H) 11/01/2016 1914    Inpatient Medications  Scheduled Meds: . diltiazem  60 mg Oral Q6H  . metoprolol tartrate  50 mg Oral BID  . multivitamin  1 tablet Oral Daily  . pantoprazole  40 mg Oral Daily  . senna  1 tablet Oral BID  . sevelamer carbonate  800 mg Oral TID WC  . sodium chloride flush  3 mL Intravenous Q12H  . warfarin  7.5 mg Oral Once  . Warfarin - Pharmacist Dosing Inpatient   Does not apply q1800   Continuous Infusions: . sodium chloride    . sodium chloride    . sodium chloride    . heparin 850 Units/hr (11/27/17 2132)   PRN Meds:.sodium chloride, sodium chloride, sodium chloride, acetaminophen **OR** acetaminophen, albuterol, ALPRAZolam, alteplase, diltiazem, heparin, HYDROcodone-acetaminophen, hydrOXYzine, morphine injection, ondansetron **OR** ondansetron (ZOFRAN) IV, polyethylene glycol, promethazine, sodium chloride flush, traZODone  Micro Results No results found for this or any previous visit (from the past 240 hour(s)).  Radiology Reports Dg Ankle Complete Left  Result Date: 11/24/2017 CLINICAL DATA:  Pain and swelling for 3 months EXAM: LEFT ANKLE COMPLETE - 3+ VIEW COMPARISON:  None FINDINGS: Diffuse soft tissue swelling. Osseous demineralization. Joint spaces preserved. No acute fracture, dislocation, or bone destruction. Scattered soft tissue calcifications at lower leg including phleboliths and vascular calcifications extending into foot. IMPRESSION: Significant soft tissue swelling and osseous demineralization without acute bony abnormalities. Electronically Signed   By: Ulyses Southward M.D.    On: 11/24/2017 13:19   Dg Ankle Complete Right  Result Date: 11/24/2017 CLINICAL DATA:  Pain and swelling for 3 months EXAM: RIGHT ANKLE - COMPLETE 3+ VIEW COMPARISON:  None FINDINGS: Marked soft tissue swelling. Diffuse osseous demineralization. Joint spaces preserved. No acute fracture, dislocation, or bone destruction. Small vessel vascular calcifications extending into foot. IMPRESSION: Significant soft tissue swelling and osseous demineralization without acute bony abnormality. Electronically Signed   By: Ulyses Southward M.D.   On: 11/24/2017 13:19   US Venous Img Lower Bilateral  Result Date: 11/24/2017 CLINICAL DATA:  Pain and swelling in both lower extremities for 3 months EXAM: BILATERAL LOWER EXTREMITY VENOUS DOPPLER ULTRASOUND TECHNIQUE: Gray-scale sonography with graded compression, as well as color Doppler and duplex ultrasound were performed to evaluate the lower extremity deep venous systems from the level of the common femoral vein and including the common femoral, femoral, profunda femoral, popliteal and calf veins including the posterior tibial, peroneal and gastrocnemius veins when visible. The superficial great saphenous vein was also interrogated. Spectral Doppler was utilized to evaluate flow at rest and with distal augmentation maneuvers in the common femoral, femoral and popliteal veins. COMPARISON:  11/06/2016 FINDINGS: RIGHT LOWER EXTREMITY Common Femoral Vein: Nonocclusive hypoechoic thrombus with impaired compressibility and diminished flow Saphenofemoral Junction: No evidence of thrombus. Normal compressibility and flow  on color Doppler imaging. Profunda Femoral Vein: No evidence of thrombus. Normal compressibility and flow on color Doppler imaging. Femoral Vein: Nonocclusive hypoechoic thrombus with impaired compressibility and diminished spontaneous venous flow Popliteal Vein: Nonocclusive hypoechoic thrombus with impaired compressibility and diminished spontaneous venous flow  Calf Veins: Occlusive thrombus of the perineal and posterior tibial veins Superficial Great Saphenous Vein: No evidence of thrombus. Normal compressibility. Venous Reflux:  None. Other Findings: Baker cyst RIGHT popliteal fossa 5.2 x 1.9 x 3.8 cm. Scattered subcutaneous edema. LEFT LOWER EXTREMITY Common Femoral Vein: Occlusive hypoechoic thrombus with impaired compressibility and absent spontaneous venous flow Saphenofemoral Junction: Thrombus at the saphenofemoral junction. Profunda Femoral Vein: No evidence of thrombus. Normal compressibility and flow on color Doppler imaging. Femoral Vein: Non occlusive hypoechoic thrombus proximally with occlusive thrombus distally Popliteal Vein: No evidence of thrombus. Normal compressibility, respiratory phasicity and response to augmentation. Calf Veins: No evidence of thrombus. Normal compressibility and flow on color Doppler imaging. Superficial Great Saphenous Vein: No evidence of thrombus in greater saphenous vein. Hypoechoic thrombus seen within lesser saphenous vein with impaired compressibility. Venous Reflux:  None. Other Findings:  Scattered subcutaneous edema IMPRESSION: Positive exam for presence of acute appearing deep venous thrombosis in the lower extremities bilaterally involving BILATERAL common femoral veins, BILATERAL femoral veins, and RIGHT popliteal vein into RIGHT calf veins. Baker cyst RIGHT popliteal fossa. Electronically Signed   By: Ulyses Southward M.D.   On: 11/24/2017 13:12   Dg Foot Complete Left  Result Date: 11/24/2017 CLINICAL DATA:  Pain and swelling for 3 months EXAM: LEFT FOOT - COMPLETE 3+ VIEW COMPARISON:  None. FINDINGS: Significant diffuse soft tissue swelling. Osseous demineralization. Joint spaces preserved. Small vessel vascular calcifications. Calcaneal spurring. No acute fracture, dislocation or bone destruction. IMPRESSION: Significant soft tissue swelling and osseous demineralization without acute bony abnormalities. Calcaneal  spurring. Electronically Signed   By: Ulyses Southward M.D.   On: 11/24/2017 13:17   Dg Foot Complete Right  Result Date: 11/24/2017 CLINICAL DATA:  Pain and swelling for 3 months EXAM: RIGHT FOOT COMPLETE - 3+ VIEW COMPARISON:  None FINDINGS: Significant diffuse soft tissue edema. Osseous demineralization. Joint spaces preserved. No acute fracture, dislocation, or bone destruction. Small plantar calcaneal spur. Small vessel vascular calcifications. IMPRESSION: Osseous demineralization and significant soft tissue swelling without acute bony abnormalities. Calcaneal spur. Electronically Signed   By: Ulyses Southward M.D.   On: 11/24/2017 13:15    Time Spent in minutes  25   Latorria Zeoli M.D on 11/28/2017 at 10:15 AM  Between 7am to 7pm - Pager - [phone]  After 7pm go to [email or web address] - password [facility]  [facility] -  Office  [phone]

## 2017-11-28 NOTE — Procedures (Signed)
     HEMODIALYSIS TREATMENT NOTE:   4 hour heparin-free dialysis completed via right IJ tunneled PC. Exit site unremarkable. Goal met: 2.5L removed without interruption in ultrafiltration. All blood was returned.  Rockwell Alexandria, RN, CDN

## 2017-11-28 NOTE — Progress Notes (Signed)
PT's husband, Ivar Drape, is aware.

## 2017-11-28 NOTE — Progress Notes (Addendum)
ANTICOAGULATION CONSULT NOTE - follow up  Pharmacy Consult for HEPARIN bridge with coumadin Indication: VTE treatment  Allergies  Allergen Reactions  . Coumadin [Warfarin Sodium]     Caused GIB   Patient Measurements: Height: 5\' 4"  (162.6 cm) Weight: 144 lb 13.5 oz (65.7 kg) IBW/kg (Calculated) : 54.7 HEPARIN DW (KG): 67.6  Vital Signs: Temp: 98.1 F (36.7 C) (01/31 0500) Temp Source: Oral (01/31 0500) BP: 130/83 (01/31 0500) Pulse Rate: 82 (01/31 0500)  Labs: Recent Labs    11/26/17 0537 11/26/17 1639 11/27/17 0208 11/28/17 0617  HGB 11.5*  --  12.0 11.6*  HCT 37.3  --  38.7 37.3  PLT 297  --  273 281  LABPROT  --   --  13.6 13.8  INR  --   --  1.05 1.07  HEPARINUNFRC  --  0.91* 0.54 0.39  CREATININE 6.88*  --   --  6.00*   Estimated Creatinine Clearance: 6.2 mL/min (A) (by C-G formula based on SCr of 6 mg/dL (H)).  Medical History: Past Medical History:  Diagnosis Date  . Anemia 02/11/2012  . Anxiety   . Aortic stenosis    mild AS 10/2016 echo  . Arthritis    arthritis in neck  . Atrial fibrillation (HCC)   . CHF (congestive heart failure) (HCC)   . Chronic kidney disease    hemodialysis Tu/Th/Sa  . Dyspnea    on exertion  . GERD (gastroesophageal reflux disease)   . GI bleed 01/2017   due to gastric ulcer, taking NSAIDs while on coumadin  . H/O metabolic acidosis   . Headache   . Hyperlipidemia   . Hyperparathyroidism, secondary (HCC)   . Hypertension    Dr. Regino Schultze, [place], [place]  . Peptic ulcer   . Peripheral vascular disease (HCC)   . Vitamin D deficiency    Medications:  Medications Prior to Admission  Medication Sig Dispense Refill Last Dose  . ALPRAZolam (XANAX) 0.5 MG tablet Take 0.5 mg by mouth 3 (three) times daily as needed for anxiety.   11/22/2017 at Unknown time  . diltiazem (CARDIZEM) 60 MG tablet Take 60 mg by mouth daily.   11/23/2017 at Unknown time  . doxycycline (VIBRA-TABS) 100 MG tablet Take 1 tablet by mouth 2 (two) times  daily.   11/24/2017 at Unknown time  . HYDROcodone-acetaminophen (NORCO) 10-325 MG tablet Take 1 tablet by mouth 4 (four) times daily as needed for moderate pain.    11/24/2017 at Unknown time  . hydrOXYzine (ATARAX/VISTARIL) 25 MG tablet Take 1 tablet by mouth 4 (four) times daily as needed for itching.   11/24/2017 at Unknown time  . metoprolol tartrate (LOPRESSOR) 25 MG tablet Take 25 mg by mouth 2 (two) times daily.   11/24/2017 at 0730  . multivitamin (RENA-VIT) TABS tablet Take 1 tablet by mouth daily.   11/23/2017 at Unknown time  . promethazine (PHENERGAN) 25 MG tablet Take 25 mg by mouth every 6 (six) hours as needed for nausea or vomiting.   unknown  . sevelamer (RENAGEL) 800 MG tablet Take 1,600 mg by mouth 3 (three) times daily with meals.   11/23/2017 at Unknown time    Assessment: Per EMS, pt received dialysis yesterday and reports leg pain ever since. Pt reports history of same with swelling and reports today pain is more intense. Pt reports left foot feels numb since waking this am. Pt not on anticoagulants PTA.  Heparin level is below goal this AM. Now HL slightly elevated and  was held for endoscopy this afternoon 1/29. GI performed endoscopy that showed normal esophagus and diffuse moderate inflammation and erythema in the stomach. Now plan to start coumadin and will bridge with heparin. Hemoglobin is stable.  Heparin levelremains therapeutic this AM. Coumadin started and 5mg  daily was dose patient has taken in the past. Basically no change in INR 1.07, will give a little boost dose today.   Goal of Therapy:  INR 2-3 Heparin level 0.3-0.7 units/ml Monitor platelets by anticoagulation protocol: Yes   Plan:   Coumadin 7.5mg  po x 1 today  PT-INR daily  Continue Heparin infusion at 850 units/hr  Heparin level  daily  CBC daily while on Heparin  Elder Cyphers, BS Loura Back, [place] Clinical Pharmacist Pager [phone] 11/28/2017 7:54 AM

## 2017-11-28 NOTE — Progress Notes (Addendum)
Subjective: Interval History: Patient complains of some weakness of her leg otherwise feels okay.  Patient denies any difficulty breathing.  Objective: Vital signs in last 24 hours: Temp:  [98.1 F (36.7 C)-98.5 F (36.9 C)] 98.1 F (36.7 C) (01/31 0500) Pulse Rate:  [53-82] 82 (01/31 0500) Resp:  [16] 16 (01/31 0500) BP: (123-130)/(69-83) 130/83 (01/31 0500) SpO2:  [99 %-100 %] 100 % (01/31 0500) Weight change:   Intake/Output from previous day: 01/30 0701 - 01/31 0700 In: 202.2 [I.V.:202.2] Out: -  Intake/Output this shift: No intake/output data recorded.  Generally patient is alert and in no apparent distress Chest is clear to auscultation Heart exam revealed iregular rate and rhythm no murmur Extremities: She has trace  edema bilaterally.  Lab Results: Recent Labs    11/27/17 0208 11/28/17 0617  WBC 9.1 8.8  HGB 12.0 11.6*  HCT 38.7 37.3  PLT 273 281   BMET:  Recent Labs    11/26/17 0537 11/28/17 0617  NA 140 140  K 3.6 3.7  CL 102 101  CO2 24 26  GLUCOSE 99 85  BUN 34* 30*  CREATININE 6.88* 6.00*  CALCIUM 9.7 9.7   No results for input(s): PTH in the last 72 hours. Iron Studies: No results for input(s): IRON, TIBC, TRANSFERRIN, FERRITIN in the last 72 hours.  Studies/Results: No results found.  I have reviewed the patient's current medications.  Assessment/Plan: 1] history of DVT/PE: Presently patient on heparin and Coumadin 2] end-stage renal disease: She states she is status post hemodialysis on Tuesday and she is due for dialysis today.. 3] anemia: Her hemoglobin has returned to target goal. 4] bone and mineral disorder: Her calcium and phosphorus is in range 5] fluid management: Patient is presently asymptomatic. 6] atrial fibrillation: Her heart rate is controlled Plan: We will dialyze patient today  2] we will try to remove about 21/2 L if her systolic blood pressure remains above 90 3] we will check a renal panel in the morning.  LOS: 4  days   Palma Buster S 11/28/2017,8:30 AM

## 2017-11-29 ENCOUNTER — Ambulatory Visit: Payer: Medicare Other | Admitting: Vascular Surgery

## 2017-11-29 LAB — CBC
HCT: 38.1 % (ref 36.0–46.0)
HEMOGLOBIN: 11.9 g/dL — AB (ref 12.0–15.0)
MCH: 29.5 pg (ref 26.0–34.0)
MCHC: 31.2 g/dL (ref 30.0–36.0)
MCV: 94.5 fL (ref 78.0–100.0)
PLATELETS: 234 10*3/uL (ref 150–400)
RBC: 4.03 MIL/uL (ref 3.87–5.11)
RDW: 14 % (ref 11.5–15.5)
WBC: 7.5 10*3/uL (ref 4.0–10.5)

## 2017-11-29 LAB — HEPARIN LEVEL (UNFRACTIONATED)
Heparin Unfractionated: 0.27 IU/mL — ABNORMAL LOW (ref 0.30–0.70)
Heparin Unfractionated: 0.74 IU/mL — ABNORMAL HIGH (ref 0.30–0.70)

## 2017-11-29 LAB — PROTIME-INR
INR: 1.17
PROTHROMBIN TIME: 14.8 s (ref 11.4–15.2)

## 2017-11-29 MED ORDER — SEVELAMER CARBONATE 800 MG PO TABS
1600.0000 mg | ORAL_TABLET | Freq: Three times a day (TID) | ORAL | Status: DC
  Administered 2017-11-29 – 2017-12-03 (×11): 1600 mg via ORAL
  Filled 2017-11-29 (×11): qty 2

## 2017-11-29 MED ORDER — WARFARIN SODIUM 7.5 MG PO TABS
7.5000 mg | ORAL_TABLET | Freq: Once | ORAL | Status: AC
  Administered 2017-11-29: 7.5 mg via ORAL
  Filled 2017-11-29: qty 1

## 2017-11-29 NOTE — Evaluation (Signed)
Physical Therapy Evaluation Patient Details Name: Patricia Ayala MRN: [id number] DOB: [birth year] Today's Date: 11/29/2017   History of Present Illness   Patricia Ayala  is a 84 y.o. female past medical history relevant for ESRD with hemodialysis on Tuesdays Thursdays and Saturdays, Afib, HFpEF, chronic anemia of CKD and prior history of DVT with prior life-threatening GI bleed in 01/2017 while on anticoagulation and taking NSAIDs who now presents with bilateral lower extremity swelling and pain for over 2 months, Leg pains/swelling have worsened over the last several days.  Patient denies chest pains or pleuritic symptoms, no hypoxia.  No hemoptysis, no productive cough, no fever, no chills.  She does endorse dyspnea on exertion.    Clinical Impression  Patient limited for functional mobility as stated below secondary to BLE weakness, fatigue and fair/poor standing balance.  Patient tolerated sitting up in chair after therapy with visitor at bedside.  Patient will benefit from continued physical therapy in hospital and recommended venue below to increase strength, balance, endurance for safe ADLs and gait.     Follow Up Recommendations Home health PT;Supervision for mobility/OOB    Equipment Recommendations  None recommended by PT    Recommendations for Other Services       Precautions / Restrictions Precautions Precautions: Fall Restrictions Weight Bearing Restrictions: No      Mobility  Bed Mobility Overal bed mobility: Needs Assistance Bed Mobility: Supine to Sit     Supine to sit: Supervision        Transfers Overall transfer level: Needs assistance Equipment used: Rolling walker (2 wheeled) Transfers: Sit to/from [employer] Sit to Stand: Min guard Stand pivot transfers: Min guard          Ambulation/Gait Ambulation/Gait assistance: Min assist Ambulation Distance (Feet): 40 Feet Assistive device: Rolling walker (2 wheeled) Gait  Pattern/deviations: Decreased step length - right;Decreased step length - left;Decreased stride length   Gait velocity interpretation: Below normal speed for age/gender General Gait Details: demonstrates slow labored cadence without loss of balance, limited secondary to c/o fatigue  Stairs            Wheelchair Mobility    Modified Rankin (Stroke Patients Only)       Balance Overall balance assessment: Needs assistance Sitting-balance support: No upper extremity supported;Feet supported Sitting balance-Leahy Scale: Good     Standing balance support: Bilateral upper extremity supported;During functional activity Standing balance-Leahy Scale: Fair Standing balance comment: with RW                             Pertinent Vitals/Pain Pain Assessment: 0-10 Pain Score: 9  Pain Location: bilateral feet while lying, no c/o pain bilateral feet when walking Pain Descriptors / Indicators: Aching Pain Intervention(s): Limited activity within patient's tolerance;Monitored during session    Home Living Family/patient expects to be discharged to:: Private residence Living Arrangements: Spouse/significant other Available Help at Discharge: Family;Available 24 hours/day Type of Home: House Home Access: Level entry     Home Layout: One level Home Equipment: Walker - 2 wheels;Cane - single point;Shower seat      Prior Function Level of Independence: Independent with assistive device(s)         Comments: Ambulates with SPC for community distances     Hand Dominance        Extremity/Trunk Assessment   Upper Extremity Assessment Upper Extremity Assessment: Generalized weakness    Lower Extremity Assessment Lower Extremity Assessment: Generalized weakness  Cervical / Trunk Assessment Cervical / Trunk Assessment: Normal  Communication   Communication: No difficulties  Cognition Arousal/Alertness: Awake/alert Behavior During Therapy: WFL for tasks  assessed/performed Overall Cognitive Status: Within Functional Limits for tasks assessed                                        General Comments      Exercises     Assessment/Plan    PT Assessment Patient needs continued PT services  PT Problem List Decreased strength;Decreased activity tolerance;Decreased balance;Decreased mobility       PT Treatment Interventions Gait training;Functional mobility training;Therapeutic activities;Therapeutic exercise;Patient/family education    PT Goals (Current goals can be found in the Care Plan section)  Acute Rehab PT Goals Patient Stated Goal: return home with family to assist PT Goal Formulation: With patient/family Time For Goal Achievement: 12/03/17 Potential to Achieve Goals: Good    Frequency Min 3X/week   Barriers to discharge        Co-evaluation               AM-PAC PT "6 Clicks" Daily Activity  Outcome Measure Difficulty turning over in bed (including adjusting bedclothes, sheets and blankets)?: None Difficulty moving from lying on back to sitting on the side of the bed? : None Difficulty sitting down on and standing up from a chair with arms (e.g., wheelchair, bedside commode, etc,.)?: A Little Help needed moving to and from a bed to chair (including a wheelchair)?: A Little Help needed walking in hospital room?: A Little Help needed climbing 3-5 steps with a railing? : A Lot 6 Click Score: 19    End of Session Equipment Utilized During Treatment: Gait belt Activity Tolerance: Patient tolerated treatment well;Patient limited by fatigue Patient left: in chair;with call bell/phone within reach;with family/visitor present Nurse Communication: Mobility status PT Visit Diagnosis: Unsteadiness on feet (R26.81);Other abnormalities of gait and mobility (R26.89);Muscle weakness (generalized) (M62.81)    Time: 1525-1550 PT Time Calculation (min) (ACUTE ONLY): 25 min   Charges:   PT Evaluation $PT  Eval Moderate Complexity: 1 Mod PT Treatments $Therapeutic Activity: 23-37 mins   PT G Codes:        4:20 PM, 12/25/2017 Ocie Bob, MPT Physical Therapist with [facility] [phone] office [phone] mobile phone

## 2017-11-29 NOTE — Progress Notes (Signed)
Subjective: Interval History: Patient still complains of weakness of her legs.  She did not have any pain.  She denies any nausea or vomiting.  She denies also any difficulty breathing.  Objective: Vital signs in last 24 hours: Temp:  [98 F (36.7 C)-98.5 F (36.9 C)] 98.5 F (36.9 C) (02/01 0500) Pulse Rate:  [60-95] 95 (02/01 0500) Resp:  [18-20] 20 (02/01 0500) BP: (93-140)/(59-87) 112/70 (02/01 0500) SpO2:  [98 %-100 %] 100 % (02/01 0500) Weight:  [62.6 kg (138 lb 1.6 oz)-64.9 kg (143 lb 1.3 oz)] 62.6 kg (138 lb 1.6 oz) (02/01 0500) Weight change:   Intake/Output from previous day: 01/31 0701 - 02/01 0700 In: 925 [P.O.:720; I.V.:205] Out: 1996 [Stool:1] Intake/Output this shift: No intake/output data recorded.  Generally patient is alert and in no apparent distress Chest is clear to auscultation Heart exam revealed iregular rate and rhythm no murmur Extremities: She has trace  edema bilaterally.  Lab Results: Recent Labs    11/28/17 0617 11/29/17 0629  WBC 8.8 7.5  HGB 11.6* 11.9*  HCT 37.3 38.1  PLT 281 234   BMET:  Recent Labs    11/28/17 0617  NA 140  K 3.7  CL 101  CO2 26  GLUCOSE 85  BUN 30*  CREATININE 6.00*  CALCIUM 9.7   No results for input(s): PTH in the last 72 hours. Iron Studies: No results for input(s): IRON, TIBC, TRANSFERRIN, FERRITIN in the last 72 hours.  Studies/Results: No results found.  I have reviewed the patient's current medications.  Assessment/Plan: 1] history of DVT/PE: Presently patient on heparin and Coumadin.  Her leg swelling has gone down significantly. 2] end-stage renal disease: She states she is status post hemodialysis yesterday.  Patient presently does not have any uremic signs and symptoms... 3] anemia: Her hemoglobin has returned to target goal and stable. 4] bone and mineral disorder: Her calcium  is in range but her phosphorus has increased and is above our target goal.  She is on Renvela 800 mg 1 tablet p.o.  3 times daily with meals. 5] fluid management: Patient is presently asymptomatic.  Were able to remove about 1900 cc of fluid with dialysis. 6] atrial fibrillation: Her heart rate is controlled.  She is on diltiazem and metoprolol. Plan: 1] patient does not  require dialysis today.  She will be dialyzed tomorrow which is her regular schedule. 2] we will try to remove about 21/2 L if her systolic blood pressure remains above 90 3] we will check a renal panel in the morning. 4] once patient is discharged we will follow her as an outpatient. 5] will increase Renvela to 800 mg 2 tablets p.o. 3 times daily with meals.  LOS: 5 days   Dois Juarbe S 11/29/2017,8:47 AM

## 2017-11-29 NOTE — Progress Notes (Signed)
PROGRESS NOTE                                                                                                                                                                                                             Patient Demographics:    Patricia Ayala, is a 84 y.o. female, DOB - 1929-03-31, ZOX:[id number]  Admit date - 11/24/2017   Admitting Physician Courage Mariea Clonts, MD  Outpatient Primary MD for the patient is [name], MD  LOS - 5  Outpatient Specialists: Dr. Imogene Burn (vascular surgery)  Chief Complaint  Patient presents with  . Leg Pain       Brief Narrative   84 year old female with ESRD on hemodialysis (T, T, S), A. fib and prior history of DVT off Coumadin since April 2018 due to life-threatening GI bleed from gastric ulcers while on anticoagulation and also taking NSAIDs (she required multiple transfusions and was also placed on ventilator transiently) , anemia of chronic kidney disease who presented with bilateral lower extremity swelling with pain for almost 2 months duration worsened in the past several days. In the ED she was found recently DVT of both lower extremities extending from common femoral vein involving the femoral veins, popliteal and calf veins. ED physician discussed with on-call vascular surgeon Dr. Darrick Penna who recommended no intervention at this time and monitor her on anticoagulation.   Subjective:   Denies any specific symptoms.  Leg pain resolved  Assessment  & Plan :    Principal Problem:   Extensive DVT of lower extremity, bilateral (HCC) On IV heparin drip.  Started on Coumadin on 1/29.  H&H remains  stable.    Given extent of DVT and underlying A. Fib, patient will need lifelong anticoagulation if tolerated.  Active Problems:   ESRD (end stage renal disease) (HCC) On hemodialysis Tuesdays, Thursdays and Saturdays. Renal following.     chronic  Atrial fibrillation with RVR  (HCC) Heart rate better controlled after increasing both Cardizem and metoprolol.  2D echo with normal EF and no wall motion abnormality.    H/o Chronic gastric ulcer with hemorrhage 01/2017  EGD done  negative for ulcers and shows gastritis, biopsy shows inflammation of antral mucosa, negative for H. Pylori. Started coumadin  Anemia of chronic disease Stable.  Left basic vein transposition with imminent thrombosis  patient had left radial artery  cannulation and shuntogram done on 1/21. Showed occlusion of the fistula 2-3 cm distal to the anastomosis with a subtotal occlusion of the anastomosis. Recommends new permanent access to be placed in left arm. Given her acute DVT and need for anticoagulation I think this will have to be postponed for at least 3 months while on active anticoagulation. Continue dialysis with permacath. I will discuss with Dr. Imogene Burn.   Goals of care Palliative care following. At present wants full scope of treatment.    Code Status : Full code  Family Communication  : Husband at bedside  Disposition Plan  : Home once INR therapeutic on Coumadin, Likely early next week  Barriers For Discharge : Active symptoms  Consults  :   Nephrology GI Palliative care  Procedures  :  Doppler lower extremity EGD  DVT Prophylaxis  :  IV heparin/ couamdin  Lab Results  Component Value Date   PLT 234 11/29/2017    Antibiotics  :    Anti-infectives (From admission, onward)   None        Objective:   Vitals:   11/28/17 2015 11/28/17 2241 11/29/17 0500 11/29/17 0904  BP:  116/69 112/70   Pulse:   95   Resp:  20 20   Temp:  98.5 F (36.9 C) 98.5 F (36.9 C)   TempSrc:  Oral Oral   SpO2:  98% 100% 100%  Weight: 62.8 kg (138 lb 7.2 oz)  62.6 kg (138 lb 1.6 oz)   Height:        Wt Readings from Last 3 Encounters:  11/29/17 62.6 kg (138 lb 1.6 oz)  11/18/17 68 kg (150 lb)  11/08/17 68 kg (150 lb)     Intake/Output Summary (Last 24 hours) at 11/29/2017  1134 Last data filed at 11/29/2017 0554 Gross per 24 hour  Intake 684.99 ml  Output 1995 ml  Net -1310.01 ml     Physical Exam : General elderly female not in distress HEENT: Moist mucosa, supple neck Chest: Clear bilaterally, right-sided permacath CVS: S1-S2 irregular, no murmurs GI: Soft, nondistended, nontender Musculoskeletal: Warm, no edema, leg swellings improved       Data Review:    CBC Recent Labs  Lab 11/24/17 1053 11/25/17 0547 11/26/17 0537 11/27/17 0208 11/28/17 0617 11/29/17 0629  WBC 10.1 8.0 8.9 9.1 8.8 7.5  HGB 12.1 11.4* 11.5* 12.0 11.6* 11.9*  HCT 40.3 37.9 37.3 38.7 37.3 38.1  PLT 238 257 297 273 281 234  MCV 97.6 95.9 94.7 94.9 94.2 94.5  MCH 29.3 28.9 29.2 29.4 29.3 29.5  MCHC 30.0 30.1 30.8 31.0 31.1 31.2  RDW 14.5 14.3 14.2 14.2 14.0 14.0  LYMPHSABS 0.9  --   --   --   --   --   MONOABS 0.8  --   --   --   --   --   EOSABS 0.1  --   --   --   --   --   BASOSABS 0.0  --   --   --   --   --     Chemistries  Recent Labs  Lab 11/24/17 1053 11/25/17 0547 11/26/17 0537 11/28/17 0617  NA 139 140 140 140  K 3.6 3.5 3.6 3.7  CL 99* 102 102 101  CO2 25 24 24 26   GLUCOSE 120* 82 99 85  BUN 20 27* 34* 30*  CREATININE 4.63* 5.48* 6.88* 6.00*  CALCIUM 10.2 9.5 9.7 9.7  AST 16  --   --   --  ALT 13*  --   --   --   ALKPHOS 84  --   --   --   BILITOT 0.7  --   --   --    ------------------------------------------------------------------------------------------------------------------ No results for input(s): CHOL, HDL, LDLCALC, TRIG, CHOLHDL, LDLDIRECT in the last 72 hours.  No results found for: HGBA1C ------------------------------------------------------------------------------------------------------------------ No results for input(s): TSH, T4TOTAL, T3FREE, THYROIDAB in the last 72 hours.  Invalid input(s): FREET3 ------------------------------------------------------------------------------------------------------------------ No  results for input(s): VITAMINB12, FOLATE, FERRITIN, TIBC, IRON, RETICCTPCT in the last 72 hours.  Coagulation profile Recent Labs  Lab 11/24/17 1053 11/27/17 0208 11/28/17 0617 11/29/17 0629  INR 0.97 1.05 1.07 1.17    No results for input(s): DDIMER in the last 72 hours.  Cardiac Enzymes No results for input(s): CKMB, TROPONINI, MYOGLOBIN in the last 168 hours.  Invalid input(s): CK ------------------------------------------------------------------------------------------------------------------    Component Value Date/Time   BNP 1,962.0 (H) 11/01/2016 8295    Inpatient Medications  Scheduled Meds: . diltiazem  60 mg Oral Q6H  . metoprolol tartrate  50 mg Oral BID  . multivitamin  1 tablet Oral Daily  . pantoprazole  40 mg Oral Daily  . senna  1 tablet Oral BID  . sevelamer carbonate  1,600 mg Oral TID WC  . sodium chloride flush  3 mL Intravenous Q12H  . warfarin  7.5 mg Oral Once  . Warfarin - Pharmacist Dosing Inpatient   Does not apply q1800   Continuous Infusions: . sodium chloride    . sodium chloride    . sodium chloride    . heparin 950 Units/hr (11/29/17 0745)   PRN Meds:.sodium chloride, sodium chloride, sodium chloride, acetaminophen **OR** acetaminophen, albuterol, ALPRAZolam, alteplase, diltiazem, heparin, HYDROcodone-acetaminophen, hydrOXYzine, ondansetron **OR** ondansetron (ZOFRAN) IV, polyethylene glycol, promethazine, sodium chloride flush, traZODone  Micro Results No results found for this or any previous visit (from the past 240 hour(s)).  Radiology Reports Dg Ankle Complete Left  Result Date: 11/24/2017 CLINICAL DATA:  Pain and swelling for 3 months EXAM: LEFT ANKLE COMPLETE - 3+ VIEW COMPARISON:  None FINDINGS: Diffuse soft tissue swelling. Osseous demineralization. Joint spaces preserved. No acute fracture, dislocation, or bone destruction. Scattered soft tissue calcifications at lower leg including phleboliths and vascular calcifications  extending into foot. IMPRESSION: Significant soft tissue swelling and osseous demineralization without acute bony abnormalities. Electronically Signed   By: Ulyses Southward M.D.   On: 11/24/2017 13:19   Dg Ankle Complete Right  Result Date: 11/24/2017 CLINICAL DATA:  Pain and swelling for 3 months EXAM: RIGHT ANKLE - COMPLETE 3+ VIEW COMPARISON:  None FINDINGS: Marked soft tissue swelling. Diffuse osseous demineralization. Joint spaces preserved. No acute fracture, dislocation, or bone destruction. Small vessel vascular calcifications extending into foot. IMPRESSION: Significant soft tissue swelling and osseous demineralization without acute bony abnormality. Electronically Signed   By: Ulyses Southward M.D.   On: 11/24/2017 13:19   US Venous Img Lower Bilateral  Result Date: 11/24/2017 CLINICAL DATA:  Pain and swelling in both lower extremities for 3 months EXAM: BILATERAL LOWER EXTREMITY VENOUS DOPPLER ULTRASOUND TECHNIQUE: Gray-scale sonography with graded compression, as well as color Doppler and duplex ultrasound were performed to evaluate the lower extremity deep venous systems from the level of the common femoral vein and including the common femoral, femoral, profunda femoral, popliteal and calf veins including the posterior tibial, peroneal and gastrocnemius veins when visible. The superficial great saphenous vein was also interrogated. Spectral Doppler was utilized to evaluate flow at rest and with distal  augmentation maneuvers in the common femoral, femoral and popliteal veins. COMPARISON:  11/06/2016 FINDINGS: RIGHT LOWER EXTREMITY Common Femoral Vein: Nonocclusive hypoechoic thrombus with impaired compressibility and diminished flow Saphenofemoral Junction: No evidence of thrombus. Normal compressibility and flow on color Doppler imaging. Profunda Femoral Vein: No evidence of thrombus. Normal compressibility and flow on color Doppler imaging. Femoral Vein: Nonocclusive hypoechoic thrombus with impaired  compressibility and diminished spontaneous venous flow Popliteal Vein: Nonocclusive hypoechoic thrombus with impaired compressibility and diminished spontaneous venous flow Calf Veins: Occlusive thrombus of the perineal and posterior tibial veins Superficial Great Saphenous Vein: No evidence of thrombus. Normal compressibility. Venous Reflux:  None. Other Findings: Baker cyst RIGHT popliteal fossa 5.2 x 1.9 x 3.8 cm. Scattered subcutaneous edema. LEFT LOWER EXTREMITY Common Femoral Vein: Occlusive hypoechoic thrombus with impaired compressibility and absent spontaneous venous flow Saphenofemoral Junction: Thrombus at the saphenofemoral junction. Profunda Femoral Vein: No evidence of thrombus. Normal compressibility and flow on color Doppler imaging. Femoral Vein: Non occlusive hypoechoic thrombus proximally with occlusive thrombus distally Popliteal Vein: No evidence of thrombus. Normal compressibility, respiratory phasicity and response to augmentation. Calf Veins: No evidence of thrombus. Normal compressibility and flow on color Doppler imaging. Superficial Great Saphenous Vein: No evidence of thrombus in greater saphenous vein. Hypoechoic thrombus seen within lesser saphenous vein with impaired compressibility. Venous Reflux:  None. Other Findings:  Scattered subcutaneous edema IMPRESSION: Positive exam for presence of acute appearing deep venous thrombosis in the lower extremities bilaterally involving BILATERAL common femoral veins, BILATERAL femoral veins, and RIGHT popliteal vein into RIGHT calf veins. Baker cyst RIGHT popliteal fossa. Electronically Signed   By: Ulyses Southward M.D.   On: 11/24/2017 13:12   Dg Foot Complete Left  Result Date: 11/24/2017 CLINICAL DATA:  Pain and swelling for 3 months EXAM: LEFT FOOT - COMPLETE 3+ VIEW COMPARISON:  None. FINDINGS: Significant diffuse soft tissue swelling. Osseous demineralization. Joint spaces preserved. Small vessel vascular calcifications. Calcaneal  spurring. No acute fracture, dislocation or bone destruction. IMPRESSION: Significant soft tissue swelling and osseous demineralization without acute bony abnormalities. Calcaneal spurring. Electronically Signed   By: Ulyses Southward M.D.   On: 11/24/2017 13:17   Dg Foot Complete Right  Result Date: 11/24/2017 CLINICAL DATA:  Pain and swelling for 3 months EXAM: RIGHT FOOT COMPLETE - 3+ VIEW COMPARISON:  None FINDINGS: Significant diffuse soft tissue edema. Osseous demineralization. Joint spaces preserved. No acute fracture, dislocation, or bone destruction. Small plantar calcaneal spur. Small vessel vascular calcifications. IMPRESSION: Osseous demineralization and significant soft tissue swelling without acute bony abnormalities. Calcaneal spur. Electronically Signed   By: Ulyses Southward M.D.   On: 11/24/2017 13:15    Time Spent in minutes  25   Jeovani Weisenburger M.D on 11/29/2017 at 11:34 AM  Between 7am to 7pm - Pager - [phone]  After 7pm go to [email or web address] - password [facility]  [facility] -  Office  [phone]

## 2017-11-29 NOTE — Plan of Care (Signed)
  Acute Rehab PT Goals(only PT should resolve) Pt Will Go Supine/Side To Sit 11/29/2017 1624 - Progressing by Ocie Bob, PT Flowsheets Taken 11/29/2017 1624  Pt will go Supine/Side to Sit Independently Patient Will Transfer Sit To/From Stand 11/29/2017 1624 - Progressing by Ocie Bob, PT Flowsheets Taken 11/29/2017 1624  Patient will transfer sit to/from stand with supervision Pt Will Transfer Bed To Chair/Chair To Bed 11/29/2017 1624 - Progressing by Ocie Bob, PT Flowsheets Taken 11/29/2017 1624  Pt will Transfer Bed to Chair/Chair to Bed with supervision Pt Will Ambulate 11/29/2017 1624 - Progressing by Ocie Bob, PT Flowsheets Taken 11/29/2017 1624  Pt will Ambulate 50 feet;with rolling walker;with supervision  4:25 PM, 11/29/17 Ocie Bob, MPT Physical Therapist with [facility] [phone] office [phone] mobile phone

## 2017-11-29 NOTE — Progress Notes (Addendum)
ANTICOAGULATION CONSULT NOTE - follow up  Pharmacy Consult for HEPARIN bridge with coumadin Indication: VTE treatment  Allergies  Allergen Reactions  . Coumadin [Warfarin Sodium]     Caused GIB   Patient Measurements: Height: 5\' 4"  (162.6 cm) Weight: 138 lb 1.6 oz (62.6 kg) IBW/kg (Calculated) : 54.7 HEPARIN DW (KG): 67.6  Vital Signs: Temp: 98.5 F (36.9 C) (02/01 0500) Temp Source: Oral (02/01 0500) BP: 112/70 (02/01 0500) Pulse Rate: 95 (02/01 0500)  Labs: Recent Labs    11/27/17 0208 11/28/17 0617 11/29/17 0629  HGB 12.0 11.6* 11.9*  HCT 38.7 37.3 38.1  PLT 273 281 234  LABPROT 13.6 13.8 14.8  INR 1.05 1.07 1.17  HEPARINUNFRC 0.54 0.39 0.27*  CREATININE  --  6.00*  --    Estimated Creatinine Clearance: 5.7 mL/min (A) (by C-G formula based on SCr of 6 mg/dL (H)).  Medical History: Past Medical History:  Diagnosis Date  . Anemia 02/11/2012  . Anxiety   . Aortic stenosis    mild AS 10/2016 echo  . Arthritis    arthritis in neck  . Atrial fibrillation (HCC)   . CHF (congestive heart failure) (HCC)   . Chronic kidney disease    hemodialysis Tu/Th/Sa  . Dyspnea    on exertion  . GERD (gastroesophageal reflux disease)   . GI bleed 01/2017   due to gastric ulcer, taking NSAIDs while on coumadin  . H/O metabolic acidosis   . Headache   . Hyperlipidemia   . Hyperparathyroidism, secondary (HCC)   . Hypertension    Dr. Regino Schultze, [place] [facility], [place]  . Peptic ulcer   . Peripheral vascular disease (HCC)   . Vitamin D deficiency    Medications:  Medications Prior to Admission  Medication Sig Dispense Refill Last Dose  . ALPRAZolam (XANAX) 0.5 MG tablet Take 0.5 mg by mouth 3 (three) times daily as needed for anxiety.   11/22/2017 at Unknown time  . diltiazem (CARDIZEM) 60 MG tablet Take 60 mg by mouth daily.   11/23/2017 at Unknown time  . doxycycline (VIBRA-TABS) 100 MG tablet Take 1 tablet by mouth 2 (two) times daily.   11/24/2017 at Unknown time  .  HYDROcodone-acetaminophen (NORCO) 10-325 MG tablet Take 1 tablet by mouth 4 (four) times daily as needed for moderate pain.    11/24/2017 at Unknown time  . hydrOXYzine (ATARAX/VISTARIL) 25 MG tablet Take 1 tablet by mouth 4 (four) times daily as needed for itching.   11/24/2017 at Unknown time  . metoprolol tartrate (LOPRESSOR) 25 MG tablet Take 25 mg by mouth 2 (two) times daily.   11/24/2017 at 0730  . multivitamin (RENA-VIT) TABS tablet Take 1 tablet by mouth daily.   11/23/2017 at Unknown time  . promethazine (PHENERGAN) 25 MG tablet Take 25 mg by mouth every 6 (six) hours as needed for nausea or vomiting.   unknown  . sevelamer (RENAGEL) 800 MG tablet Take 1,600 mg by mouth 3 (three) times daily with meals.   11/23/2017 at Unknown time    Assessment: Heparin level slightly less than goal this am.  INR  1.17 after 2x 5mg  dose and 7.5 mg dose. She was off coumadin due to GI bleed prior to admission.  Goal of Therapy:  INR 2-3 Heparin level 0.3-0.7 units/ml Monitor platelets by anticoagulation protocol: Yes   Plan:   Coumadin 7.5mg  po x 1 today  PT-INR daily  Increase Heparin infusion to 950 units/hr  Heparin level  In 8 hours  CBC and  heparin level daily while on Heparin  Thanks for allowing pharmacy to be a part of this patient's care.  Talbert Cage, PharmD Clinical Pharmacist   11/29/2017 8:02 AM   Addum:  Heparin level 0.74 units/ml.  Will decrease heparin drip to 900 units/hr.  F/u am labs

## 2017-11-30 LAB — HEPARIN LEVEL (UNFRACTIONATED): Heparin Unfractionated: 0.61 IU/mL (ref 0.30–0.70)

## 2017-11-30 LAB — RENAL FUNCTION PANEL
ALBUMIN: 2.4 g/dL — AB (ref 3.5–5.0)
ANION GAP: 14 (ref 5–15)
BUN: 26 mg/dL — AB (ref 6–20)
CO2: 25 mmol/L (ref 22–32)
Calcium: 9.8 mg/dL (ref 8.9–10.3)
Chloride: 98 mmol/L — ABNORMAL LOW (ref 101–111)
Creatinine, Ser: 5.46 mg/dL — ABNORMAL HIGH (ref 0.44–1.00)
GFR calc Af Amer: 7 mL/min — ABNORMAL LOW (ref >60)
GFR calc non Af Amer: 6 mL/min — ABNORMAL LOW (ref >60)
GLUCOSE: 79 mg/dL (ref 65–99)
PHOSPHORUS: 5.1 mg/dL — AB (ref 2.5–4.6)
POTASSIUM: 3.5 mmol/L (ref 3.5–5.1)
SODIUM: 137 mmol/L (ref 135–145)

## 2017-11-30 LAB — PROTIME-INR
INR: 1.5
Prothrombin Time: 18 seconds — ABNORMAL HIGH (ref 11.4–15.2)

## 2017-11-30 MED ORDER — WARFARIN SODIUM 5 MG PO TABS
5.0000 mg | ORAL_TABLET | Freq: Once | ORAL | Status: AC
  Administered 2017-11-30: 5 mg via ORAL
  Filled 2017-11-30: qty 1

## 2017-11-30 MED ORDER — HEPARIN SODIUM (PORCINE) 1000 UNIT/ML IJ SOLN
INTRAMUSCULAR | Status: AC
  Administered 2017-11-30: 3400 [IU] via INTRAVENOUS_CENTRAL
  Filled 2017-11-30: qty 8

## 2017-11-30 NOTE — Progress Notes (Signed)
PROGRESS NOTE                                                                                                                                                                                                             Patient Demographics:    Patricia Ayala, is a 84 y.o. female, DOB - 17-Mar-1929, KXF:[id number]  Admit date - 11/24/2017   Admitting Physician Courage Mariea Clonts, MD  Outpatient Primary MD for the patient is [name], MD  LOS - 6  Outpatient Specialists: Dr. Imogene Burn (vascular surgery)  Chief Complaint  Patient presents with  . Leg Pain       Brief Narrative   84 year old female with ESRD on hemodialysis (T, T, S), A. fib and prior history of DVT off Coumadin since April 2018 due to life-threatening GI bleed from gastric ulcers while on anticoagulation and also taking NSAIDs (she required multiple transfusions and was also placed on ventilator transiently) , anemia of chronic kidney disease who presented with bilateral lower extremity swelling with pain for almost 2 months duration worsened in the past several days. In the ED she was found recently DVT of both lower extremities extending from common femoral vein involving the femoral veins, popliteal and calf veins. ED physician discussed with on-call vascular surgeon Dr. Darrick Penna who recommended no intervention at this time and monitor her on anticoagulation.   Subjective:   Denies any specific symptoms.  Complains of  some pain in her left foot  Assessment  & Plan :    Principal Problem:   Extensive DVT of lower extremity, bilateral (HCC) On IV heparin drip.  Started on Coumadin on 1/29.  INR slowly improving.  H&H stable. Given extent of DVT and underlying A. Fib, patient will need lifelong anticoagulation if tolerated.  Active Problems:   ESRD (end stage renal disease) (HCC) On hemodialysis Tuesdays, Thursdays and Saturdays. Renal following.     chronic  Atrial fibrillation with RVR (HCC) Heart rate stable after Cardizem and metoprolol both increased.  Had a brief drop in heart rate to the 40s this morning but improved rapidly to 80s-90s.  Will monitor on current regimen.    H/o Chronic gastric ulcer with hemorrhage 01/2017  EGD done  negative for ulcers and shows gastritis, biopsy shows inflammation of antral mucosa, negative for H. Pylori. Started coumadin  Anemia of chronic  disease Stable.  Left basic vein transposition with imminent thrombosis  patient had left radial artery cannulation and shuntogram done on 1/21. Showed occlusion of the fistula 2-3 cm distal to the anastomosis with a subtotal occlusion of the anastomosis. Recommends new permanent access to be placed in left arm. Given her acute DVT and need for anticoagulation I think this will have to be postponed for at least 3 months while on active anticoagulation. Continue dialysis with permacath. I will discuss with Dr. Imogene Burn prior to her discharge.   Goals of care Palliative care following. At present wants full scope of treatment.    Code Status : Full code  Family Communication  : None at bedside today  Disposition Plan  : Home once INR therapeutic on Coumadin, Likely early next week  Barriers For Discharge : Active symptoms  Consults  :   Nephrology GI Palliative care  Procedures  :  Doppler lower extremity EGD  DVT Prophylaxis  :  IV heparin/ couamdin  Lab Results  Component Value Date   PLT 234 11/29/2017    Antibiotics  :    Anti-infectives (From admission, onward)   None        Objective:   Vitals:   11/30/17 1200 11/30/17 1215 11/30/17 1230 11/30/17 1245  BP: (!) 86/54 103/64 (!) 99/56 (!) 88/48  Pulse: 63 85 91 88  Resp:      Temp:      TempSrc:      SpO2:      Weight:      Height:        Wt Readings from Last 3 Encounters:  11/30/17 64.1 kg (141 lb 5 oz)  11/18/17 68 kg (150 lb)  11/08/17 68 kg (150 lb)      Intake/Output Summary (Last 24 hours) at 11/30/2017 1252 Last data filed at 11/30/2017 0800 Gross per 24 hour  Intake 87.6 ml  Output -  Net 87.6 ml     Physical Exam General: Elderly female not in distress HEENT: Moist mucosa, supple neck Chest: Clear bilaterally CVS: S1 and S2 irregular, no murmurs GI: Soft, nondistended, nontender Musculoskeletal: Warm, no edema, no leg swelling, mild tenderness over left foot        Data Review:    CBC Recent Labs  Lab 11/24/17 1053 11/25/17 0547 11/26/17 0537 11/27/17 0208 11/28/17 0617 11/29/17 0629  WBC 10.1 8.0 8.9 9.1 8.8 7.5  HGB 12.1 11.4* 11.5* 12.0 11.6* 11.9*  HCT 40.3 37.9 37.3 38.7 37.3 38.1  PLT 238 257 297 273 281 234  MCV 97.6 95.9 94.7 94.9 94.2 94.5  MCH 29.3 28.9 29.2 29.4 29.3 29.5  MCHC 30.0 30.1 30.8 31.0 31.1 31.2  RDW 14.5 14.3 14.2 14.2 14.0 14.0  LYMPHSABS 0.9  --   --   --   --   --   MONOABS 0.8  --   --   --   --   --   EOSABS 0.1  --   --   --   --   --   BASOSABS 0.0  --   --   --   --   --     Chemistries  Recent Labs  Lab 11/24/17 1053 11/25/17 0547 11/26/17 0537 11/28/17 0617 11/30/17 0559  NA 139 140 140 140 137  K 3.6 3.5 3.6 3.7 3.5  CL 99* 102 102 101 98*  CO2 25 24 24 26 25   GLUCOSE 120* 82 99 85 79  BUN 20 27*  34* 30* 26*  CREATININE 4.63* 5.48* 6.88* 6.00* 5.46*  CALCIUM 10.2 9.5 9.7 9.7 9.8  AST 16  --   --   --   --   ALT 13*  --   --   --   --   ALKPHOS 84  --   --   --   --   BILITOT 0.7  --   --   --   --    ------------------------------------------------------------------------------------------------------------------ No results for input(s): CHOL, HDL, LDLCALC, TRIG, CHOLHDL, LDLDIRECT in the last 72 hours.  No results found for: HGBA1C ------------------------------------------------------------------------------------------------------------------ No results for input(s): TSH, T4TOTAL, T3FREE, THYROIDAB in the last 72 hours.  Invalid input(s):  FREET3 ------------------------------------------------------------------------------------------------------------------ No results for input(s): VITAMINB12, FOLATE, FERRITIN, TIBC, IRON, RETICCTPCT in the last 72 hours.  Coagulation profile Recent Labs  Lab 11/24/17 1053 11/27/17 0208 11/28/17 0617 11/29/17 0629 11/30/17 0559  INR 0.97 1.05 1.07 1.17 1.50    No results for input(s): DDIMER in the last 72 hours.  Cardiac Enzymes No results for input(s): CKMB, TROPONINI, MYOGLOBIN in the last 168 hours.  Invalid input(s): CK ------------------------------------------------------------------------------------------------------------------    Component Value Date/Time   BNP 1,962.0 (H) 11/01/2016 1610    Inpatient Medications  Scheduled Meds: . diltiazem  60 mg Oral Q6H  . heparin      . metoprolol tartrate  50 mg Oral BID  . multivitamin  1 tablet Oral Daily  . pantoprazole  40 mg Oral Daily  . senna  1 tablet Oral BID  . sevelamer carbonate  1,600 mg Oral TID WC  . sodium chloride flush  3 mL Intravenous Q12H  . warfarin  5 mg Oral Once  . Warfarin - Pharmacist Dosing Inpatient   Does not apply q1800   Continuous Infusions: . sodium chloride    . sodium chloride    . sodium chloride    . heparin 900 Units/hr (11/29/17 1714)   PRN Meds:.sodium chloride, sodium chloride, sodium chloride, acetaminophen **OR** acetaminophen, albuterol, ALPRAZolam, alteplase, diltiazem, heparin, HYDROcodone-acetaminophen, hydrOXYzine, ondansetron **OR** ondansetron (ZOFRAN) IV, polyethylene glycol, promethazine, sodium chloride flush, traZODone  Micro Results No results found for this or any previous visit (from the past 240 hour(s)).  Radiology Reports Dg Ankle Complete Left  Result Date: 11/24/2017 CLINICAL DATA:  Pain and swelling for 3 months EXAM: LEFT ANKLE COMPLETE - 3+ VIEW COMPARISON:  None FINDINGS: Diffuse soft tissue swelling. Osseous demineralization. Joint spaces  preserved. No acute fracture, dislocation, or bone destruction. Scattered soft tissue calcifications at lower leg including phleboliths and vascular calcifications extending into foot. IMPRESSION: Significant soft tissue swelling and osseous demineralization without acute bony abnormalities. Electronically Signed   By: Ulyses Southward M.D.   On: 11/24/2017 13:19   Dg Ankle Complete Right  Result Date: 11/24/2017 CLINICAL DATA:  Pain and swelling for 3 months EXAM: RIGHT ANKLE - COMPLETE 3+ VIEW COMPARISON:  None FINDINGS: Marked soft tissue swelling. Diffuse osseous demineralization. Joint spaces preserved. No acute fracture, dislocation, or bone destruction. Small vessel vascular calcifications extending into foot. IMPRESSION: Significant soft tissue swelling and osseous demineralization without acute bony abnormality. Electronically Signed   By: Ulyses Southward M.D.   On: 11/24/2017 13:19   US Venous Img Lower Bilateral  Result Date: 11/24/2017 CLINICAL DATA:  Pain and swelling in both lower extremities for 3 months EXAM: BILATERAL LOWER EXTREMITY VENOUS DOPPLER ULTRASOUND TECHNIQUE: Gray-scale sonography with graded compression, as well as color Doppler and duplex ultrasound were performed to evaluate the lower extremity deep  venous systems from the level of the common femoral vein and including the common femoral, femoral, profunda femoral, popliteal and calf veins including the posterior tibial, peroneal and gastrocnemius veins when visible. The superficial great saphenous vein was also interrogated. Spectral Doppler was utilized to evaluate flow at rest and with distal augmentation maneuvers in the common femoral, femoral and popliteal veins. COMPARISON:  11/06/2016 FINDINGS: RIGHT LOWER EXTREMITY Common Femoral Vein: Nonocclusive hypoechoic thrombus with impaired compressibility and diminished flow Saphenofemoral Junction: No evidence of thrombus. Normal compressibility and flow on color Doppler imaging.  Profunda Femoral Vein: No evidence of thrombus. Normal compressibility and flow on color Doppler imaging. Femoral Vein: Nonocclusive hypoechoic thrombus with impaired compressibility and diminished spontaneous venous flow Popliteal Vein: Nonocclusive hypoechoic thrombus with impaired compressibility and diminished spontaneous venous flow Calf Veins: Occlusive thrombus of the perineal and posterior tibial veins Superficial Great Saphenous Vein: No evidence of thrombus. Normal compressibility. Venous Reflux:  None. Other Findings: Baker cyst RIGHT popliteal fossa 5.2 x 1.9 x 3.8 cm. Scattered subcutaneous edema. LEFT LOWER EXTREMITY Common Femoral Vein: Occlusive hypoechoic thrombus with impaired compressibility and absent spontaneous venous flow Saphenofemoral Junction: Thrombus at the saphenofemoral junction. Profunda Femoral Vein: No evidence of thrombus. Normal compressibility and flow on color Doppler imaging. Femoral Vein: Non occlusive hypoechoic thrombus proximally with occlusive thrombus distally Popliteal Vein: No evidence of thrombus. Normal compressibility, respiratory phasicity and response to augmentation. Calf Veins: No evidence of thrombus. Normal compressibility and flow on color Doppler imaging. Superficial Great Saphenous Vein: No evidence of thrombus in greater saphenous vein. Hypoechoic thrombus seen within lesser saphenous vein with impaired compressibility. Venous Reflux:  None. Other Findings:  Scattered subcutaneous edema IMPRESSION: Positive exam for presence of acute appearing deep venous thrombosis in the lower extremities bilaterally involving BILATERAL common femoral veins, BILATERAL femoral veins, and RIGHT popliteal vein into RIGHT calf veins. Baker cyst RIGHT popliteal fossa. Electronically Signed   By: Ulyses Southward M.D.   On: 11/24/2017 13:12   Dg Foot Complete Left  Result Date: 11/24/2017 CLINICAL DATA:  Pain and swelling for 3 months EXAM: LEFT FOOT - COMPLETE 3+ VIEW  COMPARISON:  None. FINDINGS: Significant diffuse soft tissue swelling. Osseous demineralization. Joint spaces preserved. Small vessel vascular calcifications. Calcaneal spurring. No acute fracture, dislocation or bone destruction. IMPRESSION: Significant soft tissue swelling and osseous demineralization without acute bony abnormalities. Calcaneal spurring. Electronically Signed   By: Ulyses Southward M.D.   On: 11/24/2017 13:17   Dg Foot Complete Right  Result Date: 11/24/2017 CLINICAL DATA:  Pain and swelling for 3 months EXAM: RIGHT FOOT COMPLETE - 3+ VIEW COMPARISON:  None FINDINGS: Significant diffuse soft tissue edema. Osseous demineralization. Joint spaces preserved. No acute fracture, dislocation, or bone destruction. Small plantar calcaneal spur. Small vessel vascular calcifications. IMPRESSION: Osseous demineralization and significant soft tissue swelling without acute bony abnormalities. Calcaneal spur. Electronically Signed   By: Ulyses Southward M.D.   On: 11/24/2017 13:15    Time Spent in minutes  25   Dontravious Camille M.D on 11/30/2017 at 12:52 PM  Between 7am to 7pm - Pager - [phone]  After 7pm go to [email or web address] - password [facility]  [facility] -  Office  [phone]

## 2017-11-30 NOTE — Procedures (Signed)
    HEMODIALYSIS TREATMENT NOTE:   4 hour dialysis completed via right IJ tunneled PC. Exit site unremarkable. Goal NOT met: Unable to tolerate removal of 1.5L as ordered.  Ultrafiltration was suspended for 53 minutes and UF rate was repeatedly decreased in response to hypotension (SBP<90, asymptomatic). Net UF 1155cc.  All blood was returned.  Report given to Donavan Foil, RN.  Rockwell Alexandria, RN, CDN

## 2017-11-30 NOTE — Progress Notes (Signed)
Patient reported to have brief episode of heart rate in the upper 40s. Heart rate quickly returned to the 70s per report from central telemetry monitoring. Heart rate remains ine the 80s-90s range at this time. Pt asleep on assessment with dialysis RN present. Reports patient has been resting, no complaints. Text paged Dr. Gonzella Lex to notify and see if she can receive metoprolol and diltiazem as ordered after dialysis. Earnstine Regal, RN

## 2017-11-30 NOTE — Progress Notes (Signed)
ANTICOAGULATION CONSULT NOTE - follow up  Pharmacy Consult for HEPARIN bridge with coumadin Indication: VTE treatment  Allergies  Allergen Reactions  . Coumadin [Warfarin Sodium] Other (See Comments)    GI BLEED > DOSE RELATED   Patient Measurements: Height: 5\' 4"  (162.6 cm) Weight: 140 lb 14 oz (63.9 kg) IBW/kg (Calculated) : 54.7 HEPARIN DW (KG): 67.6  Vital Signs: Temp: 98.7 F (37.1 C) (02/02 0500) Temp Source: Oral (02/02 0500) BP: 125/67 (02/02 0500) Pulse Rate: 90 (02/02 0500)  Labs: Recent Labs    11/28/17 0617 11/29/17 0629 11/29/17 1620 11/30/17 0559  HGB 11.6* 11.9*  --   --   HCT 37.3 38.1  --   --   PLT 281 234  --   --   LABPROT 13.8 14.8  --  18.0*  INR 1.07 1.17  --  1.50  HEPARINUNFRC 0.39 0.27* 0.74* 0.61  CREATININE 6.00*  --   --   --    Estimated Creatinine Clearance: 5.7 mL/min (A) (by C-G formula based on SCr of 6 mg/dL (H)).  Medical History: Past Medical History:  Diagnosis Date  . Anemia 02/11/2012  . Anxiety   . Aortic stenosis    mild AS 10/2016 echo  . Arthritis    arthritis in neck  . Atrial fibrillation (HCC)   . CHF (congestive heart failure) (HCC)   . Chronic kidney disease    hemodialysis Tu/Th/Sa  . Dyspnea    on exertion  . GERD (gastroesophageal reflux disease)   . GI bleed 01/2017   due to gastric ulcer, taking NSAIDs while on coumadin  . H/O metabolic acidosis   . Headache   . Hyperlipidemia   . Hyperparathyroidism, secondary (HCC)   . Hypertension    Dr. Regino Schultze, [place], [place]  . Peptic ulcer   . Peripheral vascular disease (HCC)   . Vitamin D deficiency    Medications:  Medications Prior to Admission  Medication Sig Dispense Refill Last Dose  . ALPRAZolam (XANAX) 0.5 MG tablet Take 0.5 mg by mouth 3 (three) times daily as needed for anxiety.   11/22/2017 at Unknown time  . diltiazem (CARDIZEM) 60 MG tablet Take 60 mg by mouth daily.   11/23/2017 at Unknown time  . doxycycline (VIBRA-TABS) 100 MG tablet Take  1 tablet by mouth 2 (two) times daily.   11/24/2017 at Unknown time  . HYDROcodone-acetaminophen (NORCO) 10-325 MG tablet Take 1 tablet by mouth 4 (four) times daily as needed for moderate pain.    11/24/2017 at Unknown time  . hydrOXYzine (ATARAX/VISTARIL) 25 MG tablet Take 1 tablet by mouth 4 (four) times daily as needed for itching.   11/24/2017 at Unknown time  . metoprolol tartrate (LOPRESSOR) 25 MG tablet Take 25 mg by mouth 2 (two) times daily.   11/24/2017 at 0730  . multivitamin (RENA-VIT) TABS tablet Take 1 tablet by mouth daily.   11/23/2017 at Unknown time  . promethazine (PHENERGAN) 25 MG tablet Take 25 mg by mouth every 6 (six) hours as needed for nausea or vomiting.   unknown  . sevelamer (RENAGEL) 800 MG tablet Take 1,600 mg by mouth 3 (three) times daily with meals.   11/23/2017 at Unknown time    Assessment: Heparin level therapeutic this am  INR  1.5. She was off coumadin due to GI bleed prior to admission.  Goal of Therapy:  INR 2-3 Heparin level 0.3-0.7 units/ml Monitor platelets by anticoagulation protocol: Yes   Plan:   Coumadin 5mg  po x 1  today  PT-INR daily  Continue Heparin infusion at 900 units/hr  CBC and heparin level daily while on Heparin  Thanks for allowing pharmacy to be a part of this patient's care.  Talbert Cage, PharmD Clinical Pharmacist   11/30/2017 8:13 AM

## 2017-11-30 NOTE — Progress Notes (Signed)
Okay to administer metoprolol and diltiazem as ordered once dialysis completed if heart rate remains stable per Dr. Gonzella Lex. Nursing to assess heart rate prior to giving meds. Earnstine Regal, RN

## 2017-11-30 NOTE — Progress Notes (Signed)
Subjective: Interval History: Patient is seen at the beginning of dialysis.  Patient complains of shoulder pain, back pain and leg pain after she had physical therapy yesterday.  Still she has some weakness but improving.  Patient denies any difficulty breathing.  Objective: Vital signs in last 24 hours: Temp:  [98.1 F (36.7 C)-98.7 F (37.1 C)] 98.7 F (37.1 C) (02/02 0500) Pulse Rate:  [88-90] 90 (02/02 0500) Resp:  [16] 16 (02/02 0500) BP: (109-125)/(52-72) 125/67 (02/02 0500) SpO2:  [99 %-100 %] 100 % (02/02 0500) Weight:  [63.9 kg (140 lb 14 oz)] 63.9 kg (140 lb 14 oz) (02/02 0500) Weight change: -1 kg (-3.3 oz)  Intake/Output from previous day: 02/01 0701 - 02/02 0700 In: 84.6 [I.V.:84.6] Out: -  Intake/Output this shift: Total I/O In: 3 [I.V.:3] Out: -   Generally patient is alert and in no apparent distress Chest is clear to auscultation Heart exam revealed iregular rate and rhythm no murmur Extremities: She has trace  edema bilaterally.  Lab Results: Recent Labs    11/28/17 0617 11/29/17 0629  WBC 8.8 7.5  HGB 11.6* 11.9*  HCT 37.3 38.1  PLT 281 234   BMET:  Recent Labs    11/28/17 0617 11/30/17 0559  NA 140 137  K 3.7 3.5  CL 101 98*  CO2 26 25  GLUCOSE 85 79  BUN 30* 26*  CREATININE 6.00* 5.46*  CALCIUM 9.7 9.8   No results for input(s): PTH in the last 72 hours. Iron Studies: No results for input(s): IRON, TIBC, TRANSFERRIN, FERRITIN in the last 72 hours.  Studies/Results: No results found.  I have reviewed the patient's current medications.  Assessment/Plan: 1] history of DVT/PE: Presently patient is being transitioned to Coumadin.  Her leg swelling and her leg pain has improved. 2] end-stage renal disease: Patient is being started on dialysis.  Her potassium is normal.  Patient does not have any uremic signs and symptoms. 3] anemia: Her hemoglobin has come down to our target goal and Epogen is on hold. 4] bone and mineral disorder: Her  calcium  is in range and her phosphorus has improved to 5.1.  She is on a binder. 5] fluid management: Patient is presently asymptomatic.  6] atrial fibrillation: Her heart rate is controlled.  She is on diltiazem and metoprolol. Plan: 1]  we will try to remove about 21/2 L if her systolic blood pressure remains above 90 2] we will continue his other medications as before.  LOS: 6 days   Shariece Viveiros S 11/30/2017,9:13 AM

## 2017-12-01 LAB — PROTIME-INR
INR: 1.98
Prothrombin Time: 22.4 seconds — ABNORMAL HIGH (ref 11.4–15.2)

## 2017-12-01 LAB — HEPARIN LEVEL (UNFRACTIONATED): HEPARIN UNFRACTIONATED: 0.59 [IU]/mL (ref 0.30–0.70)

## 2017-12-01 MED ORDER — WARFARIN SODIUM 2 MG PO TABS
3.0000 mg | ORAL_TABLET | ORAL | Status: DC
  Filled 2017-12-01: qty 1

## 2017-12-01 MED ORDER — DEXTROSE 5 % IV SOLN: 1.5000 g | INTRAVENOUS | Status: DC

## 2017-12-01 MED ORDER — SODIUM CHLORIDE 0.9 % IV SOLN: INTRAVENOUS | Status: DC

## 2017-12-01 NOTE — Progress Notes (Signed)
ANTICOAGULATION CONSULT NOTE - follow up  Pharmacy Consult for HEPARIN bridge with coumadin Indication: VTE treatment  Allergies  Allergen Reactions  . Coumadin [Warfarin Sodium] Other (See Comments)    GI BLEED > DOSE RELATED   Patient Measurements: Height: 5\' 4"  (162.6 cm) Weight: 139 lb (63 kg) IBW/kg (Calculated) : 54.7 HEPARIN DW (KG): 67.6  Vital Signs: Temp: 98.5 F (36.9 C) (02/03 0616) Temp Source: Oral (02/03 0616) BP: 135/74 (02/03 0616) Pulse Rate: 83 (02/03 0616)  Labs: Recent Labs    11/29/17 0629 11/29/17 1620 11/30/17 0559 12/01/17 0614  HGB 11.9*  --   --   --   HCT 38.1  --   --   --   PLT 234  --   --   --   LABPROT 14.8  --  18.0* 22.4*  INR 1.17  --  1.50 1.98  HEPARINUNFRC 0.27* 0.74* 0.61 0.59  CREATININE  --   --  5.46*  --    Estimated Creatinine Clearance: 6.3 mL/min (A) (by C-G formula based on SCr of 5.46 mg/dL (H)).  Medical History: Past Medical History:  Diagnosis Date  . Anemia 02/11/2012  . Anxiety   . Aortic stenosis    mild AS 10/2016 echo  . Arthritis    arthritis in neck  . Atrial fibrillation (HCC)   . CHF (congestive heart failure) (HCC)   . Chronic kidney disease    hemodialysis Tu/Th/Sa  . Dyspnea    on exertion  . GERD (gastroesophageal reflux disease)   . GI bleed 01/2017   due to gastric ulcer, taking NSAIDs while on coumadin  . H/O metabolic acidosis   . Headache   . Hyperlipidemia   . Hyperparathyroidism, secondary (HCC)   . Hypertension    Dr. Regino Schultze, [place], [place]  . Peptic ulcer   . Peripheral vascular disease (HCC)   . Vitamin D deficiency    Medications:  Medications Prior to Admission  Medication Sig Dispense Refill Last Dose  . ALPRAZolam (XANAX) 0.5 MG tablet Take 0.5 mg by mouth 3 (three) times daily as needed for anxiety.   11/22/2017 at Unknown time  . diltiazem (CARDIZEM) 60 MG tablet Take 60 mg by mouth daily.   11/23/2017 at Unknown time  . doxycycline (VIBRA-TABS) 100 MG tablet Take 1  tablet by mouth 2 (two) times daily.   11/24/2017 at Unknown time  . HYDROcodone-acetaminophen (NORCO) 10-325 MG tablet Take 1 tablet by mouth 4 (four) times daily as needed for moderate pain.    11/24/2017 at Unknown time  . hydrOXYzine (ATARAX/VISTARIL) 25 MG tablet Take 1 tablet by mouth 4 (four) times daily as needed for itching.   11/24/2017 at Unknown time  . metoprolol tartrate (LOPRESSOR) 25 MG tablet Take 25 mg by mouth 2 (two) times daily.   11/24/2017 at 0730  . multivitamin (RENA-VIT) TABS tablet Take 1 tablet by mouth daily.   11/23/2017 at Unknown time  . promethazine (PHENERGAN) 25 MG tablet Take 25 mg by mouth every 6 (six) hours as needed for nausea or vomiting.   unknown  . sevelamer (RENAGEL) 800 MG tablet Take 1,600 mg by mouth 3 (three) times daily with meals.   11/23/2017 at Unknown time    Assessment: Heparin level therapeutic this am  INR  1.98. She was off coumadin due to GI bleed prior to admission.  Goal of Therapy:  INR 2-3 Heparin level 0.3-0.7 units/ml Monitor platelets by anticoagulation protocol: Yes   Plan:   Coumadin  3mg  po daily  PT-INR daily  dc Heparin?   CBC and heparin level daily while on Heparin  Thanks for allowing pharmacy to be a part of this patient's care.  Talbert Cage, PharmD Clinical Pharmacist   12/01/2017 8:46 AM

## 2017-12-01 NOTE — Progress Notes (Signed)
Subjective: Interval History: Patient is feeling much better.  She denies any difficulty breathing.  Objective: Vital signs in last 24 hours: Temp:  [98.1 F (36.7 C)-98.5 F (36.9 C)] 98.5 F (36.9 C) (02/03 0616) Pulse Rate:  [63-94] 83 (02/03 0616) Resp:  [15-20] 20 (02/03 0616) BP: (86-135)/(48-74) 135/74 (02/03 0616) SpO2:  [99 %-100 %] 100 % (02/03 0616) Weight:  [62.9 kg (138 lb 10.7 oz)-63 kg (139 lb)] 63 kg (139 lb) (02/03 0616) Weight change: 0.2 kg (7.1 oz)  Intake/Output from previous day: 02/02 0701 - 02/03 0700 In: 909.8 [P.O.:720; I.V.:189.8] Out: 1155  Intake/Output this shift: No intake/output data recorded.  Generally patient is alert and in no apparent distress Chest is clear to auscultation Heart exam revealed iregular rate and rhythm no murmur Extremities: She has trace  edema bilaterally.  Lab Results: Recent Labs    11/29/17 0629  WBC 7.5  HGB 11.9*  HCT 38.1  PLT 234   BMET:  Recent Labs    11/30/17 0559  NA 137  K 3.5  CL 98*  CO2 25  GLUCOSE 79  BUN 26*  CREATININE 5.46*  CALCIUM 9.8   No results for input(s): PTH in the last 72 hours. Iron Studies: No results for input(s): IRON, TIBC, TRANSFERRIN, FERRITIN in the last 72 hours.  Studies/Results: No results found.  I have reviewed the patient's current medications.  Assessment/Plan: 1] history of DVT/PE: Presently patient is being transitioned to Coumadin.  Her INR is 1.98 and is still on heparin.  Her pain has improved 2] end-stage renal disease: She is status post hemodialysis yesterday and she is a symptomatic 3] anemia: Her hemoglobin has remained within our target goal. 4] bone and mineral disorder: Her calcium and phosphorus is range..  She is on a binder. 5] fluid management: Patient is presently asymptomatic.  Were able to remove about a liter yesterday 6] atrial fibrillation: Her heart rate is controlled.  She is on diltiazem and metoprolol. Plan: 1] patient does  require dialysis today.  She will be dialyzed on Tuesday which is her regular schedule. 2] we will continue with present treatment  LOS: 7 days   Donice Alperin S 12/01/2017,9:53 AM

## 2017-12-01 NOTE — Progress Notes (Signed)
PROGRESS NOTE                                                                                                                                                                                                             Patient Demographics:    Patricia Ayala, is a 84 y.o. female, DOB - 1929/08/18, ZOX:[id number]  Admit date - 11/24/2017   Admitting Physician Courage Mariea Clonts, MD  Outpatient Primary MD for the patient is [name], MD  LOS - 7  Outpatient Specialists: Dr. Imogene Burn (vascular surgery)  Chief Complaint  Patient presents with  . Leg Pain       Brief Narrative   84 year old female with ESRD on hemodialysis (T, T, S), A. fib and prior history of DVT off Coumadin since April 2018 due to life-threatening GI bleed from gastric ulcers while on anticoagulation and also taking NSAIDs (she required multiple transfusions and was also placed on ventilator transiently) , anemia of chronic kidney disease who presented with bilateral lower extremity swelling with pain for almost 2 months duration worsened in the past several days. In the ED she was found recently DVT of both lower extremities extending from common femoral vein involving the femoral veins, popliteal and calf veins. ED physician discussed with on-call vascular surgeon Dr. Darrick Penna who recommended no intervention at this time and monitor her on anticoagulation.   Subjective:   Denies any specific symptoms.  No pain in her foot today.  Assessment  & Plan :    Principal Problem:   Extensive DVT of lower extremity, bilateral (HCC) On IV heparin drip.  Started on Coumadin on 1/29.  INR slowly improving.  H&H stable. Given extent of DVT and underlying A. Fib, patient will need lifelong anticoagulation if tolerated.  Active Problems:   ESRD (end stage renal disease) (HCC) On hemodialysis Tuesdays, Thursdays and Saturdays. Renal following.     chronic  Atrial  fibrillation with RVR (HCC) Heart rate mostly stable but occasionally tachycardic to 120s.  Continue increased dose of Cardizem and metoprolol.  On IV heparin bridging for Coumadin.    H/o Chronic gastric ulcer with hemorrhage 01/2017  EGD done  negative for ulcers and shows gastritis, biopsy shows inflammation of antral mucosa, negative for H. Pylori. Started coumadin  Anemia of chronic disease Stable.  Left basic vein transposition with imminent thrombosis  patient had left radial artery cannulation and shuntogram done on 1/21. Showed occlusion of the fistula 2-3 cm distal to the anastomosis with a subtotal occlusion of the anastomosis. Recommends new permanent access to be placed in left arm. Given her acute DVT and need for anticoagulation I think this will have to be postponed for at least 3 months while on active anticoagulation. Continue dialysis with permacath. I will discuss with Dr. Imogene Burn prior to her discharge.   Goals of care Palliative care consult appreciated.  At present wants full scope of treatment.    Code Status : Full code  Family Communication  : Husband at bedside  Disposition Plan  : Home once INR therapeutic on Coumadin, Likely-3 days.  Barriers For Discharge : Active symptoms  Consults  :   Nephrology GI Palliative care  Procedures  :  Doppler lower extremity EGD  DVT Prophylaxis  :  IV heparin/ couamdin  Lab Results  Component Value Date   PLT 234 11/29/2017    Antibiotics  :    Anti-infectives (From admission, onward)   Start     Dose/Rate Route Frequency Ordered Stop   12/01/17 0836  cefUROXime (ZINACEF) 1.5 g in dextrose 5 % 50 mL IVPB  Status:  Discontinued     1.5 g 100 mL/hr over 30 Minutes Intravenous 30 min pre-op 12/01/17 0836 12/01/17 0842        Objective:   Vitals:   11/30/17 1330 11/30/17 1357 11/30/17 2108 12/01/17 0616  BP: 117/66 106/67 119/63 135/74  Pulse: 88 94 66 83  Resp: 15 18 20 20   Temp: 98.4 F (36.9 C) 98.2  F (36.8 C) 98.1 F (36.7 C) 98.5 F (36.9 C)  TempSrc: Oral Oral Oral Oral  SpO2: 99% 100% 100% 100%  Weight: 62.9 kg (138 lb 10.7 oz)   63 kg (139 lb)  Height:        Wt Readings from Last 3 Encounters:  12/01/17 63 kg (139 lb)  11/18/17 68 kg (150 lb)  11/08/17 68 kg (150 lb)     Intake/Output Summary (Last 24 hours) at 12/01/2017 1435 Last data filed at 12/01/2017 1300 Gross per 24 hour  Intake 1146.75 ml  Output -  Net 1146.75 ml     Physical Exam General: Elderly female not in distress HEENT: Moist mucosa, supple neck, no pallor Chest: Clear bilaterally, right-sided permacath CVS: S1-S2 irregular, no murmurs GI: Soft, nontender, nondistended Musculoskeletal: Warm, no edema         Data Review:    CBC Recent Labs  Lab 11/25/17 0547 11/26/17 0537 11/27/17 0208 11/28/17 0617 11/29/17 0629  WBC 8.0 8.9 9.1 8.8 7.5  HGB 11.4* 11.5* 12.0 11.6* 11.9*  HCT 37.9 37.3 38.7 37.3 38.1  PLT 257 297 273 281 234  MCV 95.9 94.7 94.9 94.2 94.5  MCH 28.9 29.2 29.4 29.3 29.5  MCHC 30.1 30.8 31.0 31.1 31.2  RDW 14.3 14.2 14.2 14.0 14.0    Chemistries  Recent Labs  Lab 11/25/17 0547 11/26/17 0537 11/28/17 0617 11/30/17 0559  NA 140 140 140 137  K 3.5 3.6 3.7 3.5  CL 102 102 101 98*  CO2 24 24 26 25   GLUCOSE 82 99 85 79  BUN 27* 34* 30* 26*  CREATININE 5.48* 6.88* 6.00* 5.46*  CALCIUM 9.5 9.7 9.7 9.8   ------------------------------------------------------------------------------------------------------------------ No results for input(s): CHOL, HDL, LDLCALC, TRIG, CHOLHDL, LDLDIRECT in the last 72 hours.  No results found for: HGBA1C ------------------------------------------------------------------------------------------------------------------ No results for input(s):  TSH, T4TOTAL, T3FREE, THYROIDAB in the last 72 hours.  Invalid input(s):  FREET3 ------------------------------------------------------------------------------------------------------------------ No results for input(s): VITAMINB12, FOLATE, FERRITIN, TIBC, IRON, RETICCTPCT in the last 72 hours.  Coagulation profile Recent Labs  Lab 11/27/17 0208 11/28/17 0617 11/29/17 0629 11/30/17 0559 12/01/17 0614  INR 1.05 1.07 1.17 1.50 1.98    No results for input(s): DDIMER in the last 72 hours.  Cardiac Enzymes No results for input(s): CKMB, TROPONINI, MYOGLOBIN in the last 168 hours.  Invalid input(s): CK ------------------------------------------------------------------------------------------------------------------    Component Value Date/Time   BNP 1,962.0 (H) 11/01/2016 9604    Inpatient Medications  Scheduled Meds: . diltiazem  60 mg Oral Q6H  . metoprolol tartrate  50 mg Oral BID  . multivitamin  1 tablet Oral Daily  . pantoprazole  40 mg Oral Daily  . senna  1 tablet Oral BID  . sevelamer carbonate  1,600 mg Oral TID WC  . sodium chloride flush  3 mL Intravenous Q12H  . warfarin  3 mg Oral Q24H  . Warfarin - Pharmacist Dosing Inpatient   Does not apply q1800   Continuous Infusions: . sodium chloride    . sodium chloride    . sodium chloride    . heparin 900 Units/hr (11/30/17 0915)   PRN Meds:.sodium chloride, sodium chloride, sodium chloride, acetaminophen **OR** acetaminophen, albuterol, ALPRAZolam, alteplase, diltiazem, heparin, HYDROcodone-acetaminophen, hydrOXYzine, ondansetron **OR** ondansetron (ZOFRAN) IV, polyethylene glycol, promethazine, sodium chloride flush, traZODone  Micro Results No results found for this or any previous visit (from the past 240 hour(s)).  Radiology Reports Dg Ankle Complete Left  Result Date: 11/24/2017 CLINICAL DATA:  Pain and swelling for 3 months EXAM: LEFT ANKLE COMPLETE - 3+ VIEW COMPARISON:  None FINDINGS: Diffuse soft tissue swelling. Osseous demineralization. Joint spaces preserved. No acute  fracture, dislocation, or bone destruction. Scattered soft tissue calcifications at lower leg including phleboliths and vascular calcifications extending into foot. IMPRESSION: Significant soft tissue swelling and osseous demineralization without acute bony abnormalities. Electronically Signed   By: Ulyses Southward M.D.   On: 11/24/2017 13:19   Dg Ankle Complete Right  Result Date: 11/24/2017 CLINICAL DATA:  Pain and swelling for 3 months EXAM: RIGHT ANKLE - COMPLETE 3+ VIEW COMPARISON:  None FINDINGS: Marked soft tissue swelling. Diffuse osseous demineralization. Joint spaces preserved. No acute fracture, dislocation, or bone destruction. Small vessel vascular calcifications extending into foot. IMPRESSION: Significant soft tissue swelling and osseous demineralization without acute bony abnormality. Electronically Signed   By: Ulyses Southward M.D.   On: 11/24/2017 13:19   US Venous Img Lower Bilateral  Result Date: 11/24/2017 CLINICAL DATA:  Pain and swelling in both lower extremities for 3 months EXAM: BILATERAL LOWER EXTREMITY VENOUS DOPPLER ULTRASOUND TECHNIQUE: Gray-scale sonography with graded compression, as well as color Doppler and duplex ultrasound were performed to evaluate the lower extremity deep venous systems from the level of the common femoral vein and including the common femoral, femoral, profunda femoral, popliteal and calf veins including the posterior tibial, peroneal and gastrocnemius veins when visible. The superficial great saphenous vein was also interrogated. Spectral Doppler was utilized to evaluate flow at rest and with distal augmentation maneuvers in the common femoral, femoral and popliteal veins. COMPARISON:  11/06/2016 FINDINGS: RIGHT LOWER EXTREMITY Common Femoral Vein: Nonocclusive hypoechoic thrombus with impaired compressibility and diminished flow Saphenofemoral Junction: No evidence of thrombus. Normal compressibility and flow on color Doppler imaging. Profunda Femoral Vein:  No evidence of thrombus. Normal compressibility and flow on color Doppler imaging. Femoral Vein:  Nonocclusive hypoechoic thrombus with impaired compressibility and diminished spontaneous venous flow Popliteal Vein: Nonocclusive hypoechoic thrombus with impaired compressibility and diminished spontaneous venous flow Calf Veins: Occlusive thrombus of the perineal and posterior tibial veins Superficial Great Saphenous Vein: No evidence of thrombus. Normal compressibility. Venous Reflux:  None. Other Findings: Baker cyst RIGHT popliteal fossa 5.2 x 1.9 x 3.8 cm. Scattered subcutaneous edema. LEFT LOWER EXTREMITY Common Femoral Vein: Occlusive hypoechoic thrombus with impaired compressibility and absent spontaneous venous flow Saphenofemoral Junction: Thrombus at the saphenofemoral junction. Profunda Femoral Vein: No evidence of thrombus. Normal compressibility and flow on color Doppler imaging. Femoral Vein: Non occlusive hypoechoic thrombus proximally with occlusive thrombus distally Popliteal Vein: No evidence of thrombus. Normal compressibility, respiratory phasicity and response to augmentation. Calf Veins: No evidence of thrombus. Normal compressibility and flow on color Doppler imaging. Superficial Great Saphenous Vein: No evidence of thrombus in greater saphenous vein. Hypoechoic thrombus seen within lesser saphenous vein with impaired compressibility. Venous Reflux:  None. Other Findings:  Scattered subcutaneous edema IMPRESSION: Positive exam for presence of acute appearing deep venous thrombosis in the lower extremities bilaterally involving BILATERAL common femoral veins, BILATERAL femoral veins, and RIGHT popliteal vein into RIGHT calf veins. Baker cyst RIGHT popliteal fossa. Electronically Signed   By: Ulyses Southward M.D.   On: 11/24/2017 13:12   Dg Foot Complete Left  Result Date: 11/24/2017 CLINICAL DATA:  Pain and swelling for 3 months EXAM: LEFT FOOT - COMPLETE 3+ VIEW COMPARISON:  None. FINDINGS:  Significant diffuse soft tissue swelling. Osseous demineralization. Joint spaces preserved. Small vessel vascular calcifications. Calcaneal spurring. No acute fracture, dislocation or bone destruction. IMPRESSION: Significant soft tissue swelling and osseous demineralization without acute bony abnormalities. Calcaneal spurring. Electronically Signed   By: Ulyses Southward M.D.   On: 11/24/2017 13:17   Dg Foot Complete Right  Result Date: 11/24/2017 CLINICAL DATA:  Pain and swelling for 3 months EXAM: RIGHT FOOT COMPLETE - 3+ VIEW COMPARISON:  None FINDINGS: Significant diffuse soft tissue edema. Osseous demineralization. Joint spaces preserved. No acute fracture, dislocation, or bone destruction. Small plantar calcaneal spur. Small vessel vascular calcifications. IMPRESSION: Osseous demineralization and significant soft tissue swelling without acute bony abnormalities. Calcaneal spur. Electronically Signed   By: Ulyses Southward M.D.   On: 11/24/2017 13:15    Time Spent in minutes  25   Taishawn Smaldone M.D on 12/01/2017 at 2:35 PM  Between 7am to 7pm - Pager - [phone]  After 7pm go to [email or web address] - password [facility]  [facility] -  Office  [phone]

## 2017-12-02 ENCOUNTER — Ambulatory Visit (HOSPITAL_COMMUNITY): Admission: RE | Admit: 2017-12-02 | Payer: Medicare Other | Source: Ambulatory Visit | Admitting: Vascular Surgery

## 2017-12-02 LAB — HEPARIN LEVEL (UNFRACTIONATED): Heparin Unfractionated: 0.63 IU/mL (ref 0.30–0.70)

## 2017-12-02 LAB — PROTIME-INR
INR: 2.16
Prothrombin Time: 23.9 seconds — ABNORMAL HIGH (ref 11.4–15.2)

## 2017-12-02 SURGERY — INSERTION OF ARTERIOVENOUS (AV) GORE-TEX GRAFT ARM
Anesthesia: Monitor Anesthesia Care | Laterality: Left

## 2017-12-02 MED ORDER — WARFARIN SODIUM 5 MG PO TABS
5.0000 mg | ORAL_TABLET | Freq: Once | ORAL | Status: AC
  Administered 2017-12-02: 5 mg via ORAL
  Filled 2017-12-02: qty 1

## 2017-12-02 NOTE — Progress Notes (Signed)
PROGRESS NOTE                                                                                                                                                                                                             Patient Demographics:    Patricia Ayala, is a 84 y.o. female, DOB - [birth year], WUJ:[id number]  Admit date - 11/24/2017   Admitting Physician Courage Mariea Clonts, MD  Outpatient Primary MD for the patient is [name], MD  LOS - 8  Outpatient Specialists: Dr. Imogene Burn (vascular surgery)  Chief Complaint  Patient presents with  . Leg Pain       Brief Narrative   84 year old female with ESRD on hemodialysis (T, T, S), A. fib and prior history of DVT off Coumadin since April 2018 due to life-threatening GI bleed from gastric ulcers while on anticoagulation and also taking NSAIDs (she required multiple transfusions and was also placed on ventilator transiently) , anemia of chronic kidney disease who presented with bilateral lower extremity swelling with pain for almost 2 months duration worsened in the past several days. In the ED she was found recently DVT of both lower extremities extending from common femoral vein involving the femoral veins, popliteal and calf veins. ED physician discussed with on-call vascular surgeon Dr. Darrick Penna who recommended no intervention at this time and monitor her on anticoagulation.   Subjective:   No overnight events. HR stable  Assessment  & Plan :    Principal Problem:   Extensive DVT of lower extremity, bilateral (HCC) On IV heparin drip.  Started on Coumadin on 1/29.  INR therapeutic today. Needs another day of bridging and possible d/c home tomorrow. Given extent of DVT and underlying A. Fib, patient will need lifelong anticoagulation if tolerated.  Active Problems:   ESRD (end stage renal disease) (HCC) On hemodialysis Tuesdays, Thursdays and Saturdays. Renal following.      chronic  Atrial fibrillation with RVR (HCC) Heart rate mostly stable but occasionally tachycardic to 120s.  Continue increased dose of Cardizem and metoprolol.  On IV heparin bridging for Coumadin.    H/o Chronic gastric ulcer with hemorrhage 01/2017  EGD done  negative for ulcers and shows gastritis, biopsy shows inflammation of antral mucosa, negative for H. Pylori. Started coumadin  Anemia of chronic disease Stable.  Left basic vein transposition with imminent thrombosis  patient had left radial artery cannulation and shuntogram done on 1/21. Showed occlusion of the fistula 2-3 cm distal to the anastomosis with a subtotal occlusion of the anastomosis. Recommends new permanent access to be placed in left arm. Given her acute DVT and need for anticoagulation I think this will have to be postponed for at least 3 months while on active anticoagulation. Continue dialysis with permacath. I will discuss with Dr. Imogene Burn prior to her discharge.   Goals of care Palliative care consult appreciated.  At present wants full scope of treatment.    Code Status : Full code  Family Communication  : Husband at bedside  Disposition Plan  : Home tomorrow if INR remains therapeutic on coumadin  Barriers For Discharge : Active symptoms  Consults  :   Nephrology GI Palliative care  Procedures  :  Doppler lower extremity EGD  DVT Prophylaxis  :  IV heparin/ couamdin  Lab Results  Component Value Date   PLT 234 11/29/2017    Antibiotics  :    Anti-infectives (From admission, onward)   Start     Dose/Rate Route Frequency Ordered Stop   12/01/17 0836  cefUROXime (ZINACEF) 1.5 g in dextrose 5 % 50 mL IVPB  Status:  Discontinued     1.5 g 100 mL/hr over 30 Minutes Intravenous 30 min pre-op 12/01/17 0836 12/01/17 0842        Objective:   Vitals:   12/01/17 1300 12/01/17 1900 12/01/17 2300 12/02/17 0600  BP: 121/65 (!) 92/51 (!) 121/59 (!) 121/56  Pulse: 70 88    Resp: 19 19  18    Temp: 98.3 F (36.8 C) 98.6 F (37 C)  99 F (37.2 C)  TempSrc: Oral Oral  Oral  SpO2: 99% 100%  97%  Weight:      Height:        Wt Readings from Last 3 Encounters:  12/01/17 63 kg (139 lb)  11/18/17 68 kg (150 lb)  11/08/17 68 kg (150 lb)     Intake/Output Summary (Last 24 hours) at 12/02/2017 0946 Last data filed at 12/02/2017 0600 Gross per 24 hour  Intake 696 ml  Output -  Net 696 ml     Physical Exam Gen: NAD  HEENT: moist mucosa, supple neck  chest" clear b/l  CVS: S1&S2, irregular GI: soft, NT, ND Musculoskeletal: warm, no edema          Data Review:    CBC Recent Labs  Lab 11/26/17 0537 11/27/17 0208 11/28/17 0617 11/29/17 0629  WBC 8.9 9.1 8.8 7.5  HGB 11.5* 12.0 11.6* 11.9*  HCT 37.3 38.7 37.3 38.1  PLT 297 273 281 234  MCV 94.7 94.9 94.2 94.5  MCH 29.2 29.4 29.3 29.5  MCHC 30.8 31.0 31.1 31.2  RDW 14.2 14.2 14.0 14.0    Chemistries  Recent Labs  Lab 11/26/17 0537 11/28/17 0617 11/30/17 0559  NA 140 140 137  K 3.6 3.7 3.5  CL 102 101 98*  CO2 24 26 25   GLUCOSE 99 85 79  BUN 34* 30* 26*  CREATININE 6.88* 6.00* 5.46*  CALCIUM 9.7 9.7 9.8   ------------------------------------------------------------------------------------------------------------------ No results for input(s): CHOL, HDL, LDLCALC, TRIG, CHOLHDL, LDLDIRECT in the last 72 hours.  No results found for: HGBA1C ------------------------------------------------------------------------------------------------------------------ No results for input(s): TSH, T4TOTAL, T3FREE, THYROIDAB in the last 72 hours.  Invalid input(s): FREET3 ------------------------------------------------------------------------------------------------------------------ No results for input(s): VITAMINB12, FOLATE, FERRITIN, TIBC, IRON, RETICCTPCT in the last 72 hours.  Coagulation profile Recent Labs  Lab 11/28/17 0617 11/29/17 0629 11/30/17 0559 12/01/17 0614 12/02/17 0552  INR 1.07  1.17 1.50 1.98 2.16    No results for input(s): DDIMER in the last 72 hours.  Cardiac Enzymes No results for input(s): CKMB, TROPONINI, MYOGLOBIN in the last 168 hours.  Invalid input(s): CK ------------------------------------------------------------------------------------------------------------------    Component Value Date/Time   BNP 1,962.0 (H) 11/01/2016 1884    Inpatient Medications  Scheduled Meds: . diltiazem  60 mg Oral Q6H  . metoprolol tartrate  50 mg Oral BID  . multivitamin  1 tablet Oral Daily  . pantoprazole  40 mg Oral Daily  . senna  1 tablet Oral BID  . sevelamer carbonate  1,600 mg Oral TID WC  . sodium chloride flush  3 mL Intravenous Q12H  . warfarin  5 mg Oral Once  . Warfarin - Pharmacist Dosing Inpatient   Does not apply q1800   Continuous Infusions: . sodium chloride    . sodium chloride    . sodium chloride    . heparin 900 Units/hr (12/01/17 1729)   PRN Meds:.sodium chloride, sodium chloride, sodium chloride, acetaminophen **OR** acetaminophen, albuterol, ALPRAZolam, alteplase, diltiazem, heparin, HYDROcodone-acetaminophen, hydrOXYzine, ondansetron **OR** ondansetron (ZOFRAN) IV, polyethylene glycol, promethazine, sodium chloride flush, traZODone  Micro Results No results found for this or any previous visit (from the past 240 hour(s)).  Radiology Reports Dg Ankle Complete Left  Result Date: 11/24/2017 CLINICAL DATA:  Pain and swelling for 3 months EXAM: LEFT ANKLE COMPLETE - 3+ VIEW COMPARISON:  None FINDINGS: Diffuse soft tissue swelling. Osseous demineralization. Joint spaces preserved. No acute fracture, dislocation, or bone destruction. Scattered soft tissue calcifications at lower leg including phleboliths and vascular calcifications extending into foot. IMPRESSION: Significant soft tissue swelling and osseous demineralization without acute bony abnormalities. Electronically Signed   By: Ulyses Southward M.D.   On: 11/24/2017 13:19   Dg Ankle  Complete Right  Result Date: 11/24/2017 CLINICAL DATA:  Pain and swelling for 3 months EXAM: RIGHT ANKLE - COMPLETE 3+ VIEW COMPARISON:  None FINDINGS: Marked soft tissue swelling. Diffuse osseous demineralization. Joint spaces preserved. No acute fracture, dislocation, or bone destruction. Small vessel vascular calcifications extending into foot. IMPRESSION: Significant soft tissue swelling and osseous demineralization without acute bony abnormality. Electronically Signed   By: Ulyses Southward M.D.   On: 11/24/2017 13:19   US Venous Img Lower Bilateral  Result Date: 11/24/2017 CLINICAL DATA:  Pain and swelling in both lower extremities for 3 months EXAM: BILATERAL LOWER EXTREMITY VENOUS DOPPLER ULTRASOUND TECHNIQUE: Gray-scale sonography with graded compression, as well as color Doppler and duplex ultrasound were performed to evaluate the lower extremity deep venous systems from the level of the common femoral vein and including the common femoral, femoral, profunda femoral, popliteal and calf veins including the posterior tibial, peroneal and gastrocnemius veins when visible. The superficial great saphenous vein was also interrogated. Spectral Doppler was utilized to evaluate flow at rest and with distal augmentation maneuvers in the common femoral, femoral and popliteal veins. COMPARISON:  11/06/2016 FINDINGS: RIGHT LOWER EXTREMITY Common Femoral Vein: Nonocclusive hypoechoic thrombus with impaired compressibility and diminished flow Saphenofemoral Junction: No evidence of thrombus. Normal compressibility and flow on color Doppler imaging. Profunda Femoral Vein: No evidence of thrombus. Normal compressibility and flow on color Doppler imaging. Femoral Vein: Nonocclusive hypoechoic thrombus with impaired compressibility and diminished spontaneous venous flow Popliteal Vein: Nonocclusive hypoechoic thrombus with impaired compressibility and diminished spontaneous venous flow Calf Veins: Occlusive thrombus of the  perineal and posterior tibial veins  Superficial Great Saphenous Vein: No evidence of thrombus. Normal compressibility. Venous Reflux:  None. Other Findings: Baker cyst RIGHT popliteal fossa 5.2 x 1.9 x 3.8 cm. Scattered subcutaneous edema. LEFT LOWER EXTREMITY Common Femoral Vein: Occlusive hypoechoic thrombus with impaired compressibility and absent spontaneous venous flow Saphenofemoral Junction: Thrombus at the saphenofemoral junction. Profunda Femoral Vein: No evidence of thrombus. Normal compressibility and flow on color Doppler imaging. Femoral Vein: Non occlusive hypoechoic thrombus proximally with occlusive thrombus distally Popliteal Vein: No evidence of thrombus. Normal compressibility, respiratory phasicity and response to augmentation. Calf Veins: No evidence of thrombus. Normal compressibility and flow on color Doppler imaging. Superficial Great Saphenous Vein: No evidence of thrombus in greater saphenous vein. Hypoechoic thrombus seen within lesser saphenous vein with impaired compressibility. Venous Reflux:  None. Other Findings:  Scattered subcutaneous edema IMPRESSION: Positive exam for presence of acute appearing deep venous thrombosis in the lower extremities bilaterally involving BILATERAL common femoral veins, BILATERAL femoral veins, and RIGHT popliteal vein into RIGHT calf veins. Baker cyst RIGHT popliteal fossa. Electronically Signed   By: Ulyses Southward M.D.   On: 11/24/2017 13:12   Dg Foot Complete Left  Result Date: 11/24/2017 CLINICAL DATA:  Pain and swelling for 3 months EXAM: LEFT FOOT - COMPLETE 3+ VIEW COMPARISON:  None. FINDINGS: Significant diffuse soft tissue swelling. Osseous demineralization. Joint spaces preserved. Small vessel vascular calcifications. Calcaneal spurring. No acute fracture, dislocation or bone destruction. IMPRESSION: Significant soft tissue swelling and osseous demineralization without acute bony abnormalities. Calcaneal spurring. Electronically Signed   By:  Ulyses Southward M.D.   On: 11/24/2017 13:17   Dg Foot Complete Right  Result Date: 11/24/2017 CLINICAL DATA:  Pain and swelling for 3 months EXAM: RIGHT FOOT COMPLETE - 3+ VIEW COMPARISON:  None FINDINGS: Significant diffuse soft tissue edema. Osseous demineralization. Joint spaces preserved. No acute fracture, dislocation, or bone destruction. Small plantar calcaneal spur. Small vessel vascular calcifications. IMPRESSION: Osseous demineralization and significant soft tissue swelling without acute bony abnormalities. Calcaneal spur. Electronically Signed   By: Ulyses Southward M.D.   On: 11/24/2017 13:15    Time Spent in minutes  25   Beau Ramsburg M.D on 12/02/2017 at 9:46 AM  Between 7am to 7pm - Pager - [phone]  After 7pm go to [email or web address] - password [facility]  [facility] -  Office  [phone]

## 2017-12-02 NOTE — Care Management (Addendum)
Pt recommended for HHPT. CM spoke with pt about HHPT. PT refuses HH referral at this time. Pt has been to SNF in the past, says her husband is with her 24/7 and pt does not feel she needs HH PT. Pt on [facility] registry and CM will make referral for Emmi transition calls.

## 2017-12-02 NOTE — Progress Notes (Signed)
ANTICOAGULATION CONSULT NOTE - follow up  Pharmacy Consult for HEPARIN bridge with coumadin Indication: VTE treatment  Allergies  Allergen Reactions  . Coumadin [Warfarin Sodium] Other (See Comments)    GI BLEED > DOSE RELATED   Patient Measurements: Height: 5\' 4"  (162.6 cm) Weight: 139 lb (63 kg) IBW/kg (Calculated) : 54.7 HEPARIN DW (KG): 67.6  Vital Signs: Temp: 99 F (37.2 C) (02/04 0600) Temp Source: Oral (02/04 0600) BP: 121/56 (02/04 0600)  Labs: Recent Labs    11/30/17 0559 12/01/17 0614 12/02/17 0552  LABPROT 18.0* 22.4* 23.9*  INR 1.50 1.98 2.16  HEPARINUNFRC 0.61 0.59 0.63  CREATININE 5.46*  --   --    Estimated Creatinine Clearance: 6.3 mL/min (A) (by C-G formula based on SCr of 5.46 mg/dL (H)).  Medical History: Past Medical History:  Diagnosis Date  . Anemia 02/11/2012  . Anxiety   . Aortic stenosis    mild AS 10/2016 echo  . Arthritis    arthritis in neck  . Atrial fibrillation (HCC)   . CHF (congestive heart failure) (HCC)   . Chronic kidney disease    hemodialysis Tu/Th/Sa  . Dyspnea    on exertion  . GERD (gastroesophageal reflux disease)   . GI bleed 01/2017   due to gastric ulcer, taking NSAIDs while on coumadin  . H/O metabolic acidosis   . Headache   . Hyperlipidemia   . Hyperparathyroidism, secondary (HCC)   . Hypertension    Dr. Regino Schultze, [place],   . Peptic ulcer   . Peripheral vascular disease (HCC)   . Vitamin D deficiency    Medications:  Medications Prior to Admission  Medication Sig Dispense Refill Last Dose  . ALPRAZolam (XANAX) 0.5 MG tablet Take 0.5 mg by mouth 3 (three) times daily as needed for anxiety.   11/22/2017 at Unknown time  . diltiazem (CARDIZEM) 60 MG tablet Take 60 mg by mouth daily.   11/23/2017 at Unknown time  . doxycycline (VIBRA-TABS) 100 MG tablet Take 1 tablet by mouth 2 (two) times daily.   11/24/2017 at Unknown time  . HYDROcodone-acetaminophen (NORCO) 10-325 MG tablet Take 1 tablet by mouth 4  (four) times daily as needed for moderate pain.    11/24/2017 at Unknown time  . hydrOXYzine (ATARAX/VISTARIL) 25 MG tablet Take 1 tablet by mouth 4 (four) times daily as needed for itching.   11/24/2017 at Unknown time  . metoprolol tartrate (LOPRESSOR) 25 MG tablet Take 25 mg by mouth 2 (two) times daily.   11/24/2017 at 0730  . multivitamin (RENA-VIT) TABS tablet Take 1 tablet by mouth daily.   11/23/2017 at Unknown time  . promethazine (PHENERGAN) 25 MG tablet Take 25 mg by mouth every 6 (six) hours as needed for nausea or vomiting.   unknown  . sevelamer (RENAGEL) 800 MG tablet Take 1,600 mg by mouth 3 (three) times daily with meals.   11/23/2017 at Unknown time    Assessment: Heparin level therapeutic this am  INR  2.16. She was off coumadin due to GI bleed prior to admission.  Goal of Therapy:  INR 2-3 Heparin level 0.3-0.7 units/ml Monitor platelets by anticoagulation protocol: Yes   Plan:   Coumadin 5 mg today x 1 dose  PT-INR daily  dc Heparin when appropriate   CBC and heparin level daily while on Heparin  Thanks for allowing pharmacy to be a part of this patient's care.   Judeth Cornfield, PharmD Clinical Pharmacist 12/02/2017 8:10 AM

## 2017-12-02 NOTE — Progress Notes (Signed)
Physical Therapy Treatment Patient Details Name: Patricia Ayala MRN: [id number] DOB: [birth year] Today's Date: 12/02/2017    History of Present Illness  Patricia Ayala  is a 84 y.o. female past medical history relevant for ESRD with hemodialysis on Tuesdays Thursdays and Saturdays, Afib, HFpEF, chronic anemia of CKD and prior history of DVT with prior life-threatening GI bleed in 01/2017 while on anticoagulation and taking NSAIDs who now presents with bilateral lower extremity swelling and pain for over 2 months, Leg pains/swelling have worsened over the last several days.  Patient denies chest pains or pleuritic symptoms, no hypoxia.  No hemoptysis, no productive cough, no fever, no chills.  She does endorse dyspnea on exertion.    PT Comments    Patient declined to ambulate beyond bedside today due to c/o lightheadedness when standing, able to transfer to chair and demonstrates good return for completing BLE ROM/strengthening exercises.  Patient will benefit from continued physical therapy in hospital and recommended venue below to increase strength, balance, endurance for safe ADLs and gait.    Follow Up Recommendations  Home health PT;Supervision for mobility/OOB     Equipment Recommendations  None recommended by PT    Recommendations for Other Services       Precautions / Restrictions Precautions Precautions: Fall Restrictions Weight Bearing Restrictions: No    Mobility  Bed Mobility Overal bed mobility: Needs Assistance Bed Mobility: Supine to Sit     Supine to sit: Supervision        Transfers Overall transfer level: Needs assistance Equipment used: Rolling walker (2 wheeled) Transfers: Sit to/from [employer] Transfers Sit to Stand: Supervision Stand pivot transfers: Supervision       General transfer comment: slightly labored movement  Ambulation/Gait Ambulation/Gait assistance: Min guard Ambulation Distance (Feet): 3 Feet Assistive device: Rolling  walker (2 wheeled) Gait Pattern/deviations: Decreased step length - left;Decreased stance time - right;Decreased stride length   Gait velocity interpretation: Below normal speed for age/gender General Gait Details: Patient declined to ambulate beyond bedside due to c/o mild lightheadedness upon standing up.   Stairs            Wheelchair Mobility    Modified Rankin (Stroke Patients Only)       Balance Overall balance assessment: Needs assistance Sitting-balance support: No upper extremity supported;Feet supported Sitting balance-Leahy Scale: Good     Standing balance support: Bilateral upper extremity supported;During functional activity Standing balance-Leahy Scale: Fair Standing balance comment: with RW                            Cognition                                              Exercises General Exercises - Lower Extremity Ankle Circles/Pumps: Seated;AROM;Strengthening;Both;10 reps Long Arc Quad: Seated;AROM;Strengthening;Both;10 reps Hip Flexion/Marching: Seated;AROM;Strengthening;Both;10 reps    General Comments        Pertinent Vitals/Pain Pain Assessment: No/denies pain    Home Living                      Prior Function            PT Goals (current goals can now be found in the care plan section) Acute Rehab PT Goals Patient Stated Goal: return home with family to assist PT Goal Formulation:  With patient/family Time For Goal Achievement: 12/03/17 Potential to Achieve Goals: Good Progress towards PT goals: Progressing toward goals    Frequency    Min 3X/week      PT Plan Current plan remains appropriate    Co-evaluation              AM-PAC PT "6 Clicks" Daily Activity  Outcome Measure  Difficulty turning over in bed (including adjusting bedclothes, sheets and blankets)?: None Difficulty moving from lying on back to sitting on the side of the bed? : None Difficulty sitting down on  and standing up from a chair with arms (e.g., wheelchair, bedside commode, etc,.)?: A Little Help needed moving to and from a bed to chair (including a wheelchair)?: A Little Help needed walking in hospital room?: A Little Help needed climbing 3-5 steps with a railing? : A Lot 6 Click Score: 19    End of Session   Activity Tolerance: Patient tolerated treatment well;Patient limited by fatigue Patient left: in chair;with call bell/phone within reach Nurse Communication: Mobility status PT Visit Diagnosis: Unsteadiness on feet (R26.81);Other abnormalities of gait and mobility (R26.89);Muscle weakness (generalized) (M62.81)     Time: 3953-2023 PT Time Calculation (min) (ACUTE ONLY): 20 min  Charges:  $Therapeutic Activity: 8-22 mins                    G Codes:       12:03 PM, 01/01/2018 Patricia Ayala, MPT Physical Therapist with [facility] [phone] office [phone] mobile phone

## 2017-12-02 NOTE — Progress Notes (Signed)
Subjective: Interval History: Patient continued to improve.  Still she is weak but getting better.  Presently she denies any nausea or vomiting.  Objective: Vital signs in last 24 hours: Temp:  [98.3 F (36.8 C)-99 F (37.2 C)] 99 F (37.2 C) (02/04 0600) Pulse Rate:  [70-88] 88 (02/03 1900) Resp:  [18-19] 18 (02/04 0600) BP: (92-121)/(51-65) 121/56 (02/04 0600) SpO2:  [97 %-100 %] 97 % (02/04 0600) Weight change:   Intake/Output from previous day: 02/03 0701 - 02/04 0700 In: 936 [P.O.:720; I.V.:216] Out: -  Intake/Output this shift: No intake/output data recorded.  Generally patient is alert and in no apparent distress Chest is clear to auscultation Heart exam revealed iregular rate and rhythm no murmur Extremities: She has trace  edema bilaterally.  Lab Results: No results for input(s): WBC, HGB, HCT, PLT in the last 72 hours. BMET:  Recent Labs    11/30/17 0559  NA 137  K 3.5  CL 98*  CO2 25  GLUCOSE 79  BUN 26*  CREATININE 5.46*  CALCIUM 9.8   No results for input(s): PTH in the last 72 hours. Iron Studies: No results for input(s): IRON, TIBC, TRANSFERRIN, FERRITIN in the last 72 hours.  Studies/Results: No results found.  I have reviewed the patient's current medications.  Assessment/Plan: 1] history of DVT/PE: Presently patient is being transitioned to Coumadin.  Her INR has come up to therapeutic range.  Patient presently still on heparin as a bridge. 2] end-stage renal disease: She is status post hemodialysis on Saturday.  Patient is a symptomatic. 3] anemia: Her hemoglobin has remained within our target goal. 4] bone and mineral disorder: Her calcium and phosphorus is range..  She is on a binder. 5] fluid management: No sign of fluid overload and patient denies any difficulty breathing. 6] atrial fibrillation: Her heart rate is controlled.  She is on diltiazem and metoprolol. Plan: 1] patient does require dialysis today. 2] will make arrangement for  patient to get dialysis tomorrow which is her regular schedule. 3] we will continue with present treatment  LOS: 8 days   Cylie Dor S 12/02/2017,10:15 AM

## 2017-12-03 LAB — PROTIME-INR
INR: 2.66
Prothrombin Time: 28.2 seconds — ABNORMAL HIGH (ref 11.4–15.2)

## 2017-12-03 LAB — RENAL FUNCTION PANEL
Albumin: 2.7 g/dL — ABNORMAL LOW (ref 3.5–5.0)
Anion gap: 14 (ref 5–15)
BUN: 30 mg/dL — AB (ref 6–20)
CHLORIDE: 98 mmol/L — AB (ref 101–111)
CO2: 26 mmol/L (ref 22–32)
CREATININE: 7.01 mg/dL — AB (ref 0.44–1.00)
Calcium: 10.5 mg/dL — ABNORMAL HIGH (ref 8.9–10.3)
GFR calc Af Amer: 5 mL/min — ABNORMAL LOW (ref >60)
GFR calc non Af Amer: 5 mL/min — ABNORMAL LOW (ref >60)
GLUCOSE: 104 mg/dL — AB (ref 65–99)
Phosphorus: 4.7 mg/dL — ABNORMAL HIGH (ref 2.5–4.6)
Potassium: 3.5 mmol/L (ref 3.5–5.1)
Sodium: 138 mmol/L (ref 135–145)

## 2017-12-03 LAB — HEPARIN LEVEL (UNFRACTIONATED): HEPARIN UNFRACTIONATED: 0.69 [IU]/mL (ref 0.30–0.70)

## 2017-12-03 MED ORDER — CINACALCET HCL 30 MG PO TABS: 30.0000 mg | ORAL_TABLET | Freq: Every day | ORAL | Status: DC

## 2017-12-03 MED ORDER — WARFARIN SODIUM 2 MG PO TABS
2.0000 mg | ORAL_TABLET | Freq: Once | ORAL | Status: AC
  Administered 2017-12-03: 2 mg via ORAL
  Filled 2017-12-03: qty 1

## 2017-12-03 MED ORDER — METOPROLOL TARTRATE 25 MG PO TABS: 50.0000 mg | ORAL_TABLET | Freq: Two times a day (BID) | ORAL | 0 refills | Status: AC

## 2017-12-03 MED ORDER — HEPARIN SODIUM (PORCINE) 1000 UNIT/ML IJ SOLN
INTRAMUSCULAR | Status: AC
  Administered 2017-12-03: 3400 [IU] via INTRAVENOUS_CENTRAL
  Filled 2017-12-03: qty 7

## 2017-12-03 MED ORDER — WARFARIN SODIUM 2.5 MG PO TABS: 2.5000 mg | ORAL_TABLET | Freq: Every day | ORAL | 0 refills | Status: DC

## 2017-12-03 NOTE — Care Management Important Message (Signed)
Important Message  Patient Details  Name: Patricia Ayala MRN: [id number] Date of Birth: May 29, 1929   Medicare Important Message Given:  Yes    Whitten Andreoni, Chrystine Oiler, RN 12/03/2017, 12:24 PM

## 2017-12-03 NOTE — Procedures (Signed)
     HEMODIALYSIS TREATMENT NOTE:   3.5 hour heparin-free dialysis completed via right IJ tunneled catheter. Exit site unremarkable. Goal NOT met:  BP unable to tolerate removal of 1.5L as ordered.  Ultrafiltration was suspended for 30 minutes due to hypotension (SBP<90, asymptomatic). Net UF 1.2L.  All blood was returned.  Report given to [place], RN.  Rockwell Alexandria, RN, CDN

## 2017-12-03 NOTE — Progress Notes (Signed)
IV removed, WNL. D/C instructions given to pt and spouse. Verbalized understanding. Pt spouse to transport home.

## 2017-12-03 NOTE — Discharge Summary (Addendum)
Physician Discharge Summary  Patricia Ayala YYT:[id number] DOB: Dec 20, 1928 DOA: 11/24/2017  PCP: [name], MD  Admit date: 11/24/2017 Discharge date: 12/03/2017  Admitted From: Home Disposition: Home  Recommendations for Outpatient Follow-up:  1. Follow up with PCP in 1 week.  Needs INR monitoring as outpatient. 2. Follow-up with vascular surgery Dr. Imogene Burn in 4 weeks.  Home Health: None (patient refused home health) Equipment/Devices: None  Discharge Condition: Fair CODE STATUS: Code Diet recommendation: Heart Healthy /renal     Discharge Diagnoses:  Principal Problem:   Extensive DVT of lower extremity, bilateral (HCC)   Active Problems:   Atrial fibrillation with RVR (HCC)   ESRD (end stage renal disease) (HCC)   Benign essential HTN   H/o Chronic gastric ulcer with hemorrhage 01/2017   Palliative care encounter   DNR (do not resuscitate) discussion   Goals of care, counseling/discussion  Brief narrative/HPI 84 year old female with ESRD on hemodialysis (T, T, S), A. fib and prior history of DVT off Coumadin since April 2018 due to life-threatening GI bleed from gastric ulcers while on anticoagulation and also taking NSAIDs (she required multiple transfusions and was also placed on ventilator transiently) , anemia of chronic kidney disease who presented with bilateral lower extremity swelling with pain for almost 2 months duration worsened in the past several days. In the ED she was found recently DVT of both lower extremities extending from common femoral vein involving the femoral veins, popliteal and calf veins. ED physician discussed with on-call vascular surgeon Dr. Darrick Penna who recommended no intervention at this time and monitor her on anticoagulation.  Hospital course  Principal Problem:   Extensive DVT of lower extremity, bilateral (HCC) Placed on IV heparin drip and started on Coumadin on 1/29.  INR now therapeutic for 2 days.  Will be discharged on oral  Coumadin 2.5 mg daily. Given extent of DVT and underlying A. Fib, patient will need lifelong anticoagulation if tolerated.  Spoke with her PCP.Marland Kitchen  He recommends outpatient follow-up within 1 week and he will arrange INR monitoring.  Active Problems:   ESRD (end stage renal disease) (HCC) On hemodialysis Tuesdays, Thursdays and Saturdays. Renal following.   discharge home after dialysis today.    chronic  Atrial fibrillation with RVR (HCC) Heart rate mostly stable but occasionally tachycardic to 120s.  Continue current dose of Cardizem and metoprolol dose increased.  Now on Coumadin.     H/o Chronic gastric ulcer with hemorrhage 01/2017  EGD done  negative for ulcers and shows gastritis, biopsy shows inflammation of antral mucosa, negative for H. Pylori.  H&H remains stable.  Anemia of chronic disease Stable.  Left basic vein transposition with imminent thrombosis  patient had left radial artery cannulation and shuntogram done on 1/21. Showed occlusion of the fistula 2-3 cm distal to the anastomosis with a subtotal occlusion of the anastomosis. Recommends new permanent access to be placed in left arm. Given her acute DVT and need for anticoagulation I think this will have to be postponed for at least 3 months while on active anticoagulation. Continue dialysis with permacath.  I discussed with Dr. Imogene Burn agrees on postponing the procedure for at least a few months and have patient follow-up with him.   Goals of care Palliative care consult appreciated.  At present wants full scope of treatment.      Family Communication  : Husband at bedside  Disposition Plan  : Home    Consults  :   Nephrology GI Palliative care  Procedures  :  Doppler lower extremity EGD   Discharge Instructions   Allergies as of 12/03/2017      Reactions   Coumadin [warfarin Sodium] Other (See Comments)   GI BLEED > DOSE RELATED      Medication List    STOP taking these  medications   doxycycline 100 MG tablet Commonly known as:  VIBRA-TABS     TAKE these medications   ALPRAZolam 0.5 MG tablet Commonly known as:  XANAX Take 0.5 mg by mouth 3 (three) times daily as needed for anxiety.   diltiazem 60 MG tablet Commonly known as:  CARDIZEM Take 60 mg by mouth daily.   HYDROcodone-acetaminophen 10-325 MG tablet Commonly known as:  NORCO Take 1 tablet by mouth 4 (four) times daily as needed for moderate pain.   hydrOXYzine 25 MG tablet Commonly known as:  ATARAX/VISTARIL Take 1 tablet by mouth 4 (four) times daily as needed for itching.   metoprolol tartrate 25 MG tablet Commonly known as:  LOPRESSOR Take 2 tablets (50 mg total) by mouth 2 (two) times daily. What changed:  how much to take   multivitamin Tabs tablet Take 1 tablet by mouth daily.   promethazine 25 MG tablet Commonly known as:  PHENERGAN Take 25 mg by mouth every 6 (six) hours as needed for nausea or vomiting.   sevelamer 800 MG tablet Commonly known as:  RENAGEL Take 1,600 mg by mouth 3 (three) times daily with meals.   warfarin 2.5 MG tablet Commonly known as:  COUMADIN Take as directed. If you are unsure how to take this medication, talk to your nurse or doctor. Original instructions:  Take 1 tablet (2.5 mg total) by mouth daily at 6 PM. Start taking on:  12/04/2017      Follow-up Information    [name], MD. Schedule an appointment as soon as possible for a visit in 1 week(s).   Specialty:  Internal Medicine Contact information: [address] [place] [address] [phone]        Fransisco Hertz, MD Follow up.   Specialties:  Vascular Surgery, Cardiology Contact information: [address] [place] [address] [phone]          Allergies  Allergen Reactions  . Coumadin [Warfarin Sodium] Other (See Comments)    GI BLEED > DOSE RELATED    Consultations:   Procedures/Studies: Dg Ankle Complete Left  Result Date:  11/24/2017 CLINICAL DATA:  Pain and swelling for 3 months EXAM: LEFT ANKLE COMPLETE - 3+ VIEW COMPARISON:  None FINDINGS: Diffuse soft tissue swelling. Osseous demineralization. Joint spaces preserved. No acute fracture, dislocation, or bone destruction. Scattered soft tissue calcifications at lower leg including phleboliths and vascular calcifications extending into foot. IMPRESSION: Significant soft tissue swelling and osseous demineralization without acute bony abnormalities. Electronically Signed   By: Ulyses Southward M.D.   On: 11/24/2017 13:19   Dg Ankle Complete Right  Result Date: 11/24/2017 CLINICAL DATA:  Pain and swelling for 3 months EXAM: RIGHT ANKLE - COMPLETE 3+ VIEW COMPARISON:  None FINDINGS: Marked soft tissue swelling. Diffuse osseous demineralization. Joint spaces preserved. No acute fracture, dislocation, or bone destruction. Small vessel vascular calcifications extending into foot. IMPRESSION: Significant soft tissue swelling and osseous demineralization without acute bony abnormality. Electronically Signed   By: Ulyses Southward M.D.   On: 11/24/2017 13:19   US Venous Img Lower Bilateral  Result Date: 11/24/2017 CLINICAL DATA:  Pain and swelling in both lower extremities for 3 months EXAM: BILATERAL LOWER EXTREMITY VENOUS DOPPLER ULTRASOUND  TECHNIQUE: Gray-scale sonography with graded compression, as well as color Doppler and duplex ultrasound were performed to evaluate the lower extremity deep venous systems from the level of the common femoral vein and including the common femoral, femoral, profunda femoral, popliteal and calf veins including the posterior tibial, peroneal and gastrocnemius veins when visible. The superficial great saphenous vein was also interrogated. Spectral Doppler was utilized to evaluate flow at rest and with distal augmentation maneuvers in the common femoral, femoral and popliteal veins. COMPARISON:  11/06/2016 FINDINGS: RIGHT LOWER EXTREMITY Common Femoral Vein:  Nonocclusive hypoechoic thrombus with impaired compressibility and diminished flow Saphenofemoral Junction: No evidence of thrombus. Normal compressibility and flow on color Doppler imaging. Profunda Femoral Vein: No evidence of thrombus. Normal compressibility and flow on color Doppler imaging. Femoral Vein: Nonocclusive hypoechoic thrombus with impaired compressibility and diminished spontaneous venous flow Popliteal Vein: Nonocclusive hypoechoic thrombus with impaired compressibility and diminished spontaneous venous flow Calf Veins: Occlusive thrombus of the perineal and posterior tibial veins Superficial Great Saphenous Vein: No evidence of thrombus. Normal compressibility. Venous Reflux:  None. Other Findings: Baker cyst RIGHT popliteal fossa 5.2 x 1.9 x 3.8 cm. Scattered subcutaneous edema. LEFT LOWER EXTREMITY Common Femoral Vein: Occlusive hypoechoic thrombus with impaired compressibility and absent spontaneous venous flow Saphenofemoral Junction: Thrombus at the saphenofemoral junction. Profunda Femoral Vein: No evidence of thrombus. Normal compressibility and flow on color Doppler imaging. Femoral Vein: Non occlusive hypoechoic thrombus proximally with occlusive thrombus distally Popliteal Vein: No evidence of thrombus. Normal compressibility, respiratory phasicity and response to augmentation. Calf Veins: No evidence of thrombus. Normal compressibility and flow on color Doppler imaging. Superficial Great Saphenous Vein: No evidence of thrombus in greater saphenous vein. Hypoechoic thrombus seen within lesser saphenous vein with impaired compressibility. Venous Reflux:  None. Other Findings:  Scattered subcutaneous edema IMPRESSION: Positive exam for presence of acute appearing deep venous thrombosis in the lower extremities bilaterally involving BILATERAL common femoral veins, BILATERAL femoral veins, and RIGHT popliteal vein into RIGHT calf veins. Baker cyst RIGHT popliteal fossa. Electronically Signed    By: Ulyses Southward M.D.   On: 11/24/2017 13:12   Dg Foot Complete Left  Result Date: 11/24/2017 CLINICAL DATA:  Pain and swelling for 3 months EXAM: LEFT FOOT - COMPLETE 3+ VIEW COMPARISON:  None. FINDINGS: Significant diffuse soft tissue swelling. Osseous demineralization. Joint spaces preserved. Small vessel vascular calcifications. Calcaneal spurring. No acute fracture, dislocation or bone destruction. IMPRESSION: Significant soft tissue swelling and osseous demineralization without acute bony abnormalities. Calcaneal spurring. Electronically Signed   By: Ulyses Southward M.D.   On: 11/24/2017 13:17   Dg Foot Complete Right  Result Date: 11/24/2017 CLINICAL DATA:  Pain and swelling for 3 months EXAM: RIGHT FOOT COMPLETE - 3+ VIEW COMPARISON:  None FINDINGS: Significant diffuse soft tissue edema. Osseous demineralization. Joint spaces preserved. No acute fracture, dislocation, or bone destruction. Small plantar calcaneal spur. Small vessel vascular calcifications. IMPRESSION: Osseous demineralization and significant soft tissue swelling without acute bony abnormalities. Calcaneal spur. Electronically Signed   By: Ulyses Southward M.D.   On: 11/24/2017 13:15   EGD Diffuse congestion in the stomach without any ulcer or bleeding..  All inflammation on biopsy, negative for H. Pylori.  Subjective: Denies further pain in her legs.  No overnight events.  Discharge Exam: Vitals:   12/03/17 1130 12/03/17 1200  BP: (!) 150/78 125/71  Pulse: 82 86  Resp:    Temp:    SpO2:     Vitals:   12/03/17 1035 12/03/17 1100  12/03/17 1130 12/03/17 1200  BP: (!) 93/57 123/69 (!) 150/78 125/71  Pulse: 63 64 82 86  Resp:      Temp:      TempSrc:      SpO2:      Weight:      Height:        Gen: NAD  HEENT: moist mucosa, supple neck  chest" clear b/l  CVS: S1&S2, irregular GI: soft, NT, ND Musculoskeletal: warm, no edema      The results of significant diagnostics from this hospitalization (including  imaging, microbiology, ancillary and laboratory) are listed below for reference.     Microbiology: No results found for this or any previous visit (from the past 240 hour(s)).   Labs: BNP (last 3 results) No results for input(s): BNP in the last 8760 hours. Basic Metabolic Panel: Recent Labs  Lab 11/28/17 0617 11/30/17 0559 12/03/17 0518  NA 140 137 138  K 3.7 3.5 3.5  CL 101 98* 98*  CO2 26 25 26   GLUCOSE 85 79 104*  BUN 30* 26* 30*  CREATININE 6.00* 5.46* 7.01*  CALCIUM 9.7 9.8 10.5*  PHOS 5.7* 5.1* 4.7*   Liver Function Tests: Recent Labs  Lab 11/28/17 0617 11/30/17 0559 12/03/17 0518  ALBUMIN 2.5* 2.4* 2.7*   No results for input(s): LIPASE, AMYLASE in the last 168 hours. No results for input(s): AMMONIA in the last 168 hours. CBC: Recent Labs  Lab 11/27/17 0208 11/28/17 0617 11/29/17 0629  WBC 9.1 8.8 7.5  HGB 12.0 11.6* 11.9*  HCT 38.7 37.3 38.1  MCV 94.9 94.2 94.5  PLT 273 281 234   Cardiac Enzymes: No results for input(s): CKTOTAL, CKMB, CKMBINDEX, TROPONINI in the last 168 hours. BNP: Invalid input(s): POCBNP CBG: No results for input(s): GLUCAP in the last 168 hours. D-Dimer No results for input(s): DDIMER in the last 72 hours. Hgb A1c No results for input(s): HGBA1C in the last 72 hours. Lipid Profile No results for input(s): CHOL, HDL, LDLCALC, TRIG, CHOLHDL, LDLDIRECT in the last 72 hours. Thyroid function studies No results for input(s): TSH, T4TOTAL, T3FREE, THYROIDAB in the last 72 hours.  Invalid input(s): FREET3 Anemia work up No results for input(s): VITAMINB12, FOLATE, FERRITIN, TIBC, IRON, RETICCTPCT in the last 72 hours. Urinalysis    Component Value Date/Time   COLORURINE AMBER (A) 05/04/2017 1053   APPEARANCEUR CLOUDY (A) 05/04/2017 1053   LABSPEC 1.015 05/04/2017 1053   PHURINE 6.0 05/04/2017 1053   GLUCOSEU NEGATIVE 05/04/2017 1053   HGBUR MODERATE (A) 05/04/2017 1053   BILIRUBINUR NEGATIVE 05/04/2017 1053    KETONESUR NEGATIVE 05/04/2017 1053   PROTEINUR >=300 (A) 05/04/2017 1053   NITRITE NEGATIVE 05/04/2017 1053   LEUKOCYTESUR LARGE (A) 05/04/2017 1053   Sepsis Labs Invalid input(s): PROCALCITONIN,  WBC,  LACTICIDVEN Microbiology No results found for this or any previous visit (from the past 240 hour(s)).   Time coordinating discharge: Over 30 minutes  SIGNED:   Eddie North, MD  [facility] 12/03/2017, 12:18 PM Pager   If 7PM-7AM, please contact night-coverage [email or web address] Password [email or web address]

## 2017-12-03 NOTE — Progress Notes (Signed)
Notified by telemetry that patient had several pauses, longest being 2 seconds.Currently receiving dialysis. MD notified, will continue to monitor.

## 2017-12-03 NOTE — Care Management Note (Signed)
Case Management Note  Patient Details  Name: Patricia Ayala MRN: [id number] Date of Birth: 09/06/1929   If discussed at Long Length of Stay Meetings, dates discussed:   12/03/2017 Additional Comments:  Raydel Hosick, Chrystine Oiler, RN 12/03/2017, 12:26 PM

## 2017-12-03 NOTE — Progress Notes (Signed)
ANTICOAGULATION CONSULT NOTE - follow up  Pharmacy Consult for HEPARIN bridge with coumadin Indication: VTE treatment  Allergies  Allergen Reactions  . Coumadin [Warfarin Sodium] Other (See Comments)    GI BLEED > DOSE RELATED   Patient Measurements: Height: 5\' 4"  (162.6 cm) Weight: 139 lb 1.8 oz (63.1 kg) IBW/kg (Calculated) : 54.7 HEPARIN DW (KG): 67.6  Vital Signs: Temp: 98.4 F (36.9 C) (02/05 0444) Temp Source: Oral (02/05 0444) BP: 118/70 (02/05 0444) Pulse Rate: 77 (02/05 0444)  Labs: Recent Labs    12/01/17 0614 12/02/17 0552 12/03/17 0518  LABPROT 22.4* 23.9* 28.2*  INR 1.98 2.16 2.66  HEPARINUNFRC 0.59 0.63 0.69  CREATININE  --   --  7.01*   Estimated Creatinine Clearance: 4.9 mL/min (A) (by C-G formula based on SCr of 7.01 mg/dL (H)).  Medical History: Past Medical History:  Diagnosis Date  . Anemia 02/11/2012  . Anxiety   . Aortic stenosis    mild AS 10/2016 echo  . Arthritis    arthritis in neck  . Atrial fibrillation (HCC)   . CHF (congestive heart failure) (HCC)   . Chronic kidney disease    hemodialysis Tu/Th/Sa  . Dyspnea    on exertion  . GERD (gastroesophageal reflux disease)   . GI bleed 01/2017   due to gastric ulcer, taking NSAIDs while on coumadin  . H/O metabolic acidosis   . Headache   . Hyperlipidemia   . Hyperparathyroidism, secondary (HCC)   . Hypertension    Dr. Regino Schultze, [place], [place]  . Peptic ulcer   . Peripheral vascular disease (HCC)   . Vitamin D deficiency    Medications:  Medications Prior to Admission  Medication Sig Dispense Refill Last Dose  . ALPRAZolam (XANAX) 0.5 MG tablet Take 0.5 mg by mouth 3 (three) times daily as needed for anxiety.   11/22/2017 at Unknown time  . diltiazem (CARDIZEM) 60 MG tablet Take 60 mg by mouth daily.   11/23/2017 at Unknown time  . doxycycline (VIBRA-TABS) 100 MG tablet Take 1 tablet by mouth 2 (two) times daily.   11/24/2017 at Unknown time  . HYDROcodone-acetaminophen (NORCO)  10-325 MG tablet Take 1 tablet by mouth 4 (four) times daily as needed for moderate pain.    11/24/2017 at Unknown time  . hydrOXYzine (ATARAX/VISTARIL) 25 MG tablet Take 1 tablet by mouth 4 (four) times daily as needed for itching.   11/24/2017 at Unknown time  . metoprolol tartrate (LOPRESSOR) 25 MG tablet Take 25 mg by mouth 2 (two) times daily.   11/24/2017 at 0730  . multivitamin (RENA-VIT) TABS tablet Take 1 tablet by mouth daily.   11/23/2017 at Unknown time  . promethazine (PHENERGAN) 25 MG tablet Take 25 mg by mouth every 6 (six) hours as needed for nausea or vomiting.   unknown  . sevelamer (RENAGEL) 800 MG tablet Take 1,600 mg by mouth 3 (three) times daily with meals.   11/23/2017 at Unknown time   Assessment: Heparin level therapeutic this am  INR rising, 2.66 today. She was off coumadin due to GI bleed prior to admission.  Heparin level is at goal and rising.    Goal of Therapy:  INR 2-3 Heparin level 0.3-0.7 units/ml Monitor platelets by anticoagulation protocol: Yes   Plan:   Coumadin 2 mg today x 1 dose  PT-INR daily  Reduce Heparin to 800 units/hr (d/c when appropriate)  CBC and heparin level daily while on Heparin  Thanks for allowing pharmacy to be a part  of this patient's care.  Valrie Hart, PharmD Clinical Pharmacist Pager:  [phone] 12/03/2017

## 2017-12-03 NOTE — Progress Notes (Signed)
Subjective: Interval History: Patient offers no complaints.  States she is weak but improving.  Objective: Vital signs in last 24 hours: Temp:  [98 F (36.7 C)-98.5 F (36.9 C)] 98.4 F (36.9 C) (02/05 0444) Pulse Rate:  [77-99] 77 (02/05 0444) Resp:  [20] 20 (02/05 0444) BP: (107-118)/(46-70) 118/70 (02/05 0444) SpO2:  [97 %-99 %] 98 % (02/05 0444) Weight:  [63.1 kg (139 lb 1.8 oz)] 63.1 kg (139 lb 1.8 oz) (02/05 0444) Weight change:   Intake/Output from previous day: 02/04 0701 - 02/05 0700 In: 729 [P.O.:540; I.V.:189] Out: -  Intake/Output this shift: No intake/output data recorded.  Generally patient is alert and in no apparent distress Chest is clear to auscultation Heart exam revealed iregular rate and rhythm no murmur Extremities: She has trace  edema bilaterally.  Lab Results: No results for input(s): WBC, HGB, HCT, PLT in the last 72 hours. BMET:  Recent Labs    12/03/17 0518  NA 138  K 3.5  CL 98*  CO2 26  GLUCOSE 104*  BUN 30*  CREATININE 7.01*  CALCIUM 10.5*   No results for input(s): PTH in the last 72 hours. Iron Studies: No results for input(s): IRON, TIBC, TRANSFERRIN, FERRITIN in the last 72 hours.  Studies/Results: No results found.  I have reviewed the patient's current medications.  Assessment/Plan: 1] history of DVT/PE: Presently patient is being transitioned to Coumadin.  Her INR is 2] end-stage renal disease: She is status post hemodialysis on Saturday.  Patient is due for dialysis today.  Her potassium is normal and she does not have any uremic signs and symptoms. 3] anemia: Her hemoglobin has remained within our target goal. 4] bone and mineral disorder: Her calcium is high but her phosphorus is range..  She is on a binder. 5] fluid management: No sign of fluid overload and patient denies any difficulty breathing. 6] atrial fibrillation: Her heart rate is controlled.   Plan: 1] patient will be dialyzed today for 3-1/2 hours.  Her next  dialysis would be on Thursday which is going to be done as an outpatient. 2] we will start her on Sensipar 30 mg p.o. nightly after meal.  LOS: 9 days   Patricia Ayala S 12/03/2017,8:36 AM

## 2017-12-05 DIAGNOSIS — N186 End stage renal disease: Secondary | ICD-10-CM | POA: Diagnosis not present

## 2017-12-05 DIAGNOSIS — Z992 Dependence on renal dialysis: Secondary | ICD-10-CM | POA: Diagnosis not present

## 2017-12-06 DIAGNOSIS — L03032 Cellulitis of left toe: Secondary | ICD-10-CM | POA: Diagnosis not present

## 2017-12-06 DIAGNOSIS — I82409 Acute embolism and thrombosis of unspecified deep veins of unspecified lower extremity: Secondary | ICD-10-CM | POA: Diagnosis not present

## 2017-12-06 DIAGNOSIS — K9289 Other specified diseases of the digestive system: Secondary | ICD-10-CM | POA: Diagnosis not present

## 2017-12-06 DIAGNOSIS — H6192 Disorder of left external ear, unspecified: Secondary | ICD-10-CM | POA: Diagnosis not present

## 2017-12-07 DIAGNOSIS — Z992 Dependence on renal dialysis: Secondary | ICD-10-CM | POA: Diagnosis not present

## 2017-12-07 DIAGNOSIS — N186 End stage renal disease: Secondary | ICD-10-CM | POA: Diagnosis not present

## 2017-12-10 DIAGNOSIS — Z992 Dependence on renal dialysis: Secondary | ICD-10-CM | POA: Diagnosis not present

## 2017-12-10 DIAGNOSIS — N186 End stage renal disease: Secondary | ICD-10-CM | POA: Diagnosis not present

## 2017-12-12 DIAGNOSIS — Z992 Dependence on renal dialysis: Secondary | ICD-10-CM | POA: Diagnosis not present

## 2017-12-12 DIAGNOSIS — N186 End stage renal disease: Secondary | ICD-10-CM | POA: Diagnosis not present

## 2017-12-13 DIAGNOSIS — D689 Coagulation defect, unspecified: Secondary | ICD-10-CM | POA: Diagnosis not present

## 2017-12-14 DIAGNOSIS — N186 End stage renal disease: Secondary | ICD-10-CM | POA: Diagnosis not present

## 2017-12-14 DIAGNOSIS — Z992 Dependence on renal dialysis: Secondary | ICD-10-CM | POA: Diagnosis not present

## 2017-12-17 DIAGNOSIS — N186 End stage renal disease: Secondary | ICD-10-CM | POA: Diagnosis not present

## 2017-12-17 DIAGNOSIS — Z992 Dependence on renal dialysis: Secondary | ICD-10-CM | POA: Diagnosis not present

## 2017-12-19 DIAGNOSIS — N186 End stage renal disease: Secondary | ICD-10-CM | POA: Diagnosis not present

## 2017-12-19 DIAGNOSIS — Z992 Dependence on renal dialysis: Secondary | ICD-10-CM | POA: Diagnosis not present

## 2017-12-21 DIAGNOSIS — Z992 Dependence on renal dialysis: Secondary | ICD-10-CM | POA: Diagnosis not present

## 2017-12-21 DIAGNOSIS — N186 End stage renal disease: Secondary | ICD-10-CM | POA: Diagnosis not present

## 2017-12-24 DIAGNOSIS — Z992 Dependence on renal dialysis: Secondary | ICD-10-CM | POA: Diagnosis not present

## 2017-12-24 DIAGNOSIS — N186 End stage renal disease: Secondary | ICD-10-CM | POA: Diagnosis not present

## 2017-12-26 DIAGNOSIS — N186 End stage renal disease: Secondary | ICD-10-CM | POA: Diagnosis not present

## 2017-12-26 DIAGNOSIS — Z992 Dependence on renal dialysis: Secondary | ICD-10-CM | POA: Diagnosis not present

## 2017-12-27 ENCOUNTER — Ambulatory Visit (HOSPITAL_COMMUNITY)
Admission: RE | Admit: 2017-12-27 | Discharge: 2017-12-27 | Disposition: A | Discharge status: Home / Self Care | Payer: Medicare Other | Source: Ambulatory Visit | Attending: Internal Medicine | Admitting: Internal Medicine

## 2017-12-27 ENCOUNTER — Other Ambulatory Visit (HOSPITAL_COMMUNITY): Payer: Self-pay | Admitting: Internal Medicine

## 2017-12-27 DIAGNOSIS — M7989 Other specified soft tissue disorders: Secondary | ICD-10-CM | POA: Diagnosis not present

## 2017-12-27 DIAGNOSIS — M79676 Pain in unspecified toe(s): Secondary | ICD-10-CM | POA: Diagnosis not present

## 2017-12-27 DIAGNOSIS — M85872 Other specified disorders of bone density and structure, left ankle and foot: Secondary | ICD-10-CM | POA: Diagnosis not present

## 2017-12-27 DIAGNOSIS — L03032 Cellulitis of left toe: Secondary | ICD-10-CM | POA: Diagnosis not present

## 2017-12-27 DIAGNOSIS — I970 Postcardiotomy syndrome: Secondary | ICD-10-CM | POA: Diagnosis not present

## 2017-12-27 DIAGNOSIS — R600 Localized edema: Secondary | ICD-10-CM | POA: Diagnosis not present

## 2017-12-28 DIAGNOSIS — N186 End stage renal disease: Secondary | ICD-10-CM | POA: Diagnosis not present

## 2017-12-28 DIAGNOSIS — Z992 Dependence on renal dialysis: Secondary | ICD-10-CM | POA: Diagnosis not present

## 2017-12-30 DIAGNOSIS — Z7901 Long term (current) use of anticoagulants: Secondary | ICD-10-CM | POA: Diagnosis not present

## 2017-12-30 DIAGNOSIS — R2681 Unsteadiness on feet: Secondary | ICD-10-CM | POA: Diagnosis not present

## 2017-12-31 DIAGNOSIS — Z992 Dependence on renal dialysis: Secondary | ICD-10-CM | POA: Diagnosis not present

## 2017-12-31 DIAGNOSIS — N186 End stage renal disease: Secondary | ICD-10-CM | POA: Diagnosis not present

## 2018-01-01 DIAGNOSIS — M79673 Pain in unspecified foot: Secondary | ICD-10-CM | POA: Diagnosis not present

## 2018-01-01 DIAGNOSIS — I739 Peripheral vascular disease, unspecified: Secondary | ICD-10-CM | POA: Diagnosis not present

## 2018-01-01 DIAGNOSIS — L97521 Non-pressure chronic ulcer of other part of left foot limited to breakdown of skin: Secondary | ICD-10-CM | POA: Diagnosis not present

## 2018-01-01 DIAGNOSIS — I700 Atherosclerosis of aorta: Secondary | ICD-10-CM | POA: Diagnosis not present

## 2018-01-02 DIAGNOSIS — Z992 Dependence on renal dialysis: Secondary | ICD-10-CM | POA: Diagnosis not present

## 2018-01-02 DIAGNOSIS — N186 End stage renal disease: Secondary | ICD-10-CM | POA: Diagnosis not present

## 2018-01-07 DIAGNOSIS — N186 End stage renal disease: Secondary | ICD-10-CM | POA: Diagnosis not present

## 2018-01-07 DIAGNOSIS — Z992 Dependence on renal dialysis: Secondary | ICD-10-CM | POA: Diagnosis not present

## 2018-01-09 DIAGNOSIS — N186 End stage renal disease: Secondary | ICD-10-CM | POA: Diagnosis not present

## 2018-01-09 DIAGNOSIS — Z992 Dependence on renal dialysis: Secondary | ICD-10-CM | POA: Diagnosis not present

## 2018-01-11 DIAGNOSIS — Z992 Dependence on renal dialysis: Secondary | ICD-10-CM | POA: Diagnosis not present

## 2018-01-11 DIAGNOSIS — N186 End stage renal disease: Secondary | ICD-10-CM | POA: Diagnosis not present

## 2018-01-16 DIAGNOSIS — N186 End stage renal disease: Secondary | ICD-10-CM | POA: Diagnosis not present

## 2018-01-16 DIAGNOSIS — Z992 Dependence on renal dialysis: Secondary | ICD-10-CM | POA: Diagnosis not present

## 2018-01-18 DIAGNOSIS — Z992 Dependence on renal dialysis: Secondary | ICD-10-CM | POA: Diagnosis not present

## 2018-01-18 DIAGNOSIS — N186 End stage renal disease: Secondary | ICD-10-CM | POA: Diagnosis not present

## 2018-01-19 ENCOUNTER — Encounter (HOSPITAL_COMMUNITY): Payer: Self-pay | Admitting: Emergency Medicine

## 2018-01-19 ENCOUNTER — Emergency Department (HOSPITAL_COMMUNITY)
Admission: EM | Admit: 2018-01-19 | Discharge: 2018-01-19 | Disposition: A | Discharge status: Home / Self Care | Payer: Medicare Other | Attending: Emergency Medicine | Admitting: Emergency Medicine

## 2018-01-19 DIAGNOSIS — I5032 Chronic diastolic (congestive) heart failure: Secondary | ICD-10-CM | POA: Insufficient documentation

## 2018-01-19 DIAGNOSIS — E785 Hyperlipidemia, unspecified: Secondary | ICD-10-CM | POA: Diagnosis not present

## 2018-01-19 DIAGNOSIS — I132 Hypertensive heart and chronic kidney disease with heart failure and with stage 5 chronic kidney disease, or end stage renal disease: Secondary | ICD-10-CM | POA: Insufficient documentation

## 2018-01-19 DIAGNOSIS — I4891 Unspecified atrial fibrillation: Secondary | ICD-10-CM | POA: Insufficient documentation

## 2018-01-19 DIAGNOSIS — Z79899 Other long term (current) drug therapy: Secondary | ICD-10-CM | POA: Diagnosis not present

## 2018-01-19 DIAGNOSIS — M79672 Pain in left foot: Secondary | ICD-10-CM | POA: Insufficient documentation

## 2018-01-19 DIAGNOSIS — I872 Venous insufficiency (chronic) (peripheral): Secondary | ICD-10-CM | POA: Diagnosis not present

## 2018-01-19 DIAGNOSIS — I739 Peripheral vascular disease, unspecified: Secondary | ICD-10-CM | POA: Diagnosis not present

## 2018-01-19 DIAGNOSIS — Z992 Dependence on renal dialysis: Secondary | ICD-10-CM | POA: Insufficient documentation

## 2018-01-19 DIAGNOSIS — Z7401 Bed confinement status: Secondary | ICD-10-CM | POA: Diagnosis not present

## 2018-01-19 DIAGNOSIS — N189 Chronic kidney disease, unspecified: Secondary | ICD-10-CM

## 2018-01-19 DIAGNOSIS — Z86718 Personal history of other venous thrombosis and embolism: Secondary | ICD-10-CM | POA: Diagnosis not present

## 2018-01-19 DIAGNOSIS — M79671 Pain in right foot: Secondary | ICD-10-CM | POA: Diagnosis not present

## 2018-01-19 DIAGNOSIS — N186 End stage renal disease: Secondary | ICD-10-CM

## 2018-01-19 DIAGNOSIS — Z7901 Long term (current) use of anticoagulants: Secondary | ICD-10-CM | POA: Insufficient documentation

## 2018-01-19 DIAGNOSIS — D631 Anemia in chronic kidney disease: Secondary | ICD-10-CM | POA: Insufficient documentation

## 2018-01-19 DIAGNOSIS — R791 Abnormal coagulation profile: Secondary | ICD-10-CM

## 2018-01-19 DIAGNOSIS — R404 Transient alteration of awareness: Secondary | ICD-10-CM | POA: Diagnosis not present

## 2018-01-19 DIAGNOSIS — I998 Other disorder of circulatory system: Secondary | ICD-10-CM

## 2018-01-19 DIAGNOSIS — I120 Hypertensive chronic kidney disease with stage 5 chronic kidney disease or end stage renal disease: Secondary | ICD-10-CM | POA: Diagnosis not present

## 2018-01-19 DIAGNOSIS — R531 Weakness: Secondary | ICD-10-CM | POA: Diagnosis not present

## 2018-01-19 DIAGNOSIS — R279 Unspecified lack of coordination: Secondary | ICD-10-CM | POA: Diagnosis not present

## 2018-01-19 LAB — CBC WITH DIFFERENTIAL/PLATELET
BASOS ABS: 0 10*3/uL (ref 0.0–0.1)
Basophils Relative: 0 %
Eosinophils Absolute: 0 10*3/uL (ref 0.0–0.7)
Eosinophils Relative: 0 %
HEMATOCRIT: 34.9 % — AB (ref 36.0–46.0)
HEMOGLOBIN: 11 g/dL — AB (ref 12.0–15.0)
LYMPHS PCT: 8 %
Lymphs Abs: 1.1 10*3/uL (ref 0.7–4.0)
MCH: 28.9 pg (ref 26.0–34.0)
MCHC: 31.5 g/dL (ref 30.0–36.0)
MCV: 91.8 fL (ref 78.0–100.0)
Monocytes Absolute: 1 10*3/uL (ref 0.1–1.0)
Monocytes Relative: 8 %
Neutro Abs: 10.5 10*3/uL — ABNORMAL HIGH (ref 1.7–7.7)
Neutrophils Relative %: 84 %
PLATELETS: 238 10*3/uL (ref 150–400)
RBC: 3.8 MIL/uL — AB (ref 3.87–5.11)
RDW: 14.5 % (ref 11.5–15.5)
WBC: 12.6 10*3/uL — AB (ref 4.0–10.5)

## 2018-01-19 LAB — I-STAT CG4 LACTIC ACID, ED: Lactic Acid, Venous: 1.69 mmol/L (ref 0.5–1.9)

## 2018-01-19 LAB — BASIC METABOLIC PANEL
ANION GAP: 10 (ref 5–15)
BUN: 11 mg/dL (ref 6–20)
CO2: 28 mmol/L (ref 22–32)
Calcium: 9.5 mg/dL (ref 8.9–10.3)
Chloride: 99 mmol/L — ABNORMAL LOW (ref 101–111)
Creatinine, Ser: 3.14 mg/dL — ABNORMAL HIGH (ref 0.44–1.00)
GFR calc Af Amer: 14 mL/min — ABNORMAL LOW (ref >60)
GFR, EST NON AFRICAN AMERICAN: 12 mL/min — AB (ref >60)
Glucose, Bld: 103 mg/dL — ABNORMAL HIGH (ref 65–99)
POTASSIUM: 3.4 mmol/L — AB (ref 3.5–5.1)
Sodium: 137 mmol/L (ref 135–145)

## 2018-01-19 LAB — PROTIME-INR
INR: 1.08
Prothrombin Time: 13.9 seconds (ref 11.4–15.2)

## 2018-01-19 MED ORDER — MORPHINE SULFATE (PF) 4 MG/ML IV SOLN
4.0000 mg | Freq: Once | INTRAVENOUS | Status: AC
  Administered 2018-01-19: 4 mg via INTRAVENOUS
  Filled 2018-01-19: qty 1

## 2018-01-19 MED ORDER — HYDROMORPHONE HCL 2 MG PO TABS
2.0000 mg | ORAL_TABLET | Freq: Once | ORAL | Status: AC
  Administered 2018-01-19: 2 mg via ORAL
  Filled 2018-01-19: qty 1

## 2018-01-19 MED ORDER — HYDROMORPHONE HCL 2 MG PO TABS: 2.0000 mg | ORAL_TABLET | ORAL | 0 refills | Status: DC | PRN

## 2018-01-19 MED ORDER — HYDROMORPHONE HCL 4 MG PO TABS: 2.0000 mg | ORAL_TABLET | ORAL | 0 refills | Status: DC | PRN

## 2018-01-19 NOTE — ED Provider Notes (Signed)
[facility] EMERGENCY DEPARTMENT Provider Note   CSN: [id number] Arrival date & time: 01/19/18  0120     History   Chief Complaint Chief Complaint  Patient presents with  . Foot Pain    HPI Patricia Ayala is a 84 y.o. female.   The history is provided by the patient.  She has history of hypertension, hyperlipidemia, CHF, atrial fibrillation, end-stage renal disease on hemodialysis, history of GI bleed, DVT currently anticoagulated on warfarin and comes in complaining of bilateral foot pain.  She has been having foot pain with palpation or ambulation for about the last month.  However, tonight, pain was bad enough that she was unable to bear weight.  Pain is present in both feet.  Of note, she has a black area on the left first toe which she states has been there for 2 weeks.  She rates her pain a 10/10.  She has not done anything to treat the pain.  Past Medical History:  Diagnosis Date  . Anemia 02/11/2012  . Anxiety   . Aortic stenosis    mild AS 10/2016 echo  . Arthritis    arthritis in neck  . Atrial fibrillation (HCC)   . CHF (congestive heart failure) (HCC)   . Chronic kidney disease    hemodialysis Tu/Th/Sa  . Dyspnea    on exertion  . GERD (gastroesophageal reflux disease)   . GI bleed 01/2017   due to gastric ulcer, taking NSAIDs while on coumadin  . H/O metabolic acidosis   . Headache   . Hyperlipidemia   . Hyperparathyroidism, secondary (HCC)   . Hypertension    Dr. Regino Schultze, [place], [place]  . Peptic ulcer   . Peripheral vascular disease (HCC)   . Vitamin D deficiency     Patient Active Problem List   Diagnosis Date Noted  . Goals of care, counseling/discussion   . Palliative care encounter   . DNR (do not resuscitate) discussion   . Extensive DVT of lower extremity, bilateral (HCC) 11/24/2017  . Delirium, acute 05/04/2017  . Delirium 05/04/2017  . H/o Helicobacter pylori ab+   . H/o Chronic gastric ulcer with hemorrhage 01/2017   . Acute  post-traumatic headache, not intractable   . Acute blood loss anemia   . Chronic anticoagulation   . Encounter for therapeutic drug monitoring 12/03/2016  . Generalized anxiety disorder 11/12/2016  . Atrial fibrillation with RVR (HCC) 11/01/2016  . Acute respiratory failure (HCC) 11/01/2016  . URI (upper respiratory infection) 10/30/2016  . ESRD (end stage renal disease) (HCC) 10/29/2016  . Benign essential HTN 10/29/2016  . HLD (hyperlipidemia) 10/29/2016  . Acute on chronic diastolic CHF (congestive heart failure) (HCC) 10/29/2016  . Pulmonary edema 09/26/2016  . Mechanical complication of other vascular device, implant, and graft 08/07/2013  . Anemia 02/11/2012    Past Surgical History:  Procedure Laterality Date  . A/V FISTULAGRAM N/A 05/10/2017   Procedure: A/V Fistulagram;  Surgeon: Sherren Kerns, MD;  Location: [facility] INVASIVE CV LAB;  Service: Cardiovascular;  Laterality: N/A;  . A/V FISTULAGRAM Left 11/18/2017   Procedure: A/V FISTULAGRAM;  Surgeon: Fransisco Hertz, MD;  Location: [facility] INVASIVE CV LAB;  Service: Cardiovascular;  Laterality: Left;  . ABDOMINAL HYSTERECTOMY    . AV FISTULA PLACEMENT  04/01/2012   Procedure: ARTERIOVENOUS (AV) FISTULA CREATION;  Surgeon: Sherren Kerns, MD;  Location: [facility] OR;  Service: Vascular;  Laterality: Left;  . AV FISTULA PLACEMENT Left 06/24/2017   Procedure: ARTERIOVENOUS (AV) FISTULA CREATION;  Surgeon: Maeola Harman, MD;  Location: [facility] OR;  Service: Vascular;  Laterality: Left;  . BASCILIC VEIN TRANSPOSITION Left 08/26/2017   Procedure: BASILIC VEIN TRANSPOSITION SECOND STAGE;  Surgeon: Maeola Harman, MD;  Location: [facility] OR;  Service: Vascular;  Laterality: Left;  . BIOPSY  11/26/2017   Procedure: BIOPSY;  Surgeon: West Bali, MD;  Location: [facility];  Service: Endoscopy;;  gastric   . CHOLECYSTECTOMY    . COLON SURGERY     for a blockage  . ESOPHAGOGASTRODUODENOSCOPY N/A 02/17/2017   Dr. Myrtie Neither: normal  esophagus, red blood in gastric fundus and body, non-bleeding gastric ulcer with visible vessel s/p injection, clip, and cautery, normal duodenum, no specimens collected. H.pylori serology positive   . ESOPHAGOGASTRODUODENOSCOPY N/A 11/26/2017   Procedure: ESOPHAGOGASTRODUODENOSCOPY (EGD);  Surgeon: West Bali, MD;  Location: [facility] ENDO [facility]ITE;  Service: Endoscopy;  Laterality: N/A;  . EYE SURGERY     bilateral cataracts, /w IOL - 2013  . FISTULOGRAM N/A 11/05/2014   Procedure: FISTULOGRAM;  Surgeon: Larina Earthly, MD;  Location: [facility] CATH LAB;  Service: Cardiovascular;  Laterality: N/A;  . INSERTION OF DIALYSIS CATHETER  04/10/2012   Procedure: INSERTION OF DIALYSIS CATHETER;  Surgeon: Pryor Ochoa, MD;  Location: [facility] OR;  Service: Vascular;  Laterality: N/A;  Insertion Diatek Catheter Right Internal Jugular  . LIGATION OF COMPETING BRANCHES OF ARTERIOVENOUS FISTULA  11/07/2012   Procedure: LIGATION OF COMPETING BRANCHES OF ARTERIOVENOUS FISTULA;  Surgeon: Chuck Hint, MD;  Location: [facility] OR;  Service: Vascular;  Laterality: Left;  Brachio/Cephalic Fistula  . SHUNTOGRAM N/A 11/03/2012   Procedure: Betsey Amen;  Surgeon: Chuck Hint, MD;  Location: [facility] CATH LAB;  Service: Cardiovascular;  Laterality: N/A;  . SHUNTOGRAM Left 03/09/2013   Procedure: Fistulogram;  Surgeon: Fransisco Hertz, MD;  Location: [facility] CATH LAB;  Service: Cardiovascular;  Laterality: Left;  . SHUNTOGRAM Left 11/23/2013   Procedure: FISTULOGRAM;  Surgeon: Fransisco Hertz, MD;  Location: [facility] CATH LAB;  Service: Cardiovascular;  Laterality: Left;     OB History   None      Home Medications    Prior to Admission medications   Medication Sig Start Date End Date Taking? Authorizing Provider  ALPRAZolam Prudy Feeler) 0.5 MG tablet Take 0.5 mg by mouth 3 (three) times daily as needed for anxiety.    [provider]  diltiazem (CARDIZEM) 60 MG tablet Take 60 mg by mouth daily.    [provider]    HYDROcodone-acetaminophen (NORCO) 10-325 MG tablet Take 1 tablet by mouth 4 (four) times daily as needed for moderate pain.     [provider]  hydrOXYzine (ATARAX/VISTARIL) 25 MG tablet Take 1 tablet by mouth 4 (four) times daily as needed for itching. 11/21/17   [provider]  metoprolol tartrate (LOPRESSOR) 25 MG tablet Take 2 tablets (50 mg total) by mouth 2 (two) times daily. 12/03/17   Dhungel, Theda Belfast, MD  multivitamin (RENA-VIT) TABS tablet Take 1 tablet by mouth daily.    [provider]  promethazine (PHENERGAN) 25 MG tablet Take 25 mg by mouth every 6 (six) hours as needed for nausea or vomiting.    [provider]  sevelamer (RENAGEL) 800 MG tablet Take 1,600 mg by mouth 3 (three) times daily with meals.    [provider]  warfarin (COUMADIN) 2.5 MG tablet Take 1 tablet (2.5 mg total) by mouth daily at 6 PM. 12/04/17   Dhungel, Theda Belfast, MD  Family History Family History  Problem Relation Age of Onset  . Stroke Mother   . Cancer Father   . Heart attack Father   . Colon cancer Neg Hx     Social History Social History   Tobacco Use  . Smoking status: Never Smoker  . Smokeless tobacco: Never Used  Substance Use Topics  . Alcohol use: No  . Drug use: No     Allergies   Coumadin [warfarin sodium]   Review of Systems Review of Systems  All other systems reviewed and are negative.    Physical Exam Updated Vital Signs BP 104/81 (BP Location: Right Arm)   Pulse (!) 109   Temp 98.1 F (36.7 C) (Oral)   Resp 20   Ht 5\' 7"  (1.702 m)   Wt 61.7 kg (136 lb)   SpO2 99%   BMI 21.30 kg/m    Physical Exam  Nursing note and vitals reviewed.  84 year old female, resting comfortably and in no acute distress. Vital signs are significant for elevated heart rate. Oxygen saturation is 99%, which is normal. Head is normocephalic and atraumatic. PERRLA, EOMI. Oropharynx is clear. Neck is nontender and supple without adenopathy  or JVD. Back is nontender and there is no CVA tenderness. Lungs are clear without rales, wheezes, or rhonchi. Chest: Dialysis access catheters present on the right side. Heart has regular rate and rhythm without murmur. Abdomen is soft, flat, nontender without masses or hepatosplenomegaly and peristalsis is normoactive. Extremities: 2-3+ pretibial and pedal edema.  Both legs are cool starting in the proximal calf and are cold starting just superior to the ankle.  Both feet are cold and very tender to palpation.  I am unable to palpate posterior tibial or dorsalis pedis pulses, and am unable to identify them with Doppler.  Left first toe is necrotic.  Unable to assess capillary refill. Skin is warm and dry without rash. Neurologic: Mental status is normal, cranial nerves are intact, there are no motor or sensory deficits.  ED Treatments / Results  Labs (all labs ordered are listed, but only abnormal results are displayed) Labs Reviewed  BASIC METABOLIC PANEL - Abnormal; Notable for the following components:      Result Value   Potassium 3.4 (*)    Chloride 99 (*)    Glucose, Bld 103 (*)    Creatinine, Ser 3.14 (*)    GFR calc non Af Amer 12 (*)    GFR calc Af Amer 14 (*)    All other components within normal limits  CBC WITH DIFFERENTIAL/PLATELET - Abnormal; Notable for the following components:   WBC 12.6 (*)    RBC 3.80 (*)    Hemoglobin 11.0 (*)    HCT 34.9 (*)    Neutro Abs 10.5 (*)    All other components within normal limits  PROTIME-INR  I-STAT CG4 LACTIC ACID, ED    Procedures Procedures (including critical care time)  Medications Ordered in ED Medications  HYDROmorphone (DILAUDID) tablet 2 mg (has no administration in time range)  morphine 4 MG/ML injection 4 mg (4 mg Intravenous Given 01/19/18 0228)     Initial Impression / Assessment and Plan / ED Course  I have reviewed the triage vital signs and the nursing notes.  Pertinent labs & imaging results that were  available during my care of the patient were reviewed by me and considered in my medical decision making (see chart for details).  Bilateral foot pain in patient with obvious signs of  vascular insufficiency.  I do not see evidence of acute vascular occlusion.  This is most likely small vessel disease.  Will check screening labs including lactic acid.  Old records are reviewed showing a recent hospitalization for DVT, and initiation of warfarin therapy.  Laboratory workup is unremarkable.  WBC is mildly elevated, but with normal differential.  Lactic acid level is normal.  INR is subtherapeutic and this will need to be addressed through the warfarin clinic.  Her husband has arrived and is concerned about being able to transport her to [facility] and wants her transferred to [facility].  Advised that there is no indication for transfer tonight.  However, pain control is an issue.  She is on a high dose of hydrocodone-acetaminophen.  Will try switching to hydromorphone orally.  She is scheduled to see her vascular surgeon tomorrow.  She is to keep that appointment.  At that time, he can address any additional needs for evaluation.  I am concerned about long-term viability of her feet in light of developing gangrene.  However, no indication for hospitalization tonight.  Final Clinical Impressions(s) / ED Diagnoses   Final diagnoses:  Foot pain, bilateral  Vascular insufficiency of extremity  Subtherapeutic international normalized ratio (INR)  End-stage renal disease on hemodialysis (HCC)  Anemia associated with chronic renal failure    ED Discharge Orders        Ordered    HYDROmorphone (DILAUDID) 4 MG tablet  Every 4 hours PRN,   Status:  Discontinued     01/19/18 0316    HYDROmorphone (DILAUDID) 2 MG tablet  Every 4 hours PRN     01/19/18 0317      Dione Booze, MD 01/19/18 [id number]

## 2018-01-19 NOTE — Discharge Instructions (Addendum)
Your INR (blood test for warfarin) was low tonight - 1.08 (it should be 2.0-3.0). Call the clinic that manages your warfarin dose to see how they want you to change your dose.  See your vascular specialist on Monday, as scheduled. He will work with you regarding the circulation in your feet.

## 2018-01-19 NOTE — ED Notes (Signed)
EMS at bedside to transport pt home.

## 2018-01-19 NOTE — ED Triage Notes (Signed)
Pt brought in by [facility] for bilateral foot pain. Pt with eschar to R heel and has a black R great toe.

## 2018-01-20 ENCOUNTER — Other Ambulatory Visit: Payer: Self-pay

## 2018-01-20 ENCOUNTER — Other Ambulatory Visit: Payer: Self-pay | Admitting: *Deleted

## 2018-01-20 ENCOUNTER — Encounter: Payer: Self-pay | Admitting: *Deleted

## 2018-01-20 ENCOUNTER — Encounter: Payer: Self-pay | Admitting: Vascular Surgery

## 2018-01-20 ENCOUNTER — Ambulatory Visit (INDEPENDENT_AMBULATORY_CARE_PROVIDER_SITE_OTHER): Payer: Medicare Other | Admitting: Vascular Surgery

## 2018-01-20 DIAGNOSIS — I70261 Atherosclerosis of native arteries of extremities with gangrene, right leg: Secondary | ICD-10-CM | POA: Diagnosis not present

## 2018-01-20 DIAGNOSIS — I70269 Atherosclerosis of native arteries of extremities with gangrene, unspecified extremity: Secondary | ICD-10-CM | POA: Insufficient documentation

## 2018-01-20 NOTE — Progress Notes (Signed)
Requested by:  [name], MD [address] [place], [place] [address]  Reason for consultation: L foot ganrene   History of Present Illness   Patricia Ayala is a 84 y.o. (08-Jul-1929) female who presents with chief complaint: B foot pain.  Onset of symptoms occurred 2 weeks ago.  Pain is described as sharp, severity 10/10, and associated with rest and walking and manipulating L foot.  Patient notes pain extends up to L calf.  Patient has attempted to treat this pain with narcotics.  The patient has rest pain symptoms.  The patient notes also some serous drainage from the L>R foot.  Pt is well known to practice from her hemodialysis surgeries.  Atherosclerotic risk factors include: HLD,.  Past Medical History:  Diagnosis Date  . Anemia 02/11/2012  . Anxiety   . Aortic stenosis    mild AS 10/2016 echo  . Arthritis    arthritis in neck  . Atrial fibrillation (HCC)   . CHF (congestive heart failure) (HCC)   . Chronic kidney disease    hemodialysis Tu/Th/Sa  . Dyspnea    on exertion  . GERD (gastroesophageal reflux disease)   . GI bleed 01/2017   due to gastric ulcer, taking NSAIDs while on coumadin  . H/O metabolic acidosis   . Headache   . Hyperlipidemia   . Hyperparathyroidism, secondary (HCC)   . Hypertension    Dr. Regino Schultze, [place], [place]  . Peptic ulcer   . Peripheral vascular disease (HCC)   . Vitamin D deficiency     Past Surgical History:  Procedure Laterality Date  . A/V FISTULAGRAM N/A 05/10/2017   Procedure: A/V Fistulagram;  Surgeon: Sherren Kerns, MD;  Location: [facility] INVASIVE CV LAB;  Service: Cardiovascular;  Laterality: N/A;  . A/V FISTULAGRAM Left 11/18/2017   Procedure: A/V FISTULAGRAM;  Surgeon: Fransisco Hertz, MD;  Location: [facility] INVASIVE CV LAB;  Service: Cardiovascular;  Laterality: Left;  . ABDOMINAL HYSTERECTOMY    . AV FISTULA PLACEMENT  04/01/2012   Procedure: ARTERIOVENOUS (AV) FISTULA CREATION;  Surgeon: Sherren Kerns, MD;  Location:  [facility] OR;  Service: Vascular;  Laterality: Left;  . AV FISTULA PLACEMENT Left 06/24/2017   Procedure: ARTERIOVENOUS (AV) FISTULA CREATION;  Surgeon: Maeola Harman, MD;  Location: [facility] OR;  Service: Vascular;  Laterality: Left;  . BASCILIC VEIN TRANSPOSITION Left 08/26/2017   Procedure: BASILIC VEIN TRANSPOSITION SECOND STAGE;  Surgeon: Maeola Harman, MD;  Location: [facility] OR;  Service: Vascular;  Laterality: Left;  . BIOPSY  11/26/2017   Procedure: BIOPSY;  Surgeon: West Bali, MD;  Location: [facility] END[facility] SUITE;  Service: Endoscopy;;  gastric   . CHOLECYSTECTOMY    . COLON SURGERY     for a blockage  . ESOPHAGOGASTRODUODENOSCOPY N/A 02/17/2017   Dr. Myrtie Neither: normal esophagus, red blood in gastric fundus and body, non-bleeding gastric ulcer with visible vessel s/p injection, clip, and cautery, normal duodenum, no specimens collected. H.pylori serology positive   . ESOPHAGOGASTRODUODENOSCOPY N/A 11/26/2017   Procedure: ESOPHAGOGASTRODUODENOSCOPY (EGD);  Surgeon: West Bali, MD;  Location: [facility] SU[facility];  Service: Endoscopy;  Laterality: N/A;  . EYE SURGERY     bilateral cataracts, /w IOL - 2013  . FISTULOGRAM N/A 11/05/2014   Procedure: FISTULOGRAM;  Surgeon: Larina Earthly, MD;  Location: [facility] CATH LAB;  Service: Cardiovascular;  Laterality: N/A;  . INSERTION OF DIALYSIS CATHETER  04/10/2012   Procedure: INSERTION OF DIALYSIS CATHETER;  Surgeon: Pryor Ochoa, MD;  Location: [facility] OR;  Service: Vascular;  Laterality: N/A;  Insertion Diatek Catheter Right Internal Jugular  . LIGATION OF COMPETING BRANCHES OF ARTERIOVENOUS FISTULA  11/07/2012   Procedure: LIGATION OF COMPETING BRANCHES OF ARTERIOVENOUS FISTULA;  Surgeon: Chuck Hint, MD;  Location: [facility] OR;  Service: Vascular;  Laterality: Left;  Brachio/Cephalic Fistula  . SHUNTOGRAM N/A 11/03/2012   Procedure: Betsey Amen;  Surgeon: Chuck Hint, MD;  Location: [facility] CATH LAB;  Service: Cardiovascular;  Laterality: N/A;  .  SHUNTOGRAM Left 03/09/2013   Procedure: Fistulogram;  Surgeon: Fransisco Hertz, MD;  Location: [facility] CATH LAB;  Service: Cardiovascular;  Laterality: Left;  . SHUNTOGRAM Left 11/23/2013   Procedure: FISTULOGRAM;  Surgeon: Fransisco Hertz, MD;  Location: [facility] CATH LAB;  Service: Cardiovascular;  Laterality: Left;    Social History   Socioeconomic History  . Marital status: Married    Spouse name: Not on file  . Number of children: Not on file  . Years of education: Not on file  . Highest education level: Not on file  Occupational History  . Not on file  Social Needs  . Financial resource strain: Not on file  . Food insecurity:    Worry: Not on file    Inability: Not on file  . Transportation needs:    Medical: Not on file    Non-medical: Not on file  Tobacco Use  . Smoking status: Never Smoker  . Smokeless tobacco: Never Used  Substance and Sexual Activity  . Alcohol use: No  . Drug use: No  . Sexual activity: Not on file  Lifestyle  . Physical activity:    Days per week: Not on file    Minutes per session: Not on file  . Stress: Not on file  Relationships  . Social connections:    Talks on phone: Not on file    Gets together: Not on file    Attends religious service: Not on file    Active member of club or organization: Not on file    Attends meetings of clubs or organizations: Not on file    Relationship status: Not on file  . Intimate partner violence:    Fear of current or ex partner: Not on file    Emotionally abused: Not on file    Physically abused: Not on file    Forced sexual activity: Not on file  Other Topics Concern  . Not on file  Social History Narrative  . Not on file    Family History  Problem Relation Age of Onset  . Stroke Mother   . Cancer Father   . Heart attack Father   . Colon cancer Neg Hx     Current Outpatient Medications  Medication Sig Dispense Refill  . ALPRAZolam (XANAX) 0.5 MG tablet Take 0.5 mg by mouth 3 (three) times daily as needed  for anxiety.    Marland Kitchen diltiazem (CARDIZEM) 60 MG tablet Take 60 mg by mouth daily.    Marland Kitchen HYDROcodone-acetaminophen (NORCO) 10-325 MG tablet Take 1 tablet by mouth 4 (four) times daily as needed for moderate pain.     Marland Kitchen HYDROmorphone (DILAUDID) 2 MG tablet Take 1 tablet (2 mg total) by mouth every 4 (four) hours as needed for severe pain. 15 tablet 0  . hydrOXYzine (ATARAX/VISTARIL) 25 MG tablet Take 1 tablet by mouth 4 (four) times daily as needed for itching.    . metoprolol tartrate (LOPRESSOR) 25 MG tablet Take 2 tablets (50 mg total) by mouth 2 (  two) times daily. 30 tablet 0  . multivitamin (RENA-VIT) TABS tablet Take 1 tablet by mouth daily.    . promethazine (PHENERGAN) 25 MG tablet Take 25 mg by mouth every 6 (six) hours as needed for nausea or vomiting.    . sevelamer (RENAGEL) 800 MG tablet Take 1,600 mg by mouth 3 (three) times daily with meals.    . warfarin (COUMADIN) 2.5 MG tablet Take 1 tablet (2.5 mg total) by mouth daily at 6 PM. 30 tablet 0   No current facility-administered medications for this visit.     Allergies  Allergen Reactions  . Coumadin [Warfarin Sodium] Other (See Comments)    GI BLEED > DOSE RELATED    REVIEW OF SYSTEMS (negative unless checked):   Cardiac:  []  Chest pain or chest pressure? [x]  Shortness of breath upon activity? [x]  Shortness of breath when lying flat? [x]  Irregular heart rhythm?  Vascular:  [x]  Pain in calf, thigh, or hip brought on by walking? [x]  Pain in feet at night that wakes you up from your sleep? [x]  Blood clot in your veins? [x]  Leg swelling?  Pulmonary:  []  Oxygen at home? []  Productive cough? []  Wheezing?  Neurologic:  [x]  Sudden weakness in arms or legs? [x]  Sudden numbness in arms or legs? []  Sudden onset of difficult speaking or slurred speech? []  Temporary loss of vision in one eye? []  Problems with dizziness?  Gastrointestinal:  []  Blood in stool? []  Vomited blood?  Genitourinary:  []  Burning when  urinating? []  Blood in urine?  Psychiatric:  []  Major depression  Hematologic:  []  Bleeding problems? [x]  Problems with blood clotting?  Dermatologic:  []  Rashes or ulcers?  Constitutional:  []  Fever or chills?  Ear/Nose/Throat:  []  Change in hearing? []  Nose bleeds? []  Sore throat?  Musculoskeletal:  []  Back pain? []  Joint pain? []  Muscle pain?   For VQI Use Only   PRE-ADM LIVING Home  AMB STATUS Wheelchair  CAD Sx None  PRIOR CHF Mild  STRESS TEST No    Physical Examination     Vitals:   01/20/18 1529  BP: (!) 124/58  Pulse: 75  Resp: 20  Temp: 97.6 F (36.4 C)  TempSrc: Oral  Weight: 139 lb (63 kg)  Height: 5\' 4"  (1.626 m)   Body mass index is 23.86 kg/m.  General Alert, O x 3, WD, Ill appearing  Head [place]/AT,    Ear/Nose/ Throat Hearing grossly intact, nares without erythema or drainage, oropharynx without Erythema or Exudate, Mallampati score: 3,   Eyes PERRLA, EOMI,    Neck Supple, mid-line trachea,    Pulmonary Sym exp, good B air movt, CTA B  Cardiac RRR, Nl S1, S2, no Murmurs, No rubs, No S3,S4  Vascular Vessel Right Left  Radial Palpable Palpable  Brachial Palpable Palpable  Carotid Palpable, No Bruit Palpable, No Bruit  Aorta Not palpable N/A  Femoral Not palpable due to position in wheelchair Not palpable due to position in wheelchair  Popliteal Not palpable Not palpable  PT Not palpable Not palpable  DP Not palpable Not palpable    Gastro- intestinal soft, non-distended, non-tender to palpation, No guarding or rebound, no HSM, no masses, no CVAT B, No palpable prominent aortic pulse,    Musculo- skeletal M/S 5/5 throughout except DF/PF (pt not cooperative with testing), Extremities without ischemic changes except L great toe (see photo), Pitting edema present: B 3+, No visible varicosities , No Lipodermatosclerosis present  Neurologic Pain and light touch intact in extremities  except for decreased sensation in B feet, Motor exam as  listed above  Psychiatric Judgement intact, Mood & affect appropriate for pt's clinical situation  Dermatologic See M/S exam for extremity exam, No rashes otherwise noted  Lymphatic  Palpable lymph nodes: None       Non-Invasive Vascular Imaging   Outside ABI (01/01/18)  R:   ABI: 0.17,   L:  ABI: 0.35   Medical Decision Making   Patricia Ayala is a 84 y.o. female who presents with: BLE critical limb ischemia, BLE CVI   I discussed with the patient the natural history of critical limb ischemia: 25% require amputation in one year, 50% are able to maintain their limbs in one year, and 25-30% die in one year due to comorbidities.  Given the limb threatening status of this patient, I recommend an aggressive work up including proceeding with an: Aortogram, Bilateral runoff and possible Left leg intervention. I discussed with the patient the nature of angiographic procedures, especially the limited patencies of any endovascular intervention. The patient is aware of that the risks of an angiographic procedure include but are not limited to: bleeding, infection, access site complications, embolization, rupture of treated vessel, dissection, possible need for emergent surgical intervention, and possible need for surgical procedures to treat the patient's pathology. The patient is aware of the risks and agrees to proceed.  The procedure is scheduled for: 1 APR 19 with Dr. Randie Heinz due to need to hold Coumadin. I suspect the patient is going to need surgical intervention, so will plan on admitting afterward for Cardiac optimization.  I discussed in depth with the patient the nature of atherosclerosis, and emphasized the importance of maximal medical management including strict control of blood pressure, blood glucose, and lipid levels, antiplatelet agents, obtaining regular exercise, and cessation of smoking.  The patient is aware that without maximal medical management the underlying  atherosclerotic disease process will progress, limiting the benefit of any interventions. The patient is currently not on on statin as not medically indicated.  The patient is currently not on an anti-platelet due to bleeding risks related to use of an anticoagulant.  Pt is on Warfarin.  Thank you for allowing [place] to participate in this patient's care.   Leonides Sake, MD, FACS [facility] Office: [phone] Pager: [phone]  01/20/2018, 4:01 PM

## 2018-01-21 ENCOUNTER — Telehealth: Payer: Self-pay | Admitting: *Deleted

## 2018-01-21 DIAGNOSIS — Z992 Dependence on renal dialysis: Secondary | ICD-10-CM | POA: Diagnosis not present

## 2018-01-21 DIAGNOSIS — N186 End stage renal disease: Secondary | ICD-10-CM | POA: Diagnosis not present

## 2018-01-21 NOTE — Telephone Encounter (Signed)
Spoke with Melody at Dr. Sharyon Medicus [phone]) they will be contacting patient for Lovenox Bridge while Coumadin on hold for 5 days pre-op.

## 2018-01-22 ENCOUNTER — Telehealth: Payer: Self-pay | Admitting: *Deleted

## 2018-01-22 ENCOUNTER — Encounter: Payer: Self-pay | Admitting: Internal Medicine

## 2018-01-22 NOTE — Telephone Encounter (Signed)
Spoke to Patricia Ayala who is going to Dr. Sharyon Medicus office this am for instructions for Lovenox bridging for patient. Lisa at  office notified that he is on his way to get instructions.

## 2018-01-23 DIAGNOSIS — N186 End stage renal disease: Secondary | ICD-10-CM | POA: Diagnosis not present

## 2018-01-23 DIAGNOSIS — Z992 Dependence on renal dialysis: Secondary | ICD-10-CM | POA: Diagnosis not present

## 2018-01-24 ENCOUNTER — Encounter: Payer: Self-pay | Admitting: Internal Medicine

## 2018-01-25 DIAGNOSIS — Z992 Dependence on renal dialysis: Secondary | ICD-10-CM | POA: Diagnosis not present

## 2018-01-25 DIAGNOSIS — N186 End stage renal disease: Secondary | ICD-10-CM | POA: Diagnosis not present

## 2018-01-26 DIAGNOSIS — Z992 Dependence on renal dialysis: Secondary | ICD-10-CM | POA: Diagnosis not present

## 2018-01-26 DIAGNOSIS — N186 End stage renal disease: Secondary | ICD-10-CM | POA: Diagnosis not present

## 2018-01-27 ENCOUNTER — Observation Stay (HOSPITAL_COMMUNITY)
Admission: AD | Admit: 2018-01-27 | Discharge: 2018-01-29 | Disposition: A | Discharge status: Hospice | Payer: Medicare Other | Source: Ambulatory Visit | Attending: Internal Medicine | Admitting: Internal Medicine

## 2018-01-27 ENCOUNTER — Inpatient Hospital Stay (HOSPITAL_COMMUNITY)
Admission: AD | Disposition: A | Discharge status: Hospice | Payer: Self-pay | Source: Ambulatory Visit | Attending: Vascular Surgery

## 2018-01-27 ENCOUNTER — Encounter (HOSPITAL_COMMUNITY): Payer: Self-pay | Admitting: Internal Medicine

## 2018-01-27 DIAGNOSIS — Z992 Dependence on renal dialysis: Secondary | ICD-10-CM | POA: Diagnosis not present

## 2018-01-27 DIAGNOSIS — Z7901 Long term (current) use of anticoagulants: Secondary | ICD-10-CM | POA: Insufficient documentation

## 2018-01-27 DIAGNOSIS — I4891 Unspecified atrial fibrillation: Secondary | ICD-10-CM | POA: Diagnosis not present

## 2018-01-27 DIAGNOSIS — Z90710 Acquired absence of both cervix and uterus: Secondary | ICD-10-CM | POA: Diagnosis not present

## 2018-01-27 DIAGNOSIS — I10 Essential (primary) hypertension: Secondary | ICD-10-CM | POA: Diagnosis present

## 2018-01-27 DIAGNOSIS — I824Z3 Acute embolism and thrombosis of unspecified deep veins of distal lower extremity, bilateral: Secondary | ICD-10-CM | POA: Insufficient documentation

## 2018-01-27 DIAGNOSIS — Z8249 Family history of ischemic heart disease and other diseases of the circulatory system: Secondary | ICD-10-CM | POA: Insufficient documentation

## 2018-01-27 DIAGNOSIS — I739 Peripheral vascular disease, unspecified: Secondary | ICD-10-CM | POA: Diagnosis present

## 2018-01-27 DIAGNOSIS — Z515 Encounter for palliative care: Secondary | ICD-10-CM

## 2018-01-27 DIAGNOSIS — Z888 Allergy status to other drugs, medicaments and biological substances status: Secondary | ICD-10-CM | POA: Insufficient documentation

## 2018-01-27 DIAGNOSIS — I70263 Atherosclerosis of native arteries of extremities with gangrene, bilateral legs: Secondary | ICD-10-CM | POA: Insufficient documentation

## 2018-01-27 DIAGNOSIS — Z8711 Personal history of peptic ulcer disease: Secondary | ICD-10-CM | POA: Diagnosis not present

## 2018-01-27 DIAGNOSIS — I132 Hypertensive heart and chronic kidney disease with heart failure and with stage 5 chronic kidney disease, or end stage renal disease: Secondary | ICD-10-CM | POA: Insufficient documentation

## 2018-01-27 DIAGNOSIS — I70269 Atherosclerosis of native arteries of extremities with gangrene, unspecified extremity: Secondary | ICD-10-CM | POA: Diagnosis present

## 2018-01-27 DIAGNOSIS — I82403 Acute embolism and thrombosis of unspecified deep veins of lower extremity, bilateral: Secondary | ICD-10-CM | POA: Diagnosis present

## 2018-01-27 DIAGNOSIS — M62261 Nontraumatic ischemic infarction of muscle, right lower leg: Secondary | ICD-10-CM | POA: Insufficient documentation

## 2018-01-27 DIAGNOSIS — I509 Heart failure, unspecified: Secondary | ICD-10-CM | POA: Insufficient documentation

## 2018-01-27 DIAGNOSIS — M62262 Nontraumatic ischemic infarction of muscle, left lower leg: Secondary | ICD-10-CM | POA: Diagnosis not present

## 2018-01-27 DIAGNOSIS — R531 Weakness: Secondary | ICD-10-CM | POA: Diagnosis not present

## 2018-01-27 DIAGNOSIS — I998 Other disorder of circulatory system: Secondary | ICD-10-CM | POA: Diagnosis present

## 2018-01-27 DIAGNOSIS — Z9049 Acquired absence of other specified parts of digestive tract: Secondary | ICD-10-CM | POA: Diagnosis not present

## 2018-01-27 DIAGNOSIS — Z9889 Other specified postprocedural states: Secondary | ICD-10-CM | POA: Insufficient documentation

## 2018-01-27 DIAGNOSIS — N2581 Secondary hyperparathyroidism of renal origin: Secondary | ICD-10-CM | POA: Insufficient documentation

## 2018-01-27 DIAGNOSIS — Z993 Dependence on wheelchair: Secondary | ICD-10-CM | POA: Diagnosis not present

## 2018-01-27 DIAGNOSIS — Z66 Do not resuscitate: Secondary | ICD-10-CM

## 2018-01-27 DIAGNOSIS — Z823 Family history of stroke: Secondary | ICD-10-CM | POA: Diagnosis not present

## 2018-01-27 DIAGNOSIS — E785 Hyperlipidemia, unspecified: Secondary | ICD-10-CM | POA: Diagnosis not present

## 2018-01-27 DIAGNOSIS — Z79891 Long term (current) use of opiate analgesic: Secondary | ICD-10-CM | POA: Diagnosis not present

## 2018-01-27 DIAGNOSIS — N186 End stage renal disease: Secondary | ICD-10-CM | POA: Insufficient documentation

## 2018-01-27 DIAGNOSIS — I70229 Atherosclerosis of native arteries of extremities with rest pain, unspecified extremity: Secondary | ICD-10-CM | POA: Diagnosis present

## 2018-01-27 DIAGNOSIS — R404 Transient alteration of awareness: Secondary | ICD-10-CM | POA: Diagnosis not present

## 2018-01-27 HISTORY — PX: ABDOMINAL AORTOGRAM W/LOWER EXTREMITY: CATH118223

## 2018-01-27 LAB — POCT I-STAT, CHEM 8
BUN: 32 mg/dL — AB (ref 6–20)
CALCIUM ION: 1.25 mmol/L (ref 1.15–1.40)
CHLORIDE: 101 mmol/L (ref 101–111)
CREATININE: 5.1 mg/dL — AB (ref 0.44–1.00)
GLUCOSE: 73 mg/dL (ref 65–99)
HCT: 40 % (ref 36.0–46.0)
Hemoglobin: 13.6 g/dL (ref 12.0–15.0)
Potassium: 4.3 mmol/L (ref 3.5–5.1)
Sodium: 138 mmol/L (ref 135–145)
TCO2: 29 mmol/L (ref 22–32)

## 2018-01-27 LAB — PROTIME-INR
INR: 1.18
Prothrombin Time: 14.9 seconds (ref 11.4–15.2)

## 2018-01-27 SURGERY — ABDOMINAL AORTOGRAM W/LOWER EXTREMITY
Anesthesia: LOCAL

## 2018-01-27 MED ORDER — DILTIAZEM HCL 100 MG IV SOLR
5.0000 mg/h | INTRAVENOUS | Status: DC
  Administered 2018-01-27: 5 mg/h via INTRAVENOUS
  Administered 2018-01-28: 04:00:00 10 mg/h via INTRAVENOUS
  Administered 2018-01-28 – 2018-01-29 (×3): 15 mg/h via INTRAVENOUS
  Filled 2018-01-27 (×5): qty 100

## 2018-01-27 MED ORDER — SODIUM CHLORIDE 0.9% FLUSH: 3.0000 mL | INTRAVENOUS | Status: DC | PRN

## 2018-01-27 MED ORDER — ACETAMINOPHEN 325 MG PO TABS: 650.0000 mg | ORAL_TABLET | ORAL | Status: DC | PRN

## 2018-01-27 MED ORDER — HYDRALAZINE HCL 20 MG/ML IJ SOLN: 5.0000 mg | INTRAMUSCULAR | Status: DC | PRN

## 2018-01-27 MED ORDER — SORBITOL 70 % SOLN
30.0000 mL | Status: DC | PRN
  Filled 2018-01-27: qty 30

## 2018-01-27 MED ORDER — RENA-VITE PO TABS
1.0000 | ORAL_TABLET | Freq: Every day | ORAL | Status: DC
  Administered 2018-01-27: 1 via ORAL
  Filled 2018-01-27: qty 1

## 2018-01-27 MED ORDER — SODIUM CHLORIDE 0.9 % IV SOLN: 250.0000 mL | INTRAVENOUS | Status: DC | PRN

## 2018-01-27 MED ORDER — NEPRO/CARBSTEADY PO LIQD
237.0000 mL | Freq: Three times a day (TID) | ORAL | Status: DC | PRN
  Filled 2018-01-27: qty 237

## 2018-01-27 MED ORDER — PROMETHAZINE HCL 25 MG PO TABS: 25.0000 mg | ORAL_TABLET | Freq: Four times a day (QID) | ORAL | Status: DC | PRN

## 2018-01-27 MED ORDER — HEPARIN (PORCINE) IN NACL 2-0.9 UNIT/ML-% IJ SOLN
INTRAMUSCULAR | Status: AC | PRN
  Administered 2018-01-27: 500 mL

## 2018-01-27 MED ORDER — MORPHINE SULFATE (PF) 2 MG/ML IV SOLN
1.0000 mg | Freq: Once | INTRAVENOUS | Status: AC
  Administered 2018-01-27: 1 mg via INTRAVENOUS
  Filled 2018-01-27: qty 0.5

## 2018-01-27 MED ORDER — LIDOCAINE HCL 1 % IJ SOLN
INTRAMUSCULAR | Status: AC
  Filled 2018-01-27: qty 20

## 2018-01-27 MED ORDER — DOCUSATE SODIUM 283 MG RE ENEM
1.0000 | ENEMA | RECTAL | Status: DC | PRN
  Filled 2018-01-27: qty 1

## 2018-01-27 MED ORDER — MORPHINE SULFATE (PF) 2 MG/ML IV SOLN: 2.0000 mg | INTRAVENOUS | Status: DC | PRN

## 2018-01-27 MED ORDER — HEPARIN (PORCINE) IN NACL 100-0.45 UNIT/ML-% IJ SOLN
900.0000 [IU]/h | INTRAMUSCULAR | Status: DC
  Administered 2018-01-27: 20:00:00 900 [IU]/h via INTRAVENOUS
  Filled 2018-01-27 (×2): qty 250

## 2018-01-27 MED ORDER — ONDANSETRON HCL 4 MG/2ML IJ SOLN: 4.0000 mg | Freq: Four times a day (QID) | INTRAMUSCULAR | Status: DC | PRN

## 2018-01-27 MED ORDER — ACETAMINOPHEN 650 MG RE SUPP: 650.0000 mg | Freq: Four times a day (QID) | RECTAL | Status: DC | PRN

## 2018-01-27 MED ORDER — ACETAMINOPHEN 325 MG PO TABS: 650.0000 mg | ORAL_TABLET | Freq: Four times a day (QID) | ORAL | Status: DC | PRN

## 2018-01-27 MED ORDER — ONDANSETRON HCL 4 MG/2ML IJ SOLN
4.0000 mg | Freq: Four times a day (QID) | INTRAMUSCULAR | Status: DC | PRN
  Filled 2018-01-27: qty 2

## 2018-01-27 MED ORDER — HEPARIN (PORCINE) IN NACL 2-0.9 UNIT/ML-% IJ SOLN
INTRAMUSCULAR | Status: AC
  Filled 2018-01-27: qty 1000

## 2018-01-27 MED ORDER — HYDROXYZINE HCL 25 MG PO TABS
25.0000 mg | ORAL_TABLET | Freq: Four times a day (QID) | ORAL | Status: DC | PRN
Start: 2018-01-27 — End: 2018-01-29
  Filled 2018-01-27: qty 1

## 2018-01-27 MED ORDER — ZOLPIDEM TARTRATE 5 MG PO TABS: 5.0000 mg | ORAL_TABLET | Freq: Every evening | ORAL | Status: DC | PRN

## 2018-01-27 MED ORDER — CAMPHOR-MENTHOL 0.5-0.5 % EX LOTN
1.0000 "application " | TOPICAL_LOTION | Freq: Three times a day (TID) | CUTANEOUS | Status: DC | PRN
  Filled 2018-01-27: qty 222

## 2018-01-27 MED ORDER — OXYCODONE HCL 5 MG PO TABS: 5.0000 mg | ORAL_TABLET | ORAL | Status: DC | PRN

## 2018-01-27 MED ORDER — MORPHINE SULFATE (PF) 4 MG/ML IV SOLN
INTRAVENOUS | Status: AC
  Filled 2018-01-27: qty 1

## 2018-01-27 MED ORDER — ONDANSETRON HCL 4 MG PO TABS: 4.0000 mg | ORAL_TABLET | Freq: Four times a day (QID) | ORAL | Status: DC | PRN

## 2018-01-27 MED ORDER — CALCIUM CARBONATE ANTACID 1250 MG/5ML PO SUSP
500.0000 mg | Freq: Four times a day (QID) | ORAL | Status: DC | PRN
  Filled 2018-01-27: qty 5

## 2018-01-27 MED ORDER — IODIXANOL 320 MG/ML IV SOLN
INTRAVENOUS | Status: DC | PRN
  Administered 2018-01-27: 97 mL via INTRA_ARTERIAL

## 2018-01-27 MED ORDER — SODIUM CHLORIDE 0.9% FLUSH
3.0000 mL | Freq: Two times a day (BID) | INTRAVENOUS | Status: DC
  Administered 2018-01-27: 20:00:00 6 mL via INTRAVENOUS
  Administered 2018-01-28: 23:00:00 3 mL via INTRAVENOUS

## 2018-01-27 MED ORDER — HYDROXYZINE HCL 25 MG PO TABS
25.0000 mg | ORAL_TABLET | Freq: Three times a day (TID) | ORAL | Status: DC | PRN
  Filled 2018-01-27: qty 1

## 2018-01-27 MED ORDER — LIDOCAINE HCL (PF) 1 % IJ SOLN
INTRAMUSCULAR | Status: DC | PRN
  Administered 2018-01-27: 15 mL

## 2018-01-27 MED ORDER — DILTIAZEM LOAD VIA INFUSION
15.0000 mg | Freq: Once | INTRAVENOUS | Status: AC
  Administered 2018-01-27: 20:00:00 5 mg via INTRAVENOUS
  Filled 2018-01-27: qty 15

## 2018-01-27 MED ORDER — HYDROCODONE-ACETAMINOPHEN 10-325 MG PO TABS: 1.0000 | ORAL_TABLET | Freq: Four times a day (QID) | ORAL | Status: DC | PRN

## 2018-01-27 MED ORDER — ALPRAZOLAM 0.5 MG PO TABS
0.5000 mg | ORAL_TABLET | Freq: Three times a day (TID) | ORAL | Status: DC | PRN
  Administered 2018-01-29: 0.5 mg via ORAL
  Filled 2018-01-27: qty 1

## 2018-01-27 SURGICAL SUPPLY — 10 items
CATH OMNI FLUSH 5F 65CM (CATHETERS) ×2 IMPLANT
COVER PRB 48X5XTLSCP FOLD TPE (BAG) ×1 IMPLANT
COVER PROBE 5X48 (BAG) ×1
KIT MICROPUNCTURE NIT STIFF (SHEATH) ×2 IMPLANT
KIT PV (KITS) ×2 IMPLANT
SHEATH AVANTI 11CM 5FR (SHEATH) ×2 IMPLANT
SYR MEDRAD MARK V 150ML (SYRINGE) ×2 IMPLANT
TRANSDUCER W/STOPCOCK (MISCELLANEOUS) ×2 IMPLANT
TRAY PV CATH (CUSTOM PROCEDURE TRAY) ×2 IMPLANT
WIRE BENTSON .035X145CM (WIRE) ×2 IMPLANT

## 2018-01-27 NOTE — Consult Note (Addendum)
Consult Note   Patricia Ayala PFX:[id number] DOB: 15-May-1929 DOA: 01/27/2018  PCP: [name], MD Consultants:  Randie Heinz - vascular Patient coming from:  Home - lives with husband; NOK: Husband  Reason for Consult: Dr. Randie Heinz requested medical management  HPI: Patricia Ayala is a 84 y.o. female with medical history significant of PVD; HTN; HLD; ESRD on TTS HD; CHF; afib presenting for angiogram with B heel ulcers.  Her husband is unwilling to discuss amputation.  Dr. Randie Heinz wants to admit with [facility] consultation.  Of note, she was previously on Coumadin but developed a life-threatening GI bleeding from gastric ulcers in 4/18 and Coumadin was discontinued.  She developed B DVTs (extensive) in 1/19 and Coumadin was resumed with plan for lifelong anticoagulation.    Angiogram was performed today by Dr. Randie Heinz.  The procedure showed heavy calcification diffusely with occlusion.  There are no reconstruction options for this patient; she has no options for intervention.  Dr. Randie Heinz reports that husband now expected this; he will consult palliative care to see the patient.  Her only options at this point appear to be B LE amputation for pain control or comfort care measures.   She has a real bad hurting in her legs, knees hurt so bad she can't get up.  She can stand up but she is unable to move her legs.  She can get up and wash up and go to dialysis and come home and fix dinner - but all of the sudden she can't do it anymore and she doesn't know what happened.   Review of Systems: As per HPI; otherwise review of systems reviewed and negative.   Ambulatory Status:  Ambulated without assistance, then she walked with a walker, and now she is using a wheelchair  Past Medical History:  Diagnosis Date  . Anemia 02/11/2012  . Anxiety   . Aortic stenosis    mild AS 10/2016 echo  . Arthritis    arthritis in neck  . Atrial fibrillation (HCC)   . CHF (congestive heart failure) (HCC)   . Chronic kidney disease      hemodialysis Tu/Th/Sa  . Dyspnea    on exertion  . GERD (gastroesophageal reflux disease)   . GI bleed 01/2017   due to gastric ulcer, taking NSAIDs while on coumadin  . H/O metabolic acidosis   . Headache   . Hyperlipidemia   . Hyperparathyroidism, secondary (HCC)   . Hypertension    Dr. Regino Schultze, [place], [place]  . Peptic ulcer   . Peripheral vascular disease (HCC)   . Vitamin D deficiency     Past Surgical History:  Procedure Laterality Date  . A/V FISTULAGRAM N/A 05/10/2017   Procedure: A/V Fistulagram;  Surgeon: Sherren Kerns, MD;  Location: [facility] INVASIVE CV LAB;  Service: Cardiovascular;  Laterality: N/A;  . A/V FISTULAGRAM Left 11/18/2017   Procedure: A/V FISTULAGRAM;  Surgeon: Fransisco Hertz, MD;  Location: [facility] INVASIVE CV LAB;  Service: Cardiovascular;  Laterality: Left;  . ABDOMINAL HYSTERECTOMY    . AV FISTULA PLACEMENT  04/01/2012   Procedure: ARTERIOVENOUS (AV) FISTULA CREATION;  Surgeon: Sherren Kerns, MD;  Location: [facility] OR;  Service: Vascular;  Laterality: Left;  . AV FISTULA PLACEMENT Left 06/24/2017   Procedure: ARTERIOVENOUS (AV) FISTULA CREATION;  Surgeon: Maeola Harman, MD;  Location: [facility] OR;  Service: Vascular;  Laterality: Left;  . BASCILIC VEIN TRANSPOSITION Left 08/26/2017   Procedure: BASILIC VEIN TRANSPOSITION SECOND STAGE;  Surgeon: Maeola Harman, MD;  Location: [facility] OR;  Service: Vascular;  Laterality: Left;  . BIOPSY  11/26/2017   Procedure: BIOPSY;  Surgeon: West Bali, MD;  Location: [facility] ENDO SUITE;  Service: Endoscopy;;  gastric   . CHOLECYSTECTOMY    . COLON SURGERY     for a blockage  . ESOPHAGOGASTRODUODENOSCOPY N/A 02/17/2017   Dr. Myrtie Neither: normal esophagus, red blood in gastric fundus and body, non-bleeding gastric ulcer with visible vessel s/p injection, clip, and cautery, normal duodenum, no specimens collected. H.pylori serology positive   . ESOPHAGOGASTRODUODENOSCOPY N/A 11/26/2017   Procedure:  ESOPHAGOGASTRODUODENOSCOPY (EGD);  Surgeon: West Bali, MD;  Location: [facility] ENDO [facility]ITE;  Service: Endoscopy;  Laterality: N/A;  . EYE SURGERY     bilateral cataracts, /w IOL - 2013  . FISTULOGRAM N/A 11/05/2014   Procedure: FISTULOGRAM;  Surgeon: Larina Earthly, MD;  Location: [facility] CATH LAB;  Service: Cardiovascular;  Laterality: N/A;  . INSERTION OF DIALYSIS CATHETER  04/10/2012   Procedure: INSERTION OF DIALYSIS CATHETER;  Surgeon: Pryor Ochoa, MD;  Location: [facility] OR;  Service: Vascular;  Laterality: N/A;  Insertion Diatek Catheter Right Internal Jugular  . LIGATION OF COMPETING BRANCHES OF ARTERIOVENOUS FISTULA  11/07/2012   Procedure: LIGATION OF COMPETING BRANCHES OF ARTERIOVENOUS FISTULA;  Surgeon: Chuck Hint, MD;  Location: [facility] OR;  Service: Vascular;  Laterality: Left;  Brachio/Cephalic Fistula  . SHUNTOGRAM N/A 11/03/2012   Procedure: Betsey Amen;  Surgeon: Chuck Hint, MD;  Location: [facility] CATH LAB;  Service: Cardiovascular;  Laterality: N/A;  . SHUNTOGRAM Left 03/09/2013   Procedure: Fistulogram;  Surgeon: Fransisco Hertz, MD;  Location: [facility] CATH LAB;  Service: Cardiovascular;  Laterality: Left;  . SHUNTOGRAM Left 11/23/2013   Procedure: FISTULOGRAM;  Surgeon: Fransisco Hertz, MD;  Location: [facility] CATH LAB;  Service: Cardiovascular;  Laterality: Left;    Social History   Socioeconomic History  . Marital status: Married    Spouse name: Not on file  . Number of children: Not on file  . Years of education: Not on file  . Highest education level: Not on file  Occupational History  . Not on file  Social Needs  . Financial resource strain: Not on file  . Food insecurity:    Worry: Not on file    Inability: Not on file  . Transportation needs:    Medical: Not on file    Non-medical: Not on file  Tobacco Use  . Smoking status: Never Smoker  . Smokeless tobacco: Never Used  Substance and Sexual Activity  . Alcohol use: No  . Drug use: No  . Sexual activity: Not on file   Lifestyle  . Physical activity:    Days per week: Not on file    Minutes per session: Not on file  . Stress: Not on file  Relationships  . Social connections:    Talks on phone: Not on file    Gets together: Not on file    Attends religious service: Not on file    Active member of club or organization: Not on file    Attends meetings of clubs or organizations: Not on file    Relationship status: Not on file  . Intimate partner violence:    Fear of current or ex partner: Not on file    Emotionally abused: Not on file    Physically abused: Not on file    Forced sexual activity: Not on file  Other Topics Concern  . Not on file  Social History  Narrative  . Not on file    Allergies  Allergen Reactions  . Coumadin [Warfarin Sodium] Other (See Comments)    GI BLEED > DOSE RELATED    Family History  Problem Relation Age of Onset  . Stroke Mother   . Cancer Father   . Heart attack Father   . Colon cancer Neg Hx     Prior to Admission medications   Medication Sig Start Date End Date Taking? Authorizing Provider  ALPRAZolam Prudy Feeler) 0.5 MG tablet Take 0.5 mg by mouth 3 (three) times daily as needed for anxiety.   Yes [provider]  diltiazem (CARDIZEM) 60 MG tablet Take 60 mg by mouth daily.   Yes [provider]  HYDROcodone-acetaminophen (NORCO) 10-325 MG tablet Take 1 tablet by mouth 4 (four) times daily as needed for moderate pain.    Yes [provider]  hydrOXYzine (ATARAX/VISTARIL) 25 MG tablet Take 1 tablet by mouth 4 (four) times daily as needed for itching. 11/21/17  Yes [provider]  metoprolol tartrate (LOPRESSOR) 25 MG tablet Take 2 tablets (50 mg total) by mouth 2 (two) times daily. 12/03/17  Yes Dhungel, Nishant, MD  multivitamin (RENA-VIT) TABS tablet Take 1 tablet by mouth daily.   Yes [provider]  promethazine (PHENERGAN) 25 MG tablet Take 25 mg by mouth every 6 (six) hours as needed for nausea or vomiting.   Yes  [provider]  sevelamer (RENAGEL) 800 MG tablet Take 1,600 mg by mouth 3 (three) times daily with meals.   Yes [provider]  warfarin (COUMADIN) 2.5 MG tablet Take 1 tablet (2.5 mg total) by mouth daily at 6 PM. 12/04/17  Yes Dhungel, Nishant, MD  HYDROmorphone (DILAUDID) 2 MG tablet Take 1 tablet (2 mg total) by mouth every 4 (four) hours as needed for severe pain. Patient not taking: Reported on 01/22/2018 01/19/18   Dione Booze, MD    Physical Exam: Vitals:   01/27/18 1445 01/27/18 1450 01/27/18 1455 01/27/18 1705  BP:   118/83 127/89  Pulse: (!) 138 79 84 93  Resp: 16 14 (!) 21 14  Temp:      TempSrc:      SpO2: 98% 100% 100% 99%  Weight:      Height:         General:  Appears calm and comfortable and is NAD Eyes:   EOMI, normal lids, iris ENT:  grossly normal hearing, lips & tongue, mmm Neck:  no LAD, masses or thyromegaly Cardiovascular:  Irregularly irregular but rate 120-140, no m/r/g. No LE edema.  Respiratory:   CTA bilaterally with no wheezes/rales/rhonchi.  Normal respiratory effort. Abdomen:  soft, NT, ND, NABS Musculoskeletal: gangrene of the left great toe, bilateral heel ulcers, R>L Psychiatric:  grossly normal mood and affect, speech fluent and appropriate, AOx3 Neurologic:  CN 2-12 grossly intact, moves all extremities in coordinated fashion    Radiological Exams on Admission: No results found.  EKG: None   Labs on Admission: I have personally reviewed the available labs and imaging studies at the time of the admission.  Pertinent labs:   BUN 32/Creatinine 5.1 (POC); 11/3.14/14 on 3/24 Hgb 11 INR 1.08  Assessment/Plan Principal Problem:   Critical lower limb ischemia Active Problems:   ESRD (end stage renal disease) (HCC)   Benign essential HTN   Atrial fibrillation with RVR (HCC)   Chronic anticoagulation   Extensive DVT of lower extremity, bilateral (HCC)   Atherosclerosis of native arteries of the extremities  with  gangrene [facility])   PAD (peripheral artery disease) (HCC)   Critical limb ischemia with atherosclerosis, gangrene due to PAD -Patient with gangrene and marked PVD -She was seen in clinic on 3/25 by Dr. Imogene Burn and scheduled for arteriogram today with Dr. Randie Heinz -Unfortunately, based on today's procedures she has extremely limited options - there are no reconstruction options -Will admit for palliative care consult and ongoing discussion -Her options appear to be comfort care measures vs. Bilateral amputations  Afib with RVR on Coumadin -Post-operatively, patient has afib with RVR -Atrial Fibrillation:   -Patient presenting with new-onset afib.  -She has known afib but was rate controlled at the time of presentation -Will admit to SDU for Diltiazem drip as per protocol with plan to transition to PO Diltiazem once heart rate is controlled.   -Patient is on Coumadin at home; transition to Heparin drip for now while considering options.  ESRD -Patient on chronic TTS HD -Nephrology prn order set utilized -She does not appear to be volume overloaded or otherwise in need of acute HD -Maintenance HD telephone line called and left message that patient will need HD -Given her diffuse severe vascular occlusions, it is very reasonable to consider discontinuation of dialysis at this time - but nephrology needs to be involved in this discussion  HTN -Hold BP meds while on Cardizem drip -Patient takes Cardizem and Lopressor at home - holding for now  Extensive DVTs -Given her extensive B DVTs, she needs ongoing lifetime anticoagulation unless the decision is made to proceed with comfort measures - which is extremely reasonable in this circumstance.   DVT prophylaxis: Heparin drip Code Status:  DNR - confirmed with patient Family Communication: None present at the time of my evaluation Disposition Plan:  To be determined Consults called: Vascular admitted the patient; Nephrology; Palliative Care   Admission status: Admit - It is my clinical opinion that admission to INPATIENT is reasonable and necessary because of the expectation that this patient will require hospital care that crosses at least 2 midnights to treat this condition based on the medical complexity of the problems presented.  Given the aforementioned information, the predictability of an adverse outcome is felt to be significant.   [facility] appreciates this consultation.  We will continue to follow the patient.  If the situation progresses to a point where the patient is not considering amputations, [facility] will be happy to accept the patient on our service.  Patient was discussed directly with Dr. Randie Heinz.   Jonah Blue MD [facility]  If note is complete, please contact covering daytime or nighttime physician. [email or web address] Password [email or web address]  01/27/2018, 5:20 PM

## 2018-01-27 NOTE — Op Note (Signed)
    Patient name: Patricia Ayala MRN: [id number] DOB: 23-Dec-1928 Sex: female  01/27/2018 Pre-operative Diagnosis: Critical bilateral lower extremity ischemia Post-operative diagnosis:  Same Surgeon:  Apolinar Junes C. Randie Heinz, MD Procedure Performed: 1.  Ultrasound-guided cannulation right common femoral artery 2.  Aortogram with bilateral lower extremity runoff  Indications: 84 year old female with end-stage renal disease now has bilateral heel ulceration in the left great toe that is necrotic.  She is indicated for angiogram bilateral runoff possible intervention on the left  Findings: The right common femoral artery was heavily calcified and cannulation.  Her entire aorta is calcified as is the SMA.  Her left renal appears to be patent but she is end-stage renal.  Her left external iliac artery is totally occluded and reconstitutes what appears to be common femoral artery which is heavily calcified.  Her bilateral superficial femoral arteries are occluded and she appears to reconstitute peroneal arteries only.  There are no reconstruction options   Procedure:  The patient was identified in the holding area and taken to room 8.  The patient was then placed supine on the table and prepped and draped in the usual sterile fashion.  A time out was called.  Ultrasound was used to evaluate the right common femoral artery.  It was patent .  A digital ultrasound image was acquired.  A micropuncture needle was used to access the right common femoral artery under ultrasound guidance.  An 018 wire was advanced without resistance and a micropuncture sheath was placed.  The 018 wire was removed and a benson wire was placed.  The micropuncture sheath was exchanged for a 5 french sheath.  An omniflush catheter was advanced over the wire to the level of L-1.  An abdominal angiogram was obtained followed by bilateral lower extremity runoff.  Catheter was removed over wire.  Sheath we pulled in postop holding.  This patient has  no options for intervention.  Contrast: 97cc   Brittaney Beaulieu C. Randie Heinz, MD [facility] Office: [phone] Pager: [phone]

## 2018-01-27 NOTE — Progress Notes (Signed)
RFA sheath was removed by Nita Sells.Bed rest is 1240 till 1640.

## 2018-01-27 NOTE — H&P (Signed)
   History and Physical Update  The patient was interviewed and re-examined.  The patient's previous History and Physical has been reviewed and is unchanged from office visit. Plan for aortogram with ble runoff and possible intervention on the left today. Unfortunately she has progression of her heel ulcers and will be at high risk of proximal amputations despite revascularization. I will discuss admission with the hospitalists following the procedure.   Oran Dillenburg C. Randie Heinz, MD [facility] [place] Office: [phone] Pager: [phone]   01/27/2018, 7:39 AM

## 2018-01-27 NOTE — Progress Notes (Signed)
ANTICOAGULATION CONSULT NOTE - Initial Consult  Pharmacy Consult for heparin Indication: atrial fibrillation  Allergies  Allergen Reactions  . Coumadin [Warfarin Sodium] Other (See Comments)    GI BLEED > DOSE RELATED    Patient Measurements: Height: 5\' 4"  (162.6 cm) Weight: 145 lb (65.8 kg) IBW/kg (Calculated) : 54.7   Vital Signs: Temp: 98.5 F (36.9 C) (04/01 0644) Temp Source: Oral (04/01 0644) BP: 115/97 (04/01 1345) Pulse Rate: 40 (04/01 1345)  Labs: Recent Labs    01/27/18 0816 01/27/18 0855  HGB 13.6  --   HCT 40.0  --   LABPROT  --  14.9  INR  --  1.18  CREATININE 5.10*  --     Estimated Creatinine Clearance: 7.3 mL/min (A) (by C-G formula based on SCr of 5.1 mg/dL (H)).   Medical History: Past Medical History:  Diagnosis Date  . Anemia 02/11/2012  . Anxiety   . Aortic stenosis    mild AS 10/2016 echo  . Arthritis    arthritis in neck  . Atrial fibrillation (HCC)   . CHF (congestive heart failure) (HCC)   . Chronic kidney disease    hemodialysis Tu/Th/Sa  . Dyspnea    on exertion  . GERD (gastroesophageal reflux disease)   . GI bleed 01/2017   due to gastric ulcer, taking NSAIDs while on coumadin  . H/O metabolic acidosis   . Headache   . Hyperlipidemia   . Hyperparathyroidism, secondary (HCC)   . Hypertension    Dr. Regino Schultze, [place], [place]  . Peptic ulcer   . Peripheral vascular disease (HCC)   . Vitamin D deficiency     Medications:  Medications Prior to Admission  Medication Sig Dispense Refill Last Dose  . ALPRAZolam (XANAX) 0.5 MG tablet Take 0.5 mg by mouth 3 (three) times daily as needed for anxiety.   01/26/2018 at Unknown time  . diltiazem (CARDIZEM) 60 MG tablet Take 60 mg by mouth daily.   01/27/2018 at 0600  . enoxaparin (LOVENOX) 80 MG/0.8ML injection Inject 80 mg into the skin daily.     Marland Kitchen HYDROcodone-acetaminophen (NORCO) 10-325 MG tablet Take 1 tablet by mouth 4 (four) times daily as needed for moderate pain.    01/27/2018 at  0530  . hydrOXYzine (ATARAX/VISTARIL) 25 MG tablet Take 1 tablet by mouth 4 (four) times daily as needed for itching.   Past Week at Unknown time  . metoprolol tartrate (LOPRESSOR) 25 MG tablet Take 2 tablets (50 mg total) by mouth 2 (two) times daily. 30 tablet 0 01/27/2018 at 0600  . multivitamin (RENA-VIT) TABS tablet Take 1 tablet by mouth daily.   01/26/2018 at Unknown time  . promethazine (PHENERGAN) 25 MG tablet Take 25 mg by mouth every 6 (six) hours as needed for nausea or vomiting.   Past Week at Unknown time  . sevelamer (RENAGEL) 800 MG tablet Take 1,600 mg by mouth 3 (three) times daily with meals.   01/26/2018 at Unknown time  . warfarin (COUMADIN) 2.5 MG tablet Take 1 tablet (2.5 mg total) by mouth daily at 6 PM. 30 tablet 0 01/24/2018  . HYDROmorphone (DILAUDID) 2 MG tablet Take 1 tablet (2 mg total) by mouth every 4 (four) hours as needed for severe pain. (Patient not taking: Reported on 01/22/2018) 15 tablet 0 Completed Course at Unknown time   Scheduled:  . morphine        Assessment: 84 yo female w/ BLEcritical limb ischemia s/p aortogram w/ no interventional options . Pharmacy consulted  to start heparin 8 hrs post sheath (removed ~ 11:30pm). Pt on enox/warfarin PTA for afib.  Goal of Therapy:  Heparin level 0.3-0.7 units/ml Monitor platelets by anticoagulation protocol: Yes   Plan:  -Start heparin at 900 units/hr at 8pm -Heparin level in 8 hours and daily wth CBC daily  Harland German, PharmD Clinical Pharmacist. 01/27/2018 2:48 PM

## 2018-01-28 ENCOUNTER — Other Ambulatory Visit: Payer: Self-pay

## 2018-01-28 ENCOUNTER — Encounter (HOSPITAL_COMMUNITY): Payer: Self-pay | Admitting: Vascular Surgery

## 2018-01-28 DIAGNOSIS — I998 Other disorder of circulatory system: Secondary | ICD-10-CM

## 2018-01-28 DIAGNOSIS — Z9889 Other specified postprocedural states: Secondary | ICD-10-CM | POA: Diagnosis not present

## 2018-01-28 DIAGNOSIS — Z992 Dependence on renal dialysis: Secondary | ICD-10-CM | POA: Diagnosis not present

## 2018-01-28 DIAGNOSIS — I4891 Unspecified atrial fibrillation: Secondary | ICD-10-CM | POA: Diagnosis not present

## 2018-01-28 DIAGNOSIS — Z7901 Long term (current) use of anticoagulants: Secondary | ICD-10-CM | POA: Diagnosis not present

## 2018-01-28 DIAGNOSIS — Z79891 Long term (current) use of opiate analgesic: Secondary | ICD-10-CM | POA: Diagnosis not present

## 2018-01-28 DIAGNOSIS — M62261 Nontraumatic ischemic infarction of muscle, right lower leg: Secondary | ICD-10-CM | POA: Diagnosis not present

## 2018-01-28 DIAGNOSIS — N186 End stage renal disease: Secondary | ICD-10-CM | POA: Diagnosis not present

## 2018-01-28 DIAGNOSIS — Z8711 Personal history of peptic ulcer disease: Secondary | ICD-10-CM | POA: Diagnosis not present

## 2018-01-28 DIAGNOSIS — I132 Hypertensive heart and chronic kidney disease with heart failure and with stage 5 chronic kidney disease, or end stage renal disease: Secondary | ICD-10-CM | POA: Diagnosis not present

## 2018-01-28 DIAGNOSIS — I70263 Atherosclerosis of native arteries of extremities with gangrene, bilateral legs: Secondary | ICD-10-CM | POA: Diagnosis not present

## 2018-01-28 DIAGNOSIS — Z66 Do not resuscitate: Secondary | ICD-10-CM

## 2018-01-28 DIAGNOSIS — I509 Heart failure, unspecified: Secondary | ICD-10-CM | POA: Diagnosis not present

## 2018-01-28 DIAGNOSIS — E785 Hyperlipidemia, unspecified: Secondary | ICD-10-CM | POA: Diagnosis not present

## 2018-01-28 DIAGNOSIS — Z888 Allergy status to other drugs, medicaments and biological substances status: Secondary | ICD-10-CM | POA: Diagnosis not present

## 2018-01-28 DIAGNOSIS — Z9049 Acquired absence of other specified parts of digestive tract: Secondary | ICD-10-CM | POA: Diagnosis not present

## 2018-01-28 DIAGNOSIS — I739 Peripheral vascular disease, unspecified: Secondary | ICD-10-CM | POA: Diagnosis not present

## 2018-01-28 DIAGNOSIS — Z993 Dependence on wheelchair: Secondary | ICD-10-CM | POA: Diagnosis not present

## 2018-01-28 DIAGNOSIS — Z515 Encounter for palliative care: Secondary | ICD-10-CM | POA: Diagnosis not present

## 2018-01-28 DIAGNOSIS — N2581 Secondary hyperparathyroidism of renal origin: Secondary | ICD-10-CM | POA: Diagnosis not present

## 2018-01-28 DIAGNOSIS — Z823 Family history of stroke: Secondary | ICD-10-CM | POA: Diagnosis not present

## 2018-01-28 DIAGNOSIS — Z8249 Family history of ischemic heart disease and other diseases of the circulatory system: Secondary | ICD-10-CM | POA: Diagnosis not present

## 2018-01-28 DIAGNOSIS — M62262 Nontraumatic ischemic infarction of muscle, left lower leg: Secondary | ICD-10-CM | POA: Diagnosis not present

## 2018-01-28 DIAGNOSIS — I824Z3 Acute embolism and thrombosis of unspecified deep veins of distal lower extremity, bilateral: Secondary | ICD-10-CM | POA: Diagnosis not present

## 2018-01-28 LAB — CBC
HCT: 30.6 % — ABNORMAL LOW (ref 36.0–46.0)
HEMOGLOBIN: 9.8 g/dL — AB (ref 12.0–15.0)
MCH: 28.4 pg (ref 26.0–34.0)
MCHC: 32 g/dL (ref 30.0–36.0)
MCV: 88.7 fL (ref 78.0–100.0)
Platelets: 278 10*3/uL (ref 150–400)
RBC: 3.45 MIL/uL — ABNORMAL LOW (ref 3.87–5.11)
RDW: 15.6 % — ABNORMAL HIGH (ref 11.5–15.5)
WBC: 15.9 10*3/uL — AB (ref 4.0–10.5)

## 2018-01-28 LAB — HEPARIN LEVEL (UNFRACTIONATED)
Heparin Unfractionated: 1.68 IU/mL — ABNORMAL HIGH (ref 0.30–0.70)
Heparin Unfractionated: 1.72 IU/mL — ABNORMAL HIGH (ref 0.30–0.70)

## 2018-01-28 MED ORDER — HALOPERIDOL 0.5 MG PO TABS
0.5000 mg | ORAL_TABLET | ORAL | Status: DC | PRN
  Filled 2018-01-28: qty 1

## 2018-01-28 MED ORDER — HYDROMORPHONE HCL 2 MG PO TABS: 2.0000 mg | ORAL_TABLET | ORAL | Status: DC | PRN

## 2018-01-28 MED ORDER — POLYVINYL ALCOHOL 1.4 % OP SOLN
1.0000 [drp] | Freq: Four times a day (QID) | OPHTHALMIC | Status: DC | PRN
  Filled 2018-01-28: qty 15

## 2018-01-28 MED ORDER — ONDANSETRON HCL 4 MG/2ML IJ SOLN: 4.0000 mg | Freq: Four times a day (QID) | INTRAMUSCULAR | Status: DC | PRN

## 2018-01-28 MED ORDER — HALOPERIDOL 0.5 MG PO TABS: 0.5000 mg | ORAL_TABLET | ORAL | 0 refills | Status: DC | PRN

## 2018-01-28 MED ORDER — HALOPERIDOL LACTATE 2 MG/ML PO CONC
0.5000 mg | ORAL | Status: DC | PRN
  Filled 2018-01-28: qty 0.3

## 2018-01-28 MED ORDER — BIOTENE DRY MOUTH MT LIQD: 15.0000 mL | OROMUCOSAL | Status: DC | PRN

## 2018-01-28 MED ORDER — HYDROMORPHONE HCL 1 MG/ML IJ SOLN
0.5000 mg | INTRAMUSCULAR | Status: DC | PRN
  Administered 2018-01-28: 0.5 mg via INTRAVENOUS
  Filled 2018-01-28: qty 0.5

## 2018-01-28 MED ORDER — GLYCOPYRROLATE 0.2 MG/ML IJ SOLN
0.2000 mg | INTRAMUSCULAR | Status: DC | PRN
  Filled 2018-01-28: qty 1

## 2018-01-28 MED ORDER — GLYCOPYRROLATE 1 MG PO TABS
1.0000 mg | ORAL_TABLET | ORAL | Status: DC | PRN
  Filled 2018-01-28: qty 1

## 2018-01-28 MED ORDER — DOCUSATE SODIUM 283 MG RE ENEM: 1.0000 | ENEMA | RECTAL | 0 refills | Status: AC | PRN

## 2018-01-28 MED ORDER — HYDROMORPHONE HCL 2 MG PO TABS: 2.0000 mg | ORAL_TABLET | ORAL | 0 refills | Status: DC | PRN

## 2018-01-28 MED ORDER — SENNA 8.6 MG PO TABS
1.0000 | ORAL_TABLET | Freq: Every day | ORAL | Status: DC
  Administered 2018-01-28: 23:00:00 8.6 mg via ORAL
  Filled 2018-01-28 (×2): qty 1

## 2018-01-28 MED ORDER — ONDANSETRON 4 MG PO TBDP
4.0000 mg | ORAL_TABLET | Freq: Four times a day (QID) | ORAL | Status: DC | PRN
  Filled 2018-01-28: qty 1

## 2018-01-28 MED ORDER — GLYCOPYRROLATE 1 MG PO TABS: 1.0000 mg | ORAL_TABLET | ORAL | Status: AC | PRN

## 2018-01-28 MED ORDER — GLYCOPYRROLATE 0.2 MG/ML IJ SOLN
0.2000 mg | INTRAMUSCULAR | Status: DC | PRN
Start: 2018-01-28 — End: 2018-01-29
  Filled 2018-01-28: qty 1

## 2018-01-28 MED ORDER — HALOPERIDOL LACTATE 5 MG/ML IJ SOLN
0.5000 mg | INTRAMUSCULAR | Status: DC | PRN
  Filled 2018-01-28: qty 0.1

## 2018-01-28 MED FILL — Lidocaine HCl Local Inj 1%: INTRAMUSCULAR | Qty: 20 | Status: AC

## 2018-01-28 NOTE — Care Management Note (Signed)
Case Management Note  Patient Details  Name: MYALYNN HASBROOK MRN: [id number] Date of Birth: 01-Jan-1929  Subjective/Objective:            Spoke w patient's spouse at bedside who deferred conversation to daughter Belvie Delles. Spoke with Ellan Lambert [phone]. Ellan Lambert states that patient will DC to home with her at address [address] [place] [address]. She states that patient has WC at home and she deferred any additional DME at this time. She states she will decide in the AM if patient will need PTAR for transport home after she discusses it with her father tonight. She would like to [facility], all information passed on to intake RN at time of referral. Faxed notes and demographics to [place] [facility] [place] at [phone].         Action/Plan:  Will DC to home with daughter and followed by [facility] in AM. Family deciding if they want PTAR.   Expected Discharge Date:  01/28/18               Expected Discharge Plan:  Home w Hospice Care  In-House Referral:     Discharge planning Services  CM Consult  Post Acute Care Choice:    Choice offered to:  Adult Children, Spouse  DME Arranged:    DME Agency:     HH Arranged:    HH Agency:  [facility]  Status of Service:  Completed, signed off  If discussed at [employer] of Stay Meetings, dates discussed:    Additional Comments:  Lawerance Sabal, RN 01/28/2018, 4:44 PM

## 2018-01-28 NOTE — Progress Notes (Signed)
ANTICOAGULATION CONSULT NOTE  Pharmacy Consult for heparin Indication: atrial fibrillation  Allergies  Allergen Reactions  . Coumadin [Warfarin Sodium] Other (See Comments)    GI BLEED > DOSE RELATED    Patient Measurements: Height: 5\' 4"  (162.6 cm) Weight: 142 lb 13.7 oz (64.8 kg) IBW/kg (Calculated) : 54.7   Vital Signs: Temp: 99 F (37.2 C) (04/02 0324) Temp Source: Oral (04/02 0324) BP: 113/74 (04/02 0324) Pulse Rate: 132 (04/02 0324)  Labs: Recent Labs    01/27/18 0816 01/27/18 0855 01/28/18 0315 01/28/18 0538  HGB 13.6  --  9.8*  --   HCT 40.0  --  30.6*  --   PLT  --   --  278  --   LABPROT  --  14.9  --   --   INR  --  1.18  --   --   HEPARINUNFRC  --   --  1.68* 1.72*  CREATININE 5.10*  --   --   --     Estimated Creatinine Clearance: 6.7 mL/min (A) (by C-G formula based on SCr of 5.1 mg/dL (H)).  Assessment: 84 yo female with h/o Afib, Coumadin on hold, for heparin.  Heparin level very elevated this morning, likely due to Lovenox bridge patient was receiving pre-op combined with her ESRD.    Goal of Therapy:  Heparin level 0.3-0.7 units/ml Monitor platelets by anticoagulation protocol: Yes   Plan:  Hold heparin for now.  Recheck level at 10 am to assess magnitude of anticoagulation being provided by residual Lovenox.   Given h/o GIB, would restart heparin once level into goal range.  Geannie Risen, PharmD, BCPS  01/28/2018 6:54 AM

## 2018-01-28 NOTE — Progress Notes (Addendum)
  Progress Note    01/28/2018 8:11 AM 1 Day Post-Op  Subjective:  C/o pain in both feet and do not want them covered with the blanket    Vitals:   01/27/18 2137 01/28/18 0324  BP: 115/65 113/74  Pulse: (!) 122 (!) 132  Resp: 17 15  Temp: 98.8 F (37.1 C) 99 F (37.2 C)  SpO2: 94% 97%    Physical Exam: General:  No distress Lungs:  Non labored Incisions:  Right groin is soft without hematoma Abdomen:  Soft non tender to palpation  CBC    Component Value Date/Time   WBC 15.9 (H) 01/28/2018 0315   RBC 3.45 (L) 01/28/2018 0315   HGB 9.8 (L) 01/28/2018 0315   HCT 30.6 (L) 01/28/2018 0315   PLT 278 01/28/2018 0315   MCV 88.7 01/28/2018 0315   MCH 28.4 01/28/2018 0315   MCHC 32.0 01/28/2018 0315   RDW 15.6 (H) 01/28/2018 0315   LYMPHSABS 1.1 01/19/2018 0230   MONOABS 1.0 01/19/2018 0230   EOSABS 0.0 01/19/2018 0230   BASOSABS 0.0 01/19/2018 0230    BMET    Component Value Date/Time   NA 138 01/27/2018 0816   K 4.3 01/27/2018 0816   CL 101 01/27/2018 0816   CO2 28 01/19/2018 0230   GLUCOSE 73 01/27/2018 0816   BUN 32 (H) 01/27/2018 0816   CREATININE 5.10 (H) 01/27/2018 0816   CALCIUM 9.5 01/19/2018 0230   GFRNONAA 12 (L) 01/19/2018 0230   GFRAA 14 (L) 01/19/2018 0230    INR    Component Value Date/Time   INR 1.18 01/27/2018 0855     Intake/Output Summary (Last 24 hours) at 01/28/2018 0811 Last data filed at 01/28/2018 0200 Gross per 24 hour  Intake 245.2 ml  Output 0 ml  Net 245.2 ml     Assessment:  84 y.o. female is s/p:  1.  Ultrasound-guided cannulation right common femoral artery 2.  Aortogram with bilateral lower extremity runoff  1 Day Post-Op  Plan: -right groin is soft without hematoma and abdomen is soft as well. -Per Dr. Randie Heinz, there are no reconstruction options for this pt.  -Dr. Randie Heinz will see pt later today   Doreatha Massed, PA-C [facility] [phone] 01/28/2018 8:11 AM  I have independently interviewed  and examined patient and agree with PA assessment and plan above.  Per family they have decided to stop dialysis and will not 1 bilateral lower extremity amputations.  Palliative care to see today.  Appreciate hospitalist care of the patient.  I have notified nephrology of the family decision and they will arrange.  Brandon C. Randie Heinz, MD [facility] [place] Office: [phone] Pager: [phone]

## 2018-01-28 NOTE — Discharge Summary (Signed)
Physician Discharge Summary  Patricia Ayala QMV:[id number] DOB: 1929-05-05 DOA: 01/27/2018  PCP: [name], MD  Admit date: 01/27/2018 Discharge date: 01/28/2018  Time spent: > 35 minutes  Recommendations for Outpatient Follow-up:  1. Ensure comfort measures 2. Coumadin not continued on d/c   Discharge Diagnoses:  Principal Problem:   Critical lower limb ischemia Active Problems:   ESRD (end stage renal disease) (HCC)   Benign essential HTN   Atrial fibrillation with RVR (HCC)   Chronic anticoagulation   Extensive DVT of lower extremity, bilateral (HCC)   Atherosclerosis of native arteries of the extremities with gangrene (HCC)   PAD (peripheral artery disease) (HCC)   DNR (do not resuscitate)   Comfort measures only status   Discharge Condition: stable  Diet recommendation: as tolerated, comfort feeds  Filed Weights   01/27/18 0644 01/28/18 0324  Weight: 65.8 kg (145 lb) 64.8 kg (142 lb 13.7 oz)    History of present illness:  84 y.o. female  with past medical history of ESRD on HD, PUD, Afib, aortic stenosis, CHF, and anxiety who was admitted on 01/27/2018 with bilateral foot pain.  She was admitted last month with large bilateral DVTs.  She was found to have obstructive PAD with gangrene.  Vascular performed and aortogram and determined that there are no reconstruction or surgical options for her  Hospital Course:  BL lower extremity pain/pad with gangrene - patient refused BL amputation - palliative consulted and plan was for comfort care moving forward:  D/C home with Hospice care.   Patient will need Hospice wound care as well.  Orders changed to reflect comfort care. Patient will need prescription for dilaudid on discharge.  Would continue some type of anticoagulation (Coumadin) in order to reduce potentially painful DVTs  Please continue some type of rate control medication (metoprolol/cardizem)  Continue xanax.  Please discharge as soon as possible  (4/3?) as she will decline quickly.   Discussed continuing warfarin on d/c with specialist who did not feel that this was necessary and may cause harm in that this is a medication which would require monitoring of which the patient may not adequately have on discharge.   Procedures:  none  Consultations:  Vascular surgery  Palliative medicine  Discharge Exam: Vitals:   01/28/18 0700 01/28/18 1100  BP: 115/65 131/62  Pulse: (!) 127 (!) 146  Resp: (!) 26 15  Temp: 98.5 F (36.9 C) 98.7 F (37.1 C)  SpO2: 97% 96%    General: Pt in nad, alert and awake Cardiovascular: rrr, no rubs Respiratory: no increased wob, no wheezes  Discharge Instructions   Discharge Instructions    Call MD for:  severe uncontrolled pain   Complete by:  As directed    Call MD for:  temperature >100.4   Complete by:  As directed    Diet - low sodium heart healthy   Complete by:  As directed    Increase activity slowly   Complete by:  As directed      Allergies as of 01/28/2018      Reactions   Coumadin [warfarin Sodium] Other (See Comments)   GI BLEED > DOSE RELATED      Medication List    STOP taking these medications   diltiazem 60 MG tablet Commonly known as:  CARDIZEM   enoxaparin 80 MG/0.8ML injection Commonly known as:  LOVENOX   HYDROcodone-acetaminophen 10-325 MG tablet Commonly known as:  NORCO   hydrOXYzine 25 MG tablet Commonly known as:  ATARAX/VISTARIL  multivitamin Tabs tablet   promethazine 25 MG tablet Commonly known as:  PHENERGAN   sevelamer 800 MG tablet Commonly known as:  RENAGEL   warfarin 2.5 MG tablet Commonly known as:  COUMADIN     TAKE these medications   ALPRAZolam 0.5 MG tablet Commonly known as:  XANAX Take 0.5 mg by mouth 3 (three) times daily as needed for anxiety.   docusate sodium 283 MG enema Commonly known as:  ENEMEEZ Place 1 enema (283 mg total) rectally as needed for severe constipation.   glycopyrrolate 1 MG  tablet Commonly known as:  ROBINUL Take 1 tablet (1 mg total) by mouth every 4 (four) hours as needed (excessive secretions).   haloperidol 0.5 MG tablet Commonly known as:  HALDOL Take 1 tablet (0.5 mg total) by mouth every 4 (four) hours as needed for agitation (or delirium).   HYDROmorphone 2 MG tablet Commonly known as:  DILAUDID Take 1 tablet (2 mg total) by mouth every 2 (two) hours as needed (break thru pain). What changed:    when to take this  reasons to take this   metoprolol tartrate 25 MG tablet Commonly known as:  LOPRESSOR Take 2 tablets (50 mg total) by mouth 2 (two) times daily.      Allergies  Allergen Reactions  . Coumadin [Warfarin Sodium] Other (See Comments)    GI BLEED > DOSE RELATED      The results of significant diagnostics from this hospitalization (including imaging, microbiology, ancillary and laboratory) are listed below for reference.    Significant Diagnostic Studies: No results found.  Microbiology: No results found for this or any previous visit (from the past 240 hour(s)).   Labs: Basic Metabolic Panel: Recent Labs  Lab 01/27/18 0816  NA 138  K 4.3  CL 101  GLUCOSE 73  BUN 32*  CREATININE 5.10*   Liver Function Tests: No results for input(s): AST, ALT, ALKPHOS, BILITOT, PROT, ALBUMIN in the last 168 hours. No results for input(s): LIPASE, AMYLASE in the last 168 hours. No results for input(s): AMMONIA in the last 168 hours. CBC: Recent Labs  Lab 01/27/18 0816 01/28/18 0315  WBC  --  15.9*  HGB 13.6 9.8*  HCT 40.0 30.6*  MCV  --  88.7  PLT  --  278   Cardiac Enzymes: No results for input(s): CKTOTAL, CKMB, CKMBINDEX, TROPONINI in the last 168 hours. BNP: BNP (last 3 results) No results for input(s): BNP in the last 8760 hours.  ProBNP (last 3 results) No results for input(s): PROBNP in the last 8760 hours.  CBG: No results for input(s): GLUCAP in the last 168 hours.     Signed:  Penny Pia MD.   [facility] 01/28/2018, 2:05 PM

## 2018-01-28 NOTE — Care Management Obs Status (Signed)
MEDICARE OBSERVATION STATUS NOTIFICATION   Patient Details  Name: Patricia Ayala MRN: [id number] Date of Birth: Jul 08, 1929   Medicare Observation Status Notification Given:  Yes    Lawerance Sabal, RN 01/28/2018, 4:07 PM

## 2018-01-28 NOTE — Progress Notes (Signed)
After talking with nurse patient do not request chaplain and per nurse she does not care for visit at this time.  Chaplain available as needed.  Venida Jarvis, [place], [facility], Pager [phone]

## 2018-01-28 NOTE — Care Management CC44 (Signed)
Condition Code 44 Documentation Completed  Patient Details  Name: Patricia Ayala MRN: [id number] Date of Birth: [birth year]   Condition Code 44 given:  Yes Patient signature on Condition Code 44 notice:  Yes Documentation of 2 MD's agreement:  Yes Code 44 added to claim:  Yes    Lawerance Sabal, RN 01/28/2018, 4:07 PM

## 2018-01-28 NOTE — Consult Note (Signed)
Consultation Note Date: 01/28/2018   Patient Name: Patricia Ayala  DOB: 13-Jul-1929  MRN: [id number]  Age / Sex: 84 y.o., female  PCP: Redmond School, MD Referring Physician: Thomes Lolling*  Reason for Consultation: Inpatient hospice referral, Non pain symptom management, Pain control, Psychosocial/spiritual support and Withdrawal of life-sustaining treatment  HPI/Patient Profile: 84 y.o. female  with past medical history of ESRD on HD, PUD, Afib, aortic stenosis, CHF, and anxiety who was admitted on 01/27/2018 with bilateral foot pain.  She was admitted last month with large bilateral DVTs.  She was found to have obstructive PAD with gangrene.  Vascular performed and aortogram and determined that there are no reconstruction or surgical options for her.   Clinical Assessment and Goals of Care:  I have reviewed medical records including EPIC notes, labs and imaging, received report from the care team, assessed the patient and then met at the bedside along with her husband and daughter Mardene Celeste to discuss diagnosis prognosis, [place], EOL wishes, disposition and options.  I introduced Palliative Medicine as specialized medical care for people living with serious illness. It focuses on providing relief from the symptoms and stress of a serious illness. The goal is to improve quality of life for both the patient and the family.  We discussed a brief life review of the patient. 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  She worked hard her entire life.  She and her husband lived at home.  She was up walking and cooking until very recently.  As far as functional and nutritional status the day before admission she was unable to walk.  Her appetite has been poor recently and she has begun to sleep most of the day.  We discussed her current illness and what it means  in the larger context of their on-going co-morbidities.  Natural disease trajectory and expectations at EOL were discussed.  The family had already decided against surgery and further dialysis.  We talked about going home with hospice services.  It is most important to Mrs. Yetter to be at home.  Her daughter and grand daughter will be the primary care takers.  The family asked questions about prognosis, symptoms at end of life, medications and hospice services.  All questions were answered in detail.  Hospice and Palliative Care services outpatient were explained and offered.  The family would like to have [facility] in their home.   Primary Decision Maker:  NEXT OF KIN husband and daughter    SUMMARY OF RECOMMENDATIONS     D/C home with Hospice care.   Patient will need Hospice wound care as well.  Orders changed to reflect comfort care. Patient will need prescription for dilaudid on discharge.  Would continue some type of anticoagulation (Coumadin) in order to reduce potentially painful DVTs  Please continue some type of rate control medication (metoprolol/cardizem)  Continue xanax.  Please discharge as soon as possible (4/3?) as she will decline quickly.  Code Status/Advance Care Planning:  DNR   Symptom Management:   Change to dilaudid for pain in Renal failure (morphine does not do well with ESRD)  Prune juice and Senna for constipation particularly while on opioids.   Palliative Prophylaxis:   Frequent Pain Assessment  Psycho-social/Spiritual:   Desire for further Chaplaincy support: yes  Prognosis:  Two weeks or less.  Stopping HD.  Has critical limb ischemia.    Discharge Planning: Home with Hospice - Daughter's home.      Primary Diagnoses: Present on Admission: . PAD (peripheral artery disease) ([place]) . Critical lower limb ischemia . Atherosclerosis of native arteries of the extremities with gangrene ([place]) . Atrial fibrillation  with RVR ([place]) . Benign essential HTN . ESRD (end stage renal disease) ([place]) . Extensive DVT of lower extremity, bilateral ([place])   I have reviewed the medical record, interviewed the patient and family, and examined the patient. The following aspects are pertinent.  Past Medical History:  Diagnosis Date  . Anemia 02/11/2012  . Anxiety   . Aortic stenosis    mild AS 10/2016 echo  . Arthritis    arthritis in neck  . Atrial fibrillation ([place])   . CHF (congestive heart failure) ([place])   . Chronic kidney disease    hemodialysis Tu/Th/Sa  . Dyspnea    on exertion  . GERD (gastroesophageal reflux disease)   . GI bleed 01/2017   due to gastric ulcer, taking NSAIDs while on coumadin  . H/O metabolic acidosis   . Headache   . Hyperlipidemia   . Hyperparathyroidism, secondary ([place])   . Hypertension    Dr. Orson Ape, [place], [place]  . Peptic ulcer   . Peripheral vascular disease ([place])   . Vitamin D deficiency    Social History   Socioeconomic History  . Marital status: Married    Spouse name: Not on file  . Number of children: Not on file  . Years of education: Not on file  . Highest education level: Not on file  Occupational History  . Not on file  Social Needs  . Financial resource strain: Not on file  . Food insecurity:    Worry: Not on file    Inability: Not on file  . Transportation needs:    Medical: Not on file    Non-medical: Not on file  Tobacco Use  . Smoking status: Never Smoker  . Smokeless tobacco: Never Used  Substance and Sexual Activity  . Alcohol use: No  . Drug use: No  . Sexual activity: Not on file  Lifestyle  . Physical activity:    Days per week: Not on file    Minutes per session: Not on file  . Stress: Not on file  Relationships  . Social connections:    Talks on phone: Not on file    Gets together: Not on file    Attends religious service: Not on file    Active member of club or organization: Not on file    Attends meetings of clubs or  organizations: Not on file    Relationship status: Not on file  Other Topics Concern  . Not on file  Social History Narrative  . Not on file   Family History  Problem Relation Age of Onset  . Stroke Mother   . Cancer Father   . Heart attack Father   . Colon cancer Neg Hx    Scheduled Meds: . sodium chloride flush  3 mL Intravenous Q12H   Continuous  Infusions: . sodium chloride    . diltiazem (CARDIZEM) infusion 10 mg/hr (01/28/18 0423)  . heparin Stopped (01/28/18 0656)   PRN Meds:.sodium chloride, acetaminophen, ALPRAZolam, antiseptic oral rinse, calcium carbonate (dosed in mg elemental calcium), docusate sodium, feeding supplement (NEPRO CARB STEADY), glycopyrrolate **OR** glycopyrrolate **OR** glycopyrrolate, haloperidol **OR** haloperidol **OR** haloperidol lactate, HYDROmorphone (DILAUDID) injection, HYDROmorphone, HYDROmorphone, hydrOXYzine, ondansetron (ZOFRAN) IV, ondansetron **OR** ondansetron (ZOFRAN) IV, ondansetron **OR** [DISCONTINUED] ondansetron (ZOFRAN) IV, polyvinyl alcohol, promethazine, sodium chloride flush, sorbitol, zolpidem Allergies  Allergen Reactions  . Coumadin [Warfarin Sodium] Other (See Comments)    GI BLEED > DOSE RELATED   Review of Systems mild constipation, weakness, fatigue. Physical Exam  Frail elderly female, awake, alert, very pleasant, NAD.  Family at bedside.  Vital Signs: BP 115/65 (BP Location: Right Arm)   Pulse (!) 127   Temp 98.5 F (36.9 C) (Oral)   Resp (!) 26   Ht '5\' 4"'  (1.626 m)   Wt 64.8 kg (142 lb 13.7 oz)   SpO2 97%   BMI 24.52 kg/m  Pain Scale: 0-10 POSS *See Group Information*: 1-Acceptable,Awake and alert Pain Score: 0-No pain   SpO2: SpO2: 97 % O2 Device:SpO2: 97 % O2 Flow Rate: .O2 Flow Rate (L/min): 2 L/min  IO: Intake/output summary:   Intake/Output Summary (Last 24 hours) at 01/28/2018 1055 Last data filed at 01/28/2018 0200 Gross per 24 hour  Intake 245.2 ml  Output 0 ml  Net 245.2 ml    LBM:     Baseline Weight: Weight: 65.8 kg (145 lb) Most recent weight: Weight: 64.8 kg (142 lb 13.7 oz)     Palliative Assessment/Data: 30%     Time In: 10:00 Time Out: 10:50 Time Total:  50 min. Greater than 50%  of this time was spent counseling and coordinating care related to the above assessment and plan.  Signed by: Florentina Jenny, PA-C Palliative Medicine Pager: [phone]  Please contact Palliative Medicine Team phone at [phone] for questions and concerns.  For individual provider: See Shea Evans

## 2018-01-29 DIAGNOSIS — I4891 Unspecified atrial fibrillation: Secondary | ICD-10-CM | POA: Diagnosis not present

## 2018-01-29 DIAGNOSIS — E785 Hyperlipidemia, unspecified: Secondary | ICD-10-CM | POA: Diagnosis not present

## 2018-01-29 DIAGNOSIS — Z79891 Long term (current) use of opiate analgesic: Secondary | ICD-10-CM | POA: Diagnosis not present

## 2018-01-29 DIAGNOSIS — I132 Hypertensive heart and chronic kidney disease with heart failure and with stage 5 chronic kidney disease, or end stage renal disease: Secondary | ICD-10-CM | POA: Diagnosis not present

## 2018-01-29 DIAGNOSIS — N186 End stage renal disease: Secondary | ICD-10-CM | POA: Diagnosis not present

## 2018-01-29 DIAGNOSIS — I509 Heart failure, unspecified: Secondary | ICD-10-CM | POA: Diagnosis not present

## 2018-01-29 DIAGNOSIS — Z888 Allergy status to other drugs, medicaments and biological substances status: Secondary | ICD-10-CM | POA: Diagnosis not present

## 2018-01-29 DIAGNOSIS — Z993 Dependence on wheelchair: Secondary | ICD-10-CM | POA: Diagnosis not present

## 2018-01-29 DIAGNOSIS — I824Z3 Acute embolism and thrombosis of unspecified deep veins of distal lower extremity, bilateral: Secondary | ICD-10-CM | POA: Diagnosis not present

## 2018-01-29 DIAGNOSIS — Z9889 Other specified postprocedural states: Secondary | ICD-10-CM | POA: Diagnosis not present

## 2018-01-29 DIAGNOSIS — M62261 Nontraumatic ischemic infarction of muscle, right lower leg: Secondary | ICD-10-CM | POA: Diagnosis not present

## 2018-01-29 DIAGNOSIS — N2581 Secondary hyperparathyroidism of renal origin: Secondary | ICD-10-CM | POA: Diagnosis not present

## 2018-01-29 DIAGNOSIS — I998 Other disorder of circulatory system: Secondary | ICD-10-CM | POA: Diagnosis not present

## 2018-01-29 DIAGNOSIS — Z9049 Acquired absence of other specified parts of digestive tract: Secondary | ICD-10-CM | POA: Diagnosis not present

## 2018-01-29 DIAGNOSIS — Z992 Dependence on renal dialysis: Secondary | ICD-10-CM | POA: Diagnosis not present

## 2018-01-29 DIAGNOSIS — Z823 Family history of stroke: Secondary | ICD-10-CM | POA: Diagnosis not present

## 2018-01-29 DIAGNOSIS — Z8249 Family history of ischemic heart disease and other diseases of the circulatory system: Secondary | ICD-10-CM | POA: Diagnosis not present

## 2018-01-29 DIAGNOSIS — Z8711 Personal history of peptic ulcer disease: Secondary | ICD-10-CM | POA: Diagnosis not present

## 2018-01-29 DIAGNOSIS — Z7901 Long term (current) use of anticoagulants: Secondary | ICD-10-CM | POA: Diagnosis not present

## 2018-01-29 DIAGNOSIS — I999 Unspecified disorder of circulatory system: Secondary | ICD-10-CM | POA: Diagnosis not present

## 2018-01-29 DIAGNOSIS — I70263 Atherosclerosis of native arteries of extremities with gangrene, bilateral legs: Secondary | ICD-10-CM | POA: Diagnosis not present

## 2018-01-29 DIAGNOSIS — Z515 Encounter for palliative care: Secondary | ICD-10-CM | POA: Diagnosis not present

## 2018-01-29 DIAGNOSIS — M62262 Nontraumatic ischemic infarction of muscle, left lower leg: Secondary | ICD-10-CM | POA: Diagnosis not present

## 2018-01-29 MED ORDER — HYDROMORPHONE HCL 2 MG PO TABS: 2.0000 mg | ORAL_TABLET | ORAL | 0 refills | Status: DC | PRN

## 2018-01-29 MED ORDER — HALOPERIDOL 0.5 MG PO TABS: 0.5000 mg | ORAL_TABLET | ORAL | 0 refills | Status: DC | PRN

## 2018-01-29 MED ORDER — HYDROMORPHONE HCL 2 MG PO TABS: 2.0000 mg | ORAL_TABLET | ORAL | Status: DC | PRN

## 2018-01-29 MED ORDER — HYDROMORPHONE HCL 2 MG PO TABS: 2.0000 mg | ORAL_TABLET | ORAL | 0 refills | Status: AC | PRN

## 2018-01-29 MED ORDER — HYDROMORPHONE HCL 1 MG/ML IJ SOLN
0.5000 mg | INTRAMUSCULAR | Status: DC | PRN
Start: 2018-01-29 — End: 2018-01-29

## 2018-01-29 NOTE — Progress Notes (Signed)
Dr Catha Gosselin called in , cleared patient  for  discharge  with  Tunneled HD cath as per her  conversation  with [facility] from Palliative. ( See palliative note)

## 2018-01-29 NOTE — Progress Notes (Signed)
I spoke with Cassandra at [facility] [phone]) about the tunneled cath.  She checked with her coordinator.  It is ok to leave the permanent tunneled line in place and discharge Mrs. Patricia Ayala.  Norvel Richards, PA-C Palliative Medicine Pager: [phone]   No charge note.

## 2018-01-29 NOTE — Progress Notes (Signed)
Order to remove HDC Rt chest.  Permeant tunneled line that can not be removed bedside.  Recommend referral to IR.

## 2018-01-29 NOTE — Progress Notes (Signed)
Daily Progress Note   Patient Name: Patricia Ayala       Date: 01/29/2018 DOB: 1929/02/08  Age: 84 y.o. MRN#: [id number] Attending Physician: Edsel Petrin, DO Primary Care Physician: [name], MD Admit Date: 01/27/2018  Reason for Consultation/Follow-up: Hospice Evaluation, Non pain symptom management, Pain control and Psychosocial/spiritual support  Subjective: Patient is confused this morning.  Husband is at bedside ready for his wife to be discharged. He's brought clothes and requested that she be taken by ambulance to her daughter's house.  We called her daughter and clarified her address.  We discussed her confusion.  Husband states she has been intermittently confused.  I explained that this will likely get worse as she is not going to have dialysis any longer.  He understands.  He's concerned about being able to get her pain medicine as she needs it.  I offered reassurance that he is in good hands with Hospice.   Assessment: Very pleasant 84 yo female with ESRD and recent bilateral DVTs, who presented with obstructive gangrene. She is confused this morning. Family has opted for no surgery and no further dialysis.  They are taking her home with hospice.   Patient Profile/HPI:  84 y.o. female  with past medical history of ESRD on HD, PUD, Afib, aortic stenosis, CHF, and anxiety who was admitted on 01/27/2018 with bilateral foot pain.  She was admitted last month with large bilateral DVTs.  She was found to have obstructive PAD with gangrene.  Vascular performed and aortogram and determined that there are no reconstruction or surgical options for her.   Length of Stay: 1  Current Medications: Scheduled Meds:  . senna  1 tablet Oral QHS  . sodium chloride flush  3 mL  Intravenous Q12H    Continuous Infusions: . sodium chloride    . diltiazem (CARDIZEM) infusion 15 mg/hr (01/29/18 0438)    PRN Meds: sodium chloride, acetaminophen, ALPRAZolam, antiseptic oral rinse, calcium carbonate (dosed in mg elemental calcium), docusate sodium, feeding supplement (NEPRO CARB STEADY), glycopyrrolate **OR** glycopyrrolate **OR** glycopyrrolate, haloperidol **OR** haloperidol **OR** haloperidol lactate, HYDROmorphone (DILAUDID) injection, HYDROmorphone, hydrOXYzine, ondansetron (ZOFRAN) IV, ondansetron **OR** ondansetron (ZOFRAN) IV, ondansetron **OR** [DISCONTINUED] ondansetron (ZOFRAN) IV, polyvinyl alcohol, promethazine, sodium chloride flush, sorbitol, zolpidem  Physical Exam        Pleasant, confused, elderly female,  NAD CV irreg irreg Resp no distress Abdomen soft Extremities with bilateral swelling, 5th digit on LE is black and tender  Vital Signs: BP 137/74 (BP Location: Right Arm)   Pulse (!) 115   Temp 98 F (36.7 C) (Oral)   Resp 16   Ht 5\' 4"  (1.626 m)   Wt 62.9 kg (138 lb 10.7 oz)   SpO2 95%   BMI 23.80 kg/m  SpO2: SpO2: 95 % O2 Device: O2 Device: Room Air O2 Flow Rate: O2 Flow Rate (L/min): 2 L/min  Intake/output summary:   Intake/Output Summary (Last 24 hours) at 01/29/2018 0902 Last data filed at 01/29/2018 0600 Gross per 24 hour  Intake 697.5 ml  Output 0 ml  Net 697.5 ml   LBM: Last BM Date: (pta) Baseline Weight: Weight: 65.8 kg (145 lb) Most recent weight: Weight: 62.9 kg (138 lb 10.7 oz)       Palliative Assessment/Data:      Patient Active Problem List   Diagnosis Date Noted  . DNR (do not resuscitate)   . Comfort measures only status   . PAD (peripheral artery disease) (HCC) 01/27/2018  . Critical lower limb ischemia 01/27/2018  . Atherosclerosis of native arteries of the extremities with gangrene (HCC) 01/20/2018  . Goals of care, counseling/discussion   . Palliative care encounter   . DNR (do not resuscitate)  discussion   . Extensive DVT of lower extremity, bilateral (HCC) 11/24/2017  . Delirium, acute 05/04/2017  . Delirium 05/04/2017  . H/o Helicobacter pylori ab+   . H/o Chronic gastric ulcer with hemorrhage 01/2017   . Acute post-traumatic headache, not intractable   . Acute blood loss anemia   . Chronic anticoagulation   . Encounter for therapeutic drug monitoring 12/03/2016  . Generalized anxiety disorder 11/12/2016  . Atrial fibrillation with RVR (HCC) 11/01/2016  . Acute respiratory failure (HCC) 11/01/2016  . URI (upper respiratory infection) 10/30/2016  . ESRD (end stage renal disease) (HCC) 10/29/2016  . Benign essential HTN 10/29/2016  . HLD (hyperlipidemia) 10/29/2016  . Acute on chronic diastolic CHF (congestive heart failure) (HCC) 10/29/2016  . Pulmonary edema 09/26/2016  . Mechanical complication of other vascular device, implant, and graft 08/07/2013  . Anemia 02/11/2012    Palliative Care Plan    Recommendations/Plan:  Home with Hospice.    Dilaudid 2 mg q 2 hours PRN for pain  Would continue rate control medication, metoprolol, for comfort.  After discussing anticoagulant medications with my team and the attending physician we have decided against discharging the patient on coumadin.  Continue xanax   Goals of Care and Additional Recommendations:  Limitations on Scope of Treatment: Full Comfort Care  Code Status:  DNR  Prognosis:   days to less than 2 weeks.  Discharge Planning:  Home with Hospice  Care plan was discussed with PMT team, Attending physician, patient's husband.  Thank you for allowing the Palliative Medicine Team to assist in the care of this patient.  Total time spent:  35 min.     Greater than 50%  of this time was spent counseling and coordinating care related to the above assessment and plan.  Norvel Richards, PA-C Palliative Medicine  Please contact Palliative MedicineTeam phone at [phone] for questions and  concerns between 7 am - 7 pm.   Please see AMION for individual provider pager numbers.

## 2018-01-29 NOTE — Progress Notes (Signed)
   Patient is discharged home with palliative care today.  Vascular surgery available as needed.   Brandon C. Randie Heinz, MD [facility] [place] Office: [phone] Pager: [phone]

## 2018-01-29 NOTE — Progress Notes (Signed)
See full Discharge summary done on 01/28/2018 by Dr. Penny Pia.   Changes made to discharge:  Discussed with palliative care today, 01/29/2018, no longer recommending continuation of coumadin. Also discontinue haldol. Continue xanax at home. If patient needs Haldol, Hospice can arrange. Dilaudid prescription printed and signed.   Husband at bedside.  Patient seen and examined today. Patient currently stable for discharge. Blood pressure 137/74, pulse (!) 115, temperature 98 F (36.7 C), temperature source Oral, resp. rate 16, height 5\' 4"  (1.626 m), weight 62.9 kg (138 lb 10.7 oz), SpO2 95 %.  Allergies as of 01/29/2018      Reactions   Coumadin [warfarin Sodium] Other (See Comments)   GI BLEED > DOSE RELATED      Medication List    STOP taking these medications   diltiazem 60 MG tablet Commonly known as:  CARDIZEM   enoxaparin 80 MG/0.8ML injection Commonly known as:  LOVENOX   HYDROcodone-acetaminophen 10-325 MG tablet Commonly known as:  NORCO   hydrOXYzine 25 MG tablet Commonly known as:  ATARAX/VISTARIL   multivitamin Tabs tablet   promethazine 25 MG tablet Commonly known as:  PHENERGAN   sevelamer 800 MG tablet Commonly known as:  RENAGEL   warfarin 2.5 MG tablet Commonly known as:  COUMADIN     TAKE these medications   ALPRAZolam 0.5 MG tablet Commonly known as:  XANAX Take 0.5 mg by mouth 3 (three) times daily as needed for anxiety.   docusate sodium 283 MG enema Commonly known as:  ENEMEEZ Place 1 enema (283 mg total) rectally as needed for severe constipation.   glycopyrrolate 1 MG tablet Commonly known as:  ROBINUL Take 1 tablet (1 mg total) by mouth every 4 (four) hours as needed (excessive secretions).   HYDROmorphone 2 MG tablet Commonly known as:  DILAUDID Take 1 tablet (2 mg total) by mouth every 2 (two) hours as needed for moderate pain. What changed:    when to take this  reasons to take this   metoprolol tartrate 25 MG tablet Commonly  known as:  LOPRESSOR Take 2 tablets (50 mg total) by mouth 2 (two) times daily.       Time spent: 20 minutes  Yareni Creps D.O. [facility] Pager [phone]  If 7PM-7AM, please contact night-coverage [email or web address] Password [email or web address] 01/29/2018, 10:07 AM

## 2018-01-29 NOTE — Care Management Note (Addendum)
Case Management Note  Patient Details  Name: Patricia Ayala MRN: [id number] Date of Birth: 06-07-1929  Subjective/Objective:  [facility] for [facility] with [facility]. Hospice referral sent by previous CM on 01-28-18. CM spoke with Staff RN and stated pt was ready for transport. [facility] initially called at 11:40 am. CM did call and speak with [facility] and she is aware that pt will be seen today. Then Staff RN made CM aware that pt has a HD Catheter in place. Per Hospice HD Cath will need to be taken out prior to d/c to daughters home. Per family and MD- pt is DNR no further HD treatments. Daughter is aware of the delay. [facility] transport on hold until catheter can be removed.                  Action/Plan: Once HD catheter removed- pt will be ready for transport. No further needs from CM at this time.   Expected Discharge Date:  01/29/18               Expected Discharge Plan:  Home w Hospice Care  In-House Referral:  NA  Discharge planning Services  CM Consult  Post Acute Care Choice:  Hospice Choice offered to:  Adult Children, Spouse  DME Arranged:  N/A DME Agency:  NA  HH Arranged:  RN, Nurse's Aide HH Agency:  [facility]  Status of Service:  Completed, signed off  If discussed at [employer] of Stay Meetings, dates discussed:    Additional Comments: 1401 01-29-18 Patricia Bamberger, RN, BSN [phone] Staff RN to call [facility] and make family aware once picked up for transport home. No further needs from CM at this time.  Patricia Lewandowsky, RN 01/29/2018, 12:01 PM

## 2018-01-30 DIAGNOSIS — I10 Essential (primary) hypertension: Secondary | ICD-10-CM | POA: Diagnosis not present

## 2018-01-30 DIAGNOSIS — I4891 Unspecified atrial fibrillation: Secondary | ICD-10-CM | POA: Diagnosis not present

## 2018-01-30 DIAGNOSIS — I509 Heart failure, unspecified: Secondary | ICD-10-CM | POA: Diagnosis not present

## 2018-01-30 DIAGNOSIS — R2681 Unsteadiness on feet: Secondary | ICD-10-CM | POA: Diagnosis not present

## 2018-01-30 DIAGNOSIS — R531 Weakness: Secondary | ICD-10-CM | POA: Diagnosis not present

## 2018-02-26 DEATH — deceased

## 2018-03-12 ENCOUNTER — Ambulatory Visit: Payer: Self-pay | Admitting: *Deleted

## 2018-08-24 IMAGING — US US EXTREM LOW DUPLEX ARTERIAL BILAT
1 series · 13 of 25 positions shown · non-contrast
Comparison: None.

CLINICAL DATA: Bilateral lower extremity pain for 3 months with
walking.

EXAM:
BILATERAL LOWER EXTREMITY ARTERIAL DUPLEX SCAN
TECHNIQUE: Gray-scale sonography as well as color Doppler and duplex ultrasound
was performed to evaluate the arteries of both lower extremities.

[Series 1: us extrem low duplex arterial bilat · 0.08mm/px · 13 of 87 slices shown]
[im 1/87]
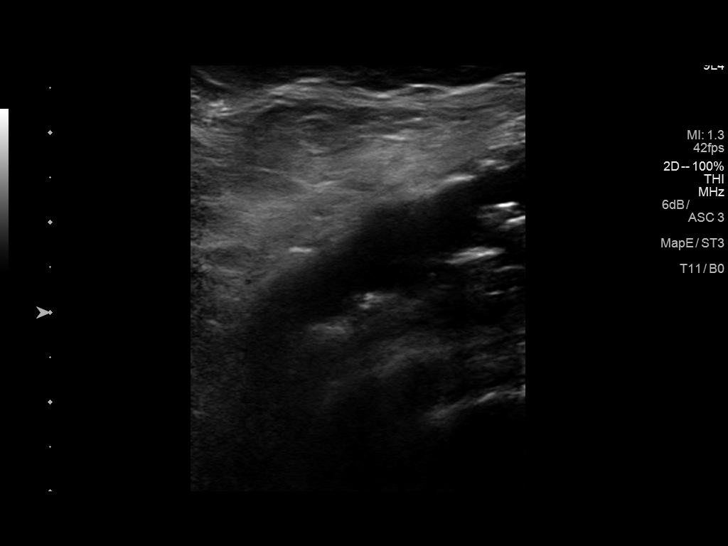
[im 8/87]
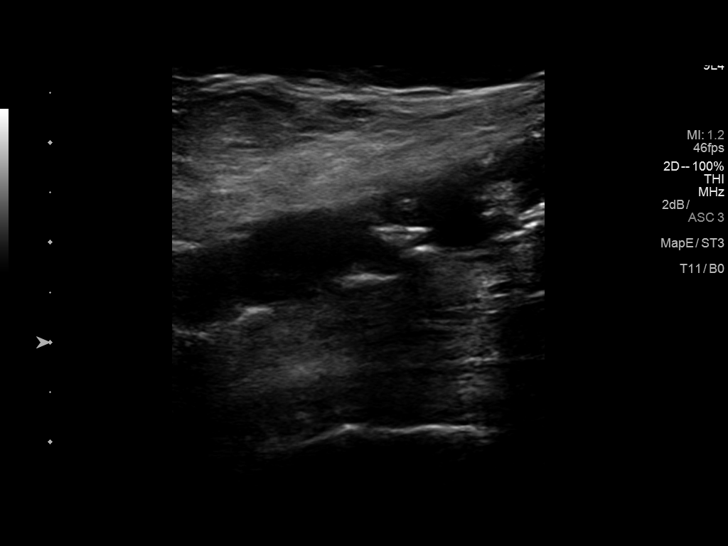
[im 15/87]
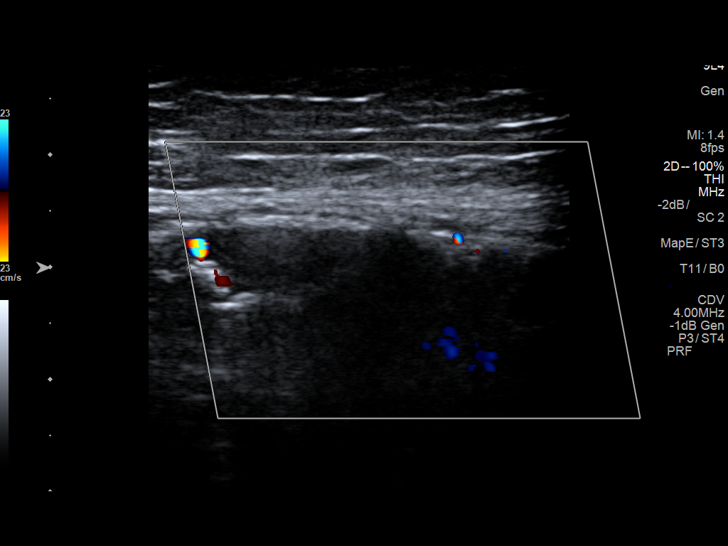
[im 22/87]
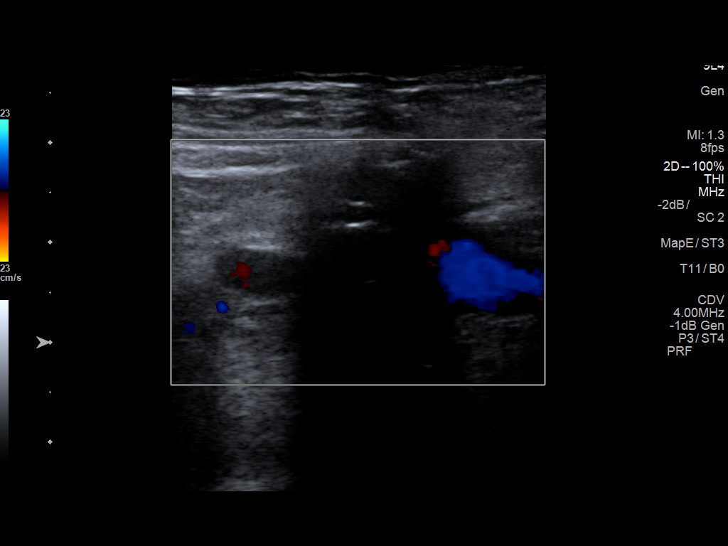
[im 29/87]
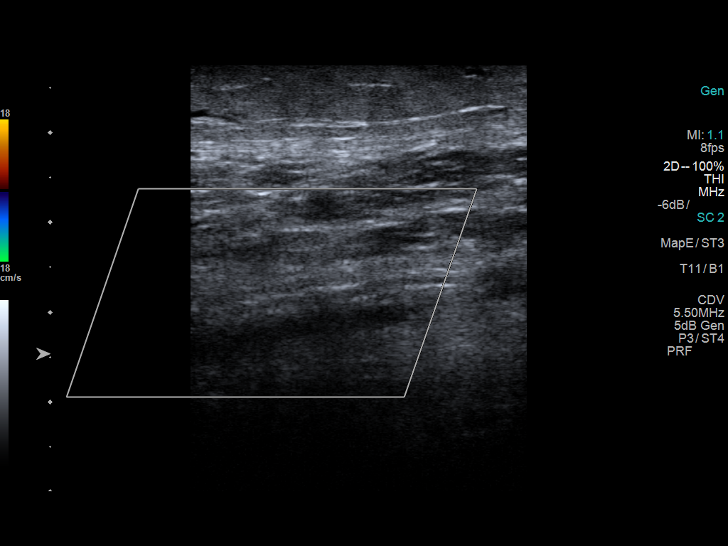
[im 36/87]
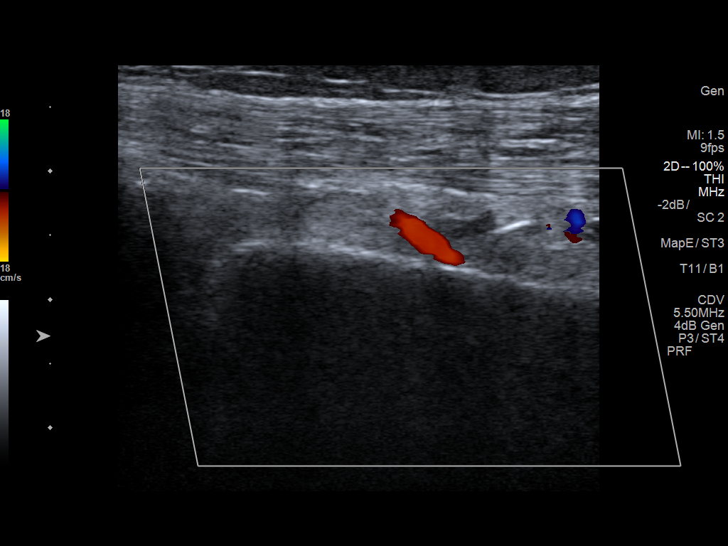
[im 44/87]
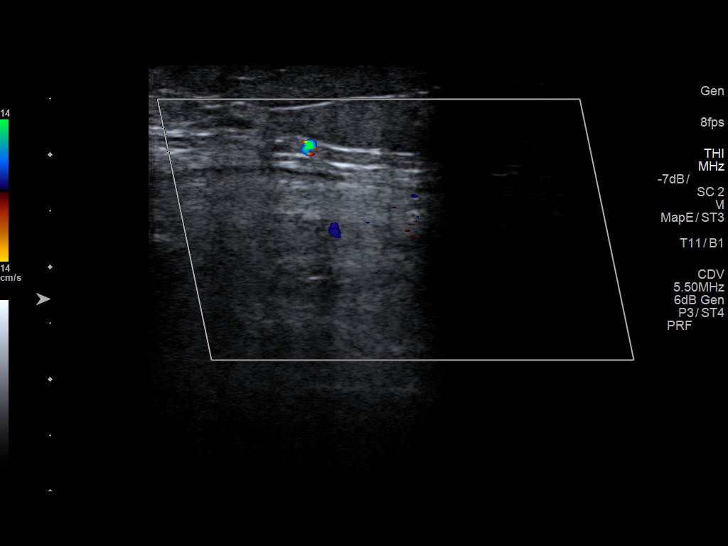
[im 51/87]
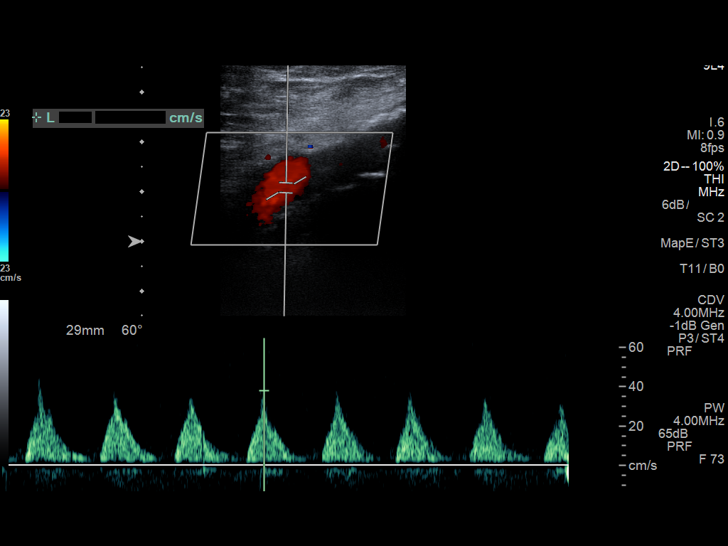
[im 58/87]
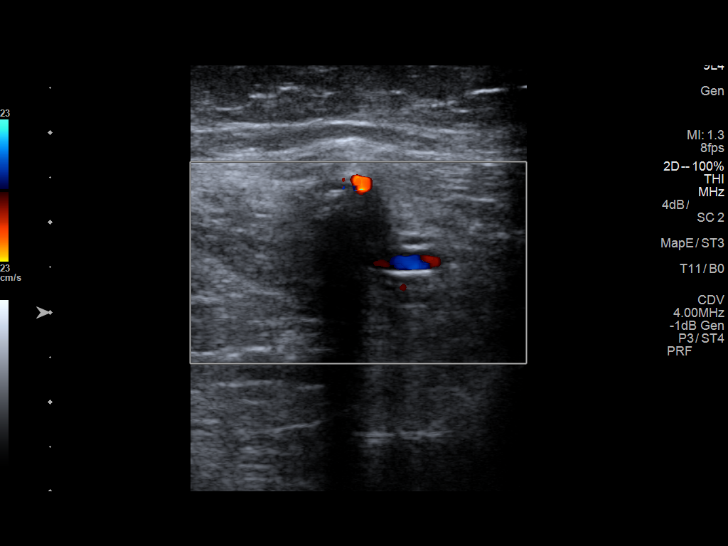
[im 65/87]
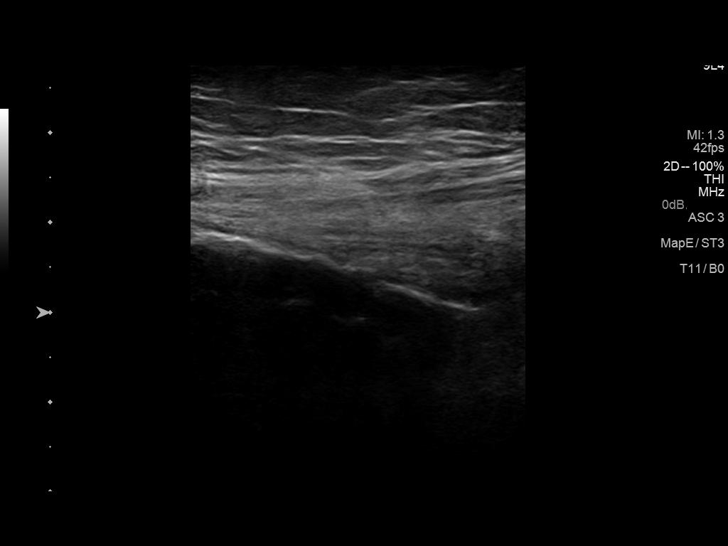
[im 72/87]
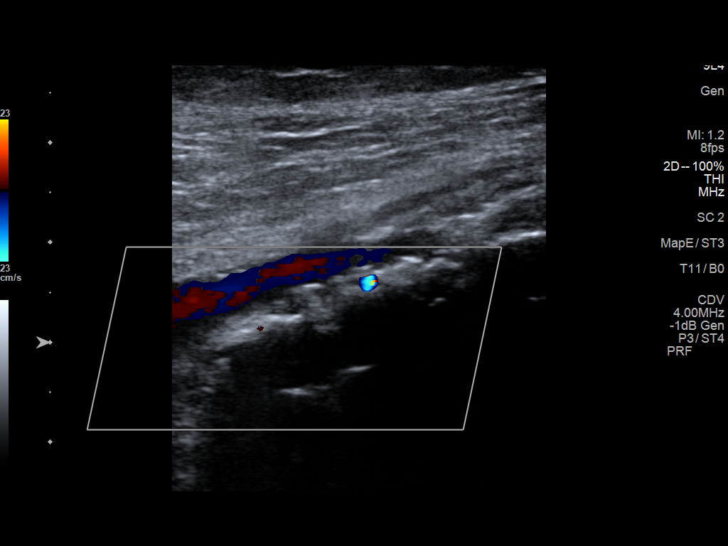
[im 79/87]
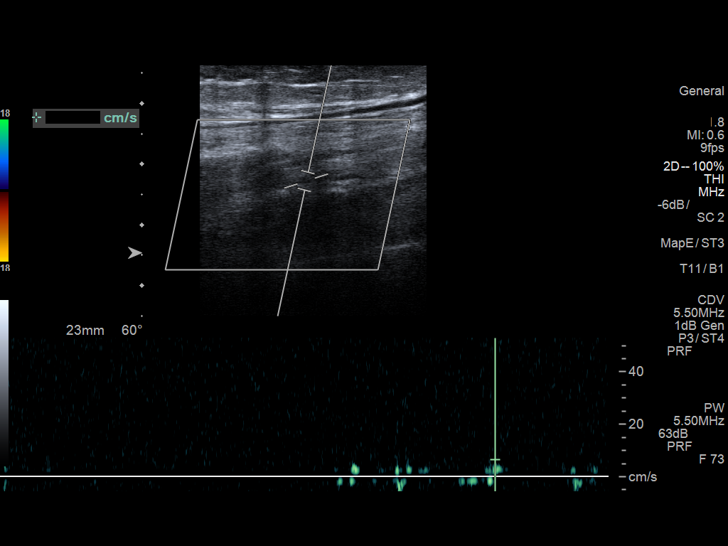
[im 87/87]
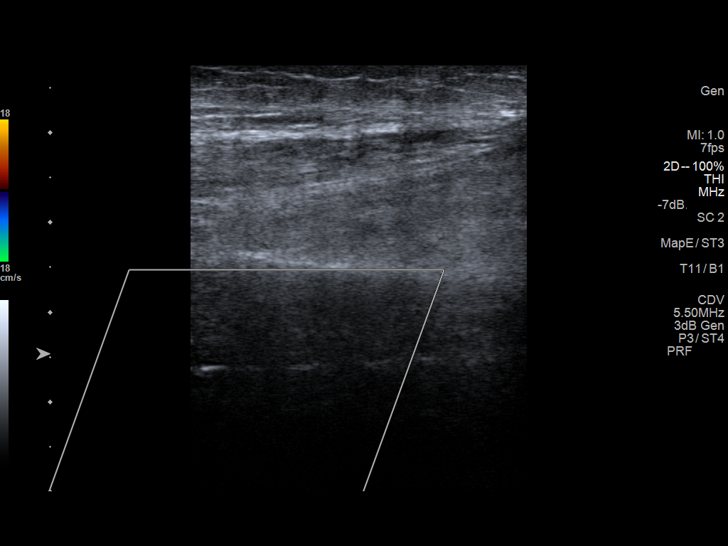

[13 of 25 positions shown; findings below may reference images not displayed]

FINDINGS: There is extensive plaque in the right common femoral artery which
is mixed. Maximal velocity in the common femoral artery is 186
cm/sec which is somewhat elevated. Doppler analysis demonstrates a
biphasic waveform. Color Doppler imaging demonstrates flow in the
proximal superficial femoral artery but occlusion in the mid and
distal superficial femoral artery. Flow was not visualized in the
popliteal artery. Monophasic flow was present in the anterior tibial
artery. There is no evidence of flow in the posterior tibial artery.
There is monophasic flow in the peroneal artery.

There is extensive plaque within the common femoral artery. Very
poor flow is seen at the periphery of the plaque. Flow was not
visualized in the superficial femoral or popliteal arteries.
Monophasic flow was present in the anterior tibial artery and
peroneal arteries. Flow was not visualize in the posterior tibial
artery.
IMPRESSION: Significant plaque with elevated velocity in the right common
femoral artery.

Right superficial femoral artery and popliteal artery occlusion are
suggested.

Monophasic waveforms in the right anterior tibial and peroneal
arteries. There is no flow in the posterior tibial artery.

Severe disease with subtotal occlusion of the left common femoral
artery.

Findings suggest occlusion of the left superficial femoral and
popliteal arteries.

Monophasic waveforms in the left anterior tibial and peroneal
arteries.

## 2018-10-15 IMAGING — DX DG CHEST 2V
2 series · 2 of 2 positions shown · non-contrast
Comparison: 09/24/2015

CLINICAL DATA: Feet swelling and sob worsening x 1 month/hx
pneumonia last [REDACTED]/htn/non smoker

EXAM:
CHEST  2 VIEW

[chest pa]
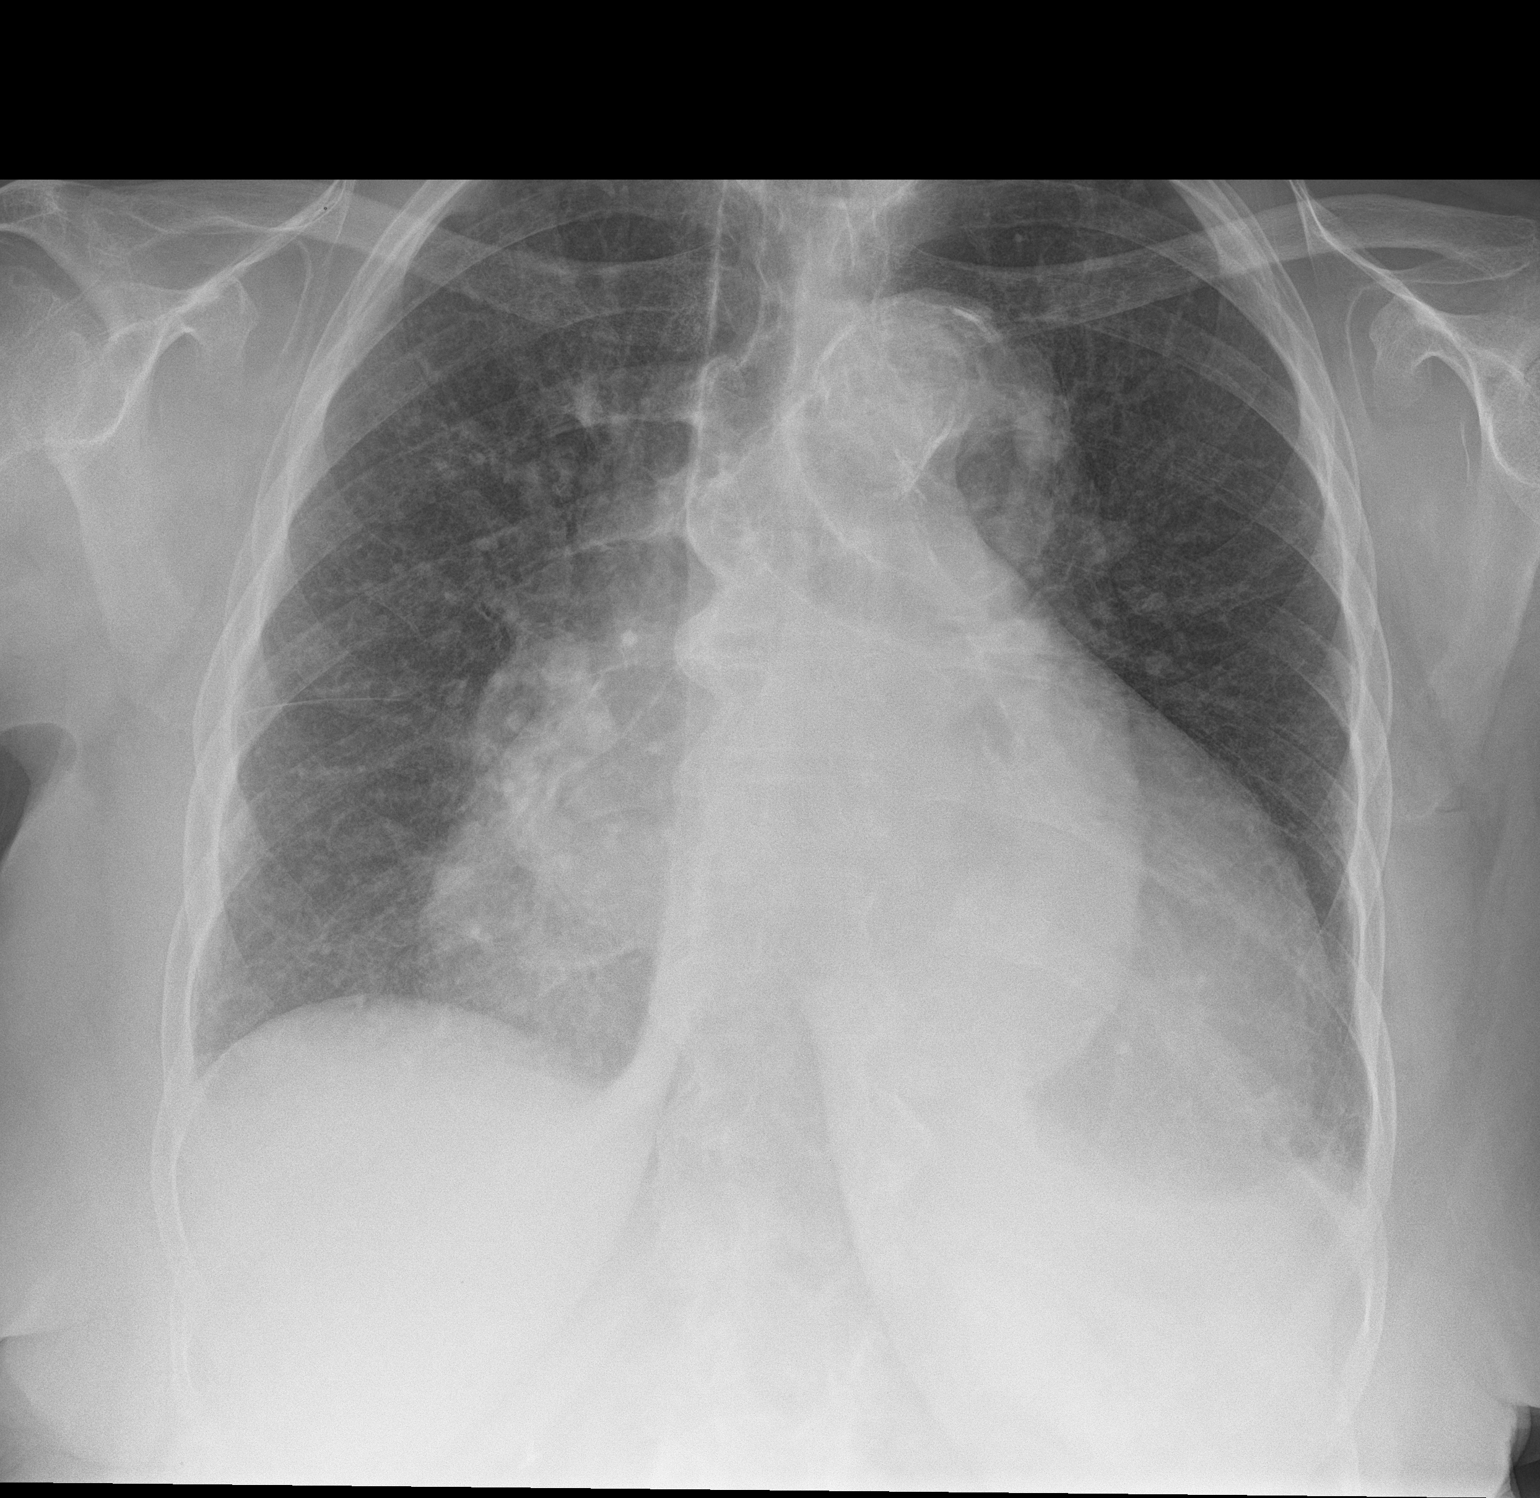

[chest lat]
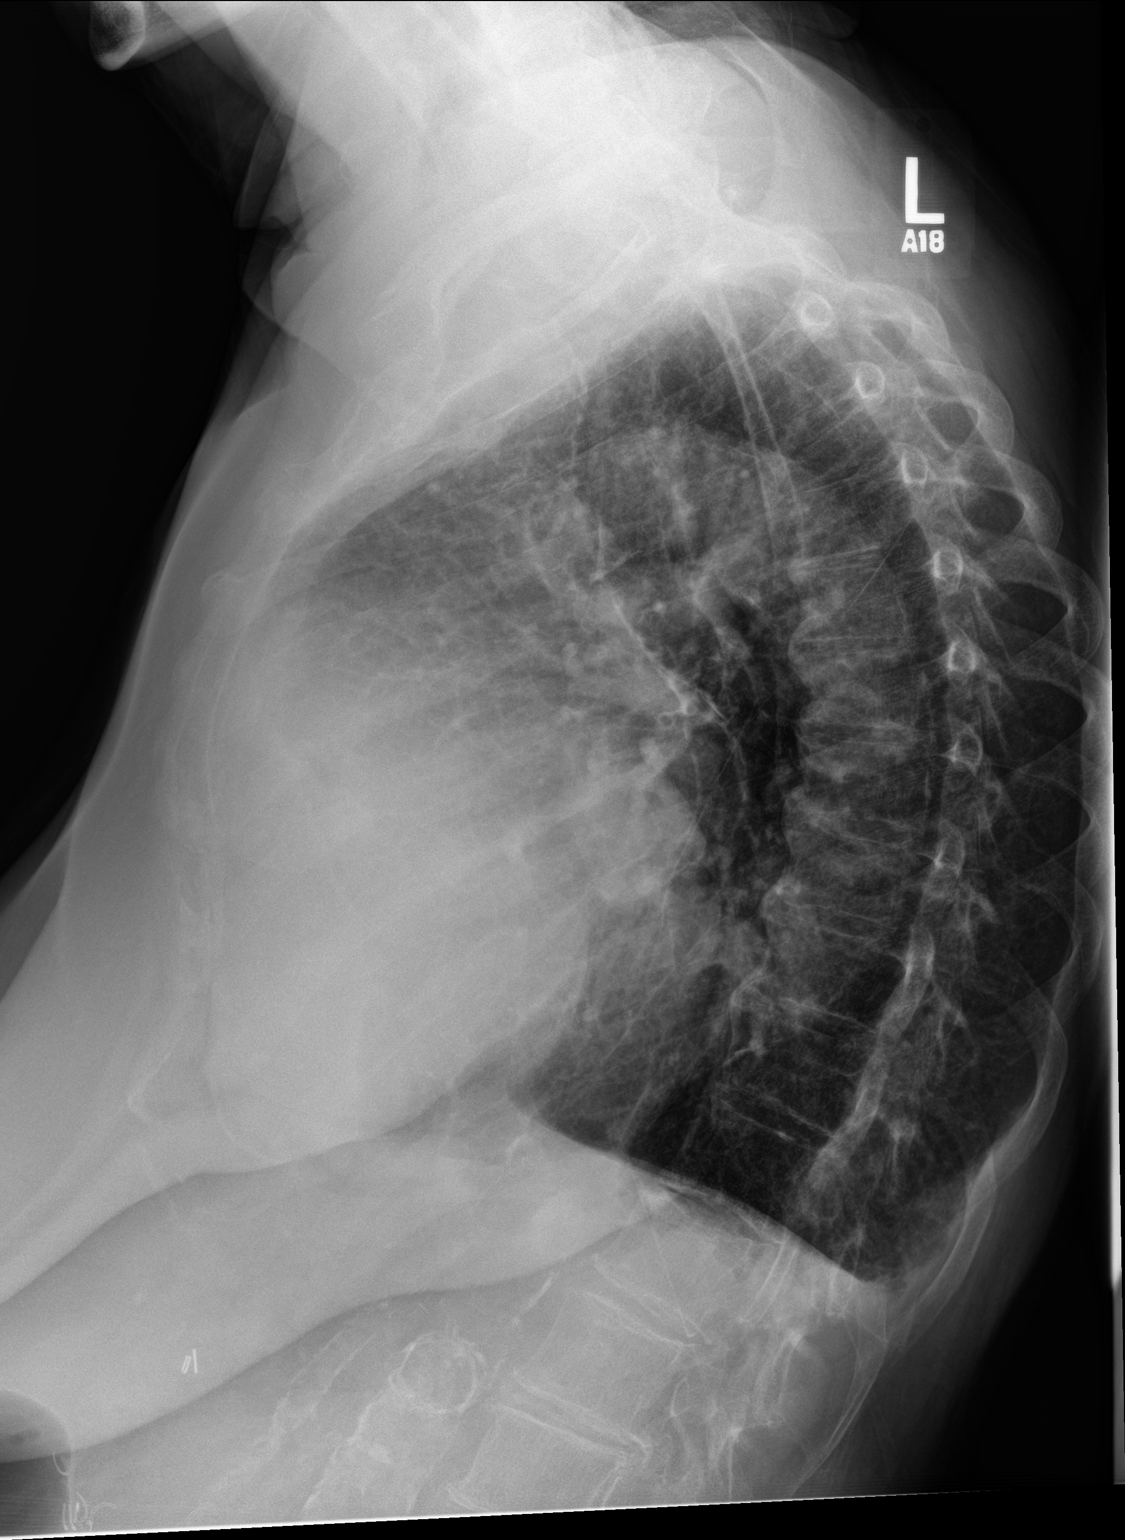

[2 of 2 positions shown; findings below may reference images not displayed]

FINDINGS: Moderate enlargement of the cardiopericardial silhouette similar to
the prior study.

No mediastinal or hilar masses or convincing adenopathy. Aorta is
uncoiled and tortuous.

Minimal left pleural effusion with associated subsegmental
atelectasis. No convincing right pleural effusion. No evidence of
pneumonia or pulmonary edema. No pneumothorax.

Skeletal structures are demineralized but grossly intact.
IMPRESSION: 1. No acute cardiopulmonary disease. No evidence of pneumonia or
pulmonary edema.
2. Moderate enlargement of the cardiopericardial silhouette.
3. Minimal left pleural effusion and lung base atelectasis improved
from the prior study.

## 2018-11-19 IMAGING — CR DG CHEST 1V PORT
1 series · 1 of 1 positions shown · non-contrast
Comparison: Radiographs August 22, 2016.

CLINICAL DATA: Shortness of breath.

EXAM:
PORTABLE CHEST 1 VIEW

[portable]
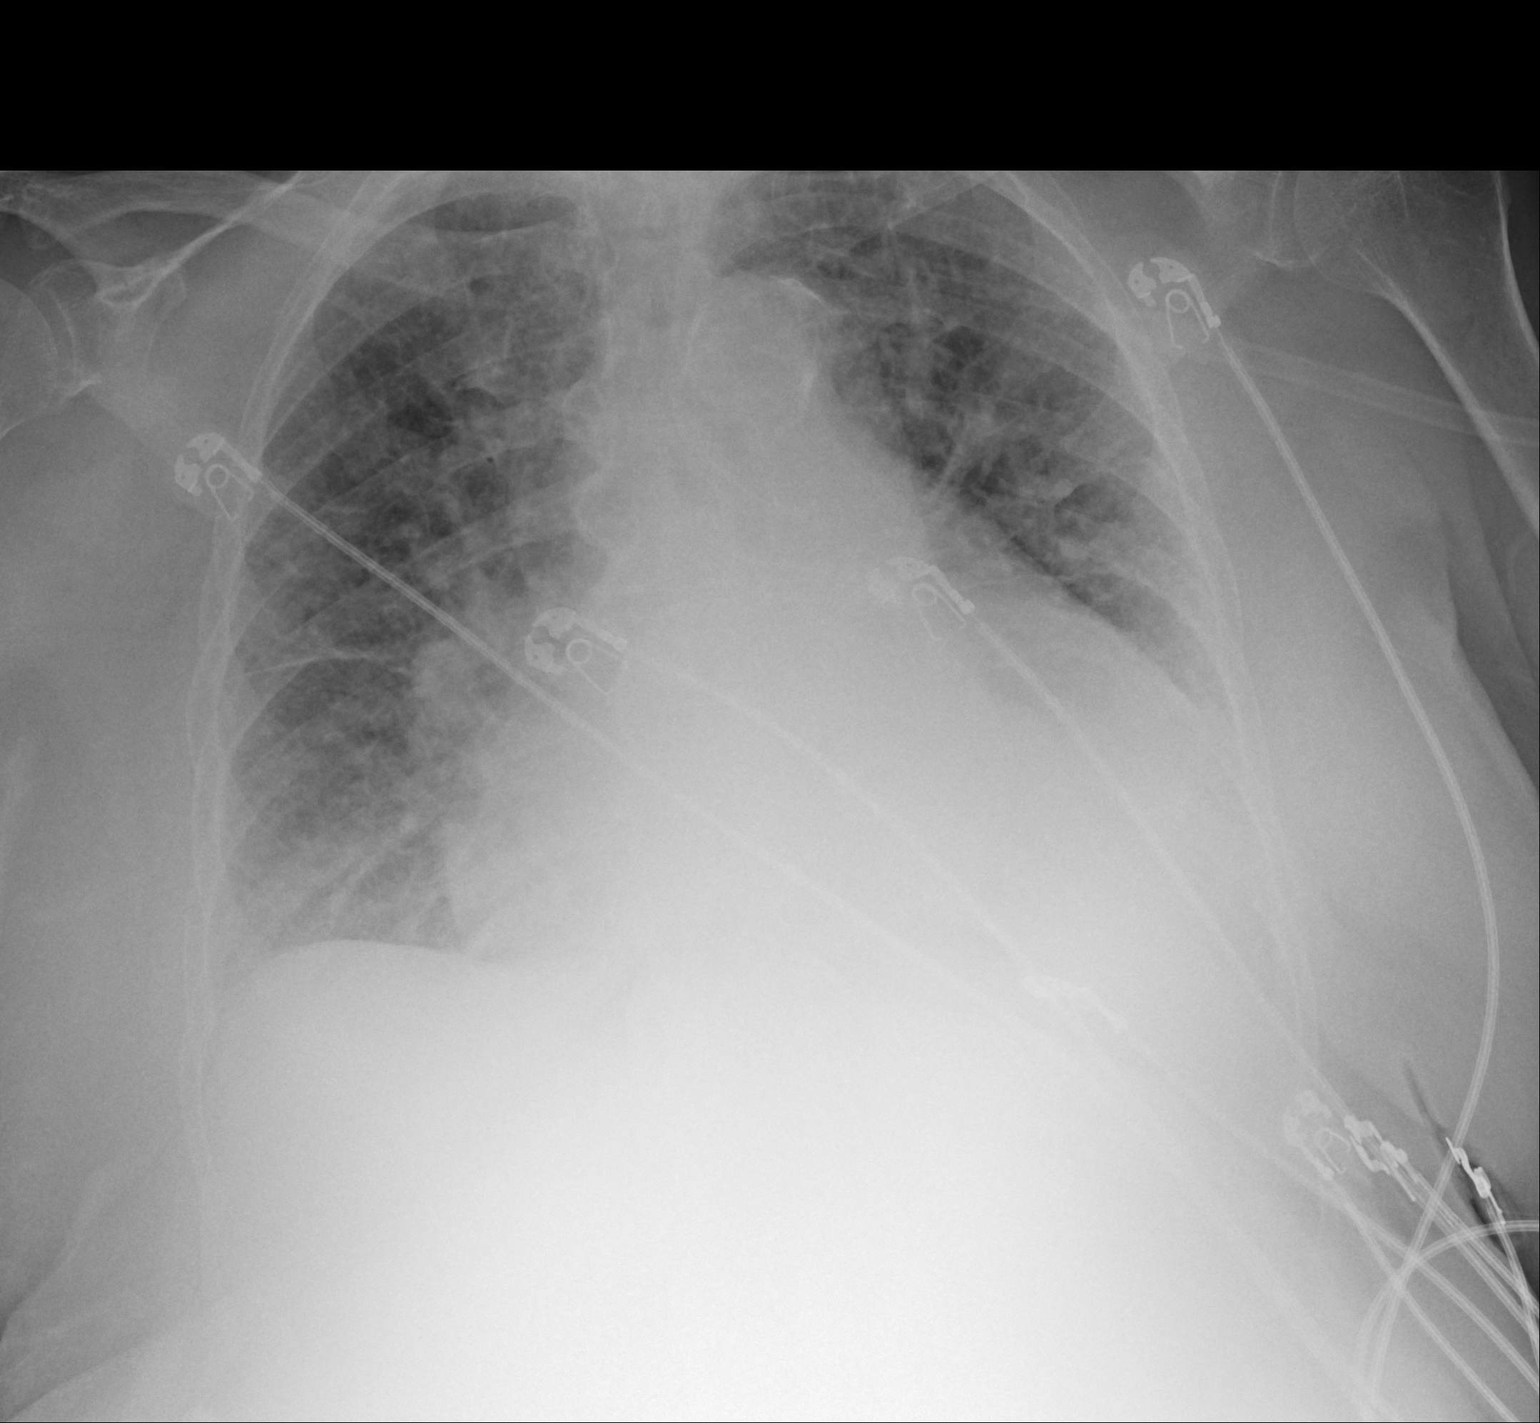

[1 of 1 positions shown; findings below may reference images not displayed]

FINDINGS: Stable cardiomegaly. Atherosclerosis of thoracic aorta is noted.
Diffuse bilateral interstitial densities are noted concerning for
pulmonary edema. No pneumothorax is noted. Bony thorax is
unremarkable.
IMPRESSION: Aortic atherosclerosis. Diffuse bilateral interstitial densities are
noted most consistent with pulmonary edema.

## 2019-04-13 IMAGING — CR DG CHEST 1V PORT
1 series · 1 of 1 positions shown · non-contrast
Comparison: Radiograph February 17, 2017.

CLINICAL DATA: Respiratory failure, shortness of breath.

EXAM:
PORTABLE CHEST 1 VIEW

[AP]
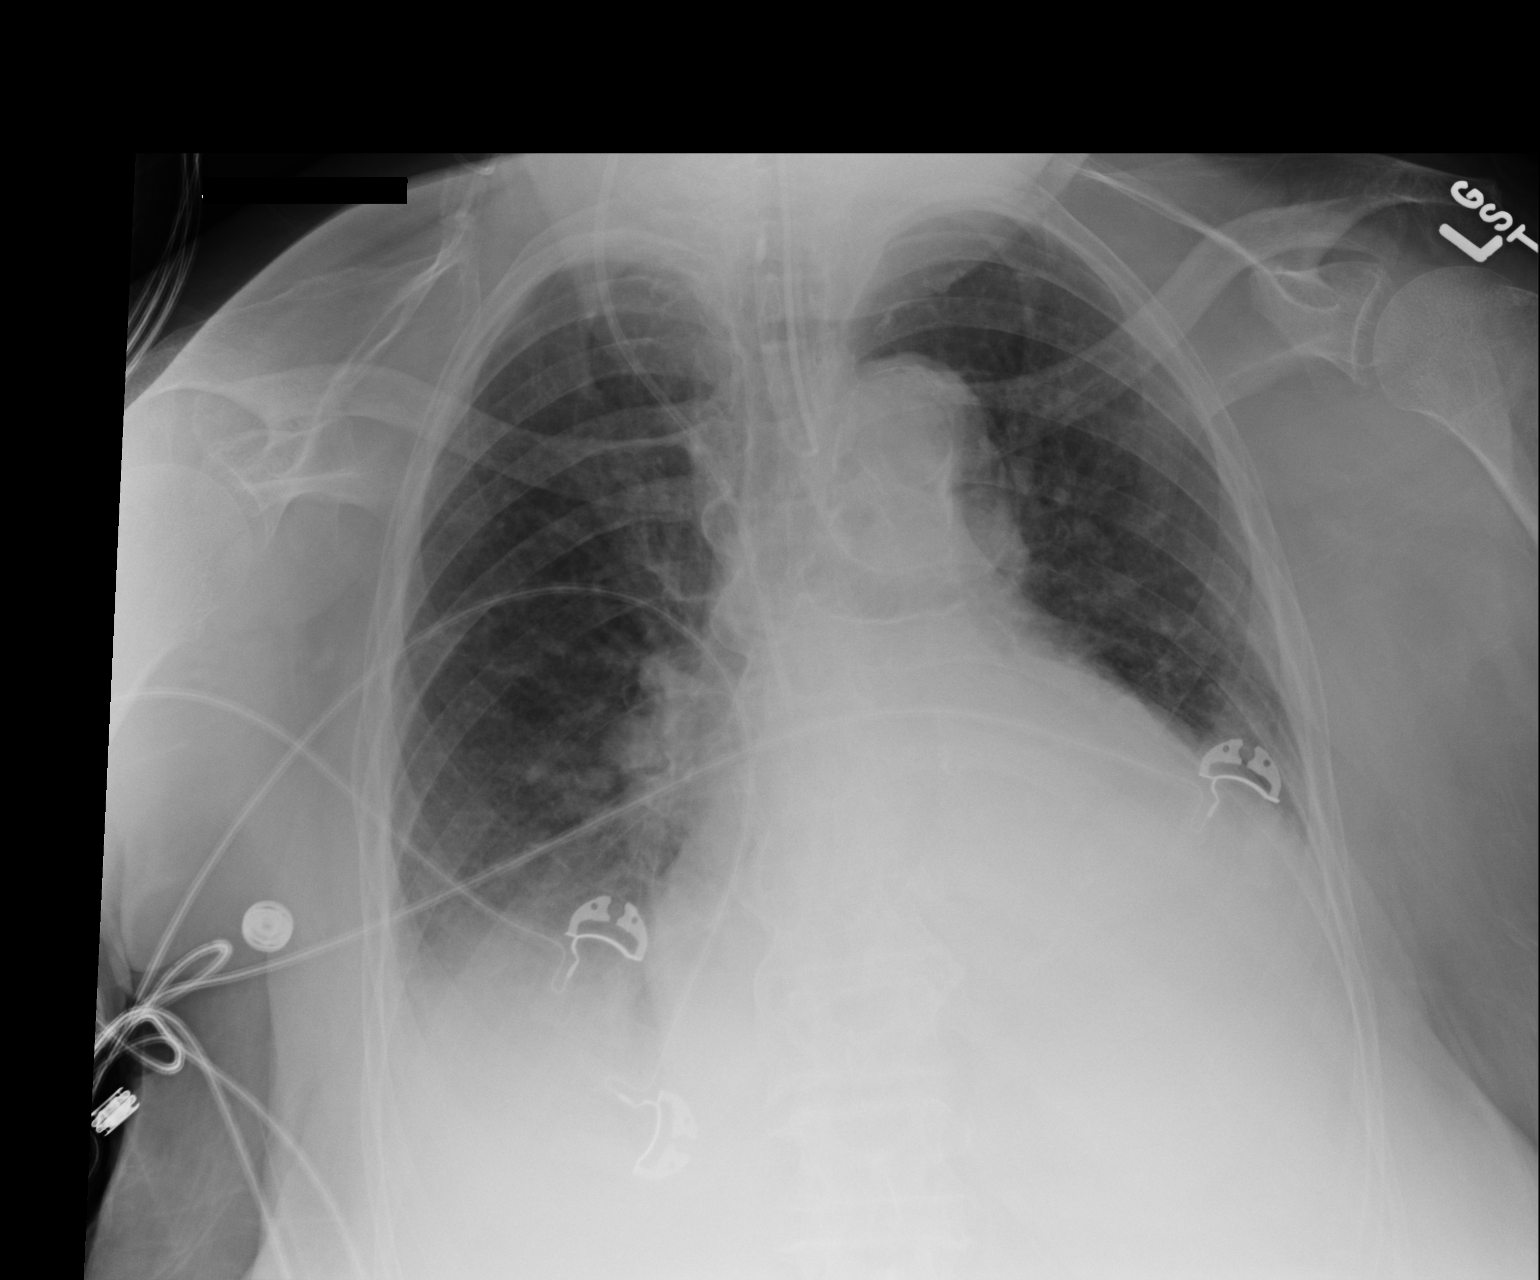

[1 of 1 positions shown; findings below may reference images not displayed]

FINDINGS: Stable cardiomegaly. Atherosclerosis of thoracic aorta is noted.
Endotracheal tube is in grossly good position. No pneumothorax is
noted. Right internal jugular catheter is unchanged. Mild bibasilar
edema or atelectasis is noted with possible associated pleural
effusions. Bony thorax is unremarkable.
IMPRESSION: Aortic atherosclerosis. Stable support apparatus. Mild bibasilar
atelectasis or edema with possible associated pleural effusions.

## 2019-07-03 IMAGING — DX DG CHEST 1V PORT
1 series · 1 of 1 positions shown · non-contrast
Comparison: Chest x-ray 05/04/2017

CLINICAL DATA: Dialysis catheter placement.

EXAM:
PORTABLE CHEST 1 VIEW

[chest ap]
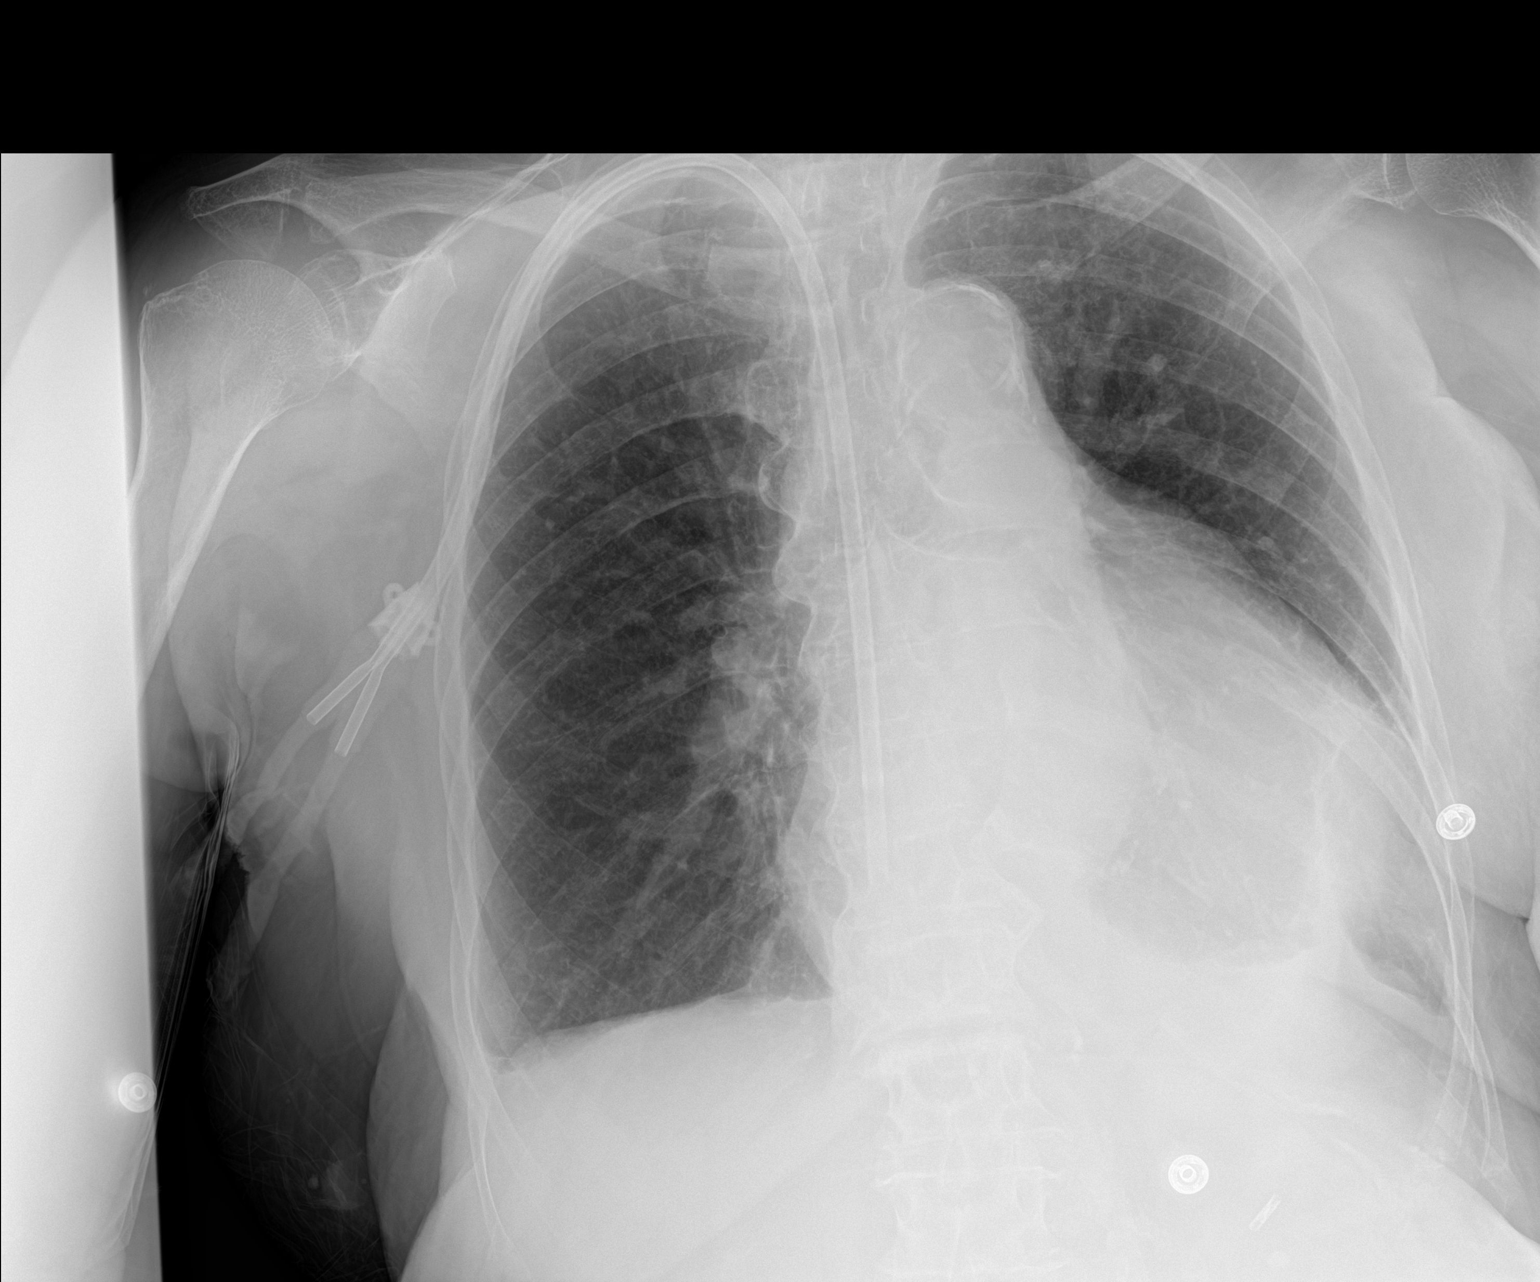

[1 of 1 positions shown; findings below may reference images not displayed]

FINDINGS: The heart is enlarged but stable. Stable tortuosity and
calcification of the thoracic aorta. Chronic right basilar scarring
changes. There is a right IJ double lumen dialysis catheter. The
distal tip is in the right atrium. No complicating features.
IMPRESSION: Right IJ double lumen dialysis catheter with the tip in the right
atrium. No complicating features.
